# Patient Record
Sex: Female | Born: 1945
Health system: Southern US, Community
[De-identification: ages and names within clinical notes are randomized; demographics above are authoritative.]

## PROBLEM LIST (undated history)

## (undated) DIAGNOSIS — Q2730 Arteriovenous malformation, site unspecified: Secondary | ICD-10-CM

## (undated) DIAGNOSIS — I10 Essential (primary) hypertension: Secondary | ICD-10-CM

## (undated) DIAGNOSIS — R569 Unspecified convulsions: Secondary | ICD-10-CM

## (undated) DIAGNOSIS — M1990 Unspecified osteoarthritis, unspecified site: Secondary | ICD-10-CM

## (undated) DIAGNOSIS — I350 Nonrheumatic aortic (valve) stenosis: Secondary | ICD-10-CM

## (undated) DIAGNOSIS — IMO0002 Reserved for concepts with insufficient information to code with codable children: Secondary | ICD-10-CM

## (undated) DIAGNOSIS — T8859XA Other complications of anesthesia, initial encounter: Secondary | ICD-10-CM

## (undated) DIAGNOSIS — R011 Cardiac murmur, unspecified: Secondary | ICD-10-CM

## (undated) DIAGNOSIS — R87619 Unspecified abnormal cytological findings in specimens from cervix uteri: Secondary | ICD-10-CM

## (undated) DIAGNOSIS — D509 Iron deficiency anemia, unspecified: Secondary | ICD-10-CM

## (undated) DIAGNOSIS — B20 Human immunodeficiency virus [HIV] disease: Secondary | ICD-10-CM

## (undated) DIAGNOSIS — K219 Gastro-esophageal reflux disease without esophagitis: Secondary | ICD-10-CM

## (undated) DIAGNOSIS — T4145XA Adverse effect of unspecified anesthetic, initial encounter: Secondary | ICD-10-CM

## (undated) DIAGNOSIS — Z21 Asymptomatic human immunodeficiency virus [HIV] infection status: Secondary | ICD-10-CM

## (undated) DIAGNOSIS — R001 Bradycardia, unspecified: Secondary | ICD-10-CM

## (undated) DIAGNOSIS — Z950 Presence of cardiac pacemaker: Secondary | ICD-10-CM

## (undated) DIAGNOSIS — I119 Hypertensive heart disease without heart failure: Secondary | ICD-10-CM

## (undated) DIAGNOSIS — R768 Other specified abnormal immunological findings in serum: Secondary | ICD-10-CM

## (undated) DIAGNOSIS — I2510 Atherosclerotic heart disease of native coronary artery without angina pectoris: Secondary | ICD-10-CM

## (undated) DIAGNOSIS — I5189 Other ill-defined heart diseases: Secondary | ICD-10-CM

## (undated) DIAGNOSIS — R0781 Pleurodynia: Secondary | ICD-10-CM

## (undated) DIAGNOSIS — I8390 Asymptomatic varicose veins of unspecified lower extremity: Secondary | ICD-10-CM

## (undated) DIAGNOSIS — I509 Heart failure, unspecified: Secondary | ICD-10-CM

## (undated) HISTORY — DX: Asymptomatic human immunodeficiency virus (hiv) infection status: Z21

## (undated) HISTORY — DX: Cardiac murmur, unspecified: R01.1

## (undated) HISTORY — DX: Reserved for concepts with insufficient information to code with codable children: IMO0002

## (undated) HISTORY — DX: Unspecified abnormal cytological findings in specimens from cervix uteri: R87.619

## (undated) HISTORY — DX: Other ill-defined heart diseases: I51.89

## (undated) HISTORY — DX: Atherosclerotic heart disease of native coronary artery without angina pectoris: I25.10

## (undated) HISTORY — DX: Hypertensive heart disease without heart failure: I11.9

## (undated) HISTORY — DX: Presence of cardiac pacemaker: Z95.0

## (undated) HISTORY — DX: Unspecified osteoarthritis, unspecified site: M19.90

## (undated) HISTORY — PX: ABDOMINAL HYSTERECTOMY: SHX81

## (undated) HISTORY — DX: Asymptomatic varicose veins of unspecified lower extremity: I83.90

## (undated) HISTORY — DX: Heart failure, unspecified: I50.9

## (undated) HISTORY — DX: Bradycardia, unspecified: R00.1

## (undated) HISTORY — DX: Unspecified convulsions: R56.9

## (undated) HISTORY — DX: Essential (primary) hypertension: I10

## (undated) HISTORY — DX: Human immunodeficiency virus (HIV) disease: B20

## (undated) HISTORY — DX: Arteriovenous malformation, site unspecified: Q27.30

## (undated) HISTORY — DX: Nonrheumatic aortic (valve) stenosis: I35.0

---

## 1995-12-08 ENCOUNTER — Encounter (INDEPENDENT_AMBULATORY_CARE_PROVIDER_SITE_OTHER): Payer: Self-pay | Admitting: *Deleted

## 1995-12-08 LAB — CONVERTED CEMR LAB
CD4 Count: 530 microliters
CD4 T Cell Abs: 530

## 2001-01-28 ENCOUNTER — Ambulatory Visit (HOSPITAL_COMMUNITY): Admission: RE | Admit: 2001-01-28 | Discharge: 2001-01-28 | Payer: Self-pay | Admitting: Infectious Diseases

## 2001-01-28 ENCOUNTER — Encounter: Admission: RE | Admit: 2001-01-28 | Discharge: 2001-01-28 | Payer: Self-pay | Admitting: Infectious Diseases

## 2001-01-28 ENCOUNTER — Encounter: Payer: Self-pay | Admitting: Infectious Diseases

## 2001-02-18 ENCOUNTER — Encounter: Admission: RE | Admit: 2001-02-18 | Discharge: 2001-02-18 | Payer: Self-pay | Admitting: Infectious Diseases

## 2001-02-26 ENCOUNTER — Encounter: Admission: RE | Admit: 2001-02-26 | Discharge: 2001-02-26 | Payer: Self-pay | Admitting: Hematology and Oncology

## 2001-04-17 ENCOUNTER — Encounter: Admission: RE | Admit: 2001-04-17 | Discharge: 2001-04-17 | Payer: Self-pay | Admitting: Infectious Diseases

## 2001-05-18 ENCOUNTER — Encounter: Admission: RE | Admit: 2001-05-18 | Discharge: 2001-05-18 | Payer: Self-pay | Admitting: Infectious Diseases

## 2001-08-20 ENCOUNTER — Ambulatory Visit (HOSPITAL_COMMUNITY): Admission: RE | Admit: 2001-08-20 | Discharge: 2001-08-20 | Payer: Self-pay | Admitting: Infectious Diseases

## 2001-08-20 ENCOUNTER — Encounter: Admission: RE | Admit: 2001-08-20 | Discharge: 2001-08-20 | Payer: Self-pay | Admitting: Infectious Diseases

## 2001-09-02 ENCOUNTER — Encounter: Admission: RE | Admit: 2001-09-02 | Discharge: 2001-09-02 | Payer: Self-pay | Admitting: Infectious Diseases

## 2002-07-28 ENCOUNTER — Ambulatory Visit (HOSPITAL_COMMUNITY): Admission: RE | Admit: 2002-07-28 | Discharge: 2002-07-28 | Payer: Self-pay | Admitting: Infectious Diseases

## 2002-07-28 ENCOUNTER — Encounter: Admission: RE | Admit: 2002-07-28 | Discharge: 2002-07-28 | Payer: Self-pay | Admitting: Infectious Diseases

## 2002-10-27 ENCOUNTER — Encounter: Admission: RE | Admit: 2002-10-27 | Discharge: 2002-10-27 | Payer: Self-pay | Admitting: Infectious Diseases

## 2002-10-27 ENCOUNTER — Ambulatory Visit (HOSPITAL_COMMUNITY): Admission: RE | Admit: 2002-10-27 | Discharge: 2002-10-27 | Payer: Self-pay | Admitting: Infectious Diseases

## 2002-12-27 ENCOUNTER — Encounter: Payer: Self-pay | Admitting: Emergency Medicine

## 2002-12-27 ENCOUNTER — Emergency Department (HOSPITAL_COMMUNITY): Admission: EM | Admit: 2002-12-27 | Discharge: 2002-12-27 | Payer: Self-pay | Admitting: Emergency Medicine

## 2003-04-14 ENCOUNTER — Ambulatory Visit (HOSPITAL_COMMUNITY): Admission: RE | Admit: 2003-04-14 | Discharge: 2003-04-14 | Payer: Self-pay | Admitting: Internal Medicine

## 2003-04-14 ENCOUNTER — Encounter: Payer: Self-pay | Admitting: Infectious Diseases

## 2003-04-14 ENCOUNTER — Encounter: Admission: RE | Admit: 2003-04-14 | Discharge: 2003-04-14 | Payer: Self-pay | Admitting: Infectious Diseases

## 2003-04-14 ENCOUNTER — Encounter: Payer: Self-pay | Admitting: Internal Medicine

## 2003-04-20 ENCOUNTER — Encounter: Admission: RE | Admit: 2003-04-20 | Discharge: 2003-04-20 | Payer: Self-pay | Admitting: Infectious Diseases

## 2003-12-14 ENCOUNTER — Encounter: Admission: RE | Admit: 2003-12-14 | Discharge: 2003-12-14 | Payer: Self-pay | Admitting: Infectious Diseases

## 2003-12-14 ENCOUNTER — Encounter (INDEPENDENT_AMBULATORY_CARE_PROVIDER_SITE_OTHER): Payer: Self-pay | Admitting: *Deleted

## 2003-12-14 ENCOUNTER — Ambulatory Visit (HOSPITAL_COMMUNITY): Admission: RE | Admit: 2003-12-14 | Discharge: 2003-12-14 | Payer: Self-pay | Admitting: Infectious Diseases

## 2003-12-14 LAB — CONVERTED CEMR LAB
CD4 Count: 470 microliters
HIV 1 RNA Quant: 399 copies/mL

## 2006-01-29 ENCOUNTER — Ambulatory Visit: Payer: Self-pay | Admitting: Infectious Diseases

## 2006-01-29 ENCOUNTER — Encounter (INDEPENDENT_AMBULATORY_CARE_PROVIDER_SITE_OTHER): Payer: Self-pay | Admitting: *Deleted

## 2006-01-29 LAB — CONVERTED CEMR LAB
CD4 Count: 630 microliters
HIV 1 RNA Quant: 49 copies/mL

## 2006-04-30 ENCOUNTER — Encounter (INDEPENDENT_AMBULATORY_CARE_PROVIDER_SITE_OTHER): Payer: Self-pay | Admitting: *Deleted

## 2006-04-30 ENCOUNTER — Encounter: Admission: RE | Admit: 2006-04-30 | Discharge: 2006-04-30 | Payer: Self-pay | Admitting: Infectious Diseases

## 2006-04-30 ENCOUNTER — Ambulatory Visit: Payer: Self-pay | Admitting: Infectious Diseases

## 2006-04-30 LAB — CONVERTED CEMR LAB
CD4 Count: 620 microliters
HIV 1 RNA Quant: 399 copies/mL

## 2007-01-21 ENCOUNTER — Encounter: Payer: Self-pay | Admitting: Infectious Diseases

## 2007-01-23 DIAGNOSIS — B20 Human immunodeficiency virus [HIV] disease: Secondary | ICD-10-CM | POA: Insufficient documentation

## 2007-01-23 DIAGNOSIS — F17200 Nicotine dependence, unspecified, uncomplicated: Secondary | ICD-10-CM | POA: Insufficient documentation

## 2007-01-23 DIAGNOSIS — I10 Essential (primary) hypertension: Secondary | ICD-10-CM | POA: Insufficient documentation

## 2007-01-23 DIAGNOSIS — H268 Other specified cataract: Secondary | ICD-10-CM | POA: Insufficient documentation

## 2007-01-23 DIAGNOSIS — K219 Gastro-esophageal reflux disease without esophagitis: Secondary | ICD-10-CM | POA: Insufficient documentation

## 2007-01-23 DIAGNOSIS — G2581 Restless legs syndrome: Secondary | ICD-10-CM | POA: Insufficient documentation

## 2007-01-26 DIAGNOSIS — N879 Dysplasia of cervix uteri, unspecified: Secondary | ICD-10-CM | POA: Insufficient documentation

## 2007-01-26 DIAGNOSIS — Z9079 Acquired absence of other genital organ(s): Secondary | ICD-10-CM | POA: Insufficient documentation

## 2007-01-26 DIAGNOSIS — K029 Dental caries, unspecified: Secondary | ICD-10-CM | POA: Insufficient documentation

## 2007-01-26 DIAGNOSIS — B182 Chronic viral hepatitis C: Secondary | ICD-10-CM | POA: Insufficient documentation

## 2007-02-02 ENCOUNTER — Encounter (INDEPENDENT_AMBULATORY_CARE_PROVIDER_SITE_OTHER): Payer: Self-pay | Admitting: *Deleted

## 2007-02-02 LAB — CONVERTED CEMR LAB

## 2007-02-11 ENCOUNTER — Ambulatory Visit: Payer: Self-pay | Admitting: Infectious Diseases

## 2007-02-11 ENCOUNTER — Encounter: Admission: RE | Admit: 2007-02-11 | Discharge: 2007-02-11 | Payer: Self-pay | Admitting: Infectious Diseases

## 2007-02-11 DIAGNOSIS — IMO0001 Reserved for inherently not codable concepts without codable children: Secondary | ICD-10-CM | POA: Insufficient documentation

## 2007-02-11 DIAGNOSIS — R569 Unspecified convulsions: Secondary | ICD-10-CM | POA: Insufficient documentation

## 2007-02-12 ENCOUNTER — Telehealth: Payer: Self-pay | Admitting: Infectious Diseases

## 2007-02-12 ENCOUNTER — Telehealth (INDEPENDENT_AMBULATORY_CARE_PROVIDER_SITE_OTHER): Payer: Self-pay | Admitting: *Deleted

## 2007-02-15 ENCOUNTER — Encounter (INDEPENDENT_AMBULATORY_CARE_PROVIDER_SITE_OTHER): Payer: Self-pay | Admitting: *Deleted

## 2007-04-20 ENCOUNTER — Encounter: Payer: Self-pay | Admitting: Infectious Diseases

## 2007-04-21 ENCOUNTER — Telehealth: Payer: Self-pay | Admitting: Infectious Diseases

## 2007-05-14 ENCOUNTER — Encounter: Payer: Self-pay | Admitting: Infectious Diseases

## 2007-06-05 ENCOUNTER — Encounter: Admission: RE | Admit: 2007-06-05 | Discharge: 2007-06-05 | Payer: Self-pay | Admitting: Nephrology

## 2007-06-10 ENCOUNTER — Encounter: Payer: Self-pay | Admitting: Infectious Diseases

## 2007-06-17 ENCOUNTER — Encounter: Admission: RE | Admit: 2007-06-17 | Discharge: 2007-06-17 | Payer: Self-pay | Admitting: Infectious Diseases

## 2007-06-17 ENCOUNTER — Ambulatory Visit: Payer: Self-pay | Admitting: Infectious Diseases

## 2007-06-17 LAB — CONVERTED CEMR LAB
ALT: 24 units/L (ref 0–35)
AST: 32 units/L (ref 0–37)
Albumin: 3.9 g/dL (ref 3.5–5.2)
Alkaline Phosphatase: 121 units/L — ABNORMAL HIGH (ref 39–117)
BUN: 19 mg/dL (ref 6–23)
CO2: 25 meq/L (ref 19–32)
Calcium: 9.4 mg/dL (ref 8.4–10.5)
Chloride: 102 meq/L (ref 96–112)
Cholesterol: 153 mg/dL (ref 0–200)
Creatinine, Ser: 0.84 mg/dL (ref 0.40–1.20)
Glucose, Bld: 92 mg/dL (ref 70–99)
HCT: 43.8 % (ref 36.0–46.0)
HDL: 61 mg/dL (ref >39)
HIV 1 RNA Quant: 105 copies/mL — ABNORMAL HIGH (ref <50)
HIV-1 RNA Quant, Log: 2.02 — ABNORMAL HIGH (ref <1.70)
Hemoglobin: 14.7 g/dL (ref 12.0–15.0)
LDL Cholesterol: 75 mg/dL (ref 0–99)
MCHC: 33.6 g/dL (ref 30.0–36.0)
MCV: 110.9 fL — ABNORMAL HIGH (ref 78.0–100.0)
Platelets: 349 10*3/uL (ref 150–400)
Potassium: 3.8 meq/L (ref 3.5–5.3)
RBC: 3.95 M/uL (ref 3.87–5.11)
RDW: 15.8 % — ABNORMAL HIGH (ref 11.5–14.0)
Sodium: 137 meq/L (ref 135–145)
Total Bilirubin: 0.5 mg/dL (ref 0.3–1.2)
Total CHOL/HDL Ratio: 2.5
Total Protein: 9.5 g/dL — ABNORMAL HIGH (ref 6.0–8.3)
Triglycerides: 83 mg/dL (ref <150)
VLDL: 17 mg/dL (ref 0–40)
WBC: 7 10*3/uL (ref 4.0–10.5)

## 2007-06-18 ENCOUNTER — Encounter: Payer: Self-pay | Admitting: Infectious Diseases

## 2007-06-18 LAB — CONVERTED CEMR LAB: CD4 Count: 420 microliters

## 2007-06-25 ENCOUNTER — Telehealth: Payer: Self-pay | Admitting: Infectious Diseases

## 2007-07-08 ENCOUNTER — Encounter: Payer: Self-pay | Admitting: Infectious Diseases

## 2007-10-14 ENCOUNTER — Telehealth: Payer: Self-pay | Admitting: Infectious Diseases

## 2007-10-20 ENCOUNTER — Emergency Department (HOSPITAL_COMMUNITY): Admission: EM | Admit: 2007-10-20 | Discharge: 2007-10-20 | Payer: Self-pay | Admitting: *Deleted

## 2007-10-21 ENCOUNTER — Telehealth: Payer: Self-pay

## 2007-11-16 ENCOUNTER — Telehealth: Payer: Self-pay | Admitting: Infectious Diseases

## 2007-12-08 ENCOUNTER — Encounter: Payer: Self-pay | Admitting: Infectious Diseases

## 2007-12-14 ENCOUNTER — Encounter (INDEPENDENT_AMBULATORY_CARE_PROVIDER_SITE_OTHER): Payer: Self-pay | Admitting: Licensed Clinical Social Worker

## 2007-12-14 ENCOUNTER — Encounter: Admission: RE | Admit: 2007-12-14 | Discharge: 2007-12-14 | Payer: Self-pay | Admitting: Infectious Diseases

## 2007-12-14 ENCOUNTER — Ambulatory Visit: Payer: Self-pay | Admitting: Infectious Diseases

## 2007-12-14 LAB — CONVERTED CEMR LAB
HCV Ab: UNDETERMINED
HCV Quantitative: 1780000 intl units/mL — ABNORMAL HIGH (ref <5)
HIV 1 RNA Quant: 167 copies/mL — ABNORMAL HIGH (ref <50)
HIV-1 RNA Quant, Log: 2.22 — ABNORMAL HIGH (ref <1.70)

## 2007-12-15 ENCOUNTER — Encounter: Payer: Self-pay | Admitting: Infectious Diseases

## 2007-12-15 LAB — CONVERTED CEMR LAB
ALT: 20 units/L (ref 0–35)
AST: 20 units/L (ref 0–37)
Albumin: 3.8 g/dL (ref 3.5–5.2)
Alkaline Phosphatase: 97 units/L (ref 39–117)
BUN: 23 mg/dL (ref 6–23)
Basophils Absolute: 0 10*3/uL (ref 0.0–0.1)
Basophils Relative: 0 % (ref 0–1)
CO2: 22 meq/L (ref 19–32)
Calcium: 9.3 mg/dL (ref 8.4–10.5)
Chloride: 104 meq/L (ref 96–112)
Cholesterol: 147 mg/dL (ref 0–200)
Creatinine, Ser: 0.87 mg/dL (ref 0.40–1.20)
Eosinophils Absolute: 0.1 10*3/uL (ref 0.0–0.7)
Eosinophils Relative: 1 % (ref 0–5)
Glucose, Bld: 95 mg/dL (ref 70–99)
HCT: 41.5 % (ref 36.0–46.0)
HCV Ab: POSITIVE — AB
HDL: 59 mg/dL (ref >39)
Hemoglobin: 14.4 g/dL (ref 12.0–15.0)
Hep A Total Ab: POSITIVE — AB
LDL Cholesterol: 55 mg/dL (ref 0–99)
Lymphocytes Relative: 57 % — ABNORMAL HIGH (ref 12–46)
Lymphs Abs: 3.9 10*3/uL (ref 0.7–4.0)
MCHC: 34.7 g/dL (ref 30.0–36.0)
MCV: 108.1 fL — ABNORMAL HIGH (ref 78.0–100.0)
Monocytes Absolute: 0.5 10*3/uL (ref 0.1–1.0)
Monocytes Relative: 8 % (ref 3–12)
Neutro Abs: 2.3 10*3/uL (ref 1.7–7.7)
Neutrophils Relative %: 34 % — ABNORMAL LOW (ref 43–77)
Platelets: 302 10*3/uL (ref 150–400)
Potassium: 3.4 meq/L — ABNORMAL LOW (ref 3.5–5.3)
RBC: 3.84 M/uL — ABNORMAL LOW (ref 3.87–5.11)
RDW: 14.4 % (ref 11.5–15.5)
Sodium: 137 meq/L (ref 135–145)
Total Bilirubin: 0.5 mg/dL (ref 0.3–1.2)
Total CHOL/HDL Ratio: 2.5
Total Protein: 8.7 g/dL — ABNORMAL HIGH (ref 6.0–8.3)
Triglycerides: 165 mg/dL — ABNORMAL HIGH (ref <150)
VLDL: 33 mg/dL (ref 0–40)
WBC: 6.8 10*3/uL (ref 4.0–10.5)

## 2007-12-16 ENCOUNTER — Telehealth: Payer: Self-pay | Admitting: Infectious Diseases

## 2008-02-16 ENCOUNTER — Telehealth: Payer: Self-pay | Admitting: Infectious Diseases

## 2008-02-17 ENCOUNTER — Encounter: Payer: Self-pay | Admitting: Infectious Diseases

## 2008-03-04 ENCOUNTER — Encounter: Payer: Self-pay | Admitting: Infectious Diseases

## 2008-03-04 ENCOUNTER — Ambulatory Visit: Payer: Self-pay | Admitting: Infectious Diseases

## 2008-03-04 DIAGNOSIS — IMO0002 Reserved for concepts with insufficient information to code with codable children: Secondary | ICD-10-CM | POA: Insufficient documentation

## 2008-03-14 LAB — CONVERTED CEMR LAB: Pap Smear: ABNORMAL

## 2008-03-16 ENCOUNTER — Encounter: Payer: Self-pay | Admitting: Infectious Diseases

## 2008-03-31 ENCOUNTER — Encounter: Payer: Self-pay | Admitting: Infectious Diseases

## 2008-04-07 ENCOUNTER — Encounter: Payer: Self-pay | Admitting: Infectious Diseases

## 2008-04-08 ENCOUNTER — Encounter: Admission: RE | Admit: 2008-04-08 | Discharge: 2008-04-08 | Payer: Self-pay | Admitting: Infectious Diseases

## 2008-04-08 ENCOUNTER — Ambulatory Visit: Payer: Self-pay | Admitting: Infectious Diseases

## 2008-04-08 ENCOUNTER — Ambulatory Visit: Payer: Self-pay | Admitting: Gynecology

## 2008-04-08 LAB — CONVERTED CEMR LAB
ALT: 31 units/L (ref 0–35)
AST: 33 units/L (ref 0–37)
Albumin: 3.6 g/dL (ref 3.5–5.2)
Alkaline Phosphatase: 119 units/L — ABNORMAL HIGH (ref 39–117)
BUN: 16 mg/dL (ref 6–23)
Basophils Absolute: 0 10*3/uL (ref 0.0–0.1)
Basophils Relative: 0 % (ref 0–1)
CO2: 25 meq/L (ref 19–32)
Calcium: 8.7 mg/dL (ref 8.4–10.5)
Chloride: 103 meq/L (ref 96–112)
Cholesterol: 128 mg/dL (ref 0–200)
Creatinine, Ser: 0.71 mg/dL (ref 0.40–1.20)
Eosinophils Absolute: 0 10*3/uL (ref 0.0–0.7)
Eosinophils Relative: 1 % (ref 0–5)
Glucose, Bld: 93 mg/dL (ref 70–99)
HCT: 38 % (ref 36.0–46.0)
HDL: 56 mg/dL (ref >39)
HIV 1 RNA Quant: 183 copies/mL — ABNORMAL HIGH (ref <50)
HIV-1 RNA Quant, Log: 2.26 — ABNORMAL HIGH (ref <1.70)
Hemoglobin: 12.4 g/dL (ref 12.0–15.0)
LDL Cholesterol: 57 mg/dL (ref 0–99)
Lymphocytes Relative: 50 % — ABNORMAL HIGH (ref 12–46)
Lymphs Abs: 4 10*3/uL (ref 0.7–4.0)
MCHC: 32.6 g/dL (ref 30.0–36.0)
MCV: 111.1 fL — ABNORMAL HIGH (ref 78.0–100.0)
Monocytes Absolute: 0.7 10*3/uL (ref 0.1–1.0)
Monocytes Relative: 9 % (ref 3–12)
Neutro Abs: 3.2 10*3/uL (ref 1.7–7.7)
Neutrophils Relative %: 41 % — ABNORMAL LOW (ref 43–77)
Platelets: 307 10*3/uL (ref 150–400)
Potassium: 3.8 meq/L (ref 3.5–5.3)
RBC: 3.42 M/uL — ABNORMAL LOW (ref 3.87–5.11)
RDW: 14.1 % (ref 11.5–15.5)
Sodium: 139 meq/L (ref 135–145)
Total Bilirubin: 0.8 mg/dL (ref 0.3–1.2)
Total CHOL/HDL Ratio: 2.3
Total Protein: 8.6 g/dL — ABNORMAL HIGH (ref 6.0–8.3)
Triglycerides: 75 mg/dL (ref <150)
VLDL: 15 mg/dL (ref 0–40)
WBC: 8 10*3/uL (ref 4.0–10.5)

## 2008-09-05 ENCOUNTER — Telehealth (INDEPENDENT_AMBULATORY_CARE_PROVIDER_SITE_OTHER): Payer: Self-pay | Admitting: *Deleted

## 2008-09-26 ENCOUNTER — Ambulatory Visit: Payer: Self-pay | Admitting: Infectious Diseases

## 2008-09-26 LAB — CONVERTED CEMR LAB
ALT: 19 units/L (ref 0–35)
AST: 25 units/L (ref 0–37)
Albumin: 3.7 g/dL (ref 3.5–5.2)
Alkaline Phosphatase: 109 units/L (ref 39–117)
BUN: 16 mg/dL (ref 6–23)
Basophils Absolute: 0 10*3/uL (ref 0.0–0.1)
Basophils Relative: 0 % (ref 0–1)
CO2: 23 meq/L (ref 19–32)
Calcium: 8.7 mg/dL (ref 8.4–10.5)
Chloride: 109 meq/L (ref 96–112)
Creatinine, Ser: 1.05 mg/dL (ref 0.40–1.20)
Eosinophils Absolute: 0.1 10*3/uL (ref 0.0–0.7)
Eosinophils Relative: 1 % (ref 0–5)
Glucose, Bld: 104 mg/dL — ABNORMAL HIGH (ref 70–99)
HCT: 37.5 % (ref 36.0–46.0)
HIV 1 RNA Quant: 192 copies/mL — ABNORMAL HIGH (ref <50)
HIV-1 RNA Quant, Log: 2.28 — ABNORMAL HIGH (ref <1.70)
Hemoglobin: 11.7 g/dL — ABNORMAL LOW (ref 12.0–15.0)
Lymphocytes Relative: 58 % — ABNORMAL HIGH (ref 12–46)
Lymphs Abs: 3.8 10*3/uL (ref 0.7–4.0)
MCHC: 31.2 g/dL (ref 30.0–36.0)
MCV: 98.4 fL (ref 78.0–100.0)
Monocytes Absolute: 0.5 10*3/uL (ref 0.1–1.0)
Monocytes Relative: 7 % (ref 3–12)
Neutro Abs: 2.2 10*3/uL (ref 1.7–7.7)
Neutrophils Relative %: 34 % — ABNORMAL LOW (ref 43–77)
Platelets: 369 10*3/uL (ref 150–400)
Potassium: 3.9 meq/L (ref 3.5–5.3)
RBC: 3.81 M/uL — ABNORMAL LOW (ref 3.87–5.11)
RDW: 16 % — ABNORMAL HIGH (ref 11.5–15.5)
Sodium: 143 meq/L (ref 135–145)
Total Bilirubin: 0.4 mg/dL (ref 0.3–1.2)
Total Protein: 8.4 g/dL — ABNORMAL HIGH (ref 6.0–8.3)
WBC: 6.5 10*3/uL (ref 4.0–10.5)

## 2008-10-10 ENCOUNTER — Ambulatory Visit: Payer: Self-pay | Admitting: Infectious Diseases

## 2008-11-08 ENCOUNTER — Encounter: Payer: Self-pay | Admitting: Infectious Diseases

## 2008-11-16 ENCOUNTER — Telehealth: Payer: Self-pay | Admitting: Infectious Diseases

## 2008-12-28 ENCOUNTER — Telehealth: Payer: Self-pay

## 2009-03-27 ENCOUNTER — Telehealth: Payer: Self-pay

## 2009-06-05 ENCOUNTER — Ambulatory Visit: Payer: Self-pay | Admitting: Internal Medicine

## 2009-06-05 ENCOUNTER — Encounter: Payer: Self-pay | Admitting: Infectious Diseases

## 2009-06-05 LAB — CONVERTED CEMR LAB
ALT: 12 units/L (ref 0–35)
AST: 20 units/L (ref 0–37)
Albumin: 3.6 g/dL (ref 3.5–5.2)
Alkaline Phosphatase: 85 units/L (ref 39–117)
BUN: 16 mg/dL (ref 6–23)
Basophils Absolute: 0 10*3/uL (ref 0.0–0.1)
Basophils Relative: 0 % (ref 0–1)
CO2: 24 meq/L (ref 19–32)
Calcium: 9 mg/dL (ref 8.4–10.5)
Chloride: 107 meq/L (ref 96–112)
Cholesterol: 130 mg/dL (ref 0–200)
Creatinine, Ser: 0.74 mg/dL (ref 0.40–1.20)
Eosinophils Absolute: 0.1 10*3/uL (ref 0.0–0.7)
Eosinophils Relative: 1 % (ref 0–5)
Glucose, Bld: 101 mg/dL — ABNORMAL HIGH (ref 70–99)
HCT: 33.9 % — ABNORMAL LOW (ref 36.0–46.0)
HDL: 67 mg/dL (ref >39)
HIV 1 RNA Quant: 275 copies/mL — ABNORMAL HIGH (ref <48)
HIV-1 RNA Quant, Log: 2.44 — ABNORMAL HIGH (ref <1.68)
Hemoglobin: 10.7 g/dL — ABNORMAL LOW (ref 12.0–15.0)
LDL Cholesterol: 54 mg/dL (ref 0–99)
Lymphocytes Relative: 60 % — ABNORMAL HIGH (ref 12–46)
Lymphs Abs: 3.5 10*3/uL (ref 0.7–4.0)
MCHC: 31.6 g/dL (ref 30.0–36.0)
MCV: 88.1 fL (ref 78.0–100.0)
Monocytes Absolute: 0.5 10*3/uL (ref 0.1–1.0)
Monocytes Relative: 8 % (ref 3–12)
Neutro Abs: 1.8 10*3/uL (ref 1.7–7.7)
Neutrophils Relative %: 31 % — ABNORMAL LOW (ref 43–77)
Platelets: 313 10*3/uL (ref 150–400)
Potassium: 4 meq/L (ref 3.5–5.3)
RBC: 3.85 M/uL — ABNORMAL LOW (ref 3.87–5.11)
RDW: 18.5 % — ABNORMAL HIGH (ref 11.5–15.5)
Sodium: 140 meq/L (ref 135–145)
Total Bilirubin: 0.4 mg/dL (ref 0.3–1.2)
Total CHOL/HDL Ratio: 1.9
Total Protein: 8.1 g/dL (ref 6.0–8.3)
Triglycerides: 45 mg/dL (ref <150)
VLDL: 9 mg/dL (ref 0–40)
WBC: 5.7 10*3/uL (ref 4.0–10.5)

## 2009-06-19 ENCOUNTER — Ambulatory Visit: Payer: Self-pay | Admitting: Infectious Diseases

## 2009-06-19 ENCOUNTER — Telehealth (INDEPENDENT_AMBULATORY_CARE_PROVIDER_SITE_OTHER): Payer: Self-pay | Admitting: *Deleted

## 2009-06-21 ENCOUNTER — Ambulatory Visit: Payer: Self-pay | Admitting: Infectious Diseases

## 2009-06-22 ENCOUNTER — Encounter: Payer: Self-pay | Admitting: Infectious Diseases

## 2009-06-26 ENCOUNTER — Telehealth: Payer: Self-pay | Admitting: Infectious Diseases

## 2009-07-14 ENCOUNTER — Telehealth: Payer: Self-pay | Admitting: Infectious Diseases

## 2009-09-12 ENCOUNTER — Encounter: Payer: Self-pay | Admitting: Infectious Diseases

## 2009-09-15 ENCOUNTER — Telehealth: Payer: Self-pay | Admitting: Infectious Diseases

## 2009-10-02 ENCOUNTER — Ambulatory Visit: Payer: Self-pay | Admitting: Infectious Diseases

## 2009-10-02 LAB — CONVERTED CEMR LAB
ALT: 17 units/L (ref 0–35)
AST: 23 units/L (ref 0–37)
Albumin: 3.5 g/dL (ref 3.5–5.2)
Alkaline Phosphatase: 89 units/L (ref 39–117)
BUN: 20 mg/dL (ref 6–23)
Basophils Absolute: 0 10*3/uL (ref 0.0–0.1)
Basophils Relative: 0 % (ref 0–1)
CO2: 26 meq/L (ref 19–32)
Calcium: 9.2 mg/dL (ref 8.4–10.5)
Chloride: 104 meq/L (ref 96–112)
Creatinine, Ser: 0.77 mg/dL (ref 0.40–1.20)
Eosinophils Absolute: 0.1 10*3/uL (ref 0.0–0.7)
Eosinophils Relative: 2 % (ref 0–5)
Glucose, Bld: 92 mg/dL (ref 70–99)
HCT: 34.3 % — ABNORMAL LOW (ref 36.0–46.0)
HIV 1 RNA Quant: 369 copies/mL — ABNORMAL HIGH (ref <48)
HIV-1 RNA Quant, Log: 2.57 — ABNORMAL HIGH (ref <1.68)
Hemoglobin: 10.7 g/dL — ABNORMAL LOW (ref 12.0–15.0)
Lymphocytes Relative: 46 % (ref 12–46)
Lymphs Abs: 3.1 10*3/uL (ref 0.7–4.0)
MCHC: 31.2 g/dL (ref 30.0–36.0)
MCV: 97.2 fL (ref >=78.0)
Monocytes Absolute: 0.5 10*3/uL (ref 0.1–1.0)
Monocytes Relative: 7 % (ref 3–12)
Neutro Abs: 3.1 10*3/uL (ref 1.7–7.7)
Neutrophils Relative %: 45 % (ref 43–77)
Platelets: 374 10*3/uL (ref 150–400)
Potassium: 3.9 meq/L (ref 3.5–5.3)
RBC: 3.53 M/uL — ABNORMAL LOW (ref 3.87–5.11)
RDW: 17.2 % — ABNORMAL HIGH (ref 11.5–15.5)
Sodium: 138 meq/L (ref 135–145)
Total Bilirubin: 0.2 mg/dL — ABNORMAL LOW (ref 0.3–1.2)
Total Protein: 7.9 g/dL (ref 6.0–8.3)
WBC: 6.8 10*3/uL (ref 4.0–10.5)

## 2010-01-01 ENCOUNTER — Ambulatory Visit: Payer: Self-pay | Admitting: Infectious Diseases

## 2010-01-01 LAB — CONVERTED CEMR LAB
ALT: 19 U/L
AST: 26 U/L
Albumin: 4 g/dL
Alkaline Phosphatase: 91 U/L
BUN: 15 mg/dL
Basophils Absolute: 0 10*3/uL
Basophils Relative: 0 %
CO2: 28 meq/L
Calcium: 9.1 mg/dL
Chlamydia, Swab/Urine, PCR: NEGATIVE
Chloride: 101 meq/L
Cholesterol: 130 mg/dL
Creatinine, Ser: 0.69 mg/dL
Eosinophils Absolute: 0.2 10*3/uL
Eosinophils Relative: 3 %
GC Probe Amp, Urine: NEGATIVE
Glucose, Bld: 88 mg/dL
HCT: 39.5 %
HDL: 61 mg/dL
HIV 1 RNA Quant: 211 {copies}/mL — ABNORMAL HIGH
HIV-1 RNA Quant, Log: 2.32 — ABNORMAL HIGH
Hemoglobin: 11.9 g/dL — ABNORMAL LOW
LDL Cholesterol: 54 mg/dL
Lymphocytes Relative: 59 % — ABNORMAL HIGH
Lymphs Abs: 4.4 10*3/uL — ABNORMAL HIGH
MCHC: 30.1 g/dL
MCV: 94.5 fL
Monocytes Absolute: 0.6 10*3/uL
Monocytes Relative: 8 %
Neutro Abs: 2.3 10*3/uL
Neutrophils Relative %: 30 % — ABNORMAL LOW
Platelets: 359 10*3/uL
Potassium: 4 meq/L
RBC: 4.18 M/uL
RDW: 17.6 % — ABNORMAL HIGH
Sodium: 135 meq/L
Total Bilirubin: 0.3 mg/dL
Total CHOL/HDL Ratio: 2.1
Total Protein: 9 g/dL — ABNORMAL HIGH
Triglycerides: 73 mg/dL
VLDL: 15 mg/dL
WBC: 7.6 10*3/uL

## 2010-01-25 ENCOUNTER — Ambulatory Visit: Payer: Self-pay | Admitting: Infectious Diseases

## 2010-05-08 ENCOUNTER — Telehealth (INDEPENDENT_AMBULATORY_CARE_PROVIDER_SITE_OTHER): Payer: Self-pay | Admitting: *Deleted

## 2010-06-06 ENCOUNTER — Encounter: Payer: Self-pay | Admitting: Infectious Diseases

## 2010-07-16 ENCOUNTER — Ambulatory Visit: Payer: Self-pay | Admitting: Infectious Diseases

## 2010-07-16 LAB — CONVERTED CEMR LAB
ALT: 21 units/L (ref 0–35)
AST: 33 units/L (ref 0–37)
Albumin: 3.6 g/dL (ref 3.5–5.2)
Alkaline Phosphatase: 91 units/L (ref 39–117)
BUN: 18 mg/dL (ref 6–23)
Basophils Absolute: 0 10*3/uL (ref 0.0–0.1)
Basophils Relative: 0 % (ref 0–1)
CO2: 22 meq/L (ref 19–32)
Calcium: 8.8 mg/dL (ref 8.4–10.5)
Chloride: 105 meq/L (ref 96–112)
Creatinine, Ser: 0.9 mg/dL (ref 0.40–1.20)
Eosinophils Absolute: 0.1 10*3/uL (ref 0.0–0.7)
Eosinophils Relative: 2 % (ref 0–5)
Glucose, Bld: 101 mg/dL — ABNORMAL HIGH (ref 70–99)
HCT: 33.5 % — ABNORMAL LOW (ref 36.0–46.0)
HIV 1 RNA Quant: 767 copies/mL — ABNORMAL HIGH (ref <48)
HIV-1 RNA Quant, Log: 2.88 — ABNORMAL HIGH (ref <1.68)
Hemoglobin: 9.8 g/dL — ABNORMAL LOW (ref 12.0–15.0)
Lymphocytes Relative: 59 % — ABNORMAL HIGH (ref 12–46)
Lymphs Abs: 4.4 10*3/uL — ABNORMAL HIGH (ref 0.7–4.0)
MCHC: 29.3 g/dL — ABNORMAL LOW (ref 30.0–36.0)
MCV: 79.8 fL (ref 78.0–100.0)
Monocytes Absolute: 0.5 10*3/uL (ref 0.1–1.0)
Monocytes Relative: 7 % (ref 3–12)
Neutro Abs: 2.5 10*3/uL (ref 1.7–7.7)
Neutrophils Relative %: 33 % — ABNORMAL LOW (ref 43–77)
Platelets: 399 10*3/uL (ref 150–400)
Potassium: 3.6 meq/L (ref 3.5–5.3)
RBC: 4.2 M/uL (ref 3.87–5.11)
RDW: 18.7 % — ABNORMAL HIGH (ref 11.5–15.5)
Sodium: 137 meq/L (ref 135–145)
Total Bilirubin: 0.2 mg/dL — ABNORMAL LOW (ref 0.3–1.2)
Total Protein: 8.4 g/dL — ABNORMAL HIGH (ref 6.0–8.3)
WBC: 7.5 10*3/uL (ref 4.0–10.5)

## 2010-08-15 ENCOUNTER — Encounter: Payer: Self-pay | Admitting: Infectious Diseases

## 2010-08-27 ENCOUNTER — Ambulatory Visit: Payer: Self-pay | Admitting: Infectious Diseases

## 2010-10-15 ENCOUNTER — Encounter: Payer: Self-pay | Admitting: Infectious Diseases

## 2010-12-11 ENCOUNTER — Encounter: Payer: Self-pay | Admitting: Infectious Diseases

## 2010-12-14 ENCOUNTER — Ambulatory Visit
Admission: RE | Admit: 2010-12-14 | Discharge: 2010-12-14 | Payer: Self-pay | Source: Home / Self Care | Attending: Infectious Diseases | Admitting: Infectious Diseases

## 2010-12-14 ENCOUNTER — Encounter: Payer: Self-pay | Admitting: Infectious Diseases

## 2010-12-14 LAB — CONVERTED CEMR LAB
ALT: 18 units/L (ref 0–35)
AST: 26 units/L (ref 0–37)
Albumin: 3.8 g/dL (ref 3.5–5.2)
Alkaline Phosphatase: 110 units/L (ref 39–117)
BUN: 14 mg/dL (ref 6–23)
Basophils Absolute: 0 10*3/uL (ref 0.0–0.1)
Basophils Relative: 0 % (ref 0–1)
CO2: 28 meq/L (ref 19–32)
Calcium: 9.3 mg/dL (ref 8.4–10.5)
Chloride: 101 meq/L (ref 96–112)
Creatinine, Ser: 0.74 mg/dL (ref 0.40–1.20)
Eosinophils Absolute: 0.1 10*3/uL (ref 0.0–0.7)
Eosinophils Relative: 1 % (ref 0–5)
Glucose, Bld: 99 mg/dL (ref 70–99)
HCT: 33.8 % — ABNORMAL LOW (ref 36.0–46.0)
HIV 1 RNA Quant: 762 copies/mL — ABNORMAL HIGH (ref <20)
HIV-1 RNA Quant, Log: 2.88 — ABNORMAL HIGH (ref <1.30)
Hemoglobin: 10.1 g/dL — ABNORMAL LOW (ref 12.0–15.0)
Lymphocytes Relative: 55 % — ABNORMAL HIGH (ref 12–46)
Lymphs Abs: 4.4 10*3/uL — ABNORMAL HIGH (ref 0.7–4.0)
MCHC: 29.9 g/dL — ABNORMAL LOW (ref 30.0–36.0)
MCV: 85.4 fL (ref 78.0–100.0)
Monocytes Absolute: 0.7 10*3/uL (ref 0.1–1.0)
Monocytes Relative: 8 % (ref 3–12)
Neutro Abs: 2.8 10*3/uL (ref 1.7–7.7)
Neutrophils Relative %: 35 % — ABNORMAL LOW (ref 43–77)
Platelets: 331 10*3/uL (ref 150–400)
Potassium: 4.5 meq/L (ref 3.5–5.3)
RBC: 3.96 M/uL (ref 3.87–5.11)
RDW: 20.7 % — ABNORMAL HIGH (ref 11.5–15.5)
Sodium: 137 meq/L (ref 135–145)
Total Bilirubin: 0.3 mg/dL (ref 0.3–1.2)
Total Protein: 9 g/dL — ABNORMAL HIGH (ref 6.0–8.3)
WBC: 7.9 10*3/uL (ref 4.0–10.5)

## 2010-12-14 LAB — T-HELPER CELL (CD4) - (RCID CLINIC ONLY)
CD4 % Helper T Cell: 12 % — ABNORMAL LOW (ref 33–55)
CD4 T Cell Abs: 490 uL (ref 400–2700)

## 2010-12-25 ENCOUNTER — Inpatient Hospital Stay (HOSPITAL_COMMUNITY)
Admission: AD | Admit: 2010-12-25 | Discharge: 2010-12-27 | Payer: Self-pay | Source: Home / Self Care | Attending: Internal Medicine | Admitting: Internal Medicine

## 2010-12-25 ENCOUNTER — Encounter: Payer: Self-pay | Admitting: Internal Medicine

## 2010-12-25 ENCOUNTER — Encounter: Payer: Self-pay | Admitting: Infectious Diseases

## 2010-12-25 ENCOUNTER — Ambulatory Visit
Admission: RE | Admit: 2010-12-25 | Discharge: 2010-12-25 | Payer: Self-pay | Source: Home / Self Care | Attending: Infectious Diseases | Admitting: Infectious Diseases

## 2010-12-25 DIAGNOSIS — I10 Essential (primary) hypertension: Secondary | ICD-10-CM | POA: Insufficient documentation

## 2010-12-26 LAB — IRON AND TIBC
Iron: 22 ug/dL — ABNORMAL LOW (ref 42–135)
UIBC: >450 ug/dL

## 2010-12-26 LAB — COMPREHENSIVE METABOLIC PANEL
ALT: 25 U/L (ref 0–35)
AST: 35 U/L (ref 0–37)
Albumin: 3.2 g/dL — ABNORMAL LOW (ref 3.5–5.2)
Alkaline Phosphatase: 98 U/L (ref 39–117)
BUN: 11 mg/dL (ref 6–23)
CO2: 28 mEq/L (ref 19–32)
Calcium: 8.9 mg/dL (ref 8.4–10.5)
Chloride: 102 mEq/L (ref 96–112)
Creatinine, Ser: 0.64 mg/dL (ref 0.4–1.2)
GFR calc Af Amer: >60 mL/min (ref >60)
GFR calc non Af Amer: >60 mL/min (ref >60)
Glucose, Bld: 94 mg/dL (ref 70–99)
Potassium: 3.5 mEq/L (ref 3.5–5.1)
Sodium: 136 mEq/L (ref 135–145)
Total Bilirubin: 0.2 mg/dL — ABNORMAL LOW (ref 0.3–1.2)
Total Protein: 8.7 g/dL — ABNORMAL HIGH (ref 6.0–8.3)

## 2010-12-26 LAB — DIFFERENTIAL
Basophils Absolute: 0 10*3/uL (ref 0.0–0.1)
Basophils Relative: 0 % (ref 0–1)
Eosinophils Absolute: 0.1 10*3/uL (ref 0.0–0.7)
Eosinophils Relative: 1 % (ref 0–5)
Lymphocytes Relative: 53 % — ABNORMAL HIGH (ref 12–46)
Lymphs Abs: 3.6 10*3/uL (ref 0.7–4.0)
Monocytes Absolute: 0.6 10*3/uL (ref 0.1–1.0)
Monocytes Relative: 9 % (ref 3–12)
Neutro Abs: 2.5 10*3/uL (ref 1.7–7.7)
Neutrophils Relative %: 36 % — ABNORMAL LOW (ref 43–77)

## 2010-12-26 LAB — CARDIAC PANEL(CRET KIN+CKTOT+MB+TROPI)
CK, MB: 2 ng/mL (ref 0.3–4.0)
CK, MB: 1.6 ng/mL (ref 0.3–4.0)
Relative Index: 1.8 (ref 0.0–2.5)
Relative Index: INVALID (ref 0.0–2.5)
Total CK: 111 U/L (ref 7–177)
Total CK: 94 U/L (ref 7–177)
Troponin I: 0.03 ng/mL (ref 0.00–0.06)
Troponin I: 0.02 ng/mL (ref 0.00–0.06)

## 2010-12-26 LAB — TSH: TSH: 0.695 u[IU]/mL (ref 0.350–4.500)

## 2010-12-26 LAB — CBC
HCT: 34.6 % — ABNORMAL LOW (ref 36.0–46.0)
HCT: 32.8 % — ABNORMAL LOW (ref 36.0–46.0)
Hemoglobin: 10.5 g/dL — ABNORMAL LOW (ref 12.0–15.0)
Hemoglobin: 10.2 g/dL — ABNORMAL LOW (ref 12.0–15.0)
MCH: 25.3 pg — ABNORMAL LOW (ref 26.0–34.0)
MCH: 25.8 pg — ABNORMAL LOW (ref 26.0–34.0)
MCHC: 30.3 g/dL (ref 30.0–36.0)
MCHC: 31.1 g/dL (ref 30.0–36.0)
MCV: 83.4 fL (ref 78.0–100.0)
MCV: 82.8 fL (ref 78.0–100.0)
Platelets: 310 10*3/uL (ref 150–400)
Platelets: 286 10*3/uL (ref 150–400)
RBC: 4.15 MIL/uL (ref 3.87–5.11)
RBC: 3.96 MIL/uL (ref 3.87–5.11)
RDW: 19.4 % — ABNORMAL HIGH (ref 11.5–15.5)
RDW: 19.5 % — ABNORMAL HIGH (ref 11.5–15.5)
WBC: 6.8 10*3/uL (ref 4.0–10.5)
WBC: 6.8 10*3/uL (ref 4.0–10.5)

## 2010-12-26 LAB — FOLATE: Folate: >20 ng/mL

## 2010-12-26 LAB — LIPID PANEL
Cholesterol: 122 mg/dL (ref 0–200)
HDL: 49 mg/dL (ref >39)
LDL Cholesterol: 64 mg/dL (ref 0–99)
Total CHOL/HDL Ratio: 2.5 RATIO
Triglycerides: 47 mg/dL (ref <150)
VLDL: 9 mg/dL (ref 0–40)

## 2010-12-26 LAB — FERRITIN: Ferritin: 8 ng/mL — ABNORMAL LOW (ref 10–291)

## 2010-12-26 LAB — VITAMIN B12: Vitamin B-12: 349 pg/mL (ref 211–911)

## 2010-12-27 ENCOUNTER — Encounter: Payer: Self-pay | Admitting: Internal Medicine

## 2010-12-30 ENCOUNTER — Encounter: Payer: Self-pay | Admitting: Infectious Diseases

## 2010-12-31 LAB — BASIC METABOLIC PANEL
BUN: 16 mg/dL (ref 6–23)
BUN: 14 mg/dL (ref 6–23)
CO2: 27 mEq/L (ref 19–32)
CO2: 27 mEq/L (ref 19–32)
Calcium: 8.3 mg/dL — ABNORMAL LOW (ref 8.4–10.5)
Calcium: 8.4 mg/dL (ref 8.4–10.5)
Chloride: 102 mEq/L (ref 96–112)
Chloride: 105 mEq/L (ref 96–112)
Creatinine, Ser: 0.69 mg/dL (ref 0.4–1.2)
Creatinine, Ser: 0.74 mg/dL (ref 0.4–1.2)
GFR calc Af Amer: >60 mL/min (ref >60)
GFR calc Af Amer: >60 mL/min (ref >60)
GFR calc non Af Amer: >60 mL/min (ref >60)
GFR calc non Af Amer: >60 mL/min (ref >60)
Glucose, Bld: 129 mg/dL — ABNORMAL HIGH (ref 70–99)
Glucose, Bld: 114 mg/dL — ABNORMAL HIGH (ref 70–99)
Potassium: 3.5 mEq/L (ref 3.5–5.1)
Potassium: 3.6 mEq/L (ref 3.5–5.1)
Sodium: 134 mEq/L — ABNORMAL LOW (ref 135–145)
Sodium: 137 mEq/L (ref 135–145)

## 2010-12-31 LAB — CBC
HCT: 31.3 % — ABNORMAL LOW (ref 36.0–46.0)
Hemoglobin: 9.5 g/dL — ABNORMAL LOW (ref 12.0–15.0)
MCH: 25.3 pg — ABNORMAL LOW (ref 26.0–34.0)
MCHC: 30.4 g/dL (ref 30.0–36.0)
MCV: 83.2 fL (ref 78.0–100.0)
Platelets: 302 10*3/uL (ref 150–400)
RBC: 3.76 MIL/uL — ABNORMAL LOW (ref 3.87–5.11)
RDW: 19.7 % — ABNORMAL HIGH (ref 11.5–15.5)
WBC: 7.1 10*3/uL (ref 4.0–10.5)

## 2010-12-31 LAB — DRUGS OF ABUSE SCREEN W/O ALC, ROUTINE URINE
Amphetamine Screen, Ur: NEGATIVE
Barbiturate Quant, Ur: NEGATIVE
Benzodiazepines.: NEGATIVE
Cocaine Metabolites: NEGATIVE
Creatinine,U: 43.6 mg/dL
Marijuana Metabolite: NEGATIVE
Methadone: NEGATIVE
Opiate Screen, Urine: NEGATIVE
Phencyclidine (PCP): NEGATIVE
Propoxyphene: NEGATIVE

## 2010-12-31 LAB — URINALYSIS, ROUTINE W REFLEX MICROSCOPIC
Bilirubin Urine: NEGATIVE
Hgb urine dipstick: NEGATIVE
Ketones, ur: NEGATIVE mg/dL
Nitrite: NEGATIVE
Protein, ur: NEGATIVE mg/dL
Specific Gravity, Urine: >1.046 — ABNORMAL HIGH (ref 1.005–1.030)
Urine Glucose, Fasting: NEGATIVE mg/dL
Urobilinogen, UA: 1 mg/dL (ref 0.0–1.0)
pH: 7 (ref 5.0–8.0)

## 2011-01-02 ENCOUNTER — Ambulatory Visit
Admission: RE | Admit: 2011-01-02 | Discharge: 2011-01-02 | Payer: Self-pay | Source: Home / Self Care | Attending: Infectious Diseases | Admitting: Infectious Diseases

## 2011-01-04 NOTE — Discharge Summary (Signed)
NAMEBROOKLYN, Nicole Mccormick                  ACCOUNT NO.:  [id number]  MEDICAL RECORD NO.:  [id number]          PATIENT TYPE:  INP  LOCATION:  5526                         FACILITY:  [facility]  PHYSICIAN:  Tilford Pillar, MD     DATE OF BIRTH:  Feb 18, 1946  DATE OF ADMISSION:  12/25/2010 DATE OF DISCHARGE:  12/27/2010                              DISCHARGE SUMMARY   ATTENDING PHYSICIAN:  Tilford Pillar, MD  DISCHARGE DIAGNOSES: 1. Hypertension. 2. Anemia. 3. Human immunodeficiency virus. 4. Restless legs syndrome. 5. Reactive airway disease.  DISPOSITION AND FOLLOWUP:  [name] was discharged from [facility] in stable and improved condition.  She will need to follow up with Dr. Ninetta Lights on January 02, 2011 at 10 a.m. as a followup for this hospitalization.  She will need to monitor her new HIV regimen and will need to increase her oral ferrous sulfate as tolerated for anemia and monitor hypertensive response to lisinopril and Procardia.  CONSULTATIONS:  None.  PROCEDURES PERFORMED: 1. Portable AP chest x-ray showed the heart size and pulmonary     vascularity are normal and the lungs are clear.  No osseous     abnormality. 2. CT angiogram indicated no evidence of aortic dissection or     pulmonary embolus, atherosclerotic-type changes in the coronary     arteries, aorta and great vessels with 60% diameter stenosis of the     left subclavian artery and moderate-to-marked narrowing in the     origin of the celiac artery.  ADMISSION HISTORY OF PRESENT ILLNESS:  The patient is a 65 year old female with a history of hypertension, GERD and HIV positive who presents via transfer from Dr. Ninetta Lights due to concerns for elevated blood pressure readings.  She is not currently taking her hydrochlorothiazide.  At admission, she complained of dyspnea on exertion, paresthesia of the left arm on exertion and some chest pain on exertion, which she described as throbbing and occasionally  stabbing for a variable amount of time.  She complained of a slight headache.  Her HIV is being monitored by Dr. Ninetta Lights with most recent CD-4 count at 490 and HIV viral load at 762 both in January 2012.  She admitted to smoking one half pack per day, recent cocaine use about 1 month ago and a single alcoholic drink on occasion.  She is compliant with the remainder of her medication regimen.  ADMISSION PHYSICAL EXAMINATION:  VITAL SIGNS:  Temperature 97.6, pulse 63, blood pressure 214/109, respiratory rate 18, O2 sats 99% on room air. GENERAL:  Alert, oriented, well developed, cooperative, no apparent distress. EYES:  Vision grossly intact.  PERRL, EOMI.  No injection or icterus. MOUTH:  Pharynx pink and moist.  No erythema.  No exudates. NECK:  Supple.  Full range of motion.  No thyromegaly.  No JVD.  No carotid bruits. EAR, NOSE AND THROAT:  Moist mucous membranes. RESPIRATORY:  Normal respiratory effort, mild-to-moderate wheezes present bilaterally at the lung bases. CARDIOVASCULAR:  Regular rate and rhythm, 2/6 systolic murmur.  No rubs. No gallops.  Pulses 2+ distally. ABDOMEN:  Soft, nontender, nondistended.  Bowel  sounds positive.  No organomegaly. EXTREMITIES:  No cyanosis, clubbing, or edema. NEUROLOGIC:  Alert and oriented x3.  Cranial nerves II through XII grossly intact.  Strengths normal in all extremities.  Sensation intact to light touch.  Gait normal. SKIN:  Turgor normal and no rashes. PSYCHIATRY:  Oriented x3.  Memory intact recent and remote, normal interaction, good eye contact, not anxious appearing, not  depressed appearing.  ADMISSION LABS:  White blood cell 6.8, red blood cells 4.15, hemoglobin 10.5, hematocrit 34.6, MCV 83.4, MCH 25.3, MCHC 30.3, RDW 19.4, platelet count 310, neutrophil percentage 36, lymphocyte percentage 50, monocyte percentage 9, eosinophil percentage 1, basophil percentage 0, neutrophils absolute 2.5, lymphocyte absolute 3.6,  monocytes absolute 0.6, eosinophils absolute 0.1, basophils absolute 0.0.  Sodium 133 potassium 3.5, chloride 102, CO2 28, glucose 94, BUN 11, creatinine 0.64, GFR greater than 60.  Total bilirubin 0.2, alkaline phosphatase 98, AST 35, ALT 25, total protein 8.7, albumin 3.3, calcium 8.9. Cholesterol 122, triglycerides 47, HDL 49, LDL 64, VLDL 9, total cholesterol-to-HDL ratio 2.5.  TSH 0.695.  Iron 22, UIBC greater than 450, vitamin B12 349, serum folate greater than 20, ferritin 8. Urinalysis; urine color yellow, appearance clear, specific gravity greater than 1.046, pH 7.0, urine glucose negative, bilirubin negative, ketones negative, blood negative, protein negative, urobilinogen 1.0, nitrites negative, leukocytes negative.  HOSPITAL COURSE BY PROBLEM LIST: 1. Hypertension.  The patient was referred directly from by Dr.     [facility] for blood pressures in 230s/110s on     admission.  Vitals indicated a blood pressure of 214/109.  Upon     blood pressure rechecked in the right arm, systolic blood pressure     found to be 230 and left arm systolic pressure found to be 272.     The patient complained of dyspnea on exertion, chest pain on     exertion, left arm paresthesias on exertion and mild headache.  The     patient admitted to being noncompliant with home medication     hydrochlorothiazide due to feeling poorlyafter taking it and also admitted to     using cocaine approximately 1 month previously.  CT angiogram ruled     out aortic dissection or pulmonary embolism, but indicated 60%     stenosis of the subclavian artery.  She was started on hydralazine     10 mg IV at the time, lisinopril 20 mg p.o. daily was added to her     home Procardia 90 mg p.o. daily regimen.  Overnight, the patient's     blood pressure was found to be 168/90 and in the morning 129/77.     On the first morning, she no longer complained of any symptoms     associated with the hypertension.  We  continued to monitor her     blood pressure through the day and she stabilized in the 130s/70s.     She will follow up with Dr. Ninetta Lights who will monitor her response     to the lisinopril and Procardia regimen. 2. Anemia.  The patient's admission labs indicated a normocytic anemia     with hemoglobin 10.2, MCV 88.2 and RDW 19.9.  Full anemia panel     indicated total iron 20 and ferritin 8.  The patient indicated no     history of anemia and was not symptomatic.  She was started on     ferrous sulfate 325 mg p.o. daily and will need to follow  up with     Dr. Ninetta Lights to titrate up her ferrous sulfate dose.  She will need     to get a colonoscopy to rule out colonic source of bleeding since     she states that most recent colonoscopy was approximately 30 years     ago. 3. Reactive airway disease.  On admission exam, the patient had mild-     to-moderate wheezes present bilaterally in lung bases.  She had     normal respiratory effort.  She claimed to have a history of asthma     and has used albuterol inhaler.  History of smoking for 52 years     with currently uses one half pack per day.  At her peak, she was     smoking 2 packs per day.  She was started on albuterol/Atrovent     nebulizer therapy, which caused some improvement with the wheezing,     though is still present to a mild degree.  Per her history, she     uses her albuterol inhaler twice per day.  We will plan to start on     Flovent for long-term management.  She will need to follow up with     her PCP and will likely need pulmonary function test to     characterize the reactive airway disease further. 4. Restless legs syndrome.  The patient described a history of     restless legs syndrome where she has some uncontrolled movement in     her legs.  Her home medication regimen for this problem was     gabapentin 300 mg p.o. daily.  She continued to complain of     discomfort from her restless legs and gabapentin was increased  to     300 mg p.o. b.i.d.  We believed that the restless legs are likely     due to the iron-deficiency anemia. 5. Human immunodeficiency virus.  Human immunodeficiency virus is     actively managed by Dr. Ninetta Lights.  Most recent count in January 2012     showed CD-4 count of 490 and HIV viral load of 760.  She has been     on indinavir and Combivir, through Dr. Ninetta Lights requested to change     her medication regimen.  We started Truvada, ritonavir, and     darunavir.  She will follow up with Dr. Ninetta Lights to manage her new     medication regimen.  DISCHARGE VITAL SIGNS:  Temperature 97.9, pulse 63, blood pressure 134/70, respiratory rate 19, O2 sats 96% on room air.  DISCHARGE LABS:  White blood cell 7.1 and red blood cells 3.7, hemoglobin 9.5, hematocrit 31.3, MCV 83.2, MCH 25.3, MCHC 30.4, RDW 19.7, and platelet count 302.  Sodium 137, potassium 3.6, chloride 105, CO2 27, glucose 114, BUN 14, creatinine 0.74.  GFR greater than 60, calcium 8.4.  Urine drug screen; amphetamines negative, marijuana negative, barbiturates negative, methadone negative, propoxyphene negative, benzodiazepines negative, PCP negative, cocaine metabolites negative, opiates negative, creatinine of the urine 43.6.    ______________________________ Leodis Sias, MD   ______________________________ Tilford Pillar, MDCP/MEDQ  D:  01/02/2011  T:  01/03/2011  Job:  [id number]  cc:   Lacretia Leigh. Ninetta Lights, M.D.  Electronically Signed by Leodis Sias MD on 01/04/2011 07:16:06 AM Electronically Signed by Tilford Pillar  on 01/04/2011 04:31:44 PM

## 2011-01-06 LAB — CONVERTED CEMR LAB
ALT: 19 units/L (ref 0–35)
AST: 20 units/L (ref 0–37)
Albumin: 3.5 g/dL (ref 3.5–5.2)
Alkaline Phosphatase: 104 units/L (ref 39–117)
BUN: 18 mg/dL (ref 6–23)
CD4 % Helper T Cell: 13 %
CD4 Count: 430 microliters
CO2: 27 meq/L (ref 19–32)
Calcium: 8.8 mg/dL (ref 8.4–10.5)
Chloride: 102 meq/L (ref 96–112)
Cholesterol: 130 mg/dL (ref 0–200)
Creatinine, Ser: 0.74 mg/dL (ref 0.40–1.20)
Glucose, Bld: 93 mg/dL (ref 70–99)
HCT: 41.5 % (ref 36.0–46.0)
HDL: 60 mg/dL (ref >39)
HIV 1 RNA Quant: 318 copies/mL — ABNORMAL HIGH (ref <50)
HIV-1 RNA Quant, Log: 2.5 — ABNORMAL HIGH (ref <1.70)
Hemoglobin: 13.5 g/dL (ref 12.0–15.0)
LDL Cholesterol: 55 mg/dL (ref 0–99)
MCHC: 32.5 g/dL (ref 30.0–36.0)
MCV: 109.5 fL — ABNORMAL HIGH (ref 78.0–100.0)
Platelets: 319 10*3/uL (ref 150–400)
Potassium: 3.4 meq/L — ABNORMAL LOW (ref 3.5–5.3)
RBC: 3.79 M/uL — ABNORMAL LOW (ref 3.87–5.11)
RDW: 15.6 % — ABNORMAL HIGH (ref 11.5–14.0)
Sodium: 138 meq/L (ref 135–145)
Total Bilirubin: 0.5 mg/dL (ref 0.3–1.2)
Total CHOL/HDL Ratio: 2.2
Total CK: 83 units/L (ref 7–177)
Total Protein: 8.3 g/dL (ref 6.0–8.3)
Triglycerides: 73 mg/dL (ref <150)
VLDL: 15 mg/dL (ref 0–40)
WBC: 5.8 10*3/uL (ref 4.0–10.5)

## 2011-01-08 NOTE — Letter (Signed)
Summary: OV  OV   Imported By: Dorice Lamas 02/17/2007 10:54:03  _____________________________________________________________________  External Attachment:    Type:   Image     Comment:   External Document

## 2011-01-08 NOTE — Miscellaneous (Signed)
Summary: RW  Clinical Lists Changes  Observations: Added new observation of ABNLIPIDS: No (02/11/2007 16:58) Added new observation of LASTLIPIDPRO: 02/11/2007 (02/11/2007 16:58) Added new observation of SYPHLSCREEN: 02/11/2007 (02/11/2007 16:58)

## 2011-01-08 NOTE — Progress Notes (Signed)
Summary: Med request  Phone Note Call from Patient   Summary of Call: Requesting Flexeril refill. She says this script was given by her physician prior to transfering to [facility] Initial call taken by: Tomasita Morrow RN,  November 16, 2007 11:27 AM  Follow-up for Phone Call        Phone Call Completed, Rx Called In per Dr Ninetta Lights Follow-up by: Tomasita Morrow RN,  November 16, 2007 11:33 AM      Prescriptions: FLEXERIL 10 MG  TABS (CYCLOBENZAPRINE HCL) as needed two times a day  #60 x 1   Entered by:   Tomasita Morrow RN   Authorized by:   Johny Sax MD   Signed by:   Tomasita Morrow RN on 11/16/2007   Method used:   Electronically sent to ...       [id number]       [address]       [place], [place]  [address]       Ph: [phone] or [phone]       Fax: [phone]   RxID:   [id number]

## 2011-01-08 NOTE — Assessment & Plan Note (Signed)
Summary: f/u ov/tkk   Chief Complaint:  f/u ov.  History of Present Illness: 65 yo F with hx of HIV+, HTN and GERD.  Last CD4 420 and VL 105 (06-17-07). Complains of knee and leg swelling. Both legs. Having feeling of tingling and pain, mostly at night.   Taking medicine well.  Taking accipex previously, now taking famotidine but not working as well.  has not been back to [employer]. BP has been up and down.  Has not had colonoscopy- too scared.   Current Allergies: ! PENICILLIN    Risk Factors:  Tobacco use:  current    Cigarettes:  Yes -- 1/2 pack(s) per day Passive smoke exposure:  yes Drug use:  yes    Substance:  cocaine, occasionally when in pain HIV high-risk behavior:  no Caffeine use:  5+ drinks per day Alcohol use:  yes    Type:  beer    Drinks per day:  <1 Exercise:  yes    Times per week:  7    Type:  walking, moderately Seatbelt use:  100 %  PAP Smear History:    Date of Last PAP Smear:  02/02/2007    Vital Signs:  Patient Profile:   65 Years Old Female Height:     62 inches (157.48 cm) Weight:      136 pounds BMI:     24.96 Temp:     97.1 degrees F oral Pulse rate:   85 / minute BP sitting:   176 / 87  (left arm)  Pt. in pain?   yes    Location:   bil knee    Intensity:   10    Type:       ache  Vitals Entered By: Tomasita Morrow RN (December 14, 2007 2:48 PM)              Is Patient Diabetic? No Nutritional Status BMI of 19 -24 = normal Nutritional Status Detail Nausea, wirth Norvir  Have you ever been in a relationship where you felt threatened, hurt or afraid?No  Domestic Violence Intervention none  Does patient need assistance? Functional Status Self care Ambulation Normal Comments No missed doses of meds     Physical Exam  General:     well-developed, well-nourished, and well-hydrated.   Eyes:     pupils equal, pupils round, and pupils reactive to light.   Mouth:     pharynx pink and moist and no exudates.   Neck:  no masses.   Lungs:     normal respiratory effort and normal breath sounds.   Heart:     normal rate, regular rhythm, and no murmur.   Abdomen:     soft, non-tender, and normal bowel sounds.   Extremities:     trace left pedal edema and trace right pedal edema.      Impression & Recommendations:  Problem # 1:  HIV DISEASE (ICD-042) she appears to be doing fairly well. will recheck her labs today, offer her condoms,  RTC 3 months Her updated medication list for this problem includes:    Combivir 150-300 Mg Tabs (Lamivudine-zidovudine) .Marland Kitchen... Take 1 tablet by mouth two times a day    Crixivan 400 Mg Caps (Indinavir sulfate) .Marland Kitchen... Take 2 capsules by mouth two times a day    Norvir 100 Mg Caps (Ritonavir) .Marland Kitchen... Take 2 capsules by mouth two times a day  Orders: Est. Patient Level IV ([address]) T-Comprehensive Metabolic Panel [phone]) T-Lipid Profile (91478-29562) T-CBC  w/Diff [phone]) T-CD4 [phone]) T-Syphilis Test (RPR) [phone]) T-HIV Viral Load [phone]) T-Hepatitis C Viral Load [phone]) T-Hepatitis A Antibody (02725-36644) T-Hepatitis C Antibody (03474-25956)   Problem # 2:  CARIES, DENTAL NEC (ICD-521.09) try to have her seen by dental  Problem # 3:  SYNDROME, RESTLESS LEGS (ICD-333.94) trail of neurontin for this. previous Cr NL Orders: Est. Patient Level IV ([address]) T-Comprehensive Metabolic Panel [phone]) T-Lipid Profile [phone]) T-CBC w/Diff [phone]) T-CD4 [phone]) T-Syphilis Test (RPR) [phone]) T-HIV Viral Load [phone])   Problem # 4:  TOBACCO ABUSE (ICD-305.1) again, counseld to quit Orders: Est. Patient Level IV (62694) T-Comprehensive Metabolic Panel (85462-70350) T-Lipid Profile (09381-82993) T-CBC w/Diff (71696-78938) T-CD4 (10175-10258) T-Syphilis Test (RPR) (52778-24235) T-HIV Viral Load (36144-31540)   Problem # 5:  DYSPLASIA, CERVIX NOS (ICD-622.10) will try to have her eval by  GYN  Medications Added to Medication List This Visit: 1)  Aciphex 20 Mg Tbec (Rabeprazole sodium) 2)  Neurontin 300 Mg Caps (Gabapentin) .... Take 1 tablet by mouth once a day for 1 day then take 1 tablet by mouth two times a day     Prescriptions: NEURONTIN 300 MG  CAPS (GABAPENTIN) Take 1 tablet by mouth once a day for 1 day then Take 1 tablet by mouth two times a day  #90 x 3   Entered and Authorized by:   Johny Sax MD   Signed by:   Johny Sax MD on 12/14/2007   Method used:   Electronically sent to ...       [id number]       [address]       [place], [place]  [address]       Ph: [phone] or [phone]       Fax: [phone]   RxID:   [id number] HYDROCHLOROTHIAZIDE 12.5 MG TABS (HYDROCHLOROTHIAZIDE) Take 1 tablet by mouth once a day  #30 x 3   Entered and Authorized by:   Johny Sax MD   Signed by:   Johny Sax MD on 12/14/2007   Method used:   Electronically sent to ...       [id number]       [address]       [place], [place]  [address]       Ph: [phone] or [phone]       Fax: [phone]   RxID:   [id number] ENSURE   LIQD (NUTRITIONAL SUPPLEMENTS) one three times dail  #24 x prn   Entered and Authorized by:   Johny Sax MD   Signed by:   Johny Sax MD on 12/14/2007   Method used:   Electronically sent to ...       [id number]       [address] [place]  [address]       [place], [place]  [address]       Ph: [phone] or [phone]       Fax: [phone]   RxID:   [id number] PROCARDIA XL 90 MG TB24 (NIFEDIPINE) Take 1 tablet by mouth once a day  #30 x 3   Entered and Authorized by:   Johny Sax MD   Signed by:   Johny Sax MD on 12/14/2007   Method used:   Electronically sent  to ...       [id number]       [address]       [place], [place]  [address]       Ph: [phone] or [phone]       Fax: [phone]   RxID:   [id number] NORVIR 100 MG CAPS (RITONAVIR) Take 2 capsules by mouth two times a day  #120 x 3   Entered and Authorized by:   Johny Sax MD   Signed by:   Johny Sax MD on 12/14/2007   Method used:   Electronically sent to ...       [id number]       [address]       [place], [place]  [address]       Ph: [phone] or [phone]       Fax: [phone]   RxID:   [id number] CRIXIVAN 400 MG CAPS (INDINAVIR SULFATE) Take 2 capsules by mouth two times a day  #120 x 3   Entered and Authorized by:   Johny Sax MD   Signed by:   Johny Sax MD on 12/14/2007   Method used:   Electronically sent to ...       [id number]       [address]       [place], [place]  [address]       Ph: [phone] or [phone]       Fax: [phone]   RxID:   [id number] COMBIVIR 150-300 MG TABS (LAMIVUDINE-ZIDOVUDINE) Take 1 tablet by mouth two times a day  #60 x 3   Entered and Authorized by:   Johny Sax MD   Signed by:   Johny Sax MD on 12/14/2007   Method used:   Electronically sent to ...       [id number]       [address]       [place], [place]  [address]       Ph: [phone] or [phone]       Fax: [phone]   RxID:   [id number] ACIPHEX 20 MG  TBEC (RABEPRAZOLE SODIUM)   #90 x 12   Entered and Authorized by:   Johny Sax MD   Signed by:   Johny Sax MD on 12/14/2007   Method used:   Electronically sent to ...       [id number]       [address]       [place], [place]  [address]       Ph: [phone] or [phone]       Fax: [phone]   RxID:   [id number]  ]

## 2011-01-08 NOTE — Progress Notes (Signed)
Summary: refill/mld  Phone Note Call from Patient   Caller: Patient Reason for Call: Refill Medication Summary of Call: Patient called to get a refill on her Flexeril.  She would like it to be sent to [facility] [address] Initial call taken by: Paulo Fruit  BS,CPht II,MPH,  May 08, 2010 9:59 AM    Prescriptions: FLEXERIL 10 MG  TABS (CYCLOBENZAPRINE HCL) as needed two times a day  #60 Tablet x 1   Entered by:   Paulo Fruit  BS,CPht II,MPH   Authorized by:   Johny Sax MD   Signed by:   Paulo Fruit  BS,CPht II,MPH on 05/08/2010   Method used:   Electronically to        [facility] [address] [address] [phone]* (retail)       [address]       [place], [place]  [address]       Ph: [phone]       Fax: [phone]   RxID:   [id number]  Paulo Fruit  BS,CPht II,MPH  May 08, 2010 10:00 AM

## 2011-01-08 NOTE — Miscellaneous (Signed)
Summary: RW: Medicare  Clinical Lists Changes  Observations: Added new observation of PAYOR: Medicare (11/08/2008 15:48)

## 2011-01-08 NOTE — Progress Notes (Signed)
Summary: Aciphex, daily one tablet  Phone Note Refill Request Call back at Home Phone [phone]   Refills Requested: Medication #1:  ACIPHEX 20 MG  TBEC Take 1 tablet by mouth once a day Initial call taken by: Jennet Maduro RN,  December 16, 2007 3:05 PM Caller: Patient Reason for Call: Talk to Doctor Summary of Call: Aciphex rx needs more information for pharmacy to fill.  Please advise. Initial call taken by: Jennet Maduro RN,  December 16, 2007 9:07 AM    New/Updated Medications: ACIPHEX 20 MG  TBEC (RABEPRAZOLE SODIUM) Take 1 tablet by mouth once a day   Prescriptions: ACIPHEX 20 MG  TBEC (RABEPRAZOLE SODIUM) Take 1 tablet by mouth once a day  #30 x 3   Entered by:   Jennet Maduro RN   Authorized by:   Johny Sax MD   Signed by:   Jennet Maduro RN on 12/16/2007   Method used:   Electronically sent to ...       [id number]       [address]       [place], [place]  [address]       Ph: [phone] or [phone]       Fax: [phone]   RxID:   [id number]

## 2011-01-08 NOTE — Miscellaneous (Signed)
Summary: [facility]  [facility]   Imported By: Dorice Lamas 05/07/2007 15:17:52  _____________________________________________________________________  External Attachment:    Type:   Image     Comment:   External Document

## 2011-01-08 NOTE — Letter (Signed)
Summary: [facility]PCS  [facility]:PCS   Imported By: Florinda Marker 08/27/2010 14:37:49  _____________________________________________________________________  External Attachment:    Type:   Image     Comment:   External Document

## 2011-01-08 NOTE — Progress Notes (Signed)
Summary: Needs help with stress.  Phone Note Call from Patient   Summary of Call: Pt's son [place] 05/01/2023.  She is stressed out and has not been able to sleep since getting the news.  She is requesting meds for "her nerves". Initial call taken by: Tomasita Morrow RN,  November 16, 2008 11:20 AM    New/Updated Medications: ALPRAZOLAM 0.5 MG TABS (ALPRAZOLAM) one evry 8 hours as needed.   Prescriptions: ALPRAZOLAM 0.5 MG TABS (ALPRAZOLAM) one evry 8 hours as needed.  #15 x 0   Entered by:   Tomasita Morrow RN   Authorized by:   Johny Sax MD   Signed by:   Tomasita Morrow RN on 11/16/2008   Method used:   Telephoned to ...       [id number] (retail)       [address]       [place], [place]  [address]       Ph: [phone] or [phone]       Fax: [phone]   RxID:   [id number]

## 2011-01-08 NOTE — Assessment & Plan Note (Signed)
Summary: CHECKUP [MKJ]   CC:  follow-up visit.  History of Present Illness: 65 yo F with hx of HIV+, HTN and GERD.  Last CD4 560 and VL 767  (07-16-2010).  Has been coughing for a week, having substernal pain with coughing. Feeling fatigued. Coughing up alot of mucous. No hemoptysis. Has cut back her smoking. Hot and cold sweats.  Cont to have R hip pain, not had MRI yet. has not had Mammo or colonoscopy yet either.   Preventive Screening-Counseling & Management  Alcohol-Tobacco     Alcohol drinks/day: occassionally     Alcohol type: beer     Smoking Status: current     Smoking Cessation Counseling: yes     Packs/Day: 0.5     Passive Smoke Exposure: yes  Caffeine-Diet-Exercise     Caffeine use/day: coffee and tea     Does Patient Exercise: yes     Type of exercise: walking when can     Exercise (avg: min/session): 30-60     Times/week: 6  Safety-Violence-Falls     Seat Belt Use: yes   Updated Prior Medication List: HYDROCHLOROTHIAZIDE 12.5 MG TABS (HYDROCHLOROTHIAZIDE) Take 1 tablet by mouth once a day COMBIVIR 150-300 MG TABS (LAMIVUDINE-ZIDOVUDINE) Take 1 tablet by mouth two times a day CRIXIVAN 400 MG CAPS (INDINAVIR SULFATE) Take 2 capsules by mouth two times a day PROCARDIA XL 90 MG TB24 (NIFEDIPINE) Take 1 tablet by mouth once a day FLEXERIL 10 MG  TABS (CYCLOBENZAPRINE HCL) as needed two times a day ENSURE   LIQD (NUTRITIONAL SUPPLEMENTS) one three times dail NEURONTIN 300 MG  CAPS (GABAPENTIN) Take 1 tablet by mouth once a day for 1 day then Take 1 tablet by mouth two times a day ALPRAZOLAM 0.5 MG TABS (ALPRAZOLAM) one evry 8 hours as needed. ACIPHEX 20 MG TBEC (RABEPRAZOLE SODIUM) Take 1 tablet by mouth once a day  Current Allergies (reviewed today): ! PENICILLIN Past History:  Past medical, surgical, family and social histories (including risk factors) reviewed, and no changes noted (except as noted below).  Past Medical History: Reviewed history from  01/23/2007 and no changes required. HIV disease Hypertension GERD Cataract, bilateral Restless leg syndrome Tobacco abuse  Past Surgical History: Reviewed history from 01/23/2007 and no changes required. Hysterectomy  Family History: Reviewed history from 10/10/2008 and no changes required. denies.parents living, daughters local. siblings died from ESRD, brain thing, 1 brother and sister died from AIDS.   Social History: Reviewed history from 10/10/2008 and no changes required. Current Smoker Alcohol use-yes- seldom Drug use-no  Review of Systems       wt down 3#  Vital Signs:  Patient profile:   65 year old female Height:      63 inches (160.02 cm) Weight:      132.8 pounds (60.36 kg) BMI:     23.61 O2 Sat:      98 % on Room air Temp:     97.5 degrees F (36.39 degrees C) oral Pulse rate:   70 / minute BP sitting:   165 / 84  (left arm)  Vitals Entered By: Baxter Hire) (August 27, 2010 10:05 AM)  O2 Flow:  Room air CC: follow-up visit Pain Assessment Patient in pain? no      Nutritional Status BMI of 19 -24 = normal Nutritional Status Detail appetite is so-so per patient  Have you ever been in a relationship where you felt threatened, hurt or afraid?No   Does patient need assistance? Functional Status Self  care Ambulation Normal   Physical Exam  General:  well-developed, well-nourished, and well-hydrated.   Eyes:  pupils equal, pupils round, and pupils reactive to light.   Mouth:  pharynx pink and moist.   Neck:  no masses.  tenderness on R neck, no mass palpable.  Lungs:  normal respiratory effort.  bronchitic sounds Heart:  normal rate, regular rhythm, and no murmur.   Abdomen:  soft, non-tender, and normal bowel sounds.          Medication Adherence: 08/27/2010   Adherence to medications reviewed with patient. Counseling to provide adequate adherence provided   Prevention For Positives: 08/27/2010   Safe sex practices discussed with  patient. Condoms offered.                             Impression & Recommendations:  Problem # 1:  HIV DISEASE (ICD-042)  prev appts for Mammo, colonoscopy and MRI are revived. she needs a repeat PAP as well. Will get CXR today for her bronchitis. SHe is given Azithro 600mg  by mouth once daily for 3 days for this as well.  She is taking her meds well.   Orders: CXR- 2view (CXR)  Problem # 2:  TOBACCO ABUSE (ICD-305.1) encouraged to quit smoking.   Problem # 3:  HYPERTENSION (ICD-401.9) she appears to be doing well.  Her updated medication list for this problem includes:    Hydrochlorothiazide 12.5 Mg Tabs (Hydrochlorothiazide) .Marland Kitchen... Take 1 tablet by mouth once a day    Procardia Xl 90 Mg Tb24 (Nifedipine) .Marland Kitchen... Take 1 tablet by mouth once a day  Other Orders: Est. Patient Level IV (16109) Future Orders: T-CD4SP ([facility]) (CD4SP) ... 11/25/2010 T-HIV Viral Load [phone]) ... 11/25/2010 T-Comprehensive Metabolic Panel [phone]) ... 11/25/2010 T-CBC w/Diff ([id number]) ... 11/25/2010  Appended Document: CHECKUP [MKJ] Dr. Ninetta Lights gave samples of Azithromycin 600mg  once daily for 3 days.  Sample Given, Lot #: Azithromycin 600mg  [id number] Expiration Date:085/2012 Patient has been instructed regarding the correct time, dose and frequency of taking this med, including desired effects and most common side effects.  Appended Document: Orders Update    Clinical Lists Changes  Orders: Added new Service order of Influenza Vaccine MCR [phone]) - Signed Observations: Added new observation of FLU VAX#1VIS: 07/02/07 version given August 27, 2010. (08/27/2010 11:26) Added new observation of FLU VAXLOT: 11033P (08/27/2010 11:26) Added new observation of FLU VAX EXP: 03/10/2011 (08/27/2010 11:26) Added new observation of FLU VAXBY: Kathi Simpers [facility]) (08/27/2010 11:26) Added new observation of FLU VAXRTE: IM (08/27/2010 11:26) Added new observation of FLU VAX DSE:  0.5 ml (08/27/2010 11:26) Added new observation of FLU VAXMFR: Novartis (08/27/2010 11:26) Added new observation of FLU VAX SITE: right deltoid (08/27/2010 11:26) Added new observation of FLU VAX: Fluvax MCR (08/27/2010 11:26)       Influenza Vaccine    Vaccine Type: Fluvax MCR    Site: right deltoid    Mfr: Novartis    Dose: 0.5 ml    Route: IM    Given by: Kathi Simpers CMA(AAMA)    Exp. Date: 03/10/2011    Lot #: [id number]    VIS given: 07/02/07 version given August 27, 2010.  Flu Vaccine Consent Questions    Do you have a history of severe allergic reactions to this vaccine? no    Any prior history of allergic reactions to egg and/or gelatin? no    Do you have a sensitivity to the preservative Thimersol?  no    Do you have a past history of Guillan-Barre Syndrome? no    Do you currently have an acute febrile illness? no    Have you ever had a severe reaction to latex? no    Vaccine information given and explained to patient? yes    Are you currently pregnant? no

## 2011-01-08 NOTE — Miscellaneous (Signed)
Summary: Lab Rpt: CD4  Clinical Lists Changes  Observations: Added new observation of CD4 COUNT: 430 microliters (02/11/2007 12:35) Added new observation of CD4 %: 13 % (02/11/2007 12:35)

## 2011-01-08 NOTE — Progress Notes (Signed)
Summary: Requesting note for return to work  Phone Note Call from Patient   Summary of Call: Voicemail:  Pt states she was out of work for a week due to mold in her bathroom. She says she stayed at home and "doctored " on her symptoms and did not call the office.  Pt is requesting a return to work note.  Messsage left on voicemail: since we were not aware of the problem , we are not able to give a note for work return. Laurell Josephs, RN Initial call taken by: Tomasita Morrow RN,  July 14, 2009 2:05 PM

## 2011-01-08 NOTE — Progress Notes (Signed)
Summary: Requesting form completion  Phone Note Call from Patient   Summary of Call: She is sending form for completion re: her care giver . She says he has been helping her for years and needs a form completed.  Pt advised to send form for review.  Form received..  Awaiting signature from physician Initial call taken by: Tomasita Morrow RN,  September 15, 2009 3:08 PM  Follow-up for Phone Call        Pt informed form received. Dr Ninetta Lights may have questions due to the fact  we were  not aware of her living situation. He will be returning on 09-27-09 . I will call once I have more info. Follow-up by: Tomasita Morrow RN,  September 25, 2009 9:45 AM  Additional Follow-up for Phone Call Additional follow up Details #1::        Form signed and faxed to [facility] [phone]  spoke with office to confirm reciept. Message left on voicemail @ [phone] Additional Follow-up by: Tomasita Morrow RN,  September 29, 2009 12:16 PM

## 2011-01-08 NOTE — Letter (Signed)
Summary: Nicole Mccormick  Nicole Mccormick   Imported By: Dorice Lamas 02/17/2007 10:55:37  _____________________________________________________________________  External Attachment:    Type:   Image     Comment:   External Document

## 2011-01-08 NOTE — Letter (Signed)
Summary: OV  OV   Imported By: Dorice Lamas 02/17/2007 10:52:44  _____________________________________________________________________  External Attachment:    Type:   Image     Comment:   External Document

## 2011-01-08 NOTE — Progress Notes (Signed)
Summary: PCS paperwork completed and faxed  Phone Note Call from Patient   Caller: Patient Reason for Call: Talk to Nurse Action Taken: Phone Call Completed Details for Reason: Paperwork for PCS. Details of Action Taken: Dr. Ninetta Lights signed paperwork and it was faxed to [facility].  Initial call taken by: Jennet Maduro RN,  Apr 21, 2007 9:23 AM

## 2011-01-08 NOTE — Letter (Signed)
Summary: Live-In Care Verification  Live-In Care Verification   Imported By: Florinda Marker 08/01/2010 14:23:31  _____________________________________________________________________  External Attachment:    Type:   Image     Comment:   External Document

## 2011-01-08 NOTE — Progress Notes (Signed)
Summary: bleeding hemorrhoids/jh/dde  Phone Note Call from Patient Call back at Home Phone [phone]   Caller: Patient Reason for Call: Talk to Nurse Action Taken: Phone Call Completed Details for Reason: Spots of blood from rectum after defecating.  Knows she has hemorrhoids.  Recommended purchasing OTC hemorrhoid cream and using as per package directions.  Pt. to call back after starting the cream to let the office know efficacy.  Details of Action Taken: Pt. to purchase OTC hemorrhoid cream anbd use per package directions. Initial call taken by: Jennet Maduro RN,  June 25, 2007 12:28 PM

## 2011-01-08 NOTE — Assessment & Plan Note (Signed)
Summary: fukam   Chief Complaint:  follow up visit.  History of Present Illness: 65 yo F with hx of HIV+, HTN and GERD.  GERD has been well controlledwhen she takes her medication.  She has followed up with nephrology- states she had hematuria. had an u/s which was negative.   has been taking her ART.  Takes after eating, otherwise gives her nausea (due to Ritonavir). Has been skipping RTv occasionally.  weight steady, no dysuria, nl BM.    Current Allergies (reviewed today): ! PENICILLIN    Risk Factors:  Tobacco use:  current    Cigarettes:  Yes -- 1/4 pack(s) per day Passive smoke exposure:  yes Drug use:  yes    Substance:  cocaine, occasionally when in pain HIV high-risk behavior:  no Caffeine use:  5+ drinks per day Alcohol use:  yes    Type:  beer    Drinks per day:  <1 Exercise:  yes    Times per week:  7    Type:  walking, moderately Seatbelt use:  100 %  PAP Smear History:    Date of Last PAP Smear:  02/02/2007    Vital Signs:  Patient Profile:   65 Years Old Female Height:     62 inches (157.48 cm) Weight:      136.7 pounds (62.14 kg) BMI:     25.09 Temp:     98.3 degrees F (36.83 degrees C) oral Pulse rate:   67 / minute BP sitting:   177 / 91  (right arm)  Pt. in pain?   no  Vitals Entered By: Geannie Risen RN (June 17, 2007 10:55 AM)              Is Patient Diabetic? No Nutritional Status BMI of 25 - 29 = overweight  Have you ever been in a relationship where you felt threatened, hurt or afraid?No   Does patient need assistance? Functional Status Self care Ambulation Normal   Physical Exam  General:     well-developed, well-nourished, and well-hydrated.   Eyes:     pupils equal, pupils round, and pupils reactive to light.   Mouth:     pharynx pink and moist, no exudates, and poor dentition.   Neck:     no masses.   Lungs:     normal respiratory effort and normal breath sounds.   Heart:     normal rate, regular rhythm, and no  murmur.   Abdomen:     soft, non-tender, and normal bowel sounds.      Impression & Recommendations:  Problem # 1:  HIV DISEASE (ICD-042) she appears to be doing well will recheck her labs, mostly would like to see if she is still suppresssed after taking RTV irregularly. willgive her PNVX booster today.  She is given condoms.   Her updated medication list for this problem includes:    Combivir 150-300 Mg Tabs (Lamivudine-zidovudine) .Marland Kitchen... Take 1 tablet by mouth two times a day    Crixivan 400 Mg Caps (Indinavir sulfate) .Marland Kitchen... Take 2 capsules by mouth two times a day    Norvir 100 Mg Caps (Ritonavir) .Marland Kitchen... Take 2 capsules by mouth two times a day  Orders: Est. Patient Level IV (01751) T-Comprehensive Metabolic Panel (02585-27782) T-CBC No Diff (42353-61443) T-CD4 (15400-86761) T-HIV Viral Load (95093-26712) T-Syphilis Test (RPR) (45809-98338) T-Lipid Profile (25053-97673)   Problem # 2:  CARIES, DENTAL NEC (ICD-521.09) she has been trying to get apt with dental. i have  encouraged her to keep trying.   Problem # 3:  HYPERTENSION (ICD-401.9) greatly appreciate [facility] evaluation of this patient.  SHe has not increased her Procardia to 180mg  once daily as instructed, I have encouraged her to do this and f/u with nephrology.   The following medications were removed from the medication list:    Zestril 20 Mg Tabs (Lisinopril) .Marland Kitchen... Take 1 tablet by mouth once a day  Her updated medication list for this problem includes:    Hydrochlorothiazide 12.5 Mg Tabs (Hydrochlorothiazide) .Marland Kitchen... Take 1 tablet by mouth once a day    Procardia Xl 90 Mg Tb24 (Nifedipine) .Marland Kitchen... Take 1 tablet by mouth once a day   CC: [facility]   Medications Added to Medication List This Visit: 1)  Flexeril 10 Mg Tabs (Cyclobenzaprine hcl) .... As needed two times a day   Patient Instructions: 1)  Please schedule a follow-up appointment in 4-5 months.   ]    Appended Document:  fukam    Clinical Lists Changes  Orders: Added new Service order of Pneumococcal Vaccine (08657) - Signed Added new Service order of Admin 1st Vaccine (84696) - Signed Observations: Added new observation of PNEUMOVAXVIS: 07/06/96 version given June 17, 2007. (06/17/2007 12:31) Added new observation of PNEUMOVAXLOT: 0519X (06/17/2007 12:31) Added new observation of PNEUMOVAXEXP: 01/13/2009 (06/17/2007 12:31) Added new observation of PNEUMOVAXBY: Geannie Risen RN (06/17/2007 12:31) Added new observation of PNEUMOVAXRTE: IM (06/17/2007 12:31) Added new observation of PNEUMOVAXMFR: Merck (06/17/2007 12:31) Added new observation of PNEUMOVAXSIT: left deltoid (06/17/2007 12:31) Added new observation of PNEUMOVAX: Pneumovax (06/17/2007 12:31)       Pneumovax Vaccine    Vaccine Type: Pneumovax    Site: left deltoid    Mfr: Merck    Dose: 0.5 ml    Route: IM    Given by: Geannie Risen RN    Exp. Date: 01/13/2009    Lot #: [id number]    VIS given: 07/06/96 version given June 17, 2007.

## 2011-01-08 NOTE — Progress Notes (Signed)
Summary: Increased BP   Phone Note Call from Patient   Caller: Patient Summary of Call: Patient states she was seen today at Urgent Care and diagnosed with a respiratory infection. She was given antibiotics and cough medications.  She states her blood pressure was elevated and she is concerned.  I asked pt if she had missed any of her BP medications and she admits having  been w/o her Procardia for one week and  has plans to purchase it later this week.  Pt advised to get medications as soon as possible . This is probably the reason for her increased BP. Initial call taken by: Tomasita Morrow RN,  March 27, 2009 4:46 PM

## 2011-01-08 NOTE — Miscellaneous (Signed)
Summary: Home Care Report/[facility]  Home Care Report/[facility]   Imported By: Randon Goldsmith 06/11/2007 10:04:08  _____________________________________________________________________  External Attachment:    Type:   Image     Comment:   External Document

## 2011-01-08 NOTE — Miscellaneous (Signed)
Summary: CD4  Clinical Lists Changes  Observations: Added new observation of CD4 COUNT: 420 microliters (06/18/2007 12:39)

## 2011-01-08 NOTE — Miscellaneous (Signed)
Summary: [facility]  [facility].   Imported By: Randon Goldsmith 07/21/2007 15:32:39  _____________________________________________________________________  External Attachment:    Type:   Image     Comment:   External Document

## 2011-01-08 NOTE — Assessment & Plan Note (Signed)
    Influenza Vaccine    Vaccine Type: Fluvax MCR    Site: left deltoid    Mfr: novartis    Dose: 0.5 ml    Route: IM    Given by: Starleen Arms    Exp. Date: [id number]    Lot #: [id number]    VIS given: 07/02/07 version given December 14, 2007.  Flu Vaccine Consent Questions    Do you have a history of severe allergic reactions to this vaccine? no    Any prior history of allergic reactions to egg and/or gelatin? no    Do you have a sensitivity to the preservative Thimersol? no    Do you have a past history of Guillan-Barre Syndrome? no    Do you currently have an acute febrile illness? no    Have you ever had a severe reaction to latex? no    Vaccine information given and explained to patient? yes    Are you currently pregnant? no

## 2011-01-08 NOTE — Assessment & Plan Note (Signed)
Summary: ppd only/tkk    Prior Medications: HYDROCHLOROTHIAZIDE 12.5 MG TABS (HYDROCHLOROTHIAZIDE) Take 1 tablet by mouth once a day COMBIVIR 150-300 MG TABS (LAMIVUDINE-ZIDOVUDINE) Take 1 tablet by mouth two times a day CRIXIVAN 400 MG CAPS (INDINAVIR SULFATE) Take 2 capsules by mouth two times a day PROCARDIA XL 90 MG TB24 (NIFEDIPINE) Take 1 tablet by mouth once a day FLEXERIL 10 MG  TABS (CYCLOBENZAPRINE HCL) as needed two times a day ENSURE   LIQD (NUTRITIONAL SUPPLEMENTS) one three times dail NEURONTIN 300 MG  CAPS (GABAPENTIN) Take 1 tablet by mouth once a day for 1 day then Take 1 tablet by mouth two times a day ALPRAZOLAM 0.5 MG TABS (ALPRAZOLAM) one evry 8 hours as needed. ACIPHEX 20 MG TBEC (RABEPRAZOLE SODIUM) Take 1 tablet by mouth once a day Current Allergies: ! PENICILLIN Immunizations Administered:  PPD Skin Test:    Vaccine Type: PPD    Site: right forearm    Mfr: Sanofi Pasteur    Dose: 0.1 ml    Route: ID    Given by: Kathi Simpers CMA(AAMA)    Exp. Date: 01/04/2012    Lot #: [id number]  Orders Added: 1)  TB Skin Test [phone]

## 2011-01-08 NOTE — Miscellaneous (Signed)
Summary: RW - HIV/AIDS Status  Clinical Lists Changes  Observations: Added new observation of YEARAIDSPOS: HIV2008, c (12/08/2007 11:57)                                                               Hep C Result:  Yes

## 2011-01-08 NOTE — Miscellaneous (Signed)
Summary: [facility]PACT form  [facility]PACT form   Imported By: Randon Goldsmith 07/13/2007 11:01:42  _____________________________________________________________________  External Attachment:    Type:   Image     Comment:   External Document

## 2011-01-08 NOTE — Miscellaneous (Signed)
Summary: Orders Update - LABS  Clinical Lists Changes  Orders: Added new Test order of T-CBC w/Diff [phone]) - Signed Added new Test order of T-CD4 [id number]) - Signed Added new Test order of T-Comprehensive Metabolic Panel [phone]) - Signed Added new Test order of T-HIV Viral Load [phone]) - Signed Added new Test order of T-RPR (Syphilis) [phone]) - Signed Added new Test order of T-Lipid Profile ([id number]) - Signed

## 2011-01-08 NOTE — Miscellaneous (Signed)
Summary: Problem list update  Clinical Lists Changes  Problems: Added new problem of SCREENING FOR MALIGNANT NEOPLASM OF THE CERVIX (ICD-V76.2) 

## 2011-01-08 NOTE — Progress Notes (Signed)
Summary: chest congestion  Phone Note Call from Patient   Caller: Patient Summary of Call: Patient called on yesterday 12-27-08 c/o chest congeston w/o fever X 2 days. She is requesting medication. Pt advised per Dr Ninetta Lights to try Mucinex OTC. If symptoms persist or fever develops call the office for appt. Initial call taken by: Tomasita Morrow RN,  December 28, 2008 9:42 AM

## 2011-01-08 NOTE — Progress Notes (Signed)
Summary: Needs PAP appt. prior to [facility] appt., scheduled  Phone Note From Other Clinic   Caller: Diane Day, RN, [facility] clinic Reason for Call: Schedule Patient Appt Action Taken: Phone Call Completed, Patient called Details of Action Taken: PAP appt. 02/17/08 @ 1400 Summary of Call: Needs current PAP prior to [facility] appt.   Initial call taken by: Jennet Maduro RN,  February 16, 2008 2:46 PM

## 2011-01-08 NOTE — Assessment & Plan Note (Signed)
Summary: est-per tammy/cfb   CC:  check up.  History of Present Illness: 65 yo F with hx of HIV+, HTN and GERD.  Last CD4 500 and VL 211 (Jan 2011). c/o R knee and hip pain. feels more like muscle tenderness. has to take weight off when she stands on too long.   Preventive Screening-Counseling & Management  Alcohol-Tobacco     Alcohol drinks/day: occassionally     Alcohol type: beer     Smoking Status: current     Smoking Cessation Counseling: yes     Packs/Day: 0.5     Passive Smoke Exposure: yes  Caffeine-Diet-Exercise     Caffeine use/day: coffee and tea     Does Patient Exercise: yes     Type of exercise: walking when can  Safety-Violence-Falls     Seat Belt Use: yes   Updated Prior Medication List: HYDROCHLOROTHIAZIDE 12.5 MG TABS (HYDROCHLOROTHIAZIDE) Take 1 tablet by mouth once a day COMBIVIR 150-300 MG TABS (LAMIVUDINE-ZIDOVUDINE) Take 1 tablet by mouth two times a day CRIXIVAN 400 MG CAPS (INDINAVIR SULFATE) Take 2 capsules by mouth two times a day PROCARDIA XL 90 MG TB24 (NIFEDIPINE) Take 1 tablet by mouth once a day FLEXERIL 10 MG  TABS (CYCLOBENZAPRINE HCL) as needed two times a day ENSURE   LIQD (NUTRITIONAL SUPPLEMENTS) one three times dail NEURONTIN 300 MG  CAPS (GABAPENTIN) Take 1 tablet by mouth once a day for 1 day then Take 1 tablet by mouth two times a day ALPRAZOLAM 0.5 MG TABS (ALPRAZOLAM) one evry 8 hours as needed. ZANTAC 150 MG TABS (RANITIDINE HCL) one by mouth two times a day  Current Allergies (reviewed today): ! PENICILLIN Current Medications (verified): 1)  Hydrochlorothiazide 12.5 Mg Tabs (Hydrochlorothiazide) .... Take 1 Tablet By Mouth Once A Day 2)  Combivir 150-300 Mg Tabs (Lamivudine-Zidovudine) .... Take 1 Tablet By Mouth Two Times A Day 3)  Crixivan 400 Mg Caps (Indinavir Sulfate) .... Take 2 Capsules By Mouth Two Times A Day 4)  Procardia Xl 90 Mg Tb24 (Nifedipine) .... Take 1 Tablet By Mouth Once A Day 5)  Flexeril 10 Mg  Tabs  (Cyclobenzaprine Hcl) .... As Needed Two Times A Day 6)  Ensure   Liqd (Nutritional Supplements) .... One Three Times Dail 7)  Neurontin 300 Mg  Caps (Gabapentin) .... Take 1 Tablet By Mouth Once A Day For 1 Day Then Take 1 Tablet By Mouth Two Times A Day 8)  Alprazolam 0.5 Mg Tabs (Alprazolam) .... One Evry 8 Hours As Needed. 9)  Zantac 150 Mg Tabs (Ranitidine Hcl) .... One By Mouth Two Times A Day  Allergies (verified): 1)  ! Penicillin   Review of Systems       trying to quit smoking. wt up 10#. has been off procardia for 1.5 months. trying to get teeth fixed. some sadness with death of son 1 year ago.   Vital Signs:  Patient profile:   65 year old female Height:      63 inches (160.02 cm) Weight:      135.9 pounds (61.77 kg) BMI:     24.16 Temp:     97.8 degrees F (36.56 degrees C) oral Pulse rate:   75 / minute BP sitting:   218 / 94  (left arm)  Vitals Entered By: Baxter Hire) (January 25, 2010 2:57 PM) CC: check up Pain Assessment Patient in pain? yes     Location: right side Intensity: 10 Type: aching,sharp Onset of pain  Constant  pain for the past 3 weeks Nutritional Status BMI of 19 -24 = normal Nutritional Status Detail appetite is okay per patient  Have you ever been in a relationship where you felt threatened, hurt or afraid?No   Does patient need assistance? Functional Status Self care Ambulation Normal        Medication Adherence: 01/25/2010   Adherence to medications reviewed with patient. Counseling to provide adequate adherence provided   Prevention For Positives: 01/25/2010   Safe sex practices discussed with patient. Condoms offered.                             Physical Exam  General:  well-developed, well-nourished, and well-hydrated.   Eyes:  pupils equal, pupils round, and pupils reactive to light.   Mouth:  pharynx pink and moist, poor dentition, and teeth missing.   Neck:  no masses.   Lungs:  normal respiratory effort  and normal breath sounds.   Heart:  normal rate, regular rhythm, and no murmur.   Abdomen:  soft, non-tender, and normal bowel sounds.   Msk:  tenderness R thigh, laterally. fullness. no pain ith rotation of hip.    Impression & Recommendations:  Problem # 1:  HIV DISEASE (ICD-042)  has been seen at GYN- was told that she did not need further tests. thinks she may be do to mammo. needs colon. taking meds well. labs good. flu shot today. offered condoms. return to clinic 4-5 months.   Orders: Mammogram (Mammogram) Gastroenterology Referral (GI)  Problem # 2:  MYALGIA/MYOSITIS NOS (ICD-729.1)  will get MRI of R thigh and R hip.  Her updated medication list for this problem includes:    Flexeril 10 Mg Tabs (Cyclobenzaprine hcl) .Marland Kitchen... As needed two times a day  Orders: MRI with Contrast (MRI w/Contrast)  Problem # 3:  HYPERTENSION (ICD-401.9) will refill her meds.  will give her clonidine 0.1 in clnic x1.  Her updated medication list for this problem includes:    Hydrochlorothiazide 12.5 Mg Tabs (Hydrochlorothiazide) .Marland Kitchen... Take 1 tablet by mouth once a day    Procardia Xl 90 Mg Tb24 (Nifedipine) .Marland Kitchen... Take 1 tablet by mouth once a day  Problem # 4:  GERD (ICD-530.81) wants H2 changed to PPI (aciphex).  The following medications were removed from the medication list:    Zantac 150 Mg Tabs (Ranitidine hcl) ..... One by mouth two times a day Her updated medication list for this problem includes:    Aciphex 20 Mg Tbec (Rabeprazole sodium) .Marland Kitchen... Take 1 tablet by mouth once a day  Medications Added to Medication List This Visit: 1)  Aciphex 20 Mg Tbec (Rabeprazole sodium) .... Take 1 tablet by mouth once a day  Other Orders: Est. Patient Level IV ([address]) Future Orders: T-CD4SP ([facility]) (CD4SP) ... 04/25/2010 T-HIV Viral Load [phone]) ... 04/25/2010 T-Comprehensive Metabolic Panel [id number]) ... 04/25/2010 T-CBC w/Diff ([id number]) ...  04/25/2010  Prescriptions: ACIPHEX 20 MG TBEC (RABEPRAZOLE SODIUM) Take 1 tablet by mouth once a day  #90 x 3   Entered and Authorized by:   Johny Sax MD   Signed by:   Johny Sax MD on 01/25/2010   Method used:   Electronically to        [facility] [address] [phone]* (retail)       [address]       [place], [place]  [address]       Ph: [phone]  Fax: [phone]   RxID:   [id number] ENSURE   LIQD (NUTRITIONAL SUPPLEMENTS) one three times dail  #24 x prn   Entered and Authorized by:   Johny Sax MD   Signed by:   Johny Sax MD on 01/25/2010   Method used:   Electronically to        [facility] [address] [phone]* (retail)       [address]       [place], [place]  [address]       Ph: [phone]       Fax: [phone]   RxID:   [id number] FLEXERIL 10 MG  TABS (CYCLOBENZAPRINE HCL) as needed two times a day  #60 Tablet x 0   Entered and Authorized by:   Johny Sax MD   Signed by:   Johny Sax MD on 01/25/2010   Method used:   Electronically to        [facility] [address] [phone]* (retail)       [address]       [place], [place]  [address]       Ph: [phone]       Fax: [phone]   RxID:   [id number] PROCARDIA XL 90 MG TB24 (NIFEDIPINE) Take 1 tablet by mouth once a day  #30 Tablet x 3   Entered and Authorized by:   Johny Sax MD   Signed by:   Johny Sax MD on 01/25/2010   Method used:   Electronically to        [facility] [address] [phone]* (retail)       [address]       [place], [place]  [address]       Ph: [phone]       Fax: [phone]   RxID:   [id number] CRIXIVAN 400 MG CAPS (INDINAVIR SULFATE) Take 2 capsules by mouth two times a day  #120 x 6   Entered and Authorized by:   Johny Sax MD   Signed by:   Johny Sax MD on 01/25/2010   Method used:   Electronically to        [facility] [address] [phone]* (retail)       [address]       [place], [place]  [address]       Ph:  [phone]       Fax: [phone]   RxID:   [id number] COMBIVIR 150-300 MG TABS (LAMIVUDINE-ZIDOVUDINE) Take 1 tablet by mouth two times a day  #60 Tablet x 5   Entered and Authorized by:   Johny Sax MD   Signed by:   Johny Sax MD on 01/25/2010   Method used:   Electronically to        [facility] [address] [phone]* (retail)       [address]       [place], [place]  [address]       Ph: [phone]       Fax: [phone]   RxID:   [id number] HYDROCHLOROTHIAZIDE 12.5 MG TABS (HYDROCHLOROTHIAZIDE) Take 1 tablet by mouth once a day  #30 x 3   Entered and Authorized by:   Johny Sax MD   Signed by:   Johny Sax MD on 01/25/2010   Method used:   Electronically to        [facility] [address] [phone]* (retail)       [address]       [place],  [place]  [address]       Ph: [phone]       Fax: [phone]   RxID:   [id number]  Process Orders Check Orders Results:     [facility]: Check successful Tests Sent for requisitioning (January 25, 2010 3:41 PM):     04/25/2010: [facility] -- T-HIV Viral Load [phone] (signed)     04/25/2010: [facility] -- T-Comprehensive Metabolic Panel [[id number]] (signed)     04/25/2010: [facility] -- [facility] w/Diff [[id number]] (signed)   Appended Document: est-per tammy/cfb   Influenza Vaccine    Vaccine Type: Fluvax MCR    Site: left deltoid    Mfr: Novartis    Dose: 0.5 ml    Route: IM    Given by: Kathi Simpers CMA(AAMA)    Exp. Date: 03/09/2010    Lot #: [id number]    VIS given: 07/02/07 version given January 25, 2010.  Flu Vaccine Consent Questions    Do you have a history of severe allergic reactions to this vaccine? no    Any prior history of allergic reactions to egg and/or gelatin? no    Do you have a sensitivity to the preservative Thimersol? no    Do you have a past history of Guillan-Barre Syndrome? no    Do you currently have an  acute febrile illness? no    Have you ever had a severe reaction to latex? no    Vaccine information given and explained to patient? yes    Are you currently pregnant? no   B/P @ 1457 was 218/94 Kathi Simpers [facility])  January 25, 2010 4:23 PM B/P @ 1627 was 221/101 Kathi Simpers [facility])  January 25, 2010 4:31 PM Reported B/P to Dr. Ninetta Lights and was advised to let patient leave and go get B/P meds. Kathi Simpers [facility])  January 25, 2010 4:33 PM    Medication Administration  Medication # 1:    Medication: Clonidine 0.1mg  tab    Diagnosis: HYPERTENSION (ICD-401.9)    Dose: 1 tablet    Route: po    Exp Date: 03/10/2011    Lot #: [id number]    Mfr: [employer]    Comments: B/P @ 1457 was 218/94    Patient tolerated medication without complications    Given by: Kathi Simpers, CMA @ [phone] / 01-25-2010  Orders Added: 1)  Influenza Vaccine MCR [00025] 2)  Clonidine 0.1mg  tab [EMRORAL]

## 2011-01-08 NOTE — Letter (Signed)
Summary: [facility]  [facility]   Imported By: Florinda Marker 07/28/2009 15:24:06  _____________________________________________________________________  External Attachment:    Type:   Image     Comment:   External Document

## 2011-01-08 NOTE — Consult Note (Signed)
Summary: Consultation Report/[facility]/Coladonato,MD  Consultation Report/[facility]/Coladonato,MD   Imported By: Randon Goldsmith 06/22/2007 11:14:36  _____________________________________________________________________  External Attachment:    Type:   Image     Comment:   External Document

## 2011-01-08 NOTE — Miscellaneous (Signed)
Summary: Lab orders  Clinical Lists Changes  Orders: Added new Test order of T-CBC w/Diff [id number]) - Signed Added new Test order of T-CD4 [phone]) - Signed Added new Test order of T-Comprehensive Metabolic Panel [phone]) - Signed Added new Test order of T-HIV Viral Load [phone]) - Signed Added new Test order of T-RPR (Syphilis) [id number]) - Signed Added new Test order of T-Lipid Profile ([id number]) - Signed

## 2011-01-08 NOTE — Assessment & Plan Note (Signed)
Summary: PAP and TB skin test - HPV result, Ref. to GYN   Vital Signs:  Patient Profile:   65 Years Old Female LMP:     02/07/1984  Vitals Entered By: Jennet Maduro RN (March 04, 2008 3:08 PM)  Menstrual History: LMP (date): 02/07/1984               Last PAP Date 03/04/2008  HPV found                                                           Hep C Result:  POS Hep C Viral Load:  1780000                                     Medical History/Family History    Infection History  Patient has been diagnosed with the following opportunistic infections: Previous Viral Load: 420 cmm Previous CD4: 420 cmm   Prior Medications: HYDROCHLOROTHIAZIDE 12.5 MG TABS (HYDROCHLOROTHIAZIDE) Take 1 tablet by mouth once a day COMBIVIR 150-300 MG TABS (LAMIVUDINE-ZIDOVUDINE) Take 1 tablet by mouth two times a day CRIXIVAN 400 MG CAPS (INDINAVIR SULFATE) Take 2 capsules by mouth two times a day NORVIR 100 MG CAPS (RITONAVIR) Take 2 capsules by mouth two times a day PROCARDIA XL 90 MG TB24 (NIFEDIPINE) Take 1 tablet by mouth once a day FLEXERIL 10 MG  TABS (CYCLOBENZAPRINE HCL) as needed two times a day ENSURE   LIQD (NUTRITIONAL SUPPLEMENTS) one three times dail NEURONTIN 300 MG  CAPS (GABAPENTIN) Take 1 tablet by mouth once a day for 1 day then Take 1 tablet by mouth two times a day Current Allergies: ! PENICILLIN  PPD Application    Vaccine Type: PPD    Site: left forearm    Mfr: Sanofi Pasteur    Dose: 0.1 ml    Route: ID    Given by: Jennet Maduro RN    Exp. Date: 02/24/2010    Lot #: [id number]  PPD Results    Date of reading: 03/07/2008    Results: < 5mm    Interpretation: negative   Orders Added: 1)  T-Pap Smear [88150] 2)  TB Skin Test [11914] 3)  Gynecologic Referral [Gyn]   Chief Complaint:  PAP Smear visit - PAP results indicate HPV - Referral to GYN per Dr. Ninetta Lights.     ]

## 2011-01-08 NOTE — Progress Notes (Signed)
Summary: change from aciphex to pepcid?  Phone Note Call from Patient   Summary of Call: Walk in for PPD reading-negative. Patient went to pharmacy and found out that aciphex is not covered by Medicare or Mediciad. Wants to switch back to pepcid. will contact dr.fitzgerald, clinic doctor for orders. Kathi Simpers [facility])  June 26, 2009 8:49 AM   Follow-up for Phone Call        Will try zantac.  if problem contines willneed to f/u with Dr Ninetta Lights Follow-up by: Clydie Braun MD,  June 26, 2009 3:41 PM    New/Updated Medications: ZANTAC 150 MG TABS (RANITIDINE HCL) one by mouth two times a day Prescriptions: ZANTAC 150 MG TABS (RANITIDINE HCL) one by mouth two times a day  #60 x 1   Entered and Authorized by:   Clydie Braun MD   Signed by:   Clydie Braun MD on 06/26/2009   Method used:   Electronically to        [facility]  [id number]* (retail)       [address]       [place], [place]  [address]       Ph: [phone] or [phone]       Fax: [phone]   RxID:   [id number]    PPD Results    Date of reading: 06/26/2009    Results: < 5mm    Interpretation: negative

## 2011-01-08 NOTE — Miscellaneous (Signed)
Summary: Orders Update - labs  Clinical Lists Changes  Orders: Added new Test order of T-CBC w/Diff [phone]) - Signed Added new Test order of T-CD4SP [facility]) (CD4SP) - Signed Added new Test order of T-Comprehensive Metabolic Panel [id number]) - Signed Added new Test order of T-HIV Viral Load [phone]) - Signed     Process Orders Check Orders Results:     [facility]: Check successful Order queued for requisitioning for [facility]: June 06, 2010 10:17 AM  Tests Sent for requisitioning (June 06, 2010 10:17 AM):     06/06/2010: [facility] -- T-CBC w/Diff [[id number]] (signed)     06/06/2010: [facility] -- T-Comprehensive Metabolic Panel [[id number]] (signed)     06/06/2010: [facility] -- T-HIV Viral Load [phone] (signed)

## 2011-01-08 NOTE — Progress Notes (Signed)
Summary: Prior authorizatin for [employer] Note From Pharmacy   Caller: [employer] [address] Request: Needs authorization from insurer Summary of Call: Received a prior authorization fax request for the drug Aciphex.  Her insurance company is requiring additional information from the physician's office before they pay for this particular mediation.  Please have MD to call [phone] to receive approval/denial and then inform [facility] on [address]. Patient's Id [id number]. Initial call taken by: Paulo Fruit  BS,CPht II,MPH,  June 19, 2009 12:24 PM  Follow-up for Phone Call        spoke to Vivia Birmingham, a representative, for [employer] plan who is faxing over a form to be completed by physician office for the prior authorization for Aciphex. Follow-up by: Paulo Fruit  BS,CPht II,MPH,  June 19, 2009 12:28 PM  Additional Follow-up for Phone Call Additional follow up Details #1::        Received the approval on patient's Aciphex.  This information was faxed to the [facility] on [address].  Just found out that patient is also receiving Zantac.  I assume it took to long for her insurance company to approve her medicaton while I was out.  Patient should not be taking both.

## 2011-01-08 NOTE — Progress Notes (Signed)
Summary: Ensure refill  Phone Note Call from Patient Call back at Home Phone [phone]   Caller: Patient Call For: Johny Sax MD Reason for Call: Refill Medication Summary of Call: Phone message.  Requesting Ensure refill be faxed to [facility].  Appt. with Dr. Ninetta Lights Nov. 2, 2009. Will print rx for MD signature.   Jennet Maduro RN  September 05, 2008 10:25 AM.      Prescriptions: ENSURE   LIQD (NUTRITIONAL SUPPLEMENTS) one three times dail  #24 x prn   Entered by:   Jennet Maduro RN   Authorized by:   Johny Sax MD   Signed by:   Jennet Maduro RN on 09/05/2008   Method used:   Printed then faxed to ...       [facility]  [id number]* (retail)       [address]       [place], [place]  [address]       Ph: [phone] or [phone]       Fax: [phone]   RxID:   [id number]

## 2011-01-08 NOTE — Letter (Signed)
Summary: [place] [employer]: PCS  [place] [employer]: PCS   Imported By: Florinda Marker 10/17/2010 10:27:09  _____________________________________________________________________  External Attachment:    Type:   Image     Comment:   External Document

## 2011-01-08 NOTE — Letter (Signed)
Summary: Results Follow-up Letter  [facility]  [address]   [place], [place] [address]   Phone: [phone]  Fax: [phone]        March 16, 2008  [address] [place], [place]  [address]  Dear [name],   The following are the results of your recent test(s):  Test     Result     Pap Smear    Normal_______  Not Normal__XXX       Comments:  Dr. Ninetta Lights has referred you to [facility].  Your appointment is as follows:   Friday, Apr 08, 2008 @ 8:45 AM     To change/reschedule this appointment please call [phone].  Please      bring any insurance cards, including your [name] card to this      appointment.  Arrive approximately 15 minutes early to complete      paperwork.  Thank you.  Sincerely,    Jennet Maduro RN [facility]

## 2011-01-08 NOTE — Assessment & Plan Note (Signed)
Summary: f/u ov   Chief Complaint:  F/U OV  .  History of Present Illness: 65 yo F with hx of HIV+, HTN and GERD.  Last CD4 600 and VL 192 (10-09).  Taking medicine well. states she is not takingher RTV- gives her GI upset.  states she had pap in may and that they said it was NL and told her to leave her alone.  having some pain not relieved with flexeril. states she has good days and bad days, attributes this to mood swings.  Flu Vaccine Consent Questions     Do you have a history of severe allergic reactions to this vaccine? no    Any prior history of allergic reactions to egg and/or gelatin? no    Do you have a sensitivity to the preservative Thimersol? no    Do you have a past history of Guillan-Barre Syndrome? no    Do you currently have an acute febrile illness? no    Have you ever had a severe reaction to latex? no    Vaccine information given and explained to patient? yes    Are you currently pregnant? no    Lot Number:[id number]   Exp Date:06/07/2009  Tomasita Morrow RN  October 10, 2008 10:15 AM    Site Given  Left Deltoid IM    Updated Prior Medication List: HYDROCHLOROTHIAZIDE 12.5 MG TABS (HYDROCHLOROTHIAZIDE) Take 1 tablet by mouth once a day COMBIVIR 150-300 MG TABS (LAMIVUDINE-ZIDOVUDINE) Take 1 tablet by mouth two times a day CRIXIVAN 400 MG CAPS (INDINAVIR SULFATE) Take 2 capsules by mouth two times a day NORVIR 100 MG CAPS (RITONAVIR) Take 2 capsules by mouth two times a day PROCARDIA XL 90 MG TB24 (NIFEDIPINE) Take 1 tablet by mouth once a day FLEXERIL 10 MG  TABS (CYCLOBENZAPRINE HCL) as needed two times a day ENSURE   LIQD (NUTRITIONAL SUPPLEMENTS) one three times dail NEURONTIN 300 MG  CAPS (GABAPENTIN) Take 1 tablet by mouth once a day for 1 day then Take 1 tablet by mouth two times a day ACIPHEX 20 MG  TBEC (RABEPRAZOLE SODIUM) Take 1 tablet by mouth once a day  Current Allergies (reviewed today): ! PENICILLIN   Family History:    denies.parents living,  daughters local. siblings died from ESRD, brain thing, 1 brother and sister died from AIDS.   Social History:    Current Smoker    Alcohol use-yes- seldom    Drug use-no   Risk Factors:  Tobacco use:  current    Cigarettes:  Yes -- 1/2 pack(s) per day Passive smoke exposure:  yes Drug use:  no HIV high-risk behavior:  no Caffeine use:  5+ drinks per day Alcohol use:  yes    Type:  beer    Drinks per day:  <1 Exercise:  yes    Times per week:  7    Type:  walking, moderately Seatbelt use:  100 %  PAP Smear History:    Date of Last PAP Smear:  03/14/2008   Review of Systems       nl bm, nl urination, wt steady,    Vital Signs:  Patient Profile:   65 Years Old Female Height:     62 inches (157.48 cm) Weight:      136.9 pounds BMI:     25.13 Temp:     97.4 degrees F oral Pulse rate:   71 / minute BP sitting:   139 / 79  (left arm)  Pt. in pain?  no  Vitals Entered By: Tomasita Morrow RN (October 10, 2008 10:07 AM)              Nutritional Status BMI of 25 - 29 = overweight Nutritional Status Detail NAUSEA   Have you ever been in a relationship where you felt threatened, hurt or afraid?No  Domestic Violence Intervention NONE  Does patient need assistance? Functional Status Self care Ambulation Normal Comments Missed several dose of HAART per pt     Physical Exam  General:     well-developed, well-nourished, and well-hydrated.   Eyes:     pupils equal, pupils round, and pupils reactive to light.   Mouth:     pharynx pink and moist, poor dentition, and teeth missing.   Neck:     no masses.   Lungs:     normal respiratory effort and normal breath sounds.   Heart:     normal rate, regular rhythm, and no murmur.   Abdomen:     soft, non-tender, and normal bowel sounds.      Impression & Recommendations:  Problem # 1:  HIV DISEASE (ICD-042) she is doing well despite not taking her medications as written. she s afraid to change her meds and we  agree to cont to watch her on her current rx.  will recheck her labs prior to her next visit in the new year.  not sexually active.  Her updated medication list for this problem includes:    Combivir 150-300 Mg Tabs (Lamivudine-zidovudine) .Marland Kitchen... Take 1 tablet by mouth two times a day    Crixivan 400 Mg Caps (Indinavir sulfate) .Marland Kitchen... Take 2 capsules by mouth two times a day    Norvir 100 Mg Caps (Ritonavir) .Marland Kitchen... Take 2 capsules by mouth two times a day   Problem # 2:  CARIES, DENTAL NEC (ICD-521.09) she is trying to get into a dental clinic.   Problem # 3:  HYPERTENSION (ICD-401.9) no hange in medications.  Her updated medication list for this problem includes:    Hydrochlorothiazide 12.5 Mg Tabs (Hydrochlorothiazide) .Marland Kitchen... Take 1 tablet by mouth once a day    Procardia Xl 90 Mg Tb24 (Nifedipine) .Marland Kitchen... Take 1 tablet by mouth once a day   Problem # 4:  GERD (ICD-530.81) will cont to watch.  The following medications were removed from the medication list:    Aciphex 20 Mg Tbec (Rabeprazole sodium) .Marland Kitchen... Take 1 tablet by mouth once a day  Her updated medication list for this problem includes:    Prilosec 20 Mg Cpdr (Omeprazole) .Marland Kitchen... Take 1 tablet by mouth once a day   Problem # 5:  OTH ABNORMAL PAP SMEAR OF VAGINA AND VAGINAL HPV (ICD-795.19)  Medications Added to Medication List This Visit: 1)  Crixivan 400 Mg Caps (Indinavir sulfate) .... Take 2 capsules by mouth two times a day 2)  Prilosec 20 Mg Cpdr (Omeprazole) .... Take 1 tablet by mouth once a day  Other Orders: Admin 1st Vaccine ([address]) Flu Vaccine 2yrs + ([address]) Est. Patient Level IV (09811)  Future Orders: T-CD4 ([id number]) ... 01/08/2009 T-HIV Viral Load [id number]) ... 01/08/2009 T-Comprehensive Metabolic Panel [id number]) ... 01/08/2009 T-CBC w/Diff ([id number]) ... 01/08/2009 T-RPR (Syphilis) [id number]) ... 01/08/2009 T-Lipid Profile [id number]) ... 01/08/2009      Prescriptions: CRIXIVAN  400 MG CAPS (INDINAVIR SULFATE) Take 2 capsules by mouth two times a day  #120 x 6   Entered and Authorized by:   Johny Sax MD   Signed by:  Johny Sax MD on 10/10/2008   Method used:   Electronically to        [id number] (retail)       [address]       [place], [place]  [address]       Ph: [phone] or [phone]       Fax: [phone]   RxID:   [id number] PRILOSEC 20 MG CPDR (OMEPRAZOLE) Take 1 tablet by mouth once a day  #90 x 3   Entered and Authorized by:   Johny Sax MD   Signed by:   Johny Sax MD on 10/10/2008   Method used:   Electronically to        [id number]* (retail)       [address]       [place], [place]  [address]       Ph: [phone] or [phone]       Fax: [phone]   RxID:   [id number]  ]

## 2011-01-08 NOTE — Miscellaneous (Signed)
Summary: Orders Update  Clinical Lists Changes  Orders: Added new Test order of T-CBC w/Diff [id number]) - Signed Added new Test order of T-CD4SP [facility]) (CD4SP) - Signed Added new Test order of T-Comprehensive Metabolic Panel [id number]) - Signed Added new Test order of T-RPR (Syphilis) [id number]) - Signed Added new Test order of T-Lipid Profile ([id number]) - Signed Added new Test order of T-Chlamydia & GC Probe, Urine ([id number]) - Signed     Process Orders Check Orders Results:     [facility]: Check successful Order queued for requisitioning for [facility]: January 01, 2010 9:47 AM  Tests Sent for requisitioning (January 01, 2010 9:47 AM):     01/01/2010: [facility] -- T-CBC w/Diff [[id number]] (signed)     01/01/2010: [facility] -- T-Comprehensive Metabolic Panel [[id number]] (signed)     01/01/2010: [facility] -- T-RPR (Syphilis) [id number] (signed)     01/01/2010: [facility] -- T-Lipid Profile [id number] (signed)     01/01/2010: [facility] -- T-Chlamydia & GC Probe, Urine [[id number]] (signed)   Appended Document: Lab Order    Lab Visit  Orders Today: T-HIV Viral Load [id number]   Process Orders Check Orders Results:     [facility]: Check successful Tests Sent for requisitioning (January 10, 2010 9:30 AM):     01/01/2010: [facility] -- T-HIV Viral Load [id number] (signed)

## 2011-01-08 NOTE — Progress Notes (Signed)
Summary: phone note re: referral to [facility]  Phone Note Other Incoming Call back at [phone]   Request: Send information Action Taken: Appt scheduled Summary of Call: Pt. scheduled with Dr. Reynolds Bowl for March 31,2008 at 2pm.  Waiting on referral response Initial call taken by: Jennet Maduro RN,  February 12, 2007 4:34 PM  Follow-up for Phone Call        Appointment scheduled Follow-up by: Jennet Maduro RN,  February 12, 2007 4:35 PM  Additional Follow-up for Phone Call Additional follow up Details #1::        Pt. did not keep the scheduled appt.  Call from [facility] stating that pt. was rescheduled one last time for June 5th @ 3pm with Dr. Rich Reining Additional Follow-up by: Jennet Maduro RN,  Apr 23, 2007 11:07 AM

## 2011-01-08 NOTE — Letter (Signed)
Summary: [facility]  [facility]   Imported By: Florinda Marker 10/02/2009 14:57:10  _____________________________________________________________________  External Attachment:    Type:   Image     Comment:   External Document

## 2011-01-08 NOTE — Assessment & Plan Note (Signed)
Summary: est-ck/fu/meds/cfb   CC:  follow-up visit.  History of Present Illness: 65 yo F with hx of HIV+, HTN and GERD.  Last CD4 460 and VL 275 (06-05-09). Has lost 11# since November- states it is too hot to eat. has had continued pain in her LE. has not had good relief from the flexeril for this.  Was seen in [facility] clinic in early spring and was told that her pap was abn but it was nothing.   is preparing for her 63th b-day.    Preventive Screening-Counseling & Management  Alcohol-Tobacco     Alcohol drinks/day: 0     Smoking Status: current     Packs/Day: 0.75  Caffeine-Diet-Exercise     Caffeine use/day: coffee     Does Patient Exercise: yes     Type of exercise: walking     Exercise (avg: min/session): 30-60     Times/week: 6  Safety-Violence-Falls     Seat Belt Use: yes   Updated Prior Medication List: HYDROCHLOROTHIAZIDE 12.5 MG TABS (HYDROCHLOROTHIAZIDE) Take 1 tablet by mouth once a day COMBIVIR 150-300 MG TABS (LAMIVUDINE-ZIDOVUDINE) Take 1 tablet by mouth two times a day CRIXIVAN 400 MG CAPS (INDINAVIR SULFATE) Take 2 capsules by mouth two times a day PROCARDIA XL 90 MG TB24 (NIFEDIPINE) Take 1 tablet by mouth once a day FLEXERIL 10 MG  TABS (CYCLOBENZAPRINE HCL) as needed two times a day ENSURE   LIQD (NUTRITIONAL SUPPLEMENTS) one three times dail NEURONTIN 300 MG  CAPS (GABAPENTIN) Take 1 tablet by mouth once a day for 1 day then Take 1 tablet by mouth two times a day ALPRAZOLAM 0.5 MG TABS (ALPRAZOLAM) one evry 8 hours as needed. ACIPHEX 20 MG TBEC (RABEPRAZOLE SODIUM) Take 1 tablet by mouth once a day  Current Allergies (reviewed today): ! PENICILLIN Past History:  Past medical, surgical, family and social histories (including risk factors) reviewed, and no changes noted (except as noted below).  Past Medical History: Reviewed history from 01/23/2007 and no changes required. HIV disease Hypertension GERD Cataract, bilateral Restless leg  syndrome Tobacco abuse  Past Surgical History: Reviewed history from 01/23/2007 and no changes required. Hysterectomy  Family History: Reviewed history from 10/10/2008 and no changes required. denies.parents living, daughters local. siblings died from ESRD, brain thing, 1 brother and sister died from AIDS.   Social History: Reviewed history from 10/10/2008 and no changes required. Current Smoker Alcohol use-yes- seldom Drug use-no  Review of Systems       cont to smoke, going to dentist for removal of lower teeth,   Vital Signs:  Patient profile:   65 year old female Height:      63 inches (160.02 cm) Weight:      125.0 pounds (56.82 kg) BMI:     22.22 Temp:     97.4 degrees F (36.33 degrees C) oral Pulse rate:   57 / minute BP sitting:   184 / 90  (right arm)  Vitals Entered By: Baxter Hire) (June 19, 2009 10:06 AM) CC: follow-up visit Is Patient Diabetic? No Pain Assessment Patient in pain? no      Nutritional Status BMI of 19 -24 = normal Nutritional Status Detail appetite is pretty good per patient  Have you ever been in a relationship where you felt threatened, hurt or afraid?No   Does patient need assistance? Functional Status Self care Ambulation Normal Comments patient says that they have missed some doses of meds.   Physical Exam  General:  well-developed, well-nourished,  and well-hydrated.   Eyes:  pupils equal, pupils round, and pupils reactive to light.   Mouth:  pharynx pink and moist, no exudates, poor dentition, and teeth missing.  tobacco stains.  Lungs:  normal respiratory effort and normal breath sounds.   Heart:  normal rate, regular rhythm, and no murmur.   Abdomen:  soft, non-tender, and normal bowel sounds.   Cervical Nodes:  R anterior LN enlarged and L anterior LN enlarged.          Medication Adherence: 06/19/2009   Adherence to medications reviewed with patient. Counseling to provide adequate adherence provided     Prevention For Positives: 06/19/2009   Safe sex practices discussed with patient. Condoms offered.                            Impression & Recommendations:  Problem # 1:  HIV DISEASE (ICD-042)  she is doing well on an atypical regiemen. would cont this. offered. condoms. a ensure rx is written. she is missing some doses, she attributes this to the heat. return to clinic 4 months.  Diagnostics Reviewed:  CD4: 460 (06/06/2009)   CD4 %: 13 (02/11/2007) WBC: 5.7 (06/05/2009)   Hgb: 10.7 (06/05/2009)   HCT: 33.9 (06/05/2009)   Platelets: 313 (06/05/2009) HIV-1 RNA: 275 (06/05/2009)   HBSAg: No (02/02/2007)  Problem # 2:  HYPERTENSION (ICD-401.9) will have her eval by IM.  Her updated medication list for this problem includes:    Hydrochlorothiazide 12.5 Mg Tabs (Hydrochlorothiazide) .Marland Kitchen... Take 1 tablet by mouth once a day    Procardia Xl 90 Mg Tb24 (Nifedipine) .Marland Kitchen... Take 1 tablet by mouth once a day  Problem # 3:  CARIES, DENTAL NEC (ICD-521.09) will f/u with dental.   Problem # 4:  DYSPLASIA, CERVIX NOS (ICD-622.10) she was told she needs no further f/u for this. [facility]   Problem # 5:  TOBACCO ABUSE (ICD-305.1) encouraged to quit.   Medications Added to Medication List This Visit: 1)  Neurontin 300 Mg Caps (Gabapentin) .... Take 1 tablet by mouth once a day for 1 day then take 1 tablet by mouth two times a day 2)  Aciphex 20 Mg Tbec (Rabeprazole sodium) .... Take 1 tablet by mouth once a day  Other Orders: Est. Patient Level IV (91478) Future Orders: T-CD4SP ([facility]) (CD4SP) ... 09/17/2009 T-HIV Viral Load [id number]) ... 09/17/2009 T-Comprehensive Metabolic Panel [id number]) ... 09/17/2009 T-CBC w/Diff ([id number]) ... 09/17/2009  Prescriptions: ACIPHEX 20 MG TBEC (RABEPRAZOLE SODIUM) Take 1 tablet by mouth once a day  #30 x 5   Entered and Authorized by:   Johny Sax MD   Signed by:   Johny Sax MD on 06/19/2009    Method used:   Electronically to        [facility] [phone]* (retail)       [address] [place], [place]  [address]       Ph: [phone] or [phone]       Fax: [phone]   RxID:   [id number] NEURONTIN 300 MG  CAPS (GABAPENTIN) Take 1 tablet by mouth once a day for 1 day then Take 1 tablet by mouth two times a day  #120 x 3   Entered and Authorized by:   Johny Sax MD   Signed by:   Johny Sax MD on 06/19/2009   Method used:  Electronically to        [facility] [phone]* (retail)       [address] [place], [place]  [address]       Ph: [phone] or [phone]       Fax: [phone]   RxID:   [id number]

## 2011-01-08 NOTE — Progress Notes (Signed)
Summary: Ensure request/ Discuss ED OV  Phone Note Call from Patient   Summary of Call: Pt called c/o confused with appt time yesterday and came too early. She did not want to wait and went to ED for eval. diagnsosed with Pneumonia. Pt states she has lost 15 lbs in last 2 months and has requested a script or Ensure for THP. I will forward request to Dr. Ninetta Lights. Initial call taken by: Tomasita Morrow RN,  October 21, 2007 11:28 AM  Follow-up for Phone Call        ok per Dr Graceann Congress Script faxed to [facility] on 10-23-07 Follow-up by: Tomasita Morrow RN,  October 23, 2007 11:07 AM    New/Updated Medications: ENSURE   LIQD (NUTRITIONAL SUPPLEMENTS) one three times dail   Prescriptions: ENSURE   LIQD (NUTRITIONAL SUPPLEMENTS) one three times dail  #24 x prn   Entered by:   Tomasita Morrow RN   Authorized by:   Johny Sax MD   Signed by:   Tomasita Morrow RN on 10/23/2007   Method used:   Print then Give to Patient   RxID:   [id number] ENSURE   LIQD (NUTRITIONAL SUPPLEMENTS) one three times dail  #24 x prn   Entered and Authorized by:   Tomasita Morrow RN   Signed by:   Tomasita Morrow RN on 10/23/2007   Method used:   Print then Give to Patient   RxID:   [id number]

## 2011-01-08 NOTE — Progress Notes (Signed)
Summary: Wt. loss, increased B/P  Phone Note Call from Patient Call back at [facility] Phone [phone]   Caller: Patient Reason for Call: Acute Illness Action Taken: Phone Call Completed, Appt Scheduled Summary of Call: Weight loss, increased B/P, unable to eat.  Appt. w/ Dr. Philipp Deputy 10/20/07 @ 1545.  Pt. requesting rx for Ensure to help with intake. Initial call taken by: Jennet Maduro RN,  October 14, 2007 3:39 PM

## 2011-01-08 NOTE — Miscellaneous (Signed)
[facility]  [facility]   Imported By: Florinda Marker 04/22/2008 11:53:34  _____________________________________________________________________  External Attachment:    Type:   Image     Comment:   External Document

## 2011-01-08 NOTE — Consult Note (Signed)
Summary: [facility] [place] Clinic  [facility] [place]: G[facility] Clinic   Imported By: Florinda Marker 05/24/2008 14:26:53  _____________________________________________________________________  External Attachment:    Type:   Image     Comment:   External Document

## 2011-01-10 NOTE — Initial Assessments (Signed)
INTERNAL MEDICINE ADMISSION HISTORY AND PHYSICAL  PCP: Dr. Ninetta Lights  1st Contact Nicole Mccormick [phone] 2nd contact Dr. Tonny Branch [phone] 3rd contact Dr Gilford Rile [phone] Holidays or 5pm on weekdays:  1st contact [phone] 2nd contact [phone]  CC: High blood pressure  HPI: Patient is a 65 year old female with a history of HTN, GERD, and HIV positive who presents via transfer from Dr. Ninetta Lights due to concerns for elevated blood pressure readings.  She is not currently taking her HCTZ.  Currently complains of dyspnea on exertion, parasthesias in the L arm on exertion, and some chest pain on exertion which she describes as throbbing and occassionally stabbing for a variable amount of time.  At this time has a slight headache.  HIV is being monitored by Dr. Ninetta Lights with most recent CD4 490 and HIV viral load 762 in January 2012.  Admits to smoking one half pack per day, recent cocaine use about one month ago, and an occassional single acoholic beverage.  She is compliant with the remainder of her medication regimen.  ALLERGIES: ! PENICILLIN   PAST MEDICAL HISTORY: HIV disease Hypertension GERD Cataract, bilateral Restless leg syndrome Tobacco abuse   MEDICATIONS: COMBIVIR 150-300 MG TABS (LAMIVUDINE-ZIDOVUDINE) Take 1 tablet by mouth two times a day CRIXIVAN 400 MG CAPS (INDINAVIR SULFATE) Take 2 capsules by mouth two times a day PROCARDIA XL 90 MG TB24 (NIFEDIPINE) Take 1 tablet by mouth once a day FLEXERIL 10 MG  TABS (CYCLOBENZAPRINE HCL) as needed two times a day ENSURE   LIQD (NUTRITIONAL SUPPLEMENTS) one three times dail NEURONTIN 300 MG  CAPS (GABAPENTIN) Take 1 tablet by mouth once a day for 1 day then Take 1 tablet by mouth two times a day ALPRAZOLAM 0.5 MG TABS (ALPRAZOLAM) one evry 8 hours as needed. ACIPHEX 20 MG TBEC (RABEPRAZOLE SODIUM) Take 1 tablet by mouth once a day   SOCIAL HISTORY: Current Smoker - one half pack per day.  Smoker for 52 years. Alcohol use-yes-  seldom, one drink on occassion Drug use- admits to cocaine use about 1 month ago   FAMILY HISTORY: Denies parents living, daughters local. Siblings died from ESRD, "brain thing", 1 brother and sister died from AIDS.   ROS: As per HPI, all other systems reviewed and negative  VITALS: T:  97.6 P: 63  BP: 214/109  R: 18 O2SAT: 99% ON: RA  PHYSICAL EXAM: General:  alert, well-developed, and cooperative to examination. No apparant distress.  Head:  normocephalic and atraumatic.   Eyes:  vision grossly intact, pupils equal, pupils round, pupils reactive to light, no injection and anicteric.   Mouth:  pharynx pink and moist, no erythema, and no exudates.   Neck:  supple, full ROM, no thyromegaly, no JVD, and no carotid bruits.   Lungs:  normal respiratory effort, no accessory muscle use, normal breath sounds, no crackles, wheezes present lung bases bilaterally Heart:  normal rate, regular rhythm, 2+ systolic murmur, no gallop, and no rub.   Abdomen:  soft, non-tender, normal bowel sounds, no distention, no guarding, no rebound tenderness, no renal artery or aoritc bruits no hepatomegaly, and no splenomegaly.   Msk:  no joint swelling, no joint warmth, and no redness over joints.   Pulses:  2+ DP/PT pulses bilaterally Extremities:  No cyanosis, clubbing, edema Neurologic:  alert & oriented X3, cranial nerves II-XII intact, strength normal in all extremities, sensation intact to light touch, and gait normal.   Skin:  turgor normal and no rashes.   Psych:  Oriented X3, memory intact for recent and remote, normally interactive, good eye contact, not anxious appearing, and not depressed appearing.  LABS:  WBC                                      6.8               4.0-10.5         K/uL  RBC                                      4.15              3.87-5.11        MIL/uL  Hemoglobin (HGB)                         10.5       l      12.0-15.0        g/dL  Hematocrit (HCT)                         34.6        l      36.0-46.0        %  MCV                                      83.4              78.0-100.0       fL  MCH -                                    25.3       l      26.0-34.0        pg  MCHC                                     30.3              30.0-36.0        g/dL  RDW                                      19.4       h      11.5-15.5        %  Platelet Count (PLT)                     310               150-400          K/uL  Neutrophils, %                           36         l      43-77            %  Lymphocytes, %  53         h      12-46            %  Monocytes, %                             9                 3-12             %  Eosinophils, %                           1                 0-5              %  Basophils, %                             0                 0-1              %  Neutrophils, Absolute                    2.5               1.7-7.7          K/uL  Lymphocytes, Absolute                    3.6               0.7-4.0          K/uL  Monocytes, Absolute                      0.6               0.1-1.0          K/uL  Eosinophils, Absolute                    0.1               0.0-0.7          K/uL  Basophils, Absolute                      0.0               0.0-0.1          K/uL   Sodium (NA)                              136               135-145          mEq/L  Potassium (K)                            3.5               3.5-5.1          mEq/L  Chloride  102               96-112           mEq/L  CO2                                      28                19-32            mEq/L  Glucose                                  94                70-99            mg/dL  BUN                                      11                6-23             mg/dL  Creatinine                               0.64              0.4-1.2          mg/dL  GFR, Est Non African American            >60               >60              mL/min  GFR, Est African  American                >60               >60              mL/min    Oversized comment, see footnote  1  Bilirubin, Total                         0.2        l      0.3-1.2          mg/dL  Alkaline Phosphatase                     98                39-117           U/L  SGOT (AST)                               35                0-37             U/L  SGPT (ALT)                               25  0-35             U/L  Total  Protein                           8.7        h      6.0-8.3          g/dL  Albumin-Blood                            3.2        l      3.5-5.2          g/dL  Calcium                                  8.9               8.4-10.5         mg/dL   Creatine Kinase, Total                   111               7-177            U/L  CK, MB                                   2.0               0.3-4.0          ng/mL  Relative Index                           1.8               0.0-2.5  Troponin I                               0.03              0.00-0.06        ng/mL   PORTABLE CHEST - 1 VIEW    Comparison: 10/20/2007    Findings: The heart size and pulmonary vascularity are normal and   the lungs are clear.  No osseous abnormality.    IMPRESSION:   Normal chest.   ASSESSMENT AND PLAN: Problem # 1: Hypertension Patient was referred to the IM Teaching service for admission due to elevations of BP in clinic today.  Admission vitals indicated a blood pressure of 214/109.  Is not taking her HCTZ though she does take her Nicardipine.  She currently complains of headache, dyspnea on exertion, chest pain on exertion, and parasthesias on exertion.  Admits to smoking one half pack per day and cocaine use about 1 month ago.  On exam, pulses are 2+ in all 4 extremities. Cardiac exam with a 2/6 systolic murmur.  Point of care cardiac enzymes were negative.  WBC WNL at 6.8.  Portable AP chest X-ray indicated normal heart size and pulmonary vasculature.  Repeat blood pressure measurements  showed R arm SBP 230 and left arm SBP 190.  Will order chest CT with angio to rule out dissection and UA with UDS to check for illicit drug  use.  Plan to start nitroglycerin drip  and titrate for SBP to 160-180.  Will monitor for dizziness and headache.  Repeat EKG, CBC, and CMet in the AM.  We will also continue to trend her cardiac enzymes. Also will check 2D Echo to r/o wall motion abnormalities.   Problem # 2: Wheezing,  with a questionable history of asthma/COPD. Pateint admits to history of asthma and some cough on occasion.   On exam, had wheezing in bibasilar lungs with normal work of breathing.  Plan to start Albuterol/Atrovent Nebs and will re-assess respiratiory exam in the AM.  Will need to assess with PFTs as an outpatient. No fever, no leukocytosis, portable CXR clear make bronchitis unlikely and we are not treating with antiobiotics at this time.  Problem # 3: Anemia Admission labs indicate a Hgb of 10.5 (L), MCV of 83.4, and RDW 19.4 (H).  Pateint denies a history of anemia and is not symptomatic.  Possible iron deficiency due to elevated RDW despite normocytic anemia.  Will order anemia panel and plan to monitor CBC through course of admission.  Problem # 4:  HIV Most recent CD4 count 490 with HIV viral load at 762.  Per Dr. Ninetta Lights, the patient needs to switch perscriptions to ATVr/TRV or other simpler, once daily regiemen. Plan to facilitate this once she is out of hospital.      Darnelle Maffucci, MD  R2      Bard Herbert, MD  R1      Nicole Mccormick, MS3      ATTENDING: I performed and/or observed a history and physical examination of the patient.  I discussed the case with the residents as noted and reviewed the residents' notes.  I agree with the findings and plan--please refer to the attending physician note for more details.  Signature________________________________  Printed Name_____________________________

## 2011-01-10 NOTE — Assessment & Plan Note (Signed)
Summary: Nicole Mccormick   CC:  hospital followup, pt. missed one dose of Norvir says it upsets her stomach, and has not been taking Prezista since D/C from hospital 1 week ago.  History of Present Illness: 65 yo F with hx of HIV+, HTN and GERD.  Last CD4 490 and VL 762  (Jan-2012).  Was adm 1-17 to 12-27-10 with hypertensive urgency.Had BP meds and also had ART changed. Feels better, would like to try and start walking again. Has been unable to take RTV.   Preventive Screening-Counseling & Management  Alcohol-Tobacco     Alcohol drinks/day: <1     Alcohol type: wine     Smoking Status: current     Smoking Cessation Counseling: yes     Packs/Day: 0.5     Year Started: 1962     Passive Smoke Exposure: yes  Caffeine-Diet-Exercise     Caffeine use/day: coffee and tea     Does Patient Exercise: yes     Type of exercise: walking when can     Exercise (avg: min/session): 30-60     Times/week: 6  Hep-HIV-STD-Contraception     HIV Risk: no     HIV Risk Counseling: 01/29/2006  Safety-Violence-Falls     Seat Belt Use: yes      Drug Use:  no.    Comments: pt. declined condoms   Updated Prior Medication List: PROCARDIA XL 90 MG TB24 (NIFEDIPINE) Take 1 tablet by mouth once a day FLEXERIL 10 MG  TABS (CYCLOBENZAPRINE HCL) as needed two times a day ENSURE   LIQD (NUTRITIONAL SUPPLEMENTS) one three times dail NEURONTIN 300 MG  CAPS (GABAPENTIN) Take 1 tablet by mouth two times per day. ALPRAZOLAM 0.5 MG TABS (ALPRAZOLAM) one evry 8 hours as needed. ACIPHEX 20 MG TBEC (RABEPRAZOLE SODIUM) Take 1 tablet by mouth once a day LISINOPRIL 20 MG TABS (LISINOPRIL) Take one tablet by mouth daily TRUVADA 200-300 MG TABS (EMTRICITABINE-TENOFOVIR) Take 1 tablet by mouth once a day FERROUS SULFATE 325 (65 FE) MG TABS (FERROUS SULFATE) Take 1 tab by mouth each day FLOVENT HFA 110 MCG/ACT AERO (FLUTICASONE PROPIONATE  HFA) 1 Puff twice daily. REYATAZ 200 MG CAPS (ATAZANAVIR SULFATE) 2 tab by mouth once daily.  space 12 hours from antacids  Current Allergies (reviewed today): ! PENICILLIN Past History:  Past medical, surgical, family and social histories (including risk factors) reviewed, and no changes noted (except as noted below).  Past Medical History: Reviewed history from 01/23/2007 and no changes required. HIV disease Hypertension GERD Cataract, bilateral Restless leg syndrome Tobacco abuse  Past Surgical History: Reviewed history from 01/23/2007 and no changes required. Hysterectomy  Family History: Reviewed history from 10/10/2008 and no changes required. denies.parents living, daughters local. siblings died from ESRD, brain thing, 1 brother and sister died from AIDS.   Social History: Reviewed history from 10/10/2008 and no changes required. Current Smoker Alcohol use-yes- seldom Drug use-no  Review of Systems       down to <1/2ppd to <1/4ppd. nl vision, no headache, no CP.   Vital Signs:  Patient profile:   65 year old female Menstrual status:  postmenopausal Height:      63 inches Weight:      142.8 pounds BMI:     25.39 Temp:     98.0 degrees F oral Pulse rate:   85 / minute BP sitting:   191 / 79  (right arm)  Vitals Entered By: Wendall Mola CMA Duncan Dull) (January 02, 2011 11:24 AM) CC: hospital  followup, pt. missed one dose of Norvir says it upsets her stomach, has not been taking Prezista since D/C from hospital 1 week ago Is Patient Diabetic? No Pain Assessment Patient in pain? no      Nutritional Status BMI of 25 - 29 = overweight Nutritional Status Detail appetite "normal"  Have you ever been in a relationship where you felt threatened, hurt or afraid?No   Does patient need assistance? Functional Status Self care Ambulation Normal Comments pt. has not picked up Prezista from pharmacy    Physical Exam  General:  well-developed, well-nourished, and well-hydrated.   Eyes:  pupils equal, pupils round, and pupils reactive to light.     Mouth:  pharynx pink and moist and no exudates.   Neck:  no masses.  tenderness on R upper neck. no masses.  Lungs:  normal respiratory effort and normal breath sounds.   Heart:  normal rate, regular rhythm, and no murmur.   Abdomen:  soft, non-tender, and normal bowel sounds.     Impression & Recommendations:  Problem # 1:  HIV DISEASE (ICD-042) will achnge her ART to ATV without RTV. this is off guidelines but will hopefully help her tolerate it better. return to clinic 1-2 months  Problem # 2:  HYPERTENSION, SEVERE (ICD-401.0) will have her f/u with IM asap Her updated medication list for this problem includes:    Procardia Xl 90 Mg Tb24 (Nifedipine) .Marland Kitchen... Take 1 tablet by mouth once a day    Lisinopril 20 Mg Tabs (Lisinopril) .Marland Kitchen... Take one tablet by mouth daily  Medications Added to Medication List This Visit: 1)  Reyataz 200 Mg Caps (Atazanavir sulfate) .... 2 tab by mouth once daily. space 12 hours from antacids  Other Orders: Est. Patient Level IV (09811)  Prescriptions: REYATAZ 200 MG CAPS (ATAZANAVIR SULFATE) 2 tab by mouth once daily. space 12 hours from antacids  #60 x 6   Entered and Authorized by:   Johny Sax MD   Signed by:   Johny Sax MD on 01/02/2011   Method used:   Electronically to        [facility] [address] [phone]* (retail)       [address]       [place], [place]  [address]       Ph: [phone]       Fax: [phone]   RxID:   [id number]

## 2011-01-10 NOTE — Discharge Summary (Signed)
Summary: Hospital Discharge Update    Hospital Discharge Update:  Date of Admission: 12/25/2010 Date of Discharge: 12/27/2010  Brief Summary:  Nicole Mccormick presented on 12/25/10 as a direct transfer from Dr. Ninetta Lights due to eleveated blood pressures in the 230s/110s.  She was found to be symptomatic with headche, some vision changes, chest pain described as throbbing and stabbing, and parasthesias of the left arms.  Some wheezes were present in bilateral lung bases.  Systolic BP of the right arm was foiund to be 40 mmHg higher than the left arm.  Serial cardiac enzymes were negative, CT angio ruled out dissection, and CXR/WBC count ruled out bronchitis.  Pateint received Hydralazine 10mg  IV x1 and had Lisinopril 20mg  daily added to her Procardia 90mg  daily.  Through the course of her admsission her blood pressures steadily decreased and ultimately stabilized in the 130s/70s.  After her pressures had decreased, the pateint no longer complained of symptoms.  In addition to managing her hypertension, we added Flovent due to her twice daily usage of her abluterol rescue inhaler, increased Gabapentin to 300mg  by mouth two times a day to help with her discomfort from restless legs syndrome, added oral ferrous sulfate due to the iron deficiency anemia discovered on admission labs, and changed her HIV regimen to Truvada, Ritonavir, and Darunavir per Dr. Moshe Cipro instructions.  Labs needed at follow-up: CBC with differential, Basic metabolic panel  Other follow-up issues:  Patient will need to be followed by PCP to titrate ferrous sulfate dose for iron deficiency anemia and to monitor control of hypertension with Lisinopril and Procardia.  Medication list changes:  Removed medication of COMBIVIR 150-300 MG TABS (LAMIVUDINE-ZIDOVUDINE) Take 1 tablet by mouth two times a day Removed medication of CRIXIVAN 400 MG CAPS (INDINAVIR SULFATE) Take 2 capsules by mouth two times a day Added new medication of LISINOPRIL  20 MG TABS (LISINOPRIL) Take one tablet by mouth daily - Signed Added new medication of TRUVADA 200-300 MG TABS (EMTRICITABINE-TENOFOVIR) Take 1 tablet by mouth once a day - Signed Added new medication of NORVIR 100 MG CAPS (RITONAVIR) Take 1 capsule by mouth once a day - Signed Added new medication of PREZISTA 400 MG TABS (DARUNAVIR ETHANOLATE) Take 2 tablets by mouth once daily - Signed Changed medication from NEURONTIN 300 MG  CAPS (GABAPENTIN) Take 1 tablet by mouth once a day for 1 day then Take 1 tablet by mouth two times a day to NEURONTIN 300 MG  CAPS (GABAPENTIN) Take 1 tablet by mouth two times per day. - Signed Added new medication of FERROUS SULFATE 325 (65 FE) MG TABS (FERROUS SULFATE) Take 1 tab by mouth each day - Signed Added new medication of FLOVENT HFA 110 MCG/ACT AERO (FLUTICASONE PROPIONATE  HFA) 1 Puff twice daily. - Signed Rx of LISINOPRIL 20 MG TABS (LISINOPRIL) Take one tablet by mouth daily;  #30 x 2;  Signed;  Entered by: Leodis Sias MD;  Authorized by: Leodis Sias MD;  Method used: Electronically to [facility] [address] [phone]*, [address], [place], [place]  [address], Ph: [phone], Fax: [phone] Rx of TRUVADA 200-300 MG TABS (EMTRICITABINE-TENOFOVIR) Take 1 tablet by mouth once a day;  #30 x 2;  Signed;  Entered by: Leodis Sias MD;  Authorized by: Leodis Sias MD;  Method used: Electronically to [facility] [address] [phone]*, [address], [place], [place]  [address], Ph: [phone], Fax: [phone] Rx of NORVIR 100 MG CAPS (RITONAVIR) Take 1 capsule by mouth once a day;  #  30 x 2;  Signed;  Entered by: Leodis Sias MD;  Authorized by: Leodis Sias MD;  Method used: Electronically to [facility] [address] [phone]*, [address] [place], [place]  [address], Ph: [phone], Fax: [phone] Rx of PREZISTA 400 MG TABS (DARUNAVIR ETHANOLATE) Take 2 tablets by mouth once daily;  #60 x 2;  Signed;  Entered by: Leodis Sias MD;   Authorized by: Leodis Sias MD;  Method used: Electronically to [facility] [address] [phone]*, [address] [place], [place]  [address], Ph: [phone], Fax: [phone] Rx of NEURONTIN 300 MG  CAPS (GABAPENTIN) Take 1 tablet by mouth two times per day.;  #60 x 2;  Signed;  Entered by: Leodis Sias MD;  Authorized by: Leodis Sias MD;  Method used: Electronically to [facility] [address] [phone]*, [address], [place], [place]  [address], Ph: [phone], Fax: [phone] Rx of FERROUS SULFATE 325 (65 FE) MG TABS (FERROUS SULFATE) Take 1 tab by mouth each day;  #1 x 1;  Signed;  Entered by: Leodis Sias MD;  Authorized by: Leodis Sias MD;  Method used: Electronically to [facility] [address] [phone]*, [address] [place], [place]  [address], Ph: [phone], Fax: [phone] Rx of FLOVENT HFA 110 MCG/ACT AERO (FLUTICASONE PROPIONATE  HFA) 1 Puff twice daily.;  #1 x 2;  Signed;  Entered by: Leodis Sias MD;  Authorized by: Leodis Sias MD;  Method used: Electronically to [facility] [address] [phone]*, [address] [place], [place]  [address], Ph: [phone], Fax: [phone]  The medication, problem, and allergy lists have been updated.  Please see the dictated discharge summary for details.  Discharge medications:  PROCARDIA XL 90 MG TB24 (NIFEDIPINE) Take 1 tablet by mouth once a day FLEXERIL 10 MG  TABS (CYCLOBENZAPRINE HCL) as needed two times a day ENSURE   LIQD (NUTRITIONAL SUPPLEMENTS) one three times dail NEURONTIN 300 MG  CAPS (GABAPENTIN) Take 1 tablet by mouth two times per day. ALPRAZOLAM 0.5 MG TABS (ALPRAZOLAM) one evry 8 hours as needed. ACIPHEX 20 MG TBEC (RABEPRAZOLE SODIUM) Take 1 tablet by mouth once a day LISINOPRIL 20 MG TABS (LISINOPRIL) Take one tablet by mouth daily TRUVADA 200-300 MG TABS (EMTRICITABINE-TENOFOVIR) Take 1 tablet by mouth once a day NORVIR 100 MG CAPS (RITONAVIR) Take 1 capsule by mouth once a day PREZISTA 400 MG  TABS (DARUNAVIR ETHANOLATE) Take 2 tablets by mouth once daily FERROUS SULFATE 325 (65 FE) MG TABS (FERROUS SULFATE) Take 1 tab by mouth each day FLOVENT HFA 110 MCG/ACT AERO (FLUTICASONE PROPIONATE  HFA) 1 Puff twice daily.  Other patient instructions:  Start FLOVENT 1 puff twice daily for your breathing Start LISINOPRIL 20 mg tablet daily for your blood pressure Stop COMBIVIR Stop INDINAVIR Start TRUVADA one pill daily Start NORVIR 100 mg tablet daily Start PREVISTA 400 mg tablets take 2 daily  Continue all your other medications.  Patient is to follow up with Dr. Ninetta Lights on January 25,2012 at 10:30am for a hospital follow up.     Note: Hospital Discharge Medications & Other Instructions handout was printed, one copy for patient and a second copy to be placed in hospital chart.

## 2011-01-10 NOTE — Assessment & Plan Note (Signed)
Summary: f/u /dde   CC:  c/o high blood pressure, sob, and left side pain.  History of Present Illness: 65 yo F with hx of HIV+, HTN and GERD.  Last CD4 490 and VL 762  (Jan-2012). Concerned today about elavated BP readings. Is off HCTZ. Having DOE, pain in her L arm. has chest pain (throbbing, occas sharp stabbing, lasts for variable amt of time, has arm pain with it, no nausea), slight headaches (frontal extends to posterior, vision changes with headaches).  Has cut down on her smoking.   Preventive Screening-Counseling & Management  Alcohol-Tobacco     Alcohol drinks/day: <1     Alcohol type: wine     Smoking Status: current     Packs/Day: 0.5     Year Started: 1960   Updated Prior Medication List: COMBIVIR 150-300 MG TABS (LAMIVUDINE-ZIDOVUDINE) Take 1 tablet by mouth two times a day CRIXIVAN 400 MG CAPS (INDINAVIR SULFATE) Take 2 capsules by mouth two times a day PROCARDIA XL 90 MG TB24 (NIFEDIPINE) Take 1 tablet by mouth once a day FLEXERIL 10 MG  TABS (CYCLOBENZAPRINE HCL) as needed two times a day ENSURE   LIQD (NUTRITIONAL SUPPLEMENTS) one three times dail NEURONTIN 300 MG  CAPS (GABAPENTIN) Take 1 tablet by mouth once a day for 1 day then Take 1 tablet by mouth two times a day ALPRAZOLAM 0.5 MG TABS (ALPRAZOLAM) one evry 8 hours as needed. ACIPHEX 20 MG TBEC (RABEPRAZOLE SODIUM) Take 1 tablet by mouth once a day  Current Allergies (reviewed today): ! PENICILLIN Additional History Menstrual Status:  postmenopausal  Vital Signs:  Patient profile:   65 year old female Menstrual status:  postmenopausal Height:      63 inches (160.02 cm) Weight:      141 pounds (64.09 kg) BMI:     25.07 O2 Sat:      100 % on Room air Pulse rate:   62 / minute BP sitting:   232 / 111  (left arm)  Vitals Entered By: Starleen Arms CMA (December 25, 2010 9:31 AM)  O2 Flow:  Room air CC: c/o high blood pressure, sob, left side pain Is Patient Diabetic? No Pain Assessment Patient  in pain? yes     Location: left side Intensity: 8 Type: aching Nutritional Status BMI of 25 - 29 = overweight  Does patient need assistance? Functional Status Self care Ambulation Normal     Menstrual Status postmenopausal Last PAP Result Abnormal   Physical Exam  General:  well-developed, well-nourished, and well-hydrated.   Eyes:  pupils equal, pupils round, and pupils reactive to light.   Mouth:  pharynx pink and moist and no exudates.   Neck:  no masses.   Lungs:  normal respiratory effort and normal breath sounds.   Heart:  normal rate, regular rhythm, and no murmur.   Abdomen:  soft, non-tender, and normal bowel sounds.          Medication Adherence: 12/25/2010   Adherence to medications reviewed with patient. Counseling to provide adequate adherence provided   Prevention For Positives: 12/25/2010   Safe sex practices discussed with patient. Condoms offered.                             Impression & Recommendations:  Problem # 1:  HIV DISEASE (ICD-042) pt needs to switch rx's to ATVr/TRV or other simpler, once daily regiemen. will facilitate this once she is out of hospital .  Problem # 2:  HYPERTENSION, SEVERE (ICD-401.0) she is being admitted to a telemetry bed. i am concerned about her Chest pain and her associated symptoms. Explained to her that even restarting her HCTZ at this point would not lower her BP to a reasonable level.  The following medications were removed from the medication list:    Hydrochlorothiazide 12.5 Mg Tabs (Hydrochlorothiazide) .Marland Kitchen... Take 1 tablet by mouth once a day Her updated medication list for this problem includes:    Procardia Xl 90 Mg Tb24 (Nifedipine) .Marland Kitchen... Take 1 tablet by mouth once a day  Orders: 12 Lead EKG (12 Lead EKG)  Other Orders: T-Comprehensive Metabolic Panel ([id number]) T-CBC w/Diff ([id number]) T-Urinalysis ([id number]) Est. Patient Level IV (57846)  Appended Document: f/u /dde ECG- LVH, no  acute changes

## 2011-01-14 NOTE — Progress Notes (Signed)
Summary: referral response, rheumatology  Phone Note From Other Clinic   Caller: Referral Coordinator Reason for Call: Schedule Patient Appt Summary of Call: Unable to schedule pt. with Rheumatologist, not taking fibromyalgia patients.  What would be the next step?  Thanks, Hanley Ben, RN Initial call taken by: Jennet Maduro RN,  February 12, 2007 5:11 PM  Follow-up for Phone Call        thanks

## 2011-02-05 NOTE — Miscellaneous (Signed)
Summary: [facility]  [facility]   Imported By: Florinda Marker 02/01/2011 16:51:37  _____________________________________________________________________  External Attachment:    Type:   Image     Comment:   External Document

## 2011-02-22 ENCOUNTER — Ambulatory Visit: Payer: Self-pay | Admitting: Infectious Diseases

## 2011-02-22 LAB — T-HELPER CELL (CD4) - (RCID CLINIC ONLY)
CD4 % Helper T Cell: 13 % — ABNORMAL LOW (ref 33–55)
CD4 T Cell Abs: 560 uL (ref 400–2700)

## 2011-02-24 LAB — T-HELPER CELL (CD4) - (RCID CLINIC ONLY)
CD4 % Helper T Cell: 12 % — ABNORMAL LOW (ref 33–55)
CD4 T Cell Abs: 500 uL (ref 400–2700)

## 2011-02-25 ENCOUNTER — Ambulatory Visit: Payer: Self-pay | Admitting: Infectious Diseases

## 2011-03-05 ENCOUNTER — Encounter: Payer: Self-pay | Admitting: Infectious Diseases

## 2011-03-05 ENCOUNTER — Ambulatory Visit (INDEPENDENT_AMBULATORY_CARE_PROVIDER_SITE_OTHER): Payer: BC Managed Care – HMO | Admitting: Infectious Diseases

## 2011-03-05 ENCOUNTER — Other Ambulatory Visit: Payer: Self-pay | Admitting: Infectious Diseases

## 2011-03-05 DIAGNOSIS — B20 Human immunodeficiency virus [HIV] disease: Secondary | ICD-10-CM

## 2011-03-05 DIAGNOSIS — Z139 Encounter for screening, unspecified: Secondary | ICD-10-CM

## 2011-03-05 DIAGNOSIS — I10 Essential (primary) hypertension: Secondary | ICD-10-CM

## 2011-03-05 DIAGNOSIS — Z1231 Encounter for screening mammogram for malignant neoplasm of breast: Secondary | ICD-10-CM

## 2011-03-05 LAB — CBC
HCT: 41.2 % (ref 36.0–46.0)
Hemoglobin: 13.2 g/dL (ref 12.0–15.0)
MCH: 28.4 pg (ref 26.0–34.0)
MCHC: 32 g/dL (ref 30.0–36.0)
MCV: 88.8 fL (ref 78.0–100.0)
Platelets: 263 10*3/uL (ref 150–400)
RBC: 4.64 MIL/uL (ref 3.87–5.11)
RDW: 20.1 % — ABNORMAL HIGH (ref 11.5–15.5)
WBC: 7.4 10*3/uL (ref 4.0–10.5)

## 2011-03-05 LAB — COMPREHENSIVE METABOLIC PANEL
ALT: 60 U/L — ABNORMAL HIGH (ref 0–35)
AST: 47 U/L — ABNORMAL HIGH (ref 0–37)
Albumin: 3.8 g/dL (ref 3.5–5.2)
Alkaline Phosphatase: 102 U/L (ref 39–117)
BUN: 23 mg/dL (ref 6–23)
CO2: 25 mEq/L (ref 19–32)
Calcium: 8.9 mg/dL (ref 8.4–10.5)
Chloride: 102 mEq/L (ref 96–112)
Creat: 0.9 mg/dL (ref 0.40–1.20)
Glucose, Bld: 105 mg/dL — ABNORMAL HIGH (ref 70–99)
Potassium: 4.1 mEq/L (ref 3.5–5.3)
Sodium: 137 mEq/L (ref 135–145)
Total Bilirubin: 0.3 mg/dL (ref 0.3–1.2)
Total Protein: 8.3 g/dL (ref 6.0–8.3)

## 2011-03-05 LAB — RPR

## 2011-03-05 LAB — LIPID PANEL
Cholesterol: 131 mg/dL (ref 0–200)
HDL: 42 mg/dL (ref >39)
LDL Cholesterol: 69 mg/dL (ref 0–99)
Total CHOL/HDL Ratio: 3.1 Ratio
Triglycerides: 98 mg/dL (ref <150)
VLDL: 20 mg/dL (ref 0–40)

## 2011-03-05 NOTE — Assessment & Plan Note (Signed)
She attributes her BP to stress but she is also out of her procardia. She is asx. So will ask her to refill her procardia ASAP (today!!) and then f/u her BP. Sheagrees/ knows to call is she has any sx.

## 2011-03-05 NOTE — Progress Notes (Signed)
  Subjective:    Patient ID: Nicole Mccormick, female    DOB: [birth year], 65 y.o.   MRN: [id number]  HPI 65 yo F with hx of HIV/AIDS who is under stress- her sister has ovarian CA and is in the process of dying. Her mother also needs significant help with her health. She would like a home health aide. Would also like to see the dentist.    Review of Systems  Constitutional: Negative for unexpected weight change.  Respiratory: Negative for chest tightness and shortness of breath.   Neurological: Negative for dizziness and headaches.       Objective:   Physical Exam  Constitutional: She appears well-developed.  HENT:  Head: Normocephalic and atraumatic.  Eyes: Conjunctivae are normal. Pupils are equal, round, and reactive to light.  Neck: Normal range of motion. Neck supple.  Cardiovascular: Normal rate, regular rhythm and normal heart sounds.   Pulmonary/Chest: Effort normal and breath sounds normal.  Abdominal: Soft. Bowel sounds are normal.          Assessment & Plan:

## 2011-03-05 NOTE — Progress Notes (Signed)
Addended by: Mariea Clonts on: 03/05/2011 05:21 PM   Modules accepted: Orders

## 2011-03-05 NOTE — Progress Notes (Signed)
Addended by: Wendall Mola on: 03/05/2011 05:09 PM   Modules accepted: Orders

## 2011-03-05 NOTE — Progress Notes (Signed)
Addended by: Wendall Mola on: 03/05/2011 04:50 PM   Modules accepted: Orders

## 2011-03-05 NOTE — Assessment & Plan Note (Addendum)
She is doing well. She is given condoms. Will get her into the dental clinic as well as GYN and Mammo. Check her labs today. See her back in 3-4 months.

## 2011-03-06 LAB — HIV-1 RNA ULTRAQUANT REFLEX TO GENTYP+
HIV 1 RNA Quant: <20 copies/mL (ref <20)
HIV-1 RNA Quant, Log: <1.3 {Log} (ref <1.30)

## 2011-03-06 NOTE — Progress Notes (Signed)
Addended by: Wendall Mola on: 03/06/2011 09:59 AM   Modules accepted: Orders

## 2011-03-07 LAB — T-HELPER CELL (CD4) - (RCID CLINIC ONLY)
CD4 % Helper T Cell: 15 % — ABNORMAL LOW (ref 33–55)
CD4 T Cell Abs: 590 uL (ref 400–2700)

## 2011-03-08 ENCOUNTER — Ambulatory Visit: Payer: BC Managed Care – HMO

## 2011-03-14 LAB — T-HELPER CELL (CD4) - (RCID CLINIC ONLY)
CD4 % Helper T Cell: 15 % — ABNORMAL LOW (ref 33–55)
CD4 T Cell Abs: 480 uL (ref 400–2700)

## 2011-03-18 LAB — T-HELPER CELL (CD4) - (RCID CLINIC ONLY)
CD4 % Helper T Cell: 15 % — ABNORMAL LOW (ref 33–55)
CD4 T Cell Abs: 460 uL (ref 400–2700)

## 2011-04-12 ENCOUNTER — Other Ambulatory Visit: Payer: Self-pay | Admitting: *Deleted

## 2011-04-12 DIAGNOSIS — I10 Essential (primary) hypertension: Secondary | ICD-10-CM

## 2011-04-12 DIAGNOSIS — B20 Human immunodeficiency virus [HIV] disease: Secondary | ICD-10-CM

## 2011-04-16 NOTE — Telephone Encounter (Signed)
Message faxed to pharmacy.

## 2011-05-02 ENCOUNTER — Telehealth: Payer: Self-pay | Admitting: *Deleted

## 2011-05-02 ENCOUNTER — Inpatient Hospital Stay (INDEPENDENT_AMBULATORY_CARE_PROVIDER_SITE_OTHER)
Admission: RE | Admit: 2011-05-02 | Discharge: 2011-05-02 | Disposition: A | Discharge status: Home / Self Care | Payer: BC Managed Care – HMO | Source: Ambulatory Visit | Attending: Emergency Medicine | Admitting: Emergency Medicine

## 2011-05-02 DIAGNOSIS — M79609 Pain in unspecified limb: Secondary | ICD-10-CM

## 2011-05-02 NOTE — Telephone Encounter (Signed)
States she has pain in l calf when walking. She thinks it is a blood clot. States she has a hx of this. Going on a 10 hour bus trip nect week. Put her thru to front desk to make an appt. We have no appts today or tomorrow. Sent her to UC. She agreed with this

## 2011-05-08 ENCOUNTER — Other Ambulatory Visit: Payer: Self-pay | Admitting: Infectious Diseases

## 2011-05-08 DIAGNOSIS — G2581 Restless legs syndrome: Secondary | ICD-10-CM

## 2011-05-13 ENCOUNTER — Other Ambulatory Visit: Payer: Self-pay | Admitting: *Deleted

## 2011-05-13 DIAGNOSIS — E611 Iron deficiency: Secondary | ICD-10-CM

## 2011-05-13 DIAGNOSIS — B20 Human immunodeficiency virus [HIV] disease: Secondary | ICD-10-CM

## 2011-05-13 DIAGNOSIS — I10 Essential (primary) hypertension: Secondary | ICD-10-CM

## 2011-05-13 MED ORDER — LISINOPRIL 20 MG PO TABS: 20.0000 mg | ORAL_TABLET | Freq: Every day | ORAL | Status: DC

## 2011-05-13 MED ORDER — FERROUS SULFATE 325 (65 FE) MG PO TABS: 325.0000 mg | ORAL_TABLET | Freq: Every day | ORAL | Status: DC

## 2011-06-28 LAB — PROTIME-INR

## 2011-07-08 ENCOUNTER — Telehealth: Payer: Self-pay | Admitting: *Deleted

## 2011-07-08 NOTE — Telephone Encounter (Signed)
Pt left message that she was hospitalized in [place] for CHF.  Was advised to make a f/u visit once she returned to [facility].  Scheduled / Dr. Luciana Axe.   Dr. Ninetta Lights not available.  Jennet Maduro, RN

## 2011-07-19 ENCOUNTER — Ambulatory Visit (INDEPENDENT_AMBULATORY_CARE_PROVIDER_SITE_OTHER): Payer: BC Managed Care – HMO | Admitting: Internal Medicine

## 2011-07-19 ENCOUNTER — Telehealth: Payer: Self-pay | Admitting: *Deleted

## 2011-07-19 ENCOUNTER — Encounter: Payer: Self-pay | Admitting: Internal Medicine

## 2011-07-19 DIAGNOSIS — B20 Human immunodeficiency virus [HIV] disease: Secondary | ICD-10-CM

## 2011-07-19 DIAGNOSIS — I10 Essential (primary) hypertension: Secondary | ICD-10-CM

## 2011-07-19 MED ORDER — ATAZANAVIR SULFATE 200 MG PO CAPS: 400.0000 mg | ORAL_CAPSULE | Freq: Every day | ORAL | Status: DC

## 2011-07-19 MED ORDER — EMTRICITABINE-TENOFOVIR DF 200-300 MG PO TABS: 1.0000 | ORAL_TABLET | Freq: Every day | ORAL | Status: DC

## 2011-07-19 MED ORDER — DICLOFENAC SODIUM 50 MG PO TBEC: 50.0000 mg | DELAYED_RELEASE_TABLET | Freq: Two times a day (BID) | ORAL | Status: AC

## 2011-07-19 MED ORDER — RABEPRAZOLE SODIUM 20 MG PO TBEC: 20.0000 mg | DELAYED_RELEASE_TABLET | Freq: Every day | ORAL | Status: DC

## 2011-07-19 MED ORDER — AMLODIPINE BESYLATE 10 MG PO TABS: 10.0000 mg | ORAL_TABLET | Freq: Every day | ORAL | Status: DC

## 2011-07-19 NOTE — Telephone Encounter (Addendum)
Called patient to notify of Internal Medicine appointment with Dr. Clent Ridges at [place] for 07/24/11 at 12:00 pm Wendall Mola CMA

## 2011-07-19 NOTE — Assessment & Plan Note (Addendum)
This patient has significant CAD.  Her last LDL was 69 and she is on an aspirin and ACEI and CCB.  I have referred her to a PCP to assist in this management.  I discussed at length her need to completely quit smoking to preserve her remaining cardiac function.  I also discussed low salt due to her diastolic dysfunction.  I did increase her lisinopril to 20 mg daily.     For her HIV, I emphasized that she needs to always take her medicines unless specifically advised by a health care provider to stop.  She voiced her understanding and will restart her meds.  I did discuss condom use.

## 2011-07-19 NOTE — Progress Notes (Signed)
  Subjective:    Patient ID: Nicole Mccormick, female    DOB: 04-20-1946, 65 y.o.   MRN: [id number]  HPI she comes in after being in [place] and being hospitalized for hypertensive urgency and concern for MI.  After review of the hospital record, she had a myocardial perfusion scan which showed signifcant regional wall abnormalities consistent with previous MI and significant diastolic dysfunction.  She did not though require a cath and was medically managed.  Her HTn was difficult to control but she was discharged with nifedipine 10 mg and lisinopril 10 mg.  She also had reported leg pain and ABIs were done showing it was consitent with claudiction.  She is a smoker.  Unfortunately, she stopped her ART because she was concerned with medication interactions.     In reviewing her medications, she also has been taking darunavir, unboosted Reyataz and Truvada, though her records is to take the Reyataz and Truvada (she does not tolerate norvir), though it has been more than 2 weeks since she took those.  Today she has no chest pain and her weight is stable.  She does report some LE edema.     Review of Systems  Constitutional: Positive for activity change and fatigue. Negative for unexpected weight change.  HENT: Negative.   Eyes: Negative.   Respiratory: Negative.   Cardiovascular: Positive for leg swelling. Negative for chest pain and palpitations.  Gastrointestinal: Negative.   Genitourinary: Negative.   Musculoskeletal: Negative.   Skin: Negative.   Neurological: Negative.   Hematological: Negative.   Psychiatric/Behavioral: Negative.        Objective:   Physical Exam  Constitutional: She is oriented to person, place, and time. She appears well-developed and well-nourished.  HENT:  Mouth/Throat: No oropharyngeal exudate.  Cardiovascular: Normal rate, regular rhythm and normal heart sounds.   No murmur heard. Pulmonary/Chest: Effort normal and breath sounds normal. No respiratory distress.    Abdominal: Soft. Bowel sounds are normal. She exhibits no distension.  Lymphadenopathy:    She has no cervical adenopathy.  Neurological: She is alert and oriented to person, place, and time.  Skin: Skin is warm and dry. No erythema.  Psychiatric: She has a normal mood and affect. Her behavior is normal.          Assessment & Plan:

## 2011-07-26 ENCOUNTER — Telehealth: Payer: Self-pay | Admitting: *Deleted

## 2011-07-26 NOTE — Telephone Encounter (Signed)
Called to notify patient she has an appointment with [facility] for 08/28/11 at 8:30 AM Wendall Mola CMA

## 2011-07-30 ENCOUNTER — Telehealth: Payer: Self-pay | Admitting: *Deleted

## 2011-07-30 NOTE — Telephone Encounter (Signed)
She called concerned about the lump on her leg. States she is unable to wait until sept for her appt at internal medicine. Told her she can call & see if they can see her sooner. Since she has insurance, she could see any md. To let us know if she wants to go elsewhere & we can facilitate that. States she will call IM

## 2011-08-03 ENCOUNTER — Telehealth: Payer: Self-pay | Admitting: Infectious Disease

## 2011-08-03 DIAGNOSIS — I10 Essential (primary) hypertension: Secondary | ICD-10-CM

## 2011-08-03 MED ORDER — AMLODIPINE BESYLATE 10 MG PO TABS: 10.0000 mg | ORAL_TABLET | Freq: Every day | ORAL | Status: DC

## 2011-08-03 NOTE — Telephone Encounter (Signed)
Pt called for amlodipine rx, rx in [place]. WIll fill

## 2011-08-07 ENCOUNTER — Encounter: Payer: Self-pay | Admitting: Internal Medicine

## 2011-08-07 ENCOUNTER — Ambulatory Visit: Payer: BC Managed Care – HMO | Admitting: Licensed Clinical Social Worker

## 2011-08-07 ENCOUNTER — Ambulatory Visit (INDEPENDENT_AMBULATORY_CARE_PROVIDER_SITE_OTHER): Payer: BC Managed Care – HMO | Admitting: Internal Medicine

## 2011-08-07 VITALS — BP 177/80 | HR 59 | Temp 97.9°F | Ht 62.0 in | Wt 139.5 lb

## 2011-08-07 DIAGNOSIS — B20 Human immunodeficiency virus [HIV] disease: Secondary | ICD-10-CM

## 2011-08-07 DIAGNOSIS — Z720 Tobacco use: Secondary | ICD-10-CM

## 2011-08-07 DIAGNOSIS — I2510 Atherosclerotic heart disease of native coronary artery without angina pectoris: Secondary | ICD-10-CM | POA: Insufficient documentation

## 2011-08-07 DIAGNOSIS — Z1239 Encounter for other screening for malignant neoplasm of breast: Secondary | ICD-10-CM

## 2011-08-07 DIAGNOSIS — F17200 Nicotine dependence, unspecified, uncomplicated: Secondary | ICD-10-CM

## 2011-08-07 DIAGNOSIS — I10 Essential (primary) hypertension: Secondary | ICD-10-CM

## 2011-08-07 DIAGNOSIS — K219 Gastro-esophageal reflux disease without esophagitis: Secondary | ICD-10-CM

## 2011-08-07 MED ORDER — LISINOPRIL-HYDROCHLOROTHIAZIDE 20-25 MG PO TABS: 1.0000 | ORAL_TABLET | Freq: Every day | ORAL | Status: DC

## 2011-08-07 MED ORDER — NICOTINE 21 MG/24HR TD PT24: MEDICATED_PATCH | TRANSDERMAL | Status: DC

## 2011-08-07 MED ORDER — ALBUTEROL SULFATE HFA 108 (90 BASE) MCG/ACT IN AERS: 2.0000 | INHALATION_SPRAY | Freq: Four times a day (QID) | RESPIRATORY_TRACT | Status: DC | PRN

## 2011-08-07 NOTE — Progress Notes (Signed)
Ms. Dickenson' history and physical examination were reviewed with Dr. Dorise Hiss and the assessment and plan were formulated together.  I agree with the above documentation.

## 2011-08-07 NOTE — Progress Notes (Signed)
Referred by Physician for smoking cessation counseling.  Very pleasant lady with multiple health issues including HIV looking to quit smoking.  Has been smoking for 50 years and smokes about 1 pack per day.  She has tried to quit cold [place] over the years with little success.   She is concerned about her heart and vascular health and thinks it is time to quit smoking.   Patient has multiple health insurances including BCBS, [place] and Medicare. Uses BCBS for her medications.   Patient lives with friend who is nonsmoker.  Her home is smoke free and she does not own a car.  Her daughter is also a non smoker and she is not surrounded by smokers.   Many tips were shared with the patient and she is receptive to the information.  Increased success rates were discussed in terms of using medication vs. Cold [place].  Patient was also encouraged to either sign up for the Quitline or attend [facility] group program. She would like to try the patches but said that cost is a barrier.  We talked about the cost of her cigarettes vs. Buying the patches and she thinks it may be worth the sacrifice to get onto the patches.  She does not think BCBS will pay for patches however.  Explained how to best use the patches and dosing.    Tip sheets and handouts given.  Schedule for upcoming [facility] smoking cessation group given.  Patient very receptive to the information given and would like to quit in the next few weeks.  Patient encouraged to call SW with any further questions or problems.

## 2011-08-07 NOTE — Progress Notes (Signed)
Subjective:    Patient ID: Nicole Mccormick, female    DOB: 1946-07-14, 65 y.o.   MRN: [id number]  HPI: The patient is a 65 year old female comes [facility] of her hypertension. She does also have hepatitis C and HIV. She is currently a smoker about a half pack per day and has been for some time. She has been trying to quit in the past. She would like to quit now. She was recently hospitalized in [place] for some chest pains and there was evidence of heart attack at that time. We did obtain the records from that visit and I did review them personally. After that time she did go off her anti-retroviral therapy as she was not sure that it would be okay with the medications I prescribed her. She did see infectious disease people on August 10. I did advise her to restart taking her antiretrovirals. She is taking her blood pressure medications, Norvasc 10 mg and lisinopril 20 mg daily. She states that she has been taking her antiretrovirals as ordered. She states that she takes Flovent when she is having a hard time breathing, and does not have an albuterol inhaler. She does have a remote history of asthma. She states that she does not have flares that often when she is having shortness of breath and releases the Flovent. I did instruct her that I will give her an albuterol inhaler at today's visit which she is to take one puff of which is having a hard time breathing and then repeat one puff in 5 minutes if she still having a hard time breathing. I told her that we will dilate, she is using at next visit and if it seems that she is using it a lot we may need to add a controller medication. I did discuss with her the purpose of controller medications versus albuterol rescue inhaler. Other than that she is feeling well today. No chest pain, no shortness of breath, no fever, no chills, no change in bowel habits. She states that she has pain when she  walks. It is relieved with rest. She was evaluated in [place] she was told it was from claudication from blood vessels. This is one reason she wishes to stop smoking is that they will help her leg pain to improve.  Allergies: Penicillin  Past medical history: HIV (she does follow Dr. Ninetta Lights at infectious disease, she has had HIV for some time, she did just recently get HIV viral load and CD4 count on August 10th, she will return to see Dr. Ninetta Lights this Friday) Hepatitis C Hypertension Restless leg syndrome Tobacco abuse (she has been trying to quit, I did have her speak with Dorothe Pea today) GERD (well controlled with AcipHex) Cervical dysplasia (she follows with a [facility])  Social history: Patient is a smoker, occasional alcohol use, no drug use. She does not work currently and uses her daughter to take her to appointments.   Review of Systems  Constitutional: Positive for diaphoresis. Negative for fever, chills, activity change, appetite change, fatigue and unexpected weight change.  HENT: Negative.   Eyes: Negative.   Respiratory: Negative.  Negative for cough, choking, chest tightness, shortness of breath and stridor.   Cardiovascular: Negative.  Negative for chest pain, palpitations and leg swelling.  Gastrointestinal: Negative for nausea, vomiting, abdominal pain, diarrhea, constipation and abdominal distention.  Musculoskeletal: Positive for myalgias.       Patient does get  leg pain from claudication when she walks. Relieved with rest.  Skin: Negative.   Neurological: Negative.   Hematological: Negative.   Psychiatric/Behavioral: Negative.     Vitals: BP: 177/80 Pulse: 59 Temperature: 97.40F Oxygen saturation 99% on room air Height 5 feet 2 inches Weight 139 pounds     Objective:   Physical Exam  Constitutional: She is oriented to person, place, and time. She appears well-developed and well-nourished.  HENT:  Head: Normocephalic and atraumatic.   Eyes: EOM are normal. Pupils are equal, round, and reactive to light.  Neck: Normal range of motion. Neck supple. No JVD present. No tracheal deviation present. No thyromegaly present.  Cardiovascular: Normal rate, regular rhythm and normal heart sounds.   Pulmonary/Chest: Effort normal and breath sounds normal. No respiratory distress. She has no wheezes. She has no rales. She exhibits no tenderness.  Abdominal: Soft. Bowel sounds are normal. She exhibits no distension. There is no tenderness. There is no rebound and no guarding.  Musculoskeletal: Normal range of motion.  Lymphadenopathy:    She has no cervical adenopathy.  Neurological: She is alert and oriented to person, place, and time. No cranial nerve deficit.  Skin: Skin is warm and dry. No rash noted. No erythema. No pallor.  Psychiatric: She has a normal mood and affect. Her behavior is normal. Judgment and thought content normal.          Assessment & Plan:  1. Please see problem-oriented charting.  2. Disposition-I did fill out paperwork for FMLA for her daughter. I did discontinue her lisinopril 20 mg and start her on a combination pill lisinopril HCTZ 20 mg 25 mg. I did advise her to quit smoking. I did have her talk to Dorothe Pea about quitting, and prescribe her Nicotine patches to help her stop. I did prescribe her albuterol to be used for shortness of breath. We will reevaluate in one month whether she is using this frequently are not. Recheck her blood pressure in one month. Check  on smoking status in one month. She did not need any lab work at today's visit. I did order her mammogram at today's visit. We will re-discuss colonoscopy at future visit.

## 2011-08-07 NOTE — Patient Instructions (Signed)
You were seen today as a new patient. We are stopping your flovent. We will give you albuterol inhaler. Use this 1 puff when you get short of breath. If you are still short of breath 5 minutes later you can take a second puff. Use only if you are having problems breathing. We are giving you a nictotine patch to help you stop smoking. Please stop smoking! This is one of the best things you can do for your body and will help you with your leg pain. We are going to add another blood pressure medicine. Stop taking lisinopril. Start taking lisinopril/hctz (hydrochlorothiazide). This is a combination pill with two medicines in it. Keep taking norvasc (amlodipine) for your blood pressure as well. Come back in 1 month for a blood pressure check.   Smoking Cessation This document explains the best ways for you to quit smoking and new treatments to help. It lists new medicines that can double or triple your chances of quitting and quitting for good. It also considers ways to avoid relapses and concerns you may have about quitting, including weight gain. NICOTINE: A POWERFUL ADDICTION If you have tried to quit smoking, you know how hard it can be. It is hard because nicotine is a very addictive drug. For some people, it can be as addictive as heroin or cocaine. Usually, people make 2 or 3 tries, or more, before finally being able to quit. Each time you try to quit, you can learn about what helps and what hurts. Quitting takes hard work and a lot of effort, but you can quit smoking. QUITTING SMOKING IS ONE OF THE MOST IMPORTANT THINGS YOU WILL EVER DO:  You will live longer, feel better, and live better.   The impact on your body of quitting smoking is felt almost immediately:   Within 20 minutes, blood pressure decreases. Pulse returns to its normal level.   After 8 hours, carbon monoxide levels in the blood return to normal. Oxygen level increases.   After 24 hours, chance of heart attack starts to decrease.  Breath, hair, and body stop smelling like smoke.   After 48 hours, damaged nerve endings begin to recover. Sense of taste and smell improve.   After 72 hours, the body is virtually free of nicotine. Bronchial tubes relax and breathing becomes easier.   After 2 to 12 weeks, lungs can hold more air. Exercise becomes easier and circulation improves.   Quitting will lower your chance of having a heart attack, stroke, cancer, or lung disease:   After 1 year, the risk of coronary heart disease is cut in half.   After 5 years, the risk of stroke falls to the same as a nonsmoker.   After 10 years, the risk of lung cancer is cut in half and the risk of other cancers decreases significantly.   After 15 years, the risk of coronary heart disease drops, usually to the level of a nonsmoker.   If you are pregnant, quitting smoking will improve your chances of having a healthy baby.   The people you live with, especially your children, will be healthier.   You will have extra money to spend on things other than cigarettes.  FIVE KEYS TO QUITTING Studies have shown that these 5 steps will help you quit smoking and quit for good. You have the best chances of quitting if you use them together: 1. Get ready.  2. Get support and encouragement.  3. Learn new skills and behaviors.  4.  Get medicine to reduce your nicotine addiction and use it correctly.  5. Be prepared for relapse or difficult situations. Be determined to continue trying to quit, even if you do not succeed at first.  1. GET READY  Set a quit date.   Change your environment.   Get rid of ALL cigarettes, ashtrays, matches, and lighters in your home, car, and place of work.   Do not let people smoke in your home.   Review your past attempts to quit. Think about what worked and what did not.   Once you quit, do not smoke. NOT EVEN A PUFF!  2. GET SUPPORT AND ENCOURAGEMENT Studies have shown that you have a better chance of being  successful if you have help. You can get support in many ways.  Tell your family, friends, and coworkers that you are going to quit and need their support. Ask them not to smoke around you.   Talk to your caregivers (doctor, dentist, nurse, pharmacist, psychologist, and/or smoking counselor).   Get individual, group, or telephone counseling and support. The more counseling you have, the better your chances are of quitting. Programs are available at [employer] and health centers. Call your local health department for information about programs in your area.   Spiritual beliefs and practices may help some smokers quit.   Quit meters are Photographer that keep track of quit statistics, such as amount of "quit-time," cigarettes not smoked, and money saved.   Many smokers find one or more of the many self-help books available useful in helping them quit and stay off tobacco.  3. LEARN NEW SKILLS AND BEHAVIORS  Try to distract yourself from urges to smoke. Talk to someone, go for a walk, or occupy your time with a task.   When you first try to quit, change your routine. Take a different route to work. Drink tea instead of coffee. Eat breakfast in a different place.   Do something to reduce your stress. Take a hot bath, exercise, or read a book.   Plan something enjoyable to do every day. Reward yourself for not smoking.   Explore interactive web-based programs that specialize in helping you quit.  4. GET MEDICINE AND USE IT CORRECTLY Medicines can help you stop smoking and decrease the urge to smoke. Combining medicine with the above behavioral methods and support can quadruple your chances of successfully quitting smoking. The [place] Food and Drug Administration (FDA) has approved 7 medicines to help you quit smoking. These medicines fall into 3 categories.  Nicotine replacement therapy (delivers nicotine to your body without the negative effects and risks  of smoking):   Nicotine gum: Available over-the-counter.   Nicotine lozenges: Available over-the-counter.   Nicotine inhaler: Available by prescription.   Nicotine nasal spray: Available by prescription.   Nicotine skin patches (transdermal): Available by prescription and over-the-counter.   Antidepressant medicine (helps people abstain from smoking, but how this works is unknown):   Bupropion sustained-release (SR) tablets: Available by prescription.   Nicotinic receptor partial agonist (simulates the effect of nicotine in your brain):   Varenicline tartrate tablets: Available by prescription.   Ask your caregiver for advice about which medicines to use and how to use them. Carefully read the information on the package.   Everyone who is trying to quit may benefit from using a medicine. If you are pregnant or trying to become pregnant, nursing an infant, you are under age 64, or you smoke fewer  than 10 cigarettes per day, talk to your caregiver before taking any nicotine replacement medicines.   You should stop using a nicotine replacement product and call your caregiver if you experience nausea, dizziness, weakness, vomiting, fast or irregular heartbeat, mouth problems with the lozenge or gum, or redness or swelling of the skin around the patch that does not go away.   Do not use any other product containing nicotine while using a nicotine replacement product.   Talk to your caregiver before using these products if you have diabetes, heart disease, asthma, stomach ulcers, you had a recent heart attack, you have high blood pressure that is not controlled with medicine, a history of irregular heartbeat, or you have been prescribed medicine to help you quit smoking.  5. BE PREPARED FOR RELAPSE OR DIFFICULT SITUATIONS  Most relapses occur within the first 3 months after quitting. Do not be discouraged if you start smoking again. Remember, most people try several times before they finally  quit.   You may have symptoms of withdrawal because your body is used to nicotine. You may crave cigarettes, be irritable, feel very hungry, cough often, get headaches, or have difficulty concentrating.   The withdrawal symptoms are only temporary. They are strongest when you first quit, but they will go away within 10 to 14 days.  Here are some difficult situations to watch for:  Alcohol. Avoid drinking alcohol. Drinking lowers your chances of successfully quitting.   Caffeine. Try to reduce the amount of caffeine you consume. It also lowers your chances of successfully quitting.   Other smokers. Being around smoking can make you want to smoke. Avoid smokers.   Weight gain. Many smokers will gain weight when they quit, usually less than 10 pounds. Eat a healthy diet and stay active. Do not let weight gain distract you from your main goal, quitting smoking. Some medicines that help you quit smoking may also help delay weight gain. You can always lose the weight gained after you quit.   Bad mood or depression. There are a lot of ways to improve your mood other than smoking.  If you are having problems with any of these situations, talk to your caregiver. SPECIAL SITUATIONS OR CONDITIONS Studies suggest that everyone can quit smoking. Your situation or condition can give you a special reason to quit.  Pregnant women/New mothers: By quitting, you protect your baby's health and your own.   Hospitalized patients: By quitting, you reduce health problems and help healing.   Heart attack patients: By quitting, you reduce your risk of a second heart attack.   Lung, head, and neck cancer patients: By quitting, you reduce your chance of a second cancer.   Parents of children and adolescents: By quitting, you protect your children from illnesses caused by secondhand smoke.  QUESTIONS TO THINK ABOUT Think about the following questions before you try to stop smoking. You may want to talk about your  answers with your caregiver.  Why do you want to quit?   If you tried to quit in the past, what helped and what did not?   What will be the most difficult situations for you after you quit? How will you plan to handle them?   Who can help you through the tough times? Your family? Friends? Caregiver?   What pleasures do you get from smoking? What ways can you still get pleasure if you quit?  Here are some questions to ask your caregiver:  How can you help me  to be successful at quitting?   What medicine do you think would be best for me and how should I take it?   What should I do if I need more help?   What is smoking withdrawal like? How can I get information on withdrawal?  Quitting takes hard work and a lot of effort, but you can quit smoking. FOR MORE INFORMATION Smokefree.gov (http://www.davis-sullivan.com/) provides free, accurate, evidence-based information and professional assistance to help support the immediate and long-term needs of people trying to quit smoking. Document Released: 11/19/2001 Document Re-Released: 05/15/2010 [facility] Patient Information 2011 [place], [place].

## 2011-08-07 NOTE — Assessment & Plan Note (Signed)
Patient's blood pressure today 177/80. We will stop lisinopril 20 mg daily and start lisinopril/HCTZ 20 mg 25 mg. She is also taking Norvasc 10 mg daily. We will reassess in one month for level of control. We have advised her to stop smoking and given her nicotine patches today.

## 2011-08-07 NOTE — Assessment & Plan Note (Signed)
Patient is taking Reyataz and Truvada for her HIV. I did advise her to take these daily. I did advise the need for her to take Reyataz 12 hours apart from AcipHex. She is followed by Dr. Ninetta Lights at the ID clinic for her HIV. Most recent CD4 count greater than 500.

## 2011-08-07 NOTE — Assessment & Plan Note (Signed)
Patient was given prescription for nicotine patches today, hopefully she'll stop smoking. Please check on her smoking status in one month. Encourage her strongly to quit or to continue with her quitting. She does smoke approximately 1/2 pack per day. I have informed her that this is probably exacerbating her claudication symptoms.

## 2011-08-07 NOTE — Assessment & Plan Note (Signed)
Patient is taking a daily aspirin. We will work on getting her blood pressure and her better control. Her fasting lipid panel is good, LDL 63. I did advise her strongly to quit smoking. Did prescribe nicotine patches at today's visit.

## 2011-08-07 NOTE — Assessment & Plan Note (Signed)
Patient controlled on AcipHex, reiterated the need to take this 12 hours apart from Reyataz. Patient did verbalize understanding.

## 2011-08-09 ENCOUNTER — Encounter: Payer: Self-pay | Admitting: Infectious Diseases

## 2011-08-09 ENCOUNTER — Ambulatory Visit (INDEPENDENT_AMBULATORY_CARE_PROVIDER_SITE_OTHER): Payer: BC Managed Care – HMO | Admitting: Infectious Diseases

## 2011-08-09 VITALS — BP 162/90 | HR 69 | Temp 98.3°F | Ht 62.0 in | Wt 140.0 lb

## 2011-08-09 DIAGNOSIS — Z23 Encounter for immunization: Secondary | ICD-10-CM

## 2011-08-09 DIAGNOSIS — K029 Dental caries, unspecified: Secondary | ICD-10-CM

## 2011-08-09 DIAGNOSIS — F17200 Nicotine dependence, unspecified, uncomplicated: Secondary | ICD-10-CM

## 2011-08-09 DIAGNOSIS — B20 Human immunodeficiency virus [HIV] disease: Secondary | ICD-10-CM

## 2011-08-09 DIAGNOSIS — N879 Dysplasia of cervix uteri, unspecified: Secondary | ICD-10-CM

## 2011-08-09 DIAGNOSIS — I10 Essential (primary) hypertension: Secondary | ICD-10-CM

## 2011-08-09 LAB — CBC WITH DIFFERENTIAL/PLATELET
Basophils Absolute: 0 10*3/uL (ref 0.0–0.1)
Basophils Relative: 1 % (ref 0–1)
Eosinophils Absolute: 0.1 10*3/uL (ref 0.0–0.7)
Eosinophils Relative: 2 % (ref 0–5)
HCT: 33.6 % — ABNORMAL LOW (ref 36.0–46.0)
Hemoglobin: 10.9 g/dL — ABNORMAL LOW (ref 12.0–15.0)
Lymphocytes Relative: 56 % — ABNORMAL HIGH (ref 12–46)
Lymphs Abs: 3 10*3/uL (ref 0.7–4.0)
MCH: 28.9 pg (ref 26.0–34.0)
MCHC: 32.4 g/dL (ref 30.0–36.0)
MCV: 89.1 fL (ref 78.0–100.0)
Monocytes Absolute: 0.5 10*3/uL (ref 0.1–1.0)
Monocytes Relative: 9 % (ref 3–12)
Neutro Abs: 1.8 10*3/uL (ref 1.7–7.7)
Neutrophils Relative %: 33 % — ABNORMAL LOW (ref 43–77)
Platelets: 347 10*3/uL (ref 150–400)
RBC: 3.77 MIL/uL — ABNORMAL LOW (ref 3.87–5.11)
RDW: 14.8 % (ref 11.5–15.5)
WBC: 5.4 10*3/uL (ref 4.0–10.5)

## 2011-08-09 LAB — COMPLETE METABOLIC PANEL WITH GFR
ALT: 24 U/L (ref 0–35)
AST: 29 U/L (ref 0–37)
Albumin: 4.1 g/dL (ref 3.5–5.2)
Alkaline Phosphatase: 107 U/L (ref 39–117)
BUN: 16 mg/dL (ref 6–23)
CO2: 27 mEq/L (ref 19–32)
Calcium: 9.1 mg/dL (ref 8.4–10.5)
Chloride: 102 mEq/L (ref 96–112)
Creat: 0.79 mg/dL (ref 0.50–1.10)
GFR, Est African American: >60 mL/min (ref >60)
GFR, Est Non African American: >60 mL/min (ref >60)
Glucose, Bld: 95 mg/dL (ref 70–99)
Potassium: 4.5 mEq/L (ref 3.5–5.3)
Sodium: 135 mEq/L (ref 135–145)
Total Bilirubin: 0.4 mg/dL (ref 0.3–1.2)
Total Protein: 8.5 g/dL — ABNORMAL HIGH (ref 6.0–8.3)

## 2011-08-09 LAB — T-HELPER CELL (CD4) - (RCID CLINIC ONLY)
CD4 % Helper T Cell: 13 % — ABNORMAL LOW (ref 33–55)
CD4 T Cell Abs: 380 uL — ABNORMAL LOW (ref 400–2700)

## 2011-08-09 NOTE — Assessment & Plan Note (Addendum)
My great appreciation to the [facility] for their f/u. She will see them in 1 month.

## 2011-08-09 NOTE — Progress Notes (Signed)
  Subjective:    Patient ID: Nicole Mccormick, female    DOB: 1946/05/08, 65 y.o.   MRN: [id number]  HPI 65 yo F with HIV, Hep C, HTN and hospitalization while she was in [place] for CP-  she had a myocardial perfusion scan which showed signifcant regional wall abnormalities consistent with previous MI and significant diastolic dysfunction. She did not require a cath and was medically managed.. She also had reported leg pain and ABIs were done showing it was consitent with claudiction. She stopped her ART because she was concerned with medication interactions until she was seen in ID f/u (of meds ~ 2 weeks). She has since quit smoking, using nicotine patches. No CP, no SOB, has some occas leg pain from her claudication.    Review of Systems  Constitutional: Negative for fever, chills and unexpected weight change.  Respiratory: Negative for shortness of breath.   Cardiovascular: Negative for chest pain.  Gastrointestinal: Negative for diarrhea and constipation.  Genitourinary: Negative for dysuria.  Neurological: Negative for headaches.  has occas twinge in her chest, not like previous CP.      Objective:   Physical Exam  Constitutional: She appears well-developed and well-nourished.  Eyes: EOM are normal. Pupils are equal, round, and reactive to light.  Neck: Neck supple. JVD present.  Cardiovascular: Normal rate, regular rhythm and normal heart sounds.   Pulmonary/Chest: Effort normal and breath sounds normal. She has no rales.  Abdominal: Soft. Bowel sounds are normal. She exhibits no distension. There is no tenderness.  Musculoskeletal: She exhibits no edema.  Lymphadenopathy:    She has no cervical adenopathy.          Assessment & Plan:

## 2011-08-09 NOTE — Assessment & Plan Note (Signed)
She is trying to quit, has realized her end organ damage.

## 2011-08-09 NOTE — Assessment & Plan Note (Signed)
She is doing very well. Will cont her on her current rxs. She gets flu shot today. Offered condoms. Will check her labs today and see her back in 3 months.

## 2011-08-09 NOTE — Assessment & Plan Note (Signed)
Will have her get repeat PAP and Mammo

## 2011-08-09 NOTE — Assessment & Plan Note (Signed)
Will have her seen in dental

## 2011-08-13 ENCOUNTER — Telehealth: Payer: Self-pay | Admitting: *Deleted

## 2011-08-13 LAB — HIV-1 RNA QUANT-NO REFLEX-BLD
HIV 1 RNA Quant: 76 copies/mL — ABNORMAL HIGH (ref <20)
HIV-1 RNA Quant, Log: 1.88 {Log} — ABNORMAL HIGH (ref <1.30)

## 2011-08-13 NOTE — Telephone Encounter (Signed)
Error

## 2011-08-14 ENCOUNTER — Other Ambulatory Visit: Payer: BC Managed Care – HMO

## 2011-08-16 ENCOUNTER — Other Ambulatory Visit: Payer: Self-pay | Admitting: *Deleted

## 2011-08-16 NOTE — Telephone Encounter (Signed)
RN called pharmacy.  Pt has refills left for Truvada.  RN attempted to return the pt's phone call at the number she left.  The phone has been disconnected.  Jennet Maduro, RN

## 2011-08-28 ENCOUNTER — Ambulatory Visit: Payer: BC Managed Care – HMO | Admitting: Infectious Diseases

## 2011-08-28 ENCOUNTER — Encounter: Payer: BC Managed Care – HMO | Admitting: Internal Medicine

## 2011-08-28 LAB — T-HELPER CELL (CD4) - (RCID CLINIC ONLY)
CD4 % Helper T Cell: 14 — ABNORMAL LOW
CD4 T Cell Abs: 520

## 2011-09-04 LAB — T-HELPER CELL (CD4) - (RCID CLINIC ONLY)
CD4 % Helper T Cell: 13 — ABNORMAL LOW
CD4 T Cell Abs: 500

## 2011-09-05 ENCOUNTER — Ambulatory Visit: Payer: BC Managed Care – HMO | Admitting: Internal Medicine

## 2011-09-09 LAB — T-HELPER CELL (CD4) - (RCID CLINIC ONLY)
CD4 % Helper T Cell: 17 — ABNORMAL LOW
CD4 T Cell Abs: 600

## 2011-09-10 ENCOUNTER — Telehealth: Payer: Self-pay | Admitting: *Deleted

## 2011-09-10 NOTE — Telephone Encounter (Signed)
Patient called to inquire about the dental clinic and where she is on the wait list. Made sure we have a good number for the patient [phone] and advised her that they are working through and that because she declined an appointment earlier she was moved to the bottom of the list but I would let them know she is having really bad mouth pain.

## 2011-09-17 LAB — DIFFERENTIAL
Basophils Absolute: 0
Basophils Relative: 0
Eosinophils Absolute: 0.1 — ABNORMAL LOW
Eosinophils Relative: 1
Lymphocytes Relative: 25
Lymphs Abs: 2.5
Monocytes Absolute: 0.6
Monocytes Relative: 6
Neutro Abs: 6.9
Neutrophils Relative %: 68

## 2011-09-17 LAB — BASIC METABOLIC PANEL
BUN: 7
CO2: 29
Calcium: 9.1
Chloride: 101
Creatinine, Ser: 0.57
GFR calc Af Amer: >60
GFR calc non Af Amer: >60
Glucose, Bld: 105 — ABNORMAL HIGH
Potassium: 3.6
Sodium: 135

## 2011-09-17 LAB — CBC
HCT: 39.8
Hemoglobin: 13.5
MCHC: 34
MCV: 112.1 — ABNORMAL HIGH
Platelets: 319
RBC: 3.55 — ABNORMAL LOW
RDW: 14.3
WBC: 10.1

## 2011-09-19 ENCOUNTER — Telehealth: Payer: Self-pay | Admitting: Licensed Clinical Social Worker

## 2011-09-19 NOTE — Telephone Encounter (Signed)
Faxed live in attendant verification form to [facility]. Att:  Era Skeen.    [phone]  Is phone # and fax is [phone].

## 2011-09-24 LAB — T-HELPER CELL (CD4) - (RCID CLINIC ONLY)
CD4 % Helper T Cell: 15 — ABNORMAL LOW
CD4 T Cell Abs: 420

## 2011-09-25 ENCOUNTER — Other Ambulatory Visit (HOSPITAL_COMMUNITY)
Admission: RE | Admit: 2011-09-25 | Discharge: 2011-09-25 | Disposition: A | Discharge status: Home / Self Care | Payer: Medicare Other | Source: Ambulatory Visit | Attending: Family Medicine | Admitting: Family Medicine

## 2011-09-25 ENCOUNTER — Ambulatory Visit (INDEPENDENT_AMBULATORY_CARE_PROVIDER_SITE_OTHER): Payer: Medicare Other | Admitting: Family Medicine

## 2011-09-25 ENCOUNTER — Encounter: Payer: Self-pay | Admitting: Family Medicine

## 2011-09-25 DIAGNOSIS — Z124 Encounter for screening for malignant neoplasm of cervix: Secondary | ICD-10-CM | POA: Insufficient documentation

## 2011-09-25 DIAGNOSIS — N879 Dysplasia of cervix uteri, unspecified: Secondary | ICD-10-CM

## 2011-09-25 DIAGNOSIS — Z0000 Encounter for general adult medical examination without abnormal findings: Secondary | ICD-10-CM

## 2011-09-25 DIAGNOSIS — R87810 Cervical high risk human papillomavirus (HPV) DNA test positive: Secondary | ICD-10-CM | POA: Insufficient documentation

## 2011-09-25 NOTE — Progress Notes (Signed)
  Subjective:    Patient ID: Nicole Mccormick, female    DOB: 1946/09/30, 65 y.o.   MRN: [id number]  HPI This is a 65 year old female who was referred to our clinic by Dr. Ninetta Lights of infectious disease for her history of abnormal Pap smears. The last Pap smear that the patient has had was in our clinic in 2009. The results of that Pap smear was ascus with HPV. She did have colposcopy which did not show any abnormal results. The patient currently denies sexual activity. She is postmenopausal. She does have a history of a hysterectomy, including the cervix and fallopian tubes. She denies vaginal bleeding vaginal discharge. The patient does perform self breast exams and does not feel any lumps.   Review of Systems  Constitutional: Negative for fever, chills, appetite change, fatigue and unexpected weight change.  Respiratory: Negative for cough, shortness of breath and wheezing.   Cardiovascular: Negative for chest pain, palpitations and leg swelling.  Gastrointestinal: Negative for nausea, abdominal pain, diarrhea and constipation.  Genitourinary: Negative for dysuria, urgency, hematuria, vaginal bleeding, vaginal discharge, genital sores, menstrual problem, pelvic pain and dyspareunia.       Objective:   Physical Exam  Constitutional: She appears well-developed and well-nourished.  HENT:  Head: Normocephalic and atraumatic.  Eyes: Pupils are equal, round, and reactive to light.  Neck: Normal range of motion. Neck supple.  Cardiovascular: Normal rate, regular rhythm and normal heart sounds.   Pulmonary/Chest: Effort normal and breath sounds normal. No respiratory distress. She has no wheezes. She has no rales. She exhibits no tenderness.  Abdominal: Soft. Bowel sounds are normal. She exhibits no distension and no mass. There is no tenderness. There is no rebound and no guarding.       Grade 3/6 systolic ejection murmur heard best in left upper sternal border  Genitourinary:       There is no  cervix. The external female genitalia are normal in appearance without lesions or ulcers. The vaginal walls appeared intact with good mucosa.      Assessment & Plan:  #1 history of abnormal Pap smear A Pap smear was obtained of the vaginal cuff in the office today which was sent for normal cytology as well as for her HPV typing. We will contact patient with results of this. #2 preventative health screening Will order the patient for screening mammogram.

## 2011-09-25 NOTE — Progress Notes (Signed)
Smoking cessation given to pt.  

## 2011-10-06 ENCOUNTER — Other Ambulatory Visit: Payer: Self-pay | Admitting: Infectious Diseases

## 2011-10-06 DIAGNOSIS — G2581 Restless legs syndrome: Secondary | ICD-10-CM

## 2011-10-12 ENCOUNTER — Inpatient Hospital Stay (HOSPITAL_COMMUNITY)
Admission: EM | Admit: 2011-10-12 | Discharge: 2011-10-14 | DRG: 811 | Disposition: A | Discharge status: Home / Self Care | Payer: Medicare Other | Attending: Infectious Diseases | Admitting: Infectious Diseases

## 2011-10-12 ENCOUNTER — Emergency Department (HOSPITAL_COMMUNITY): Payer: Medicare Other

## 2011-10-12 ENCOUNTER — Encounter: Payer: Self-pay | Admitting: Internal Medicine

## 2011-10-12 DIAGNOSIS — F17200 Nicotine dependence, unspecified, uncomplicated: Secondary | ICD-10-CM | POA: Diagnosis present

## 2011-10-12 DIAGNOSIS — B1920 Unspecified viral hepatitis C without hepatic coma: Secondary | ICD-10-CM | POA: Diagnosis present

## 2011-10-12 DIAGNOSIS — J189 Pneumonia, unspecified organism: Secondary | ICD-10-CM

## 2011-10-12 DIAGNOSIS — K219 Gastro-esophageal reflux disease without esophagitis: Secondary | ICD-10-CM | POA: Diagnosis present

## 2011-10-12 DIAGNOSIS — L2989 Other pruritus: Secondary | ICD-10-CM | POA: Diagnosis not present

## 2011-10-12 DIAGNOSIS — I369 Nonrheumatic tricuspid valve disorder, unspecified: Secondary | ICD-10-CM

## 2011-10-12 DIAGNOSIS — B20 Human immunodeficiency virus [HIV] disease: Secondary | ICD-10-CM | POA: Diagnosis present

## 2011-10-12 DIAGNOSIS — D126 Benign neoplasm of colon, unspecified: Secondary | ICD-10-CM | POA: Diagnosis present

## 2011-10-12 DIAGNOSIS — L298 Other pruritus: Secondary | ICD-10-CM | POA: Diagnosis not present

## 2011-10-12 DIAGNOSIS — G2581 Restless legs syndrome: Secondary | ICD-10-CM | POA: Diagnosis present

## 2011-10-12 DIAGNOSIS — Z7982 Long term (current) use of aspirin: Secondary | ICD-10-CM

## 2011-10-12 DIAGNOSIS — R195 Other fecal abnormalities: Secondary | ICD-10-CM

## 2011-10-12 DIAGNOSIS — I739 Peripheral vascular disease, unspecified: Secondary | ICD-10-CM | POA: Diagnosis present

## 2011-10-12 DIAGNOSIS — Z880 Allergy status to penicillin: Secondary | ICD-10-CM

## 2011-10-12 DIAGNOSIS — Z833 Family history of diabetes mellitus: Secondary | ICD-10-CM

## 2011-10-12 DIAGNOSIS — K449 Diaphragmatic hernia without obstruction or gangrene: Secondary | ICD-10-CM | POA: Diagnosis present

## 2011-10-12 DIAGNOSIS — D500 Iron deficiency anemia secondary to blood loss (chronic): Secondary | ICD-10-CM

## 2011-10-12 DIAGNOSIS — Y9210 Unspecified residential institution as the place of occurrence of the external cause: Secondary | ICD-10-CM | POA: Diagnosis not present

## 2011-10-12 DIAGNOSIS — D649 Anemia, unspecified: Secondary | ICD-10-CM

## 2011-10-12 DIAGNOSIS — I10 Essential (primary) hypertension: Secondary | ICD-10-CM | POA: Diagnosis present

## 2011-10-12 DIAGNOSIS — T368X5A Adverse effect of other systemic antibiotics, initial encounter: Secondary | ICD-10-CM | POA: Diagnosis not present

## 2011-10-12 DIAGNOSIS — Z8041 Family history of malignant neoplasm of ovary: Secondary | ICD-10-CM

## 2011-10-12 DIAGNOSIS — D509 Iron deficiency anemia, unspecified: Principal | ICD-10-CM | POA: Diagnosis present

## 2011-10-12 DIAGNOSIS — Z832 Family history of diseases of the blood and blood-forming organs and certain disorders involving the immune mechanism: Secondary | ICD-10-CM

## 2011-10-12 DIAGNOSIS — L270 Generalized skin eruption due to drugs and medicaments taken internally: Secondary | ICD-10-CM | POA: Diagnosis not present

## 2011-10-12 DIAGNOSIS — I252 Old myocardial infarction: Secondary | ICD-10-CM

## 2011-10-12 DIAGNOSIS — I2510 Atherosclerotic heart disease of native coronary artery without angina pectoris: Secondary | ICD-10-CM | POA: Diagnosis present

## 2011-10-12 DIAGNOSIS — Z21 Asymptomatic human immunodeficiency virus [HIV] infection status: Secondary | ICD-10-CM | POA: Diagnosis present

## 2011-10-12 HISTORY — DX: Gastro-esophageal reflux disease without esophagitis: K21.9

## 2011-10-12 HISTORY — DX: Iron deficiency anemia, unspecified: D50.9

## 2011-10-12 LAB — CBC
HCT: 17.8 % — ABNORMAL LOW (ref 36.0–46.0)
HCT: 25.1 % — ABNORMAL LOW (ref 36.0–46.0)
HCT: 26.3 % — ABNORMAL LOW (ref 36.0–46.0)
Hemoglobin: 5.1 g/dL — CL (ref 12.0–15.0)
Hemoglobin: 7.9 g/dL — ABNORMAL LOW (ref 12.0–15.0)
Hemoglobin: 8.4 g/dL — ABNORMAL LOW (ref 12.0–15.0)
MCH: 20.6 pg — ABNORMAL LOW (ref 26.0–34.0)
MCH: 23.4 pg — ABNORMAL LOW (ref 26.0–34.0)
MCH: 23.7 pg — ABNORMAL LOW (ref 26.0–34.0)
MCHC: 28.7 g/dL — ABNORMAL LOW (ref 30.0–36.0)
MCHC: 31.5 g/dL (ref 30.0–36.0)
MCHC: 31.9 g/dL (ref 30.0–36.0)
MCV: 72.1 fL — ABNORMAL LOW (ref 78.0–100.0)
MCV: 74.3 fL — ABNORMAL LOW (ref 78.0–100.0)
MCV: 74.3 fL — ABNORMAL LOW (ref 78.0–100.0)
Platelets: 370 10*3/uL (ref 150–400)
Platelets: 286 10*3/uL (ref 150–400)
Platelets: 290 10*3/uL (ref 150–400)
RBC: 2.47 MIL/uL — ABNORMAL LOW (ref 3.87–5.11)
RBC: 3.38 MIL/uL — ABNORMAL LOW (ref 3.87–5.11)
RBC: 3.54 MIL/uL — ABNORMAL LOW (ref 3.87–5.11)
RDW: 20.4 % — ABNORMAL HIGH (ref 11.5–15.5)
RDW: 19.6 % — ABNORMAL HIGH (ref 11.5–15.5)
RDW: 19.7 % — ABNORMAL HIGH (ref 11.5–15.5)
WBC: 9.9 10*3/uL (ref 4.0–10.5)
WBC: 16 10*3/uL — ABNORMAL HIGH (ref 4.0–10.5)
WBC: 17.9 10*3/uL — ABNORMAL HIGH (ref 4.0–10.5)

## 2011-10-12 LAB — IRON AND TIBC
Iron: <10 ug/dL — ABNORMAL LOW (ref 42–135)
UIBC: >450 ug/dL — ABNORMAL HIGH (ref 125–400)

## 2011-10-12 LAB — DIFFERENTIAL
Basophils Absolute: 0 10*3/uL (ref 0.0–0.1)
Basophils Relative: 0 % (ref 0–1)
Eosinophils Absolute: 0.1 10*3/uL (ref 0.0–0.7)
Eosinophils Relative: 1 % (ref 0–5)
Lymphocytes Relative: 30 % (ref 12–46)
Lymphs Abs: 3 10*3/uL (ref 0.7–4.0)
Monocytes Absolute: 0.5 10*3/uL (ref 0.1–1.0)
Monocytes Relative: 5 % (ref 3–12)
Neutro Abs: 6.3 10*3/uL (ref 1.7–7.7)
Neutrophils Relative %: 64 % (ref 43–77)

## 2011-10-12 LAB — FOLATE: Folate: 16 ng/mL

## 2011-10-12 LAB — COMPREHENSIVE METABOLIC PANEL
ALT: 19 U/L (ref 0–35)
AST: 25 U/L (ref 0–37)
Albumin: 3.3 g/dL — ABNORMAL LOW (ref 3.5–5.2)
Alkaline Phosphatase: 114 U/L (ref 39–117)
BUN: 25 mg/dL — ABNORMAL HIGH (ref 6–23)
CO2: 22 mEq/L (ref 19–32)
Calcium: 8.8 mg/dL (ref 8.4–10.5)
Chloride: 104 mEq/L (ref 96–112)
Creatinine, Ser: 0.75 mg/dL (ref 0.50–1.10)
GFR calc Af Amer: >90 mL/min (ref >90)
GFR calc non Af Amer: 86 mL/min — ABNORMAL LOW (ref >90)
Glucose, Bld: 123 mg/dL — ABNORMAL HIGH (ref 70–99)
Potassium: 3.4 mEq/L — ABNORMAL LOW (ref 3.5–5.1)
Sodium: 136 mEq/L (ref 135–145)
Total Bilirubin: 0.2 mg/dL — ABNORMAL LOW (ref 0.3–1.2)
Total Protein: 8.1 g/dL (ref 6.0–8.3)

## 2011-10-12 LAB — EXPECTORATED SPUTUM ASSESSMENT W GRAM STAIN, RFLX TO RESP C

## 2011-10-12 LAB — MAGNESIUM: Magnesium: 2.2 mg/dL (ref 1.5–2.5)

## 2011-10-12 LAB — URINALYSIS, ROUTINE W REFLEX MICROSCOPIC
Bilirubin Urine: NEGATIVE
Glucose, UA: NEGATIVE mg/dL
Hgb urine dipstick: NEGATIVE
Ketones, ur: NEGATIVE mg/dL
Nitrite: NEGATIVE
Protein, ur: NEGATIVE mg/dL
Specific Gravity, Urine: 1.015 (ref 1.005–1.030)
Urobilinogen, UA: 1 mg/dL (ref 0.0–1.0)
pH: 5.5 (ref 5.0–8.0)

## 2011-10-12 LAB — CK TOTAL AND CKMB (NOT AT ARMC)
CK, MB: 1.8 ng/mL (ref 0.3–4.0)
Relative Index: INVALID (ref 0.0–2.5)
Total CK: 86 U/L (ref 7–177)

## 2011-10-12 LAB — POCT I-STAT TROPONIN I: Troponin i, poc: 0 ng/mL (ref 0.00–0.08)

## 2011-10-12 LAB — CARDIAC PANEL(CRET KIN+CKTOT+MB+TROPI)
CK, MB: 1.6 ng/mL (ref 0.3–4.0)
Relative Index: INVALID (ref 0.0–2.5)
Total CK: 87 U/L (ref 7–177)
Troponin I: <0.3 ng/mL (ref <0.30)

## 2011-10-12 LAB — FERRITIN: Ferritin: 2 ng/mL — ABNORMAL LOW (ref 10–291)

## 2011-10-12 LAB — OCCULT BLOOD, POC DEVICE: Fecal Occult Bld: POSITIVE

## 2011-10-12 LAB — URINE MICROSCOPIC-ADD ON

## 2011-10-12 LAB — MRSA PCR SCREENING: MRSA by PCR: NEGATIVE

## 2011-10-12 LAB — PRO B NATRIURETIC PEPTIDE: Pro B Natriuretic peptide (BNP): 273.5 pg/mL — ABNORMAL HIGH (ref 0–125)

## 2011-10-12 LAB — ABO/RH: ABO/RH(D): O POS

## 2011-10-12 LAB — VITAMIN B12: Vitamin B-12: 247 pg/mL (ref 211–911)

## 2011-10-12 LAB — EXPECTORATED SPUTUM ASSESSMENT W REFEX TO RESP CULTURE

## 2011-10-12 MED ORDER — NICOTINE 21 MG/24HR TD PT24
21.0000 mg | MEDICATED_PATCH | Freq: Every day | TRANSDERMAL | Status: DC
  Administered 2011-10-13 – 2011-10-14 (×2): 21 mg via TRANSDERMAL
  Filled 2011-10-12 (×3): qty 1

## 2011-10-12 MED ORDER — INFLUENZA VIRUS VACC SPLIT PF IM SUSP
0.5000 mL | Freq: Once | INTRAMUSCULAR | Status: DC
  Filled 2011-10-12: qty 0.5

## 2011-10-12 MED ORDER — PANTOPRAZOLE SODIUM 40 MG PO TBEC
40.0000 mg | DELAYED_RELEASE_TABLET | Freq: Two times a day (BID) | ORAL | Status: DC
  Administered 2011-10-13 – 2011-10-14 (×3): 40 mg via ORAL

## 2011-10-12 NOTE — H&P (Signed)
Hospital Admission Note Date: 10/12/2011  Patient name: Nicole Mccormick Medical record number: [id number] Date of birth: 08/02/1946 Age: 65 y.o. Gender: female PCP: KOLLAR, ELIZABETH, MD, MD ID Physician: Dr. Jeffry Hatcher  Medical Service: Internal Medicine Teaching Service  Attending physician:  Jeffrey Hatcher    1st Contact: Asah Lamay   Pager: [phone] 2nd Contact:  Jaseela Illath   Pager: [phone] After 5 pm or weekends: 1st Contact:      Pager: [phone] 2nd Contact:      Pager: [phone]  Chief Complaint: Anemia, shortness of breath  History of Present Illness: The patient is a 65 yo woman, history of HIV (CD4 = 380, VL = 76 on 08/09/11), CAD (prior reported MI), PVD, HTN, iron deficiency, and GERD, presenting with shortness of breath and anemia.  The patient is sedated at the time of our interview, and parts of the history were obtained from the patient's husband.  The patient awoke from sleep on the morning of admission gasping for air.  She sat in a chair to try to catch her breath, but continued to feel short of breath, so she called EMS.  She also notes some associated chest pain, which was alleviated by changing her positioning.  For the last 2 weeks, the patient has had increased shortness of breath when walking, only being able to walk across the room before becoming short of breath and having to stop to catch her breath.  She has also started sleeping with 2 pillows (instead of 1), but notes no PND, and no LE edema.  Previously, the patient could walk for many blocks without becoming short of breath.  The patent's husband also notes that she has had loss of appetite for the last 2 weeks, and has been eating a large amount of ice (though eating ice is a chronic problem for her).  The patient also notes a 1-week history of cough, occasionally productive of sputum (unsure of color), which comes and goes throughout the day, though with no fevers, sore throat, or headache.  The patient's  husband has had a cough productive of greenish sputum for the last 10 days, resolving.  The patient also notes an episode of chest pain 5 months ago when vacationing in [place], and reports that she had a myoview showing diffuse hypokinesis and diastolic dysfunction, and ABI showing PVD. Upon arrival to the ED, the patient was significantly agitated and tachycardic, and was given Lorazepam.  Meds: Current Outpatient Prescriptions  Medication Sig Dispense Refill  . albuterol (PROVENTIL HFA;VENTOLIN HFA) 108 (90 BASE) MCG/ACT inhaler Inhale 2 puffs into the lungs every 6 (six) hours as needed for wheezing.  6.7 g  2  . amLODipine (NORVASC) 10 MG tablet Take 1 tablet (10 mg total) by mouth daily.  30 tablet  11  . aspirin 81 MG tablet Take 81 mg by mouth daily.        . atazanavir (REYATAZ) 200 MG capsule Take 2 capsules (400 mg total) by mouth daily with breakfast. Take two tablets once daily, space 12 hours from antacids  60 capsule  5  . cyclobenzaprine (FLEXERIL) 10 MG tablet take 1 tablet by mouth twice a day  60 tablet  1  . diclofenac (VOLTAREN) 50 MG EC tablet Take 1 tablet (50 mg total) by mouth 2 (two) times daily.  60 tablet  3  . emtricitabine-tenofovir (TRUVADA) 200-300 MG per tablet Take 1 tablet by mouth daily.  30 tablet  6  . ENSURE (ENSURE) Take   1 Can by mouth 3 (three) times daily between meals.        . ferrous sulfate 325 (65 FE) MG tablet Take 1 tablet (325 mg total) by mouth daily.  30 tablet  6  . gabapentin (NEURONTIN) 300 MG capsule Take 300 mg by mouth 2 (two) times daily.        . lisinopril-hydrochlorothiazide (PRINZIDE,ZESTORETIC) 20-25 MG per tablet Take 1 tablet by mouth daily.  30 tablet  3  . nicotine (NICODERM CQ - DOSED IN MG/24 HOURS) 21 mg/24hr patch Apply 1 patch to skin and remove 24 hours later. Repeat as needed to help you stop smoking.  30 patch  0  . RABEprazole (ACIPHEX) 20 MG tablet Take 1 tablet (20 mg total) by mouth daily.  30 tablet  6     Allergies: Penicillins - shock, passes out (per medical records)  Past Medical History  Diagnosis Date  . Hypertension   . Abnormal Pap smear - ascus with HPV in 2009, colposcopy normal, followed by OB/GYN   . Asthma   . Coronary artery disease - reports 2 prior MI's, myoview in [place] 5 months ago reportedly showed diffuse hypokinesis (from prior MI's), diastolic dysfunction   . HIV infection - CD4 = 380, VL = 76 (08/09/11)   . Seizures - reports history of seizure 20 years ago, on phenobarbitol for a while (unsure of timeframe), none recently   . Varicosities   GERD Hepatitis C PVD  Past Surgical History  Procedure Date  . Abdominal hysterectomy    Family History  Problem Relation Age of Onset  . Diabetes Mother   . Ovarian cancer Sister   Iron deficiency - maternal aunt, mother brother Sickle cell trait - brother Heart problems - brother, mother, sister  Social History Smokes 1 ppd since age 14 Drinks 2-3 drinks of alcohol/week Denies illicits Retired, worked as a nurse's aid Lives with her husband and son Has some college education Has medicaid/medicare  Review of Systems: General: no fevers, chills, changes in weight Skin: no rash HEENT: no blurry vision, hearing changes, sore throat Pulm: see HPI CV: see HPI, no palpitations Abd: +1 episode of diarrhea on the night prior to admission, no abdominal pain, vomiting GU: no dysuria, hematuria, polyuria Ext: no arthralgias, myalgias Neuro: no weakness, numbness, or tingling  Physical Exam: Temp: 102.2,  BP: 136/68,  HR: 86,  RR: 25,  O2 saturation: 95% General: sedated, but arousable, able to answer simple questions HEENT: pupils equal round and reactive to light, vision grossly intact, oropharynx clear and non-erythematous, some darkened teeth  Neck: supple, no lymphadenopathy, JVD Lungs: Right upper and lower lung fields with few crackles, left lung clear to ascultation, no wheezes Heart: Grade III/VI  "blowing" holosystolic murmur Abdomen: soft, mildly-moderately tender to palpation of epigastrum, LUQ, and RUQ.  Non-distended, no rebound tenderness, no guarding.  Bowel sounds present   Rectal: External hemorrhoids noted, DRE revealed Carlyle Achenbach stool, no red blood. Msk: no joint edema, warmth, or erythema Extremities: no cyanosis, clubbing, or edema Neurologic: alert & oriented X3, cranial nerves II-XII grossly intact, strength grossly intact, sensation intact to light touch, though full neuro exam was limited by sedation   Lab results: Basic Metabolic Panel:  Basename 10/12/11 0517  NA 136  K 3.4*  CL 104  CO2 22  GLUCOSE 123*  BUN 25*  CREATININE 0.75  CALCIUM 8.8  MG --  PHOS --   Liver Function Tests:  Basename 10/12/11 0517  AST 25    ALT 19  ALKPHOS 114  BILITOT 0.2*  PROT 8.1  ALBUMIN 3.3*   CBC:  Basename 10/12/11 0517  WBC 9.9  NEUTROABS 6.3  HGB 5.1*  HCT 17.8*  MCV 72.1*  PLT 370   Cardiac Enzymes:  Basename 10/12/11 0517  CKTOTAL 86  CKMB 1.8  CKMBINDEX --  TROPONINI --   Pro-BNP: 273.5  Urinalysis: Moderate leukocytes, no nitrates, no blood  Imaging results:  Dg Chest 2 View  10/12/2011  *RADIOLOGY REPORT*  Clinical Data: Chest pain, history of asthma  CHEST - 2 VIEW  Comparison: 12/25/2010 CT  Findings: Cardiomegaly.  Mild central vascular fullness.  Mild retrocardiac consolidation no other areas of consolidation.  No pleural effusion or pneumothorax.  No acute osseous abnormality.  IMPRESSION: Cardiomegaly with central vascular congestion.  Retrocardiac consolidation; atelectasis versus pneumonia.  Original Report Authenticated By: ANDREW J. DELGAIZO, M.D.    Other results: EKG: NSR, t-wave inversions in AVR  Assessment & Plan by Problem: The patient is a 65 yo woman, history of HIV, CAD, and PVD, presenting with SOB, with a hemoglobin of 5 and FOBT +, concerning for acute blood loss anemia, +/- CHF exacerbation.  1. Acute Anemia -  Patient presents with Hb = 5.1 (previously 10.9 on 08/09/11), with no reported melena or BRBPR, but with FOBT +, concerning for acute GI bleed, possibly upper GI source given epigastric pain, history of GERD, no red blood on rectal exam.  No hematochezia, hematemesis, hematuria, history of trauma.  Patient does note a history of iron deficiency, currently taking iron supplementation, with significant pica for ice. -anemia panel drawn -transfusing 2 U PRBC's -GI consult to evaluate GI bleed -UA negative for blood  2. Community Acquired Pneumonia - Patient presents with a 1-week history of productive cough, with fever to 102.2 in the ED, and opacity seen on CXR, likely consistent with CAP.  CD4 count is adequately high to decrease suspicion for opportunistic infections. -moxifloxacin for CAP -blood cultures obtained before antibiotics were started -RT to obtain induced sputum culture  3. SOB - patient presents with shortness of breath, possibly representing CHF exacerbation, given increased orthopnea, increased SOB with exertion, and likely PND today prompting the patient to seek medical attention, with reported diastolic dysfunction seen on a Myoview 5 months ago.  However, patient does not appear acutely volume overloaded.  SOB is likely also influenced by acute anemia. -obtaining Echo to assess EF -holding IVF -consider lasix once EF assessed, if symptoms do not improve with transfusions  4. HIV - CD4 = 380, VL = 76 (08/09/11).  Patient reports stopping her ART for about 2 weeks after her hospitalization in [place] out of fear of drug interactions, but subsequently restarted these medications.  Since the patient has this period of non-compliance, we will check a genotype to assess resistance. -continue ART -check CD4, VL, genotype  5. CAD - patient reports non-exertion chest pain, relieved by changing body positions, not currently present.  However, patient reports a history of prior MI's, and a  myoview showing evidence of prior MI.  EKG showed non-specific changes, CE negative x1. -echocardiogram -cycle CE's x3  6. HTN - history of HTN, on outpatient amlodipine, lisinopril-HCTZ. -holding BP meds acutely  7. Prophy - lovenox    R2/3______________________________ Jaseela Illath, MD     R1________________________________ Lonzo Saulter, MD  ATTENDING: I performed and/or observed a history and physical examination of the patient.  I discussed the case with the residents as noted and reviewed the residents'   notes.  I agree with the findings and plan--please refer to the attending physician note for more details.  Signature________________________________  Printed Name_____________________________  

## 2011-10-13 ENCOUNTER — Other Ambulatory Visit: Payer: Self-pay

## 2011-10-13 ENCOUNTER — Other Ambulatory Visit: Payer: Self-pay | Admitting: Internal Medicine

## 2011-10-13 ENCOUNTER — Encounter (HOSPITAL_COMMUNITY): Payer: Self-pay

## 2011-10-13 ENCOUNTER — Encounter (HOSPITAL_COMMUNITY)
Admission: EM | Disposition: A | Discharge status: Home / Self Care | Payer: Self-pay | Source: Home / Self Care | Attending: Infectious Diseases

## 2011-10-13 DIAGNOSIS — D509 Iron deficiency anemia, unspecified: Secondary | ICD-10-CM | POA: Diagnosis present

## 2011-10-13 DIAGNOSIS — D126 Benign neoplasm of colon, unspecified: Secondary | ICD-10-CM

## 2011-10-13 DIAGNOSIS — D500 Iron deficiency anemia secondary to blood loss (chronic): Secondary | ICD-10-CM

## 2011-10-13 DIAGNOSIS — J189 Pneumonia, unspecified organism: Secondary | ICD-10-CM

## 2011-10-13 DIAGNOSIS — R195 Other fecal abnormalities: Secondary | ICD-10-CM

## 2011-10-13 DIAGNOSIS — R933 Abnormal findings on diagnostic imaging of other parts of digestive tract: Secondary | ICD-10-CM

## 2011-10-13 HISTORY — PX: COLONOSCOPY: SHX174

## 2011-10-13 HISTORY — PX: COLONOSCOPY: SHX5424

## 2011-10-13 HISTORY — PX: ESOPHAGOGASTRODUODENOSCOPY: SHX1529

## 2011-10-13 HISTORY — PX: ESOPHAGOGASTRODUODENOSCOPY: SHX5428

## 2011-10-13 LAB — BASIC METABOLIC PANEL
BUN: 8 mg/dL (ref 6–23)
CO2: 23 mEq/L (ref 19–32)
Calcium: 8.8 mg/dL (ref 8.4–10.5)
Chloride: 105 mEq/L (ref 96–112)
Creatinine, Ser: 0.64 mg/dL (ref 0.50–1.10)
GFR calc Af Amer: >90 mL/min (ref >90)
GFR calc non Af Amer: >90 mL/min (ref >90)
Glucose, Bld: 113 mg/dL — ABNORMAL HIGH (ref 70–99)
Potassium: 3.5 mEq/L (ref 3.5–5.1)
Sodium: 137 mEq/L (ref 135–145)

## 2011-10-13 LAB — CBC
HCT: 24.9 % — ABNORMAL LOW (ref 36.0–46.0)
HCT: 26.4 % — ABNORMAL LOW (ref 36.0–46.0)
Hemoglobin: 7.8 g/dL — ABNORMAL LOW (ref 12.0–15.0)
Hemoglobin: 8.2 g/dL — ABNORMAL LOW (ref 12.0–15.0)
MCH: 23.2 pg — ABNORMAL LOW (ref 26.0–34.0)
MCH: 23.1 pg — ABNORMAL LOW (ref 26.0–34.0)
MCHC: 31.3 g/dL (ref 30.0–36.0)
MCHC: 31.1 g/dL (ref 30.0–36.0)
MCV: 74.1 fL — ABNORMAL LOW (ref 78.0–100.0)
MCV: 74.4 fL — ABNORMAL LOW (ref 78.0–100.0)
Platelets: 268 10*3/uL (ref 150–400)
Platelets: 272 10*3/uL (ref 150–400)
RBC: 3.36 MIL/uL — ABNORMAL LOW (ref 3.87–5.11)
RBC: 3.55 MIL/uL — ABNORMAL LOW (ref 3.87–5.11)
RDW: 19.8 % — ABNORMAL HIGH (ref 11.5–15.5)
RDW: 19.8 % — ABNORMAL HIGH (ref 11.5–15.5)
WBC: 12.4 10*3/uL — ABNORMAL HIGH (ref 4.0–10.5)
WBC: 10.6 10*3/uL — ABNORMAL HIGH (ref 4.0–10.5)

## 2011-10-13 LAB — HIV ANTIBODY (ROUTINE TESTING W REFLEX): HIV: REACTIVE — AB

## 2011-10-13 SURGERY — COLONOSCOPY
Anesthesia: Moderate Sedation

## 2011-10-13 MED ORDER — MIDAZOLAM HCL 10 MG/2ML IJ SOLN
INTRAMUSCULAR | Status: DC | PRN
  Administered 2011-10-13: 1 mg via INTRAVENOUS
  Administered 2011-10-13 (×4): 2 mg via INTRAVENOUS

## 2011-10-13 MED ORDER — DIPHENHYDRAMINE HCL 25 MG PO CAPS
50.0000 mg | ORAL_CAPSULE | Freq: Four times a day (QID) | ORAL | Status: DC | PRN
  Administered 2011-10-13: 50 mg via ORAL
  Filled 2011-10-13: qty 2

## 2011-10-13 MED ORDER — DOCUSATE SODIUM 100 MG PO CAPS
100.0000 mg | ORAL_CAPSULE | Freq: Every day | ORAL | Status: DC
  Administered 2011-10-13: 100 mg via ORAL
  Filled 2011-10-13: qty 1

## 2011-10-13 MED ORDER — ATAZANAVIR SULFATE 200 MG PO CAPS
200.0000 mg | ORAL_CAPSULE | Freq: Every day | ORAL | Status: DC
  Administered 2011-10-13: 200 mg via ORAL
  Filled 2011-10-13: qty 1

## 2011-10-13 MED ORDER — MOXIFLOXACIN HCL IN NACL 400 MG/250ML IV SOLN: 400.0000 mg | INTRAVENOUS | Status: DC

## 2011-10-13 MED ORDER — FERROUS SULFATE 325 (65 FE) MG PO TABS
325.0000 mg | ORAL_TABLET | Freq: Every day | ORAL | Status: DC
  Administered 2011-10-13: 325 mg via ORAL
  Filled 2011-10-13 (×2): qty 1

## 2011-10-13 MED ORDER — MOXIFLOXACIN HCL 400 MG PO TABS: 400.0000 mg | ORAL_TABLET | Freq: Every day | ORAL | Status: DC

## 2011-10-13 MED ORDER — MIDAZOLAM HCL 10 MG/2ML IJ SOLN
INTRAMUSCULAR | Status: AC
  Filled 2011-10-13: qty 4

## 2011-10-13 MED ORDER — SODIUM CHLORIDE 0.9 % IV SOLN: Freq: Once | INTRAVENOUS | Status: DC

## 2011-10-13 MED ORDER — EMTRICITABINE-TENOFOVIR DF 200-300 MG PO TABS
1.0000 | ORAL_TABLET | Freq: Every day | ORAL | Status: DC
  Filled 2011-10-13: qty 1

## 2011-10-13 MED ORDER — ASPIRIN 81 MG PO CHEW
CHEWABLE_TABLET | ORAL | Status: AC
  Filled 2011-10-13: qty 1

## 2011-10-13 MED ORDER — FENTANYL CITRATE 0.05 MG/ML IJ SOLN
INTRAMUSCULAR | Status: DC | PRN
  Administered 2011-10-13 (×4): 25 ug via INTRAVENOUS

## 2011-10-13 MED ORDER — FENTANYL CITRATE 0.05 MG/ML IJ SOLN
INTRAMUSCULAR | Status: AC
  Filled 2011-10-13: qty 4

## 2011-10-13 MED ORDER — EMTRICITABINE-TENOFOVIR DF 200-300 MG PO TABS
1.0000 | ORAL_TABLET | Freq: Every day | ORAL | Status: DC
  Administered 2011-10-13 – 2011-10-14 (×2): 1 via ORAL
  Filled 2011-10-13 (×2): qty 1

## 2011-10-13 MED ORDER — ATAZANAVIR SULFATE 200 MG PO CAPS
400.0000 mg | ORAL_CAPSULE | Freq: Every day | ORAL | Status: DC
Start: 2011-10-14 — End: 2011-10-14
  Administered 2011-10-14: 400 mg via ORAL
  Filled 2011-10-13 (×2): qty 2

## 2011-10-13 MED ORDER — WHITE PETROLATUM GEL
Status: AC
  Filled 2011-10-13: qty 5

## 2011-10-13 MED ORDER — STERILE WATER FOR IRRIGATION IR SOLN
Status: DC | PRN
  Administered 2011-10-13: 12:00:00

## 2011-10-13 MED ORDER — BUTAMBEN-TETRACAINE-BENZOCAINE 2-2-14 % EX AERO
INHALATION_SPRAY | CUTANEOUS | Status: DC | PRN
  Administered 2011-10-13 (×2): 1 via TOPICAL

## 2011-10-13 MED ORDER — ALBUTEROL SULFATE HFA 108 (90 BASE) MCG/ACT IN AERS
2.0000 | INHALATION_SPRAY | RESPIRATORY_TRACT | Status: DC | PRN
  Administered 2011-10-14: 2 via RESPIRATORY_TRACT

## 2011-10-13 MED ORDER — ASPIRIN 81 MG PO TABS
81.0000 mg | ORAL_TABLET | Freq: Every day | ORAL | Status: DC
  Administered 2011-10-13: 81 mg via ORAL
  Filled 2011-10-13 (×3): qty 1

## 2011-10-13 NOTE — Interval H&P Note (Signed)
History and Physical Interval Note:   10/13/2011   11:18 AM   Nicole Mccormick  has presented today for surgery, with the diagnosis of anemia and heme + stool. The various methods of treatment have been discussed with the patient and family. After consideration of risks, benefits and other options for treatment, the patient has consented to  Procedure(s): COLONOSCOPY ESOPHAGOGASTRODUODENOSCOPY (EGD) as a surgical intervention .  The patients' history has been reviewed, patient examined, no change in status, stable for surgery.  I have reviewed the patients' chart and labs.  Questions were answered to the patient's satisfaction.     Stan Head  MD

## 2011-10-13 NOTE — Progress Notes (Signed)
Subjective: Patient doing well this morning, reporting no shortness of breath, chest pain, agitation, or sedation.  Patient noted no BRBPR or other source of bleeding overnight.  Patient was able to tolerate full GI prep for planned EGD +/- colonoscopy today.  Hb remains stable around 7.8 s/p transfusion yesterday.  Objective: Vital signs in last 24 hours: Filed Vitals:   10/12/11 1700 10/13/11 0400 10/13/11 0700 10/13/11 0921  BP:  141/65 156/61 173/88  Pulse:  85 80   Temp:  98.8 F (37.1 C) 99.5 F (37.5 C) 98.7 F (37.1 C)  TempSrc:  Oral Oral Oral  Resp:  27 17 16   Height: 5\' 2"  (1.575 m)     Weight: 141 lb 1.5 oz (64 kg)     SpO2:  97% 95% 97%    Intake/Output Summary (Last 24 hours) at 10/13/11 1610 Last data filed at 10/13/11 0300  Gross per 24 hour  Intake   2000 ml  Output    800 ml  Net   1200 ml   PEX General: alert, cooperative, and in no apparent distress HEENT: pupils equal round and reactive to light, vision grossly intact, oropharynx clear and non-erythematous  Neck: supple, no lymphadenopathy, no JVD Lungs: Right upper and lower lung fields with few crackles, left lung clear to ascultation, no wheezes Heart: Grade III/VI "blowing" holosystolic murmur Abdomen: soft, mildly tender to palpation of epigastrum, though improved since yesterday's exam. Non-distended, no rebound tenderness, no guarding. Bowel sounds present. Msk: no joint edema, warmth, or erythema  Extremities: no cyanosis, clubbing, or edema Neurologic: alert & oriented X3, cranial nerves II-XII grossly intact, strength grossly intact, sensation intact to light touch, though full neuro exam was limited by sedation   Lab Results: Basic Metabolic Panel:  Lab 10/13/11 9604 10/12/11 0823 10/12/11 0517  NA 137 -- 136  K 3.5 -- 3.4*  CL 105 -- 104  CO2 23 -- 22  GLUCOSE 113* -- 123*  BUN 8 -- 25*  CREATININE 0.64 -- 0.75  CALCIUM 8.8 -- 8.8  MG -- 2.2 --  PHOS -- -- --   Liver Function  Tests:  Lab 10/12/11 0517  AST 25  ALT 19  ALKPHOS 114  BILITOT 0.2*  PROT 8.1  ALBUMIN 3.3*   CBC:  Lab 10/13/11 0400 10/12/11 2147 10/12/11 0517  WBC 12.4* 17.9* --  NEUTROABS -- -- 6.3  HGB 7.8* 8.4* --  HCT 24.9* 26.3* --  MCV 74.1* 74.3* --  PLT 268 290 --   Cardiac Enzymes:  Lab 10/12/11 1530 10/12/11 0517  CKTOTAL 87 86  CKMB 1.6 1.8  CKMBINDEX -- --  TROPONINI <0.30 --   BNP:  Lab 10/12/11 0825  POCBNP 273.5*   Anemia Panel:  Lab 10/12/11 0823  VITAMINB12 247  FOLATE 16.0  FERRITIN 2*  TIBC NOT CALC  IRON <10*  RETICCTPCT --    Micro Results: Recent Results (from the past 240 hour(s))  CULTURE, SPUTUM-ASSESSMENT     Status: Normal   Collection Time   10/12/11  5:12 PM      Component Value Range Status Comment   Specimen Description SPUTUM   Final    Special Requests NONE   Final    Sputum evaluation     Final    Value: MICROSCOPIC FINDINGS SUGGEST THAT THIS SPECIMEN IS NOT REPRESENTATIVE OF LOWER RESPIRATORY SECRETIONS. PLEASE RECOLLECT.     CALLED TO RN C.GOLDEN AT 1843 BY L.PITT 10/12/11   Report Status 10/12/2011 FINAL  Final   MRSA PCR SCREENING     Status: Normal   Collection Time   10/12/11  5:20 PM      Component Value Range Status Comment   MRSA by PCR NEGATIVE  NEGATIVE  Final    Studies/Results: Dg Chest 2 View  10/12/2011  *RADIOLOGY REPORT*  Clinical Data: Chest pain, history of asthma  CHEST - 2 VIEW  Comparison: 12/25/2010 CT  Findings: Cardiomegaly.  Mild central vascular fullness.  Mild retrocardiac consolidation no other areas of consolidation.  No pleural effusion or pneumothorax.  No acute osseous abnormality.  IMPRESSION: Cardiomegaly with central vascular congestion.  Retrocardiac consolidation; atelectasis versus pneumonia.  Original Report Authenticated By: Waneta Martins, M.D.   Echocardiogram 10/12/11 Study Conclusions  - Left ventricle: The cavity size was normal. Wall thickness   was increased in a pattern of  mild LVH. Systolic function   was vigorous. The estimated ejection fraction was in the   range of 65% to 70%. There was dynamic obstruction, with   mid-cavity obliteration. Doppler parameters are consistent   with abnormal left ventricular relaxation (grade 1   diastolic dysfunction). Doppler parameters are consistent   with high ventricular filling pressure. - Pulmonary arteries: PA peak pressure: 42mm Hg (S). - Impressions: There is a mild LV outflow tract gradient due   to vigorous LV contraction which likely accounts for   patient's murmur. No significant aortic valve disease. No   SAM. Impressions:  - There is a mild LV outflow tract gradient due to vigorous   LV contraction which likely accounts for patient's murmur.   No significant aortic valve disease. No SAM.   Medications: I have reviewed the patient's current medications.    . influenza  inactive virus vaccine  0.5 mL Intramuscular Once  . moxifloxacin  400 mg Intravenous Q24H  . nicotine  21 mg Transdermal Daily  . pantoprazole  40 mg Oral BID  . white petrolatum       Continuous Infusions:  PRN Meds:.albuterol  Assessment/Plan: The patient is a 65 yo woman, history of HIV, CAD, and PVD, presenting with SOB, with a hemoglobin of 5 and FOBT +, concerning for acute blood loss anemia, +/- CHF exacerbation.   1. Acute on Chronic Anemia - Patient presents with Hb = 5.1 (previously 10.9 on 08/09/11), with no reported melena or BRBPR, but with FOBT +, concerning for acute GI bleed, possibly upper GI source given epigastric pain, history of GERD, no red blood on rectal exam. Hb has improved to 7.8 s/p transfusion 2 Units pRBC. Patient notes a history of iron deficiency, and has a ferritin of 2 on admission. -plan for EGD +/- colonoscopy today, we greatly appreciate GI's assistance -monitor cbc's, transfuse as necessary -will begin iron supplementation when tolerating PO  2. Community Acquired Pneumonia - Patient presents  with a 1-week history of productive cough, with fever to 102.2 in the ED, and opacity seen on CXR, likely consistent with CAP. CD4 count is adequately high to decrease suspicion for opportunistic infections. -moxifloxacin for CAP  -blood cultures pending  3. SOB - Likely secondary to anemia, improved.  Initially concerning for CHF exacerbation, since patient presents with increased orthopnea, increased SOB with exertion, and likely PND on admission, with reported diastolic dysfunction seen on a Myoview 5 months ago. However, echo shows EF = 65-70%, patient improved with blood transfusions, and patient does not appear volume overloaded.  Grade 1 diastolic dysfunction seen on echo. -resolved  4. HIV -  CD4 = 380, VL = 76 (08/09/11). Patient reports stopping her ART for about 2 weeks after her hospitalization in [place] out of fear of drug interactions, but subsequently restarted these medications. Since the patient has this period of non-compliance, we will check a genotype to assess resistance.  -continue ART  -check CD4, VL, genotype   5. HTN - history of HTN, on outpatient amlodipine, lisinopril-HCTZ.  -holding BP meds acutely   7. Prophy - SCD's    LOS: 1 day   Nicole Mccormick 10/13/2011, 9:26 AM

## 2011-10-13 NOTE — H&P (View-Only) (Signed)
Hospital Admission Note Date: 10/12/2011  Patient name: Nicole Mccormick Medical record number: [id number] Date of birth: [birth year] Age: 65 y.o. Gender: female PCP: Genella Mech, MD, MD ID Physician: Dr. Reggie Pile  Medical Service: Internal Medicine Teaching Service  Attending physician:  Johny Sax    1st Contact: Janalyn Harder   Pager: [phone] 2nd Contact:  Almyra Deforest   Pager: [phone] After 5 pm or weekends: 1st Contact:      Pager: [phone] 2nd Contact:      Pager: [phone]  Chief Complaint: Anemia, shortness of breath  History of Present Illness: The patient is a 65 yo woman, history of HIV (CD4 = 380, VL = 76 on 08/09/11), CAD (prior reported MI), PVD, HTN, iron deficiency, and GERD, presenting with shortness of breath and anemia.  The patient is sedated at the time of our interview, and parts of the history were obtained from the patient's husband.  The patient awoke from sleep on the morning of admission gasping for air.  She sat in a chair to try to catch her breath, but continued to feel short of breath, so she called EMS.  She also notes some associated chest pain, which was alleviated by changing her positioning.  For the last 2 weeks, the patient has had increased shortness of breath when walking, only being able to walk across the room before becoming short of breath and having to stop to catch her breath.  She has also started sleeping with 2 pillows (instead of 1), but notes no PND, and no LE edema.  Previously, the patient could walk for many blocks without becoming short of breath.  The patent's husband also notes that she has had loss of appetite for the last 2 weeks, and has been eating a large amount of ice (though eating ice is a chronic problem for her).  The patient also notes a 1-week history of cough, occasionally productive of sputum (unsure of color), which comes and goes throughout the day, though with no fevers, sore throat, or headache.  The patient's  husband has had a cough productive of greenish sputum for the last 10 days, resolving.  The patient also notes an episode of chest pain 5 months ago when vacationing in [place], and reports that she had a myoview showing diffuse hypokinesis and diastolic dysfunction, and ABI showing PVD. Upon arrival to the ED, the patient was significantly agitated and tachycardic, and was given Lorazepam.  Meds: Current Outpatient Prescriptions  Medication Sig Dispense Refill  . albuterol (PROVENTIL HFA;VENTOLIN HFA) 108 (90 BASE) MCG/ACT inhaler Inhale 2 puffs into the lungs every 6 (six) hours as needed for wheezing.  6.7 g  2  . amLODipine (NORVASC) 10 MG tablet Take 1 tablet (10 mg total) by mouth daily.  30 tablet  11  . aspirin 81 MG tablet Take 81 mg by mouth daily.        Marland Kitchen atazanavir (REYATAZ) 200 MG capsule Take 2 capsules (400 mg total) by mouth daily with breakfast. Take two tablets once daily, space 12 hours from antacids  60 capsule  5  . cyclobenzaprine (FLEXERIL) 10 MG tablet take 1 tablet by mouth twice a day  60 tablet  1  . diclofenac (VOLTAREN) 50 MG EC tablet Take 1 tablet (50 mg total) by mouth 2 (two) times daily.  60 tablet  3  . emtricitabine-tenofovir (TRUVADA) 200-300 MG per tablet Take 1 tablet by mouth daily.  30 tablet  6  . ENSURE (ENSURE) Take  1 Can by mouth 3 (three) times daily between meals.        . ferrous sulfate 325 (65 FE) MG tablet Take 1 tablet (325 mg total) by mouth daily.  30 tablet  6  . gabapentin (NEURONTIN) 300 MG capsule Take 300 mg by mouth 2 (two) times daily.        Marland Kitchen lisinopril-hydrochlorothiazide (PRINZIDE,ZESTORETIC) 20-25 MG per tablet Take 1 tablet by mouth daily.  30 tablet  3  . nicotine (NICODERM CQ - DOSED IN MG/24 HOURS) 21 mg/24hr patch Apply 1 patch to skin and remove 24 hours later. Repeat as needed to help you stop smoking.  30 patch  0  . RABEprazole (ACIPHEX) 20 MG tablet Take 1 tablet (20 mg total) by mouth daily.  30 tablet  6     Allergies: Penicillins - shock, passes out (per medical records)  Past Medical History  Diagnosis Date  . Hypertension   . Abnormal Pap smear - ascus with HPV in 2009, colposcopy normal, followed by OB/GYN   . Asthma   . Coronary artery disease - reports 2 prior MI's, myoview in [place] 5 months ago reportedly showed diffuse hypokinesis (from prior MI's), diastolic dysfunction   . HIV infection - CD4 = 380, VL = 76 (08/09/11)   . Seizures - reports history of seizure 20 years ago, on phenobarbitol for a while (unsure of timeframe), none recently   . Varicosities   GERD Hepatitis C PVD  Past Surgical History  Procedure Date  . Abdominal hysterectomy    Family History  Problem Relation Age of Onset  . Diabetes Mother   . Ovarian cancer Sister   Iron deficiency - maternal aunt, mother brother Sickle cell trait - brother Heart problems - brother, mother, sister  Social History Smokes 1 ppd since age 64 Drinks 2-3 drinks of alcohol/week Denies illicits Retired, worked as a Counsellor Lives with her husband and son Has some college education Has medicaid/medicare  Review of Systems: General: no fevers, chills, changes in weight Skin: no rash HEENT: no blurry vision, hearing changes, sore throat Pulm: see HPI CV: see HPI, no palpitations Abd: +1 episode of diarrhea on the night prior to admission, no abdominal pain, vomiting GU: no dysuria, hematuria, polyuria Ext: no arthralgias, myalgias Neuro: no weakness, numbness, or tingling  Physical Exam: Temp: 102.2,  BP: 136/68,  HR: 86,  RR: 25,  O2 saturation: 95% General: sedated, but arousable, able to answer simple questions HEENT: pupils equal round and reactive to light, vision grossly intact, oropharynx clear and non-erythematous, some darkened teeth  Neck: supple, no lymphadenopathy, JVD Lungs: Right upper and lower lung fields with few crackles, left lung clear to ascultation, no wheezes Heart: Grade III/VI  "blowing" holosystolic murmur Abdomen: soft, mildly-moderately tender to palpation of epigastrum, LUQ, and RUQ.  Non-distended, no rebound tenderness, no guarding.  Bowel sounds present   Rectal: External hemorrhoids noted, DRE revealed Shamika Pedregon stool, no red blood. Msk: no joint edema, warmth, or erythema Extremities: no cyanosis, clubbing, or edema Neurologic: alert & oriented X3, cranial nerves II-XII grossly intact, strength grossly intact, sensation intact to light touch, though full neuro exam was limited by sedation   Lab results: Basic Metabolic Panel:  [facility] 10/12/11 0517  NA 136  K 3.4*  CL 104  CO2 22  GLUCOSE 123*  BUN 25*  CREATININE 0.75  CALCIUM 8.8  MG --  PHOS --   Liver Function Tests:  [facility] 10/12/11 0517  AST 25  ALT 19  ALKPHOS 114  BILITOT 0.2*  PROT 8.1  ALBUMIN 3.3*   CBC:  [facility] 10/12/11 0517  WBC 9.9  NEUTROABS 6.3  HGB 5.1*  HCT 17.8*  MCV 72.1*  PLT 370   Cardiac Enzymes:  [facility] 10/12/11 0517  CKTOTAL 86  CKMB 1.8  CKMBINDEX --  TROPONINI --   Pro-BNP: 273.5  Urinalysis: Moderate leukocytes, no nitrates, no blood  Imaging results:  Dg Chest 2 View  10/12/2011  *RADIOLOGY REPORT*  Clinical Data: Chest pain, history of asthma  CHEST - 2 VIEW  Comparison: 12/25/2010 CT  Findings: Cardiomegaly.  Mild central vascular fullness.  Mild retrocardiac consolidation no other areas of consolidation.  No pleural effusion or pneumothorax.  No acute osseous abnormality.  IMPRESSION: Cardiomegaly with central vascular congestion.  Retrocardiac consolidation; atelectasis versus pneumonia.  Original Report Authenticated By: Waneta Martins, M.D.    Other results: EKG: NSR, t-wave inversions in AVR  Assessment & Plan by Problem: The patient is a 65 yo woman, history of HIV, CAD, and PVD, presenting with SOB, with a hemoglobin of 5 and FOBT +, concerning for acute blood loss anemia, +/- CHF exacerbation.  1. Acute Anemia -  Patient presents with Hb = 5.1 (previously 10.9 on 08/09/11), with no reported melena or BRBPR, but with FOBT +, concerning for acute GI bleed, possibly upper GI source given epigastric pain, history of GERD, no red blood on rectal exam.  No hematochezia, hematemesis, hematuria, history of trauma.  Patient does note a history of iron deficiency, currently taking iron supplementation, with significant pica for ice. -anemia panel drawn -transfusing 2 U PRBC's -GI consult to evaluate GI bleed -UA negative for blood  2. Community Acquired Pneumonia - Patient presents with a 1-week history of productive cough, with fever to 102.2 in the ED, and opacity seen on CXR, likely consistent with CAP.  CD4 count is adequately high to decrease suspicion for opportunistic infections. -moxifloxacin for CAP -blood cultures obtained before antibiotics were started -RT to obtain induced sputum culture  3. SOB - patient presents with shortness of breath, possibly representing CHF exacerbation, given increased orthopnea, increased SOB with exertion, and likely PND today prompting the patient to seek medical attention, with reported diastolic dysfunction seen on a Myoview 5 months ago.  However, patient does not appear acutely volume overloaded.  SOB is likely also influenced by acute anemia. -obtaining Echo to assess EF -holding IVF -consider lasix once EF assessed, if symptoms do not improve with transfusions  4. HIV - CD4 = 380, VL = 76 (08/09/11).  Patient reports stopping her ART for about 2 weeks after her hospitalization in [place] out of fear of drug interactions, but subsequently restarted these medications.  Since the patient has this period of non-compliance, we will check a genotype to assess resistance. -continue ART -check CD4, VL, genotype  5. CAD - patient reports non-exertion chest pain, relieved by changing body positions, not currently present.  However, patient reports a history of prior MI's, and a  myoview showing evidence of prior MI.  EKG showed non-specific changes, CE negative x1. -echocardiogram -cycle CE's x3  6. HTN - history of HTN, on outpatient amlodipine, lisinopril-HCTZ. -holding BP meds acutely  7. Prophy - lovenox    R2/3______________________________ Almyra Deforest, MD     R1________________________________ Janalyn Harder, MD  ATTENDING: I performed and/or observed a history and physical examination of the patient.  I discussed the case with the residents as noted and reviewed the residents'  notes.  I agree with the findings and plan--please refer to the attending physician note for more details.  Signature________________________________  Printed Name_____________________________

## 2011-10-13 NOTE — Progress Notes (Signed)
EGD - small hiatal hernia only  Colonoscopy - small descending polyp removed and an anorectal polypoid lesion biopsied.  No GI blood loss suspected  Treat with iron. I will follow-up pathology.Marland Kitchen

## 2011-10-13 NOTE — Progress Notes (Signed)
Dr. Manson Passey notified that pt's R arm began itching and breaking out in whelps after IV Avelox infusion began. Pt has no shortness of breath. -- new orders to be placed by Dr. Manson Passey. .me

## 2011-10-13 NOTE — Progress Notes (Signed)
  Internal Medicine Teaching Service Attending Note Date: 10/13/2011  Patient name: Nicole Mccormick  Medical record number: [id number]  Date of birth: 03-08-1946    This patient has been seen and discussed with the house staff. Please see their note for complete details. I concur with their findings with the following additions/corrections: [name] feels significantly better. She is scheduled for colonoscopy this AM. HCT stable o/n. Will change her H/H to bid for the next 24 hours. Start her on Iron supplementation. Continue her avelox (change to PO) for ? Bronchitis. TTE pending. Await her HIV labs. My great appreciation to GI for their evaluation.   Johny Sax 10/13/2011, 8:11 AM

## 2011-10-13 NOTE — OR Nursing (Signed)
VS monitor did not connect correctly and flow VS into chart. Initial VS manually entered and last procedure set manually entered. Rest of procedure VS printed and posted into shadow chart.

## 2011-10-14 ENCOUNTER — Encounter (HOSPITAL_COMMUNITY): Payer: Self-pay | Admitting: Internal Medicine

## 2011-10-14 ENCOUNTER — Ambulatory Visit (HOSPITAL_COMMUNITY): Payer: Medicare Other

## 2011-10-14 LAB — BASIC METABOLIC PANEL
BUN: 9 mg/dL (ref 6–23)
CO2: 23 mEq/L (ref 19–32)
Calcium: 9.4 mg/dL (ref 8.4–10.5)
Chloride: 100 mEq/L (ref 96–112)
Creatinine, Ser: 0.75 mg/dL (ref 0.50–1.10)
GFR calc Af Amer: >90 mL/min (ref >90)
GFR calc non Af Amer: 86 mL/min — ABNORMAL LOW (ref >90)
Glucose, Bld: 126 mg/dL — ABNORMAL HIGH (ref 70–99)
Potassium: 3.3 mEq/L — ABNORMAL LOW (ref 3.5–5.1)
Sodium: 133 mEq/L — ABNORMAL LOW (ref 135–145)

## 2011-10-14 LAB — CBC
HCT: 29.5 % — ABNORMAL LOW (ref 36.0–46.0)
Hemoglobin: 9.1 g/dL — ABNORMAL LOW (ref 12.0–15.0)
MCH: 23.2 pg — ABNORMAL LOW (ref 26.0–34.0)
MCHC: 30.8 g/dL (ref 30.0–36.0)
MCV: 75.1 fL — ABNORMAL LOW (ref 78.0–100.0)
Platelets: 333 10*3/uL (ref 150–400)
RBC: 3.93 MIL/uL (ref 3.87–5.11)
RDW: 20 % — ABNORMAL HIGH (ref 11.5–15.5)
WBC: 9.4 10*3/uL (ref 4.0–10.5)

## 2011-10-14 LAB — T-HELPER CELLS (CD4) COUNT (NOT AT ARMC)
CD4 % Helper T Cell: 10 % — ABNORMAL LOW (ref 33–55)
CD4 T Cell Abs: 120 uL — ABNORMAL LOW (ref 400–2700)

## 2011-10-14 MED ORDER — FERROUS SULFATE 325 (65 FE) MG PO TABS: 325.0000 mg | ORAL_TABLET | Freq: Three times a day (TID) | ORAL | Status: DC

## 2011-10-14 MED ORDER — DSS 100 MG PO CAPS: 100.0000 mg | ORAL_CAPSULE | Freq: Two times a day (BID) | ORAL | Status: AC | PRN

## 2011-10-14 MED ORDER — AZITHROMYCIN 250 MG PO TABS: ORAL_TABLET | ORAL | Status: AC

## 2011-10-14 MED ORDER — AZITHROMYCIN 500 MG PO TABS
500.0000 mg | ORAL_TABLET | Freq: Once | ORAL | Status: AC
  Administered 2011-10-14: 500 mg via ORAL
  Filled 2011-10-14: qty 1

## 2011-10-14 MED ORDER — ASPIRIN 81 MG PO CHEW
CHEWABLE_TABLET | ORAL | Status: AC
  Administered 2011-10-14: 81 mg via ORAL
  Filled 2011-10-14: qty 1

## 2011-10-14 MED ORDER — ATAZANAVIR SULFATE 200 MG PO CAPS: 400.0000 mg | ORAL_CAPSULE | Freq: Every day | ORAL | Status: DC

## 2011-10-14 MED ORDER — POTASSIUM CHLORIDE CRYS ER 20 MEQ PO TBCR
40.0000 meq | EXTENDED_RELEASE_TABLET | Freq: Once | ORAL | Status: AC
  Administered 2011-10-14: 40 meq via ORAL
  Filled 2011-10-14: qty 2

## 2011-10-14 NOTE — Plan of Care (Signed)
Problem: Phase I Progression Outcomes Goal: Hemodynamically stable VS within normal limits

## 2011-10-14 NOTE — Discharge Summary (Signed)
Internal Medicine Teaching [facility] Discharge Note  Name: Nicole Mccormick MRN: [id number] DOB: April 05, 1946 65 y.o.  Date of Admission: 10/12/2011  4:24 AM Date of Discharge: 10/14/2011 Attending Physician: Johny Sax, MD  Discharge Diagnosis: 1.  Iron deficiency anemia, presented with Hb = 5.1, initially concerning for GI bleed, but EGD/colonoscopy negative, Hb stabilized s/p transfusion.  Ferritin = 2. 2.  Community acquired pneumonia - cough, febrile, leukocytosis, retrocardiac opacity on CXR 3.  HIV - previously well-controlled, CD4 = 120 during this admission though likely due to acute infection, will follow-up in ID clinic 4. Hypertension -chronic, stable  Discharge Medications: Current Discharge Medication List    START taking these medications   Details  azithromycin (ZITHROMAX) 250 MG tablet Take 1 tablet per day, starting Tuesday (11/6) Qty: 4 each, Refills: 0    docusate sodium 100 MG CAPS Take 100 mg by mouth 2 (two) times daily as needed for constipation. Qty: 30 capsule, Refills: 5    ferrous sulfate 325 (65 FE) MG tablet Take 1 tablet (325 mg total) by mouth 3 (three) times daily. Best taken on an empty stomach. Qty: 90 tablet, Refills: 5      CONTINUE these medications which have CHANGED   Details  atazanavir (REYATAZ) 200 MG capsule Take 2 capsules (400 mg total) by mouth daily. Spaced 12 hours from antacids Qty: 30 capsule, Refills: 0      CONTINUE these medications which have NOT CHANGED   Details  amLODipine (NORVASC) 10 MG tablet Take 1 tablet (10 mg total) by mouth daily. Qty: 30 tablet, Refills: 11   Associated Diagnoses: Unspecified essential hypertension    aspirin EC 81 MG tablet Take 81 mg by mouth daily.      cyclobenzaprine (FLEXERIL) 10 MG tablet take 1 tablet by mouth twice a day Qty: 60 tablet, Refills: 1   Associated Diagnoses: Restless leg syndrome    diclofenac (VOLTAREN) 50 MG EC tablet Take 1 tablet (50 mg total) by mouth 2  (two) times daily. Qty: 60 tablet, Refills: 3   Associated Diagnoses: Human immunodeficiency virus (HIV) disease    emtricitabine-tenofovir (TRUVADA) 200-300 MG per tablet Take 1 tablet by mouth daily. Qty: 30 tablet, Refills: 6   Associated Diagnoses: Human immunodeficiency virus (HIV) disease; Unspecified essential hypertension    lisinopril-hydrochlorothiazide (PRINZIDE,ZESTORETIC) 20-25 MG per tablet Take 1 tablet by mouth daily. Qty: 30 tablet, Refills: 3      STOP taking these medications     aspirin 81 MG tablet      RABEprazole (ACIPHEX) 20 MG tablet         Disposition and follow-up:   Nicole Mccormick was discharged from [facility] in stable and improved condition, with resolution of shortness of breath, and stabilization of hemoglobin.  The patient will follow-up 10/17/11 at 1:45pm at the [facility] with Dr. Baltazar Apo, who will check a cbc to ensure stabilization of hemoglobin, and assess compliance with iron supplementation, as well as assess for resolution of symptoms of CAP, s/p azithromycin.  The patient will also follow-up with Dr. Ninetta Lights in the ID clinic on 10/25/11 at 2pm, to follow-up her HIV.  Follow-up Appointments: Discharge Orders    Future Appointments: Provider: Department: Dept Phone: Center:   10/14/2011 3:00 PM [facility] [phone]   10/17/2011 1:45 PM Amanjot Sidhu [facility] [phone] [facility]   10/25/2011 1:15 PM Genella Mech, MD [facility] [phone] [facility]   11/14/2011 2:30 PM  Karyl Kinnier Scobey, DO [facility] [phone] [facility]     Future Orders Please Complete By Expires   Diet - low sodium heart healthy      Increase activity slowly      Discharge instructions      Comments:   For your Pneumonia, take Azithromycin, 1 tablet daily for 4 days, starting Tuesday (11/6).  For your anemia, begin taking iron supplementation pills, 1 tablet 3 times per day.  These pills are best  taken on an empty stomach, though they may cause some bloating, nausea, or constipation.  We recommend also taking a stool softener, Colace, twice per day to decrease your constipation.  For your HIV, continue your Reyataz and Truvada.   No wound care      Call MD for:  temperature >100.4      Call MD for:  persistant nausea and vomiting      Call MD for:  severe uncontrolled pain         Consultations: Gastroenterology  Procedures Performed:  Dg Chest 2 View  10/12/2011  *RADIOLOGY REPORT*  Clinical Data: Chest pain, history of asthma  CHEST - 2 VIEW  Comparison: 12/25/2010 CT  Findings: Cardiomegaly.  Mild central vascular fullness.  Mild retrocardiac consolidation no other areas of consolidation.  No pleural effusion or pneumothorax.  No acute osseous abnormality.  IMPRESSION: Cardiomegaly with central vascular congestion.  Retrocardiac consolidation; atelectasis versus pneumonia.  Original Report Authenticated By: Waneta Martins, M.D.    Admission HPI:  The patient is a 65 yo woman, history of HIV (CD4 = 380, VL = 76 on 08/09/11), CAD (prior reported MI), PVD, HTN, iron deficiency, and GERD, presenting with shortness of breath and anemia. The patient is sedated at the time of our interview, and parts of the history were obtained from the patient's husband. The patient awoke from sleep on the morning of admission gasping for air. She sat in a chair to try to catch her breath, but continued to feel short of breath, so she called EMS. She also notes some associated chest pain, which was alleviated by changing her positioning. For the last 2 weeks, the patient has had increased shortness of breath when walking, only being able to walk across the room before becoming short of breath and having to stop to catch her breath. She has also started sleeping with 2 pillows (instead of 1), but notes no PND, and no LE edema. Previously, the patient could walk for many blocks without becoming short of  breath. The patent's husband also notes that she has had loss of appetite for the last 2 weeks, and has been eating a large amount of ice (though eating ice is a chronic problem for her). The patient also notes a 1-week history of cough, occasionally productive of sputum (unsure of color), which comes and goes throughout the day, though with no fevers, sore throat, or headache. The patient's husband has had a cough productive of greenish sputum for the last 10 days, resolving. The patient also notes an episode of chest pain 5 months ago when vacationing in [place], and reports that she had a myoview showing diffuse hypokinesis and diastolic dysfunction, and ABI showing PVD. Upon arrival to the ED, the patient was significantly agitated and tachycardic, and was given Lorazepam.  Physical Exam:  Temp: 102.2, BP: 136/68, HR: 86, RR: 25, O2 saturation: 95%  General: sedated, but arousable, able to answer simple questions HEENT: pupils equal round and reactive to light,  vision grossly intact, oropharynx clear and non-erythematous, some darkened teeth  Neck: supple, no lymphadenopathy, JVD Lungs: Right upper and lower lung fields with few crackles, left lung clear to ascultation, no wheezes Heart: Grade III/VI "blowing" holosystolic murmur Abdomen: soft, mildly-moderately tender to palpation of epigastrum, LUQ, and RUQ. Non-distended, no rebound tenderness, no guarding. Bowel sounds present  Rectal: External hemorrhoids noted, DRE revealed Argie Lober stool, no red blood. Msk: no joint edema, warmth, or erythema  Extremities: no cyanosis, clubbing, or edema Neurologic: alert & oriented X3, cranial nerves II-XII grossly intact, strength grossly intact, sensation intact to light touch, though full neuro exam was limited by sedation  Admitting labs: Na = 136, K = 3.4, Cl = 104, CO2 = 22, BUN = 25, Cr = 0.75, Glucose = 123, WBC = 919, Hb = 5.1, HCT = 17.8, PLT = 370, Ferritin = 2  Hospital Course by problem  list: 1.  Iron deficiency anemia - the patient presented with shortness of breath, and a hemoglobin of 5.1, with FOBT+, initially concerning for GI bleed.  The patient was transfused 2 units of RBC's, and her hemoglobin rose to 7.9.  EGD showed a small hiatal hernia, and colonoscopy removed 1 non-bleeding polyp, and biopsied 1 anorectal polyp, but revealed no source of bleeding.  The patient's hemoglobin stabilized, and was 9.1 at discharge.  The patient was found to have a ferritin of 2, and noted significant pica.  The patient's anemia was thought to be due to iron deficiency, and the patient was started on iron supplementation TID.  2.  Community acquired pneumonia - The patient presented with a 1-week history of productive cough, with fever to 102.2 in the ED, and a retrocardiac opacity seen on CXR, likely representing CAP.  The patient was initially given IV moxifloxacin, but developed itching and a rash at the IV site with administration of this medication.  The patient was subsequently given Azithromycin, and was discharged to complete a 5-day course of this medication.  The patient was afebrile for the 24 hours prior to discharge.  3.  HIV - The patient has a history of HIV, with his last CD4 = 380 on 08/09/11.  The patient notes a 2-week history of medication non-compliance during a prior hospitalization 5 months ago, but that she has since been compliant with her medications.  A CD4, VL, and genotype were sent.  Her CD4 was found to be 120, though this likely reflects her current acute problems listed above, rather than true disease progression.  VL and genotype are still pending.  The patient will follow-up with Dr. Ninetta Lights in the ID clinic on 11/16 to follow-up this issue.  4. Hypertension - The patient has a history of hypertension.  Her antihypertensives were held in the acute setting, and she was discharged on her home medication regimen.  Discharge Vitals:  BP 168/72  Pulse 77  Temp(Src)  98.2 F (36.8 C) (Oral)  Resp 20  Ht 5\' 2"  (1.575 m)  Wt 141 lb 1.5 oz (64 kg)  BMI 25.81 kg/m2  SpO2 99%  Discharge Labs:  Results for orders placed during the hospital encounter of 10/12/11 (from the past 24 hour(s))  CBC     Status: Abnormal   Collection Time   10/13/11  5:47 PM      Component Value Range   WBC 10.6 (*) 4.0 - 10.5 (K/uL)   RBC 3.55 (*) 3.87 - 5.11 (MIL/uL)   Hemoglobin 8.2 (*) 12.0 - 15.0 (g/dL)  HCT 26.4 (*) 36.0 - 46.0 (%)   MCV 74.4 (*) 78.0 - 100.0 (fL)   MCH 23.1 (*) 26.0 - 34.0 (pg)   MCHC 31.1  30.0 - 36.0 (g/dL)   RDW 16.1 (*) 09.6 - 15.5 (%)   Platelets 272  150 - 400 (K/uL)  CBC     Status: Abnormal   Collection Time   10/14/11  9:37 AM      Component Value Range   WBC 9.4  4.0 - 10.5 (K/uL)   RBC 3.93  3.87 - 5.11 (MIL/uL)   Hemoglobin 9.1 (*) 12.0 - 15.0 (g/dL)   HCT 04.5 (*) 40.9 - 46.0 (%)   MCV 75.1 (*) 78.0 - 100.0 (fL)   MCH 23.2 (*) 26.0 - 34.0 (pg)   MCHC 30.8  30.0 - 36.0 (g/dL)   RDW 81.1 (*) 91.4 - 15.5 (%)   Platelets 333  150 - 400 (K/uL)  BASIC METABOLIC PANEL     Status: Abnormal   Collection Time   10/14/11  9:37 AM      Component Value Range   Sodium 133 (*) 135 - 145 (mEq/L)   Potassium 3.3 (*) 3.5 - 5.1 (mEq/L)   Chloride 100  96 - 112 (mEq/L)   CO2 23  19 - 32 (mEq/L)   Glucose, Bld 126 (*) 70 - 99 (mg/dL)   BUN 9  6 - 23 (mg/dL)   Creatinine, Ser 7.82  0.50 - 1.10 (mg/dL)   Calcium 9.4  8.4 - 95.6 (mg/dL)   GFR calc non Af Amer 86 (*) >90 (mL/min)   GFR calc Af Amer >90  >90 (mL/min)    Signed: Janalyn Harder 10/14/2011, 2:25 PM

## 2011-10-14 NOTE — Plan of Care (Signed)
Problem: Phase I Progression Outcomes Goal: Pain controlled with appropriate interventions Outcome: Adequate for Discharge Pt denies any pain. Goal: OOB as tolerated unless otherwise ordered Outcome: Adequate for Discharge Pt able to ambulate without assistance to bathroom and around room and tolerates well.

## 2011-10-14 NOTE — Progress Notes (Signed)
Subjective: Overnight, the patient developed itching and rash at IV site during infusion of Avelox, which resolved with discontinuation of the medication and 1 dose of benadryl.  Today, patient notes no shortness of breath, chest pain, or fatigue today.  Patient is sitting in a chair when we walk in the room, appears cheerful, saying she feels "great!".  No BRBPR, melena.  Hb has stabilized, and in fact appears to be increasing.  EGD/colonoscopy yesterday showed no evidence of bleed.  Objective: Vital signs in last 24 hours: Filed Vitals:   10/14/11 0400 10/14/11 0500 10/14/11 0600 10/14/11 0700  BP: 158/72   168/72  Pulse: 74 70 70 70  Temp: 98.7 F (37.1 C)   98.2 F (36.8 C)  TempSrc: Oral   Oral  Resp: 17 17 19 17   Height:      Weight:      SpO2: 96% 99% 96% 97%    PEX  General: alert, cooperative, and in no apparent distress HEENT: pupils equal round and reactive to light, vision grossly intact, oropharynx clear and non-erythematous  Neck: supple, no lymphadenopathy, no JVD Lungs: CTAB, no wheezes, rales, or ronchi Heart: Grade III/VI "blowing" holosystolic murmur Abdomen: soft, non-tender, non-distended, no rebound tenderness, no guarding. Bowel sounds present. Msk: no joint edema, warmth, or erythema  Extremities: no cyanosis, clubbing, or edema Neurologic: alert & oriented X3, cranial nerves II-XII grossly intact, strength grossly intact, sensation intact to light touch.   Lab Results: Basic Metabolic Panel:  Lab 10/14/11 1610 10/13/11 0400 10/12/11 0823  NA 133* 137 --  K 3.3* 3.5 --  CL 100 105 --  CO2 23 23 --  GLUCOSE 126* 113* --  BUN 9 8 --  CREATININE 0.75 0.64 --  CALCIUM 9.4 8.8 --  MG -- -- 2.2  PHOS -- -- --   CBC:  Lab 10/14/11 0937 10/13/11 1747 10/12/11 0517  WBC 9.4 10.6* --  NEUTROABS -- -- 6.3  HGB 9.1* 8.2* --  HCT 29.5* 26.4* --  MCV 75.1* 74.4* --  PLT 333 272 --   Anemia Panel:  Lab 10/12/11 0823  VITAMINB12 247  FOLATE 16.0    FERRITIN 2*  TIBC NOT CALC  IRON <10*  RETICCTPCT --   Medications: I have reviewed the patient's current medications. Scheduled Meds:   . sodium chloride   Intravenous Once  . aspirin      . aspirin  81 mg Oral Daily  . atazanavir  400 mg Oral Q breakfast  . azithromycin  500 mg Oral Once  . docusate sodium  100 mg Oral QHS  . emtricitabine-tenofovir  1 tablet Oral Daily  . ferrous sulfate  325 mg Oral QHS  . influenza  inactive virus vaccine  0.5 mL Intramuscular Once  . nicotine  21 mg Transdermal Daily  . pantoprazole  40 mg Oral BID  . white petrolatum      . DISCONTD: atazanavir  200 mg Oral Daily  . DISCONTD: emtricitabine-tenofovir  1 tablet Oral Daily  . DISCONTD: moxifloxacin  400 mg Intravenous Q24H  . DISCONTD: moxifloxacin  400 mg Oral Daily   Continuous Infusions:  PRN Meds:.albuterol, diphenhydrAMINE, DISCONTD: butamben-tetracaine-benzocaine, DISCONTD: fentaNYL, DISCONTD: midazolam, DISCONTD: simethicone susp in sterile water 1000 mL irrigation  Assessment/Plan: The patient is a 65 yo woman, history of HIV, CAD, and PVD, presenting with SOB, with a hemoglobin of 5 and FOBT +, though with EGD/colonoscopy revealing no acute source of bleed, and subsequent stabilization of hemoglobin.  1. Acute  on Chronic Anemia - Patient presents with Hb = 5.1 (previously 10.9 on 08/09/11), with no reported melena or BRBPR, but with FOBT +, concerning for acute GI bleed, though subsequent EGD/colonoscopy revealed no source of bleeding. Hb has improved s/p transfusion 2 Units pRBC, and has now stabilized. Patient likely has chronic iron deficiency anemia, and presented with a ferritin of 2. -EGD/colonoscopy negative for bleeding source -Hb has stabilized -PO iron supplementation  2. Community Acquired Pneumonia - Patient presents with a 1-week history of productive cough, with fever to 102.2 in the ED, and opacity seen on CXR, likely consistent with CAP. CD4 count is adequately high  to decrease suspicion for opportunistic infections.  Patient had itching/rash with Moxifloxacin.  Will discharge on azithromycin. -PO azithromycin x5 days -blood cultures negative to date  3. SOB - Likely secondary to anemia, improved. Initially concerning for CHF exacerbation, since patient presents with increased orthopnea, increased SOB with exertion, and likely PND on admission, with reported diastolic dysfunction seen on a Myoview 5 months ago. However, echo shows EF = 65-70%, patient improved with blood transfusions, and patient does not appear volume overloaded. Grade 1 diastolic dysfunction seen on echo.  -resolved   4. HIV - CD4 = 380, VL = 76 (08/09/11). Patient reports stopping her ART for about 2 weeks after her hospitalization in [place] out of fear of drug interactions, but subsequently restarted these medications. Since the patient has this period of non-compliance, we will check a genotype to assess resistance.  -continue ART  -CD4 has decreased to 120, though this likely represents acute infection, rather than true disease progression.  The patient will follow-up at ID clinic in about 2 weeks.  5. HTN - history of HTN, on outpatient amlodipine, lisinopril-HCTZ.  -resume outpatient BP meds  7. Prophy - SCD's   LOS: 2 days   Janalyn Harder 10/14/2011, 11:28 AM

## 2011-10-14 NOTE — Progress Notes (Signed)
  Internal Medicine Teaching Service Attending Note Date: 10/14/2011  Patient name: Nicole Mccormick  Medical record number: [id number]  Date of birth: 1946-01-14    This patient has been seen and discussed with the house staff. Please see their note for complete details. I concur with their findings with the following additions/corrections: Pt doing well, her HCT is stable. Will continue her current ART (she tolerated RTV poorly previously). She will avoid PPIs unless neccessary in future. Will send her home, she has f/u with me in 2 weeks.   Johny Sax 10/14/2011, 9:22 AM

## 2011-10-15 LAB — CROSSMATCH
ABO/RH(D): O POS
Antibody Screen: NEGATIVE
Unit division: 0
Unit division: 0
Unit division: 0
Unit division: 0

## 2011-10-16 ENCOUNTER — Encounter: Payer: Self-pay | Admitting: Internal Medicine

## 2011-10-16 LAB — HIV-1 RNA QUANT-NO REFLEX-BLD
HIV 1 RNA Quant: <20 copies/mL (ref <20)
HIV-1 RNA Quant, Log: <1.3 {Log} (ref <1.30)

## 2011-10-16 NOTE — Progress Notes (Signed)
Quick Note:  Small adenoma - recall colonoscopy 7 years if healthy Anal condyloma - follow-up with ID first ______

## 2011-10-16 NOTE — Progress Notes (Signed)
Quick Note:  The condyloma may need resection as can be premalignant I think. Will defer to Dr. Ninetta Lights. ______

## 2011-10-17 ENCOUNTER — Encounter: Payer: Medicare Other | Admitting: Internal Medicine

## 2011-10-17 LAB — HIV 1/2 CONFIRMATION
HIV-1 antibody: POSITIVE
HIV-2 Ab: NEGATIVE

## 2011-10-18 LAB — CULTURE, BLOOD (ROUTINE X 2)
Culture  Setup Time: 201211031354
Culture  Setup Time: 201211031354
Culture  Setup Time: 201211032233
Culture  Setup Time: 201211032233
Culture: NO GROWTH
Culture: NO GROWTH
Culture: NO GROWTH
Culture: NO GROWTH

## 2011-10-25 ENCOUNTER — Ambulatory Visit (INDEPENDENT_AMBULATORY_CARE_PROVIDER_SITE_OTHER): Payer: Medicare Other | Admitting: Internal Medicine

## 2011-10-25 ENCOUNTER — Encounter: Payer: Self-pay | Admitting: Internal Medicine

## 2011-10-25 ENCOUNTER — Inpatient Hospital Stay: Payer: Medicare Other | Admitting: Infectious Diseases

## 2011-10-25 VITALS — BP 150/76 | HR 86 | Temp 97.8°F | Ht 62.0 in | Wt 144.3 lb

## 2011-10-25 DIAGNOSIS — B20 Human immunodeficiency virus [HIV] disease: Secondary | ICD-10-CM

## 2011-10-25 DIAGNOSIS — D509 Iron deficiency anemia, unspecified: Secondary | ICD-10-CM

## 2011-10-25 DIAGNOSIS — D649 Anemia, unspecified: Secondary | ICD-10-CM

## 2011-10-25 DIAGNOSIS — J189 Pneumonia, unspecified organism: Secondary | ICD-10-CM

## 2011-10-25 DIAGNOSIS — F17200 Nicotine dependence, unspecified, uncomplicated: Secondary | ICD-10-CM

## 2011-10-25 DIAGNOSIS — I10 Essential (primary) hypertension: Secondary | ICD-10-CM

## 2011-10-25 DIAGNOSIS — G2581 Restless legs syndrome: Secondary | ICD-10-CM

## 2011-10-25 MED ORDER — NICOTINE 21 MG/24HR TD PT24: 1.0000 | MEDICATED_PATCH | TRANSDERMAL | Status: AC

## 2011-10-25 MED ORDER — GABAPENTIN 300 MG PO CAPS: 300.0000 mg | ORAL_CAPSULE | Freq: Three times a day (TID) | ORAL | Status: DC

## 2011-10-25 MED ORDER — ENSURE NUTRA SHAKE HI-CAL PO LIQD: 1.0000 | Freq: Two times a day (BID) | ORAL | Status: DC

## 2011-10-25 MED ORDER — ZOSTER VACCINE LIVE 19400 UNT/0.65ML ~~LOC~~ SOLR: 0.6500 mL | Freq: Once | SUBCUTANEOUS | Status: AC

## 2011-10-25 NOTE — Assessment & Plan Note (Signed)
Patient is currently smoking one third pack per day cigarettes. This is down from previously in August  1.5 packs per day. Will re-prescribe her nicotine patches to help with her quitting. Did reinforce the need to eventually go to 0 cigarettes per day. Did stress that this may help her leg pain.

## 2011-10-25 NOTE — Assessment & Plan Note (Signed)
Patient is still taking her HIV medication as prescribed. Did continue to stress the need to take this every day. Will followup with Dr. Ninetta Lights.

## 2011-10-25 NOTE — Assessment & Plan Note (Signed)
Patient is continuing to take her iron supplementation 3 times daily. We will recheck a CBC at today's visit to ensure that it is stable from hospitalization.

## 2011-10-25 NOTE — Progress Notes (Signed)
Subjective:    Patient ID: Nicole Mccormick, female    DOB: 1946-04-02, 65 y.o.   MRN: [id number]  HPI The patient is a 65 year old female comes in today for a followup to hospitalization. She was hospitalized for anemia due to iron deficiency as well as community-acquired pneumonia. She was given Avelox for her tinea card pneumonia. She is feeling much better in this regard. Her breathing is still not quite back to normal but is much improved from the way that was. She is not having difficulty breathing. Her cough is resolved. She still doesn't have as much stamina as she used to. She is using a cane to stabilize herself when she walks. She did have colonoscopy and EGD during this hospitalization to evaluate for GI loss of blood. She was determined to have iron deficiency anemia mixed with some chronic disease anemia. She was instructed to take iron 3 times daily. She states that she has been taking it since hospitalization. She is having occasional restless leg syndrome in her legs. Which she would like to try some different medication for. She is currently taking diclofenac and Flexeril. The Flexeril is helping her however that diclofenac she does not believe is helping her at all. She has not had any fevers or chills at home. She denied any chest pain shortness of breath. Otherwise she is feeling good. She has decreased her cigarette intake from 1.5 packs per day to one third pack per day. She would like to get a refill of her patches. She would also like a refill ensure as she feels she's not taking adequate nutrition and would like to have this appointment available. No other complaints at today's visit.   Review of Systems  Constitutional: Negative for fever, chills, activity change, appetite change, fatigue and unexpected weight change.  HENT: Negative.   Eyes: Negative.   Respiratory: Negative for cough, chest tightness, shortness of breath and wheezing.   Cardiovascular: Negative for chest pain,  palpitations and leg swelling.  Gastrointestinal: Negative for nausea, vomiting, abdominal pain, diarrhea and constipation.  Musculoskeletal: Positive for gait problem.       Patient is using a cane to ambulate, has not fallen recently.  Skin: Negative.   Neurological: Negative for dizziness, tremors, seizures, syncope, facial asymmetry, speech difficulty, weakness, light-headedness, numbness and headaches.    Vitals: Blood pressure: 150/76 Pulse: 86 Temperature: 97.38F Weight: 144 pounds    Objective:   Physical Exam  Constitutional: She is oriented to person, place, and time. She appears well-developed and well-nourished. No distress.  HENT:  Head: Normocephalic and atraumatic.  Eyes: EOM are normal. Pupils are equal, round, and reactive to light.  Neck: Normal range of motion. Neck supple. No JVD present. No thyromegaly present.  Cardiovascular: Normal rate, regular rhythm and normal heart sounds.   Pulmonary/Chest: Effort normal and breath sounds normal. No respiratory distress. She has no wheezes. She exhibits no tenderness.  Abdominal: Soft. Bowel sounds are normal. She exhibits no distension. There is no tenderness. There is no rebound.  Musculoskeletal: Normal range of motion.       Patient ambulating with a cane. No problems with gait abnormality.  Neurological: She is alert and oriented to person, place, and time. No cranial nerve deficit.  Skin: Skin is warm and dry.          Assessment & Plan:  1. Please see problem oriented charting.  2. Disposition-patient will be seen back in December to evaluate for resolution of symptoms with gabapentin.  We will also check to see she has gotten Zostavax. Did give her prescription to take to [facility]. Did also give her nicotine patches today. Did also give her prescription for Ensure. We will do a recheck of her blood pressure at next visit and if it is still elevated we will make change to medication regimen at that time. She  does have followup with OB/GYN as there was slight abnormality to her Pap test. She states that this happened before and it runs in the family. She will also get a mammogram in December. She is also following up with Dr. Ninetta Lights for her HIV. She continues to take her HIV medication regularly.

## 2011-10-25 NOTE — Assessment & Plan Note (Signed)
Patient is currently on iron supplementation which may help with her restless leg syndrome. We will also start gabapentin today and see if this also helps with her restless leg syndrome. She is currently taking Flexeril for her leg problems. We will continue her Flexeril and she states that it does help. We will see her back in December to check on the progression or regression of these symptoms.

## 2011-10-25 NOTE — Patient Instructions (Signed)
You were seen today for a hospital follow up. We are adding gabapentin for your nerve pain in your legs. Take it once daily for 3 days then twice daily for 3 days then up to 3 times daily for 3 days. We will see you back in 1 month to follow up on the results of this medication. If you need to be seen sooner or have any questions please call our office. Our number is [phone].

## 2011-10-25 NOTE — Assessment & Plan Note (Signed)
Patient's cough is much better, she did continue taking antibiotics until they're gone. She has not had any fevers since discharge from hospital. Her breathing is also much better. She states she's not quite back to normal but is "way better" than she was.

## 2011-10-25 NOTE — Assessment & Plan Note (Signed)
Patient is currently taking amlodipine and lisinopril/HCTZ. Her blood pressure is better today than it was at past outpatient visits. Blood pressure was 150/76 today. Will recheck in December and if blood pressure is still elevated may make medication changes.

## 2011-10-26 LAB — CBC
HCT: 30.3 % — ABNORMAL LOW (ref 36.0–46.0)
Hemoglobin: 8.8 g/dL — ABNORMAL LOW (ref 12.0–15.0)
MCH: 24.1 pg — ABNORMAL LOW (ref 26.0–34.0)
MCHC: 29 g/dL — ABNORMAL LOW (ref 30.0–36.0)
MCV: 83 fL (ref 78.0–100.0)
Platelets: 482 10*3/uL — ABNORMAL HIGH (ref 150–400)
RBC: 3.65 MIL/uL — ABNORMAL LOW (ref 3.87–5.11)
RDW: 26.3 % — ABNORMAL HIGH (ref 11.5–15.5)
WBC: 8.5 10*3/uL (ref 4.0–10.5)

## 2011-11-01 ENCOUNTER — Encounter (HOSPITAL_COMMUNITY): Payer: Self-pay | Admitting: Internal Medicine

## 2011-11-14 ENCOUNTER — Encounter: Payer: Medicare Other | Admitting: Family Medicine

## 2011-11-18 ENCOUNTER — Encounter: Payer: Self-pay | Admitting: Infectious Diseases

## 2011-11-18 ENCOUNTER — Ambulatory Visit (INDEPENDENT_AMBULATORY_CARE_PROVIDER_SITE_OTHER): Payer: Medicare Other | Admitting: Infectious Diseases

## 2011-11-18 VITALS — BP 164/75 | HR 85 | Temp 98.0°F | Ht 62.5 in | Wt 143.0 lb

## 2011-11-18 DIAGNOSIS — F17200 Nicotine dependence, unspecified, uncomplicated: Secondary | ICD-10-CM

## 2011-11-18 DIAGNOSIS — K5900 Constipation, unspecified: Secondary | ICD-10-CM | POA: Insufficient documentation

## 2011-11-18 DIAGNOSIS — A630 Anogenital (venereal) warts: Secondary | ICD-10-CM | POA: Insufficient documentation

## 2011-11-18 DIAGNOSIS — Z113 Encounter for screening for infections with a predominantly sexual mode of transmission: Secondary | ICD-10-CM

## 2011-11-18 DIAGNOSIS — K029 Dental caries, unspecified: Secondary | ICD-10-CM

## 2011-11-18 DIAGNOSIS — B20 Human immunodeficiency virus [HIV] disease: Secondary | ICD-10-CM

## 2011-11-18 DIAGNOSIS — Z79899 Other long term (current) drug therapy: Secondary | ICD-10-CM

## 2011-11-18 DIAGNOSIS — D509 Iron deficiency anemia, unspecified: Secondary | ICD-10-CM

## 2011-11-18 MED ORDER — DOCUSATE SODIUM 100 MG PO CAPS: 100.0000 mg | ORAL_CAPSULE | Freq: Two times a day (BID) | ORAL | Status: DC

## 2011-11-18 NOTE — Assessment & Plan Note (Signed)
Will refer her for surgical eval

## 2011-11-18 NOTE — Assessment & Plan Note (Signed)
She has cut back significantly, i encouraged her to keep cutting back.

## 2011-11-18 NOTE — Assessment & Plan Note (Signed)
She appears to be doing well. She has gotten flu and pnvx. Her CD4 in hospital was down, suspect due to acute illness. Will repeat at next visit with yearly labs. rtc 3 months.

## 2011-11-18 NOTE — Assessment & Plan Note (Signed)
She has f/u with dental and will get further extractions/plate.

## 2011-11-18 NOTE — Assessment & Plan Note (Signed)
My great appreciation to the IM service for partnering with me

## 2011-11-18 NOTE — Assessment & Plan Note (Signed)
Most likely iron related, continue stool softener.

## 2011-11-18 NOTE — Progress Notes (Signed)
  Subjective:    Patient ID: Nicole Mccormick, female    DOB: November 01, 1946, 65 y.o.   MRN: [id number]  HPI 65 yo F with hx of HIV/AIDS adm 11-3 to 11-5 with severe anemia (HgB 5.1). She had a EGD (small hiatal hernia) and colonoscpy (desc polyp tubular adenoma, anorecatl polypoid lesion- condyloma). She improved in hospital with 2 transfusions and iron. CD4 120 and VL < 20. She was also dx with CAP and treated with avelox.  Feeling better now, "off and on good". States she fell at home in the bathtub right after she got out of the bath tub. Has home nursing/help. Has some difficulty with ambulation, pain in her legs.    Review of Systems  Constitutional: Negative for fever, chills, appetite change and unexpected weight change.  Respiratory: Negative for shortness of breath.   Gastrointestinal: Positive for constipation. Negative for diarrhea and blood in stool.  Genitourinary: Negative for dysuria.       Objective:   Physical Exam  Constitutional: She appears well-developed and well-nourished.  HENT:  Mouth/Throat: Oropharynx is clear and moist. No oropharyngeal exudate.  Eyes: EOM are normal. Pupils are equal, round, and reactive to light.  Neck: Neck supple.  Cardiovascular: Normal rate and regular rhythm.   Murmur heard. Pulmonary/Chest: Effort normal and breath sounds normal. No respiratory distress. She has no wheezes. She has no rales.  Abdominal: Soft. Bowel sounds are normal. She exhibits no distension. There is no tenderness.  Lymphadenopathy:    She has no cervical adenopathy.          Assessment & Plan:

## 2011-11-27 NOTE — Progress Notes (Signed)
I discussed the patient with Dr Kollar and agree with the note contained here.   

## 2011-11-29 ENCOUNTER — Other Ambulatory Visit: Payer: Self-pay | Admitting: Infectious Diseases

## 2011-12-05 ENCOUNTER — Telehealth: Payer: Self-pay | Admitting: Licensed Clinical Social Worker

## 2011-12-05 ENCOUNTER — Other Ambulatory Visit: Payer: Self-pay | Admitting: Internal Medicine

## 2011-12-05 DIAGNOSIS — J111 Influenza due to unidentified influenza virus with other respiratory manifestations: Secondary | ICD-10-CM

## 2011-12-05 MED ORDER — GUAIFENESIN-CODEINE 100-10 MG/5ML PO SYRP: 5.0000 mL | ORAL_SOLUTION | Freq: Three times a day (TID) | ORAL | Status: DC | PRN

## 2011-12-05 MED ORDER — OSELTAMIVIR PHOSPHATE 75 MG PO CAPS: 75.0000 mg | ORAL_CAPSULE | Freq: Two times a day (BID) | ORAL | Status: AC

## 2011-12-05 NOTE — Telephone Encounter (Signed)
Patient called stating she thinks she has the flu, cough, congestions, fever(103) body aches and chills. She is requesting something for cough to be called in to [facility] on [address] please advise.

## 2011-12-05 NOTE — Progress Notes (Signed)
Patient called into clinic with complaint of fever up to 103, cough, myalgias x 3 days. C/w ILI. Will call in rx for oseltamivir and robitussin-codeine. Can also take tylenol for fevers.

## 2011-12-08 ENCOUNTER — Other Ambulatory Visit: Payer: Self-pay | Admitting: Internal Medicine

## 2011-12-08 DIAGNOSIS — J111 Influenza due to unidentified influenza virus with other respiratory manifestations: Secondary | ICD-10-CM

## 2011-12-08 MED ORDER — HYDROCOD POLST-CHLORPHEN POLST 10-8 MG/5ML PO LQCR: 5.0000 mL | Freq: Two times a day (BID) | ORAL | Status: DC

## 2011-12-08 MED ORDER — GUAIFENESIN-CODEINE 100-10 MG/5ML PO SYRP: 5.0000 mL | ORAL_SOLUTION | Freq: Three times a day (TID) | ORAL | Status: AC | PRN

## 2011-12-08 NOTE — Progress Notes (Signed)
Patient called with cough that has been going for couple of days. Is taking tamiflu. Wants something for cough which is not OTC (she does not have any money for OTC meds at this times, says all prescription meds are free with her insurance) will sent Rx for tussionex at her pharmacy.

## 2011-12-11 ENCOUNTER — Encounter (INDEPENDENT_AMBULATORY_CARE_PROVIDER_SITE_OTHER): Payer: Self-pay | Admitting: General Surgery

## 2011-12-11 ENCOUNTER — Ambulatory Visit (INDEPENDENT_AMBULATORY_CARE_PROVIDER_SITE_OTHER): Payer: Medicare HMO | Admitting: General Surgery

## 2011-12-11 VITALS — BP 152/80 | HR 68 | Temp 97.6°F | Resp 20 | Ht 62.0 in | Wt 149.6 lb

## 2011-12-11 DIAGNOSIS — K644 Residual hemorrhoidal skin tags: Secondary | ICD-10-CM

## 2011-12-11 NOTE — Progress Notes (Signed)
Subjective:     Patient ID: Nicole Mccormick, female   DOB: 05-10-1946, 66 y.o.   MRN: [id number]  HPI Patient is followed in the infectious disease clinic by Dr. Ninetta Lights. We are asked to evaluate for anal condylomata. The patient does not have any significant symptoms. She claims she occasionally has hemorrhoid symptoms but this comes and goes. She has not noticed any growths around her anal area. She has no blood with bowel movements. She is taking Colace to help with constipation. She has no abdominal pain  Review of Systems  Constitutional: Negative.   HENT: Negative.   Eyes: Negative.   Respiratory: Negative.   Cardiovascular: Negative.   Gastrointestinal:       See history of present illness  Genitourinary: Negative.   Neurological:       Seizure disorder  Hematological:       Recent anemia       Objective:   Physical Exam  HENT:       Wearing mask due to flu  complaints  Neck: Normal range of motion. Neck supple.  Cardiovascular: Normal rate and regular rhythm.   Pulmonary/Chest: Effort normal and breath sounds normal.  Abdominal: Soft. She exhibits no distension. There is no tenderness. There is no rebound.       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 No evidence of anal condylomata    Plan:     There is no indication for surgical treatment of these anal skin tags at this time. I recommend continuing Colace to avoid constipation. Plan was discussed in detail with the patient

## 2011-12-18 ENCOUNTER — Ambulatory Visit: Payer: Medicaid Other | Admitting: Infectious Diseases

## 2011-12-20 ENCOUNTER — Encounter: Payer: Self-pay | Admitting: Family Medicine

## 2011-12-23 ENCOUNTER — Other Ambulatory Visit: Payer: Self-pay | Admitting: *Deleted

## 2011-12-23 MED ORDER — LISINOPRIL-HYDROCHLOROTHIAZIDE 20-25 MG PO TABS: 1.0000 | ORAL_TABLET | Freq: Every day | ORAL | Status: DC

## 2012-01-16 ENCOUNTER — Encounter: Payer: Self-pay | Admitting: Family Medicine

## 2012-01-16 ENCOUNTER — Ambulatory Visit (INDEPENDENT_AMBULATORY_CARE_PROVIDER_SITE_OTHER): Payer: Medicaid Other | Admitting: Family Medicine

## 2012-01-16 DIAGNOSIS — R87622 Low grade squamous intraepithelial lesion on cytologic smear of vagina (LGSIL): Secondary | ICD-10-CM

## 2012-01-16 NOTE — Progress Notes (Signed)
Patient given informed consent, signed copy in the chart, time out was performed.  Placed in lithotomy position. Vaginal cuff viewed with speculum and colposcope after application of acetic acid.   Colposcopy adequate?  yes Acetowhite lesions?flat acetowhite central lesion with mild friability.   Punctation?no Mosaicism?  no Abnormal vasculature?  no Biopsies?no ECC?n/a   Low Grade lesion in appearance.  No biopsy was taken.  Will follow with PAP in 6 months. Dr Scheryl Darter precepted the procedure and agreed with assessment and plan.

## 2012-01-16 NOTE — Assessment & Plan Note (Signed)
Colpo 2/7 - low grade in appearance.  No biopsy taken Repeat pap in 6 months.

## 2012-02-12 ENCOUNTER — Telehealth: Payer: Self-pay | Admitting: *Deleted

## 2012-02-12 NOTE — Telephone Encounter (Signed)
She wanted her aciphex & flexeril refilled. I told her there was a note that she should get these from her pcp. She has an appt with pcp this Friday & will get them there. I explained she stills sees her md here but it is for HIV only. Her pcp will handle everything not related to hiv.  She was satisfied with the answer

## 2012-02-14 ENCOUNTER — Encounter: Payer: Medicaid Other | Admitting: Internal Medicine

## 2012-02-17 ENCOUNTER — Other Ambulatory Visit: Payer: Self-pay | Admitting: *Deleted

## 2012-02-17 ENCOUNTER — Ambulatory Visit: Payer: Medicare Other | Admitting: Infectious Diseases

## 2012-02-18 MED ORDER — RABEPRAZOLE SODIUM 20 MG PO TBEC: 20.0000 mg | DELAYED_RELEASE_TABLET | Freq: Every day | ORAL | Status: DC

## 2012-02-18 MED ORDER — CYCLOBENZAPRINE HCL 10 MG PO TABS: 10.0000 mg | ORAL_TABLET | Freq: Two times a day (BID) | ORAL | Status: DC | PRN

## 2012-02-28 ENCOUNTER — Encounter: Payer: Medicaid Other | Admitting: Internal Medicine

## 2012-03-17 ENCOUNTER — Other Ambulatory Visit: Payer: Medicare HMO

## 2012-03-17 DIAGNOSIS — Z79899 Other long term (current) drug therapy: Secondary | ICD-10-CM

## 2012-03-17 DIAGNOSIS — B20 Human immunodeficiency virus [HIV] disease: Secondary | ICD-10-CM

## 2012-03-17 DIAGNOSIS — Z113 Encounter for screening for infections with a predominantly sexual mode of transmission: Secondary | ICD-10-CM

## 2012-03-17 LAB — COMPLETE METABOLIC PANEL WITH GFR
ALT: 17 U/L (ref 0–35)
AST: 24 U/L (ref 0–37)
Albumin: 4 g/dL (ref 3.5–5.2)
Alkaline Phosphatase: 99 U/L (ref 39–117)
BUN: 14 mg/dL (ref 6–23)
CO2: 27 mEq/L (ref 19–32)
Calcium: 8.9 mg/dL (ref 8.4–10.5)
Chloride: 104 mEq/L (ref 96–112)
Creat: 0.83 mg/dL (ref 0.50–1.10)
GFR, Est African American: 84 mL/min
GFR, Est Non African American: 73 mL/min
Glucose, Bld: 99 mg/dL (ref 70–99)
Potassium: 4 mEq/L (ref 3.5–5.3)
Sodium: 136 mEq/L (ref 135–145)
Total Bilirubin: 0.9 mg/dL (ref 0.3–1.2)
Total Protein: 7.8 g/dL (ref 6.0–8.3)

## 2012-03-17 LAB — CBC
HCT: 33.2 % — ABNORMAL LOW (ref 36.0–46.0)
Hemoglobin: 9.3 g/dL — ABNORMAL LOW (ref 12.0–15.0)
MCH: 23.5 pg — ABNORMAL LOW (ref 26.0–34.0)
MCHC: 28 g/dL — ABNORMAL LOW (ref 30.0–36.0)
MCV: 83.8 fL (ref 78.0–100.0)
Platelets: 321 10*3/uL (ref 150–400)
RBC: 3.96 MIL/uL (ref 3.87–5.11)
RDW: 24.8 % — ABNORMAL HIGH (ref 11.5–15.5)
WBC: 5.8 10*3/uL (ref 4.0–10.5)

## 2012-03-17 LAB — RPR

## 2012-03-17 LAB — LIPID PANEL
Cholesterol: 103 mg/dL (ref 0–200)
HDL: 47 mg/dL (ref >39)
LDL Cholesterol: 48 mg/dL (ref 0–99)
Total CHOL/HDL Ratio: 2.2 Ratio
Triglycerides: 42 mg/dL (ref <150)
VLDL: 8 mg/dL (ref 0–40)

## 2012-03-18 LAB — T-HELPER CELL (CD4) - (RCID CLINIC ONLY)
CD4 % Helper T Cell: 20 % — ABNORMAL LOW (ref 33–55)
CD4 T Cell Abs: 410 uL (ref 400–2700)

## 2012-03-19 LAB — HIV-1 RNA QUANT-NO REFLEX-BLD
HIV 1 RNA Quant: <20 copies/mL (ref <20)
HIV-1 RNA Quant, Log: <1.3 {Log} (ref <1.30)

## 2012-04-01 ENCOUNTER — Encounter: Payer: Self-pay | Admitting: Infectious Diseases

## 2012-04-01 ENCOUNTER — Other Ambulatory Visit: Payer: Self-pay | Admitting: *Deleted

## 2012-04-01 ENCOUNTER — Ambulatory Visit (INDEPENDENT_AMBULATORY_CARE_PROVIDER_SITE_OTHER): Payer: Medicare HMO | Admitting: Infectious Diseases

## 2012-04-01 VITALS — BP 146/73 | HR 69 | Temp 98.2°F | Ht 62.5 in | Wt 148.0 lb

## 2012-04-01 DIAGNOSIS — I2510 Atherosclerotic heart disease of native coronary artery without angina pectoris: Secondary | ICD-10-CM

## 2012-04-01 DIAGNOSIS — I10 Essential (primary) hypertension: Secondary | ICD-10-CM

## 2012-04-01 DIAGNOSIS — M129 Arthropathy, unspecified: Secondary | ICD-10-CM

## 2012-04-01 DIAGNOSIS — B20 Human immunodeficiency virus [HIV] disease: Secondary | ICD-10-CM

## 2012-04-01 DIAGNOSIS — M1990 Unspecified osteoarthritis, unspecified site: Secondary | ICD-10-CM

## 2012-04-01 DIAGNOSIS — K219 Gastro-esophageal reflux disease without esophagitis: Secondary | ICD-10-CM

## 2012-04-01 DIAGNOSIS — Z1231 Encounter for screening mammogram for malignant neoplasm of breast: Secondary | ICD-10-CM

## 2012-04-01 MED ORDER — EMTRICITABINE-TENOFOVIR DF 200-300 MG PO TABS: 1.0000 | ORAL_TABLET | Freq: Every day | ORAL | Status: DC

## 2012-04-01 MED ORDER — ATAZANAVIR SULFATE 200 MG PO CAPS: 400.0000 mg | ORAL_CAPSULE | Freq: Every day | ORAL | Status: DC

## 2012-04-01 MED ORDER — CYCLOBENZAPRINE HCL 10 MG PO TABS: 10.0000 mg | ORAL_TABLET | Freq: Two times a day (BID) | ORAL | Status: DC | PRN

## 2012-04-01 MED ORDER — ENSURE NUTRA SHAKE HI-CAL PO LIQD: 1.0000 | Freq: Two times a day (BID) | ORAL | Status: DC

## 2012-04-01 MED ORDER — LISINOPRIL-HYDROCHLOROTHIAZIDE 20-25 MG PO TABS: 1.0000 | ORAL_TABLET | Freq: Every day | ORAL | Status: DC

## 2012-04-01 MED ORDER — AMLODIPINE BESYLATE 10 MG PO TABS: 10.0000 mg | ORAL_TABLET | Freq: Every day | ORAL | Status: DC

## 2012-04-01 NOTE — Assessment & Plan Note (Signed)
She is doing well on her current rx. Will see her back in 6 months. Given condoms. Needs mammogram.

## 2012-04-01 NOTE — Assessment & Plan Note (Addendum)
Will have her eval by CV. Probably needs stress test. Could be GERD related, but states she has had previous+

## 2012-04-01 NOTE — Assessment & Plan Note (Signed)
Suggested she try OTC prilosec at 2x dose since she is not able to get aciphex.

## 2012-04-01 NOTE — Progress Notes (Signed)
  Subjective:    Patient ID: Nicole Mccormick, female    DOB: [birth year], 66 y.o.   MRN: [id number]  HPI 66 yo F with hx of HIV/AIDS previously with severe anemia (HgB 5.1). She had a EGD (small hiatal hernia) and colonoscpy (desc polyp tubular adenoma, anorecatl polypoid lesion- condyloma).  HIV 1 RNA Quant (copies/mL)  Date Value  03/17/2012 <20   10/12/2011 <20   08/09/2011 76*     CD4 T Cell Abs (cmm)  Date Value  03/17/2012 410   10/12/2011 120*  08/09/2011 380*    Feels well. Taking meds, no problems. Having some problems getting aciphex, insurance won't pay for. Has not tried OTCs at high dose.   Review of Systems  Constitutional: Negative for appetite change and unexpected weight change.  Respiratory: Positive for chest tightness.        Occas episodes of chest tightness, resolved over 2 hours. Felt from neck down. No diaphoresis, no sob.   Gastrointestinal: Negative for diarrhea and constipation.  Genitourinary: Negative for dysuria and difficulty urinating.       Objective:   Physical Exam  Constitutional: She appears well-developed and well-nourished.  Eyes: EOM are normal. Pupils are equal, round, and reactive to light.  Neck: Neck supple.  Cardiovascular: Normal rate, regular rhythm and normal heart sounds.   Pulmonary/Chest: Effort normal and breath sounds normal.  Abdominal: Soft. Bowel sounds are normal. She exhibits no distension. There is no tenderness.  Musculoskeletal: She exhibits no edema.  Lymphadenopathy:    She has no cervical adenopathy.          Assessment & Plan:

## 2012-04-02 ENCOUNTER — Encounter: Payer: Self-pay | Admitting: *Deleted

## 2012-04-02 NOTE — Progress Notes (Signed)
Patient ID: Nicole Mccormick, female   DOB: 1946-10-24, 66 y.o.   MRN: [id number]  Pt has appt with Dr. Karenann Cai at [facility] on Wed, Apr 15, 2012 @ 9:15am. Have called pt and sent letter as reminders of appt. Tacey Heap RN

## 2012-04-16 ENCOUNTER — Other Ambulatory Visit: Payer: Self-pay | Admitting: Licensed Clinical Social Worker

## 2012-04-16 DIAGNOSIS — B20 Human immunodeficiency virus [HIV] disease: Secondary | ICD-10-CM

## 2012-04-16 MED ORDER — ENSURE NUTRA SHAKE HI-CAL PO LIQD: 1.0000 | Freq: Two times a day (BID) | ORAL | Status: DC

## 2012-04-23 ENCOUNTER — Encounter: Payer: Medicaid Other | Admitting: Internal Medicine

## 2012-04-24 ENCOUNTER — Ambulatory Visit
Admission: RE | Admit: 2012-04-24 | Discharge: 2012-04-24 | Disposition: A | Discharge status: Home / Self Care | Payer: PRIVATE HEALTH INSURANCE | Source: Ambulatory Visit | Attending: Infectious Diseases | Admitting: Infectious Diseases

## 2012-04-24 DIAGNOSIS — Z1231 Encounter for screening mammogram for malignant neoplasm of breast: Secondary | ICD-10-CM

## 2012-04-28 ENCOUNTER — Other Ambulatory Visit: Payer: Self-pay | Admitting: Infectious Diseases

## 2012-04-28 DIAGNOSIS — R928 Other abnormal and inconclusive findings on diagnostic imaging of breast: Secondary | ICD-10-CM

## 2012-05-13 ENCOUNTER — Ambulatory Visit
Admission: RE | Admit: 2012-05-13 | Discharge: 2012-05-13 | Disposition: A | Discharge status: Home / Self Care | Payer: PRIVATE HEALTH INSURANCE | Source: Ambulatory Visit | Attending: Infectious Diseases | Admitting: Infectious Diseases

## 2012-05-13 DIAGNOSIS — R928 Other abnormal and inconclusive findings on diagnostic imaging of breast: Secondary | ICD-10-CM

## 2012-07-10 ENCOUNTER — Encounter: Payer: PRIVATE HEALTH INSURANCE | Admitting: Internal Medicine

## 2012-07-10 ENCOUNTER — Encounter: Payer: Self-pay | Admitting: *Deleted

## 2012-07-20 NOTE — Progress Notes (Signed)
Patient ID: Nicole Mccormick, female   DOB: 08/22/1946, 66 y.o.   MRN: [id number] Opened in error, No show.

## 2012-07-21 ENCOUNTER — Encounter: Payer: Self-pay | Admitting: Internal Medicine

## 2012-07-23 ENCOUNTER — Encounter: Payer: PRIVATE HEALTH INSURANCE | Admitting: Internal Medicine

## 2012-07-30 ENCOUNTER — Ambulatory Visit: Payer: PRIVATE HEALTH INSURANCE | Admitting: Cardiovascular Disease

## 2012-08-07 ENCOUNTER — Encounter: Payer: PRIVATE HEALTH INSURANCE | Admitting: Internal Medicine

## 2012-09-16 ENCOUNTER — Other Ambulatory Visit: Payer: PRIVATE HEALTH INSURANCE

## 2012-09-30 ENCOUNTER — Other Ambulatory Visit: Payer: PRIVATE HEALTH INSURANCE

## 2012-09-30 ENCOUNTER — Ambulatory Visit: Payer: PRIVATE HEALTH INSURANCE | Admitting: Infectious Diseases

## 2012-10-07 ENCOUNTER — Encounter: Payer: Self-pay | Admitting: Internal Medicine

## 2012-10-07 NOTE — Progress Notes (Signed)
  This patient is a CHRONIC NO-SHOW PATIENT that has a history of HYPERTENSION.  Please make sure to address hypertension during her next clinic appointment, and intervene as appropriate.    Within the AVS, please incorporate the following smartphrase: .htntips   Pertinent Data: BP Readings from Last 3 Encounters:  04/01/12 146/73  01/16/12 158/61  12/11/11 152/80    BMI: Estimated Body mass index is 26.64 kg/(m^2) as calculated from the following:   Height as of 04/01/12: 5' 2.5"(1.588 m).   Weight as of 04/01/12: 148 lb(67.132 kg).  Smoking History: History  Smoking status  . Current Every Day Smoker -- 0.2 packs/day for 50 years  . Types: Cigarettes  Smokeless tobacco  . Former Neurosurgeon    Last Basic Metabolic Panel:    Component Value Date/Time   NA 136 03/17/2012 1146   K 4.0 03/17/2012 1146   CL 104 03/17/2012 1146   CO2 27 03/17/2012 1146   BUN 14 03/17/2012 1146   CREATININE 0.83 03/17/2012 1146   CREATININE 0.75 10/14/2011 0937   GLUCOSE 99 03/17/2012 1146   CALCIUM 8.9 03/17/2012 1146    Allergies: Allergies  Allergen Reactions  . Penicillins     REACTION: GOES INTO SHOCK & THEN PASSES OUT  . Avelox (Moxifloxacin Hcl In Nacl) Itching and Rash

## 2012-10-12 ENCOUNTER — Telehealth: Payer: Self-pay | Admitting: *Deleted

## 2012-10-12 NOTE — Telephone Encounter (Signed)
Agree with Er eval. Thanks 

## 2012-10-12 NOTE — Telephone Encounter (Signed)
Pt call with c/o not feeling well.  Nausea, dizziness and not feeling right.  On set 2 days ago. She is c/o chest pain around breast. On and off. Rates pain 3/10. Denies vomiting, SOB, but is diaphoretic.  With symptoms and Hx pt advised to be evaluated in ED now.  I did not give appointment for tomorrow.  Hx CAD

## 2012-10-16 ENCOUNTER — Other Ambulatory Visit: Payer: PRIVATE HEALTH INSURANCE

## 2012-10-16 DIAGNOSIS — B20 Human immunodeficiency virus [HIV] disease: Secondary | ICD-10-CM

## 2012-10-16 LAB — CBC WITH DIFFERENTIAL/PLATELET
Basophils Absolute: 0 10*3/uL (ref 0.0–0.1)
Basophils Relative: 1 % (ref 0–1)
Eosinophils Absolute: 0.1 10*3/uL (ref 0.0–0.7)
Eosinophils Relative: 2 % (ref 0–5)
HCT: 26 % — ABNORMAL LOW (ref 36.0–46.0)
Hemoglobin: 8 g/dL — ABNORMAL LOW (ref 12.0–15.0)
Lymphocytes Relative: 40 % (ref 12–46)
Lymphs Abs: 2.4 10*3/uL (ref 0.7–4.0)
MCH: 24.8 pg — ABNORMAL LOW (ref 26.0–34.0)
MCHC: 30.8 g/dL (ref 30.0–36.0)
MCV: 80.5 fL (ref 78.0–100.0)
Monocytes Absolute: 0.5 10*3/uL (ref 0.1–1.0)
Monocytes Relative: 9 % (ref 3–12)
Neutro Abs: 2.8 10*3/uL (ref 1.7–7.7)
Neutrophils Relative %: 48 % (ref 43–77)
Platelets: 356 10*3/uL (ref 150–400)
RBC: 3.23 MIL/uL — ABNORMAL LOW (ref 3.87–5.11)
RDW: 17.2 % — ABNORMAL HIGH (ref 11.5–15.5)
WBC: 5.9 10*3/uL (ref 4.0–10.5)

## 2012-10-16 LAB — COMPREHENSIVE METABOLIC PANEL
ALT: 21 U/L (ref 0–35)
AST: 24 U/L (ref 0–37)
Albumin: 3.6 g/dL (ref 3.5–5.2)
Alkaline Phosphatase: 65 U/L (ref 39–117)
BUN: 24 mg/dL — ABNORMAL HIGH (ref 6–23)
CO2: 26 mEq/L (ref 19–32)
Calcium: 8.6 mg/dL (ref 8.4–10.5)
Chloride: 103 mEq/L (ref 96–112)
Creat: 0.76 mg/dL (ref 0.50–1.10)
Glucose, Bld: 91 mg/dL (ref 70–99)
Potassium: 3.9 mEq/L (ref 3.5–5.3)
Sodium: 138 mEq/L (ref 135–145)
Total Bilirubin: 0.7 mg/dL (ref 0.3–1.2)
Total Protein: 7.2 g/dL (ref 6.0–8.3)

## 2012-10-16 LAB — T-HELPER CELL (CD4) - (RCID CLINIC ONLY)
CD4 % Helper T Cell: 21 % — ABNORMAL LOW (ref 33–55)
CD4 T Cell Abs: 510 uL (ref 400–2700)

## 2012-10-18 ENCOUNTER — Emergency Department (HOSPITAL_COMMUNITY)
Admission: EM | Admit: 2012-10-18 | Discharge: 2012-10-18 | Disposition: A | Discharge status: Home / Self Care | Payer: PRIVATE HEALTH INSURANCE | Attending: Emergency Medicine | Admitting: Emergency Medicine

## 2012-10-18 ENCOUNTER — Encounter (HOSPITAL_COMMUNITY): Payer: Self-pay | Admitting: Emergency Medicine

## 2012-10-18 DIAGNOSIS — F17200 Nicotine dependence, unspecified, uncomplicated: Secondary | ICD-10-CM | POA: Insufficient documentation

## 2012-10-18 DIAGNOSIS — R112 Nausea with vomiting, unspecified: Secondary | ICD-10-CM | POA: Insufficient documentation

## 2012-10-18 DIAGNOSIS — R1013 Epigastric pain: Secondary | ICD-10-CM | POA: Insufficient documentation

## 2012-10-18 DIAGNOSIS — I509 Heart failure, unspecified: Secondary | ICD-10-CM | POA: Insufficient documentation

## 2012-10-18 DIAGNOSIS — I10 Essential (primary) hypertension: Secondary | ICD-10-CM | POA: Insufficient documentation

## 2012-10-18 DIAGNOSIS — IMO0001 Reserved for inherently not codable concepts without codable children: Secondary | ICD-10-CM | POA: Insufficient documentation

## 2012-10-18 DIAGNOSIS — R011 Cardiac murmur, unspecified: Secondary | ICD-10-CM | POA: Insufficient documentation

## 2012-10-18 DIAGNOSIS — M119 Crystal arthropathy, unspecified: Secondary | ICD-10-CM | POA: Insufficient documentation

## 2012-10-18 DIAGNOSIS — R197 Diarrhea, unspecified: Secondary | ICD-10-CM | POA: Insufficient documentation

## 2012-10-18 DIAGNOSIS — I8390 Asymptomatic varicose veins of unspecified lower extremity: Secondary | ICD-10-CM | POA: Insufficient documentation

## 2012-10-18 DIAGNOSIS — D509 Iron deficiency anemia, unspecified: Secondary | ICD-10-CM | POA: Insufficient documentation

## 2012-10-18 DIAGNOSIS — G40909 Epilepsy, unspecified, not intractable, without status epilepticus: Secondary | ICD-10-CM | POA: Insufficient documentation

## 2012-10-18 DIAGNOSIS — B20 Human immunodeficiency virus [HIV] disease: Secondary | ICD-10-CM | POA: Insufficient documentation

## 2012-10-18 DIAGNOSIS — Z7982 Long term (current) use of aspirin: Secondary | ICD-10-CM | POA: Insufficient documentation

## 2012-10-18 DIAGNOSIS — I2510 Atherosclerotic heart disease of native coronary artery without angina pectoris: Secondary | ICD-10-CM | POA: Insufficient documentation

## 2012-10-18 DIAGNOSIS — K219 Gastro-esophageal reflux disease without esophagitis: Secondary | ICD-10-CM | POA: Insufficient documentation

## 2012-10-18 DIAGNOSIS — Z79899 Other long term (current) drug therapy: Secondary | ICD-10-CM | POA: Insufficient documentation

## 2012-10-18 DIAGNOSIS — J45909 Unspecified asthma, uncomplicated: Secondary | ICD-10-CM | POA: Insufficient documentation

## 2012-10-18 LAB — CBC WITH DIFFERENTIAL/PLATELET
Basophils Absolute: 0 10*3/uL (ref 0.0–0.1)
Basophils Relative: 0 % (ref 0–1)
Eosinophils Absolute: 0.1 10*3/uL (ref 0.0–0.7)
Eosinophils Relative: 2 % (ref 0–5)
HCT: 30.9 % — ABNORMAL LOW (ref 36.0–46.0)
Hemoglobin: 9.7 g/dL — ABNORMAL LOW (ref 12.0–15.0)
Lymphocytes Relative: 35 % (ref 12–46)
Lymphs Abs: 2.3 10*3/uL (ref 0.7–4.0)
MCH: 24.7 pg — ABNORMAL LOW (ref 26.0–34.0)
MCHC: 31.4 g/dL (ref 30.0–36.0)
MCV: 78.8 fL (ref 78.0–100.0)
Monocytes Absolute: 0.6 10*3/uL (ref 0.1–1.0)
Monocytes Relative: 9 % (ref 3–12)
Neutro Abs: 3.7 10*3/uL (ref 1.7–7.7)
Neutrophils Relative %: 54 % (ref 43–77)
Platelets: 386 10*3/uL (ref 150–400)
RBC: 3.92 MIL/uL (ref 3.87–5.11)
RDW: 16.5 % — ABNORMAL HIGH (ref 11.5–15.5)
WBC: 6.7 10*3/uL (ref 4.0–10.5)

## 2012-10-18 LAB — COMPREHENSIVE METABOLIC PANEL
ALT: 25 U/L (ref 0–35)
AST: 30 U/L (ref 0–37)
Albumin: 3.6 g/dL (ref 3.5–5.2)
Alkaline Phosphatase: 84 U/L (ref 39–117)
BUN: 20 mg/dL (ref 6–23)
CO2: 29 mEq/L (ref 19–32)
Calcium: 9.5 mg/dL (ref 8.4–10.5)
Chloride: 99 mEq/L (ref 96–112)
Creatinine, Ser: 0.71 mg/dL (ref 0.50–1.10)
GFR calc Af Amer: >90 mL/min (ref >90)
GFR calc non Af Amer: 87 mL/min — ABNORMAL LOW (ref >90)
Glucose, Bld: 112 mg/dL — ABNORMAL HIGH (ref 70–99)
Potassium: 3.1 mEq/L — ABNORMAL LOW (ref 3.5–5.1)
Sodium: 137 mEq/L (ref 135–145)
Total Bilirubin: 0.4 mg/dL (ref 0.3–1.2)
Total Protein: 8.5 g/dL — ABNORMAL HIGH (ref 6.0–8.3)

## 2012-10-18 LAB — URINALYSIS, ROUTINE W REFLEX MICROSCOPIC
Bilirubin Urine: NEGATIVE
Glucose, UA: NEGATIVE mg/dL
Hgb urine dipstick: NEGATIVE
Ketones, ur: NEGATIVE mg/dL
Nitrite: NEGATIVE
Protein, ur: NEGATIVE mg/dL
Specific Gravity, Urine: 1.015 (ref 1.005–1.030)
Urobilinogen, UA: 1 mg/dL (ref 0.0–1.0)
pH: 7 (ref 5.0–8.0)

## 2012-10-18 LAB — URINE MICROSCOPIC-ADD ON

## 2012-10-18 LAB — LIPASE, BLOOD: Lipase: 53 U/L (ref 11–59)

## 2012-10-18 MED ORDER — MORPHINE SULFATE 4 MG/ML IJ SOLN
4.0000 mg | Freq: Once | INTRAMUSCULAR | Status: AC
  Administered 2012-10-18: 4 mg via INTRAVENOUS
  Filled 2012-10-18: qty 1

## 2012-10-18 MED ORDER — SUCRALFATE 1 G PO TABS: 1.0000 g | ORAL_TABLET | Freq: Four times a day (QID) | ORAL | Status: DC

## 2012-10-18 MED ORDER — ONDANSETRON HCL 4 MG/2ML IJ SOLN
4.0000 mg | Freq: Once | INTRAMUSCULAR | Status: AC
  Administered 2012-10-18: 4 mg via INTRAVENOUS
  Filled 2012-10-18: qty 2

## 2012-10-18 MED ORDER — HYDROCODONE-ACETAMINOPHEN 5-325 MG PO TABS: ORAL_TABLET | ORAL | Status: DC

## 2012-10-18 MED ORDER — ONDANSETRON 8 MG PO TBDP: 8.0000 mg | ORAL_TABLET | Freq: Three times a day (TID) | ORAL | Status: DC | PRN

## 2012-10-18 MED ORDER — ESOMEPRAZOLE MAGNESIUM 40 MG PO CPDR: 40.0000 mg | DELAYED_RELEASE_CAPSULE | Freq: Every day | ORAL | Status: DC

## 2012-10-18 NOTE — ED Provider Notes (Signed)
Medical screening examination/treatment/procedure(s) were conducted as a shared visit with non-physician practitioner(s) and myself.  I personally evaluated the patient during the encounter On my exam this patient with multiple medical problems was in no distress, feeling significantly better.  Given her history of immunocompromised state, her description of abdominal and sternal discomfort there is some suspicion of esophageal lesions.  She'll be treated with new medication, discharged with gastroenterology followup.  Gerhard Munch, MD 10/18/12 1650

## 2012-10-18 NOTE — ED Notes (Signed)
Pt reports vomiting since 1400 yesterday. Pt was at work and suddenly vomited. Pt c/o generalized body aches.

## 2012-10-18 NOTE — ED Provider Notes (Signed)
History     CSN: [id number]  Arrival date & time 10/18/12  0818   First MD Initiated Contact with Patient 10/18/12 [phone]      Chief Complaint  Patient presents with  . Emesis    (Consider location/radiation/quality/duration/timing/severity/associated sxs/prior treatment) HPI Comments: Patient with history of HIV, recent CD4 count greater than 500 -- presents with acute onset of vomiting that occurred approximately 2 PM yesterday. Since that time the patient has had multiple episodes of vomiting that is nonbloody and nonbilious. Patient states that she had milky white emesis this morning. Patient has had some watery diarrhea that was nonbloody. She complains of generalized body aches. She has had some epigastric abdominal pain. She denies fever, cold symptoms, significant shortness of breath or cough, urinary symptoms. Patient states she's been compliant with her medications. Onset acute. Course is constant. Nothing makes symptoms better or worse. Patient admits to taking Goody powder "probably more than I should". She has a history of heartburn and GERD.  The history is provided by the patient.    Past Medical History  Diagnosis Date  . Hypertension   . Abnormal Pap smear   . Asthma   . Coronary artery disease   . HIV infection   . Varicosities   . Seizures     last sz fri nov 2  . GERD (gastroesophageal reflux disease)     previously on aciphex, discontinued november 2012  because patient asymptomatic, and concern about interference with HIV meds.  May try pepcid in the future if symptoms return  . Iron deficiency anemia     Ferritin = 2 in november 2012, started on iron supplemenation  . Arthritis   . CHF (congestive heart failure)   . Heart murmur     Past Surgical History  Procedure Date  . Abdominal hysterectomy   . Esophagogastroduodenoscopy 10/13/11    small hiatal hernia  . Colonoscopy 10/13/11    small adenoma, anal condyloma  . Colonoscopy 10/13/2011   Procedure: COLONOSCOPY;  Surgeon: Iva Boop, MD;  Location: [facility] ENDOSCOPY;  Service: Endoscopy;  Laterality: N/A;  . Esophagogastroduodenoscopy 10/13/2011    Procedure: ESOPHAGOGASTRODUODENOSCOPY (EGD);  Surgeon: Iva Boop, MD;  Location: [facility] ENDOSCOPY;  Service: Endoscopy;  Laterality: N/A;    Family History  Problem Relation Age of Onset  . Diabetes Mother   . Ovarian cancer Sister   . Cancer Sister     ovarian and colon  . Colon cancer Neg Hx     History  Substance Use Topics  . Smoking status: Current Every Day Smoker -- 0.2 packs/day for 50 years    Types: Cigarettes  . Smokeless tobacco: Former Neurosurgeon  . Alcohol Use: 1.2 oz/week    2 Cans of beer per week     Comment: occasional    OB History    Grav Para Term Preterm Abortions TAB SAB Ect Mult Living   6 5   1  1   5       Review of Systems  Constitutional: Negative for fever and chills.  HENT: Negative for sore throat and rhinorrhea.   Eyes: Negative for redness.  Respiratory: Negative for cough and shortness of breath.   Cardiovascular: Negative for chest pain.  Gastrointestinal: Positive for nausea, vomiting, abdominal pain and diarrhea.  Genitourinary: Negative for dysuria.  Musculoskeletal: Positive for myalgias.  Skin: Negative for rash.  Neurological: Negative for headaches.    Allergies  Penicillins and Avelox  Home Medications   Current Outpatient  Rx  Name  Route  Sig  Dispense  Refill  . AMLODIPINE BESYLATE 10 MG PO TABS   Oral   Take 1 tablet (10 mg total) by mouth daily.   30 tablet   11   . ASPIRIN EC 81 MG PO TBEC   Oral   Take 81 mg by mouth daily.           . ATAZANAVIR SULFATE 200 MG PO CAPS   Oral   Take 2 capsules (400 mg total) by mouth daily. Spaced 12 hours from antacids   60 capsule   6   . HYDROCOD POLST-CPM POLST ER 10-8 MG/5ML PO LQCR   Oral   Take 5 mLs by mouth every 12 (twelve) hours.   140 mL   0   . CYCLOBENZAPRINE HCL 10 MG PO TABS   Oral   Take 1  tablet (10 mg total) by mouth 2 (two) times daily as needed for muscle spasms.   60 tablet   3     Future refills should be sent to her pcp, Elizabet ...   . DOCUSATE SODIUM 100 MG PO CAPS   Oral   Take 1 capsule (100 mg total) by mouth 2 (two) times daily.   60 capsule   1   . EMTRICITABINE-TENOFOVIR 200-300 MG PO TABS   Oral   Take 1 tablet by mouth daily.   30 tablet   6   . FERROUS SULFATE 325 (65 FE) MG PO TABS   Oral   Take 1 tablet (325 mg total) by mouth 3 (three) times daily. Best taken on an empty stomach.   90 tablet   5   . GABAPENTIN 300 MG PO CAPS   Oral   Take 1 capsule (300 mg total) by mouth 3 (three) times daily. Take once daily for 3 days then twice daily for 3 days then 3 times daily.   90 capsule   2   . LISINOPRIL-HYDROCHLOROTHIAZIDE 20-25 MG PO TABS   Oral   Take 1 tablet by mouth daily.   30 tablet   6   . ENSURE NUTRA SHAKE HI-CAL PO LIQD   Oral   Take 1 Can by mouth 2 (two) times daily.   60 Can   6   . RABEPRAZOLE SODIUM 20 MG PO TBEC   Oral   Take 1 tablet (20 mg total) by mouth daily.   30 tablet   6     BP 138/66  Pulse 51  Temp 98.4 F (36.9 C) (Oral)  Resp 18  SpO2 100%  Physical Exam  Nursing note and vitals reviewed. Constitutional: She appears well-developed and well-nourished.  HENT:  Head: Normocephalic and atraumatic.  Eyes: Conjunctivae normal are normal. Right eye exhibits no discharge. Left eye exhibits no discharge.  Neck: Normal range of motion. Neck supple.  Cardiovascular: Normal rate, regular rhythm and normal heart sounds.   No murmur heard. Pulmonary/Chest: Effort normal. No respiratory distress. She has wheezes (mild, bases, expiratory).  Abdominal: Soft. She exhibits no distension. Bowel sounds are decreased. There is generalized tenderness (worse epigastric, RUQ). There is no rigidity, no rebound, no guarding, no CVA tenderness, no tenderness at McBurney's point and negative Murphy's sign.    Neurological: She is alert.  Skin: Skin is warm and dry.  Psychiatric: She has a normal mood and affect.    ED Course  Procedures (including critical care time)  Labs Reviewed - No data to display No results found.  1. Nausea vomiting and diarrhea     8:36 AM Patient seen and examined. Work-up initiated. Medications ordered.   Vital signs reviewed and are as follows: Filed Vitals:   10/18/12 0825  BP: 138/66  Pulse: 51  Temp: 98.4 F (36.9 C)  Resp: 18   12:09 PM Pt d/w Dr. Jeraldine Loots. On re-exam, she si doing well. She has had ice chips without vomiting. She requests nexium rx as well. Will have Dr. Jeraldine Loots see patient and likely d/c to home.  Patient counseled on use of narcotic pain medications. Counseled not to combine these medications with others containing tylenol. Urged not to drink alcohol, drive, or perform any other activities that requires focus while taking these medications. The patient verbalizes understanding and agrees with the plan.   Pt counseled on clear liquids/brat diet.      MDM  N/V/D in HIV/AIDS pt with CD4 > 500. Sx most consistent with viral gastroenteritis (duration < 24 hrs). Labs are reassuring and sx well controlled in ED. However, GI follow-up given for further eval.         Renne Crigler, PA 10/18/12 [phone]

## 2012-10-19 LAB — HIV-1 RNA QUANT-NO REFLEX-BLD
HIV 1 RNA Quant: <20 copies/mL (ref <20)
HIV-1 RNA Quant, Log: <1.3 {Log} (ref <1.30)

## 2012-10-28 ENCOUNTER — Encounter: Payer: Self-pay | Admitting: Infectious Diseases

## 2012-10-28 ENCOUNTER — Ambulatory Visit (INDEPENDENT_AMBULATORY_CARE_PROVIDER_SITE_OTHER): Payer: PRIVATE HEALTH INSURANCE | Admitting: Infectious Diseases

## 2012-10-28 ENCOUNTER — Other Ambulatory Visit: Payer: Self-pay | Admitting: *Deleted

## 2012-10-28 VITALS — BP 165/52 | HR 71 | Temp 98.0°F | Ht 62.0 in | Wt 135.0 lb

## 2012-10-28 DIAGNOSIS — F17200 Nicotine dependence, unspecified, uncomplicated: Secondary | ICD-10-CM

## 2012-10-28 DIAGNOSIS — I10 Essential (primary) hypertension: Secondary | ICD-10-CM

## 2012-10-28 DIAGNOSIS — Z79899 Other long term (current) drug therapy: Secondary | ICD-10-CM

## 2012-10-28 DIAGNOSIS — K219 Gastro-esophageal reflux disease without esophagitis: Secondary | ICD-10-CM

## 2012-10-28 DIAGNOSIS — Z113 Encounter for screening for infections with a predominantly sexual mode of transmission: Secondary | ICD-10-CM

## 2012-10-28 DIAGNOSIS — M549 Dorsalgia, unspecified: Secondary | ICD-10-CM | POA: Insufficient documentation

## 2012-10-28 DIAGNOSIS — Z23 Encounter for immunization: Secondary | ICD-10-CM

## 2012-10-28 DIAGNOSIS — B20 Human immunodeficiency virus [HIV] disease: Secondary | ICD-10-CM

## 2012-10-28 DIAGNOSIS — N879 Dysplasia of cervix uteri, unspecified: Secondary | ICD-10-CM

## 2012-10-28 DIAGNOSIS — I2510 Atherosclerotic heart disease of native coronary artery without angina pectoris: Secondary | ICD-10-CM

## 2012-10-28 MED ORDER — LISINOPRIL-HYDROCHLOROTHIAZIDE 20-25 MG PO TABS: 1.0000 | ORAL_TABLET | Freq: Every day | ORAL | Status: DC

## 2012-10-28 MED ORDER — AMLODIPINE BESYLATE 10 MG PO TABS: 10.0000 mg | ORAL_TABLET | Freq: Every day | ORAL | Status: DC

## 2012-10-28 MED ORDER — CYCLOBENZAPRINE HCL 10 MG PO TABS: 10.0000 mg | ORAL_TABLET | Freq: Three times a day (TID) | ORAL | Status: DC | PRN

## 2012-10-28 MED ORDER — SUCRALFATE 1 G PO TABS: 1.0000 g | ORAL_TABLET | Freq: Four times a day (QID) | ORAL | Status: DC

## 2012-10-28 MED ORDER — GABAPENTIN 600 MG PO TABS: 600.0000 mg | ORAL_TABLET | Freq: Three times a day (TID) | ORAL | Status: DC

## 2012-10-28 NOTE — Telephone Encounter (Signed)
Printed rx resent electronically to pharmacy at patient's request. Nicole Mccormick

## 2012-10-28 NOTE — Addendum Note (Signed)
Addended by: Nohemi Nicklaus C on: 10/28/2012 11:44 AM   Modules accepted: Orders

## 2012-10-28 NOTE — Assessment & Plan Note (Signed)
Encouraged to quit. 

## 2012-10-28 NOTE — Progress Notes (Signed)
  Subjective:    Patient ID: Nicole Mccormick, female    DOB: [birth year], 66 y.o.   MRN: [id number]  HPI 66 yo F with hx of HIV/AIDS, previous severe anemia (HgB 5.1). She had a EGD (small hiatal hernia) and colonoscpy (desc polyp tubular adenoma, anorecatl polypoid lesion- condyloma). Also hx of HTN.  Has been pain in her upper throat. Doesn't feel like her food is disolving normally. Has been off meds since yesterday (HIV), off BP meds for 2 weeks. Having difficulty getting rescheduled with IM. Recently seen in ED for n/v, abd pain.   HIV 1 RNA Quant (copies/mL)  Date Value  10/16/2012 <20   03/17/2012 <20   10/12/2011 <20      CD4 T Cell Abs (cmm)  Date Value  10/16/2012 510   03/17/2012 410   10/12/2011 120*      Review of Systems  Constitutional: Negative for appetite change and unexpected weight change.  Gastrointestinal: Positive for abdominal pain. Negative for diarrhea and constipation.  Genitourinary: Negative for difficulty urinating.       Objective:   Physical Exam  Constitutional: She appears well-developed and well-nourished.  HENT:  Mouth/Throat: No oropharyngeal exudate.  Eyes: EOM are normal. Pupils are equal, round, and reactive to light.  Neck: Neck supple.  Cardiovascular: Normal rate, regular rhythm and normal heart sounds.   Pulmonary/Chest: Effort normal and breath sounds normal.  Abdominal: Soft. Bowel sounds are normal. There is no tenderness.  Lymphadenopathy:    She has no cervical adenopathy.  Skin:             Assessment & Plan:  Flexeril, bp meds,

## 2012-10-28 NOTE — Assessment & Plan Note (Signed)
She was seen by CV, asx currently.

## 2012-10-28 NOTE — Assessment & Plan Note (Signed)
She is still symptomatic. Will try to get her repeat eval with Dr Chiquita Loth.

## 2012-10-28 NOTE — Assessment & Plan Note (Signed)
She is doing very well. Offered condoms. Refused. Has gotten flu shot. No problems with ART. Will see her back in 6 months, labs prior

## 2012-10-28 NOTE — Assessment & Plan Note (Signed)
Will refill her meds, get her back in to IM clinic.

## 2012-10-28 NOTE — Telephone Encounter (Signed)
Called patient's daughter Sofie Rower and gave her referral information for [facility].  PCP appt with IM on 12/18/12 at 1:15 pm and GI appt at [facility] for 11/17/12 at 1:00 pm. Wendall Mola CMA

## 2012-10-28 NOTE — Assessment & Plan Note (Signed)
Flexeril refilled. Will increase her neurontin to 600mg  tid.

## 2012-10-28 NOTE — Assessment & Plan Note (Signed)
States that at her previous GYN eval she was told she needed no further f/u

## 2012-11-17 ENCOUNTER — Telehealth: Payer: Self-pay | Admitting: *Deleted

## 2012-11-17 NOTE — Telephone Encounter (Signed)
Patient called to verify her scheduled appointments.

## 2012-12-07 ENCOUNTER — Other Ambulatory Visit: Payer: Self-pay | Admitting: Licensed Clinical Social Worker

## 2012-12-07 ENCOUNTER — Other Ambulatory Visit: Payer: Self-pay | Admitting: Infectious Diseases

## 2012-12-07 DIAGNOSIS — K219 Gastro-esophageal reflux disease without esophagitis: Secondary | ICD-10-CM

## 2012-12-07 DIAGNOSIS — B20 Human immunodeficiency virus [HIV] disease: Secondary | ICD-10-CM

## 2012-12-07 MED ORDER — ESOMEPRAZOLE MAGNESIUM 40 MG PO CPDR: 40.0000 mg | DELAYED_RELEASE_CAPSULE | Freq: Every day | ORAL | Status: DC

## 2012-12-07 MED ORDER — EMTRICITABINE-TENOFOVIR DF 200-300 MG PO TABS: 1.0000 | ORAL_TABLET | Freq: Every day | ORAL | Status: DC

## 2012-12-07 MED ORDER — SUCRALFATE 1 G PO TABS: 1.0000 g | ORAL_TABLET | Freq: Four times a day (QID) | ORAL | Status: DC

## 2012-12-18 ENCOUNTER — Encounter: Payer: PRIVATE HEALTH INSURANCE | Admitting: Internal Medicine

## 2013-01-01 ENCOUNTER — Encounter: Payer: Self-pay | Admitting: Internal Medicine

## 2013-01-01 ENCOUNTER — Ambulatory Visit (INDEPENDENT_AMBULATORY_CARE_PROVIDER_SITE_OTHER): Payer: Medicaid Other | Admitting: Internal Medicine

## 2013-01-01 ENCOUNTER — Ambulatory Visit (HOSPITAL_COMMUNITY)
Admission: RE | Admit: 2013-01-01 | Discharge: 2013-01-01 | Disposition: A | Discharge status: Home / Self Care | Payer: PRIVATE HEALTH INSURANCE | Source: Ambulatory Visit | Attending: Internal Medicine | Admitting: Internal Medicine

## 2013-01-01 VITALS — BP 142/63 | HR 77 | Temp 98.3°F | Ht 62.0 in | Wt 144.1 lb

## 2013-01-01 DIAGNOSIS — F17200 Nicotine dependence, unspecified, uncomplicated: Secondary | ICD-10-CM

## 2013-01-01 DIAGNOSIS — R05 Cough: Secondary | ICD-10-CM

## 2013-01-01 DIAGNOSIS — K219 Gastro-esophageal reflux disease without esophagitis: Secondary | ICD-10-CM

## 2013-01-01 DIAGNOSIS — R059 Cough, unspecified: Secondary | ICD-10-CM

## 2013-01-01 DIAGNOSIS — I700 Atherosclerosis of aorta: Secondary | ICD-10-CM | POA: Insufficient documentation

## 2013-01-01 DIAGNOSIS — B20 Human immunodeficiency virus [HIV] disease: Secondary | ICD-10-CM

## 2013-01-01 DIAGNOSIS — J449 Chronic obstructive pulmonary disease, unspecified: Secondary | ICD-10-CM

## 2013-01-01 DIAGNOSIS — J189 Pneumonia, unspecified organism: Secondary | ICD-10-CM

## 2013-01-01 MED ORDER — LEVOFLOXACIN 750 MG PO TABS: 750.0000 mg | ORAL_TABLET | Freq: Every day | ORAL | Status: AC

## 2013-01-01 MED ORDER — BENZONATATE 100 MG PO CAPS: 100.0000 mg | ORAL_CAPSULE | Freq: Four times a day (QID) | ORAL | Status: DC | PRN

## 2013-01-01 MED ORDER — ESOMEPRAZOLE MAGNESIUM 40 MG PO CPDR: 40.0000 mg | DELAYED_RELEASE_CAPSULE | Freq: Every day | ORAL | Status: DC

## 2013-01-01 MED ORDER — FERROUS SULFATE 325 (65 FE) MG PO TABS: 325.0000 mg | ORAL_TABLET | Freq: Three times a day (TID) | ORAL | Status: DC

## 2013-01-01 NOTE — Patient Instructions (Signed)
General Instructions:  We have given you a medicine called levaquin (levofloxacin). Take 1 pill a day for 7 days. We have also given you a pill for your cough that you can take up to 4 times per day. We would like you to come back in 6 months for follow up or sooner if this cough and breathing do not get better. Our number is [phone].  Treatment Goals:  Goals (1 Years of Data) as of 01/01/2013    None      Progress Toward Treatment Goals:  Treatment Goal 01/01/2013  Blood pressure at goal  Stop smoking smoking the same amount    Self Care Goals & Plans:       Care Management & Community Referrals:

## 2013-01-03 NOTE — Assessment & Plan Note (Signed)
Will resolve and does have resolution of CXR findings as today's CXR was clear.

## 2013-01-03 NOTE — Assessment & Plan Note (Signed)
She continues to take nexium and takes it apart from her HIV medications.

## 2013-01-03 NOTE — Assessment & Plan Note (Signed)
  Assessment:  Progress toward smoking cessation:  smoking the same amount  Barriers to progress toward smoking cessation:  lack of motivation to quit  Comments:   Plan:  Instruction/counseling given:  I advised patient to stop smoking.  Educational resources provided:     Self management tools provided:  smoking cessation plan (STAR Quit Plan)  Medications to assist with smoking cessation:  none Patient agreed to the following self-care plans for smoking cessation:      Other: gave plan and advised cessation.

## 2013-01-03 NOTE — Assessment & Plan Note (Signed)
Well controlled and advised to continue seeing Dr. Ninetta Lights.

## 2013-01-03 NOTE — Progress Notes (Signed)
Subjective:     Patient ID: Nicole Mccormick, female   DOB: [birth year], 67 y.o.   MRN: [id number]  HPI The patient is a 67 year old female who is coming back for followup. She did establish care back in 2012 and has not since returned to clinic. She does have history of HIV which is well-controlled and she follows with infectious disease doctor across the street. She also has history of coronary artery disease, iron deficiency anemia, GERD. She is a current smoker. She does not wish to quit at this time. She states that she is having some viral URI symptoms today; she has had a cough for about one week. She's not really coughing anything up. She states that she's not had any associated fevers, chills, body aches. She has not had any nausea, vomiting, diarrhea. She has been able to maintain her oral intake adequately. She is not having any other complaints at this time although she does state that she has been need for medication refills on or hypertensive medications. She states that she is entirely out of Norvasc which she has not taken in a couple of days. The last time I saw her I did give her prescription for Zostavax and she does not remember she ever got that filled or not.   Review of Systems  Constitutional: Negative for fever, chills, diaphoresis, activity change, appetite change, fatigue and unexpected weight change.  HENT: Positive for congestion.   Eyes: Negative.   Respiratory: Positive for cough. Negative for choking, chest tightness, shortness of breath, wheezing and stridor.   Cardiovascular: Negative for chest pain, palpitations and leg swelling.  Gastrointestinal: Negative.   Musculoskeletal: Negative.  Negative for myalgias, back pain, joint swelling, arthralgias and gait problem.  Skin: Negative.   Neurological: Negative.        Objective:   Physical Exam  Constitutional: She is oriented to person, place, and time. She appears well-developed and well-nourished. No distress.  HENT:    Head: Normocephalic and atraumatic.  Eyes: Conjunctivae normal are normal. Pupils are equal, round, and reactive to light.  Neck: Normal range of motion. Neck supple.  Cardiovascular: Normal rate and regular rhythm.   No murmur heard. Pulmonary/Chest: Effort normal and breath sounds normal. No respiratory distress. She has no wheezes. She has no rales. She exhibits no tenderness.  Abdominal: Soft. Bowel sounds are normal. She exhibits no distension and no mass. There is no tenderness. There is no rebound and no guarding.  Musculoskeletal: Normal range of motion. She exhibits no edema and no tenderness.  Neurological: She is alert and oriented to person, place, and time.  Skin: Skin is warm and dry.  Psychiatric: She has a normal mood and affect.       Assessment/Plan:      1. Please see problem oriented charting.  2. Disposition-the patient be seen back in 6 months or as needed. She did have chest x-ray at today's visit which was negative for signs of pneumonia. She will be treated with course of Levaquin outpatient for presumed bronchitis. She is also Armed forces operational officer for cough. She is advised to call us or return of these symptoms do not alleviate. She also was given information for quitting smoking and is advised strongly to quit now.   Note: Asked patient about allergy to moxifloxacin and she did not recall ever getting it or getting rash or itching so will use levaquin. Advised her if she is having itching to use benadryl and to call us immediately.

## 2013-01-04 ENCOUNTER — Telehealth: Payer: Self-pay | Admitting: *Deleted

## 2013-01-04 MED ORDER — PHENYLEPHRINE-CHLORPHEN-DM 6-2-15 MG/5ML PO SYRP: 5.0000 mL | ORAL_SOLUTION | Freq: Four times a day (QID) | ORAL | Status: DC | PRN

## 2013-01-04 NOTE — Telephone Encounter (Signed)
Pt called stating she was seen on Friday for flu. She states the tessalon perles are are not helping, she is up all night coughing.  She would like to have a syrup. Pt # [id number]

## 2013-01-04 NOTE — Telephone Encounter (Signed)
This is an OTC medication and she can feel free to get it if she wants to try it for cough.

## 2013-01-04 NOTE — Telephone Encounter (Signed)
Pharm calls and states they do not stock the cough syrup prescribed, they suggest robitussin dm, please advise

## 2013-01-04 NOTE — Telephone Encounter (Signed)
Pt informed

## 2013-01-04 NOTE — Telephone Encounter (Signed)
Sent cough syrup in, could you call and let her know.

## 2013-01-12 ENCOUNTER — Encounter: Payer: Self-pay | Admitting: Infectious Diseases

## 2013-01-29 ENCOUNTER — Encounter: Payer: PRIVATE HEALTH INSURANCE | Admitting: Internal Medicine

## 2013-02-19 ENCOUNTER — Encounter: Payer: PRIVATE HEALTH INSURANCE | Admitting: Internal Medicine

## 2013-02-26 ENCOUNTER — Encounter: Payer: Self-pay | Admitting: Internal Medicine

## 2013-02-26 ENCOUNTER — Ambulatory Visit (INDEPENDENT_AMBULATORY_CARE_PROVIDER_SITE_OTHER): Payer: Medicaid Other | Admitting: Internal Medicine

## 2013-02-26 VITALS — BP 130/70 | HR 60 | Temp 98.9°F | Ht 62.0 in | Wt 136.9 lb

## 2013-02-26 DIAGNOSIS — B20 Human immunodeficiency virus [HIV] disease: Secondary | ICD-10-CM

## 2013-02-26 DIAGNOSIS — R911 Solitary pulmonary nodule: Secondary | ICD-10-CM

## 2013-02-26 DIAGNOSIS — I10 Essential (primary) hypertension: Secondary | ICD-10-CM

## 2013-02-26 DIAGNOSIS — R29898 Other symptoms and signs involving the musculoskeletal system: Secondary | ICD-10-CM

## 2013-02-26 DIAGNOSIS — M549 Dorsalgia, unspecified: Secondary | ICD-10-CM

## 2013-02-26 DIAGNOSIS — K219 Gastro-esophageal reflux disease without esophagitis: Secondary | ICD-10-CM

## 2013-02-26 DIAGNOSIS — N879 Dysplasia of cervix uteri, unspecified: Secondary | ICD-10-CM

## 2013-02-26 DIAGNOSIS — Z23 Encounter for immunization: Secondary | ICD-10-CM

## 2013-02-26 MED ORDER — TETANUS-DIPHTH-ACELL PERTUSSIS 5-2.5-18.5 LF-MCG/0.5 IM SUSP: 0.5000 mL | Freq: Once | INTRAMUSCULAR | Status: DC

## 2013-02-26 MED ORDER — GABAPENTIN 600 MG PO TABS: 600.0000 mg | ORAL_TABLET | Freq: Three times a day (TID) | ORAL | Status: DC

## 2013-02-26 MED ORDER — ASPIRIN EC 81 MG PO TBEC: 81.0000 mg | DELAYED_RELEASE_TABLET | Freq: Every day | ORAL | Status: DC

## 2013-02-26 MED ORDER — CYCLOBENZAPRINE HCL 10 MG PO TABS: 10.0000 mg | ORAL_TABLET | Freq: Three times a day (TID) | ORAL | Status: DC | PRN

## 2013-02-26 MED ORDER — NICOTINE 14 MG/24HR TD PT24: 1.0000 | MEDICATED_PATCH | TRANSDERMAL | Status: DC

## 2013-02-26 MED ORDER — ENSURE NUTRA SHAKE HI-CAL PO LIQD: 1.0000 | Freq: Two times a day (BID) | ORAL | Status: DC

## 2013-02-26 MED ORDER — ESOMEPRAZOLE MAGNESIUM 40 MG PO CPDR: 40.0000 mg | DELAYED_RELEASE_CAPSULE | Freq: Every day | ORAL | Status: DC

## 2013-02-26 NOTE — Assessment & Plan Note (Signed)
BP Readings from Last 3 Encounters:  02/26/13 130/70  01/01/13 142/63  10/28/12 165/52    Lab Results  Component Value Date   NA 137 10/18/2012   K 3.1* 10/18/2012   CREATININE 0.71 10/18/2012    Assessment:  Blood pressure control: controlled  Progress toward BP goal:  at goal  Comments:   Plan:  Medications:  continue current medications, amlodipine 10 mg daily, lisinopril/HCTZ 20/25 mg daily  Educational resources provided:    Self management tools provided: instructions for home blood pressure monitoring  Other plans:

## 2013-02-26 NOTE — Progress Notes (Signed)
Subjective:     Patient ID: Nicole Mccormick, female   DOB: 08-Jun-1946, 67 y.o.   MRN: [id number]  HPI The patient is a 67 year old female who comes in today wanting a motorized wheelchair. She does have past medical history of HIV, well controlled, GERD, back and leg pain, hypertension. She states that she has fallen several times at home due to her instability of her legs. She states that her legs don't often hurt her to cause her to fall however she feels they are week. She states that she does not do very much activity and doesn't go long distances. She doesn't feel as if the legs often get out on her but she does feel as if her stamina is very low. She's not having any fevers or chills at home she's not having any cough or chest pain. She is not having shortness of breath. Upon discussing with her she is amenable to the idea of trialing a wheelchair and doing some physical therapy to get her strength back as opposed to going to a wheelchair. She does need refills on several medications today and states that she has been taking her medications regularly. She does not have any other questions or requests at today's visit.  Review of Systems  Constitutional: Negative for fever, chills, diaphoresis, activity change, appetite change, fatigue and unexpected weight change.  HENT: Negative for congestion.   Eyes: Negative.   Respiratory: Negative for cough, choking, chest tightness, shortness of breath, wheezing and stridor.   Cardiovascular: Negative for chest pain, palpitations and leg swelling.  Gastrointestinal: Negative.   Musculoskeletal: Positive for back pain and gait problem. Negative for myalgias, joint swelling and arthralgias.       Some instability with walking, currently uses cane, still having occasional falls due to weakness.  Skin: Negative.   Neurological: Positive for weakness. Negative for dizziness, tremors, seizures, syncope, facial asymmetry, speech difficulty, light-headedness, numbness  and headaches.       Objective:   Physical Exam  Constitutional: She is oriented to person, place, and time. She appears well-developed and well-nourished. No distress.  HENT:  Head: Normocephalic and atraumatic.  Eyes: Conjunctivae are normal. Pupils are equal, round, and reactive to light.  Neck: Normal range of motion. Neck supple.  Cardiovascular: Normal rate and regular rhythm.   No murmur heard. Pulmonary/Chest: Effort normal and breath sounds normal. No respiratory distress. She has no wheezes. She has no rales. She exhibits no tenderness.  Abdominal: Soft. Bowel sounds are normal. She exhibits no distension and no mass. There is no tenderness. There is no rebound and no guarding.  Musculoskeletal: Normal range of motion. She exhibits no edema and no tenderness.  Walks short distances with cane and is fairly stable.  Neurological: She is alert and oriented to person, place, and time.  Skin: Skin is warm and dry.  Psychiatric: She has a normal mood and affect.       Assessment/Plan:   1. Request for motorized wheelchair-the patient was declined. She will trial wheeled walker with seat so that her stability can be helped out. She will also have a chance to rest of her legs feel weak. We will also send her for physical therapy and leg strengthening along with gait training.  2. Please see problem oriented charting.  3. Disposition-the patient was given prescription for wheeled walker with seat, physical therapy, she was given Tdap at today's visit. Upon chart review it would appear that the patient did have CT chest back in  2012 showing lung nodule which was to be followed up in one year. However as the patient was not in healthcare system in following up regularly at that time she did not receive followup for that. Review of her last creatinine indicates she is able to have CT chest with contrast and did talk to her about this nodule and she will have repeat scan. She is high risk  as she has history of multiple years of smoking.

## 2013-02-26 NOTE — Assessment & Plan Note (Signed)
Does currently follow with [facility]. Reviewing recommendations it seems that although she's had hysterectomy since she had history of dysplasia and currently has HIV she should be getting yearly Pap smears.

## 2013-02-26 NOTE — Addendum Note (Signed)
Addended by: Angelina Ok F on: 02/26/2013 04:40 PM   Modules accepted: Orders

## 2013-02-26 NOTE — Patient Instructions (Addendum)
We will see you back in 6 months or as needed if you are feeling sick.   We will send you for some physical therapy and give you a wheeled walker with a seat to help with your stability and strength.   We will order a test of your chest and they will call you with the time for that.   We will send refills for your medicines today.  Call us if you need [place] at [phone].

## 2013-02-26 NOTE — Assessment & Plan Note (Addendum)
Patient claims that she does not often have back pain however she takes Flexeril 3 times daily and gabapentin 3 times daily every single day whether she feels any difference in pain at all. She states that if she misses a dose she thinks she may feel worse. Upon review of CT scan to see if she has any noted back degeneration of a CT chest did find that she had a lung nodule back in 2012 which was never followed up. We'll followup with CT chest today. There was no noted stenosis or spinal cord impingement on the CTs chest back in 2012.

## 2013-03-16 ENCOUNTER — Encounter: Payer: Self-pay | Admitting: *Deleted

## 2013-03-16 ENCOUNTER — Encounter: Payer: Self-pay | Admitting: Internal Medicine

## 2013-03-16 DIAGNOSIS — R269 Unspecified abnormalities of gait and mobility: Secondary | ICD-10-CM | POA: Insufficient documentation

## 2013-03-19 ENCOUNTER — Other Ambulatory Visit: Payer: Self-pay | Admitting: Internal Medicine

## 2013-03-19 DIAGNOSIS — R911 Solitary pulmonary nodule: Secondary | ICD-10-CM

## 2013-03-23 ENCOUNTER — Ambulatory Visit (HOSPITAL_COMMUNITY)
Admission: RE | Admit: 2013-03-23 | Discharge: 2013-03-23 | Disposition: A | Discharge status: Home / Self Care | Payer: PRIVATE HEALTH INSURANCE | Source: Ambulatory Visit | Attending: Internal Medicine | Admitting: Internal Medicine

## 2013-03-23 ENCOUNTER — Other Ambulatory Visit: Payer: PRIVATE HEALTH INSURANCE

## 2013-03-23 ENCOUNTER — Encounter (HOSPITAL_COMMUNITY): Payer: Self-pay

## 2013-03-23 DIAGNOSIS — I517 Cardiomegaly: Secondary | ICD-10-CM | POA: Insufficient documentation

## 2013-03-23 DIAGNOSIS — I2510 Atherosclerotic heart disease of native coronary artery without angina pectoris: Secondary | ICD-10-CM | POA: Insufficient documentation

## 2013-03-23 DIAGNOSIS — J438 Other emphysema: Secondary | ICD-10-CM | POA: Insufficient documentation

## 2013-03-23 DIAGNOSIS — R911 Solitary pulmonary nodule: Secondary | ICD-10-CM

## 2013-03-23 DIAGNOSIS — F17200 Nicotine dependence, unspecified, uncomplicated: Secondary | ICD-10-CM | POA: Insufficient documentation

## 2013-03-23 DIAGNOSIS — I700 Atherosclerosis of aorta: Secondary | ICD-10-CM | POA: Insufficient documentation

## 2013-03-23 DIAGNOSIS — Z21 Asymptomatic human immunodeficiency virus [HIV] infection status: Secondary | ICD-10-CM | POA: Insufficient documentation

## 2013-03-23 LAB — BASIC METABOLIC PANEL WITH GFR
BUN: 22 mg/dL (ref 6–23)
CO2: 28 mEq/L (ref 19–32)
Calcium: 9 mg/dL (ref 8.4–10.5)
Chloride: 104 mEq/L (ref 96–112)
Creat: 0.76 mg/dL (ref 0.50–1.10)
GFR, Est African American: >89 mL/min
GFR, Est Non African American: 81 mL/min
Glucose, Bld: 111 mg/dL — ABNORMAL HIGH (ref 70–99)
Potassium: 3.2 mEq/L — ABNORMAL LOW (ref 3.5–5.3)
Sodium: 139 mEq/L (ref 135–145)

## 2013-03-23 MED ORDER — IOHEXOL 300 MG/ML  SOLN
80.0000 mL | Freq: Once | INTRAMUSCULAR | Status: AC | PRN
  Administered 2013-03-23: 80 mL via INTRAVENOUS

## 2013-04-05 NOTE — Addendum Note (Signed)
Addended by: Neomia Dear on: 04/05/2013 05:46 PM   Modules accepted: Orders

## 2013-04-07 ENCOUNTER — Other Ambulatory Visit: Payer: Self-pay | Admitting: Infectious Diseases

## 2013-04-21 ENCOUNTER — Other Ambulatory Visit: Payer: Self-pay | Admitting: Internal Medicine

## 2013-04-21 ENCOUNTER — Other Ambulatory Visit: Payer: Self-pay

## 2013-04-21 DIAGNOSIS — Z1231 Encounter for screening mammogram for malignant neoplasm of breast: Secondary | ICD-10-CM

## 2013-04-23 ENCOUNTER — Encounter: Payer: Self-pay | Admitting: Internal Medicine

## 2013-04-27 ENCOUNTER — Ambulatory Visit
Payer: PRIVATE HEALTH INSURANCE | Attending: Internal Medicine | Admitting: Rehabilitative and Restorative Service Providers"

## 2013-04-28 ENCOUNTER — Other Ambulatory Visit: Payer: PRIVATE HEALTH INSURANCE

## 2013-05-12 ENCOUNTER — Ambulatory Visit: Payer: PRIVATE HEALTH INSURANCE | Admitting: Infectious Diseases

## 2013-05-12 ENCOUNTER — Telehealth: Payer: Self-pay | Admitting: *Deleted

## 2013-05-12 NOTE — Telephone Encounter (Signed)
Left message for patient to call the office to reschedule her lab and MD appt, she no showed. Wendall Mola

## 2013-05-19 ENCOUNTER — Ambulatory Visit: Payer: PRIVATE HEALTH INSURANCE | Admitting: Infectious Diseases

## 2013-05-25 ENCOUNTER — Ambulatory Visit: Payer: PRIVATE HEALTH INSURANCE

## 2013-06-04 ENCOUNTER — Encounter: Payer: PRIVATE HEALTH INSURANCE | Admitting: Internal Medicine

## 2013-06-21 ENCOUNTER — Other Ambulatory Visit: Payer: Self-pay | Admitting: Infectious Diseases

## 2013-06-28 ENCOUNTER — Other Ambulatory Visit: Payer: PRIVATE HEALTH INSURANCE | Admitting: Internal Medicine

## 2013-06-28 DIAGNOSIS — I10 Essential (primary) hypertension: Secondary | ICD-10-CM

## 2013-06-28 DIAGNOSIS — B20 Human immunodeficiency virus [HIV] disease: Secondary | ICD-10-CM

## 2013-06-28 DIAGNOSIS — Z79899 Other long term (current) drug therapy: Secondary | ICD-10-CM

## 2013-06-28 LAB — LIPID PANEL
Cholesterol: 144 mg/dL (ref 0–200)
HDL: 65 mg/dL (ref >39)
LDL Cholesterol: 69 mg/dL (ref 0–99)
Total CHOL/HDL Ratio: 2.2 Ratio
Triglycerides: 52 mg/dL (ref <150)
VLDL: 10 mg/dL (ref 0–40)

## 2013-06-28 LAB — COMPREHENSIVE METABOLIC PANEL
ALT: 33 U/L (ref 0–35)
AST: 34 U/L (ref 0–37)
Albumin: 4 g/dL (ref 3.5–5.2)
Alkaline Phosphatase: 72 U/L (ref 39–117)
BUN: 24 mg/dL — ABNORMAL HIGH (ref 6–23)
CO2: 28 mEq/L (ref 19–32)
Calcium: 10 mg/dL (ref 8.4–10.5)
Chloride: 104 mEq/L (ref 96–112)
Creat: 0.64 mg/dL (ref 0.50–1.10)
Glucose, Bld: 113 mg/dL — ABNORMAL HIGH (ref 70–99)
Potassium: 4.8 mEq/L (ref 3.5–5.3)
Sodium: 139 mEq/L (ref 135–145)
Total Bilirubin: 0.5 mg/dL (ref 0.3–1.2)
Total Protein: 8.4 g/dL — ABNORMAL HIGH (ref 6.0–8.3)

## 2013-06-28 LAB — CBC
HCT: 34.8 % — ABNORMAL LOW (ref 36.0–46.0)
Hemoglobin: 11.5 g/dL — ABNORMAL LOW (ref 12.0–15.0)
MCH: 31.5 pg (ref 26.0–34.0)
MCHC: 33 g/dL (ref 30.0–36.0)
MCV: 95.3 fL (ref 78.0–100.0)
Platelets: 290 10*3/uL (ref 150–400)
RBC: 3.65 MIL/uL — ABNORMAL LOW (ref 3.87–5.11)
RDW: 16.8 % — ABNORMAL HIGH (ref 11.5–15.5)
WBC: 4.8 10*3/uL (ref 4.0–10.5)

## 2013-06-28 MED ORDER — ATAZANAVIR SULFATE 200 MG PO CAPS: ORAL_CAPSULE | ORAL | Status: DC

## 2013-06-28 MED ORDER — EMTRICITABINE-TENOFOVIR DF 200-300 MG PO TABS: 1.0000 | ORAL_TABLET | Freq: Every day | ORAL | Status: DC

## 2013-06-28 MED ORDER — LISINOPRIL-HYDROCHLOROTHIAZIDE 20-25 MG PO TABS: ORAL_TABLET | ORAL | Status: DC

## 2013-06-29 LAB — T-HELPER CELL (CD4) - (RCID CLINIC ONLY)
CD4 % Helper T Cell: 24 % — ABNORMAL LOW (ref 33–55)
CD4 T Cell Abs: 460 uL (ref 400–2700)

## 2013-06-29 LAB — HIV-1 RNA QUANT-NO REFLEX-BLD
HIV 1 RNA Quant: <20 copies/mL (ref <20)
HIV-1 RNA Quant, Log: <1.3 {Log} (ref <1.30)

## 2013-07-12 ENCOUNTER — Ambulatory Visit (HOSPITAL_COMMUNITY)
Admission: RE | Admit: 2013-07-12 | Discharge: 2013-07-12 | Disposition: A | Discharge status: Home / Self Care | Payer: PRIVATE HEALTH INSURANCE | Source: Ambulatory Visit | Attending: Infectious Diseases | Admitting: Infectious Diseases

## 2013-07-12 ENCOUNTER — Ambulatory Visit (INDEPENDENT_AMBULATORY_CARE_PROVIDER_SITE_OTHER): Payer: PRIVATE HEALTH INSURANCE | Admitting: Infectious Diseases

## 2013-07-12 ENCOUNTER — Other Ambulatory Visit: Payer: Self-pay

## 2013-07-12 ENCOUNTER — Encounter: Payer: Self-pay | Admitting: Infectious Diseases

## 2013-07-12 ENCOUNTER — Other Ambulatory Visit: Payer: Self-pay | Admitting: Infectious Diseases

## 2013-07-12 VITALS — BP 163/70 | HR 56 | Temp 98.1°F | Ht 62.0 in | Wt 133.0 lb

## 2013-07-12 DIAGNOSIS — R079 Chest pain, unspecified: Secondary | ICD-10-CM

## 2013-07-12 DIAGNOSIS — I10 Essential (primary) hypertension: Secondary | ICD-10-CM

## 2013-07-12 DIAGNOSIS — K029 Dental caries, unspecified: Secondary | ICD-10-CM

## 2013-07-12 DIAGNOSIS — R0789 Other chest pain: Secondary | ICD-10-CM | POA: Insufficient documentation

## 2013-07-12 DIAGNOSIS — R87622 Low grade squamous intraepithelial lesion on cytologic smear of vagina (LGSIL): Secondary | ICD-10-CM

## 2013-07-12 DIAGNOSIS — B20 Human immunodeficiency virus [HIV] disease: Secondary | ICD-10-CM

## 2013-07-12 DIAGNOSIS — I2510 Atherosclerotic heart disease of native coronary artery without angina pectoris: Secondary | ICD-10-CM

## 2013-07-12 DIAGNOSIS — I498 Other specified cardiac arrhythmias: Secondary | ICD-10-CM | POA: Insufficient documentation

## 2013-07-12 NOTE — Assessment & Plan Note (Signed)
Her ECG does not show ST-T changes. Will have her seen by CV with her hx of MI, ASAP

## 2013-07-12 NOTE — Assessment & Plan Note (Signed)
Will refer her back to dental.

## 2013-07-12 NOTE — Progress Notes (Signed)
  Subjective:    Patient ID: Nicole Mccormick, female    DOB: Jan 10, 1946, 67 y.o.   MRN: [id number]  HPI 67 yo F with hx of HIV/AIDS, previous severe anemia (HgB 5.1). She had a EGD (small hiatal hernia) and colonoscpy (desc polyp tubular adenoma, anorecatl polypoid lesion- condyloma). Also hx of HTN.  C/o CP- has had for 3 weeks. Feels sharp and stabbing. Has spasms around her chest. Improves somewhat with taking deep breath, sitting up. Ha diaphoresis. Radiates into L arm to biceps. No gas. Occas had bad taste in her mouth. States she had MI 2012 in [place].  Having L thigh pain as well.  Medications are going well. Having weird dreams that she is ambulating around room- attributes to nicotene patch.  Wants dental eval.  Wants eye exam also- she will arrange.   HIV 1 RNA Quant (copies/mL)  Date Value  06/28/2013 <20   10/16/2012 <20   03/17/2012 <20      CD4 T Cell Abs (cmm)  Date Value  06/28/2013 460   10/16/2012 510   03/17/2012 410     Is out of one her HTN rx's.   Review of Systems  Constitutional: Negative for appetite change and unexpected weight change.  Respiratory: Negative for shortness of breath.   Cardiovascular: Positive for chest pain.  Gastrointestinal: Negative for diarrhea and constipation.  Genitourinary: Negative for difficulty urinating.  Neurological: Positive for headaches.       Objective:   Physical Exam  Constitutional: She appears well-developed and well-nourished.  HENT:  Mouth/Throat: No oropharyngeal exudate.  Eyes: EOM are normal. Pupils are equal, round, and reactive to light.  Neck: Neck supple.  Cardiovascular: Normal rate, regular rhythm and normal heart sounds.   Pulmonary/Chest: Effort normal and breath sounds normal. She exhibits no tenderness.  Abdominal: Soft. Bowel sounds are normal. She exhibits no distension.  Musculoskeletal: She exhibits no edema.  Lymphadenopathy:    She has no cervical adenopathy.          Assessment & Plan:

## 2013-07-12 NOTE — Assessment & Plan Note (Signed)
Get her in for repeat PAP

## 2013-07-12 NOTE — Assessment & Plan Note (Signed)
Will refill her rx's  

## 2013-07-12 NOTE — Assessment & Plan Note (Signed)
Doing well. Will continue her current meds. Will see her back in 6 months.

## 2013-07-23 ENCOUNTER — Ambulatory Visit (INDEPENDENT_AMBULATORY_CARE_PROVIDER_SITE_OTHER): Payer: PRIVATE HEALTH INSURANCE | Admitting: Internal Medicine

## 2013-07-23 ENCOUNTER — Encounter: Payer: Self-pay | Admitting: Internal Medicine

## 2013-07-23 VITALS — BP 157/79 | HR 64 | Temp 97.5°F | Ht 63.0 in | Wt 136.1 lb

## 2013-07-23 DIAGNOSIS — M549 Dorsalgia, unspecified: Secondary | ICD-10-CM

## 2013-07-23 DIAGNOSIS — B20 Human immunodeficiency virus [HIV] disease: Secondary | ICD-10-CM

## 2013-07-23 DIAGNOSIS — R269 Unspecified abnormalities of gait and mobility: Secondary | ICD-10-CM

## 2013-07-23 DIAGNOSIS — F17200 Nicotine dependence, unspecified, uncomplicated: Secondary | ICD-10-CM

## 2013-07-23 DIAGNOSIS — I10 Essential (primary) hypertension: Secondary | ICD-10-CM

## 2013-07-23 MED ORDER — ZOSTER VACCINE LIVE 19400 UNT/0.65ML ~~LOC~~ SOLR: 0.6500 mL | Freq: Once | SUBCUTANEOUS | Status: DC

## 2013-07-23 MED ORDER — LISINOPRIL-HYDROCHLOROTHIAZIDE 20-25 MG PO TABS: ORAL_TABLET | ORAL | Status: DC

## 2013-07-23 MED ORDER — PAROXETINE HCL 20 MG PO TABS: 20.0000 mg | ORAL_TABLET | Freq: Every day | ORAL | Status: DC

## 2013-07-23 MED ORDER — CYCLOBENZAPRINE HCL 10 MG PO TABS: 10.0000 mg | ORAL_TABLET | Freq: Three times a day (TID) | ORAL | Status: DC | PRN

## 2013-07-23 MED ORDER — GABAPENTIN 600 MG PO TABS: 600.0000 mg | ORAL_TABLET | Freq: Three times a day (TID) | ORAL | Status: DC

## 2013-07-23 NOTE — Patient Instructions (Signed)
You can take flexeril up to 3 times per day for muscle problems and we have refilled it and the gabapentin. We have also refilled your blood pressure medicines. You can stop taking iron every day.   We would like you to try to take paxil, which is a medicine to help with mood and sleep. Take 1 pill per day and you should notice the results in the first 4-6 weeks. If you notice your mood worsening or thoughts of hurting yourself or others please call our office immediately or seek medical attention.   Our number is [phone] and we would like you to come back in 3-6 months. If your mood is doing bad please come back sooner.   We will give you the shot for the shingles and you can call around to your pharmacy to see how much it will cost.  Depression You have signs of depression. This is a common problem. It can occur at any age. It is often hard to recognize. People can suffer from depression and still have moments of enjoyment. Depression interferes with your basic ability to function in life. It upsets your relationships, sleep, eating, and work habits. CAUSES  Depression is believed to be caused by an imbalance in brain chemicals. It may be triggered by an unpleasant event. Relationship crises, a death in the family, financial worries, retirement, or other stressors are normal causes of depression. Depression may also start for no known reason. Other factors that may play a part include medical illnesses, some medicines, genetics, and alcohol or drug abuse. SYMPTOMS   Feeling unhappy or worthless.   Long-lasting (chronic) tiredness or worn-out feeling.   Self-destructive thoughts and actions.   Not being able to sleep or sleeping too much.   Eating more than usual or not eating at all.   Headaches or feeling anxious.   Trouble concentrating or making decisions.   Unexplained physical problems and substance abuse.  TREATMENT  Depression usually gets better with treatment. This can  include:  Antidepressant medicines. It can take weeks before the proper dose is achieved and benefits are reached.   Talking with a therapist, clergyperson, counselor, or friend. These people can help you gain insight into your problem and regain control of your life.   Eating a good diet.   Getting regular physical exercise, such as walking for 30 minutes every day.   Not abusing alcohol or drugs.  Treating depression often takes 6 months or longer. This length of treatment is needed to keep symptoms from returning. Call your caregiver and arrange for follow-up care as suggested. SEEK IMMEDIATE MEDICAL CARE IF:   You start to have thoughts of hurting yourself or others.   Call your local emergency services (911 in [place]   Go to your local medical emergency department.   Call the National Suicide Prevention Lifeline: 1-800-273-TALK (936)645-8827).  Document Released: 11/25/2005 Document Revised: 11/14/2011 Document Reviewed: 04/27/2010 [facility] Patient Information 2012 [place], [place].

## 2013-07-24 NOTE — Assessment & Plan Note (Signed)
Fall Screening 10/28/2012 02/26/2013 07/12/2013 07/23/2013  Falls in the past year? No Yes No Yes  Number of falls in past year - 2 or more - 2 or more      Assessment: Progress toward fall prevention goal:  at goal Comments: she is doing better at using the walker at home and is steadier outside the home with exercise   Plan: Fall prevention plans: continue current plan of walker and using aids for better balance Educational resources provided: brochure Self management tools provided:

## 2013-07-24 NOTE — Progress Notes (Signed)
Subjective:     Patient ID: Nicole Mccormick, female   DOB: [birth year], 67 y.o.   MRN: [id number]  HPI The patient is a 67 YO female who returns to the clinic for a routine follow up visit. She has PMH of HIV (last CD4 460 with viral load undetected), HTN, tobacco abuse, multiple falls, GERD. She states that she is having some fatigue and some sharp pains in her left side which she states are similar to pains before she had that she used gabapentin successfully to treat. Her HIV doctor has referred her to a cardiologist for these pains. She does not notice but thinks that they last about 1 second and come at rest or motion and are not worsened by anything or helped by anything. She is not having any chest pains or SOB. She is not having fevers or chills. She is not sleeping well and has poor appetite and some feelings of anxiety and stress hanging over her head. She does not have any SI/HI. She denied any falls since last visit.   Review of Systems  Constitutional: Negative for fever, chills, diaphoresis, activity change, appetite change, fatigue and unexpected weight change.  HENT: Negative for congestion.   Eyes: Negative.   Respiratory: Negative for cough, choking, chest tightness, shortness of breath, wheezing and stridor.   Cardiovascular: Negative for chest pain, palpitations and leg swelling.  Gastrointestinal: Negative.   Musculoskeletal: Positive for back pain and gait problem. Negative for myalgias, joint swelling and arthralgias.       Some instability with walking, no cane with her but states that she uses a walker in the house.  Skin: Negative.   Neurological: Positive for weakness. Negative for dizziness, tremors, seizures, syncope, facial asymmetry, speech difficulty, light-headedness, numbness and headaches.       Objective:   Physical Exam  Constitutional: She is oriented to person, place, and time. She appears well-developed and well-nourished. No distress.  HENT:  Head:  Normocephalic and atraumatic.  Eyes: Conjunctivae are normal. Pupils are equal, round, and reactive to light.  Neck: Normal range of motion. Neck supple.  Cardiovascular: Normal rate and regular rhythm.   No murmur heard. Pulmonary/Chest: Effort normal and breath sounds normal. No respiratory distress. She has no wheezes. She has no rales. She exhibits no tenderness.  Abdominal: Soft. Bowel sounds are normal. She exhibits no distension and no mass. There is no tenderness. There is no rebound and no guarding.  Musculoskeletal: Normal range of motion. She exhibits no edema and no tenderness.  Walks short distances unassisted and is fairly stable.  Neurological: She is alert and oriented to person, place, and time.  Skin: Skin is warm and dry.  Psychiatric: She has a normal mood and affect.       Assessment/Plan:   1. Please see problem oriented charting.  2. Disposition - Refill provided for lisinopril/HCTZ, gabapentin, flexeril. Given zostavax rx and advised to get the shingles shot. Will trial paxil for mood improvement and she will come back in 3-6 months. If her mood is improved with paxil she can continue taking it and return in 6 months. If her mood declines or she is not feeling better she is advised to return sooner than 6 months.

## 2013-07-24 NOTE — Assessment & Plan Note (Signed)
  Assessment: Progress toward smoking cessation:    Barriers to progress toward smoking cessation:    Comments: she does not wish to quit and uses tobacco to deal with stress  Plan: Instruction/counseling given:  I counseled patient on the dangers of tobacco use, advised patient to stop smoking, and reviewed strategies to maximize success. Educational resources provided:  [employer]) brochure Self management tools provided:    Medications to assist with smoking cessation:  None Patient agreed to the following self-care plans for smoking cessation:    Other plans:

## 2013-07-24 NOTE — Assessment & Plan Note (Signed)
BP Readings from Last 3 Encounters:  07/23/13 157/79  07/12/13 163/70  02/26/13 130/70    Lab Results  Component Value Date   NA 139 06/28/2013   K 4.8 06/28/2013   CREATININE 0.64 06/28/2013    Assessment: Blood pressure control: mildly elevated Progress toward BP goal:  unchanged Comments: she was controlled at last visit in March on meds and is currently off medications and will advise her to restart and provide new rx  Plan: Medications:  continue current medications, amlodipine 10 mg daily, lisinopril/HCTZ 20/25 mg Educational resources provided: brochure;handout Self management tools provided: instructions for home blood pressure monitoring Other plans:

## 2013-07-24 NOTE — Assessment & Plan Note (Signed)
Continue reyataz and truvada. Viral load undetected and last CD4 count 460. Takes 12 hours apart from nexium.

## 2013-07-27 NOTE — Progress Notes (Signed)
Case discussed with Dr. Kollar soon after the resident saw the patient.  We reviewed the resident's history and exam and pertinent patient test results.  I agree with the assessment, diagnosis, and plan of care documented in the resident's note. 

## 2013-08-16 ENCOUNTER — Other Ambulatory Visit: Payer: Self-pay | Admitting: *Deleted

## 2013-08-16 ENCOUNTER — Ambulatory Visit: Payer: PRIVATE HEALTH INSURANCE | Admitting: Internal Medicine

## 2013-08-16 DIAGNOSIS — B20 Human immunodeficiency virus [HIV] disease: Secondary | ICD-10-CM

## 2013-08-16 MED ORDER — ENSURE NUTRA SHAKE HI-CAL PO LIQD: 1.0000 | Freq: Two times a day (BID) | ORAL | Status: DC

## 2013-08-16 NOTE — Telephone Encounter (Signed)
Appt given to pt for 08/17/13 3:15PM Dr Mikey Bussing - has growth ext right vag past 4 days - size of pencil eraser. Soaking in warm water. Unable to come today. Stanton Kidney Inara Dike RN 08/16/13 9:15AM

## 2013-08-16 NOTE — Telephone Encounter (Signed)
[facility] can not accept Rx - must be ID doctors. Message left on pt recording to call ID group.

## 2013-08-17 ENCOUNTER — Ambulatory Visit (INDEPENDENT_AMBULATORY_CARE_PROVIDER_SITE_OTHER): Payer: PRIVATE HEALTH INSURANCE | Admitting: Internal Medicine

## 2013-08-17 ENCOUNTER — Encounter: Payer: Self-pay | Admitting: Internal Medicine

## 2013-08-17 VITALS — BP 177/84 | HR 63 | Temp 98.1°F | Ht 62.5 in | Wt 133.9 lb

## 2013-08-17 DIAGNOSIS — Z299 Encounter for prophylactic measures, unspecified: Secondary | ICD-10-CM

## 2013-08-17 DIAGNOSIS — Z23 Encounter for immunization: Secondary | ICD-10-CM

## 2013-08-17 DIAGNOSIS — N9489 Other specified conditions associated with female genital organs and menstrual cycle: Secondary | ICD-10-CM

## 2013-08-17 DIAGNOSIS — I10 Essential (primary) hypertension: Secondary | ICD-10-CM

## 2013-08-17 DIAGNOSIS — N949 Unspecified condition associated with female genital organs and menstrual cycle: Secondary | ICD-10-CM | POA: Insufficient documentation

## 2013-08-17 MED ORDER — ACYCLOVIR 400 MG PO TABS: 400.0000 mg | ORAL_TABLET | Freq: Three times a day (TID) | ORAL | Status: AC

## 2013-08-17 MED ORDER — LISINOPRIL 20 MG PO TABS: 20.0000 mg | ORAL_TABLET | Freq: Every day | ORAL | Status: DC

## 2013-08-17 NOTE — Patient Instructions (Addendum)
1.  Take 400mg  of Acyclovir for 10 days. 2.  Start taking lisinopril 20mg  every night. 2.  Keep taking your regular medications and we will see you back in 2-4 weeks for a recheck of your BP.  And follow up lab results.

## 2013-08-17 NOTE — Assessment & Plan Note (Addendum)
Patient does have history of HIV but is currently well controlled with undetectable viral load, she denies any history of herpes, or other lesions. She reports her last sexual activity was 3 weeks ago, and is only sexually active with her husband. Lesion appears to be slightly elevated but not overly vesicular. Herpes culture sent. Will treat empirically with acyclovir 400 mg 3 times a day for 10 days. Patient is to followup in about 2 weeks for recheck blood pressure, and we can reevaluate symptoms at that time.

## 2013-08-17 NOTE — Assessment & Plan Note (Signed)
BP Readings from Last 3 Encounters:  08/17/13 177/84  07/23/13 157/79  07/12/13 163/70    Lab Results  Component Value Date   NA 139 06/28/2013   K 4.8 06/28/2013   CREATININE 0.64 06/28/2013    Assessment: Blood pressure control: moderately elevated Progress toward BP goal:  deteriorated Comments: Remains elevated  Plan: Medications: amlodipine 10mg  daily, Lisinopril HCTZ-20-25, adding Lisinopril 20mg  daily today. Educational resources provided: brochure Self management tools provided: home blood pressure logbook Other plans: Increasing lisinopril by 20mg  will have patient follow up soon.

## 2013-08-17 NOTE — Progress Notes (Signed)
Patient ID: Nicole Mccormick, female   DOB: 1946/08/12, 67 y.o.   MRN: [id number]   Subjective:   Patient ID: Nicole Mccormick female   DOB: 08-29-1946 67 y.o.   MRN: [id number]  HPI: [name] is a 67 y.o. female with past medical history of HIV 06/28/13 CD4 460 viral load undetectable, severe hypertension, CAD. She presents to chief complaint of a burning bump on the right labia majora. She reports that she noticed about first last Friday, she reports that the size is about the tip of her pinky finger, she reports painful after she urinates that she must dry the area with toilet paper, she reports improvement in the pain with warm baths, and using petroleum jelly over the area. She denies any systemic symptoms including no fever, chills, abdominal pain, or dysuria.     Past Medical History  Diagnosis Date  . Hypertension   . Abnormal Pap smear   . Asthma   . Coronary artery disease   . HIV infection   . Varicosities   . Seizures     last sz fri nov 2  . GERD (gastroesophageal reflux disease)     previously on aciphex, discontinued november 2012  because patient asymptomatic, and concern about interference with HIV meds.  May try pepcid in the future if symptoms return  . Iron deficiency anemia     Ferritin = 2 in november 2012, started on iron supplemenation  . Arthritis   . CHF (congestive heart failure)   . Heart murmur    Current Outpatient Prescriptions  Medication Sig Dispense Refill  . acyclovir (ZOVIRAX) 400 MG tablet Take 1 tablet (400 mg total) by mouth 3 (three) times daily.  30 tablet  0  . amLODipine (NORVASC) 10 MG tablet Take 1 tablet (10 mg total) by mouth daily.  30 tablet  11  . aspirin EC 81 MG tablet Take 1 tablet (81 mg total) by mouth daily.  30 tablet  6  . atazanavir (REYATAZ) 200 MG capsule take 2 capsules by mouth once daily **SPACED 12 HOURS FROM ANTACIDS**  60 capsule  6  . cyclobenzaprine (FLEXERIL) 10 MG tablet Take 1 tablet (10 mg total) by mouth 3 (three)  times daily as needed. For muscle spasms  60 tablet  3  . emtricitabine-tenofovir (TRUVADA) 200-300 MG per tablet Take 1 tablet by mouth daily.  30 tablet  6  . esomeprazole (NEXIUM) 40 MG capsule Take 1 capsule (40 mg total) by mouth daily.  30 capsule  6  . gabapentin (NEURONTIN) 600 MG tablet Take 1 tablet (600 mg total) by mouth 3 (three) times daily.  90 tablet  3  . lisinopril (PRINIVIL,ZESTRIL) 20 MG tablet Take 1 tablet (20 mg total) by mouth daily.  30 tablet  11  . lisinopril-hydrochlorothiazide (PRINZIDE,ZESTORETIC) 20-25 MG per tablet take 1 tablet by mouth once daily  30 tablet  6  . Nutritional Supplements (ENSURE NUTRA SHAKE HI-CAL) LIQD Take 1 Can by mouth 2 (two) times daily.  60 Can  6  . PARoxetine (PAXIL) 20 MG tablet Take 1 tablet (20 mg total) by mouth daily.  30 tablet  2  . sucralfate (CARAFATE) 1 G tablet Take 1 tablet (1 g total) by mouth 4 (four) times daily.  120 tablet  0  . zoster vaccine live, PF, (ZOSTAVAX) 86578 UNT/0.65ML injection Inject 19,400 Units into the skin once.  1 each  0   No current facility-administered medications for this visit.   Family History  Problem Relation Age of Onset  . Diabetes Mother   . Ovarian cancer Sister   . Cancer Sister     ovarian and colon  . Colon cancer Neg Hx    History   Social History  . Marital Status: Single    Spouse Name: N/A    Number of Children: N/A  . Years of Education: N/A   Social History Main Topics  . Smoking status: Current Every Day Smoker -- 0.25 packs/day for 50 years    Types: Cigarettes  . Smokeless tobacco: Never Used  . Alcohol Use: 1.2 oz/week    2 Cans of beer per week     Comment: occasional  . Drug Use: No  . Sexual Activity: Yes    Partners: Male    Birth Control/ Protection: Surgical     Comment: pt. given condoms   Other Topics Concern  . None   Social History Narrative  . None   Review of Systems: Review of Systems  Constitutional: Negative for fever, chills and  malaise/fatigue.  HENT: Negative for congestion.   Eyes: Negative for blurred vision.  Respiratory: Negative for cough and shortness of breath.   Cardiovascular: Negative for chest pain and leg swelling.  Gastrointestinal: Negative for heartburn, nausea and vomiting.  Genitourinary: Negative for dysuria, frequency, hematuria and flank pain.  Musculoskeletal: Negative for myalgias.  Skin: Negative for itching and rash.  Neurological: Negative for dizziness, weakness and headaches.    Objective:  Physical Exam: Filed Vitals:   08/17/13 1538  BP: 177/84  Pulse: 63  Temp: 98.1 F (36.7 C)  TempSrc: Oral  Height: 5' 2.5" (1.588 m)  Weight: 133 lb 14.4 oz (60.737 kg)  SpO2: 98%  Physical Exam  Nursing note and vitals reviewed. Constitutional: She is well-developed, well-nourished, and in no distress. No distress.  HENT:  Head: Normocephalic and atraumatic.  Eyes: Conjunctivae are normal.  Cardiovascular: Normal rate, regular rhythm and normal heart sounds.   Pulmonary/Chest: Effort normal and breath sounds normal.  Abdominal: Soft. Bowel sounds are normal.  Genitourinary: Vagina exhibits lesion.  1cm lesion on right labia majora, no drainage  Musculoskeletal: She exhibits no edema.  Skin: She is not diaphoretic.     Assessment & Plan:   See Problem Based Assessment and Plan

## 2013-08-18 NOTE — Progress Notes (Signed)
I saw and evaluated the patient.  I personally confirmed the key portions of the history and exam documented by Dr. Mikey Bussing and I reviewed pertinent patient test results.  The assessment, diagnosis, and plan were formulated together and I agree with the documentation in the resident's note. I was present during the PE with Dr Mikey Bussing.

## 2013-08-23 LAB — HERPES SIMPLEX VIRUS CULTURE: Organism ID, Bacteria: NOT DETECTED

## 2013-09-03 ENCOUNTER — Ambulatory Visit: Payer: PRIVATE HEALTH INSURANCE | Admitting: Internal Medicine

## 2013-09-10 ENCOUNTER — Encounter: Payer: Self-pay | Admitting: Internal Medicine

## 2013-09-10 NOTE — Progress Notes (Signed)
Patient ID: Nicole Mccormick, female   DOB: August 19, 1946, 67 y.o.   MRN: [id number]  This patient has been identified as a high fall risk and is a female that is 65+.  Please consider ordering a DEXA scan to screen for osteoporosis.

## 2013-09-14 ENCOUNTER — Encounter: Payer: Self-pay | Admitting: Internal Medicine

## 2013-09-20 ENCOUNTER — Encounter: Payer: Self-pay | Admitting: Internal Medicine

## 2013-09-20 ENCOUNTER — Ambulatory Visit (INDEPENDENT_AMBULATORY_CARE_PROVIDER_SITE_OTHER): Payer: PRIVATE HEALTH INSURANCE | Admitting: Internal Medicine

## 2013-09-20 VITALS — BP 188/84 | HR 66 | Temp 97.6°F | Ht 62.0 in | Wt 134.1 lb

## 2013-09-20 DIAGNOSIS — B20 Human immunodeficiency virus [HIV] disease: Secondary | ICD-10-CM

## 2013-09-20 DIAGNOSIS — I10 Essential (primary) hypertension: Secondary | ICD-10-CM

## 2013-09-20 DIAGNOSIS — J069 Acute upper respiratory infection, unspecified: Secondary | ICD-10-CM

## 2013-09-20 DIAGNOSIS — F17200 Nicotine dependence, unspecified, uncomplicated: Secondary | ICD-10-CM

## 2013-09-20 MED ORDER — BENZONATATE 100 MG PO CAPS: 100.0000 mg | ORAL_CAPSULE | Freq: Four times a day (QID) | ORAL | Status: DC | PRN

## 2013-09-20 MED ORDER — ENSURE NUTRA SHAKE HI-CAL PO LIQD: 1.0000 | Freq: Two times a day (BID) | ORAL | Status: DC

## 2013-09-20 MED ORDER — VARENICLINE TARTRATE 1 MG PO TABS: 1.0000 mg | ORAL_TABLET | Freq: Two times a day (BID) | ORAL | Status: DC

## 2013-09-20 NOTE — Assessment & Plan Note (Signed)
BP Readings from Last 3 Encounters:  09/20/13 188/84  08/17/13 177/84  07/23/13 157/79    Lab Results  Component Value Date   NA 139 06/28/2013   K 4.8 06/28/2013   CREATININE 0.64 06/28/2013    Assessment: Blood pressure control: moderately elevated Progress toward BP goal:  unchanged Comments: blood pressure remains elevated today. The patient notes noncompliance with p.m. dose of lisinopril-hydrochlorothiazide.  Plan: Medications:  Start taking lisinopril-HCTZ 2 tabs every morning. Continue taking amlodipine Educational resources provided: brochure Self management tools provided: home blood pressure logbook Other plans: recheck at next visit

## 2013-09-20 NOTE — Patient Instructions (Addendum)
General Instructions: Your blood pressure is elevated today: - Start taking your Lisinopril-HCTZ medication, 2 tablets every morning - Continue to take your amlodipine every morning - If your BP is still elevated at your next visit, we may need to add another medication  Your cough is likely due to a viral infection -take Tessalon perles up to 4 times per day as needed for cough -if symptoms do not improve, come back to clinic for a follow-up visit  Quitting smoking is the best thing you can do for your health!  We are prescribing Chantix.  Take as follows: -Start 1 week before your quit date -Days 1-3, take 1/2 of a tablet once per day -Days 4-7, take 1/2 of a tablet twice per day -After the first week, take 1 tablet twice per day for 3 months  Please return for a follow-up visit in 3-4 months.  Treatment Goals:  Goals (1 Years of Data) as of 09/20/13     Lifestyle    . Prevent Falls       Progress Toward Treatment Goals:  Treatment Goal 09/20/2013  Blood pressure unchanged  Stop smoking smoking less  Prevent falls unchanged    Self Care Goals & Plans:  Self Care Goal 09/20/2013  Manage my medications take my medicines as prescribed; bring my medications to every visit; refill my medications on time  Monitor my health -  Eat healthy foods drink diet soda or water instead of juice or soda; eat more vegetables; eat foods that are low in salt; eat baked foods instead of fried foods  Be physically active -  Stop smoking -  Prevent falls -    No flowsheet data found.   Care Management & Community Referrals:  Referral 09/20/2013  Referrals made for care management support none needed

## 2013-09-20 NOTE — Assessment & Plan Note (Signed)
  Assessment: Progress toward smoking cessation:  smoking less Barriers to progress toward smoking cessation:  withdrawal symptoms Comments: Patient interested in quitting smoking  Plan: Instruction/counseling given:  I counseled patient on the dangers of tobacco use, advised patient to stop smoking, and reviewed strategies to maximize success. Educational resources provided:  [employer]) brochure Self management tools provided:    Medications to assist with smoking cessation:  Varenicline (Chantix) Patient agreed to the following self-care plans for smoking cessation:    Other plans: [employer], chantix is $0 with her insurance.  I warned the patient about the side effects of vivid dreams.  Patient plans to start this week, with her quit date next week

## 2013-09-20 NOTE — Assessment & Plan Note (Signed)
The patient notes a 3 day history of symptoms which likely represents a viral URI. -Prescribed Tessalon Perles for symptoms of cough, which is the patient's most bothersome symptom -patient instructed to return to care symptoms do not improve

## 2013-09-20 NOTE — Progress Notes (Signed)
HPI The patient is a 67 y.o. female with a history of HIV (CD4 = 460), HTN, CAD, tobacco abuse, presenting for a BP recheck.  The patient has a history of HTN.  At her last visit, BP was elevated to 177/84.  HCTZ was added at her last visit, and she takes lisinopril-HCTZ BID, though she often forgets the evening dose.  However, today, BP remains elevated at 188/84.  The patient has a history of tobacco abuse.  The patient continues to smoke 1/3 ppd, for 55 yrs.  She has tried to quit in the past.  She patient used nicotine patches, but noted side effects of bad dreams.  She is interested in Chantix.  The patient notes a 3-day history of cough productive of whitish mucous, horseness, mild congestion.  No sore throat, fevers, sick contacts.She has not taken any OTC cold medications.  ROS: General: no fevers, chills, changes in weight, changes in appetite Skin: no rash HEENT: no blurry vision, hearing changes Pulm: see HPI CV: no chest pain, palpitations, shortness of breath Abd: no abdominal pain, nausea/vomiting, diarrhea/constipation GU: no dysuria, hematuria, polyuria Ext: no arthralgias, myalgias Neuro: no weakness, numbness, or tingling  Filed Vitals:   09/20/13 1502  BP: 188/84  Pulse: 66  Temp: 97.6 F (36.4 C)    PEX General: alert, cooperative, and in no apparent distress HEENT: PERRL, EOMI, pharynx non-erythematous, bilateral tympanic membranes unremarkable, no sinus tenderness to palpation Neck: supple, one enlarged left anterior cervical lymph node Lungs: clear to ascultation bilaterally, normal work of respiration, no wheezes, rales, ronchi Heart: regular rate and rhythm, no murmurs, gallops, or rubs Abdomen: soft, non-tender, non-distended, normal bowel sounds Extremities: no cyanosis, clubbing, or edema Neurologic: alert & oriented X3, cranial nerves II-XII intact, strength grossly intact, sensation intact to light touch  Current Outpatient Prescriptions on File  Prior to Visit  Medication Sig Dispense Refill  . amLODipine (NORVASC) 10 MG tablet Take 1 tablet (10 mg total) by mouth daily.  30 tablet  11  . aspirin EC 81 MG tablet Take 1 tablet (81 mg total) by mouth daily.  30 tablet  6  . atazanavir (REYATAZ) 200 MG capsule take 2 capsules by mouth once daily **SPACED 12 HOURS FROM ANTACIDS**  60 capsule  6  . cyclobenzaprine (FLEXERIL) 10 MG tablet Take 1 tablet (10 mg total) by mouth 3 (three) times daily as needed. For muscle spasms  60 tablet  3  . emtricitabine-tenofovir (TRUVADA) 200-300 MG per tablet Take 1 tablet by mouth daily.  30 tablet  6  . esomeprazole (NEXIUM) 40 MG capsule Take 1 capsule (40 mg total) by mouth daily.  30 capsule  6  . gabapentin (NEURONTIN) 600 MG tablet Take 1 tablet (600 mg total) by mouth 3 (three) times daily.  90 tablet  3  . lisinopril (PRINIVIL,ZESTRIL) 20 MG tablet Take 1 tablet (20 mg total) by mouth daily.  30 tablet  11  . lisinopril-hydrochlorothiazide (PRINZIDE,ZESTORETIC) 20-25 MG per tablet take 1 tablet by mouth once daily  30 tablet  6  . Nutritional Supplements (ENSURE NUTRA SHAKE HI-CAL) LIQD Take 1 Can by mouth 2 (two) times daily.  60 Can  6  . PARoxetine (PAXIL) 20 MG tablet Take 1 tablet (20 mg total) by mouth daily.  30 tablet  2  . sucralfate (CARAFATE) 1 G tablet Take 1 tablet (1 g total) by mouth 4 (four) times daily.  120 tablet  0  . zoster vaccine live, PF, (ZOSTAVAX) 16109  UNT/0.65ML injection Inject 19,400 Units into the skin once.  1 each  0   No current facility-administered medications on file prior to visit.    Assessment/Plan

## 2013-09-21 NOTE — Progress Notes (Signed)
Case discussed with Dr. Brown at the time of the visit.  We reviewed the resident's history and exam and pertinent patient test results.  I agree with the assessment, diagnosis, and plan of care documented in the resident's note. 

## 2013-10-06 ENCOUNTER — Telehealth: Payer: Self-pay | Admitting: *Deleted

## 2013-10-06 NOTE — Telephone Encounter (Signed)
Called and let patient know that Dr. Ninetta Lights denied filling out her disability housing form and suggested she send it to her PCP Dr. Dorise Hiss. Dr. Ninetta Lights sees patient for her HIV and is unaware of her disability issues. Patient asked that form be sent to Dr. Lavonna Monarch office, form faxed. Wendall Mola

## 2013-10-08 ENCOUNTER — Other Ambulatory Visit: Payer: Self-pay | Admitting: Licensed Clinical Social Worker

## 2013-10-08 DIAGNOSIS — I10 Essential (primary) hypertension: Secondary | ICD-10-CM

## 2013-10-08 MED ORDER — LISINOPRIL-HYDROCHLOROTHIAZIDE 20-25 MG PO TABS: ORAL_TABLET | ORAL | Status: DC

## 2013-10-08 MED ORDER — AMLODIPINE BESYLATE 10 MG PO TABS: 10.0000 mg | ORAL_TABLET | Freq: Every day | ORAL | Status: DC

## 2013-10-18 ENCOUNTER — Other Ambulatory Visit: Payer: Self-pay | Admitting: Licensed Clinical Social Worker

## 2013-10-18 DIAGNOSIS — B20 Human immunodeficiency virus [HIV] disease: Secondary | ICD-10-CM

## 2013-10-18 MED ORDER — ENSURE NUTRA SHAKE HI-CAL PO LIQD: 1.0000 | Freq: Two times a day (BID) | ORAL | Status: DC

## 2013-11-18 ENCOUNTER — Ambulatory Visit (INDEPENDENT_AMBULATORY_CARE_PROVIDER_SITE_OTHER): Payer: PRIVATE HEALTH INSURANCE | Admitting: Internal Medicine

## 2013-11-18 DIAGNOSIS — M25512 Pain in left shoulder: Secondary | ICD-10-CM | POA: Insufficient documentation

## 2013-11-18 DIAGNOSIS — M25519 Pain in unspecified shoulder: Secondary | ICD-10-CM

## 2013-11-18 DIAGNOSIS — I10 Essential (primary) hypertension: Secondary | ICD-10-CM

## 2013-11-18 MED ORDER — HYDROCODONE-ACETAMINOPHEN 5-325 MG PO TABS: 1.0000 | ORAL_TABLET | Freq: Two times a day (BID) | ORAL | Status: DC | PRN

## 2013-11-18 NOTE — Patient Instructions (Signed)
-  Please take vicodin up to twice daily as needed for pain, take laxative as needed for constipation -Please do the exercises we discussed to prevent your shoulder from freezing up -Please attend your appointment with sports medicine on 12/08/13 at 9:00AM  -Happy holidays!

## 2013-11-18 NOTE — Progress Notes (Signed)
Patient ID: Nicole Mccormick, female   DOB: [birth year], 67 y.o.   MRN: [id number]   Subjective:   Patient ID: Nicole Mccormick female   DOB: Aug 09, 1946 67 y.o.   MRN: [id number]  HPI: [name] is a 67 y.o. pleasant woman with past medical history of HIV infection (last CD4 460 on 7/21), hypertension, CAD with previous MI, iron-deficiency anemia, chronic back pain, and tobacco abuse who presents with chief complaint of left shoulder pain of 2 week duration. Pt reports that on the day of Thanksgiving she was sitting on the edge of her bed and slipped and fell onto the bed rail onto her left shoulder. She states her shoulder did not hurt much at that time but has been progressively getting worse for the past 2 weeks to the point it is awakening her from sleep. She is not able to raise her arm, dress herself, and has been recently dropping objects out of her hand due to weakness. She has used 6-8 Advil a day which has not helped relief the pain much. She has been rubbing her shoulder which helps relief some of the pain. She denies fever, chills, rash, warmth, and swelling in her left shoulder. She has never had previous injury to her left shoulder before but per records she has has had Xray of her left shoulder in 2002 for left shoulder pain without injury. She is not on corticosteroids, hormone replacement, and there is no history of osteoporosis.     Past Medical History  Diagnosis Date  . Hypertension   . Abnormal Pap smear   . Asthma   . Coronary artery disease   . HIV infection   . Varicosities   . Seizures     last sz fri nov 2  . GERD (gastroesophageal reflux disease)     previously on aciphex, discontinued november 2012  because patient asymptomatic, and concern about interference with HIV meds.  May try pepcid in the future if symptoms return  . Iron deficiency anemia     Ferritin = 2 in november 2012, started on iron supplemenation  . Arthritis   . CHF (congestive heart failure)   . Heart  murmur    Current Outpatient Prescriptions  Medication Sig Dispense Refill  . amLODipine (NORVASC) 10 MG tablet Take 1 tablet (10 mg total) by mouth daily.  30 tablet  11  . aspirin EC 81 MG tablet Take 1 tablet (81 mg total) by mouth daily.  30 tablet  6  . atazanavir (REYATAZ) 200 MG capsule take 2 capsules by mouth once daily **SPACED 12 HOURS FROM ANTACIDS**  60 capsule  6  . benzonatate (TESSALON PERLES) 100 MG capsule Take 1 capsule (100 mg total) by mouth every 6 (six) hours as needed for cough.  30 capsule  1  . cyclobenzaprine (FLEXERIL) 10 MG tablet Take 1 tablet (10 mg total) by mouth 3 (three) times daily as needed. For muscle spasms  60 tablet  3  . emtricitabine-tenofovir (TRUVADA) 200-300 MG per tablet Take 1 tablet by mouth daily.  30 tablet  6  . esomeprazole (NEXIUM) 40 MG capsule Take 1 capsule (40 mg total) by mouth daily.  30 capsule  6  . gabapentin (NEURONTIN) 600 MG tablet Take 1 tablet (600 mg total) by mouth 3 (three) times daily.  90 tablet  3  . lisinopril-hydrochlorothiazide (PRINZIDE,ZESTORETIC) 20-25 MG per tablet take 1 tablet by mouth once daily  30 tablet  11  . Nutritional Supplements (ENSURE NUTRA SHAKE  HI-CAL) LIQD Take 1 Can by mouth 2 (two) times daily.  60 Can  6  . PARoxetine (PAXIL) 20 MG tablet Take 1 tablet (20 mg total) by mouth daily.  30 tablet  2  . sucralfate (CARAFATE) 1 G tablet Take 1 tablet (1 g total) by mouth 4 (four) times daily.  120 tablet  0  . varenicline (CHANTIX) 1 MG tablet Take 1 tablet (1 mg total) by mouth 2 (two) times daily. Follow special instructions for the first week  60 tablet  2  . zoster vaccine live, PF, (ZOSTAVAX) 16109 UNT/0.65ML injection Inject 19,400 Units into the skin once.  1 each  0   No current facility-administered medications for this visit.   Family History  Problem Relation Age of Onset  . Diabetes Mother   . Ovarian cancer Sister   . Cancer Sister     ovarian and colon  . Colon cancer Neg Hx     History   Social History  . Marital Status: Single    Spouse Name: N/A    Number of Children: N/A  . Years of Education: N/A   Social History Main Topics  . Smoking status: Current Every Day Smoker -- 0.25 packs/day for 50 years    Types: Cigarettes  . Smokeless tobacco: Never Used     Comment: working on it  . Alcohol Use: 1.2 oz/week    2 Cans of beer per week     Comment: occasional  . Drug Use: No  . Sexual Activity: Yes    Partners: Male    Birth Control/ Protection: Surgical     Comment: pt. given condoms   Other Topics Concern  . Not on file   Social History Narrative  . No narrative on file   Review of Systems: Review of Systems  Constitutional: Negative for fever, chills, weight loss and malaise/fatigue.  HENT: Negative for congestion and sore throat.   Eyes: Negative for blurred vision.  Respiratory: Negative for cough and shortness of breath.   Cardiovascular: Negative for chest pain, palpitations and leg swelling.  Gastrointestinal: Negative for nausea, vomiting, abdominal pain, diarrhea, constipation and blood in stool.  Genitourinary: Negative for dysuria, urgency, frequency and hematuria.  Musculoskeletal: Positive for back pain (chronic), falls and joint pain (left shoulder).  Skin: Negative for rash.  Neurological: Negative for dizziness, sensory change, focal weakness, weakness and headaches.    Objective:  Physical Exam: There were no vitals filed for this visit. Physical Exam  Constitutional: She is oriented to person, place, and time. She appears well-developed and well-nourished. No distress.  HENT:  Head: Normocephalic and atraumatic.  Right Ear: External ear normal.  Left Ear: External ear normal.  Nose: Nose normal.  Mouth/Throat: No oropharyngeal exudate.  Eyes: Conjunctivae and EOM are normal. Pupils are equal, round, and reactive to light. Right eye exhibits no discharge. Left eye exhibits no discharge.  Neck: Normal range of  motion. Neck supple.  Cardiovascular: Normal rate, regular rhythm and normal heart sounds.   Pulmonary/Chest: Effort normal and breath sounds normal. No respiratory distress. She has no wheezes. She has no rales.  Abdominal: Soft. Bowel sounds are normal. She exhibits no distension. There is no tenderness. There is no rebound and no guarding.  Musculoskeletal: She exhibits tenderness. She exhibits no edema.  Reduced ROM of left shoulder with weakness on abduction, tenderness of joint and biceps muscle without overlying warmth or erythema   + Hawkins test + Empty can test  +  Drop test  Neurological: She is alert and oriented to person, place, and time.  4/5 left UE strength, otherwise 5/5  Normal sensation to light touch  Skin: Skin is warm and dry. No rash noted. She is not diaphoretic. No erythema. No pallor.  Psychiatric: She has a normal mood and affect. Her behavior is normal. Judgment and thought content normal.    Assessment & Plan:  Please see problem list for problem-based assessment and plan

## 2013-11-18 NOTE — Progress Notes (Signed)
I saw and evaluated the patient.  I personally confirmed the key portions of the history and exam documented by Dr. Rabbani and I reviewed pertinent patient test results.  The assessment, diagnosis, and plan were formulated together and I agree with the documentation in the resident's note.  

## 2013-11-18 NOTE — Assessment & Plan Note (Signed)
Assessment: Pt with fairly well-controlled hypertension who is compliant with three class anti-hypertensive therapy  (CCB, diuretic, ACEi) and is asymptomatic who presents with blood pressure of 145/74.   Plan: -BP near goal of <140/80 -Continue lisinopril-HCTZ 20-25 mg daily -Continue amlodipine 10 mg daily  -Continue to monitor

## 2013-11-18 NOTE — Assessment & Plan Note (Addendum)
Assessment: Pt with left shoulder pain, tenderness, and reduced ROM of 2 week duration after fall without inflammatory changes and found to have shoulder instability with weakness on abduction concerning for rotator cuff injury. Other etiologies include subacromial bursitis, bicipital tendonitis, adhesive capsulitis, and deltoid muscle strain.   Plan: -Refer to sports medicine for further evaluation--> appt on 12/08/14 -Hydrocodone-acetaminophen 5-325 mg BID PRN for 20 pills without refill -Pt instructed to continue stretching exercises at home to prevent adhesive capsulitis

## 2013-12-08 ENCOUNTER — Encounter: Payer: Self-pay | Admitting: Sports Medicine

## 2013-12-08 ENCOUNTER — Ambulatory Visit (INDEPENDENT_AMBULATORY_CARE_PROVIDER_SITE_OTHER): Payer: PRIVATE HEALTH INSURANCE | Admitting: Sports Medicine

## 2013-12-08 VITALS — BP 184/79 | HR 57 | Ht 62.0 in | Wt 134.0 lb

## 2013-12-08 DIAGNOSIS — M25519 Pain in unspecified shoulder: Secondary | ICD-10-CM

## 2013-12-08 DIAGNOSIS — M25512 Pain in left shoulder: Secondary | ICD-10-CM

## 2013-12-08 MED ORDER — METHYLPREDNISOLONE ACETATE 40 MG/ML IJ SUSP
40.0000 mg | Freq: Once | INTRAMUSCULAR | Status: AC
  Administered 2013-12-08: 40 mg via INTRA_ARTICULAR

## 2013-12-08 NOTE — Patient Instructions (Signed)
Don't forget to go to outpatient radiology at the hospital for your x-rays  Be sure to see me again in 2 weeks

## 2013-12-08 NOTE — Progress Notes (Signed)
   Subjective:    Patient ID: Nicole Mccormick, female    DOB: 12/28/1945, 68 y.o.   MRN: [id number]  HPI chief complaint: Left shoulder pain  Very pleasant 67 year old female comes in today complaining of 1 month of left shoulder pain. Pain began after she fell. She was getting out of bed and suffered what sounds like a hyperextension injury to the left arm and shoulder. She's not sure if she struck her arm on anything. Pain did not begin until about a week later. It is along the lateral shoulder and upper arm. Worse with activity. Improves at rest. She was given a prescription for hydrocodone which she is taking intermittently. No associated numbness or tingling. No problems with the shoulder in the past. No prior shoulder surgeries. She is noticing weakness as well as loss of mobility. Pain awakens her at night. She is here today with her daughter.  Past medical history and current medications are reviewed    Review of Systems     Objective:   Physical Exam Well-developed, well-nourished. No acute distress  Left shoulder: Active forward flexion to about 80. Active abduction to 90. Passively she is able to achieve about 120. Internal rotation is limited to 70. External rotation is 80. Positive drop arm. Global weakness secondary to pain. No tenderness to palpation along the clavicle or over the a.c. Joint. There is some tenderness to palpation at the proximal humerus but no soft tissue swelling and no ecchymosis. No Popeye deformity. No erythema. Neurovascularly intact distally.  Right shoulder: Full range of motion. Rotator cuff strength 5/5. Neurovascularly intact distally.       Assessment & Plan:  Left shoulder pain likely secondary to rotator cuff tear  Given patient's multiple comorbidities she is not a good surgical candidate. I recommend that we start with a subacromial cortisone injection. Although my clinical suspicion is low I would like to get some x-rays of her shoulder and  humerus to rule out any fractures here. Patient is encouraged to continue with activity as tolerated in regards to her left shoulder but to avoid any heavy lifting. She will followup with me in 2 weeks. If she still has limited mobility or significant pain I will consider further diagnostic imaging initially in the form of an ultrasound. Patient and her daughter are encouraged to call with questions or concerns in the interim.  Consent obtained and verified. Time-out conducted. Noted no overlying erythema, induration, or other signs of local infection. Skin prepped in a sterile fashion. Topical analgesic spray: Ethyl chloride. Joint: left subacromial space Needle: 25g 1.5 inch Completed without difficulty. Meds: 1cc (40mg ) depomedrol, 3cc 1% xylocaine  Advised to call if fevers/chills, erythema, induration, drainage, or persistent bleeding.

## 2013-12-13 ENCOUNTER — Ambulatory Visit (HOSPITAL_COMMUNITY)
Admission: RE | Admit: 2013-12-13 | Discharge: 2013-12-13 | Disposition: A | Discharge status: Home / Self Care | Payer: PRIVATE HEALTH INSURANCE | Source: Ambulatory Visit | Attending: Sports Medicine | Admitting: Sports Medicine

## 2013-12-13 DIAGNOSIS — M25519 Pain in unspecified shoulder: Secondary | ICD-10-CM | POA: Insufficient documentation

## 2013-12-13 DIAGNOSIS — M25512 Pain in left shoulder: Secondary | ICD-10-CM

## 2013-12-13 DIAGNOSIS — M19019 Primary osteoarthritis, unspecified shoulder: Secondary | ICD-10-CM | POA: Insufficient documentation

## 2013-12-13 DIAGNOSIS — M79609 Pain in unspecified limb: Secondary | ICD-10-CM | POA: Insufficient documentation

## 2013-12-22 ENCOUNTER — Encounter: Payer: Self-pay | Admitting: Sports Medicine

## 2013-12-22 ENCOUNTER — Ambulatory Visit (INDEPENDENT_AMBULATORY_CARE_PROVIDER_SITE_OTHER): Payer: PRIVATE HEALTH INSURANCE | Admitting: Sports Medicine

## 2013-12-22 VITALS — BP 174/75 | HR 53 | Ht 62.0 in | Wt 137.0 lb

## 2013-12-22 DIAGNOSIS — M25519 Pain in unspecified shoulder: Secondary | ICD-10-CM

## 2013-12-22 DIAGNOSIS — M25512 Pain in left shoulder: Secondary | ICD-10-CM

## 2013-12-22 MED ORDER — ACETAMINOPHEN-CODEINE #3 300-30 MG PO TABS: 1.0000 | ORAL_TABLET | Freq: Three times a day (TID) | ORAL | Status: DC | PRN

## 2013-12-22 MED ORDER — METHYLPREDNISOLONE ACETATE 40 MG/ML IJ SUSP
40.0000 mg | Freq: Once | INTRAMUSCULAR | Status: AC
  Administered 2013-12-22: 40 mg via INTRA_ARTICULAR

## 2013-12-22 NOTE — Progress Notes (Signed)
   Subjective:    Patient ID: Nicole Mccormick, female    DOB: Feb 18, 1946, 68 y.o.   MRN: [id number]  HPI Patient comes in today for followup on left shoulder pain. X-rays of the left shoulder show no obvious fracture. She is still having pain and catching in the shoulder particularly with reaching overhead or away from her body. Still having nighttime pain as well. Subacromial cortisone injection provided her with some symptom relief but not complete symptom resolution. She still denies numbness and tingling into her hand. She was taking hydrocodone which was helping with her pain but has since run out. She is here today with her daughter.    Review of Systems     Objective:   Physical Exam Well-developed, well-nourished. No acute distress. Vital signs are reviewed  Left shoulder: Active forward flexion and abduction to 90. Passively I'm able to get her to 120 degrees. Internal rotation is 80. External rotation 70-80. There is some tenderness to palpation over the bicipital groove. 4/5 strength with resisted supraspinatus on the left compared to 5/5 on the right. 5/5 strength with resisted external rotation and internal rotation on the left. Markedly positive O'Brien's. No obvious Popeye deformity. Neurovascularly intact distally.  [facility] ultrasound of the left shoulder-limited images were obtained. Supraspinatus tendon appears to be intact with no obvious tear. There is quite a bit of hypoechoic change in the bicipital groove with only a couple of strands of tendon appreciated. Findings are concerning for possible proximal biceps tendon tear or labral tear.       Assessment & Plan:  Persistent left shoulder pain-question labral tear  I think we should try an intra-articular cortisone injection today. Patient is in agreement. Prescription for Tylenol No. 3 to take as needed for severe pain. Followup with me in 3 weeks. If symptoms persist consider merits of further diagnostic imaging in the form  of an MRI. If surgery is ultimately needed she will need medical clearance from her PCP first.  Consent obtained and verified. Time-out conducted. Noted no overlying erythema, induration, or other signs of local infection. Skin prepped in a sterile fashion. Topical analgesic spray: Ethyl chloride. Joint: left shoulder Needle: 25g 1.5 inch Completed without difficulty. Meds: 3cc 1% xylocaine, 1cc (40mg ) depo-medrol  Advised to call if fevers/chills, erythema, induration, drainage, or persistent bleeding.

## 2013-12-29 ENCOUNTER — Ambulatory Visit (INDEPENDENT_AMBULATORY_CARE_PROVIDER_SITE_OTHER): Payer: PRIVATE HEALTH INSURANCE | Admitting: Internal Medicine

## 2013-12-29 ENCOUNTER — Encounter: Payer: Self-pay | Admitting: Internal Medicine

## 2013-12-29 ENCOUNTER — Other Ambulatory Visit: Payer: PRIVATE HEALTH INSURANCE

## 2013-12-29 ENCOUNTER — Encounter: Payer: Self-pay | Admitting: *Deleted

## 2013-12-29 VITALS — BP 183/72 | HR 52 | Temp 97.9°F | Ht 62.0 in | Wt 134.8 lb

## 2013-12-29 DIAGNOSIS — B20 Human immunodeficiency virus [HIV] disease: Secondary | ICD-10-CM

## 2013-12-29 DIAGNOSIS — N949 Unspecified condition associated with female genital organs and menstrual cycle: Secondary | ICD-10-CM

## 2013-12-29 DIAGNOSIS — N9489 Other specified conditions associated with female genital organs and menstrual cycle: Secondary | ICD-10-CM

## 2013-12-29 LAB — CBC WITH DIFFERENTIAL/PLATELET
Basophils Absolute: 0 10*3/uL (ref 0.0–0.1)
Basophils Relative: 1 % (ref 0–1)
Eosinophils Absolute: 0.1 10*3/uL (ref 0.0–0.7)
Eosinophils Relative: 1 % (ref 0–5)
HCT: 43 % (ref 36.0–46.0)
Hemoglobin: 14.4 g/dL (ref 12.0–15.0)
Lymphocytes Relative: 34 % (ref 12–46)
Lymphs Abs: 2.1 10*3/uL (ref 0.7–4.0)
MCH: 34.6 pg — ABNORMAL HIGH (ref 26.0–34.0)
MCHC: 33.5 g/dL (ref 30.0–36.0)
MCV: 103.4 fL — ABNORMAL HIGH (ref 78.0–100.0)
Monocytes Absolute: 0.6 10*3/uL (ref 0.1–1.0)
Monocytes Relative: 10 % (ref 3–12)
Neutro Abs: 3.4 10*3/uL (ref 1.7–7.7)
Neutrophils Relative %: 54 % (ref 43–77)
Platelets: 222 10*3/uL (ref 150–400)
RBC: 4.16 MIL/uL (ref 3.87–5.11)
RDW: 13.9 % (ref 11.5–15.5)
WBC: 6.2 10*3/uL (ref 4.0–10.5)

## 2013-12-29 MED ORDER — ACYCLOVIR 400 MG PO TABS: 400.0000 mg | ORAL_TABLET | Freq: Three times a day (TID) | ORAL | Status: AC

## 2013-12-29 NOTE — Progress Notes (Signed)
Case discussed with Dr. Sadek soon after the resident saw the patient.  We reviewed the resident's history and exam and pertinent patient test results.  I agree with the assessment, diagnosis, and plan of care documented in the resident's note. 

## 2013-12-29 NOTE — Assessment & Plan Note (Signed)
His HIV is currently well-controlled with undetectable viral load. She has had a history of 1 herpes outbreak back in 2014 that had complete resolution with empiric treatment of acyclovir. She did have a herpes culture sent at that time that was negative. Patient does have typical appearing herpes lesion on the left labia. -Acyclovir 400 mg 3 times a day x10 days

## 2013-12-29 NOTE — Progress Notes (Signed)
   Subjective:    Patient ID: Nicole Mccormick, female    DOB: [birth year], 68 y.o.   MRN: [id number]  HPI Nicole Mccormick is a 68 yo woman pmh as listed below presents for labial lesion.   Patient states that within the last 48 hours she's had a slightly tender left labial lesion that she noticed when taking a bath. She's had similar lesion back in September of 2014 that was treated with acyclovir and the patient stated she had complete resolution of the lesions thought to be secondary to herpes. Patient still has her same sexual partner who has not noticed any lesions. She's not had any vaginal pain dyspareunia, vaginal discharge, fevers or chills, or abdominal pain. She's not tried anything over-the-counter for relief. She does not remember any trauma to the area   Review of Systems  Constitutional: Negative for fever and chills.  Gastrointestinal: Negative for nausea, vomiting, abdominal pain, diarrhea, blood in stool and anal bleeding.  Genitourinary: Positive for genital sores (Singulair lesion on left labia). Negative for urgency, frequency, hematuria, vaginal bleeding, vaginal discharge, difficulty urinating, vaginal pain, menstrual problem, pelvic pain and dyspareunia.  Musculoskeletal: Negative for back pain.  Skin: Negative for rash and wound.    Past Medical History  Diagnosis Date  . Hypertension   . Abnormal Pap smear   . Asthma   . Coronary artery disease   . HIV infection   . Varicosities   . Seizures     last sz fri nov 2  . GERD (gastroesophageal reflux disease)     previously on aciphex, discontinued november 2012  because patient asymptomatic, and concern about interference with HIV meds.  May try pepcid in the future if symptoms return  . Iron deficiency anemia     Ferritin = 2 in november 2012, started on iron supplemenation  . Arthritis   . CHF (congestive heart failure)   . Heart murmur    Social, surgical, family history reviewed with patient and updated in appropriate  chart locations.     Objective:   Physical Exam Filed Vitals:   12/29/13 0841  BP: 183/72  Pulse: 52  Temp: 97.9 F (36.6 C)   General: Sitting in chair, NAD HEENT: PERRL, EOMI, no scleral icterus Cardiac: RRR, no rubs, murmurs or gallops Pulm: clear to auscultation bilaterally, no crackles wheezes or rhonchi, moving normal volumes of air Abd: soft, nontender, nondistended, BS present Ext: warm and well perfused, no pedal edema GU: Isolated lesion on the left labia 1 cm circular with ulceration no pustules or fluid filled vesicles, some rectal skin tags, no vaginal discharge Neuro: alert and oriented X3, cranial nerves II-XII grossly intact    Assessment & Plan:  Please see problem oriented charting  Pt discussed with Dr. Lynnae January

## 2013-12-29 NOTE — Progress Notes (Unsigned)
Patient ID: Nicole Mccormick, female   DOB: 23-Feb-1946, 68 y.o.   MRN: [id number] Pt presents c/o "outbreak of herpes" desires to be seen

## 2013-12-29 NOTE — Patient Instructions (Signed)
Genital Herpes  Genital herpes is a sexually transmitted disease. This means that it is a disease passed by having sex with an infected person. There is no cure for genital herpes. The time between attacks can be months to years. The virus may live in a person but produce no problems (symptoms). This infection can be passed to a baby as it travels down the birth canal (vagina). In a newborn, this can cause central nervous system damage, eye damage, or even death. The virus that causes genital herpes is usually HSV-2 virus. The virus that causes oral herpes is usually HSV-1. The diagnosis (learning what is wrong) is made through culture results.  SYMPTOMS   Usually symptoms of pain and itching begin a few days to a week after contact. It first appears as small blisters that progress to small painful ulcers which then scab over and heal after several days. It affects the outer genitalia, birth canal, cervix, penis, anal area, buttocks, and thighs.  HOME CARE INSTRUCTIONS   · Keep ulcerated areas dry and clean.  · Take medications as directed. Antiviral medications can speed up healing. They will not prevent recurrences or cure this infection. These medications can also be taken for suppression if there are frequent recurrences.  · While the infection is active, it is contagious. Avoid all sexual contact during active infections.  · Condoms may help prevent spread of the herpes virus.  · Practice safe sex.  · Wash your hands thoroughly after touching the genital area.  · Avoid touching your eyes after touching your genital area.  · Inform your caregiver if you have had genital herpes and become pregnant. It is your responsibility to insure a safe outcome for your baby in this pregnancy.  · Only take over-the-counter or prescription medicines for pain, discomfort, or fever as directed by your caregiver.  SEEK MEDICAL CARE IF:   · You have a recurrence of this infection.  · You do not respond to medications and are not  improving.  · You have new sources of pain or discharge which have changed from the original infection.  · You have an oral temperature above 102° F (38.9° C).  · You develop abdominal pain.  · You develop eye pain or signs of eye infection.  Document Released: 11/22/2000 Document Revised: 02/17/2012 Document Reviewed: 12/13/2009  Exit[employer]® Patient Information ©2014 [employer].

## 2013-12-29 NOTE — Assessment & Plan Note (Signed)
Patient HIV currently well controlled but has lab work pending for followup appointment that the patient would like done today. -CBC with differential, CMet, HIV RNA, lipid panel, RPR, T. helper cell all performed today

## 2013-12-30 LAB — COMPREHENSIVE METABOLIC PANEL
ALT: 25 U/L (ref 0–35)
AST: 23 U/L (ref 0–37)
Albumin: 3.9 g/dL (ref 3.5–5.2)
Alkaline Phosphatase: 73 U/L (ref 39–117)
BUN: 20 mg/dL (ref 6–23)
CO2: 29 mEq/L (ref 19–32)
Calcium: 9.3 mg/dL (ref 8.4–10.5)
Chloride: 100 mEq/L (ref 96–112)
Creat: 0.77 mg/dL (ref 0.50–1.10)
Glucose, Bld: 85 mg/dL (ref 70–99)
Potassium: 3.6 mEq/L (ref 3.5–5.3)
Sodium: 140 mEq/L (ref 135–145)
Total Bilirubin: 0.8 mg/dL (ref 0.3–1.2)
Total Protein: 7.7 g/dL (ref 6.0–8.3)

## 2013-12-30 LAB — RPR

## 2013-12-30 LAB — LIPID PANEL
Cholesterol: 145 mg/dL (ref 0–200)
HDL: 74 mg/dL (ref >39)
LDL Cholesterol: 59 mg/dL (ref 0–99)
Total CHOL/HDL Ratio: 2 Ratio
Triglycerides: 58 mg/dL (ref <150)
VLDL: 12 mg/dL (ref 0–40)

## 2013-12-30 LAB — T-HELPER CELLS (CD4) COUNT (NOT AT ARMC)
CD4 % Helper T Cell: 24 % — ABNORMAL LOW (ref 33–55)
CD4 T Cell Abs: 530 /uL (ref 400–2700)

## 2013-12-30 LAB — HIV-1 RNA QUANT-NO REFLEX-BLD
HIV 1 RNA Quant: 27 copies/mL — ABNORMAL HIGH (ref <20)
HIV-1 RNA Quant, Log: 1.43 {Log} — ABNORMAL HIGH (ref <1.30)

## 2014-01-12 ENCOUNTER — Ambulatory Visit: Payer: PRIVATE HEALTH INSURANCE | Admitting: Infectious Diseases

## 2014-01-13 ENCOUNTER — Ambulatory Visit: Payer: PRIVATE HEALTH INSURANCE | Admitting: Sports Medicine

## 2014-01-14 ENCOUNTER — Encounter: Payer: PRIVATE HEALTH INSURANCE | Admitting: Internal Medicine

## 2014-01-27 ENCOUNTER — Ambulatory Visit: Payer: PRIVATE HEALTH INSURANCE | Admitting: Sports Medicine

## 2014-02-28 ENCOUNTER — Other Ambulatory Visit: Payer: Self-pay | Admitting: *Deleted

## 2014-02-28 ENCOUNTER — Ambulatory Visit (INDEPENDENT_AMBULATORY_CARE_PROVIDER_SITE_OTHER): Payer: PRIVATE HEALTH INSURANCE | Admitting: Sports Medicine

## 2014-02-28 ENCOUNTER — Encounter: Payer: Self-pay | Admitting: Sports Medicine

## 2014-02-28 VITALS — BP 184/87 | HR 58 | Ht 62.0 in | Wt 134.0 lb

## 2014-02-28 DIAGNOSIS — M25519 Pain in unspecified shoulder: Secondary | ICD-10-CM

## 2014-02-28 DIAGNOSIS — B20 Human immunodeficiency virus [HIV] disease: Secondary | ICD-10-CM

## 2014-02-28 DIAGNOSIS — M25512 Pain in left shoulder: Secondary | ICD-10-CM

## 2014-02-28 MED ORDER — EMTRICITABINE-TENOFOVIR DF 200-300 MG PO TABS: 1.0000 | ORAL_TABLET | Freq: Every day | ORAL | Status: DC

## 2014-02-28 MED ORDER — ATAZANAVIR SULFATE 200 MG PO CAPS: ORAL_CAPSULE | ORAL | Status: DC

## 2014-02-28 MED ORDER — ACETAMINOPHEN-CODEINE #3 300-30 MG PO TABS
1.0000 | ORAL_TABLET | Freq: Three times a day (TID) | ORAL | Status: DC | PRN
Start: 2014-02-28 — End: 2014-05-23

## 2014-02-28 NOTE — Patient Instructions (Signed)
You have been scheduled for an appointment for a MRI on your left shoulder on Wednesday 03/02/14 at 8:15 am   [address] [place], [address]  [phone]  Please schedule a follow up appointment with Dr. Micheline Chapman for next week to go over the MRI results.

## 2014-02-28 NOTE — Progress Notes (Signed)
   Subjective:    Patient ID: Nicole Mccormick, female    DOB: 1946-03-02, 68 y.o.   MRN: [id number]  HPI  Nicole Mccormick is returning to clinic with left shoulder pain. She is received subacromial injection in 11/2013 and then an intra-articular steroid injection in 12/2013. The Intra-articular injection provided relief of pain and improvement in ROM for approximately 7 weeks. However, pain started to return gradually over the last 2 weeks. Tylenol 3 helps but she has been out of medication. She is taking 400-600 MG of Ibuprofen Q3-4H for pain and using muscle rub to relieve symptoms. Pain occurs constantly and at rest with throbbing nature and is worse with movement. She is experiencing some catching with ROM. Pain all began after a trauma several weeks ago.     Review of Systems Negative apart from HPI     Objective:   Physical Exam  Shoulder Inspection: no erythema or swelling Palpation: tender at rotator cuff insertion and along top of scapula ROM: diminished flexion, abduction, internal and external rotation due to pain. Some improvement with passive ROM but this causes worsening pain.  Special Tests: Positive empty can with difficulty getting into position on right and decreased strength.      Assessment & Plan:  68 yo with left shoulder pain which has failed injections.  1. Left Shoulder pain secondary to rotator cuff tear vs labral tear --MRI of left shoulder --Follow-up 2-3 days after MRI to discuss results. Will consider referral to surgery depending on results of MRI. Will need to be medically cleared given cardiac risk --Refill Tylenol 3 PRN for pain  Seen with Jacqulyn Liner, MS4

## 2014-03-01 ENCOUNTER — Other Ambulatory Visit (INDEPENDENT_AMBULATORY_CARE_PROVIDER_SITE_OTHER): Payer: PRIVATE HEALTH INSURANCE

## 2014-03-01 DIAGNOSIS — Z79899 Other long term (current) drug therapy: Secondary | ICD-10-CM

## 2014-03-01 DIAGNOSIS — B20 Human immunodeficiency virus [HIV] disease: Secondary | ICD-10-CM

## 2014-03-01 LAB — CBC WITH DIFFERENTIAL/PLATELET
Basophils Absolute: 0.1 10*3/uL (ref 0.0–0.1)
Basophils Relative: 1 % (ref 0–1)
Eosinophils Absolute: 0.1 10*3/uL (ref 0.0–0.7)
Eosinophils Relative: 1 % (ref 0–5)
HCT: 39.2 % (ref 36.0–46.0)
Hemoglobin: 13.4 g/dL (ref 12.0–15.0)
Lymphocytes Relative: 36 % (ref 12–46)
Lymphs Abs: 1.8 10*3/uL (ref 0.7–4.0)
MCH: 35.2 pg — ABNORMAL HIGH (ref 26.0–34.0)
MCHC: 34.2 g/dL (ref 30.0–36.0)
MCV: 102.9 fL — ABNORMAL HIGH (ref 78.0–100.0)
Monocytes Absolute: 0.5 10*3/uL (ref 0.1–1.0)
Monocytes Relative: 10 % (ref 3–12)
Neutro Abs: 2.6 10*3/uL (ref 1.7–7.7)
Neutrophils Relative %: 52 % (ref 43–77)
Platelets: 243 10*3/uL (ref 150–400)
RBC: 3.81 MIL/uL — ABNORMAL LOW (ref 3.87–5.11)
RDW: 14.3 % (ref 11.5–15.5)
WBC: 5 10*3/uL (ref 4.0–10.5)

## 2014-03-01 LAB — LIPID PANEL
Cholesterol: 127 mg/dL (ref 0–200)
HDL: 51 mg/dL (ref >39)
LDL Cholesterol: 56 mg/dL (ref 0–99)
Total CHOL/HDL Ratio: 2.5 Ratio
Triglycerides: 102 mg/dL (ref <150)
VLDL: 20 mg/dL (ref 0–40)

## 2014-03-01 LAB — COMPLETE METABOLIC PANEL WITH GFR
ALT: 39 U/L — ABNORMAL HIGH (ref 0–35)
AST: 37 U/L (ref 0–37)
Albumin: 3.7 g/dL (ref 3.5–5.2)
Alkaline Phosphatase: 68 U/L (ref 39–117)
BUN: 15 mg/dL (ref 6–23)
CO2: 32 mEq/L (ref 19–32)
Calcium: 9.2 mg/dL (ref 8.4–10.5)
Chloride: 100 mEq/L (ref 96–112)
Creat: 0.69 mg/dL (ref 0.50–1.10)
GFR, Est African American: >89 mL/min
GFR, Est Non African American: 89 mL/min
Glucose, Bld: 74 mg/dL (ref 70–99)
Potassium: 3.4 mEq/L — ABNORMAL LOW (ref 3.5–5.3)
Sodium: 136 mEq/L (ref 135–145)
Total Bilirubin: 0.8 mg/dL (ref 0.2–1.2)
Total Protein: 7.5 g/dL (ref 6.0–8.3)

## 2014-03-02 ENCOUNTER — Ambulatory Visit
Admission: RE | Admit: 2014-03-02 | Discharge: 2014-03-02 | Disposition: A | Discharge status: Home / Self Care | Payer: PRIVATE HEALTH INSURANCE | Source: Ambulatory Visit | Attending: Sports Medicine | Admitting: Sports Medicine

## 2014-03-02 DIAGNOSIS — M25512 Pain in left shoulder: Secondary | ICD-10-CM

## 2014-03-02 LAB — HIV-1 RNA QUANT-NO REFLEX-BLD
HIV 1 RNA Quant: <20 copies/mL (ref <20)
HIV-1 RNA Quant, Log: <1.3 {Log} (ref <1.30)

## 2014-03-02 LAB — RPR

## 2014-03-02 LAB — T-HELPER CELL (CD4) - (RCID CLINIC ONLY)
CD4 % Helper T Cell: 28 % — ABNORMAL LOW (ref 33–55)
CD4 T Cell Abs: 570 /uL (ref 400–2700)

## 2014-03-04 ENCOUNTER — Ambulatory Visit (INDEPENDENT_AMBULATORY_CARE_PROVIDER_SITE_OTHER): Payer: PRIVATE HEALTH INSURANCE | Admitting: Internal Medicine

## 2014-03-04 ENCOUNTER — Encounter: Payer: Self-pay | Admitting: Internal Medicine

## 2014-03-04 VITALS — BP 163/88 | HR 66 | Temp 97.0°F | Ht 62.0 in | Wt 136.5 lb

## 2014-03-04 DIAGNOSIS — R269 Unspecified abnormalities of gait and mobility: Secondary | ICD-10-CM

## 2014-03-04 DIAGNOSIS — M25519 Pain in unspecified shoulder: Secondary | ICD-10-CM

## 2014-03-04 DIAGNOSIS — I10 Essential (primary) hypertension: Secondary | ICD-10-CM

## 2014-03-04 DIAGNOSIS — M549 Dorsalgia, unspecified: Secondary | ICD-10-CM

## 2014-03-04 DIAGNOSIS — I2510 Atherosclerotic heart disease of native coronary artery without angina pectoris: Secondary | ICD-10-CM

## 2014-03-04 DIAGNOSIS — F17200 Nicotine dependence, unspecified, uncomplicated: Secondary | ICD-10-CM

## 2014-03-04 DIAGNOSIS — M25512 Pain in left shoulder: Secondary | ICD-10-CM

## 2014-03-04 MED ORDER — LISINOPRIL-HYDROCHLOROTHIAZIDE 20-12.5 MG PO TABS: 2.0000 | ORAL_TABLET | Freq: Every day | ORAL | Status: DC

## 2014-03-04 MED ORDER — GABAPENTIN 600 MG PO TABS: 600.0000 mg | ORAL_TABLET | Freq: Three times a day (TID) | ORAL | Status: DC

## 2014-03-04 MED ORDER — BACLOFEN 20 MG PO TABS: 20.0000 mg | ORAL_TABLET | Freq: Three times a day (TID) | ORAL | Status: DC

## 2014-03-04 MED ORDER — CLONIDINE HCL 0.3 MG/24HR TD PTWK: 0.3000 mg | MEDICATED_PATCH | TRANSDERMAL | Status: DC

## 2014-03-04 NOTE — Assessment & Plan Note (Signed)
No recent falls since getting the rolling walker with seat for stability.

## 2014-03-04 NOTE — Assessment & Plan Note (Addendum)
Recent labs from Monday normal and add clonidine 0.3 mg /day patch changed weekly for BP control with her amlodipine 10 mg, lisinopril/hctz 20/12.5 (2 pills for total of 40/25mg ) daily. Return soon (4-6 weeks) for BP recheck.

## 2014-03-04 NOTE — Patient Instructions (Signed)
We will start you on a patch to help with your blood pressure. Change the patch once per week. Come back in about 4-6 weeks to check on your blood pressure.  We will send you to a heart doctor to get checked before the surgery.   We have sent in a medicine called baclofen which works similar to flexeril. You can take 1 pill 3 times per day for pain.   Keep going back to Dr. Micheline Chapman to hear about options for your shoulder.   Call us with problems or questions at [phone].

## 2014-03-04 NOTE — Assessment & Plan Note (Signed)
Patient was not able to tolerate chantix due to bizarre behavior and is trying to cut back and is down to 1/2 PPD. She is still trying to reduce further but stress limits this ability.

## 2014-03-04 NOTE — Assessment & Plan Note (Signed)
Cannot start beta blocker due to resting HR 57 today and on previous EKG. Will refer to cardiology for evaluation prior to surgery given low MET capacity. ASA daily.

## 2014-03-04 NOTE — Assessment & Plan Note (Signed)
She will follow with Dr. Micheline Chapman with recent MRI with several tendon and biceps tears. Unclear if this warrants surgical evaluation or medical treatment. Baclofen rxed today to help with muscular pain. Had been on flexeril with good results in the past but insurance will not cover.

## 2014-03-04 NOTE — Progress Notes (Signed)
Subjective:     Patient ID: Nicole Mccormick, female   DOB: 1946-02-03, 68 y.o.   MRN: [id number]  HPI The patient is a 68 YO female who returns to the clinic for a routine follow up visit. She has PMH of HIV (last CD4 470 with viral load undetected), HTN, tobacco abuse, multiple falls, shoulder pain. She recently got an MRI of her shoulder and she is wondering if she should get further evaluation of her heart before then. She is not having chest pains currently but every once in awhile gets a twinge. She has had 1-2 heart attacks in the past but not seen a heart doctor in some time. She takes a baby aspirin daily. She is unable to walk up stairs due to balance. She is unable to even walk around the grocery store due to tiredness and SOB. She is still smoking 1 PPD.  She is not having fevers or chills.   Review of Systems  Constitutional: Negative for fever, chills, diaphoresis, activity change, appetite change, fatigue and unexpected weight change.  HENT: Negative for congestion.   Eyes: Negative.   Respiratory: Negative for cough, choking, chest tightness, shortness of breath, wheezing and stridor.   Cardiovascular: Negative for chest pain, palpitations and leg swelling.  Gastrointestinal: Negative.  Negative for nausea, vomiting, abdominal pain, diarrhea and constipation.  Musculoskeletal: Positive for arthralgias, gait problem and myalgias. Negative for joint swelling.       Some instability with walking, no cane with her but states that she uses a walker in the house.  Skin: Negative.   Neurological: Positive for weakness. Negative for dizziness, tremors, seizures, syncope, facial asymmetry, speech difficulty, light-headedness, numbness and headaches.       Objective:   Physical Exam  Constitutional: She is oriented to person, place, and time. She appears well-developed and well-nourished. No distress.  HENT:  Head: Normocephalic and atraumatic.  Eyes: Conjunctivae are normal. Pupils are  equal, round, and reactive to light.  Neck: Normal range of motion. Neck supple.  Cardiovascular: Normal rate and regular rhythm.   No murmur heard. Pulmonary/Chest: Effort normal and breath sounds normal. No respiratory distress. She has no wheezes. She has no rales. She exhibits no tenderness.  Abdominal: Soft. Bowel sounds are normal. She exhibits no distension and no mass. There is no tenderness. There is no rebound and no guarding.  Musculoskeletal: Normal range of motion. She exhibits tenderness. She exhibits no edema.  Walks short distances unassisted, left shoulder with pain and limited ROM.   Neurological: She is alert and oriented to person, place, and time.  Skin: Skin is warm and dry.  Psychiatric: She has a normal mood and affect.       Assessment/Plan:   1. Please see problem oriented charting.  2. Disposition - Refill provided for lisinopril/HCTZ, gabapentin, baclofen rxed as flexeril was not covered by her insurance. Start clonidine patch to be changed weekly for better BP control. Will refer to cardiology for evaluation and to establish but suspect may be due to smoking history and she would like to wait on PFTs until after cardiology evaluation. She did not get the shingles shot.

## 2014-03-07 ENCOUNTER — Ambulatory Visit (INDEPENDENT_AMBULATORY_CARE_PROVIDER_SITE_OTHER): Payer: PRIVATE HEALTH INSURANCE | Admitting: Internal Medicine

## 2014-03-07 ENCOUNTER — Ambulatory Visit (INDEPENDENT_AMBULATORY_CARE_PROVIDER_SITE_OTHER): Payer: PRIVATE HEALTH INSURANCE | Admitting: Sports Medicine

## 2014-03-07 ENCOUNTER — Encounter: Payer: Self-pay | Admitting: Sports Medicine

## 2014-03-07 ENCOUNTER — Encounter: Payer: Self-pay | Admitting: Internal Medicine

## 2014-03-07 VITALS — BP 224/84 | HR 51 | Temp 97.3°F

## 2014-03-07 VITALS — BP 239/100 | Ht 62.0 in | Wt 136.0 lb

## 2014-03-07 DIAGNOSIS — M6790 Unspecified disorder of synovium and tendon, unspecified site: Secondary | ICD-10-CM

## 2014-03-07 DIAGNOSIS — M67919 Unspecified disorder of synovium and tendon, unspecified shoulder: Secondary | ICD-10-CM | POA: Insufficient documentation

## 2014-03-07 DIAGNOSIS — M719 Bursopathy, unspecified: Secondary | ICD-10-CM

## 2014-03-07 DIAGNOSIS — I10 Essential (primary) hypertension: Secondary | ICD-10-CM

## 2014-03-07 NOTE — Patient Instructions (Signed)
We would like you to get the clonidine patches today. If the pharmacy cannot get them call our office back at [phone] and we will call in some pills that you can take until the patch is available.   We would like you to come back on Wednesday to check your blood pressure. Try to avoid ham and salty foods.   2 Gram Low Sodium Diet A 2 gram sodium diet restricts the amount of sodium in the diet to no more than 2 g or 2000 mg daily. Limiting the amount of sodium is often used to help lower blood pressure. It is important if you have heart, liver, or kidney problems. Many foods contain sodium for flavor and sometimes as a preservative. When the amount of sodium in a diet needs to be low, it is important to know what to look for when choosing foods and drinks. The following includes some information and guidelines to help make it easier for you to adapt to a low sodium diet. QUICK TIPS  Do not add salt to food.  Avoid convenience items and fast food.  Choose unsalted snack foods.  Buy lower sodium products, often labeled as "lower sodium" or "no salt added."  Check food labels to learn how much sodium is in 1 serving.  When eating at a restaurant, ask that your food be prepared with less salt or none, if possible. READING FOOD LABELS FOR SODIUM INFORMATION The nutrition facts label is a good place to find how much sodium is in foods. Look for products with no more than 500 to 600 mg of sodium per meal and no more than 150 mg per serving. Remember that 2 g = 2000 mg. The food label may also list foods as:  Sodium-free: Less than 5 mg in a serving.  Very low sodium: 35 mg or less in a serving.  Low-sodium: 140 mg or less in a serving.  Light in sodium: 50% less sodium in a serving. For example, if a food that usually has 300 mg of sodium is changed to become light in sodium, it will have 150 mg of sodium.  Reduced sodium: 25% less sodium in a serving. For example, if a food that usually  has 400 mg of sodium is changed to reduced sodium, it will have 300 mg of sodium. CHOOSING FOODS Grains  Avoid: Salted crackers and snack items. Some cereals, including instant hot cereals. Bread stuffing and biscuit mixes. Seasoned rice or pasta mixes.  Choose: Unsalted snack items. Low-sodium cereals, oats, puffed wheat and rice, shredded wheat. English muffins and bread. Pasta. Meats  Avoid: Salted, canned, smoked, spiced, pickled meats, including fish and poultry. Bacon, ham, sausage, cold cuts, hot dogs, anchovies.  Choose: Low-sodium canned tuna and salmon. Fresh or frozen meat, poultry, and fish. Dairy  Avoid: Processed cheese and spreads. Cottage cheese. Buttermilk and condensed milk. Regular cheese.  Choose: Milk. Low-sodium cottage cheese. Yogurt. Sour cream. Low-sodium cheese. Fruits and Vegetables  Avoid: Regular canned vegetables. Regular canned tomato sauce and paste. Frozen vegetables in sauces. Olives. Angie Fava. Relishes. Sauerkraut.  Choose: Low-sodium canned vegetables. Low-sodium tomato sauce and paste. Frozen or fresh vegetables. Fresh and frozen fruit. Condiments  Avoid: Canned and packaged gravies. Worcestershire sauce. Tartar sauce. Barbecue sauce. Soy sauce. Steak sauce. Ketchup. Onion, garlic, and table salt. Meat flavorings and tenderizers.  Choose: Fresh and dried herbs and spices. Low-sodium varieties of mustard and ketchup. Lemon juice. Tabasco sauce. Horseradish. SAMPLE 2 GRAM SODIUM MEAL PLAN Breakfast / Sodium (  mg)  1 cup low-fat milk / 147 mg  2 slices whole-wheat toast / 270 mg  1 tbs heart-healthy margarine / 153 mg  1 hard-boiled egg / 139 mg  1 small orange / 0 mg Lunch / Sodium (mg)  1 cup raw carrots / 76 mg   cup hummus / 298 mg  1 cup low-fat milk / 143 mg   cup red grapes / 2 mg  1 whole-wheat pita bread / 356 mg Dinner / Sodium (mg)  1 cup whole-wheat pasta / 2 mg  1 cup low-sodium tomato sauce / 73 mg  3 oz lean  ground beef / 57 mg  1 small side salad (1 cup raw spinach leaves,  cup cucumber,  cup yellow bell pepper) with 1 tsp olive oil and 1 tsp red wine vinegar / 25 mg Snack / Sodium (mg)  1 container low-fat vanilla yogurt / 107 mg  3 graham cracker squares / 127 mg Nutrient Analysis  Calories: 2033  Protein: 77 g  Carbohydrate: 282 g  Fat: 72 g  Sodium: 1971 mg Document Released: 11/25/2005 Document Revised: 02/17/2012 Document Reviewed: 02/26/2010 Exit[employer] Patient Information 2014 [place], [place].

## 2014-03-07 NOTE — Progress Notes (Signed)
Patient ID: Nicole Mccormick, female   DOB: 1946-02-01, 68 y.o.   MRN: [id number]  Patient comes in today for follow-up. She is here to discuss the MRI findings of her left shoulder. She is here today with her daughter. The MRI shows moderate rotator cuff tendinopathy as well as tendinopathy of the long head of the biceps tendon with a longitudinal split type tear. She also has degenerative labral tears and moderate synovitis. To date, she has failed conservative treatment including a subacromial and an intra-articular cortisone injection. I think we should refer her to one of the shoulder specialists to discuss the merits of arthroscopy. However, patient has a history of severe hypertension and coronary artery disease. Her primary care physician has asked for cardiac clearance prior to anything surgical. Therefore, I've asked the patient to contact me once she receives medical clearance. At that time, I will arrange for consultation with Dr. Mardelle Matte. In the meantime, her Tylenol No. 3 does seem to help with her pain. She was recently given a prescription for baclofen which is making her dizzy. I've asked her to cut the dose in half and if she continues to experience side effects from that medicine then she will contact the prescribing physician.  Also, her blood pressure was markedly elevated today. Patient and her daughter informed me that they will head directly to her PCPs office to make them aware.

## 2014-03-07 NOTE — Progress Notes (Signed)
Subjective:     Patient ID: Nicole Mccormick, female   DOB: 1946/01/21, 68 y.o.   MRN: [id number]  HPI The patient is a 68 YO female who returns to the clinic because her BP was elevated at her sports medicine visit. She was seen there about her shoulder and may need surgery. She was unable to get her blood pressure medicine yet although she has increased her lisinopril since last visit. She is not having headache, confusion, nausea, vomiting. She has PMH of HIV (last CD4 470 with viral load undetected), HTN, tobacco abuse, multiple falls, shoulder pain. She is still smoking 1 PPD.  She is not having fevers or chills. She has been eating a lot of ham in the last several days.   Review of Systems  Constitutional: Negative for fever, chills, diaphoresis, activity change, appetite change, fatigue and unexpected weight change.  HENT: Negative for congestion.   Eyes: Negative.   Respiratory: Negative for cough, choking, chest tightness, shortness of breath, wheezing and stridor.   Cardiovascular: Negative for chest pain, palpitations and leg swelling.  Gastrointestinal: Negative.  Negative for nausea, vomiting, abdominal pain, diarrhea and constipation.  Musculoskeletal: Positive for arthralgias, gait problem and myalgias. Negative for joint swelling.       Some instability with walking, no cane with her but states that she uses a walker in the house.  Skin: Negative.   Neurological: Positive for weakness. Negative for dizziness, tremors, seizures, syncope, facial asymmetry, speech difficulty, light-headedness, numbness and headaches.       Objective:   Physical Exam  Constitutional: She is oriented to person, place, and time. She appears well-developed and well-nourished. No distress.  HENT:  Head: Normocephalic and atraumatic.  Eyes: Conjunctivae are normal. Pupils are equal, round, and reactive to light.  Neck: Normal range of motion. Neck supple.  Cardiovascular: Normal rate and regular rhythm.    No murmur heard. Pulmonary/Chest: Effort normal and breath sounds normal. No respiratory distress. She has no wheezes. She has no rales. She exhibits no tenderness.  Abdominal: Soft. Bowel sounds are normal. She exhibits no distension and no mass. There is no tenderness. There is no rebound and no guarding.  Musculoskeletal: Normal range of motion. She exhibits tenderness. She exhibits no edema.  Walks short distances unassisted, left shoulder with pain and limited ROM.   Neurological: She is alert and oriented to person, place, and time.  Skin: Skin is warm and dry.  Psychiatric: She has a normal mood and affect.       Assessment/Plan:   1. Blood pressure - Patient was advised to fill her clonidine prescription and to continue with her current regimen until then. No signs of hypertensive urgency or emergency. She will return on Wednesday for BP recheck and if still elevated would be worth getting a renin aldosterone ratio for workup of secondary hypertension as her daughter confirms good compliance with her meds.   2. Disposition - Advised her to fill clonidine patch and continue with regimen. Will be seen back Wednesday. If BP still elevated needs titration of therapy and workup of secondary causes with renin to aldosterone ratio. She has had hypokalemia in the past.

## 2014-03-07 NOTE — Progress Notes (Signed)
Case discussed with Dr. Doug Sou at the time of the visit.  We reviewed the resident's history and exam and pertinent patient test results.  I agree with the assessment, diagnosis and plan of care documented in the resident's note.  If blood pressure remains difficult to control on maximal doses of 4 antihypertensives she would be a candidate for assessment for secondary hypertension. If it gets tot hat point I would start with an aldosterone level and plasma renin activity.

## 2014-03-07 NOTE — Assessment & Plan Note (Addendum)
Patient unable to fill clonidine yet and will do that today. BP elevated at 224/84 today. She is on good regimen with good compliance per her daughter and herself. At follow up Wednesday recommend obtaining renin to aldosterone ratio for workup of secondary hypertension. No signs of renovascular disease or other risks for PVD. She has been eating ham more lately and talked to her about low sodium diet. She is also in severe pain from her shoulder although BPs have never really been controlled in the past.

## 2014-03-07 NOTE — Progress Notes (Signed)
Case discussed with Dr. Kollar soon after the resident saw the patient.  We reviewed the resident's history and exam and pertinent patient test results.  I agree with the assessment, diagnosis, and plan of care documented in the resident's note. 

## 2014-03-08 ENCOUNTER — Ambulatory Visit (HOSPITAL_COMMUNITY)
Admission: RE | Admit: 2014-03-08 | Discharge: 2014-03-08 | Disposition: A | Discharge status: Home / Self Care | Payer: PRIVATE HEALTH INSURANCE | Source: Ambulatory Visit | Attending: Internal Medicine | Admitting: Internal Medicine

## 2014-03-08 ENCOUNTER — Telehealth: Payer: Self-pay | Admitting: *Deleted

## 2014-03-08 ENCOUNTER — Inpatient Hospital Stay (HOSPITAL_COMMUNITY): Payer: PRIVATE HEALTH INSURANCE

## 2014-03-08 ENCOUNTER — Ambulatory Visit (INDEPENDENT_AMBULATORY_CARE_PROVIDER_SITE_OTHER): Payer: PRIVATE HEALTH INSURANCE | Admitting: Internal Medicine

## 2014-03-08 ENCOUNTER — Encounter: Payer: Self-pay | Admitting: Internal Medicine

## 2014-03-08 ENCOUNTER — Inpatient Hospital Stay (HOSPITAL_COMMUNITY)
Admission: AD | Admit: 2014-03-08 | Discharge: 2014-03-10 | DRG: 305 | Disposition: A | Discharge status: Home / Self Care | Payer: PRIVATE HEALTH INSURANCE | Source: Ambulatory Visit | Attending: Internal Medicine | Admitting: Internal Medicine

## 2014-03-08 VITALS — BP 202/82 | HR 52 | Temp 97.7°F | Ht 62.0 in | Wt 132.2 lb

## 2014-03-08 DIAGNOSIS — F17200 Nicotine dependence, unspecified, uncomplicated: Secondary | ICD-10-CM | POA: Diagnosis present

## 2014-03-08 DIAGNOSIS — I509 Heart failure, unspecified: Secondary | ICD-10-CM | POA: Diagnosis present

## 2014-03-08 DIAGNOSIS — Z91199 Patient's noncompliance with other medical treatment and regimen due to unspecified reason: Secondary | ICD-10-CM

## 2014-03-08 DIAGNOSIS — Z881 Allergy status to other antibiotic agents status: Secondary | ICD-10-CM

## 2014-03-08 DIAGNOSIS — Z880 Allergy status to penicillin: Secondary | ICD-10-CM

## 2014-03-08 DIAGNOSIS — R569 Unspecified convulsions: Secondary | ICD-10-CM | POA: Diagnosis present

## 2014-03-08 DIAGNOSIS — Z21 Asymptomatic human immunodeficiency virus [HIV] infection status: Secondary | ICD-10-CM | POA: Diagnosis present

## 2014-03-08 DIAGNOSIS — B1920 Unspecified viral hepatitis C without hepatic coma: Secondary | ICD-10-CM | POA: Diagnosis present

## 2014-03-08 DIAGNOSIS — Z833 Family history of diabetes mellitus: Secondary | ICD-10-CM

## 2014-03-08 DIAGNOSIS — Z7982 Long term (current) use of aspirin: Secondary | ICD-10-CM

## 2014-03-08 DIAGNOSIS — I10 Essential (primary) hypertension: Principal | ICD-10-CM | POA: Diagnosis present

## 2014-03-08 DIAGNOSIS — I160 Hypertensive urgency: Secondary | ICD-10-CM

## 2014-03-08 DIAGNOSIS — Z79899 Other long term (current) drug therapy: Secondary | ICD-10-CM

## 2014-03-08 DIAGNOSIS — Z8041 Family history of malignant neoplasm of ovary: Secondary | ICD-10-CM

## 2014-03-08 DIAGNOSIS — B20 Human immunodeficiency virus [HIV] disease: Secondary | ICD-10-CM | POA: Diagnosis present

## 2014-03-08 DIAGNOSIS — M25512 Pain in left shoulder: Secondary | ICD-10-CM | POA: Diagnosis present

## 2014-03-08 DIAGNOSIS — I498 Other specified cardiac arrhythmias: Secondary | ICD-10-CM | POA: Diagnosis present

## 2014-03-08 DIAGNOSIS — Z9119 Patient's noncompliance with other medical treatment and regimen: Secondary | ICD-10-CM

## 2014-03-08 DIAGNOSIS — R279 Unspecified lack of coordination: Secondary | ICD-10-CM | POA: Diagnosis present

## 2014-03-08 DIAGNOSIS — B182 Chronic viral hepatitis C: Secondary | ICD-10-CM

## 2014-03-08 DIAGNOSIS — K219 Gastro-esophageal reflux disease without esophagitis: Secondary | ICD-10-CM | POA: Diagnosis present

## 2014-03-08 DIAGNOSIS — I2510 Atherosclerotic heart disease of native coronary artery without angina pectoris: Secondary | ICD-10-CM | POA: Diagnosis present

## 2014-03-08 DIAGNOSIS — R011 Cardiac murmur, unspecified: Secondary | ICD-10-CM | POA: Diagnosis present

## 2014-03-08 DIAGNOSIS — E876 Hypokalemia: Secondary | ICD-10-CM | POA: Diagnosis present

## 2014-03-08 LAB — BASIC METABOLIC PANEL WITH GFR
BUN: 23 mg/dL (ref 6–23)
CO2: 32 mEq/L (ref 19–32)
Calcium: 9.6 mg/dL (ref 8.4–10.5)
Chloride: 102 mEq/L (ref 96–112)
Creat: 0.71 mg/dL (ref 0.50–1.10)
GFR, Est African American: >89 mL/min
GFR, Est Non African American: 87 mL/min
Glucose, Bld: 99 mg/dL (ref 70–99)
Potassium: 4.2 mEq/L (ref 3.5–5.3)
Sodium: 137 mEq/L (ref 135–145)

## 2014-03-08 LAB — CBC WITH DIFFERENTIAL/PLATELET
Basophils Absolute: 0 10*3/uL (ref 0.0–0.1)
Basophils Relative: 1 % (ref 0–1)
Eosinophils Absolute: 0.1 10*3/uL (ref 0.0–0.7)
Eosinophils Relative: 2 % (ref 0–5)
HCT: 40.1 % (ref 36.0–46.0)
Hemoglobin: 13.7 g/dL (ref 12.0–15.0)
Lymphocytes Relative: 43 % (ref 12–46)
Lymphs Abs: 2.1 10*3/uL (ref 0.7–4.0)
MCH: 34.9 pg — ABNORMAL HIGH (ref 26.0–34.0)
MCHC: 34.2 g/dL (ref 30.0–36.0)
MCV: 102 fL — ABNORMAL HIGH (ref 78.0–100.0)
Monocytes Absolute: 0.6 10*3/uL (ref 0.1–1.0)
Monocytes Relative: 12 % (ref 3–12)
Neutro Abs: 2 10*3/uL (ref 1.7–7.7)
Neutrophils Relative %: 42 % — ABNORMAL LOW (ref 43–77)
Platelets: 229 10*3/uL (ref 150–400)
RBC: 3.93 MIL/uL (ref 3.87–5.11)
RDW: 12.6 % (ref 11.5–15.5)
WBC: 4.8 10*3/uL (ref 4.0–10.5)

## 2014-03-08 LAB — COMPREHENSIVE METABOLIC PANEL
ALT: 44 U/L — ABNORMAL HIGH (ref 0–35)
AST: 42 U/L — ABNORMAL HIGH (ref 0–37)
Albumin: 3.5 g/dL (ref 3.5–5.2)
Alkaline Phosphatase: 70 U/L (ref 39–117)
BUN: 18 mg/dL (ref 6–23)
CO2: 26 mEq/L (ref 19–32)
Calcium: 9 mg/dL (ref 8.4–10.5)
Chloride: 102 mEq/L (ref 96–112)
Creatinine, Ser: 0.58 mg/dL (ref 0.50–1.10)
GFR calc Af Amer: >90 mL/min (ref >90)
GFR calc non Af Amer: >90 mL/min (ref >90)
Glucose, Bld: 88 mg/dL (ref 70–99)
Potassium: 3.3 mEq/L — ABNORMAL LOW (ref 3.7–5.3)
Sodium: 142 mEq/L (ref 137–147)
Total Bilirubin: 0.5 mg/dL (ref 0.3–1.2)
Total Protein: 7.8 g/dL (ref 6.0–8.3)

## 2014-03-08 LAB — TSH: TSH: 0.771 u[IU]/mL (ref 0.350–4.500)

## 2014-03-08 LAB — MRSA PCR SCREENING: MRSA by PCR: NEGATIVE

## 2014-03-08 LAB — MAGNESIUM: Magnesium: 2 mg/dL (ref 1.5–2.5)

## 2014-03-08 LAB — TROPONIN I: Troponin I: <0.3 ng/mL (ref <0.30)

## 2014-03-08 MED ORDER — ATAZANAVIR SULFATE 200 MG PO CAPS
400.0000 mg | ORAL_CAPSULE | Freq: Every day | ORAL | Status: DC
  Administered 2014-03-09 – 2014-03-10 (×2): 400 mg via ORAL
  Filled 2014-03-08 (×3): qty 2

## 2014-03-08 MED ORDER — AMLODIPINE BESYLATE 10 MG PO TABS
10.0000 mg | ORAL_TABLET | Freq: Every day | ORAL | Status: DC
  Administered 2014-03-09 – 2014-03-10 (×2): 10 mg via ORAL
  Filled 2014-03-08 (×2): qty 1

## 2014-03-08 MED ORDER — EMTRICITABINE-TENOFOVIR DF 200-300 MG PO TABS
1.0000 | ORAL_TABLET | Freq: Every day | ORAL | Status: DC
  Administered 2014-03-09 – 2014-03-10 (×2): 1 via ORAL
  Filled 2014-03-08 (×3): qty 1

## 2014-03-08 MED ORDER — SODIUM CHLORIDE 0.9 % IJ SOLN: 3.0000 mL | INTRAMUSCULAR | Status: DC | PRN

## 2014-03-08 MED ORDER — ACETAMINOPHEN-CODEINE #3 300-30 MG PO TABS: 1.0000 | ORAL_TABLET | Freq: Three times a day (TID) | ORAL | Status: DC | PRN

## 2014-03-08 MED ORDER — HYDRALAZINE HCL 20 MG/ML IJ SOLN
10.0000 mg | Freq: Four times a day (QID) | INTRAMUSCULAR | Status: DC | PRN
  Administered 2014-03-08 (×2): 5 mg via INTRAVENOUS
  Filled 2014-03-08: qty 1

## 2014-03-08 MED ORDER — SODIUM CHLORIDE 0.9 % IV SOLN: 250.0000 mL | INTRAVENOUS | Status: DC | PRN

## 2014-03-08 MED ORDER — SODIUM CHLORIDE 0.9 % IJ SOLN
3.0000 mL | Freq: Two times a day (BID) | INTRAMUSCULAR | Status: DC
  Administered 2014-03-09: 3 mL via INTRAVENOUS

## 2014-03-08 MED ORDER — SODIUM CHLORIDE 0.9 % IJ SOLN
3.0000 mL | Freq: Two times a day (BID) | INTRAMUSCULAR | Status: DC
  Administered 2014-03-08 – 2014-03-09 (×3): 3 mL via INTRAVENOUS

## 2014-03-08 MED ORDER — GABAPENTIN 600 MG PO TABS
600.0000 mg | ORAL_TABLET | Freq: Three times a day (TID) | ORAL | Status: DC
  Administered 2014-03-08 – 2014-03-10 (×5): 600 mg via ORAL
  Filled 2014-03-08 (×7): qty 1

## 2014-03-08 MED ORDER — HEPARIN SODIUM (PORCINE) 5000 UNIT/ML IJ SOLN
5000.0000 [IU] | Freq: Three times a day (TID) | INTRAMUSCULAR | Status: DC
  Administered 2014-03-09 – 2014-03-10 (×4): 5000 [IU] via SUBCUTANEOUS
  Filled 2014-03-08 (×8): qty 1

## 2014-03-08 MED ORDER — CLONIDINE HCL 0.3 MG/24HR TD PTWK: 0.3000 mg | MEDICATED_PATCH | TRANSDERMAL | Status: DC

## 2014-03-08 MED ORDER — AMLODIPINE BESYLATE 10 MG PO TABS
10.0000 mg | ORAL_TABLET | Freq: Every day | ORAL | Status: DC
  Filled 2014-03-08: qty 1

## 2014-03-08 NOTE — Assessment & Plan Note (Signed)
Sent off BMP and renin to aldosterone ratio at today's visit. She will be admitted to the hospital in step down level care due to concern for hypertensive encephalopathy. EKG performed and without acute changes from prior.

## 2014-03-08 NOTE — Assessment & Plan Note (Addendum)
Well controlled with last CD4 570 and last viral load undetected. She takes truvada, Programme researcher, broadcasting/film/video.

## 2014-03-08 NOTE — H&P (Signed)
Date: 03/09/2014               Patient Name:  Nicole Mccormick MRN: [id number]  DOB: 12-08-1946 Age / Sex: 68 y.o., female   PCP: [name], MD         Medical Service: Internal Medicine Teaching Service         Attending Physician: Dr. [name], MD    First Contact: Dr. Lucila Maine Pager: [phone]  Second Contact: Dr. Eulas Post Pager: [phone]       After Hours (After 5p/  First Contact Pager: [phone]  weekends / holidays): Second Contact Pager: [phone]   Chief Complaint: Elevated blood pressure and headaches   History of Present Illness: 46 y o F with PMH of CAD, HIV and HTN. Presented today with c/o of Elevated blood pressure measured at home- 272/112. Pt reports that her blood pressure is usually elevated at home, systolic in the 578I-696E range, despite compliance with her medication- Amlodipine, Lisinopril-HCTZ, and clonidnine started yesterday when she came to clinic.  Headaches started last night at about 1am, said it started like something soft hitting her head, intermittent like every minute, initially very mild, and gradually increased in severity. Patient also reported being nauseous, started yesterday and had an episode of vomiting- clear liqud,non bilous, non bloody, and only one occurrence yesterday.  Pt vaguely reports that she started seeing something silvery flash across her eyes today while on admission, one occurrence, no prior episodes, but vision presently clear, No falls, LOC or confusion, no fever or neck pain, no back pain, no change in urine, no difficulty breathing, or no leg swelling.   Prescriptions prior to admission  Medication Sig Dispense Refill  . acetaminophen-codeine (TYLENOL #3) 300-30 MG per tablet Take 1 tablet by mouth every 8 (eight) hours as needed for moderate pain or severe pain.  30 tablet  0  . acyclovir (ZOVIRAX) 400 MG tablet Take 400 mg by mouth as needed.      Marland Kitchen amLODipine (NORVASC) 10 MG tablet Take 1 tablet (10 mg total) by mouth  daily.  30 tablet  11  . aspirin EC 81 MG tablet Take 1 tablet (81 mg total) by mouth daily.  30 tablet  6  . atazanavir (REYATAZ) 200 MG capsule take 2 capsules by mouth once daily **SPACED 12 HOURS FROM ANTACIDS**  60 capsule  6  . baclofen (LIORESAL) 20 MG tablet Take 1 tablet (20 mg total) by mouth 3 (three) times daily.  90 each  0  . cloNIDine (CATAPRES - DOSED IN MG/24 HR) 0.3 mg/24hr patch Place 1 patch (0.3 mg total) onto the skin every 7 (seven) days.  4 patch  2  . emtricitabine-tenofovir (TRUVADA) 200-300 MG per tablet Take 1 tablet by mouth daily.  30 tablet  6  . gabapentin (NEURONTIN) 600 MG tablet Take 1 tablet (600 mg total) by mouth 3 (three) times daily.  90 tablet  3  . lisinopril-hydrochlorothiazide (PRINZIDE,ZESTORETIC) 20-12.5 MG per tablet Take 2 tablets by mouth daily.  60 tablet  6  . Nutritional Supplements (ENSURE NUTRA SHAKE HI-CAL) LIQD Take 1 Can by mouth 2 (two) times daily.  60 Can  6  . sucralfate (CARAFATE) 1 G tablet Take 1 tablet (1 g total) by mouth 4 (four) times daily.  120 tablet  0     Meds: Current Facility-Administered Medications  Medication Dose Route Frequency Provider Last Rate Last Dose  . 0.9 %  sodium chloride infusion  250 mL  Intravenous PRN Na Nicoletta Dress, MD      . acetaminophen-codeine (TYLENOL #3) 300-30 MG per tablet 1 tablet  1 tablet Oral Q8H PRN Na Nicoletta Dress, MD      . amLODipine (NORVASC) tablet 10 mg  10 mg Oral Daily Na Li, MD      . atazanavir (REYATAZ) capsule 400 mg  400 mg Oral Q breakfast Na Nicoletta Dress, MD      . Derrill Memo ON 03/14/2014] cloNIDine (CATAPRES - Dosed in mg/24 hr) patch 0.3 mg  0.3 mg Transdermal Q Mon Charlann Lange, MD      . emtricitabine-tenofovir (TRUVADA) 200-300 MG per tablet 1 tablet  1 tablet Oral Daily Na Li, MD      . gabapentin (NEURONTIN) tablet 600 mg  600 mg Oral TID Charlann Lange, MD   600 mg at 03/08/14 2203  . heparin injection 5,000 Units  5,000 Units Subcutaneous 3 times per day Na Li, MD      . sodium chloride 0.9 % injection 3 mL  3 mL  Intravenous Q12H Na Li, MD   3 mL at 03/08/14 2203  . sodium chloride 0.9 % injection 3 mL  3 mL Intravenous Q12H Na Li, MD      . sodium chloride 0.9 % injection 3 mL  3 mL Intravenous PRN Charlann Lange, MD        Allergies: Allergies as of 03/08/2014 - Review Complete 03/08/2014  Allergen Reaction Noted  . Penicillins  01/26/2007  . Avelox [moxifloxacin hcl in nacl] Itching and Rash 10/13/2011   Past Medical History  Diagnosis Date  . Hypertension   . Abnormal Pap smear   . Asthma   . Coronary artery disease   . HIV infection   . Varicosities   . Seizures     last sz fri nov 2  . GERD (gastroesophageal reflux disease)     previously on aciphex, discontinued november 2012  because patient asymptomatic, and concern about interference with HIV meds.  May try pepcid in the future if symptoms return  . Iron deficiency anemia     Ferritin = 2 in november 2012, started on iron supplemenation  . Arthritis   . CHF (congestive heart failure)   . Heart murmur    Past Surgical History  Procedure Laterality Date  . Abdominal hysterectomy    . Esophagogastroduodenoscopy  10/13/11    small hiatal hernia  . Colonoscopy  10/13/11    small adenoma, anal condyloma  . Colonoscopy  10/13/2011    Procedure: COLONOSCOPY;  Surgeon: Gatha Mayer, MD;  Location: [place];  Service: Endoscopy;  Laterality: N/A;  . Esophagogastroduodenoscopy  10/13/2011    Procedure: ESOPHAGOGASTRODUODENOSCOPY (EGD);  Surgeon: Gatha Mayer, MD;  Location: [facility] ENDOSCOPY;  Service: Endoscopy;  Laterality: N/A;   Family History  Problem Relation Age of Onset  . Diabetes Mother   . Ovarian cancer Sister   . Cancer Sister     ovarian and colon  . Colon cancer Neg Hx    History   Social History  . Marital Status: Single    Spouse Name: N/A    Number of Children: N/A  . Years of Education: N/A   Occupational History  . Not on file.   Social History Main Topics  . Smoking status: Current Every Day Smoker --  1.00 packs/day for 50 years    Types: Cigarettes  . Smokeless tobacco: Never Used     Comment: working on it.  . Alcohol Use: 1.2  oz/week    2 Cans of beer per week     Comment: occasional beer; sometimes a glass of wine.  . Drug Use: No  . Sexual Activity: Not on file     Comment: pt. given condoms   Other Topics Concern  . Not on file   Social History Narrative  . No narrative on file    Review of Systems: CONSTITUTIONAL- No Fever, weightloss, night sweat, or change in appetite. SKIN- No Rash, colour changes,or  itching. Mouth/throat- No Sorethroat, wears dentures, no bleeding gums. RESPIRATORY- Chronic Cough, complaints of some DOE, cant tell duration, but chronic.Marland Kitchen CARDIAC- No Palpitations, vague complaints of chest pain and dizziness, chronic, sometimes present. GI- No Dysphagia, no diarrhoea, constipation, no abd pain. Musculoskeletal- Pain in Left shoulder joint, sustained from fall last year, Unable to walk due to fatigue and balance problems- chronic issue, uses a walker at home, pt says it has been worse today.  Physical Exam: Blood pressure 162/70, pulse 46, temperature 97.5 F (36.4 C), temperature source Oral, resp. rate 12, height 5\' 2"  (1.575 m), weight 132 lb 3.2 oz (59.966 kg), SpO2 96.00%. GENERAL- alert, co-operative, lying in bed comfortably, appears as stated age, not in any distress. HEENT- Atraumatic, normocephalic, PERRL, EOMI, oral mucosa appears moist, wears dentures, No carotid bruit, no cervical LN enlargement, thyroid does not appear enlarged. CARDIAC- Bradycardic, regular, Grade 3 systolic murmur heard best in aortic area radiating to carotids, no rubs or gallops, marked diffuse tenderness to palpation- Left chest. RESP- Moving equal volumes of air, and clear to auscultation bilaterally- no crackles or wheezes. ABDOMEN- Soft, non tender, no palpable masses or organomegaly, bowel sounds present. BACK- Normal curvature of the spine, No tenderness along  the vertebrae, no CVA tenderness. NEURO- Cr N 2-12 intact, strenght 5/5 and equal in all extremities, DTR- Patella and biceps Normal- 2+, Gait- wobbly. EXTREMITIES- pulse 2+, symmetric, no pedal edema. SKIN- Warm, dry, No rash or lesion. PSYCH- Normal mood and affect, appropriate thought content and speech.  Lab results: Basic Metabolic Panel:  Recent Labs  03/08/14 2215 03/09/14 0136  NA 142 142  K 3.3* 3.4*  CL 102 103  CO2 26 27  GLUCOSE 88 98  BUN 18 17  CREATININE 0.58 0.58  CALCIUM 9.0 8.9  MG 2.0  --    Liver Function Tests:  Recent Labs  03/08/14 2215  AST 42*  ALT 44*  ALKPHOS 70  BILITOT 0.5  PROT 7.8  ALBUMIN 3.5   No results found for this basename: LIPASE, AMYLASE,  in the last 72 hours No results found for this basename: AMMONIA,  in the last 72 hours CBC:  Recent Labs  03/08/14 2215 03/09/14 0136  WBC 4.8 5.8  NEUTROABS 2.0  --   HGB 13.7 13.5  HCT 40.1 38.9  MCV 102.0* 102.1*  PLT 229 216   Cardiac Enzymes:  Recent Labs  03/08/14 2212 03/09/14 0136  TROPONINI <0.30 <0.30   BNP: No results found for this basename: PROBNP,  in the last 72 hours D-Dimer: No results found for this basename: DDIMER,  in the last 72 hours CBG: No results found for this basename: GLUCAP,  in the last 72 hours Hemoglobin A1C: No results found for this basename: HGBA1C,  in the last 72 hours Fasting Lipid Panel: No results found for this basename: CHOL, HDL, LDLCALC, TRIG, CHOLHDL, LDLDIRECT,  in the last 72 hours Thyroid Function Tests:  Recent Labs  03/08/14 2212  TSH 0.771  Anemia Panel: No results found for this basename: VITAMINB12, FOLATE, FERRITIN, TIBC, IRON, RETICCTPCT,  in the last 72 hours Coagulation: No results found for this basename: LABPROT, INR,  in the last 72 hours Urine Drug Screen: Drugs of Abuse     Component Value Date/Time   LABOPIA NEGATIVE 12/26/2010 [place] 12/26/2010 [place]  12/26/2010 [place] 12/26/2010 0137    Alcohol Level: No results found for this basename: ETH,  in the last 72 hours Urinalysis: No results found for this basename: COLORURINE, APPERANCEUR, LABSPEC, PHURINE, GLUCOSEU, HGBUR, BILIRUBINUR, KETONESUR, PROTEINUR, UROBILINOGEN, NITRITE, LEUKOCYTESUR,  in the last 72 hours Misc. Labs:   Imaging results:  X-ray Chest Pa And Lateral   03/09/2014   CLINICAL DATA:  Cough at admission  EXAM: CHEST  2 VIEW  COMPARISON:  01/01/2013  FINDINGS: Borderline cardiomegaly, stable from prior. No edema or consolidation. No effusion or pneumothorax. Mild pulmonary hyperinflation. Atherosclerotic aortic calcification, likely affecting the coronary arteries as well. Chronic, mild upper thoracic vertebral body wedging.  IMPRESSION: No active cardiopulmonary disease.   Electronically Signed   By: [name] M.D.   On: 03/09/2014 00:07   Ct Head Wo Contrast  03/08/2014   CLINICAL DATA:  Elevated blood pressure.  EXAM: CT HEAD WITHOUT CONTRAST  TECHNIQUE: Contiguous axial images were obtained from the base of the skull through the vertex without intravenous contrast.  COMPARISON:  None.  FINDINGS: Skull and Sinuses:No significant abnormality.  Orbits: Right cataract resection.  Brain: No evidence of acute abnormality, such as acute infarction, hemorrhage, hydrocephalus, or mass lesion/mass effect. There is chronic small vessel disease with extensive bilateral cerebral white matter low density, confluent around the frontal horns of the lateral ventricles. Low attenuating structures in the bilateral putamina are thus likely lacunes (rather than dilated perivascular spaces).  IMPRESSION: 1. No evidence of acute intracranial disease. 2. Chronic small vessel disease.   Electronically Signed   By: [name] M.D.   On: 03/08/2014 22:48    Other results: EKG: rate- 53 bpm, Left axis deviation, left ventricular hypertrophy, left atrial enlargement, QRS within  normal limits, some change from prior, presently has features concerning for left fascicular block-- Left axis deviation,  rS changes in lead 2 not previously present(EKG- 03/16/2013) qR waves in aVL, with normal QRS duration.  Assessment & Plan by Problem:  Hypertensive Urgency- Presenting blood pressure- 221/75, no evidence of end organ damage. Might have early stages of hypertensive encephalopathy- With marked elevation in BP,headaches, and vomiting but no Altered mental status/confusion. Also no evidence of End organ damage- Heart- though reports chronic DOE(possibly due to deconditioning), no evidence of fluid overload, no new chest pain, and trops are negative,  kidneys- Bmet WNL and UA Pending, brain- Ct scan neg for hemorrhage. - Admit to step down - Head Ct- considering presence of nausea, vomiting ?worsening abnormal gait, and marked elevation in BP. - Hydralazine- 5-10 mg IV Q6H PRN for BP >190/100, later discont. - Discontinued Clonidine patch - 0.3mg , as pt blood pressure initially dropped to 130s after hydralazine 5mg  was given. Goal BP- 170s- 190s. - Neuro checks Q2H, as there was concern for a stroke, given marked reduction in blood pressure. - Troponin X3. - Chest Xray PA and Lat - TSH- to rule out hyperthyroidism as a cause of HTN - Aldosterone/renin ratio pending. - UA to access for end organ damage. - PT/OT- hx of falls.  Murmur in aortic area-Grade 3  systolic murmur, concern for aortic stenosis, considering vague complaints of chest pain, dizziness and also complaints of DOE, but reports stable two pillow orthopnea for years, last Echo- 2012 revealed mild LV outflow tract gradient, due to vigorus ventricular contraction, which likely accounts for pts murmur. EF- 65-70%. - Consider Echo  - Repeat EKG in the AM in the light of EKG findings, possible due to marked elevation in blood pressure, if EKG shows persistent changes, consider Cards consult- for Left Fascicular block with rS  changes in lead 2 not previously present(EKG- 03/16/2013) qR waves in aVL, with normal QRS duration.  Hypokalemia- K- 3.4,  - 41meq of KCL. - Follow Bmet in AM.  HIV- Patient sees Dr Johnnye Sima, Last CD4 count-03/01/2014 570, viral load- <20. Compliant with meds- Truvada, and atazanavir. - Continue meds on admission.  Carrier Viral Hep C- Appears stable. CMP- revealed mild elevation in AST and ALt, normal ALP. - Continue to monitor.  DVT PPX- Heparin  Dispo: Disposition is deferred at this time, awaiting improvement of current medical problems.  The patient does have a current PCP [name], MD) and does need an [facility] hospital follow-up appointment after discharge.  The patient does not know have transportation limitations that hinder transportation to clinic appointments.  Signed: Jenetta Downer, MD 03/09/2014, 2:52 AM

## 2014-03-08 NOTE — Telephone Encounter (Signed)
Talked with Dr Stann Mainland and he said pt can be seen in clinic if open slot or to ED. I called pt and she prefers clinic visit, she is more comfortable here. Dr Doug Sou informed. Pt will come over now for 2:30 appointment.

## 2014-03-08 NOTE — Patient Instructions (Signed)
We are going to admit you to the hospital and will care for your blood pressure there.

## 2014-03-08 NOTE — Progress Notes (Signed)
Subjective:     Patient ID: Nicole Mccormick, female   DOB: [birth year], 68 y.o.   MRN: [id number]  HPI The patient is a 68 YO female who returns to the clinic because her BP was elevated at home. She started the clonidine patch yesterday and since that time has vomited once, been nauseous and unable to eat well. She is also having some balance issues with walking. She is having headaches and states now that there is a pounding in her head.  She does not admit to confusion and her two daughters that are with her do not contribute to the conversation. She has tried to follow the low sodium diet since yesterday although states that the food tasted like nothing and she couldn't eat it. She has PMH of HIV (last CD4 570 with viral load undetected), HTN, tobacco abuse, multiple falls, shoulder pain. She is still smoking 1 PPD.  She is not having fevers or chills. She did eat ham Sunday and Monday.   Review of Systems  Constitutional: Positive for activity change and fatigue. Negative for fever, chills, diaphoresis, appetite change and unexpected weight change.  HENT: Negative for congestion.   Eyes: Negative.   Respiratory: Negative for cough, choking, chest tightness, shortness of breath, wheezing and stridor.   Cardiovascular: Negative for chest pain, palpitations and leg swelling.  Gastrointestinal: Positive for nausea and vomiting. Negative for abdominal pain, diarrhea and constipation.  Musculoskeletal: Positive for arthralgias, gait problem and myalgias. Negative for joint swelling.       Unable to walk due to fatigue and balance problems, in wheelchair today.  Skin: Negative.   Neurological: Positive for weakness and headaches. Negative for dizziness, tremors, seizures, syncope, facial asymmetry, speech difficulty, light-headedness and numbness.       Also balance disrtubance.       Objective:   Physical Exam  Constitutional: She is oriented to person, place, and time. She appears well-developed and  well-nourished. She appears distressed.  In mild to moderate distress from headache and originally lying on bed when I enter  HENT:  Head: Normocephalic and atraumatic.  Eyes: EOM are normal. Pupils are equal, round, and reactive to light.  Neck: Normal range of motion. Neck supple.  Cardiovascular: Normal rate and regular rhythm.   No murmur heard. Pulmonary/Chest: Effort normal and breath sounds normal. No respiratory distress. She has no wheezes. She has no rales. She exhibits no tenderness.  Abdominal: Soft. Bowel sounds are normal. She exhibits no distension and no mass. There is no tenderness. There is no rebound and no guarding.  Musculoskeletal: Normal range of motion. She exhibits tenderness. She exhibits no edema.  Neurological: She is alert and oriented to person, place, and time.  Skin: Skin is warm and dry.  Psychiatric: She has a normal mood and affect.  Slightly slowed cognition over previous visits       Assessment/Plan:   1. Blood pressure - Patient filled clonidine and is still taking lisinopril/HCTZ and norvasc.Unfortunately unclear if these are related to her high blood pressure or the medication. Feel that the nausea and headache could be early signs of hypertensive encephalopathy and will admit to SDU. Given lack of beds will plan for transfer to the ED if unable to get a bed before the clinic closes. Have called admission team and they will attempt to see patient in the clinic prior to transfer.   2. Disposition - She will be admitted to the step down unit for presumed hypertensive encephalopathy with balance  disturbance, headache, nausea, 1 episode of vomiting last night.

## 2014-03-08 NOTE — Telephone Encounter (Signed)
Pt called to report booming sounds in head since last night.  Pt seen in clinic yesterday for HTN.  BP - 224/84 Pt left clinic and went to pharmacy and got her Clonidine patch. The pounding started after putting it  On.   She reports a headache, nausea and pounding sounds in head.  She checked BP at home and reports BP - 246/133    Pt # - [phone]  Do you want pt seen in clinic or ED?

## 2014-03-08 NOTE — Progress Notes (Signed)
Report was giveb to Nurse on [facility].  Pat transported to [place] [facility] via wheelchair.  Saline lock intact in right hand.  Pt alert and oriented.  Sander Nephew, RN 03/08/2014 6:03 PM

## 2014-03-09 ENCOUNTER — Ambulatory Visit: Payer: PRIVATE HEALTH INSURANCE | Admitting: Internal Medicine

## 2014-03-09 ENCOUNTER — Encounter (HOSPITAL_COMMUNITY): Payer: Self-pay | Admitting: *Deleted

## 2014-03-09 DIAGNOSIS — R279 Unspecified lack of coordination: Secondary | ICD-10-CM

## 2014-03-09 DIAGNOSIS — R011 Cardiac murmur, unspecified: Secondary | ICD-10-CM

## 2014-03-09 DIAGNOSIS — I059 Rheumatic mitral valve disease, unspecified: Secondary | ICD-10-CM

## 2014-03-09 DIAGNOSIS — I10 Essential (primary) hypertension: Secondary | ICD-10-CM

## 2014-03-09 DIAGNOSIS — E876 Hypokalemia: Secondary | ICD-10-CM

## 2014-03-09 DIAGNOSIS — B20 Human immunodeficiency virus [HIV] disease: Secondary | ICD-10-CM

## 2014-03-09 DIAGNOSIS — B1920 Unspecified viral hepatitis C without hepatic coma: Secondary | ICD-10-CM

## 2014-03-09 LAB — BASIC METABOLIC PANEL
BUN: 17 mg/dL (ref 6–23)
CO2: 27 mEq/L (ref 19–32)
Calcium: 8.9 mg/dL (ref 8.4–10.5)
Chloride: 103 mEq/L (ref 96–112)
Creatinine, Ser: 0.58 mg/dL (ref 0.50–1.10)
GFR calc Af Amer: >90 mL/min (ref >90)
GFR calc non Af Amer: >90 mL/min (ref >90)
Glucose, Bld: 98 mg/dL (ref 70–99)
Potassium: 3.4 mEq/L — ABNORMAL LOW (ref 3.7–5.3)
Sodium: 142 mEq/L (ref 137–147)

## 2014-03-09 LAB — URINALYSIS, ROUTINE W REFLEX MICROSCOPIC
Bilirubin Urine: NEGATIVE
Glucose, UA: NEGATIVE mg/dL
Hgb urine dipstick: NEGATIVE
Ketones, ur: NEGATIVE mg/dL
Nitrite: NEGATIVE
Protein, ur: NEGATIVE mg/dL
Specific Gravity, Urine: 1.023 (ref 1.005–1.030)
Urobilinogen, UA: 2 mg/dL — ABNORMAL HIGH (ref 0.0–1.0)
pH: 6 (ref 5.0–8.0)

## 2014-03-09 LAB — CBC
HCT: 38.9 % (ref 36.0–46.0)
Hemoglobin: 13.5 g/dL (ref 12.0–15.0)
MCH: 35.4 pg — ABNORMAL HIGH (ref 26.0–34.0)
MCHC: 34.7 g/dL (ref 30.0–36.0)
MCV: 102.1 fL — ABNORMAL HIGH (ref 78.0–100.0)
Platelets: 216 10*3/uL (ref 150–400)
RBC: 3.81 MIL/uL — ABNORMAL LOW (ref 3.87–5.11)
RDW: 12.6 % (ref 11.5–15.5)
WBC: 5.8 10*3/uL (ref 4.0–10.5)

## 2014-03-09 LAB — URINE MICROSCOPIC-ADD ON

## 2014-03-09 LAB — RAPID URINE DRUG SCREEN, HOSP PERFORMED
Amphetamines: NOT DETECTED
Barbiturates: NOT DETECTED
Benzodiazepines: NOT DETECTED
Cocaine: NOT DETECTED
Opiates: POSITIVE — AB
Tetrahydrocannabinol: NOT DETECTED

## 2014-03-09 LAB — TROPONIN I
Troponin I: <0.3 ng/mL (ref <0.30)
Troponin I: <0.3 ng/mL (ref <0.30)

## 2014-03-09 MED ORDER — LISINOPRIL 40 MG PO TABS
40.0000 mg | ORAL_TABLET | Freq: Every day | ORAL | Status: DC
  Administered 2014-03-09 – 2014-03-10 (×2): 40 mg via ORAL
  Filled 2014-03-09 (×2): qty 1

## 2014-03-09 MED ORDER — HYDRALAZINE HCL 20 MG/ML IJ SOLN: 5.0000 mg | Freq: Four times a day (QID) | INTRAMUSCULAR | Status: DC | PRN

## 2014-03-09 MED ORDER — POTASSIUM CHLORIDE 20 MEQ/15ML (10%) PO LIQD: 40.0000 meq | Freq: Once | ORAL | Status: DC

## 2014-03-09 MED ORDER — POTASSIUM CHLORIDE 20 MEQ/15ML (10%) PO LIQD
40.0000 meq | Freq: Once | ORAL | Status: AC
  Administered 2014-03-09: 40 meq via ORAL
  Filled 2014-03-09: qty 30

## 2014-03-09 MED ORDER — HYDROCHLOROTHIAZIDE 25 MG PO TABS
25.0000 mg | ORAL_TABLET | Freq: Every day | ORAL | Status: DC
  Administered 2014-03-09 – 2014-03-10 (×2): 25 mg via ORAL
  Filled 2014-03-09 (×2): qty 1

## 2014-03-09 MED ORDER — LISINOPRIL-HYDROCHLOROTHIAZIDE 20-12.5 MG PO TABS: 2.0000 | ORAL_TABLET | Freq: Every day | ORAL | Status: DC

## 2014-03-09 NOTE — Progress Notes (Signed)
Utilization review completed.  

## 2014-03-09 NOTE — Progress Notes (Signed)
03/09/14 0850  Vitals  BP ! 180/65 mmHg  ambulating in halls with PT, no c/o dizziness or lightheadness

## 2014-03-09 NOTE — Progress Notes (Signed)
OT Cancellation Note  Patient Details Name: Nicole Mccormick MRN: [id number] DOB: 1946-02-27   Cancelled Treatment:    Reason Eval/Treat Not Completed: OT screened, no needs identified, will sign off  [facility], OTR/L  [phone] 03/09/2014 03/09/2014, 1:39 PM

## 2014-03-09 NOTE — Progress Notes (Signed)
Subjective: Patient seen and examined at the bedside this morning. She feels in her usual state of health. Denies headache, blurry vision, chest pain, shortness of breath, new or worsening weakness or numbness. She has pain in her left shoulder that is being worked up by sports medicine. She notes her hands are a little clumsy this morning.  I called her pharmacy to do some med rec: she has not picked up her Amlodipine since 01/10/14 for a #30 day supply. She did pick up her Clonidine on 3/30 and her Lisinopril-HCTZ on 3/27.  Objective: Vital signs in last 24 hours: Filed Vitals:   03/09/14 0156 03/09/14 0338 03/09/14 0455 03/09/14 0737  BP: 162/70 153/55    Pulse: 46 50    Temp:  98.4 F (36.9 C)  98.5 F (36.9 C)  TempSrc:  Oral  Oral  Resp: 12 15    Height:      Weight:   132 lb 11.5 oz (60.2 kg)   SpO2: 96% 95%     Weight change:   Intake/Output Summary (Last 24 hours) at 03/09/14 0758 Last data filed at 03/08/14 2300  Gross per 24 hour  Intake    240 ml  Output    300 ml  Net    -60 ml   Physical Exam  Constitutional: She is oriented to person, place, and time and well-developed, well-nourished, and in no distress.  HENT:  Head: Normocephalic and atraumatic.  Eyes: Conjunctivae and EOM are normal. Pupils are equal, round, and reactive to light.  Neck: Normal range of motion. Neck supple. No JVD present.  Cardiovascular: Regular rhythm.  Bradycardia present.  Exam reveals no gallop and no friction rub.   Murmur heard.  Systolic murmur is present with a grade of 2/6  Soft systolic murmur heard best at LLSB, I did not hear it radiating to carotids.  Pulmonary/Chest: Effort normal and breath sounds normal. No respiratory distress. She has no wheezes. She has no rales. She exhibits no tenderness.  Abdominal: Soft. She exhibits no distension. There is no tenderness.  Musculoskeletal: Normal range of motion. She exhibits tenderness (Left glenohumeral joint, chronic since fall  last year). She exhibits no edema.  Neurological: She is alert and oriented to person, place, and time. She has normal strength. No cranial nerve deficit or sensory deficit. Gait (Ambulated in halls with PT, no c/o dizziness or lightheadness) normal. GCS score is 15.  Reflex Scores:      Brachioradialis reflexes are 2+ on the right side and 2+ on the left side. Symmetric, mild hand clumsiness bilaterally on fine motor testing  Skin: Skin is warm and dry. She is not diaphoretic.  Psychiatric: Affect normal.    Lab Results: Basic Metabolic Panel:  Recent Labs Lab 03/08/14 2215 03/09/14 0136  NA 142 142  K 3.3* 3.4*  CL 102 103  CO2 26 27  GLUCOSE 88 98  BUN 18 17  CREATININE 0.58 0.58  CALCIUM 9.0 8.9  MG 2.0  --    Liver Function Tests:  Recent Labs Lab 03/08/14 2215  AST 42*  ALT 44*  ALKPHOS 70  BILITOT 0.5  PROT 7.8  ALBUMIN 3.5   CBC:  Recent Labs Lab 03/08/14 2215 03/09/14 0136  WBC 4.8 5.8  NEUTROABS 2.0  --   HGB 13.7 13.5  HCT 40.1 38.9  MCV 102.0* 102.1*  PLT 229 216   Cardiac Enzymes:  Recent Labs Lab 03/08/14 2212 03/09/14 0136  TROPONINI <0.30 <0.30  Thyroid Function Tests:  Recent Labs Lab 03/08/14 2212  TSH 0.771    Micro Results: Recent Results (from the past 240 hour(s))  MRSA PCR SCREENING     Status: None   Collection Time    03/08/14  6:42 PM      Result Value Ref Range Status   MRSA by PCR NEGATIVE  NEGATIVE Final   Comment:            The GeneXpert MRSA Assay (FDA     approved for NASAL specimens     only), is one component of a     comprehensive MRSA colonization     surveillance program. It is not     intended to diagnose MRSA     infection nor to guide or     monitor treatment for     MRSA infections.   Studies/Results: X-ray Chest Pa And Lateral   03/09/2014   CLINICAL DATA:  Cough at admission  EXAM: CHEST  2 VIEW  COMPARISON:  01/01/2013  FINDINGS: Borderline cardiomegaly, stable from prior. No edema or  consolidation. No effusion or pneumothorax. Mild pulmonary hyperinflation. Atherosclerotic aortic calcification, likely affecting the coronary arteries as well. Chronic, mild upper thoracic vertebral body wedging.  IMPRESSION: No active cardiopulmonary disease.   Electronically Signed   By: [name] M.D.   On: 03/09/2014 00:07   Ct Head Wo Contrast  03/08/2014   CLINICAL DATA:  Elevated blood pressure.  EXAM: CT HEAD WITHOUT CONTRAST  TECHNIQUE: Contiguous axial images were obtained from the base of the skull through the vertex without intravenous contrast.  COMPARISON:  None.  FINDINGS: Skull and Sinuses:No significant abnormality.  Orbits: Right cataract resection.  Brain: No evidence of acute abnormality, such as acute infarction, hemorrhage, hydrocephalus, or mass lesion/mass effect. There is chronic small vessel disease with extensive bilateral cerebral white matter low density, confluent around the frontal horns of the lateral ventricles. Low attenuating structures in the bilateral putamina are thus likely lacunes (rather than dilated perivascular spaces).  IMPRESSION: 1. No evidence of acute intracranial disease. 2. Chronic small vessel disease.   Electronically Signed   By: [name] M.D.   On: 03/08/2014 22:48   Medications: I have reviewed the patient's current medications. Scheduled Meds: . amLODipine  10 mg Oral Daily  . atazanavir  400 mg Oral Q breakfast  . [START ON 03/14/2014] cloNIDine  0.3 mg Transdermal Q Mon  . emtricitabine-tenofovir  1 tablet Oral Daily  . gabapentin  600 mg Oral TID  . heparin subcutaneous  5,000 Units Subcutaneous 3 times per day  . sodium chloride  3 mL Intravenous Q12H  . sodium chloride  3 mL Intravenous Q12H   Continuous Infusions:  PRN Meds:.sodium chloride, acetaminophen-codeine, sodium chloride  Assessment/Plan: Nicole Mccormick is a 68 y o F with PMH of CAD, HIV and HTN, who presented today with c/o of Eeevated blood pressure measured at home-  272/112  #Accelerted hypertension - Likely 2/2 medication noncompliance. She has not picked up her Amlodipine since 01/10/14. BP responded dramatically to hydralazine 5mg  IV x2 (212/76 > 139/63), so Clonidine patch was removed and home medications were held overnight. BP currently 180s/60s. Head CT and CXR were unremarkable. TSH wnl.  - Restart home amlodipine 10mg  daily - Continue to hold home Clonidine patch 0.3mg , lisinopril-HCTZ 20-12.5mg  daily, will monitor BP and add back as indicated (goal SBP 170s-190s) - Continue telemetry - Aldosterone/renin ratio pending - Checking UDS - Checking UA - Heart healthy  diet - PT/OT eval and treat (for history of falls)  #Heart murmur - Isolated systolic hypertension (BP 200s/70s) on admission concerning for widened pulse pressure. This doesn't sound like aortic regurgitation to me, but we will obtain a 2D echo to evaluate for valvular abnormalities. 2D echo 06/2011 showed no aortic valve abnormalities, mild mitral regurgitation, EF 75-80% with hyperdynamic LV systolic function and a dynamic gradient through the LVOT. Troponins have been negative x3. Repeat EKG this am is unchanged. - Obtain 2D echo with contrast  #Hand clumsiness - Some fine motor movement clumsiness in hands, but full neurologic assessment was otherwise wnl. This is a chronic issue. Do not suspect stroke. Head CT wnl. Supplementing K as below. - Continue to monitor closely - Neuro checks every 2 hours  #Hypokalemia - Trend below. - Gave 27meq of KCl liquid - Continue to monitor  Potassium  Date Value Ref Range Status  03/09/2014 3.4* 3.7 - 5.3 mEq/L Final  03/08/2014 3.3* 3.7 - 5.3 mEq/L Final  03/08/2014 4.2  3.5 - 5.3 mEq/L Final  03/01/2014 3.4* 3.5 - 5.3 mEq/L Final  12/29/2013 3.6  3.5 - 5.3 mEq/L Final    #HIV, well controlled - Follows with Dr Johnnye Sima, her last CD4 count on 03/01/2014 was 570, viral load <20. Compliant with meds- Truvada 200-300mg  po daily, and atazanavir 200mg   po BID.  - Continue home meds  #Hepatitis C - Stable.  - Continue to monitor  Hepatic Function Panel     Component Value Date/Time   PROT 7.8 03/08/2014 2215   ALBUMIN 3.5 03/08/2014 2215   AST 42* 03/08/2014 2215   ALT 44* 03/08/2014 2215   ALKPHOS 70 03/08/2014 2215   BILITOT 0.5 03/08/2014 2215    #DVT PPX - Heparin subq   Dispo: Disposition is deferred at this time, awaiting improvement of current medical problems.  Anticipated discharge in approximately 1-2 day(s).   The patient does have a current PCP [name], MD) and does need an [facility] hospital follow-up appointment after discharge.  The patient does not have transportation limitations that hinder transportation to clinic appointments.  .Services Needed at time of discharge: Y = Yes, Blank = No PT: No PT follow up;Supervision for mobility/OOB  OT:   RN:   Equipment: None recommended by PT   Other:     LOS: 1 day   Lesly Dukes, MD 03/09/2014, 7:58 AM  Lesly Dukes, MD  [email or web address] Pager # [phone] Office # [phone]

## 2014-03-09 NOTE — Progress Notes (Signed)
  Echocardiogram 2D Echocardiogram has been performed.  Nicole Mccormick 03/09/2014, 4:56 PM

## 2014-03-09 NOTE — H&P (Signed)
Internal Medicine Attending Admission Note Date: 03/09/2014  Patient name: Nicole Mccormick Medical record number: [id number] Date of birth: 05/03/1946 Age: 68 y.o. Gender: female  I saw and evaluated the patient. I reviewed the resident's note and I agree with the resident's findings and plan as documented in the resident's note, with the following additional comments.  Chief Complaint(s): Elevated blood pressure by home measurement; headache  History - key components related to admission: Patient is a 68 year old woman with history of coronary artery disease, hypertension, HIV, and other problems as outlined in the medical history admitted with severe blood pressure elevations by her home monitor and recent headache.  Physical Exam - key components related to admission:  Filed Vitals:   03/09/14 0455 03/09/14 0737 03/09/14 0850 03/09/14 1100  BP:  148/85 180/65   Pulse:  50 44   Temp:  98.5 F (36.9 C)  97.4 F (36.3 C)  TempSrc:  Oral  Oral  Resp:  16 25   Height:      Weight: 132 lb 11.5 oz (60.2 kg)     SpO2:  100% 98%    General: Alert, and no distress Lungs: Clear Heart: Regular; no extra sounds; 2/6 systolic murmur Abdomen: Bowel sounds present, soft, nontender Extremities: No edema   Lab results:   Basic Metabolic Panel:  Recent Labs  03/08/14 2215 03/09/14 0136  NA 142 142  K 3.3* 3.4*  CL 102 103  CO2 26 27  GLUCOSE 88 98  BUN 18 17  CREATININE 0.58 0.58  CALCIUM 9.0 8.9  MG 2.0  --     Liver Function Tests:  Recent Labs  03/08/14 2215  AST 42*  ALT 44*  ALKPHOS 70  BILITOT 0.5  PROT 7.8  ALBUMIN 3.5    CBC:  Recent Labs  03/08/14 2215 03/09/14 0136  WBC 4.8 5.8  HGB 13.7 13.5  HCT 40.1 38.9  MCV 102.0* 102.1*  PLT 229 216    Recent Labs  03/08/14 2215  NEUTROABS 2.0  LYMPHSABS 2.1  MONOABS 0.6  EOSABS 0.1  BASOSABS 0.0    Cardiac Enzymes:  Recent Labs  03/08/14 2212 03/09/14 0136 03/09/14 0824  TROPONINI <0.30 <0.30  <0.30    Thyroid Function Tests:  Recent Labs  03/08/14 2212  TSH 0.771     Urine Drug Screen: Drugs of Abuse     Component Value Date/Time   LABOPIA NEGATIVE 12/26/2010 0137   COCAINSCRNUR NEGATIVE 12/26/2010 0137   LABBENZ NEGATIVE 12/26/2010 0137   AMPHETMU NEGATIVE 12/26/2010 0137     Urinalysis    Component Value Date/Time   COLORURINE YELLOW 10/18/2012 1039   APPEARANCEUR CLEAR 10/18/2012 1039   LABSPEC 1.015 10/18/2012 1039   PHURINE 7.0 10/18/2012 1039   GLUCOSEU NEGATIVE 10/18/2012 1039   HGBUR NEGATIVE 10/18/2012 1039   BILIRUBINUR NEGATIVE 10/18/2012 1039   KETONESUR NEGATIVE 10/18/2012 1039   PROTEINUR NEGATIVE 10/18/2012 1039   UROBILINOGEN 1.0 10/18/2012 1039   NITRITE NEGATIVE 10/18/2012 1039   LEUKOCYTESUR SMALL* 10/18/2012 1039    Imaging results:  X-ray Chest Pa And Lateral   03/09/2014   CLINICAL DATA:  Cough at admission  EXAM: CHEST  2 VIEW  COMPARISON:  01/01/2013  FINDINGS: Borderline cardiomegaly, stable from prior. No edema or consolidation. No effusion or pneumothorax. Mild pulmonary hyperinflation. Atherosclerotic aortic calcification, likely affecting the coronary arteries as well. Chronic, mild upper thoracic vertebral body wedging.  IMPRESSION: No active cardiopulmonary disease.   Electronically Signed   By: [name]  M.D.   On: 03/09/2014 00:07   Ct Head Wo Contrast  03/08/2014   CLINICAL DATA:  Elevated blood pressure.  EXAM: CT HEAD WITHOUT CONTRAST  TECHNIQUE: Contiguous axial images were obtained from the base of the skull through the vertex without intravenous contrast.  COMPARISON:  None.  FINDINGS: Skull and Sinuses:No significant abnormality.  Orbits: Right cataract resection.  Brain: No evidence of acute abnormality, such as acute infarction, hemorrhage, hydrocephalus, or mass lesion/mass effect. There is chronic small vessel disease with extensive bilateral cerebral white matter low density, confluent around the frontal horns of  the lateral ventricles. Low attenuating structures in the bilateral putamina are thus likely lacunes (rather than dilated perivascular spaces).  IMPRESSION: 1. No evidence of acute intracranial disease. 2. Chronic small vessel disease.   Electronically Signed   By: [name] M.D.   On: 03/08/2014 22:48    Other results: EKG: Sinus bradycardia; left axis deviation; left ventricular hypertrophy  Assessment & Plan by Problem:  1.  Hypertensive urgency, possibly due to nonadherence to medications.  Blood pressure is improved today following initial treatment with parenteral hydralazine.  Plan is to resume oral antihypertensive medication regimen and adjust as indicated; monitor blood pressure closely.  2.  Systolic heart murmur with elevated pulse pressure.  Plan is 2-D echocardiogram.  3.  Other problems and plans as per the resident physician's note.

## 2014-03-09 NOTE — Evaluation (Signed)
Physical Therapy Evaluation Patient Details Name: Aloise Copus MRN: [id number] DOB: [birth year] Today's Date: 03/09/2014   History of Present Illness  68 y o F with PMH of CAD, HIV and HTN. Presented today with c/o of Elevated blood pressure measured at home- 272/112. Pt reports that her blood pressure is usually elevated at home, systolic in the 993Z-169C range, despite compliance with her medication- Amlodipine, Lisinopril-HCTZ, and clonidnine started yesterday when she came to clinic.    Clinical Impression  Pt adm due to the above. Pt ambulated unit at supervision to mod I level for safety only. Was able to ambulate without AD at this time. Scored 20 on DGI, demonstrating low risk of falls at this time. Pt has 24/7 (A) at home. No acute PT needs warranted at this time. Encouraged pt to ambulate with nursing and/or family as tolerated.     Follow Up Recommendations No PT follow up;Supervision for mobility/OOB    Equipment Recommendations  None recommended by PT    Recommendations for Other Services       Precautions / Restrictions Precautions Precautions: Fall Precaution Comments: pt has fallen at home; last fall ~2 months ago  Restrictions Weight Bearing Restrictions: No      Mobility  Bed Mobility               General bed mobility comments: not assessed; pt denies any difficulties   Transfers Overall transfer level: Modified independent Equipment used: None             General transfer comment: relied on armrests; no sway or LOB noted  Ambulation/Gait Ambulation/Gait assistance: Supervision;Modified independent (Device/Increase time) Ambulation Distance (Feet): 300 Feet Assistive device: None Gait Pattern/deviations: Step-through pattern;Drifts right/left Gait velocity: WFL Gait velocity interpretation: at or above normal speed for age/gender General Gait Details: pt drifts at times when challenged with high level balance activities; see DGI; pt ambulated  without AD; reports she feels "off" no LOB noted; min cues for safety; pt progressing from S to mod I   Stairs Stairs: Yes Stairs assistance: Min guard Stair Management: No rails;Step to pattern;Forwards Number of Stairs: 3 General stair comments: cues for safety; min guard for HH (A) to simulate home enviroment   Wheelchair Mobility    Modified Rankin (Stroke Patients Only)       Balance Overall balance assessment: Modified Independent;History of Falls                                   Pertinent Vitals/Pain BP 180/65 at end of session; RN notified.     Home Living Family/patient expects to be discharged to:: Private residence Living Arrangements: Spouse/significant other;Other (Comment);Children ([place] aide 3x week ) Available Help at Discharge: Family;Personal care attendant;Available 24 hours/day Type of Home: House Home Access: Level entry     Home Layout: One level Home Equipment: Walker - 4 wheels;Bedside commode Additional Comments: pt reports she has 3 steps to get to street; no handrails; tub shower     Prior Function Level of Independence: Needs assistance   Gait / Transfers Assistance Needed: pt independent with gt; ambulates with rollator as needed and uses motorized scooter in stores   ADL's / Homemaking Assistance Needed: independent with ADLs; has [place] aide 3x week for ~2 hours for cooking/cleaning         Hand Dominance   Dominant Hand: Right    Extremity/Trunk Assessment   Upper Extremity Assessment:  Overall WFL for tasks assessed           Lower Extremity Assessment: Overall WFL for tasks assessed      Cervical / Trunk Assessment: Normal  Communication   Communication: No difficulties  Cognition Arousal/Alertness: Awake/alert Behavior During Therapy: WFL for tasks assessed/performed Overall Cognitive Status: Within Functional Limits for tasks assessed                      General Comments      Exercises         Assessment/Plan    PT Assessment Patent does not need any further PT services  PT Diagnosis     PT Problem List    PT Treatment Interventions     PT Goals (Current goals can be found in the Care Plan section) Acute Rehab PT Goals Patient Stated Goal: to get out of here PT Goal Formulation: No goals set, d/c therapy    Frequency     Barriers to discharge        End of Session Equipment Utilized During Treatment: Gait belt Activity Tolerance: Patient tolerated treatment well Patient left: in chair;with call bell/phone within reach         Time: 6812-7517 PT Time Calculation (min): 16 min   Charges:   PT Evaluation $Initial PT Evaluation Tier I: 1 Procedure PT Treatments $Gait Training: 8-22 mins   PT G CodesGustavus Bryant, [place] 03/09/2014, 9:01 AM

## 2014-03-09 NOTE — Progress Notes (Signed)
Report called to Nicole Mccormick , pt transferring to [phone] via w/c with belongings.

## 2014-03-09 NOTE — Progress Notes (Signed)
NURSING PROGRESS NOTE  Nicole Mccormick [id number] Transfer Data: 03/09/2014 6:48 PM Attending Provider: [name], MD [id number], ELIZABETH, MD Code Status: FUll  Nicole Mccormick is a 68 y.o. female patient transferred from [place] -No acute distress noted.  -No complaints of shortness of breath.  -No complaints of chest pain.   Cardiac Monitoring: Box # 19 in place. Cardiac monitor yields: SB  Blood pressure 147/58, pulse 44, temperature 98.1 F (36.7 C), temperature source Oral, resp. rate 18, height 5\' 2"  (1.575 m), weight 60.2 kg (132 lb 11.5 oz), SpO2 96.00%.   IV Fluids:  IV in place, occlusive dsg intact without redness, IV cath hand left, condition patent and SL    Allergies:  Penicillins and Avelox  Past Medical History:   has a past medical history of Hypertension; Abnormal Pap smear; Asthma; Coronary artery disease; HIV infection; Varicosities; Seizures; GERD (gastroesophageal reflux disease); Iron deficiency anemia; Arthritis; CHF (congestive heart failure); and Heart murmur.  Past Surgical History:   has past surgical history that includes Abdominal hysterectomy; Esophagogastroduodenoscopy (10/13/11); Colonoscopy (10/13/11); Colonoscopy (10/13/2011); and Esophagogastroduodenoscopy (10/13/2011).  Social History:   reports that she has been smoking Cigarettes.  She has a 50 pack-year smoking history. She has never used smokeless tobacco. She reports that she drinks about 1.2 ounces of alcohol per week. She reports that she does not use illicit drugs.  Skin: Intact  Patient/Family orientated to room. Information packet given to patient/family. Admission inpatient armband information verified with patient/family to include name and date of birth and placed on patient arm. Side rails up x 2, fall assessment and education completed with patient/family. Patient/family able to verbalize understanding of risk associated with falls and verbalized understanding to call for assistance  before getting out of bed. Call light within reach. Patient/family able to voice and demonstrate understanding of unit orientation instructions.    Will continue to evaluate and treat per MD orders.

## 2014-03-10 LAB — BASIC METABOLIC PANEL
BUN: 18 mg/dL (ref 6–23)
CO2: 28 mEq/L (ref 19–32)
Calcium: 9.2 mg/dL (ref 8.4–10.5)
Chloride: 100 mEq/L (ref 96–112)
Creatinine, Ser: 0.66 mg/dL (ref 0.50–1.10)
GFR calc Af Amer: >90 mL/min (ref >90)
GFR calc non Af Amer: 88 mL/min — ABNORMAL LOW (ref >90)
Glucose, Bld: 97 mg/dL (ref 70–99)
Potassium: 4.3 mEq/L (ref 3.7–5.3)
Sodium: 138 mEq/L (ref 137–147)

## 2014-03-10 MED ORDER — AMLODIPINE BESYLATE 10 MG PO TABS: 10.0000 mg | ORAL_TABLET | Freq: Every day | ORAL | Status: DC

## 2014-03-10 NOTE — Progress Notes (Signed)
Case discussed with Dr. Doug Sou at the time of the visit.  We reviewed the resident's history and exam and pertinent patient test results.  I agree with the assessment, diagnosis and plan of care documented in the resident's note.

## 2014-03-10 NOTE — Progress Notes (Signed)
Pt with discharge orders to home, discharge teaching completed with patient. Ask me 3 and teach back method used. PIV removed and intact. Pt verbalized understanding of all discharge teaching. Pt with no further questions and stable upon discharge.

## 2014-03-10 NOTE — Discharge Instructions (Signed)
It was a pleasure taking care of you. - Please be sure to take your Amlodipine 10mg  every morning. This is an important blood pressure medicine, and without it your BP is out of control and too high. This has been prescribed for you before, but I went ahead and refilled it. It will be at your pharmacy when you leave the hospital. - Please also continue to take your Lisinopril-HCTZ 20-12.5 mg combination pill, 2 pills once a day every morning. - We would like you to stop using the Clonidine patch. You blood pressure is well controlled without it on the other two medicines. - Please call the clinic or go back to the ED if you develop worsening chest pain, shortness of breath, new or worsening weakness.

## 2014-03-10 NOTE — Discharge Summary (Signed)
Name: Nicole Mccormick MRN: [id number] DOB: [birth year] 68 y.o. PCP: [name], MD  Date of Admission: 03/08/2014  6:19 PM Date of Discharge: 03/10/2014 Attending Physician: [name], MD  Discharge Diagnosis: Principal Problem:   Hypertensive urgency Active Problems:   HIV DISEASE   TOBACCO ABUSE   CARRIER, VIRAL HEPATITIS C   CAD (coronary artery disease)   Left shoulder pain  Discharge Medications:   Medication List    STOP taking these medications       cloNIDine 0.3 mg/24hr patch  Commonly known as:  CATAPRES - Dosed in mg/24 hr      TAKE these medications       acetaminophen-codeine 300-30 MG per tablet  Commonly known as:  TYLENOL #3  Take 1 tablet by mouth every 8 (eight) hours as needed for moderate pain or severe pain.     acyclovir 400 MG tablet  Commonly known as:  ZOVIRAX  Take 400 mg by mouth as needed (for flare ups).     amLODipine 10 MG tablet  Commonly known as:  NORVASC  Take 1 tablet (10 mg total) by mouth daily.     aspirin EC 81 MG tablet  Take 1 tablet (81 mg total) by mouth daily.     atazanavir 200 MG capsule  Commonly known as:  REYATAZ  Take 400 mg by mouth daily with breakfast. **SPACED 12 HOURS FROM ANTACIDS**     baclofen 20 MG tablet  Commonly known as:  LIORESAL  Take 20 mg by mouth 3 (three) times daily.     emtricitabine-tenofovir 200-300 MG per tablet  Commonly known as:  TRUVADA  Take 1 tablet by mouth daily.     ENSURE NUTRA SHAKE HI-CAL Liqd  Take 1 Can by mouth 2 (two) times daily.     gabapentin 600 MG tablet  Commonly known as:  NEURONTIN  Take 1 tablet (600 mg total) by mouth 3 (three) times daily.     lisinopril-hydrochlorothiazide 20-12.5 MG per tablet  Commonly known as:  PRINZIDE,ZESTORETIC  Take 2 tablets by mouth daily.     sucralfate 1 G tablet  Commonly known as:  CARAFATE  Take 1 tablet (1 g total) by mouth 4 (four) times daily.        Disposition and follow-up:   Ms.Tinley Route was  discharged from [facility] in Stable condition.  At the hospital follow up visit please address:  1.  BP control, medication compliance, weakness/clumsiness.  2.  Labs / imaging needed at time of follow-up: BMP (for K)  3.  Pending labs/ test needing follow-up: Aldosterone/renin ratio  Follow-up Appointments: Follow-up Information   Follow up with [name], MD On 03/17/2014. (@10 :30am. This is your primary care doctor.)    Specialty:  Internal Medicine   Contact information:   [address] [place] [address] [phone]       Follow up with [name], MD On 03/21/2014. (@11 :00 am. This is your infectious disease doctor.)    Specialty:  Infectious Diseases   Contact information:   [address]  [place] [address] [phone]       Discharge Instructions: Discharge Orders   Future Appointments Provider Department Dept Phone   03/17/2014 10:30 AM [name], MD [place] [phone]   03/21/2014 11:00 AM Campbell Riches, MD [facility] [phone]   04/14/2014 8:45 AM  [name], MD [name] [phone]   Future Orders Complete By Expires   Call MD for:  difficulty breathing, headache or visual disturbances  As directed    Call MD for:  persistant dizziness or light-headedness  As directed    Call MD for:  temperature >100.4  As directed    Diet - low sodium heart healthy  As directed    Increase activity slowly  As directed       Consultations:  None  Procedures Performed:  X-ray Chest Pa And Lateral   03/09/2014   CLINICAL DATA:  Cough at admission  EXAM: CHEST  2 VIEW  COMPARISON:  01/01/2013  FINDINGS: Borderline cardiomegaly, stable from prior. No edema or consolidation. No effusion or pneumothorax. Mild pulmonary hyperinflation. Atherosclerotic aortic calcification, likely  affecting the coronary arteries as well. Chronic, mild upper thoracic vertebral body wedging.  IMPRESSION: No active cardiopulmonary disease.   Electronically Signed   By: [name] M.D.   On: 03/09/2014 00:07   Ct Head Wo Contrast  03/08/2014   CLINICAL DATA:  Elevated blood pressure.  EXAM: CT HEAD WITHOUT CONTRAST  TECHNIQUE: Contiguous axial images were obtained from the base of the skull through the vertex without intravenous contrast.  COMPARISON:  None.  FINDINGS: Skull and Sinuses:No significant abnormality.  Orbits: Right cataract resection.  Brain: No evidence of acute abnormality, such as acute infarction, hemorrhage, hydrocephalus, or mass lesion/mass effect. There is chronic small vessel disease with extensive bilateral cerebral white matter low density, confluent around the frontal horns of the lateral ventricles. Low attenuating structures in the bilateral putamina are thus likely lacunes (rather than dilated perivascular spaces).  IMPRESSION: 1. No evidence of acute intracranial disease. 2. Chronic small vessel disease.   Electronically Signed   By: [name] M.D.   On: 03/08/2014 22:48   Mr Shoulder Left Wo Contrast  03/02/2014   CLINICAL DATA:  Left shoulder pain.  Injured in November 2014.  EXAM: MRI OF THE LEFT SHOULDER WITHOUT CONTRAST  TECHNIQUE: Multiplanar, multisequence MR imaging of the shoulder was performed. No intravenous contrast was administered.  COMPARISON:  Radiographs 12/13/2013  FINDINGS: Rotator cuff: Moderate rotator cuff tendinopathy/tendinosis with Mild to moderate thinning of the supraspinatus tendon and areas of bursal and articular surface fraying and fibrillation. No discrete partial or full-thickness rotator cuff tear. There are interstitial tears involving the attachment region of the subscapularis tendon which is somewhat thickened and edematous in appearance.  Muscles:  Normal.  Biceps long head: Intact. There is moderate tendinopathy and probable  longitudinal split type tear.  Acromioclavicular Joint: Mild degenerative changes. The acromion is type 2 and shape. No significant lateral downsloping or undersurface spurring change.  Glenohumeral Joint: Mild degenerative changes. No joint effusion. Mild to moderate synovitis.  Labrum:  Labral degenerative changes without obvious tear.  Bones:  No acute bony findings.  Minimal subacromial/subdeltoid bursitis.  IMPRESSION: 1. Moderate rotator cuff tendinopathy/tendinosis. There is mild to moderate thinning of the supraspinatus tendon with areas of bursal and articular surface fraying and fibrillation. There are interstitial tears, swelling and edema involving the subscapularis tendon. 2. Moderate long head biceps tendinopathy with a longitudinal split type tear. 3. Labral degenerative changes without obvious tear. 4. Moderate synovitis or adhesive capsulitis.   Electronically Signed   By: Kalman Jewels M.D.   On: 03/02/2014 10:10    2D Echo:   ------------------------------------------------------------ Transthoracic Echocardiography  (Report amended )  Patient: [place], [place] MR #:  [id number] Study Date: 03/09/2014 Gender: F Age: 25 Height: 157.5cm Weight: 59.9kg BSA: 1.56m^2 Pt. Status: Room: 3S05C  ADMITTING Jay Schlichter ATTENDING Coralyn Helling REFERRING Vertell Novak SONOGRAPHER Mauricio Po, RDCS, CCT PERFORMING [facility], Inpatient cc:  ------------------------------------------------------------ LV EF: 65% - 70%  ------------------------------------------------------------ Indications: Murmur 785.2.  ------------------------------------------------------------ History: Risk factors: Hypertension.  ------------------------------------------------------------ Study Conclusions  - Left ventricle: The cavity size was normal. Wall thickness was increased in a pattern of mild LVH. Systolic function was vigorous. The estimated ejection  fraction was in the range of 65% to 70%. Wall motion was normal; there were no regional wall motion abnormalities. Doppler parameters are consistent with abnormal left ventricular relaxation (grade 1 diastolic dysfunction). Doppler parameters are consistent with high ventricular filling pressure. - Mitral valve: Calcified annulus. Mild regurgitation. - Left atrium: The atrium was mildly dilated. - Pulmonary arteries: Systolic pressure was mildly increased. PA peak pressure: 65mm Hg (S). Impressions:  - Mildly elevated LVOT gradient of 2.1 m/s; midcavitary gradient of approximately 2.5 m/s most likely related to vigorous LV function. Transthoracic echocardiography. M-mode, complete 2D, spectral Doppler, and color Doppler. Height: Height: 157.5cm. Height: 62in. Weight: Weight: 59.9kg. Weight: 131.7lb. Body mass index: BMI: 24.1kg/m^2. Body surface area: BSA: 1.27m^2. Patient status: Inpatient. Location: ICU/CCU  Aortic valve: Trileaflet; mildly calcified leaflets. Mobility was not restricted. Doppler: There was no stenosis. No regurgitation. Mean gradient: 43mm Hg (S). Peak gradient: 24mm Hg (S).   ------------------------------------------------------------   Admission HPI:  38 y o F with PMH of CAD, HIV and HTN. Presented today with c/o of Elevated blood pressure measured at home- 272/112. Pt reports that her blood pressure is usually elevated at home, systolic in the 101B-510C range, despite compliance with her medication- Amlodipine, Lisinopril-HCTZ, and clonidnine started yesterday when she came to clinic.  Headaches started last night at about 1am, said it started like something soft hitting her head, intermittent like every minute, initially very mild, and gradually increased in severity. Patient also reported being nauseous, started yesterday and had an episode of vomiting- clear liqud,non bilous, non bloody, and only one occurrence yesterday. Pt vaguely reports that she started  seeing something silvery flash across her eyes today while on admission, one occurrence, no prior episodes, but vision presently clear, No falls, LOC or confusion, no fever or neck pain, no back pain, no change in urine, no difficulty breathing, or no leg swelling.  I called her pharmacy to do some med rec: she has not picked up her Amlodipine since 01/10/14 for a #30 day supply. She did pick up her Clonidine on 3/30 and her Lisinopril-HCTZ on 3/27.  Physical Exam  Constitutional: She is oriented to person, place, and time and well-developed, well-nourished, and in no distress.  HENT:  Head: Normocephalic and atraumatic.  Eyes: Conjunctivae and EOM are normal. Pupils are equal, round, and reactive to light.  Neck: Normal range of motion. Neck supple. No JVD present.  Cardiovascular: Regular rhythm. Bradycardia present. Exam reveals no gallop and no friction rub.  Murmur heard.  Systolic murmur is present with a grade of 2/6  Soft systolic murmur heard best at LLSB, I did not hear it radiating to carotids.  Pulmonary/Chest: Effort normal and breath sounds normal. No respiratory distress. She has no wheezes. She has no rales. She exhibits no tenderness.  Abdominal: Soft. She exhibits no distension. There is no tenderness.  Musculoskeletal: Normal range of motion. She exhibits tenderness (Left glenohumeral joint, chronic since fall last year). She  exhibits no edema.  Neurological: She is alert and oriented to person, place, and time. She has normal strength. No cranial nerve deficit or sensory deficit. Gait (Ambulated in halls with PT, no c/o dizziness or lightheadness) normal. GCS score is 15.  Reflex Scores:  Brachioradialis reflexes are 2+ on the right side and 2+ on the left side. Symmetric, mild hand clumsiness bilaterally on fine motor testing  Skin: Skin is warm and dry. She is not diaphoretic.  Psychiatric: Affect normal.    Hospital Course by problem list: Stephane Cristea is a 70 y o F with  PMH of CAD, HIV and HTN, who presented today with c/o of Eeevated blood pressure measured at home- 272/112   1. Accelerted hypertension - Likely 2/2 amlodipine noncompliance, we called her pharmacy and confirmed she had not refilled her amlodipine since 01/10/14 for a #30 day supply. BP responded dramatically to hydralazine 5mg  IV x2 (212/76 > 139/63). Thus, her Clonidine patch was removed and home BP medications were held the night of admission. Head CT and CXR were unremarkable. TSH wnl. UA unremarkable. UDS appropriate. BP was 140s/50-70s after resuming home amlodipine 10mg  daily and lisinopril-HCTZ 40-25mg  daily. We continued to hold her home Clonidine patch 0.3mg . This was just started on 3/30 in the setting of amlodipine noncompliance, so she likely does not need this - and it could worsen her baseline bradycardia. Aldosterone/renin ratio (collected in clinic) is pending. She was counseled on medication compliance and discharged in stable condition to home. Her amlodipine was refilled. At discharge she felt "great" and denied headache, weakness, chest pain, shortness of breath.  2. Heart murmur 2/2 systolic flow - Isolated systolic hypertension (BP 200s/70s) on admission was concerning for widened pulse pressure. She did have a heart murmur on exam. Night team was concerned it radiated to the carotids and perhaps represented AS/AR, but I did not appreciate this. Rather, on my exam she had a soft 2/6 systolic murmur heard best at LLSB. We obtained a 2D echo to evaluate for valvular abnormalities. It showed mild LVH, vigorous systolic function with EF 65-70%, no wall motion abnormalities, grade 1 diastolic dysfunction, mildly elevated LVOT gradient, aortic valve was mildly calcified but mobility not restricted. Murmur is therefore most likely related to flow. Troponins have been negative x3. Serial EKGs were not concerning.    3. Hand clumsiness - Patient complained of hand clumsiness "as long as she could  remember." I noted some fine motor movement clumsiness in her hands, symmetric, but full neurologic assessment was otherwise wnl. This is a chronic issue for her. Concern in this hypertensive patient would be stroke, but we did not suspect this given history and exam. Head CT wnl. Neuro checks were performed every 2 hours and normal. We supplemented K as below. She worked with both PT and OT with no issues identified. Continue to monitor in the outpatient setting.  4. Hypokalemia - Trend below. We supplemented with oral KCl. Please follow up BMP as outpatient.  Potassium   Date  Value  Ref Range  Status   03/09/2014  3.4*  3.7 - 5.3 mEq/L  Final   03/08/2014  3.3*  3.7 - 5.3 mEq/L  Final   03/08/2014  4.2  3.5 - 5.3 mEq/L  Final   03/01/2014  3.4*  3.5 - 5.3 mEq/L  Final   12/29/2013  3.6  3.5 - 5.3 mEq/L  Final    5. HIV, well controlled - Follows with Dr Johnnye Sima, her last CD4 count  on 03/01/2014 was 570, viral load <20. Compliant with meds including Truvada 200-300mg  po daily, and atazanavir 200mg  po BID.  6. Hepatitis C - Stable.   Hepatic Function Panel    Component  Value  Date/Time    PROT  7.8  03/08/2014 2215    ALBUMIN  3.5  03/08/2014 2215    AST  42*  03/08/2014 2215    ALT  44*  03/08/2014 2215    ALKPHOS  70  03/08/2014 2215    BILITOT  0.5  03/08/2014 2215     Services Needed at time of discharge: Y = Yes, Blank = No  PT:  No PT follow up;Supervision for mobility/OOB   OT:  OT screened, no needs identified, will sign off   RN:    Equipment:  None recommended by PT   Other:       Discharge Vitals:   BP 145/54  Pulse 49  Temp(Src) 97.8 F (36.6 C) (Oral)  Resp 16  Ht 5\' 2"  (1.575 m)  Wt 139 lb 1.8 oz (63.1 kg)  BMI 25.44 kg/m2  SpO2 98%  Discharge Labs:  Results for orders placed during the hospital encounter of 03/08/14 (from the past 24 hour(s))  URINE RAPID DRUG SCREEN (HOSP PERFORMED)     Status: Abnormal   Collection Time    03/09/14  4:00 PM      Result Value  Ref Range   Opiates POSITIVE (*) NONE DETECTED   Cocaine NONE DETECTED  NONE DETECTED   Benzodiazepines NONE DETECTED  NONE DETECTED   Amphetamines NONE DETECTED  NONE DETECTED   Tetrahydrocannabinol NONE DETECTED  NONE DETECTED   Barbiturates NONE DETECTED  NONE DETECTED  URINALYSIS, ROUTINE W REFLEX MICROSCOPIC     Status: Abnormal   Collection Time    03/09/14  4:00 PM      Result Value Ref Range   Color, Urine AMBER (*) YELLOW   APPearance CLEAR  CLEAR   Specific Gravity, Urine 1.023  1.005 - 1.030   pH 6.0  5.0 - 8.0   Glucose, UA NEGATIVE  NEGATIVE mg/dL   Hgb urine dipstick NEGATIVE  NEGATIVE   Bilirubin Urine NEGATIVE  NEGATIVE   Ketones, ur NEGATIVE  NEGATIVE mg/dL   Protein, ur NEGATIVE  NEGATIVE mg/dL   Urobilinogen, UA 2.0 (*) 0.0 - 1.0 mg/dL   Nitrite NEGATIVE  NEGATIVE   Leukocytes, UA SMALL (*) NEGATIVE  URINE MICROSCOPIC-ADD ON     Status: None   Collection Time    03/09/14  4:00 PM      Result Value Ref Range   Squamous Epithelial / LPF RARE  RARE   WBC, UA 3-6  <3 WBC/hpf   RBC / HPF 0-2  <3 RBC/hpf   Bacteria, UA RARE  RARE    Signed: Lesly Dukes, MD 03/10/2014, 9:52 AM   Time Spent on Discharge: 35 minutes Services Ordered on Discharge: None Equipment Ordered on Discharge: None

## 2014-03-10 NOTE — Progress Notes (Signed)
Subjective: Patient seen and examined at the bedside this morning. She feels "great" and is ready to go home. She has been walking about the halls. Denies SOB, chest pain. We talked about her medication regimen and she admits she doesn't have the Amlodipine at home. We went over her regimen together and she expressed understanding.   Objective: Vital signs in last 24 hours: Filed Vitals:   03/09/14 1500 03/09/14 1846 03/09/14 2317 03/10/14 0500  BP: 147/58 145/74 145/54   Pulse:   49   Temp: 98.1 F (36.7 C) 97.9 F (36.6 C) 97.8 F (36.6 C)   TempSrc: Oral Oral Oral   Resp: 18 18 16    Height:  5\' 2"  (1.575 m)    Weight:  138 lb 9.6 oz (62.869 kg)  139 lb 1.8 oz (63.1 kg)  SpO2: 96% 97% 98%    Weight change: 6 lb 6.4 oz (2.903 kg)  Intake/Output Summary (Last 24 hours) at 03/10/14 0747 Last data filed at 03/09/14 1600  Gross per 24 hour  Intake    600 ml  Output    200 ml  Net    400 ml   Physical Exam  Constitutional: She is oriented to person, place, and time and well-developed, well-nourished, and in no distress.  HENT:  Head: Normocephalic and atraumatic.  Eyes: Conjunctivae and EOM are normal. Pupils are equal, round, and reactive to light.  Neck: Normal range of motion. Neck supple. No JVD present.  Cardiovascular: Regular rhythm.  Bradycardia present.  Exam reveals no gallop and no friction rub.   Murmur heard.  Systolic murmur is present with a grade of 2/6  Soft systolic murmur heard best at LLSB.  Pulmonary/Chest: Effort normal and breath sounds normal. No respiratory distress. She has no wheezes. She has no rales. She exhibits no tenderness.  Abdominal: Soft. She exhibits no distension. There is no tenderness.  Musculoskeletal: Normal range of motion. She exhibits tenderness (Left glenohumeral joint, chronic since fall last year). She exhibits no edema.  Neurological: She is alert and oriented to person, place, and time. She has normal strength. No cranial  nerve deficit or sensory deficit. Gait (Ambulated in halls with PT, no c/o dizziness or lightheadness) normal. GCS score is 15.  Reflex Scores:      Brachioradialis reflexes are 2+ on the right side and 2+ on the left side. Skin: Skin is warm and dry. She is not diaphoretic.  Psychiatric: Affect normal.    Lab Results: Basic Metabolic Panel:  Recent Labs Lab 03/08/14 2215 03/09/14 0136  NA 142 142  K 3.3* 3.4*  CL 102 103  CO2 26 27  GLUCOSE 88 98  BUN 18 17  CREATININE 0.58 0.58  CALCIUM 9.0 8.9  MG 2.0  --    Liver Function Tests:  Recent Labs Lab 03/08/14 2215  AST 42*  ALT 44*  ALKPHOS 70  BILITOT 0.5  PROT 7.8  ALBUMIN 3.5   CBC:  Recent Labs Lab 03/08/14 2215 03/09/14 0136  WBC 4.8 5.8  NEUTROABS 2.0  --   HGB 13.7 13.5  HCT 40.1 38.9  MCV 102.0* 102.1*  PLT 229 216   Cardiac Enzymes:  Recent Labs Lab 03/08/14 2212 03/09/14 0136 03/09/14 0824  TROPONINI <0.30 <0.30 <0.30   Thyroid Function Tests:  Recent Labs Lab 03/08/14 2212  TSH 0.771    Micro Results: Recent Results (from the past 240 hour(s))  MRSA PCR SCREENING     Status: None   Collection  Time    03/08/14  6:42 PM      Result Value Ref Range Status   MRSA by PCR NEGATIVE  NEGATIVE Final   Comment:            The GeneXpert MRSA Assay (FDA     approved for NASAL specimens     only), is one component of a     comprehensive MRSA colonization     surveillance program. It is not     intended to diagnose MRSA     infection nor to guide or     monitor treatment for     MRSA infections.   Studies/Results: X-ray Chest Pa And Lateral   03/09/2014   CLINICAL DATA:  Cough at admission  EXAM: CHEST  2 VIEW  COMPARISON:  01/01/2013  FINDINGS: Borderline cardiomegaly, stable from prior. No edema or consolidation. No effusion or pneumothorax. Mild pulmonary hyperinflation. Atherosclerotic aortic calcification, likely affecting the coronary arteries as well. Chronic, mild upper thoracic  vertebral body wedging.  IMPRESSION: No active cardiopulmonary disease.   Electronically Signed   By: [name] M.D.   On: 03/09/2014 00:07   Ct Head Wo Contrast  03/08/2014   CLINICAL DATA:  Elevated blood pressure.  EXAM: CT HEAD WITHOUT CONTRAST  TECHNIQUE: Contiguous axial images were obtained from the base of the skull through the vertex without intravenous contrast.  COMPARISON:  None.  FINDINGS: Skull and Sinuses:No significant abnormality.  Orbits: Right cataract resection.  Brain: No evidence of acute abnormality, such as acute infarction, hemorrhage, hydrocephalus, or mass lesion/mass effect. There is chronic small vessel disease with extensive bilateral cerebral white matter low density, confluent around the frontal horns of the lateral ventricles. Low attenuating structures in the bilateral putamina are thus likely lacunes (rather than dilated perivascular spaces).  IMPRESSION: 1. No evidence of acute intracranial disease. 2. Chronic small vessel disease.   Electronically Signed   By: [name] M.D.   On: 03/08/2014 22:48   Medications: I have reviewed the patient's current medications. Scheduled Meds: . amLODipine  10 mg Oral Daily  . atazanavir  400 mg Oral Q breakfast  . emtricitabine-tenofovir  1 tablet Oral Daily  . gabapentin  600 mg Oral TID  . heparin subcutaneous  5,000 Units Subcutaneous 3 times per day  . lisinopril  40 mg Oral Daily   And  . hydrochlorothiazide  25 mg Oral Daily  . sodium chloride  3 mL Intravenous Q12H  . sodium chloride  3 mL Intravenous Q12H   Continuous Infusions:  PRN Meds:.sodium chloride, acetaminophen-codeine, sodium chloride  Assessment/Plan: Kerrilynn Derenzo is a 68 y o F with PMH of CAD, HIV and HTN, who presented today with c/o of Eeevated blood pressure measured at home- 272/112  #Accelerted hypertension - Likely 2/2 Amlodipine noncompliance. BP 140s/50-70s overnight after resuming home meds. UA unremarkable. UDS appropriate. -  Continue home amlodipine 10mg  daily, lisinopril-HCTZ 40-25mg  daily - Hold home Clonidine patch 0.3mg  (just started on 3/30 in the setting of Amlodipine noncompliance, she likely does not need this - and it could worsen her baseline bradycardia) - Continue telemetry - Aldosterone/renin ratio pending - Heart healthy diet - Counseled on medication compliance - Medically stable for discharge home  #Heart murmur 2/2 systolic flow - Isolated systolic hypertension (BP 200s/70s) on admission concerning for widened pulse pressure. Murmur didn't sound like aortic regurgitation to me, but we obtained a 2D echo to evaluate for valvular abnormalities. It showed mild LVH, vigorous systolic function  with EF 65-70%, no wall motion abnormalities, grade 1 diastolic dysfunction, mildly elevated LVOT gradient, aortic valve was mildly calcified but mobility not restricted. Troponins have been negative x3. Serial EKGs not concerning. - Continue to monitor  #Hand clumsiness - Some fine motor movement clumsiness in hands, but full neurologic assessment was otherwise wnl. This is a chronic issue. Do not suspect stroke. Head CT wnl. Supplementing K as below. She worked with both PT and OT with no issues identified. - Continue to monitor closely - Neuro checks every 2 hours  #Hypokalemia - Trend below. - Gave 27meq of KCl liquid - Continue to monitor, BMP from this morning has not been collected, I will follow up  Potassium  Date Value Ref Range Status  03/09/2014 3.4* 3.7 - 5.3 mEq/L Final  03/08/2014 3.3* 3.7 - 5.3 mEq/L Final  03/08/2014 4.2  3.5 - 5.3 mEq/L Final  03/01/2014 3.4* 3.5 - 5.3 mEq/L Final  12/29/2013 3.6  3.5 - 5.3 mEq/L Final    #HIV, well controlled - Follows with Dr Johnnye Sima, her last CD4 count on 03/01/2014 was 570, viral load <20. Compliant with meds- Truvada 200-300mg  po daily, and atazanavir 200mg  po BID.  - Continue home meds  #Hepatitis C - Stable.  - Continue to monitor  Hepatic Function  Panel     Component Value Date/Time   PROT 7.8 03/08/2014 2215   ALBUMIN 3.5 03/08/2014 2215   AST 42* 03/08/2014 2215   ALT 44* 03/08/2014 2215   ALKPHOS 70 03/08/2014 2215   BILITOT 0.5 03/08/2014 2215    #DVT PPX - Heparin subq   Dispo: Anticipated discharge in approximately 0-1 day(s).   The patient does have a current PCP [name], MD) and does need an [facility] hospital follow-up appointment after discharge.  The patient does not have transportation limitations that hinder transportation to clinic appointments.  .Services Needed at time of discharge: Y = Yes, Blank = No PT: No PT follow up;Supervision for mobility/OOB  OT: OT screened, no needs identified, will sign off   RN:   Equipment: None recommended by PT   Other:     LOS: 2 days   Lesly Dukes, MD 03/10/2014, 7:47 AM  Lesly Dukes, MD  [email or web address] Pager # [phone] Office # [phone]

## 2014-03-10 NOTE — Care Management Note (Signed)
    Page 1 of 1   03/10/2014     11:02:09 AM   CARE MANAGEMENT NOTE 03/10/2014  Patient:  Nicole Mccormick, Nicole Mccormick   Account Number:  [id number]  Date Initiated:  03/09/2014  Documentation initiated by:  Marvetta Gibbons  Subjective/Objective Assessment:   Pt admitted with HA and HTN urgency     Action/Plan:   PTA pt lived at home with children- has [facility] aide 3x-week   Anticipated DC Date:  03/10/2014   Anticipated DC Plan:  [place]  CM consult      Choice offered to / List presented to:             Status of service:  Completed, signed off Medicare Important Message given?   (If response is "NO", the following Medicare IM given date fields will be blank) Date Medicare IM given:   Date Additional Medicare IM given:    Discharge Disposition:  HOME/SELF CARE  Per UR Regulation:  Reviewed for med. necessity/level of care/duration of stay  If discussed at [place] of Stay Meetings, dates discussed:    Comments:  03/10/14 [place], BSN [phone] patient has an aide 3x/week per previous NCM note.  Patient dc to home today.

## 2014-03-15 LAB — ALDOSTERONE + RENIN ACTIVITY W/ RATIO
ALDO / PRA Ratio: 2.2 Ratio (ref 0.9–28.9)
Aldosterone: 4 ng/dL
PRA LC/MS/MS: 1.84 ng/mL/h (ref 0.25–5.82)

## 2014-03-17 ENCOUNTER — Encounter: Payer: Self-pay | Admitting: Internal Medicine

## 2014-03-17 ENCOUNTER — Ambulatory Visit (INDEPENDENT_AMBULATORY_CARE_PROVIDER_SITE_OTHER): Payer: PRIVATE HEALTH INSURANCE | Admitting: Internal Medicine

## 2014-03-17 VITALS — BP 147/80 | HR 61 | Temp 97.3°F | Wt 133.8 lb

## 2014-03-17 DIAGNOSIS — I10 Essential (primary) hypertension: Secondary | ICD-10-CM

## 2014-03-17 MED ORDER — BENZONATATE 100 MG PO CAPS: 100.0000 mg | ORAL_CAPSULE | Freq: Two times a day (BID) | ORAL | Status: DC | PRN

## 2014-03-17 NOTE — Progress Notes (Signed)
Patient ID: Nicole Mccormick, female   DOB: [birth year], 67 y.o.   MRN: [id number]    Subjective:   Patient ID: Nicole Mccormick female   DOB: [birth year] 68 y.o.   MRN: [id number]  HPI: [name] is a 68 y.o. woman who presents for hospital f/u. She has done well since discharge after receiving treatment for htn emergency. Patients BP was well controlled voer the last week (140-150). She reports medication compliance. No HA, Chest pain, SOB. No changes in vision or bladder function.    Past Medical History  Diagnosis Date  . Hypertension   . Abnormal Pap smear   . Asthma   . Coronary artery disease   . HIV infection   . Varicosities   . Seizures     last sz fri nov 2  . GERD (gastroesophageal reflux disease)     previously on aciphex, discontinued november 2012  because patient asymptomatic, and concern about interference with HIV meds.  May try pepcid in the future if symptoms return  . Iron deficiency anemia     Ferritin = 2 in november 2012, started on iron supplemenation  . Arthritis   . CHF (congestive heart failure)   . Heart murmur    Current Outpatient Prescriptions  Medication Sig Dispense Refill  . acetaminophen-codeine (TYLENOL #3) 300-30 MG per tablet Take 1 tablet by mouth every 8 (eight) hours as needed for moderate pain or severe pain.  30 tablet  0  . acyclovir (ZOVIRAX) 400 MG tablet Take 400 mg by mouth as needed (for flare ups).       Marland Kitchen amLODipine (NORVASC) 10 MG tablet Take 1 tablet (10 mg total) by mouth daily.  30 tablet  11  . aspirin EC 81 MG tablet Take 1 tablet (81 mg total) by mouth daily.  30 tablet  6  . atazanavir (REYATAZ) 200 MG capsule Take 400 mg by mouth daily with breakfast. **SPACED 12 HOURS FROM ANTACIDS**      . baclofen (LIORESAL) 20 MG tablet Take 20 mg by mouth 3 (three) times daily.      Marland Kitchen emtricitabine-tenofovir (TRUVADA) 200-300 MG per tablet Take 1 tablet by mouth daily.  30 tablet  6  . gabapentin (NEURONTIN) 600 MG tablet Take 1 tablet (600  mg total) by mouth 3 (three) times daily.  90 tablet  3  . lisinopril-hydrochlorothiazide (PRINZIDE,ZESTORETIC) 20-12.5 MG per tablet Take 2 tablets by mouth daily.  60 tablet  6  . Nutritional Supplements (ENSURE NUTRA SHAKE HI-CAL) LIQD Take 1 Can by mouth 2 (two) times daily.  60 Can  6  . sucralfate (CARAFATE) 1 G tablet Take 1 tablet (1 g total) by mouth 4 (four) times daily.  120 tablet  0   No current facility-administered medications for this visit.   Family History  Problem Relation Age of Onset  . Diabetes Mother   . Ovarian cancer Sister   . Cancer Sister     ovarian and colon  . Colon cancer Neg Hx    History   Social History  . Marital Status: Single    Spouse Name: N/A    Number of Children: N/A  . Years of Education: N/A   Social History Main Topics  . Smoking status: Current Every Day Smoker -- 1.00 packs/day for 50 years    Types: Cigarettes  . Smokeless tobacco: Never Used     Comment: working on it.  . Alcohol Use: 1.2 oz/week    2 Cans of beer  per week     Comment: occasional beer; sometimes a glass of wine.  . Drug Use: No  . Sexual Activity: None     Comment: pt. given condoms   Other Topics Concern  . None   Social History Narrative  . None   Review of Systems: Pertinent items are noted in HPI. Objective:  Physical Exam: Filed Vitals:   03/17/14 1058  BP: 147/80  Pulse: 61  Temp: 97.3 F (36.3 C)  TempSrc: Oral  Weight: 133 lb 12.8 oz (60.691 kg)  SpO2: 98%   Physical Exam  Constitutional: She is oriented to person, place, and time. She appears well-developed and well-nourished.  HENT:  Head: Normocephalic and atraumatic.  Mouth/Throat: Oropharynx is clear and moist. No oropharyngeal exudate.  Cardiovascular: Normal rate, regular rhythm, normal heart sounds and intact distal pulses.  Exam reveals no friction rub.   No murmur heard. Pulmonary/Chest: Effort normal and breath sounds normal. No respiratory distress. She has no wheezes.    Neurological: She is alert and oriented to person, place, and time.  Psychiatric: She has a normal mood and affect. Her behavior is normal.    Assessment & Plan:

## 2014-03-17 NOTE — Addendum Note (Signed)
Addended by: Marrion Coy B on: 03/17/2014 11:48 AM   Modules accepted: Level of Service

## 2014-03-17 NOTE — Patient Instructions (Signed)
Please continue the good work.  Please follow up with PCP in 4 weeks.   Sodium-Controlled Diet Sodium is a mineral. It is found in many foods. Sodium may be found naturally or added during the making of a food. The most common form of sodium is salt, which is made up of sodium and chloride. Reducing your sodium intake involves changing your eating habits. The following guidelines will help you reduce the sodium in your diet:  Stop using the salt shaker.  Use salt sparingly in cooking and baking.  Substitute with sodium-free seasonings and spices.  Do not use a salt substitute (potassium chloride) without your caregiver's permission.  Include a variety of fresh, unprocessed foods in your diet.  Limit the use of processed and convenience foods that are high in sodium. USE THE FOLLOWING FOODS SPARINGLY: Breads/Starches  Commercial bread stuffing, commercial pancake or waffle mixes, coating mixes. Waffles. Croutons. Prepared (boxed or frozen) potato, rice, or noodle mixes that contain salt or sodium. Salted [place] fries or hash browns. Salted popcorn, breads, crackers, chips, or snack foods. Vegetables  Vegetables canned with salt or prepared in cream, butter, or cheese sauces. Sauerkraut. Tomato or vegetable juices canned with salt.  Fresh vegetables are allowed if rinsed thoroughly. Fruit  Fruit is okay to eat. Meat and Meat Substitutes  Salted or smoked meats, such as bacon or Canadian bacon, chipped or corned beef, hot dogs, salt pork, luncheon meats, pastrami, ham, or sausage. Canned or smoked fish, poultry, or meat. Processed cheese or cheese spreads, blue or [place]quef[place] cheese. Battered or frozen fish products. Prepared spaghetti sauce. Baked beans. Reuben sandwiches. Salted nuts. Caviar. Milk  Limit buttermilk to 1 cup per week. Soups and Combination Foods  Bouillon cubes, canned or dried soups, broth, consomm. Convenience (frozen or packaged) dinners with more than 600 mg  sodium. Pot pies, pizza, Asian food, fast food cheeseburgers, and specialty sandwiches. Desserts and Sweets  Regular (salted) desserts, pie, commercial fruit snack pies, commercial snack cakes, canned puddings.  Eat desserts and sweets in moderation. Fats and Oils  Gravy mixes or canned gravy. No more than 1 to 2 tbs of salad dressing. Chip dips.  Eat fats and oils in moderation. Beverages  See those listed under the vegetables and milk groups. Condiments  Ketchup, mustard, meat sauces, salsa, regular (salted) and lite soy sauce or mustard. Dill pickles, olives, meat tenderizer. Prepared horseradish or pickle relish. Dutch-processed cocoa. Baking powder or baking soda used medicinally. Worcestershire sauce. "Light" salt. Salt substitute, unless approved by your caregiver. Document Released: 05/17/2002 Document Revised: 02/17/2012 Document Reviewed: 12/18/2009 [facility] Patient Information 2014 [place], [place].

## 2014-03-17 NOTE — Assessment & Plan Note (Signed)
BP mildly elevated today. Patients is tolerating meds well. Plan to increase sodium restriction in diet and increase activity (walking). No additional meds at this time. F/U with PCP in 4 weeks.

## 2014-03-18 NOTE — Progress Notes (Signed)
Case discussed with Dr. Komanski at the time of the visit.  We reviewed the resident's history and exam and pertinent patient test results.  I agree with the assessment, diagnosis, and plan of care documented in the resident's note.      

## 2014-03-21 ENCOUNTER — Ambulatory Visit (INDEPENDENT_AMBULATORY_CARE_PROVIDER_SITE_OTHER): Payer: PRIVATE HEALTH INSURANCE | Admitting: Infectious Diseases

## 2014-03-21 ENCOUNTER — Encounter: Payer: Self-pay | Admitting: Infectious Diseases

## 2014-03-21 VITALS — BP 167/77 | HR 57 | Temp 98.2°F | Ht 63.0 in | Wt 132.0 lb

## 2014-03-21 DIAGNOSIS — I10 Essential (primary) hypertension: Secondary | ICD-10-CM

## 2014-03-21 DIAGNOSIS — B20 Human immunodeficiency virus [HIV] disease: Secondary | ICD-10-CM

## 2014-03-21 DIAGNOSIS — R87622 Low grade squamous intraepithelial lesion on cytologic smear of vagina (LGSIL): Secondary | ICD-10-CM

## 2014-03-21 NOTE — Assessment & Plan Note (Signed)
She is still not controlled. She is going to work on low salt diet. My great appreciation to IM for their efforts with her.

## 2014-03-21 NOTE — Assessment & Plan Note (Signed)
She is doing well. Her CD4 is good and she is undetectable. She is offered/refuses condoms. Will check Hep B S Ab at next lab. Will see her back in 6 months.

## 2014-03-21 NOTE — Assessment & Plan Note (Signed)
Will put her on PAP list.

## 2014-03-21 NOTE — Progress Notes (Signed)
   Subjective:    Patient ID: Nicole Mccormick, female    DOB: March 03, 1946, 68 y.o.   MRN: [id number]  HPI 68 yo F with hx of HIV+ (on unboosted ATV/TRV). Was in hospital 3-31 to 4-2 with hypertensive emergency (BP > 200, headache, nausea). Her BP was controlled with hydralazine then she was changed back to norvasc (she had missed this) and lisinopril/hctz. Her BP was improved and she was able to be d/c home. Her troponins were (-).  Feeling better. No more headaches, nausea. Is having shoulder pain (may have repair?). Afraid of having surgery. Also has had lump in her L calf with walking. Has lost a few pounds (planning to gain that back).    HIV 1 RNA Quant (copies/mL)  Date Value  03/01/2014 <20   12/29/2013 27*  06/28/2013 <20      CD4 T Cell Abs (/uL)  Date Value  03/01/2014 570   12/29/2013 530   06/28/2013 460     Review of Systems  Constitutional: Negative for fever and appetite change.  Cardiovascular: Negative for chest pain.  Gastrointestinal: Negative for nausea, diarrhea and constipation.  Genitourinary: Negative for difficulty urinating.  Neurological: Negative for headaches.       Objective:   Physical Exam  Constitutional: She appears well-developed and well-nourished.  HENT:  Mouth/Throat: No oropharyngeal exudate.  Eyes: EOM are normal. Pupils are equal, round, and reactive to light.  Neck: Neck supple.  Cardiovascular: Normal rate, regular rhythm and normal heart sounds.   Pulmonary/Chest: Effort normal and breath sounds normal.  Abdominal: Soft. Bowel sounds are normal. She exhibits no distension. There is no tenderness.  Musculoskeletal:       Legs: Lymphadenopathy:    She has no cervical adenopathy.          Assessment & Plan:

## 2014-04-08 ENCOUNTER — Telehealth: Payer: Self-pay | Admitting: *Deleted

## 2014-04-08 ENCOUNTER — Ambulatory Visit: Payer: PRIVATE HEALTH INSURANCE

## 2014-04-08 NOTE — Telephone Encounter (Signed)
Requested pt call [facility] for a new PAP smear appt.

## 2014-04-14 ENCOUNTER — Encounter: Payer: PRIVATE HEALTH INSURANCE | Admitting: Internal Medicine

## 2014-04-18 ENCOUNTER — Telehealth: Payer: Self-pay | Admitting: *Deleted

## 2014-04-18 ENCOUNTER — Other Ambulatory Visit: Payer: Self-pay | Admitting: *Deleted

## 2014-04-18 DIAGNOSIS — B20 Human immunodeficiency virus [HIV] disease: Secondary | ICD-10-CM

## 2014-04-18 NOTE — Telephone Encounter (Signed)
Pt called requesting a prescription for Ensure to be faxed to [facility].  Pt's BMI is 23.4. Pt notified. Landis Gandy, RN

## 2014-04-18 NOTE — Telephone Encounter (Signed)
Pt requested refill on Ensure, I called [facility] and was informed that this can only be filled by ID doctor.  Pt informed

## 2014-05-23 ENCOUNTER — Encounter: Payer: Self-pay | Admitting: Sports Medicine

## 2014-05-23 ENCOUNTER — Ambulatory Visit (INDEPENDENT_AMBULATORY_CARE_PROVIDER_SITE_OTHER): Payer: PRIVATE HEALTH INSURANCE | Admitting: Sports Medicine

## 2014-05-23 VITALS — BP 144/70 | Ht 62.0 in | Wt 145.0 lb

## 2014-05-23 DIAGNOSIS — M25512 Pain in left shoulder: Secondary | ICD-10-CM

## 2014-05-23 DIAGNOSIS — M25519 Pain in unspecified shoulder: Secondary | ICD-10-CM

## 2014-05-23 MED ORDER — ACETAMINOPHEN-CODEINE #3 300-30 MG PO TABS: 1.0000 | ORAL_TABLET | Freq: Three times a day (TID) | ORAL | Status: DC | PRN

## 2014-05-23 NOTE — Progress Notes (Signed)
Garret Reddish, MD Phone: [phone]  Subjective:   Nicole Mccormick is a 68 y.o. year old very pleasant female patient who presents with the following:  Shoulder Pain Patient states over last month she has had 9-10/10 aching pain of her left shoulder worse with movement. She states she can no longer tolerate the pain and would like to pursue surgical interventions. She states she has discussed with primary care doctor who also recommends surgery. She has had only 1-2 week relief with subacromial and intraarticular shoulder injections. She believes she has been cleared for surgery (but asked her to touch base to make sure). She is having difficulty lifting her arm but no decrease in ROM from last visit. She uses tylenol #3 but recently ran out so pain has been worse.  ROS- no fever/chills/redness over joint  Past Medical History- tendinopathy of left rotator cuff, CAD, HIV, tobacco abuse, GERD  Medications- reviewed and updated Current Outpatient Prescriptions  Medication Sig Dispense Refill  . acetaminophen-codeine (TYLENOL #3) 300-30 MG per tablet Take 1 tablet by mouth every 8 (eight) hours as needed for moderate pain or severe pain.  30 tablet  0  . acyclovir (ZOVIRAX) 400 MG tablet Take 400 mg by mouth as needed (for flare ups).       Marland Kitchen amLODipine (NORVASC) 10 MG tablet Take 1 tablet (10 mg total) by mouth daily.  30 tablet  11  . aspirin EC 81 MG tablet Take 1 tablet (81 mg total) by mouth daily.  30 tablet  6  . atazanavir (REYATAZ) 200 MG capsule Take 400 mg by mouth daily with breakfast. **SPACED 12 HOURS FROM ANTACIDS**      . baclofen (LIORESAL) 20 MG tablet Take 20 mg by mouth 3 (three) times daily.      . benzonatate (TESSALON) 100 MG capsule Take 1 capsule (100 mg total) by mouth 2 (two) times daily as needed for cough.  40 capsule  0  . cloNIDine (CATAPRES - DOSED IN MG/24 HR) 0.3 mg/24hr patch       . emtricitabine-tenofovir (TRUVADA) 200-300 MG per tablet Take 1 tablet by mouth  daily.  30 tablet  6  . gabapentin (NEURONTIN) 600 MG tablet Take 1 tablet (600 mg total) by mouth 3 (three) times daily.  90 tablet  3  . lisinopril-hydrochlorothiazide (PRINZIDE,ZESTORETIC) 20-12.5 MG per tablet Take 2 tablets by mouth daily.  60 tablet  6  . Nutritional Supplements (ENSURE NUTRA SHAKE HI-CAL) LIQD Take 1 Can by mouth 2 (two) times daily.  60 Can  6  . sucralfate (CARAFATE) 1 G tablet Take 1 tablet (1 g total) by mouth 4 (four) times daily.  120 tablet  0   No current facility-administered medications for this visit.    Objective: BP 144/70  Ht 5\' 2"  (1.575 m)  Wt 145 lb (65.772 kg)  BMI 26.51 kg/m2 Gen: NAD, resting comfortably MSK: 45 degree abduction, 60 degree flexion at shoulder, pain at both of these points  Assessment/Plan:  Left shoulder pain MRI findings of left shoulder with moderate rotator cuff tendippoathy and biceps with longitudinal split type tear as well as degenerative labral tears and moderate synovitis. Short term relief <2 weeks with subacromial and intraarticular steroid injections. Did not improve with PT. Conservative measures to this point have failed. DIscussed and patient would like to pursue surgical management at this point given continued 9-10/10 pain at this point. We have referred her to Dr. Mardelle Matte today for consideration of arthroscopic surgery as well  as given her a refill of her tylenol #3.    Meds ordered this encounter  Medications  . acetaminophen-codeine (TYLENOL #3) 300-30 MG per tablet    Sig: Take 1 tablet by mouth every 8 (eight) hours as needed for moderate pain or severe pain.    Dispense:  30 tablet    Refill:  0   ____________________________________________________________________________________________________________________________________________________________________________________ Dr. Micheline Chapman has seen and evaluated the patient. We have discussed the history, exam, assessment, and plan as noted above. He agrees  with management.   Brayton Mars. Melanee Spry, MD, PGY3 05/23/2014 10:55 AM

## 2014-05-23 NOTE — Patient Instructions (Addendum)
Take tylenol #3 as needed.  We have referred you to Dr. Mardelle Matte to discuss surgical options (arthroscopic surgery) for your shoulder.    DR Mary Esther AT [facility] [place] [phone]

## 2014-05-23 NOTE — Assessment & Plan Note (Signed)
MRI findings of left shoulder with moderate rotator cuff tendippoathy and biceps with longitudinal split type tear as well as degenerative labral tears and moderate synovitis. Short term relief <2 weeks with subacromial and intraarticular steroid injections. Did not improve with PT. Conservative measures to this point have failed. DIscussed and patient would like to pursue surgical management at this point given continued 9-10/10 pain at this point. We have referred her to Dr. Mardelle Matte today for consideration of arthroscopic surgery as well as given her a refill of her tylenol #3.

## 2014-06-12 ENCOUNTER — Other Ambulatory Visit: Payer: Self-pay | Admitting: Internal Medicine

## 2014-06-13 ENCOUNTER — Other Ambulatory Visit: Payer: Self-pay | Admitting: *Deleted

## 2014-06-13 DIAGNOSIS — B20 Human immunodeficiency virus [HIV] disease: Secondary | ICD-10-CM

## 2014-06-13 MED ORDER — ENSURE NUTRA SHAKE HI-CAL PO LIQD: 1.0000 | Freq: Two times a day (BID) | ORAL | Status: DC

## 2014-06-14 NOTE — Telephone Encounter (Signed)
[phone] - pt aware.

## 2014-07-19 IMAGING — CT CT HEAD W/O CM
2 series · 16 of 30 positions shown, 18 images · non-contrast
Comparison: None.

CLINICAL DATA: Elevated blood pressure.

EXAM:
CT HEAD WITHOUT CONTRAST
TECHNIQUE: Contiguous axial images were obtained from the base of the skull
through the vertex without intravenous contrast.

[Series 2: head w/o · axial · non-contrast · 0.49mm/px · z∈[+51,+176]mm · 8 of 33 slices shown, 10 images]
[im 4/33  brain]
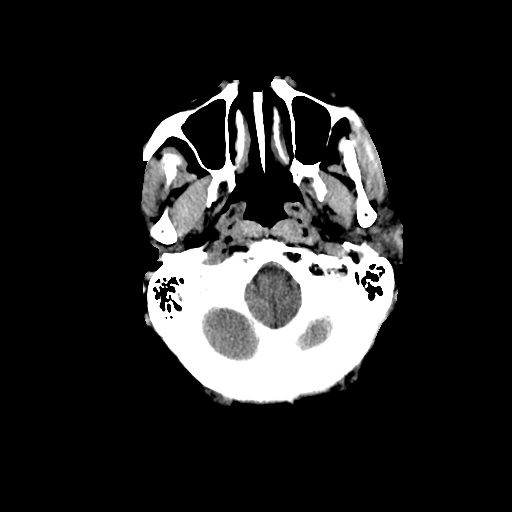
[im 4/33  bone]
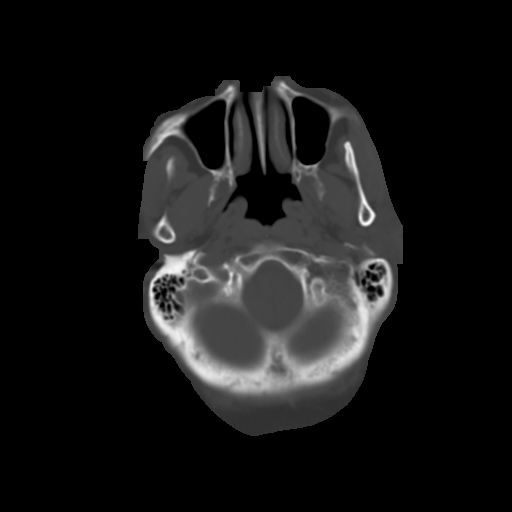
[im 8/33  brain]
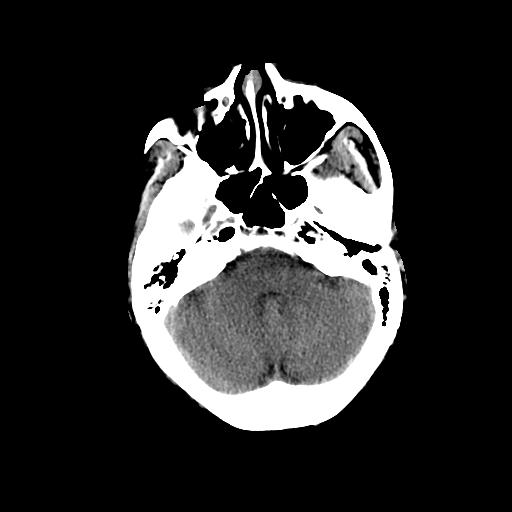
[im 11/33  brain]
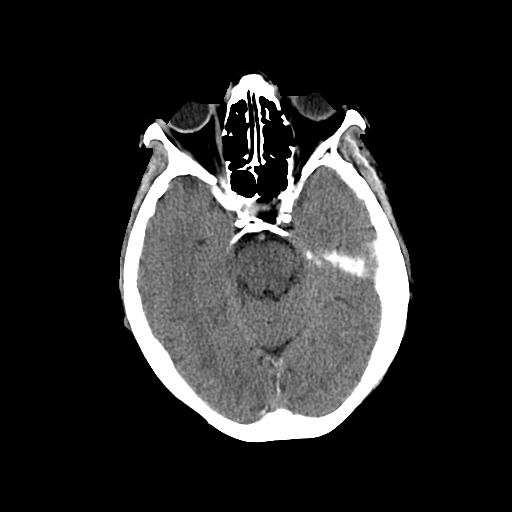
[im 15/33  brain]
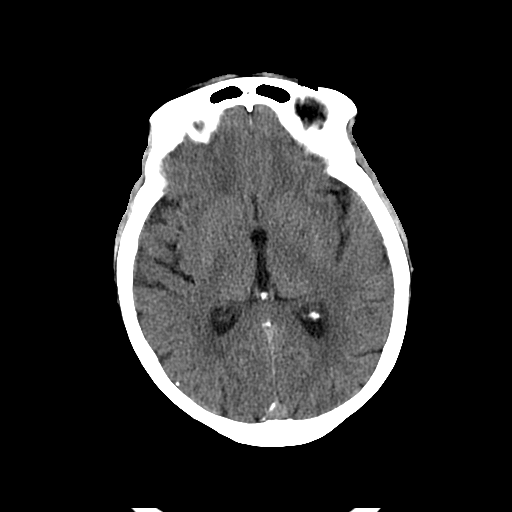
[im 18/33  brain]
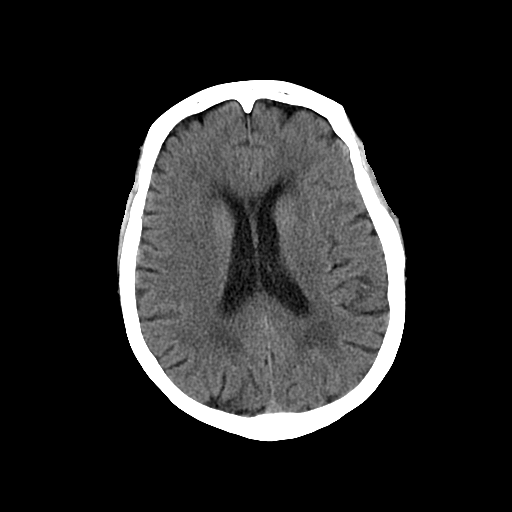
[im 18/33  bone]
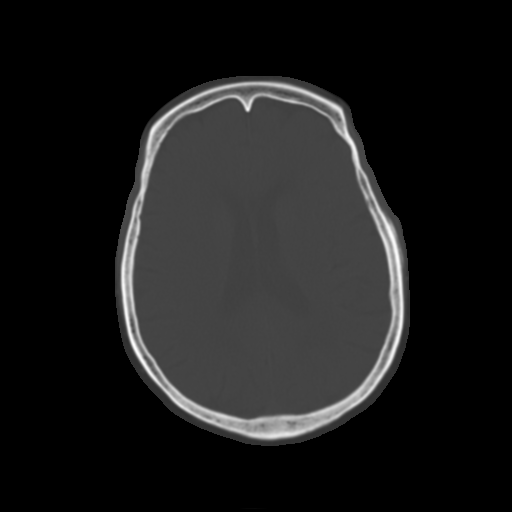
[im 22/33  brain]
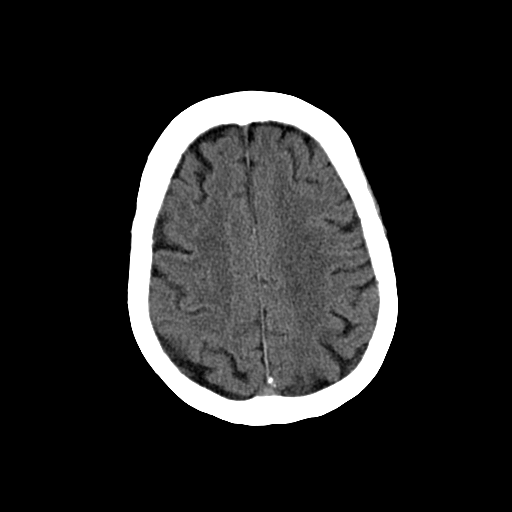
[im 25/33  brain]
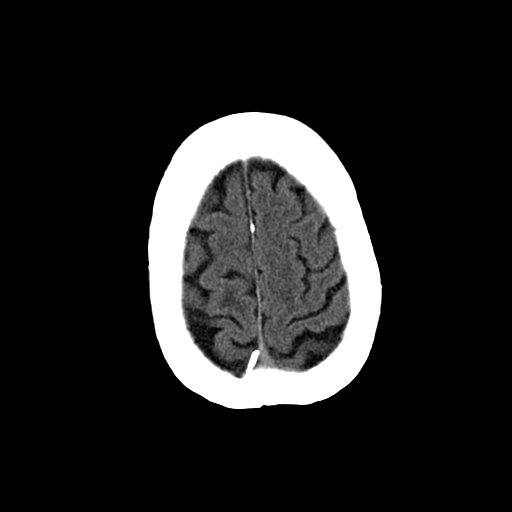
[im 29/33  brain]
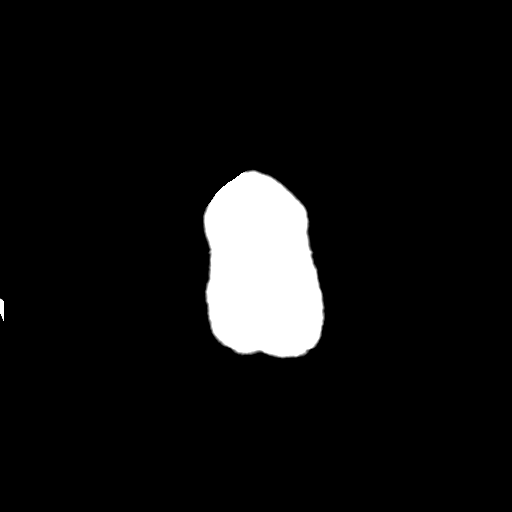

[Series 3: head w/o bone · axial · non-contrast · 0.49mm/px · z∈[+51,+179]mm · 8 of 65 slices shown]
[im 7/65  bone]
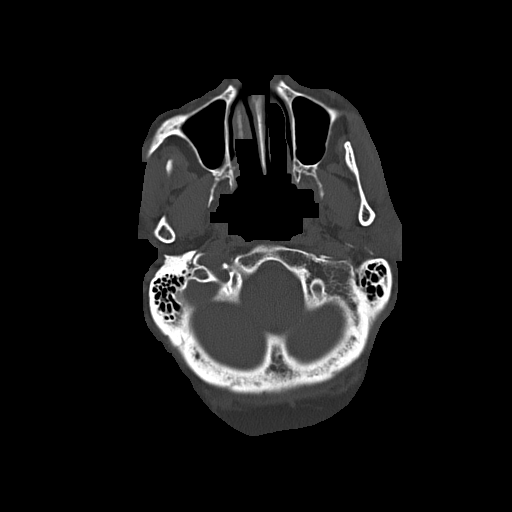
[im 14/65  bone]
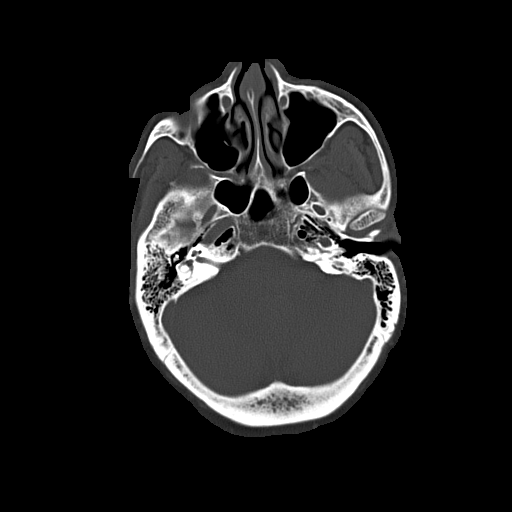
[im 21/65  bone]
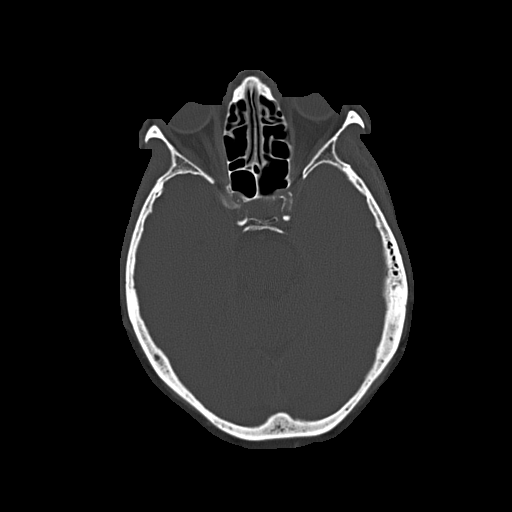
[im 27/65  bone]
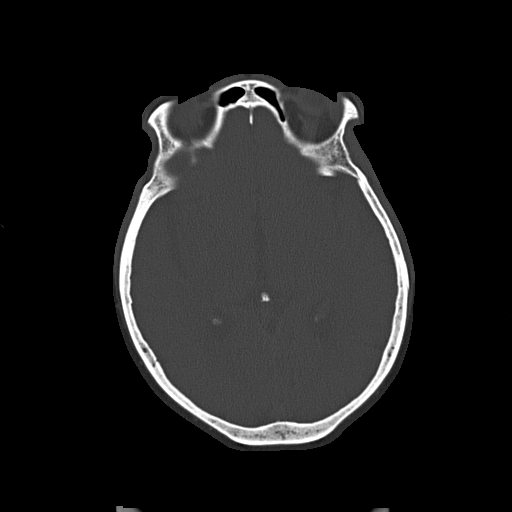
[im 38/65  bone]
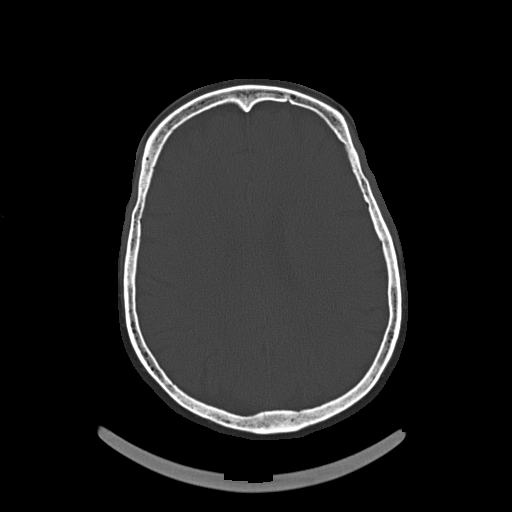
[im 44/65  bone]
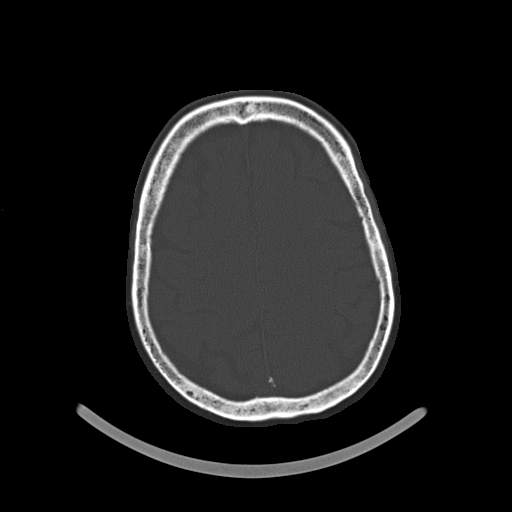
[im 51/65  bone]
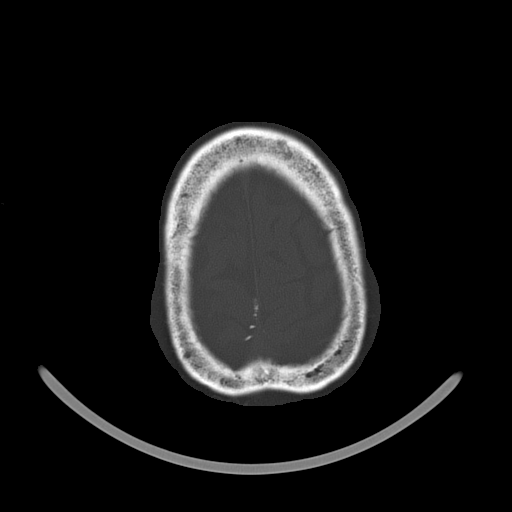
[im 58/65  bone]
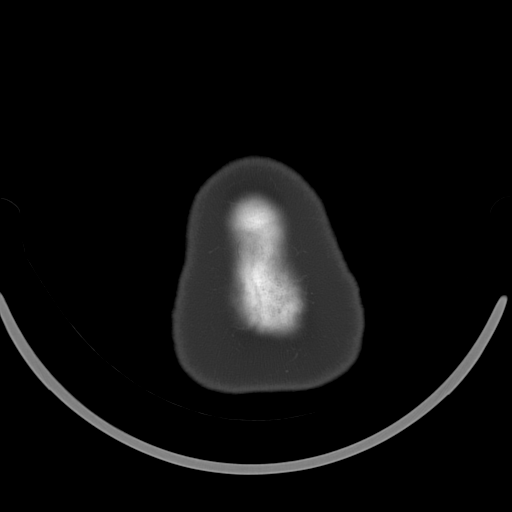

[16 of 30 positions shown; findings below may reference images not displayed]

FINDINGS: Skull and Sinuses:No significant abnormality.

Orbits: Right cataract resection.

Brain: No evidence of acute abnormality, such as acute infarction,
hemorrhage, hydrocephalus, or mass lesion/mass effect. There is
chronic small vessel disease with extensive bilateral cerebral white
matter low density, confluent around the frontal horns of the
lateral ventricles. Low attenuating structures in the bilateral
putamina are thus likely lacunes (rather than dilated perivascular
spaces).
IMPRESSION: 1. No evidence of acute intracranial disease.
2. Chronic small vessel disease.

## 2014-07-19 IMAGING — CR DG CHEST 2V
2 series · 2 of 2 positions shown · non-contrast
Comparison: 01/01/2013

CLINICAL DATA: Cough at admission

EXAM:
CHEST  2 VIEW

[x chest ap]
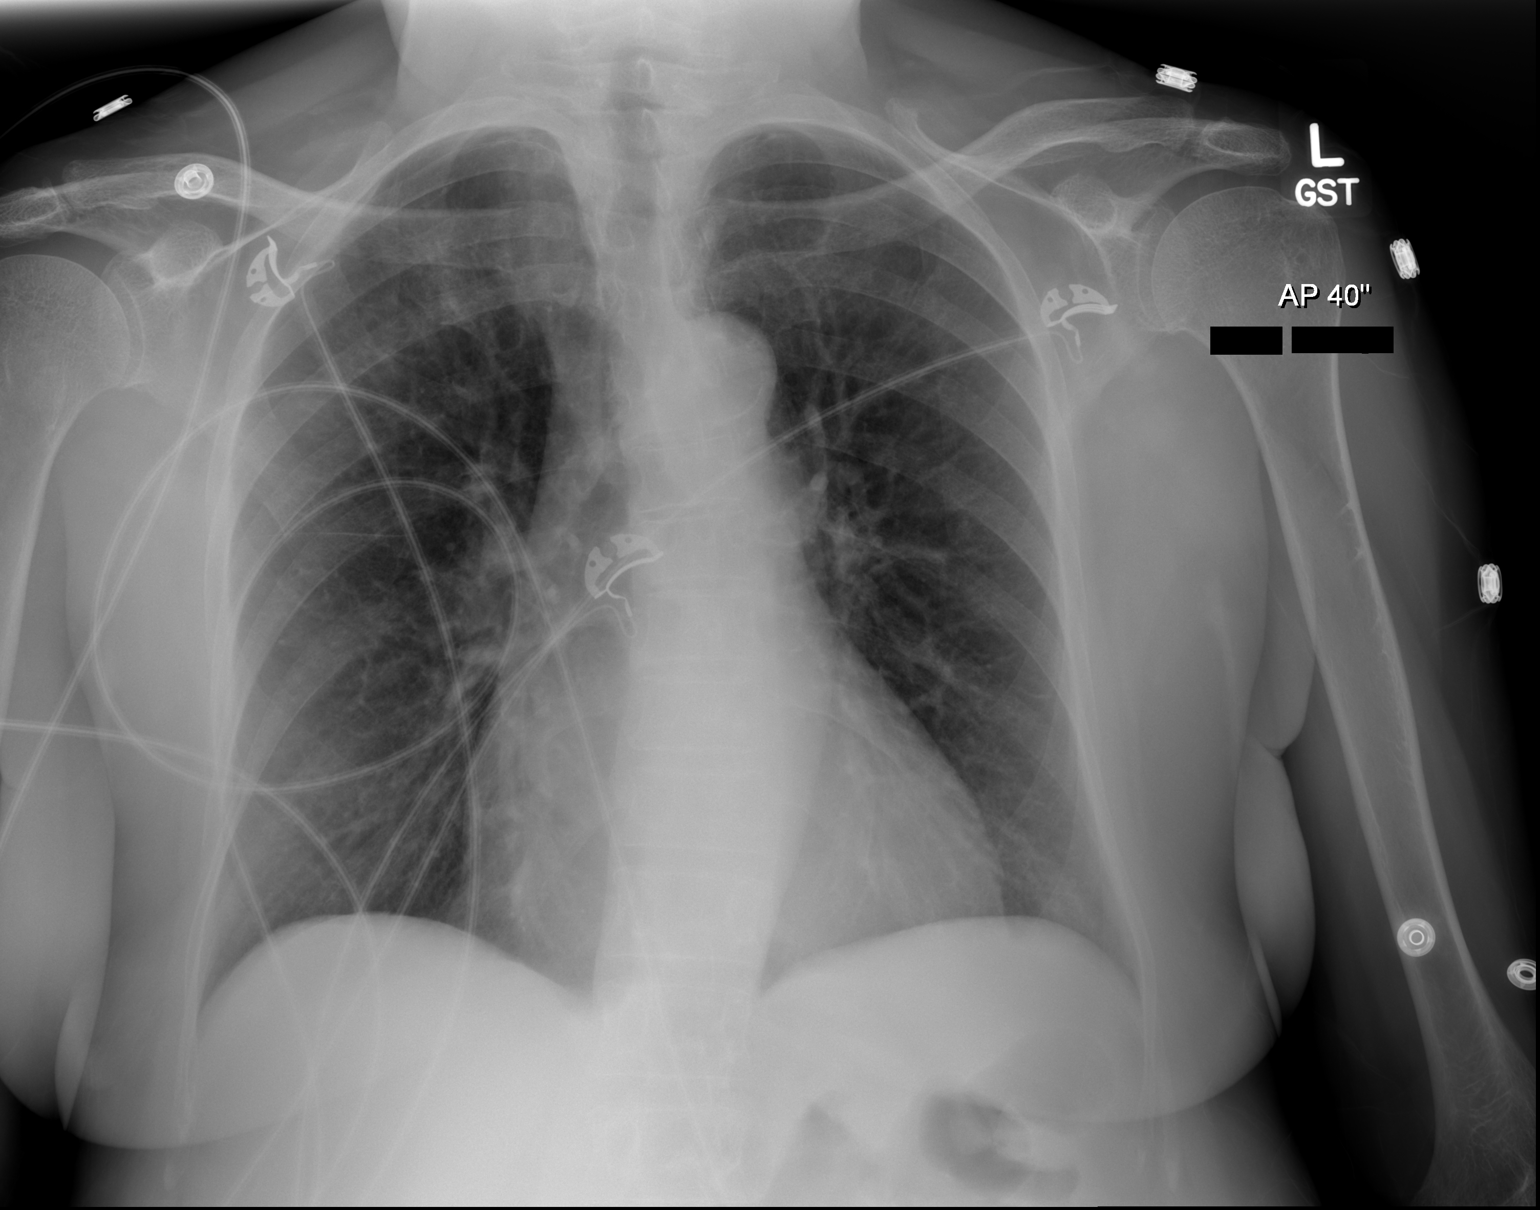

[w chest lat]
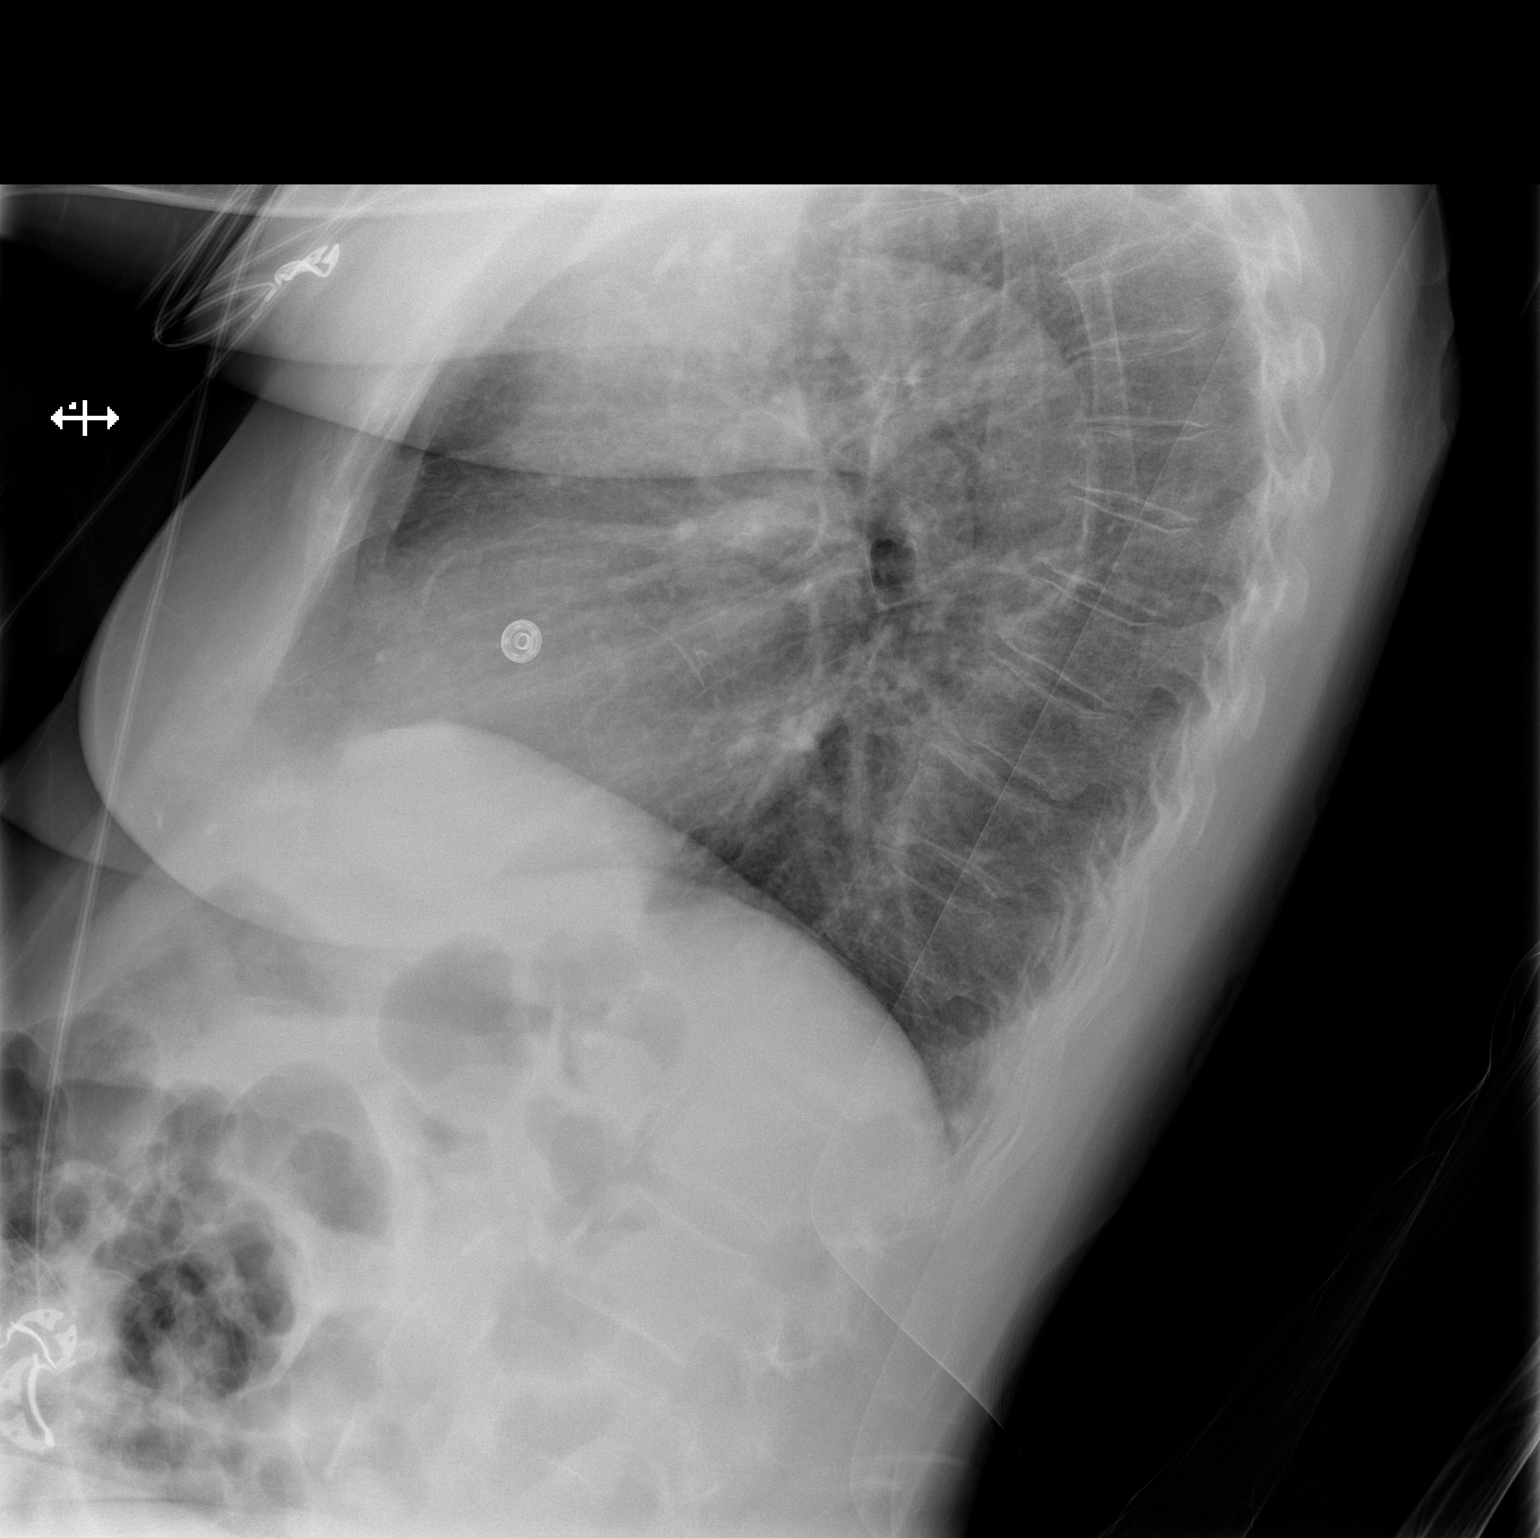

[2 of 2 positions shown; findings below may reference images not displayed]

FINDINGS: Borderline cardiomegaly, stable from prior. No edema or
consolidation. No effusion or pneumothorax. Mild pulmonary
hyperinflation. Atherosclerotic aortic calcification, likely
affecting the coronary arteries as well. Chronic, mild upper
thoracic vertebral body wedging.
IMPRESSION: No active cardiopulmonary disease.

## 2014-08-07 ENCOUNTER — Other Ambulatory Visit: Payer: Self-pay | Admitting: Infectious Diseases

## 2014-08-07 ENCOUNTER — Encounter (HOSPITAL_COMMUNITY): Payer: Self-pay | Admitting: Emergency Medicine

## 2014-08-07 ENCOUNTER — Observation Stay (HOSPITAL_COMMUNITY)
Admission: EM | Admit: 2014-08-07 | Discharge: 2014-08-08 | Disposition: A | Discharge status: Home / Self Care | Payer: PRIVATE HEALTH INSURANCE | Attending: Internal Medicine | Admitting: Internal Medicine

## 2014-08-07 ENCOUNTER — Emergency Department (HOSPITAL_COMMUNITY): Payer: PRIVATE HEALTH INSURANCE

## 2014-08-07 DIAGNOSIS — Z79899 Other long term (current) drug therapy: Secondary | ICD-10-CM | POA: Insufficient documentation

## 2014-08-07 DIAGNOSIS — Z880 Allergy status to penicillin: Secondary | ICD-10-CM | POA: Diagnosis not present

## 2014-08-07 DIAGNOSIS — I25119 Atherosclerotic heart disease of native coronary artery with unspecified angina pectoris: Secondary | ICD-10-CM

## 2014-08-07 DIAGNOSIS — D509 Iron deficiency anemia, unspecified: Secondary | ICD-10-CM | POA: Diagnosis present

## 2014-08-07 DIAGNOSIS — R112 Nausea with vomiting, unspecified: Secondary | ICD-10-CM | POA: Diagnosis not present

## 2014-08-07 DIAGNOSIS — B1920 Unspecified viral hepatitis C without hepatic coma: Secondary | ICD-10-CM | POA: Diagnosis present

## 2014-08-07 DIAGNOSIS — I2510 Atherosclerotic heart disease of native coronary artery without angina pectoris: Secondary | ICD-10-CM | POA: Insufficient documentation

## 2014-08-07 DIAGNOSIS — R011 Cardiac murmur, unspecified: Secondary | ICD-10-CM | POA: Diagnosis not present

## 2014-08-07 DIAGNOSIS — M129 Arthropathy, unspecified: Secondary | ICD-10-CM | POA: Diagnosis not present

## 2014-08-07 DIAGNOSIS — G40909 Epilepsy, unspecified, not intractable, without status epilepticus: Secondary | ICD-10-CM | POA: Diagnosis not present

## 2014-08-07 DIAGNOSIS — R079 Chest pain, unspecified: Principal | ICD-10-CM | POA: Diagnosis present

## 2014-08-07 DIAGNOSIS — I8390 Asymptomatic varicose veins of unspecified lower extremity: Secondary | ICD-10-CM | POA: Insufficient documentation

## 2014-08-07 DIAGNOSIS — I10 Essential (primary) hypertension: Secondary | ICD-10-CM | POA: Diagnosis not present

## 2014-08-07 DIAGNOSIS — Z21 Asymptomatic human immunodeficiency virus [HIV] infection status: Secondary | ICD-10-CM | POA: Diagnosis not present

## 2014-08-07 DIAGNOSIS — J45901 Unspecified asthma with (acute) exacerbation: Secondary | ICD-10-CM | POA: Insufficient documentation

## 2014-08-07 DIAGNOSIS — K219 Gastro-esophageal reflux disease without esophagitis: Secondary | ICD-10-CM | POA: Insufficient documentation

## 2014-08-07 DIAGNOSIS — B20 Human immunodeficiency virus [HIV] disease: Secondary | ICD-10-CM

## 2014-08-07 DIAGNOSIS — F17200 Nicotine dependence, unspecified, uncomplicated: Secondary | ICD-10-CM | POA: Diagnosis present

## 2014-08-07 DIAGNOSIS — R109 Unspecified abdominal pain: Secondary | ICD-10-CM | POA: Diagnosis not present

## 2014-08-07 DIAGNOSIS — D6489 Other specified anemias: Secondary | ICD-10-CM

## 2014-08-07 DIAGNOSIS — Z7982 Long term (current) use of aspirin: Secondary | ICD-10-CM | POA: Insufficient documentation

## 2014-08-07 LAB — CBC WITH DIFFERENTIAL/PLATELET
Basophils Absolute: 0 10*3/uL (ref 0.0–0.1)
Basophils Relative: 1 % (ref 0–1)
Eosinophils Absolute: 0.1 10*3/uL (ref 0.0–0.7)
Eosinophils Relative: 3 % (ref 0–5)
HCT: 26.5 % — ABNORMAL LOW (ref 36.0–46.0)
Hemoglobin: 7.9 g/dL — ABNORMAL LOW (ref 12.0–15.0)
Lymphocytes Relative: 40 % (ref 12–46)
Lymphs Abs: 1.9 10*3/uL (ref 0.7–4.0)
MCH: 22.4 pg — ABNORMAL LOW (ref 26.0–34.0)
MCHC: 29.8 g/dL — ABNORMAL LOW (ref 30.0–36.0)
MCV: 75.3 fL — ABNORMAL LOW (ref 78.0–100.0)
Monocytes Absolute: 0.5 10*3/uL (ref 0.1–1.0)
Monocytes Relative: 10 % (ref 3–12)
Neutro Abs: 2.3 10*3/uL (ref 1.7–7.7)
Neutrophils Relative %: 48 % (ref 43–77)
Platelets: 298 10*3/uL (ref 150–400)
RBC: 3.52 MIL/uL — ABNORMAL LOW (ref 3.87–5.11)
RDW: 17.4 % — ABNORMAL HIGH (ref 11.5–15.5)
WBC: 4.8 10*3/uL (ref 4.0–10.5)

## 2014-08-07 LAB — COMPREHENSIVE METABOLIC PANEL
ALT: 30 U/L (ref 0–35)
AST: 39 U/L — ABNORMAL HIGH (ref 0–37)
Albumin: 3.1 g/dL — ABNORMAL LOW (ref 3.5–5.2)
Alkaline Phosphatase: 67 U/L (ref 39–117)
Anion gap: 10 (ref 5–15)
BUN: 14 mg/dL (ref 6–23)
CO2: 26 mEq/L (ref 19–32)
Calcium: 8.6 mg/dL (ref 8.4–10.5)
Chloride: 101 mEq/L (ref 96–112)
Creatinine, Ser: 0.76 mg/dL (ref 0.50–1.10)
GFR calc Af Amer: >90 mL/min (ref >90)
GFR calc non Af Amer: 83 mL/min — ABNORMAL LOW (ref >90)
Glucose, Bld: 110 mg/dL — ABNORMAL HIGH (ref 70–99)
Potassium: 3.6 mEq/L — ABNORMAL LOW (ref 3.7–5.3)
Sodium: 137 mEq/L (ref 137–147)
Total Bilirubin: 0.6 mg/dL (ref 0.3–1.2)
Total Protein: 7.3 g/dL (ref 6.0–8.3)

## 2014-08-07 LAB — IRON AND TIBC
Iron: 11 ug/dL — ABNORMAL LOW (ref 42–135)
Saturation Ratios: 2 % — ABNORMAL LOW (ref 20–55)
TIBC: 538 ug/dL — ABNORMAL HIGH (ref 250–470)
UIBC: 527 ug/dL — ABNORMAL HIGH (ref 125–400)

## 2014-08-07 LAB — URINALYSIS, ROUTINE W REFLEX MICROSCOPIC
Bilirubin Urine: NEGATIVE
Glucose, UA: NEGATIVE mg/dL
Hgb urine dipstick: NEGATIVE
Ketones, ur: NEGATIVE mg/dL
Leukocytes, UA: NEGATIVE
Nitrite: NEGATIVE
Protein, ur: NEGATIVE mg/dL
Specific Gravity, Urine: 1.016 (ref 1.005–1.030)
Urobilinogen, UA: 1 mg/dL (ref 0.0–1.0)
pH: 7 (ref 5.0–8.0)

## 2014-08-07 LAB — LIPASE, BLOOD: Lipase: 79 U/L — ABNORMAL HIGH (ref 11–59)

## 2014-08-07 LAB — FOLATE: Folate: 13 ng/mL

## 2014-08-07 LAB — VITAMIN B12: Vitamin B-12: 508 pg/mL (ref 211–911)

## 2014-08-07 LAB — RETICULOCYTES
RBC.: 3.68 MIL/uL — ABNORMAL LOW (ref 3.87–5.11)
Retic Count, Absolute: 47.8 10*3/uL (ref 19.0–186.0)
Retic Ct Pct: 1.3 % (ref 0.4–3.1)

## 2014-08-07 LAB — I-STAT CG4 LACTIC ACID, ED: Lactic Acid, Venous: 0.56 mmol/L (ref 0.5–2.2)

## 2014-08-07 LAB — I-STAT TROPONIN, ED: Troponin i, poc: 0 ng/mL (ref 0.00–0.08)

## 2014-08-07 LAB — POC OCCULT BLOOD, ED: Fecal Occult Bld: NEGATIVE

## 2014-08-07 LAB — TYPE AND SCREEN
ABO/RH(D): O POS
Antibody Screen: NEGATIVE

## 2014-08-07 LAB — TROPONIN I: Troponin I: <0.3 ng/mL (ref <0.30)

## 2014-08-07 LAB — FERRITIN: Ferritin: 3 ng/mL — ABNORMAL LOW (ref 10–291)

## 2014-08-07 MED ORDER — ASPIRIN EC 81 MG PO TBEC
81.0000 mg | DELAYED_RELEASE_TABLET | Freq: Every day | ORAL | Status: DC
  Administered 2014-08-08: 81 mg via ORAL
  Filled 2014-08-07: qty 1

## 2014-08-07 MED ORDER — DOCUSATE SODIUM 100 MG PO CAPS
100.0000 mg | ORAL_CAPSULE | Freq: Two times a day (BID) | ORAL | Status: DC
  Administered 2014-08-07 – 2014-08-08 (×2): 100 mg via ORAL
  Filled 2014-08-07 (×3): qty 1

## 2014-08-07 MED ORDER — ASPIRIN 81 MG PO CHEW
324.0000 mg | CHEWABLE_TABLET | Freq: Once | ORAL | Status: AC
  Administered 2014-08-07: 324 mg via ORAL
  Filled 2014-08-07: qty 4

## 2014-08-07 MED ORDER — BACLOFEN 20 MG PO TABS
20.0000 mg | ORAL_TABLET | Freq: Three times a day (TID) | ORAL | Status: DC
  Administered 2014-08-07 – 2014-08-08 (×2): 20 mg via ORAL
  Filled 2014-08-07 (×4): qty 1

## 2014-08-07 MED ORDER — SODIUM CHLORIDE 0.9 % IJ SOLN: 3.0000 mL | INTRAMUSCULAR | Status: DC | PRN

## 2014-08-07 MED ORDER — SODIUM CHLORIDE 0.9 % IJ SOLN: 3.0000 mL | Freq: Two times a day (BID) | INTRAMUSCULAR | Status: DC

## 2014-08-07 MED ORDER — HYDROCHLOROTHIAZIDE 25 MG PO TABS
25.0000 mg | ORAL_TABLET | Freq: Every day | ORAL | Status: DC
  Administered 2014-08-08: 25 mg via ORAL
  Filled 2014-08-07: qty 1

## 2014-08-07 MED ORDER — SODIUM CHLORIDE 0.9 % IV SOLN: 250.0000 mL | INTRAVENOUS | Status: DC | PRN

## 2014-08-07 MED ORDER — GABAPENTIN 600 MG PO TABS
600.0000 mg | ORAL_TABLET | Freq: Three times a day (TID) | ORAL | Status: DC
  Administered 2014-08-07 – 2014-08-08 (×2): 600 mg via ORAL
  Filled 2014-08-07 (×4): qty 1

## 2014-08-07 MED ORDER — AMLODIPINE BESYLATE 10 MG PO TABS
10.0000 mg | ORAL_TABLET | Freq: Every day | ORAL | Status: DC
  Administered 2014-08-08: 10 mg via ORAL
  Filled 2014-08-07: qty 1

## 2014-08-07 MED ORDER — FERROUS SULFATE 325 (65 FE) MG PO TABS
325.0000 mg | ORAL_TABLET | Freq: Three times a day (TID) | ORAL | Status: DC
  Administered 2014-08-08 (×2): 325 mg via ORAL
  Filled 2014-08-07 (×4): qty 1

## 2014-08-07 MED ORDER — ATAZANAVIR SULFATE 200 MG PO CAPS
400.0000 mg | ORAL_CAPSULE | Freq: Every day | ORAL | Status: DC
  Administered 2014-08-08: 400 mg via ORAL
  Filled 2014-08-07 (×2): qty 2

## 2014-08-07 MED ORDER — ONDANSETRON HCL 4 MG/2ML IJ SOLN
4.0000 mg | Freq: Once | INTRAMUSCULAR | Status: AC
  Administered 2014-08-07: 4 mg via INTRAVENOUS
  Filled 2014-08-07: qty 2

## 2014-08-07 MED ORDER — GI COCKTAIL ~~LOC~~
30.0000 mL | Freq: Two times a day (BID) | ORAL | Status: DC | PRN
  Administered 2014-08-08: 30 mL via ORAL
  Filled 2014-08-07 (×2): qty 30

## 2014-08-07 MED ORDER — LISINOPRIL 40 MG PO TABS
40.0000 mg | ORAL_TABLET | Freq: Every day | ORAL | Status: DC
  Administered 2014-08-08: 40 mg via ORAL
  Filled 2014-08-07: qty 1

## 2014-08-07 MED ORDER — ACETAMINOPHEN-CODEINE #3 300-30 MG PO TABS: 1.0000 | ORAL_TABLET | Freq: Three times a day (TID) | ORAL | Status: DC | PRN

## 2014-08-07 MED ORDER — SODIUM CHLORIDE 0.9 % IJ SOLN
3.0000 mL | Freq: Two times a day (BID) | INTRAMUSCULAR | Status: DC
  Administered 2014-08-07: 3 mL via INTRAVENOUS

## 2014-08-07 MED ORDER — PROMETHAZINE HCL 12.5 MG PO TABS: 12.5000 mg | ORAL_TABLET | Freq: Four times a day (QID) | ORAL | Status: DC | PRN

## 2014-08-07 MED ORDER — EMTRICITABINE-TENOFOVIR DF 200-300 MG PO TABS
1.0000 | ORAL_TABLET | Freq: Every day | ORAL | Status: DC
  Filled 2014-08-07: qty 1

## 2014-08-07 MED ORDER — SUCRALFATE 1 G PO TABS
1.0000 g | ORAL_TABLET | Freq: Four times a day (QID) | ORAL | Status: DC
  Administered 2014-08-07 – 2014-08-08 (×2): 1 g via ORAL
  Filled 2014-08-07 (×5): qty 1

## 2014-08-07 MED ORDER — HEPARIN SODIUM (PORCINE) 5000 UNIT/ML IJ SOLN
5000.0000 [IU] | Freq: Three times a day (TID) | INTRAMUSCULAR | Status: DC
  Filled 2014-08-07 (×3): qty 1

## 2014-08-07 MED ORDER — PANTOPRAZOLE SODIUM 40 MG PO TBEC: 40.0000 mg | DELAYED_RELEASE_TABLET | Freq: Every day | ORAL | Status: DC

## 2014-08-07 MED ORDER — ACYCLOVIR 400 MG PO TABS
400.0000 mg | ORAL_TABLET | Freq: Every day | ORAL | Status: DC | PRN
  Filled 2014-08-07: qty 1

## 2014-08-07 MED ORDER — NITROGLYCERIN 0.4 MG SL SUBL
0.4000 mg | SUBLINGUAL_TABLET | SUBLINGUAL | Status: DC | PRN
  Administered 2014-08-07: 0.4 mg via SUBLINGUAL
  Filled 2014-08-07: qty 1

## 2014-08-07 MED ORDER — LISINOPRIL-HYDROCHLOROTHIAZIDE 20-12.5 MG PO TABS: 2.0000 | ORAL_TABLET | Freq: Every day | ORAL | Status: DC

## 2014-08-07 MED ORDER — IOHEXOL 300 MG/ML  SOLN
25.0000 mL | INTRAMUSCULAR | Status: DC | PRN
Start: 2014-08-07 — End: 2014-08-07

## 2014-08-07 NOTE — ED Provider Notes (Signed)
CSN: [id number]     Arrival date & time 08/07/14  1405 History   First MD Initiated Contact with Patient 08/07/14 1422     Chief Complaint  Patient presents with  . Abdominal Pain  . Chest Pain     (Consider location/radiation/quality/duration/timing/severity/associated sxs/prior Treatment) HPI Comments: Patient is a 68 yo F PMHx significant for HTN, CAD, HIV, GERD, CHF, tobacco abuse, iron deficiency anemia presenting to the ED for acute onset epigastric pain with radiation into chest with associated nausea, non-bloody non-bilious emesis, and shortness of breath that began last evening at 8PM. Alleviating factors: none. Aggravating factors: none. Last echocardiogram, stress test 2-3 years ago. Viral loads have been undetectable.   Patient is a 68 y.o. female presenting with abdominal pain and chest pain.  Abdominal Pain Associated symptoms: chest pain, nausea, shortness of breath (exertional) and vomiting   Associated symptoms: no constipation and no diarrhea   Chest Pain Associated symptoms: abdominal pain, nausea, shortness of breath (exertional) and vomiting     Past Medical History  Diagnosis Date  . Hypertension   . Abnormal Pap smear   . Asthma   . Coronary artery disease   . HIV infection   . Varicosities   . Seizures     last sz fri nov 2  . GERD (gastroesophageal reflux disease)     previously on aciphex, discontinued november 2012  because patient asymptomatic, and concern about interference with HIV meds.  May try pepcid in the future if symptoms return  . Iron deficiency anemia     Ferritin = 2 in november 2012, started on iron supplemenation  . Arthritis   . CHF (congestive heart failure)   . Heart murmur    Past Surgical History  Procedure Laterality Date  . Abdominal hysterectomy    . Esophagogastroduodenoscopy  10/13/11    small hiatal hernia  . Colonoscopy  10/13/11    small adenoma, anal condyloma  . Colonoscopy  10/13/2011    Procedure: COLONOSCOPY;   Surgeon: Gatha Mayer, MD;  Location: [facility];  Service: Endoscopy;  Laterality: N/A;  . Esophagogastroduodenoscopy  10/13/2011    Procedure: ESOPHAGOGASTRODUODENOSCOPY (EGD);  Surgeon: Gatha Mayer, MD;  Location: [facility] ENDOSCOPY;  Service: Endoscopy;  Laterality: N/A;   Family History  Problem Relation Age of Onset  . Diabetes Mother   . Ovarian cancer Sister   . Cancer Sister     ovarian and colon  . Colon cancer Neg Hx    History  Substance Use Topics  . Smoking status: Current Every Day Smoker -- 0.50 packs/day for 50 years    Types: Cigarettes  . Smokeless tobacco: Never Used     Comment: working on it.  . Alcohol Use: 1.2 oz/week    2 Glasses of wine per week     Comment:  sometimes a glass of wine.   OB History   Grav Para Term Preterm Abortions TAB SAB Ect Mult Living   6 5   1  1   5      Review of Systems  Respiratory: Positive for shortness of breath (exertional).   Cardiovascular: Positive for chest pain.  Gastrointestinal: Positive for nausea, vomiting and abdominal pain. Negative for diarrhea, constipation, blood in stool and anal bleeding.  All other systems reviewed and are negative.     Allergies  Penicillins and Avelox  Home Medications   Prior to Admission medications   Medication Sig Start Date End Date Taking? Authorizing Provider  acetaminophen-codeine (TYLENOL #  3) 300-30 MG per tablet Take 1 tablet by mouth every 8 (eight) hours as needed for moderate pain or severe pain. 05/23/14  Yes Marin Olp, MD  acyclovir (ZOVIRAX) 400 MG tablet Take 400 mg by mouth daily as needed (for flare ups).  12/29/13  Yes Historical Provider, MD  amLODipine (NORVASC) 10 MG tablet Take 1 tablet (10 mg total) by mouth daily. 03/10/14  Yes Lesly Dukes, MD  aspirin EC 81 MG tablet Take 1 tablet (81 mg total) by mouth daily. 02/26/13  Yes [name], MD  atazanavir (REYATAZ) 200 MG capsule Take 400 mg by mouth daily with breakfast. **SPACED 12 HOURS FROM  ANTACIDS** 02/28/14  Yes Carlyle Basques, MD  baclofen (LIORESAL) 20 MG tablet Take 20 mg by mouth 3 (three) times daily. 03/04/14  Yes [name], MD  Calcium Carbonate Antacid (ANTACID PO) Take 1 tablet by mouth 2 (two) times daily as needed (acid reflux).   Yes Historical Provider, MD  emtricitabine-tenofovir (TRUVADA) 200-300 MG per tablet Take 1 tablet by mouth daily. 02/28/14  Yes Carlyle Basques, MD  gabapentin (NEURONTIN) 600 MG tablet Take 1 tablet (600 mg total) by mouth 3 (three) times daily. 03/04/14  Yes [name], MD  lisinopril-hydrochlorothiazide (PRINZIDE,ZESTORETIC) 20-12.5 MG per tablet Take 2 tablets by mouth daily. 03/04/14  Yes [name], MD  Nutritional Supplements (ENSURE NUTRA SHAKE HI-CAL) LIQD Take 1 Can by mouth 2 (two) times daily. 06/13/14  Yes [name], MD  sucralfate (CARAFATE) 1 G tablet Take 1 tablet (1 g total) by mouth 4 (four) times daily. 12/07/12  Yes Campbell Riches, MD   BP 186/75  Pulse 50  Temp(Src) 99.3 F (37.4 C) (Oral)  Resp 18  SpO2 99% Physical Exam  Constitutional: She is oriented to person, place, and time. She appears well-developed and well-nourished.  HENT:  Head: Normocephalic and atraumatic.  Right Ear: External ear normal.  Left Ear: External ear normal.  Nose: Nose normal.  Mouth/Throat: Oropharynx is clear and moist. No oropharyngeal exudate.  Eyes: Conjunctivae are normal.  Neck: Neck supple.  Cardiovascular: Regular rhythm, normal heart sounds and intact distal pulses.   Pulmonary/Chest: Effort normal and breath sounds normal. No respiratory distress. She exhibits no tenderness.  Abdominal: Soft. Bowel sounds are normal. She exhibits no distension. There is no tenderness. There is no rebound and no guarding.  Musculoskeletal: She exhibits no edema.  Neurological: She is alert and oriented to person, place, and time.  Skin: Skin is warm and dry. She is not diaphoretic.    ED Course  Procedures  (including critical care time) Medications  ondansetron (ZOFRAN) injection 4 mg (4 mg Intravenous Given 08/07/14 1443)  aspirin chewable tablet 324 mg (324 mg Oral Given 08/07/14 1441)    Labs Review Labs Reviewed  CBC WITH DIFFERENTIAL - Abnormal; Notable for the following:    RBC 3.52 (*)    Hemoglobin 7.9 (*)    HCT 26.5 (*)    MCV 75.3 (*)    MCH 22.4 (*)    MCHC 29.8 (*)    RDW 17.4 (*)    All other components within normal limits  COMPREHENSIVE METABOLIC PANEL - Abnormal; Notable for the following:    Potassium 3.6 (*)    Glucose, Bld 110 (*)    Albumin 3.1 (*)    AST 39 (*)    GFR calc non Af Amer 83 (*)    All other components within normal limits  LIPASE,  BLOOD - Abnormal; Notable for the following:    Lipase 79 (*)    All other components within normal limits  RETICULOCYTES - Abnormal; Notable for the following:    RBC. 3.68 (*)    All other components within normal limits  URINALYSIS, ROUTINE W REFLEX MICROSCOPIC  T-HELPER CELLS (CD4) COUNT  VITAMIN B12  FOLATE  IRON AND TIBC  FERRITIN  I-STAT TROPOININ, ED  POC OCCULT BLOOD, ED  I-STAT CG4 LACTIC ACID, ED  TYPE AND SCREEN    Imaging Review Dg Chest Portable 1 View  08/07/2014   CLINICAL DATA:  68 year old female with abdominal and chest pain.  EXAM: PORTABLE CHEST - 1 VIEW  COMPARISON:  03/08/2014 and prior chest radiographs  FINDINGS: Cardiomegaly again noted.  There is no evidence of focal airspace disease, pulmonary edema, suspicious pulmonary nodule/mass, pleural effusion, or pneumothorax. No acute bony abnormalities are identified.  IMPRESSION: Cardiomegaly without evidence of active cardiopulmonary disease.   Electronically Signed   By: Hassan Rowan M.D.   On: 08/07/2014 14:49     EKG Interpretation   Date/Time:  Sunday August 07 2014 14:12:48 EDT Ventricular Rate:  55 PR Interval:  180 QRS Duration: 98 QT Interval:  454 QTC Calculation: 434 R Axis:   -42 Text Interpretation:  Sinus bradycardia  Left axis deviation Incomplete  right bundle branch block Septal infarct , age undetermined Abnormal ECG  Confirmed by ZAVITZ  MD, JOSHUA ([id number]) on 08/07/2014 2:51:32 PM      MDM   Final diagnoses:  Chest pain, unspecified chest pain type  Anemia due to other cause    Filed Vitals:   08/07/14 1530  BP: 186/75  Pulse: 50  Temp:   Resp: 18   Afebrile, NAD, non-toxic appearing, AAOx4.   Concern for cardiac etiology of Chest Pain. Internal Medicine has been consulted and will see patient in the ED for likely admit. Pt does not meet criteria for CP protocol and a further evaluation is recommended. Pt has been re-evaluated prior to consult and VSS, NAD, heart RRR, pain 0/10, lungs CTAB. No acute abnormalities found on EKG and first round of cardiac enzymes negative. Patient noted to be anemic. Hemoccult negative. No hematemesis or hemoptysis or coffee-ground emesis. History of iron deficiency anemia, anemia panel sent. Mild elevation in lipase noted, patient denies any extensive alcohol use, states she had less than 8 ounces of alcohol within the last day. Will admit patient for further management and treatment. This case was discussed with Dr. Reather Converse who has seen the patient and agrees with plan to admit.      Harlow Mares, PA-C 08/07/14 1637

## 2014-08-07 NOTE — H&P (Signed)
Date: 08/07/2014               Patient Name:  Nicole Mccormick MRN: [id number]  DOB: 1946/09/13 Age / Sex: 68 y.o., female   PCP: Drucilla Schmidt, MD         Medical Service: Internal Medicine Teaching Service         Attending Physician: Dr. Madilyn Fireman, MD    First Contact: Dr. Genene Churn Pager: [phone]  Second Contact: Dr. Hayes Ludwig Pager: [phone]       After Hours (After 5p/  First Contact Pager: [phone]  weekends / holidays): Second Contact Pager: [phone]   Chief Complaint: chest pain  History of Present Illness:   68 yo female with PMH of HTN, CAD, GERD, CHF, iron deficiency anemia presents for acute onset of epigastric pain with radiation into the chest. Pain started last night with burning type of pain in the abdomen after drinking some margarita around 8 pm last night. This morning it has been going up to her chest in the middle of to chest. It's a burning type of pain without radiation anywhere else except epigastric region and mid sternal area. Having some nausea and acidic fluid in her throat that burns. Denies fever/chills/headache/ nausea/vomitting. Denies any usual food, bowel changes. Received full ASA and NGlycerine in the ED. SOB has improved but still continues to have epigastric burning pain and CP.   Patient takes carafate for her acid, she ran out recently. She has chronic back/leg pain and takes neurontin.  Home meds: tylenol-codeine, acyclovir for flares, amlodopine 10mg  qdaily, aspirin 81mg , reyataz, baclofen, antacid PO, gabapentin 600mg  TID, lisinopril-hctz 20-12.5mg  2 tabs daily, carafate 1g tablet.  Doesn't take any PPIs.  Meds: Current Facility-Administered Medications  Medication Dose Route Frequency Provider Last Rate Last Dose  . 0.9 %  sodium chloride infusion  250 mL Intravenous PRN [name], MD      . acetaminophen-codeine (TYLENOL #3) 300-30 MG per tablet 1 tablet  1 tablet Oral Q8H PRN [name], MD      . acyclovir (ZOVIRAX) tablet  400 mg  400 mg Oral Daily PRN [name], MD      . Derrill Memo ON 08/08/2014] amLODipine (NORVASC) tablet 10 mg  10 mg Oral Daily [name], MD      . Derrill Memo ON 08/08/2014] aspirin EC tablet 81 mg  81 mg Oral Daily [name], MD      . Derrill Memo ON 08/08/2014] atazanavir (REYATAZ) capsule 400 mg  400 mg Oral Q breakfast [name], MD      . baclofen (LIORESAL) tablet 20 mg  20 mg Oral TID [name], MD      . docusate sodium (COLACE) capsule 100 mg  100 mg Oral BID [name], MD      . Derrill Memo ON 08/08/2014] emtricitabine-tenofovir (TRUVADA) 200-300 MG per tablet 1 tablet  1 tablet Oral Daily [name], MD      . Derrill Memo ON 08/08/2014] ferrous sulfate tablet 325 mg  325 mg Oral TID WC [name], MD      . gabapentin (NEURONTIN) tablet 600 mg  600 mg Oral TID [name], MD      . gi cocktail (Maalox,Lidocaine,Donnatal)  30 mL Oral BID PRN [name], MD      . heparin injection 5,000 Units  5,000 Units Subcutaneous 3 times per day [name], MD      . Derrill Memo ON  08/08/2014] hydrochlorothiazide (HYDRODIURIL) tablet 25 mg  25 mg Oral Daily Madilyn Fireman, MD      . Derrill Memo ON 08/08/2014] lisinopril (PRINIVIL,ZESTRIL) tablet 40 mg  40 mg Oral Daily Madilyn Fireman, MD      . promethazine (PHENERGAN) tablet 12.5 mg  12.5 mg Oral Q6H PRN [name], MD      . sodium chloride 0.9 % injection 3 mL  3 mL Intravenous Q12H [name], MD      . sodium chloride 0.9 % injection 3 mL  3 mL Intravenous Q12H [name], MD      . sodium chloride 0.9 % injection 3 mL  3 mL Intravenous PRN [name], MD      . sucralfate (CARAFATE) tablet 1 g  1 g Oral QID [name], MD        Allergies: Allergies as of 08/07/2014 - Review Complete 08/07/2014  Allergen Reaction Noted  . Penicillins  01/26/2007  . Avelox [moxifloxacin hcl in nacl] Itching and Rash 10/13/2011   Past Medical  History  Diagnosis Date  . Hypertension   . Abnormal Pap smear   . Asthma   . Coronary artery disease   . HIV infection   . Varicosities   . Seizures     last sz fri nov 2  . GERD (gastroesophageal reflux disease)     previously on aciphex, discontinued november 2012  because patient asymptomatic, and concern about interference with HIV meds.  May try pepcid in the future if symptoms return  . Iron deficiency anemia     Ferritin = 2 in november 2012, started on iron supplemenation  . Arthritis   . CHF (congestive heart failure)   . Heart murmur    Past Surgical History  Procedure Laterality Date  . Abdominal hysterectomy    . Esophagogastroduodenoscopy  10/13/11    small hiatal hernia  . Colonoscopy  10/13/11    small adenoma, anal condyloma  . Colonoscopy  10/13/2011    Procedure: COLONOSCOPY;  Surgeon: Gatha Mayer, MD;  Location: [facility];  Service: Endoscopy;  Laterality: N/A;  . Esophagogastroduodenoscopy  10/13/2011    Procedure: ESOPHAGOGASTRODUODENOSCOPY (EGD);  Surgeon: Gatha Mayer, MD;  Location: [facility] ENDOSCOPY;  Service: Endoscopy;  Laterality: N/A;   Family History  Problem Relation Age of Onset  . Diabetes Mother   . Ovarian cancer Sister   . Cancer Sister     ovarian and colon  . Colon cancer Neg Hx    History   Social History  . Marital Status: Single    Spouse Name: N/A    Number of Children: N/A  . Years of Education: N/A   Occupational History  . Not on file.   Social History Main Topics  . Smoking status: Current Every Day Smoker -- 0.50 packs/day for 50 years    Types: Cigarettes  . Smokeless tobacco: Never Used     Comment: working on it.  . Alcohol Use: 1.2 oz/week    2 Glasses of wine per week     Comment:  sometimes a glass of wine.  . Drug Use: No  . Sexual Activity: Not on file     Comment: pt. given condoms   Other Topics Concern  . Not on file   Social History Narrative  . No narrative on file    Review of  Systems: Review of Systems  Constitutional: Negative for fever, chills and malaise/fatigue.  HENT: Negative for congestion,  ear pain, hearing loss, sore throat and tinnitus.   Eyes: Negative.   Respiratory: Negative.  Negative for cough, hemoptysis, sputum production, shortness of breath and wheezing.   Cardiovascular: Positive for chest pain. Negative for palpitations, orthopnea, claudication, leg swelling and PND.  Gastrointestinal: Positive for heartburn, nausea and abdominal pain. Negative for vomiting, diarrhea, constipation, blood in stool and melena.  Genitourinary: Negative.   Musculoskeletal: Negative.   Skin: Negative.   Neurological: Negative.  Negative for weakness and headaches.  Endo/Heme/Allergies: Negative.   Psychiatric/Behavioral: Negative.      Physical Exam: Blood pressure 162/114, pulse 52, temperature 99.3 F (37.4 C), temperature source Oral, resp. rate 21, SpO2 100.00%. Physical Exam  Constitutional: She is oriented to person, place, and time. She appears well-developed and well-nourished. No distress.  HENT:  Head: Normocephalic and atraumatic.  Right Ear: External ear normal.  Left Ear: External ear normal.  Nose: Nose normal.  Mouth/Throat: Oropharynx is clear and moist.  Eyes: Conjunctivae and EOM are normal. Pupils are equal, round, and reactive to light. Right eye exhibits no discharge. Left eye exhibits no discharge. No scleral icterus.  Neck: Normal range of motion. Neck supple. No JVD present.  Cardiovascular: Normal rate, regular rhythm, normal heart sounds and intact distal pulses.  Exam reveals no gallop and no friction rub.   No murmur heard. Respiratory: Effort normal and breath sounds normal. No respiratory distress. She has no wheezes. She has no rales. She exhibits no tenderness.  GI: Soft. Bowel sounds are normal. She exhibits no distension and no mass. There is no tenderness. There is no rebound, no guarding and no CVA tenderness.   Musculoskeletal: Normal range of motion. She exhibits no edema and no tenderness.  Neurological: She is alert and oriented to person, place, and time. She has normal reflexes.  Skin: Skin is warm. She is not diaphoretic.  Psychiatric: She has a normal mood and affect.     Lab results: Basic Metabolic Panel:  Recent Labs  08/07/14 1413  NA 137  K 3.6*  CL 101  CO2 26  GLUCOSE 110*  BUN 14  CREATININE 0.76  CALCIUM 8.6   Liver Function Tests:  Recent Labs  08/07/14 1413  AST 39*  ALT 30  ALKPHOS 67  BILITOT 0.6  PROT 7.3  ALBUMIN 3.1*    Recent Labs  08/07/14 1413  LIPASE 79*   No results found for this basename: AMMONIA,  in the last 72 hours CBC:  Recent Labs  08/07/14 1413  WBC 4.8  NEUTROABS 2.3  HGB 7.9*  HCT 26.5*  MCV 75.3*  PLT 298   Cardiac Enzymes: No results found for this basename: CKTOTAL, CKMB, CKMBINDEX, TROPONINI,  in the last 72 hours BNP: No results found for this basename: PROBNP,  in the last 72 hours D-Dimer: No results found for this basename: DDIMER,  in the last 72 hours CBG: No results found for this basename: GLUCAP,  in the last 72 hours Hemoglobin A1C: No results found for this basename: HGBA1C,  in the last 72 hours Fasting Lipid Panel: No results found for this basename: CHOL, HDL, LDLCALC, TRIG, CHOLHDL, LDLDIRECT,  in the last 72 hours Thyroid Function Tests: No results found for this basename: TSH, T4TOTAL, FREET4, T3FREE, THYROIDAB,  in the last 72 hours Anemia Panel:  Recent Labs  08/07/14 1515  RETICCTPCT 1.3   Coagulation: No results found for this basename: LABPROT, INR,  in the last 72 hours Urine Drug Screen: Drugs of Abuse  Component Value Date/Time   LABOPIA POSITIVE* 03/09/2014 1600   LABOPIA NEGATIVE 12/26/2010 0137   COCAINSCRNUR NONE DETECTED 03/09/2014 1600   COCAINSCRNUR NEGATIVE 12/26/2010 0137   LABBENZ NONE DETECTED 03/09/2014 1600   LABBENZ NEGATIVE 12/26/2010 0137   AMPHETMU NONE  DETECTED 03/09/2014 1600   AMPHETMU NEGATIVE 12/26/2010 0137   THCU NONE DETECTED 03/09/2014 1600   LABBARB NONE DETECTED 03/09/2014 1600    Alcohol Level: No results found for this basename: ETH,  in the last 72 hours Urinalysis:  Recent Labs  08/07/14 1620  COLORURINE YELLOW  LABSPEC 1.016  PHURINE 7.0  GLUCOSEU NEGATIVE  HGBUR NEGATIVE  BILIRUBINUR NEGATIVE  KETONESUR NEGATIVE  PROTEINUR NEGATIVE  UROBILINOGEN 1.0  NITRITE NEGATIVE  LEUKOCYTESUR NEGATIVE   Misc. Labs:   Imaging results:  Dg Chest Portable 1 View  08/07/2014   CLINICAL DATA:  68 year old female with abdominal and chest pain.  EXAM: PORTABLE CHEST - 1 VIEW  COMPARISON:  03/08/2014 and prior chest radiographs  FINDINGS: Cardiomegaly again noted.  There is no evidence of focal airspace disease, pulmonary edema, suspicious pulmonary nodule/mass, pleural effusion, or pneumothorax. No acute bony abnormalities are identified.  IMPRESSION: Cardiomegaly without evidence of active cardiopulmonary disease.   Electronically Signed   By: Hassan Rowan M.D.   On: 08/07/2014 14:49    Other results: EKG: NSR, LAD, LVH.  Assessment & Plan by Problem: Principal Problem:   Chest pain Active Problems:   HIV DISEASE   TOBACCO ABUSE   Essential hypertension, benign   CAD (coronary artery disease)   Iron deficiency anemia  68 yo female with hx of HTN, GERD, HIV comes in with burning epigastric pain and with some radiation to her chest after drinking margarita last night.  Chest pain - with epigastric burning pain induced by margarita. Other ddx include MI (less likely as trops negative so far and no changes on EKG). Also pain is atypical for MI. - trend drops, repeat EKG. - will start protonix daily, continue carafate which was taking at home - phenergan PRN,  - continue asa. Not sure if she has CAD, no notes proving any CAD. LDL has been in 60's.   HTN- runs high in 150's/90's at home.  - continue home meds here: amlodopine  10mg  daily, lisinopril  HIV - last CD 4 570 on 03/01/14. Followed by Dr. Johnnye Sima - cont emtricitabine-tenofovir. cpmt reyataz  HCV - positive on 2009. Don't see any note showing she was treated or if it was addressed.  - needs to be followed outpatient. Patient denies knowing about it.  Back pain/leg pain - cont gabapentin  Anemia- hx of iron deficiency anemia. Noncompliant with iron supplements - hgb is at her baseline prior to starting iron supplements. - encouraged iron intake and agrees to restart. Will start iron here and continue at home.  Dispo: Disposition is deferred at this time, awaiting improvement of current medical problems. Anticipated discharge in approximately 2-3 day(s).   The patient does have a current PCP Drucilla Schmidt, MD) and does need an [facility] hospital follow-up appointment after discharge.  The patient does not know have transportation limitations that hinder transportation to clinic appointments.  Signed: Dellia Nims, MD 08/07/2014, 6:01 PM

## 2014-08-07 NOTE — ED Provider Notes (Signed)
Medical screening examination/treatment/procedure(s) were conducted as a shared visit with non-physician practitioner(s) or resident and myself. I personally evaluated the patient during the encounter and agree with the findings.  I have personally reviewed any xrays and/ or EKG's with the provider and I agree with interpretation.  Patient with history of CAD, ulcer, HIV, asthma, CHF, hypertension presents with epigastric pain is moved to the lower chest as well. Patient feels she's had milder versions in the past. Patient denies lung the stools or radiation to the back. Mild nausea. On exam patient has mild epigastric tenderness no guarding, well-appearing, brown stool, bradycardic regular rhythm. Blood pressure elevated in ER. With new anemia concern for ulcer as cause of bleeding. Plan for type and screen and further admission for chest pain and epigastric pain. Pain controlled in ER.  Labs Reviewed  CBC WITH DIFFERENTIAL - Abnormal; Notable for the following:    RBC 3.52 (*)    Hemoglobin 7.9 (*)    HCT 26.5 (*)    MCV 75.3 (*)    MCH 22.4 (*)    MCHC 29.8 (*)    RDW 17.4 (*)    All other components within normal limits  COMPREHENSIVE METABOLIC PANEL - Abnormal; Notable for the following:    Potassium 3.6 (*)    Glucose, Bld 110 (*)    Albumin 3.1 (*)    AST 39 (*)    GFR calc non Af Amer 83 (*)    All other components within normal limits  LIPASE, BLOOD - Abnormal; Notable for the following:    Lipase 79 (*)    All other components within normal limits  RETICULOCYTES - Abnormal; Notable for the following:    RBC. 3.68 (*)    All other components within normal limits  URINALYSIS, ROUTINE W REFLEX MICROSCOPIC  T-HELPER CELLS (CD4) COUNT  VITAMIN B12  FOLATE  IRON AND TIBC  FERRITIN  BASIC METABOLIC PANEL  CBC  TROPONIN I  TROPONIN I  I-STAT TROPOININ, ED  POC OCCULT BLOOD, ED  I-STAT CG4 LACTIC ACID, ED  TYPE AND SCREEN    Chest pain, epigastric pain, anemia   Mariea Clonts, MD 08/07/14 1739

## 2014-08-07 NOTE — ED Notes (Signed)
Attempted report 

## 2014-08-07 NOTE — ED Notes (Signed)
I Stat Lactic Acid results shown to J. Piepenbrink PA

## 2014-08-07 NOTE — ED Notes (Signed)
Pt reports epigastric pain x 2 days, radiates into her chest and feeling nauseated. Pt thought possible acid reflux but no relief with antacids.

## 2014-08-08 DIAGNOSIS — D509 Iron deficiency anemia, unspecified: Secondary | ICD-10-CM

## 2014-08-08 DIAGNOSIS — B20 Human immunodeficiency virus [HIV] disease: Secondary | ICD-10-CM

## 2014-08-08 DIAGNOSIS — R079 Chest pain, unspecified: Secondary | ICD-10-CM

## 2014-08-08 DIAGNOSIS — F17200 Nicotine dependence, unspecified, uncomplicated: Secondary | ICD-10-CM

## 2014-08-08 DIAGNOSIS — M79609 Pain in unspecified limb: Secondary | ICD-10-CM

## 2014-08-08 DIAGNOSIS — I10 Essential (primary) hypertension: Secondary | ICD-10-CM | POA: Diagnosis not present

## 2014-08-08 DIAGNOSIS — Z21 Asymptomatic human immunodeficiency virus [HIV] infection status: Secondary | ICD-10-CM

## 2014-08-08 DIAGNOSIS — I2510 Atherosclerotic heart disease of native coronary artery without angina pectoris: Secondary | ICD-10-CM

## 2014-08-08 DIAGNOSIS — I209 Angina pectoris, unspecified: Secondary | ICD-10-CM

## 2014-08-08 DIAGNOSIS — R109 Unspecified abdominal pain: Secondary | ICD-10-CM | POA: Diagnosis not present

## 2014-08-08 DIAGNOSIS — R112 Nausea with vomiting, unspecified: Secondary | ICD-10-CM | POA: Diagnosis not present

## 2014-08-08 DIAGNOSIS — D6489 Other specified anemias: Secondary | ICD-10-CM

## 2014-08-08 DIAGNOSIS — D649 Anemia, unspecified: Secondary | ICD-10-CM

## 2014-08-08 DIAGNOSIS — I509 Heart failure, unspecified: Secondary | ICD-10-CM

## 2014-08-08 DIAGNOSIS — M549 Dorsalgia, unspecified: Secondary | ICD-10-CM

## 2014-08-08 LAB — CBC
HCT: 24 % — ABNORMAL LOW (ref 36.0–46.0)
HCT: 26.4 % — ABNORMAL LOW (ref 36.0–46.0)
Hemoglobin: 7.2 g/dL — ABNORMAL LOW (ref 12.0–15.0)
Hemoglobin: 7.8 g/dL — ABNORMAL LOW (ref 12.0–15.0)
MCH: 22.9 pg — ABNORMAL LOW (ref 26.0–34.0)
MCH: 22 pg — ABNORMAL LOW (ref 26.0–34.0)
MCHC: 30 g/dL (ref 30.0–36.0)
MCHC: 29.5 g/dL — ABNORMAL LOW (ref 30.0–36.0)
MCV: 76.2 fL — ABNORMAL LOW (ref 78.0–100.0)
MCV: 74.4 fL — ABNORMAL LOW (ref 78.0–100.0)
Platelets: 251 10*3/uL (ref 150–400)
Platelets: 289 10*3/uL (ref 150–400)
RBC: 3.15 MIL/uL — ABNORMAL LOW (ref 3.87–5.11)
RBC: 3.55 MIL/uL — ABNORMAL LOW (ref 3.87–5.11)
RDW: 17.7 % — ABNORMAL HIGH (ref 11.5–15.5)
RDW: 17.5 % — ABNORMAL HIGH (ref 11.5–15.5)
WBC: 3.9 10*3/uL — ABNORMAL LOW (ref 4.0–10.5)
WBC: 5.4 10*3/uL (ref 4.0–10.5)

## 2014-08-08 LAB — HEPATITIS A ANTIBODY, TOTAL: Hep A Total Ab: REACTIVE — AB

## 2014-08-08 LAB — BASIC METABOLIC PANEL
Anion gap: 8 (ref 5–15)
BUN: 11 mg/dL (ref 6–23)
CO2: 27 mEq/L (ref 19–32)
Calcium: 8.1 mg/dL — ABNORMAL LOW (ref 8.4–10.5)
Chloride: 103 mEq/L (ref 96–112)
Creatinine, Ser: 0.86 mg/dL (ref 0.50–1.10)
GFR calc Af Amer: 78 mL/min — ABNORMAL LOW (ref >90)
GFR calc non Af Amer: 67 mL/min — ABNORMAL LOW (ref >90)
Glucose, Bld: 123 mg/dL — ABNORMAL HIGH (ref 70–99)
Potassium: 3.7 mEq/L (ref 3.7–5.3)
Sodium: 138 mEq/L (ref 137–147)

## 2014-08-08 LAB — TROPONIN I: Troponin I: <0.3 ng/mL (ref <0.30)

## 2014-08-08 LAB — HEPATITIS B SURFACE ANTIBODY,QUALITATIVE: Hep B S Ab: NEGATIVE

## 2014-08-08 LAB — HEPATITIS B CORE ANTIBODY, TOTAL: Hep B Core Total Ab: NONREACTIVE

## 2014-08-08 LAB — HEPATITIS B SURFACE ANTIGEN: Hepatitis B Surface Ag: NEGATIVE

## 2014-08-08 LAB — T-HELPER CELLS (CD4) COUNT (NOT AT ARMC)
CD4 % Helper T Cell: 28 % — ABNORMAL LOW (ref 33–55)
CD4 T Cell Abs: 510 /uL (ref 400–2700)

## 2014-08-08 LAB — PROTIME-INR
INR: 0.98 (ref 0.00–1.49)
Prothrombin Time: 13 seconds (ref 11.6–15.2)

## 2014-08-08 MED ORDER — ENSURE NUTRA SHAKE HI-CAL PO LIQD: 1.0000 | Freq: Two times a day (BID) | ORAL | Status: DC

## 2014-08-08 MED ORDER — ISOSORBIDE MONONITRATE ER 30 MG PO TB24
30.0000 mg | ORAL_TABLET | Freq: Every day | ORAL | Status: DC
  Administered 2014-08-08: 30 mg via ORAL
  Filled 2014-08-08: qty 1

## 2014-08-08 MED ORDER — FERROUS SULFATE 325 (65 FE) MG PO TABS: 325.0000 mg | ORAL_TABLET | Freq: Three times a day (TID) | ORAL | Status: DC

## 2014-08-08 MED ORDER — ISOSORBIDE MONONITRATE ER 30 MG PO TB24: 30.0000 mg | ORAL_TABLET | Freq: Every day | ORAL | Status: DC

## 2014-08-08 MED ORDER — SUCRALFATE 1 G PO TABS: 1.0000 g | ORAL_TABLET | Freq: Four times a day (QID) | ORAL | Status: DC

## 2014-08-08 MED ORDER — DSS 100 MG PO CAPS: 100.0000 mg | ORAL_CAPSULE | Freq: Two times a day (BID) | ORAL | Status: DC

## 2014-08-08 MED ORDER — FAMOTIDINE 40 MG PO TABS: 40.0000 mg | ORAL_TABLET | Freq: Every day | ORAL | Status: DC

## 2014-08-08 NOTE — Discharge Summary (Signed)
Name: Nicole Mccormick MRN: [id number] DOB: [birth year] 68 y.o. PCP: Drucilla Schmidt, MD  Date of Admission: 08/07/2014  2:14 PM Date of Discharge: 08/08/2014 Attending Physician: Madilyn Fireman, MD  Discharge Diagnosis: Principal Problem:   Chest pain Active Problems:   HIV DISEASE   TOBACCO ABUSE   Essential hypertension, benign   Iron deficiency anemia   Hepatitis C  Discharge Medications:   Medication List         acetaminophen-codeine 300-30 MG per tablet  Commonly known as:  TYLENOL #3  Take 1 tablet by mouth every 8 (eight) hours as needed for moderate pain or severe pain.     acyclovir 400 MG tablet  Commonly known as:  ZOVIRAX  Take 400 mg by mouth daily as needed (for flare ups).     amLODipine 10 MG tablet  Commonly known as:  NORVASC  Take 1 tablet (10 mg total) by mouth daily.     ANTACID PO  Take 1 tablet by mouth 2 (two) times daily as needed (acid reflux).     aspirin EC 81 MG tablet  Take 1 tablet (81 mg total) by mouth daily.     atazanavir 200 MG capsule  Commonly known as:  REYATAZ  Take 400 mg by mouth daily with breakfast. **SPACED 12 HOURS FROM ANTACIDS**     baclofen 20 MG tablet  Commonly known as:  LIORESAL  Take 20 mg by mouth 3 (three) times daily.     DSS 100 MG Caps  Take 100 mg by mouth 2 (two) times daily.     emtricitabine-tenofovir 200-300 MG per tablet  Commonly known as:  TRUVADA  Take 1 tablet by mouth daily.     ENSURE NUTRA SHAKE HI-CAL Liqd  Take 1 Can by mouth 2 (two) times daily.     ENSURE NUTRA SHAKE HI-CAL Liqd  Take 1 Can by mouth 2 (two) times daily.     famotidine 40 MG tablet  Commonly known as:  PEPCID  Take 1 tablet (40 mg total) by mouth daily.     ferrous sulfate 325 (65 FE) MG tablet  Take 1 tablet (325 mg total) by mouth 3 (three) times daily with meals.     gabapentin 600 MG tablet  Commonly known as:  NEURONTIN  Take 1 tablet (600 mg total) by mouth 3 (three) times daily.     isosorbide  mononitrate 30 MG 24 hr tablet  Commonly known as:  IMDUR  Take 1 tablet (30 mg total) by mouth daily.     lisinopril-hydrochlorothiazide 20-12.5 MG per tablet  Commonly known as:  PRINZIDE,ZESTORETIC  Take 2 tablets by mouth daily.     sucralfate 1 G tablet  Commonly known as:  CARAFATE  Take 1 tablet (1 g total) by mouth 4 (four) times daily.     sucralfate 1 G tablet  Commonly known as:  CARAFATE  Take 1 tablet (1 g total) by mouth 4 (four) times daily.        Disposition and follow-up:   [name] was discharged from [facility] in Good condition.  At the hospital follow up visit please address:  1.  She needs to follow up with ID Dr. Johnnye Sima about Hep C. It was seen on 2009 but patient is not sure if it was ever treated or addressed. Just please clarify whether it was addressed. She also needs her CBC monitored at some point in the future as we restarted iron supplements on her.  2.  Labs / imaging needed at time of follow-up: Needs Abdominal ultrasound complete with elastrography for Hepatitis workup.  Also needs ECHO repeated per cardiology rec for loud MR murmur.   3.  Pending labs/ test needing follow-up: Hepatitis panel pending, HIV, ANA.  Follow-up Appointments:     Follow-up Information   Schedule an appointment as soon as possible for a visit with [name], MD.   Specialty:  Infectious Diseases   Contact information:   [place] [id number] [phone]       Follow up with Dorothy Spark, MD On 08/12/2014. (you have appointment with Dr. Meda Coffee 08/12/2014 at 11:45 AM. )    Specialty:  Cardiology   Contact information:   [place] [address] [phone]       Discharge Instructions:   Consultations: Treatment Team:  Rounding Lbcardiology, MD  Procedures Performed:  Dg Chest Portable 1 View  08/07/2014   CLINICAL DATA:  68 year old female with abdominal and chest  pain.  EXAM: PORTABLE CHEST - 1 VIEW  COMPARISON:  03/08/2014 and prior chest radiographs  FINDINGS: Cardiomegaly again noted.  There is no evidence of focal airspace disease, pulmonary edema, suspicious pulmonary nodule/mass, pleural effusion, or pneumothorax. No acute bony abnormalities are identified.  IMPRESSION: Cardiomegaly without evidence of active cardiopulmonary disease.   Electronically Signed   By: Hassan Rowan M.D.   On: 08/07/2014 14:49    2D Echo:   Cardiac Cath:   Admission HPI:   68 yo female with PMH of HTN, CAD, GERD, CHF, iron deficiency anemia presents for acute onset of epigastric pain with radiation into the chest. Pain started last night with burning type of pain in the abdomen after drinking some margarita around 8 pm last night. This morning it has been going up to her chest in the middle of to chest. It's a burning type of pain without radiation anywhere else except epigastric region and mid sternal area. Having some nausea and acidic fluid in her throat that burns. Denies fever/chills/headache/ nausea/vomitting. Denies any usual food, bowel changes. Received full ASA and NGlycerine in the ED. SOB has improved but still continues to have epigastric burning pain and CP.  Patient takes carafate for her acid, she ran out recently. She has chronic back/leg pain and takes neurontin.  Home meds: tylenol-codeine, acyclovir for flares, amlodopine 10mg  qdaily, aspirin 81mg , reyataz, baclofen, antacid PO, gabapentin 600mg  TID, lisinopril-hctz 20-12.5mg  2 tabs daily, carafate 1g tablet. Doesn't take any PPIs.   Hospital Course by problem list:   68 yo female with hx of HTN, GERD, HIV comes in with burning epigastric pain and with some radiation to her chest after drinking margarita. Chest pain - with epigastric burning pain induced by margarita. Other ddx include MI (less likely as trops negative so far and no changes on EKG). Also pain is atypical for MI. trops negative, EKG shows no  ishcemic changes but shows LVH and LAD. - start pepcid. (protinix will interfere with HIV meds) continue carafate which was taking at home  - continue asa. Not sure if she has CAD, no notes proving any CAD. LDL has been in 60's.  HTN- runs high in 150's/90's at home.  - continue home meds here: amlodopine 10mg  daily, lisinopril. - cardiology recs appreciated. Added imdur 30mg  po Daily.  Chronic CHF with pEF - impaired relaxation with elevated filling pressures on April 2015. ECHO shows EF 65-70%, LVH. She is euvolemic. -  has loud holosystolic murmur, likely MR murmur, last echo only showed mild MR. Will need to repeat ECHO outpatient. HIV - last CD 4 570 on 03/01/14. Followed by Dr. Johnnye Sima  - cont emtricitabine-tenofovir. cpmt reyataz  HCV  - positive on 2009. Don't see any note showing she was treated or if it was addressed.  - needs to be followed outpatient.  - hep C panel ordered here. Will need Ultrasound full abdomen with elastography.  Anemia- hx of iron deficiency anemia. Noncompliant with iron supplements  - hgb is at her baseline prior to starting iron supplements.  - encouraged iron intake and agrees to restart. Will start iron here and continue at home.- bowel regimen to avoid constipation with iron. Back pain/leg pain  - cont gabapentin  Discharge Vitals:   BP 178/56  Pulse 55  Temp(Src) 99.4 F (37.4 C) (Oral)  Resp 16  Ht 5\' 2"  (1.575 m)  Wt 59.92 kg (132 lb 1.6 oz)  BMI 24.16 kg/m2  SpO2 100%  Discharge Labs:  Results for orders placed during the hospital encounter of 08/07/14 (from the past 24 hour(s))  CBC WITH DIFFERENTIAL     Status: Abnormal   Collection Time    08/07/14  2:13 PM      Result Value Ref Range   WBC 4.8  4.0 - 10.5 K/uL   RBC 3.52 (*) 3.87 - 5.11 MIL/uL   Hemoglobin 7.9 (*) 12.0 - 15.0 g/dL   HCT 26.5 (*) 36.0 - 46.0 %   MCV 75.3 (*) 78.0 - 100.0 fL   MCH 22.4 (*) 26.0 - 34.0 pg   MCHC 29.8 (*) 30.0 - 36.0 g/dL   RDW 17.4 (*) 11.5 - 15.5  %   Platelets 298  150 - 400 K/uL   Neutrophils Relative % 48  43 - 77 %   Neutro Abs 2.3  1.7 - 7.7 K/uL   Lymphocytes Relative 40  12 - 46 %   Lymphs Abs 1.9  0.7 - 4.0 K/uL   Monocytes Relative 10  3 - 12 %   Monocytes Absolute 0.5  0.1 - 1.0 K/uL   Eosinophils Relative 3  0 - 5 %   Eosinophils Absolute 0.1  0.0 - 0.7 K/uL   Basophils Relative 1  0 - 1 %   Basophils Absolute 0.0  0.0 - 0.1 K/uL  COMPREHENSIVE METABOLIC PANEL     Status: Abnormal   Collection Time    08/07/14  2:13 PM      Result Value Ref Range   Sodium 137  137 - 147 mEq/L   Potassium 3.6 (*) 3.7 - 5.3 mEq/L   Chloride 101  96 - 112 mEq/L   CO2 26  19 - 32 mEq/L   Glucose, Bld 110 (*) 70 - 99 mg/dL   BUN 14  6 - 23 mg/dL   Creatinine, Ser 0.76  0.50 - 1.10 mg/dL   Calcium 8.6  8.4 - 10.5 mg/dL   Total Protein 7.3  6.0 - 8.3 g/dL   Albumin 3.1 (*) 3.5 - 5.2 g/dL   AST 39 (*) 0 - 37 U/L   ALT 30  0 - 35 U/L   Alkaline Phosphatase 67  39 - 117 U/L   Total Bilirubin 0.6  0.3 - 1.2 mg/dL   GFR calc non Af Amer 83 (*) >90 mL/min   GFR calc Af Amer >90  >90 mL/min   Anion gap 10  5 - 15  LIPASE, BLOOD  Status: Abnormal   Collection Time    08/07/14  2:13 PM      Result Value Ref Range   Lipase 79 (*) 11 - 59 U/L  I-STAT TROPOININ, ED     Status: None   Collection Time    08/07/14  2:29 PM      Result Value Ref Range   Troponin i, poc 0.00  0.00 - 0.08 ng/mL   Comment 3           POC OCCULT BLOOD, ED     Status: None   Collection Time    08/07/14  2:59 PM      Result Value Ref Range   Fecal Occult Bld NEGATIVE  NEGATIVE  T-HELPER CELLS (CD4) COUNT     Status: Abnormal   Collection Time    08/07/14  3:15 PM      Result Value Ref Range   CD4 T Cell Abs 510  400 - 2700 /uL   CD4 % Helper T Cell 28 (*) 33 - 55 %  VITAMIN B12     Status: None   Collection Time    08/07/14  3:15 PM      Result Value Ref Range   Vitamin B-12 508  211 - 911 pg/mL  FOLATE     Status: None   Collection Time     08/07/14  3:15 PM      Result Value Ref Range   Folate 13.0    IRON AND TIBC     Status: Abnormal   Collection Time    08/07/14  3:15 PM      Result Value Ref Range   Iron 11 (*) 42 - 135 ug/dL   TIBC 538 (*) 250 - 470 ug/dL   Saturation Ratios 2 (*) 20 - 55 %   UIBC 527 (*) 125 - 400 ug/dL  FERRITIN     Status: Abnormal   Collection Time    08/07/14  3:15 PM      Result Value Ref Range   Ferritin 3 (*) 10 - 291 ng/mL  RETICULOCYTES     Status: Abnormal   Collection Time    08/07/14  3:15 PM      Result Value Ref Range   Retic Ct Pct 1.3  0.4 - 3.1 %   RBC. 3.68 (*) 3.87 - 5.11 MIL/uL   Retic Count, Manual 47.8  19.0 - 186.0 K/uL  TYPE AND SCREEN     Status: None   Collection Time    08/07/14  3:15 PM      Result Value Ref Range   ABO/RH(D) O POS     Antibody Screen NEG     Sample Expiration 08/10/2014    I-STAT CG4 LACTIC ACID, ED     Status: None   Collection Time    08/07/14  3:31 PM      Result Value Ref Range   Lactic Acid, Venous 0.56  0.5 - 2.2 mmol/L  URINALYSIS, ROUTINE W REFLEX MICROSCOPIC     Status: None   Collection Time    08/07/14  4:20 PM      Result Value Ref Range   Color, Urine YELLOW  YELLOW   APPearance CLEAR  CLEAR   Specific Gravity, Urine 1.016  1.005 - 1.030   pH 7.0  5.0 - 8.0   Glucose, UA NEGATIVE  NEGATIVE mg/dL   Hgb urine dipstick NEGATIVE  NEGATIVE   Bilirubin Urine NEGATIVE  NEGATIVE   Ketones,  ur NEGATIVE  NEGATIVE mg/dL   Protein, ur NEGATIVE  NEGATIVE mg/dL   Urobilinogen, UA 1.0  0.0 - 1.0 mg/dL   Nitrite NEGATIVE  NEGATIVE   Leukocytes, UA NEGATIVE  NEGATIVE  TROPONIN I     Status: None   Collection Time    08/07/14  6:34 PM      Result Value Ref Range   Troponin I <0.30  <0.30 ng/mL  TROPONIN I     Status: None   Collection Time    08/07/14 11:36 PM      Result Value Ref Range   Troponin I <0.30  <0.30 ng/mL  BASIC METABOLIC PANEL     Status: Abnormal   Collection Time    08/08/14  4:55 AM      Result Value Ref  Range   Sodium 138  137 - 147 mEq/L   Potassium 3.7  3.7 - 5.3 mEq/L   Chloride 103  96 - 112 mEq/L   CO2 27  19 - 32 mEq/L   Glucose, Bld 123 (*) 70 - 99 mg/dL   BUN 11  6 - 23 mg/dL   Creatinine, Ser 0.86  0.50 - 1.10 mg/dL   Calcium 8.1 (*) 8.4 - 10.5 mg/dL   GFR calc non Af Amer 67 (*) >90 mL/min   GFR calc Af Amer 78 (*) >90 mL/min   Anion gap 8  5 - 15  CBC     Status: Abnormal   Collection Time    08/08/14  4:55 AM      Result Value Ref Range   WBC 3.9 (*) 4.0 - 10.5 K/uL   RBC 3.15 (*) 3.87 - 5.11 MIL/uL   Hemoglobin 7.2 (*) 12.0 - 15.0 g/dL   HCT 24.0 (*) 36.0 - 46.0 %   MCV 76.2 (*) 78.0 - 100.0 fL   MCH 22.9 (*) 26.0 - 34.0 pg   MCHC 30.0  30.0 - 36.0 g/dL   RDW 17.7 (*) 11.5 - 15.5 %   Platelets 251  150 - 400 K/uL  CBC     Status: Abnormal   Collection Time    08/08/14 11:25 AM      Result Value Ref Range   WBC 5.4  4.0 - 10.5 K/uL   RBC 3.55 (*) 3.87 - 5.11 MIL/uL   Hemoglobin 7.8 (*) 12.0 - 15.0 g/dL   HCT 26.4 (*) 36.0 - 46.0 %   MCV 74.4 (*) 78.0 - 100.0 fL   MCH 22.0 (*) 26.0 - 34.0 pg   MCHC 29.5 (*) 30.0 - 36.0 g/dL   RDW 17.5 (*) 11.5 - 15.5 %   Platelets 289  150 - 400 K/uL  PROTIME-INR     Status: None   Collection Time    08/08/14 11:25 AM      Result Value Ref Range   Prothrombin Time 13.0  11.6 - 15.2 seconds   INR 0.98  0.00 - 1.49    Signed: Dellia Nims, MD 08/08/2014, 12:45 PM    Services Ordered on Discharge:  Equipment Ordered on Discharge:

## 2014-08-08 NOTE — Consult Note (Signed)
CARDIOLOGY CONSULT NOTE   Patient ID: Nicole Mccormick MRN: [id number], DOB/AGE: [birth year]   Admit date: 08/07/2014 Date of Consult: 08/08/2014  Primary Physician: Drucilla Schmidt, MD Primary Cardiologist: None  Reason for consult:  Chest pain  Problem List  Past Medical History  Diagnosis Date  . Hypertension   . Abnormal Pap smear   . Asthma   . Coronary artery disease   . HIV infection   . Varicosities   . Seizures     last sz fri nov 2  . GERD (gastroesophageal reflux disease)     previously on aciphex, discontinued november 2012  because patient asymptomatic, and concern about interference with HIV meds.  May try pepcid in the future if symptoms return  . Iron deficiency anemia     Ferritin = 2 in november 2012, started on iron supplemenation  . Arthritis   . CHF (congestive heart failure)   . Heart murmur     Past Surgical History  Procedure Laterality Date  . Abdominal hysterectomy    . Esophagogastroduodenoscopy  10/13/11    small hiatal hernia  . Colonoscopy  10/13/11    small adenoma, anal condyloma  . Colonoscopy  10/13/2011    Procedure: COLONOSCOPY;  Surgeon: Gatha Mayer, MD;  Location: [facility];  Service: Endoscopy;  Laterality: N/A;  . Esophagogastroduodenoscopy  10/13/2011    Procedure: ESOPHAGOGASTRODUODENOSCOPY (EGD);  Surgeon: Gatha Mayer, MD;  Location: [facility] ENDOSCOPY;  Service: Endoscopy;  Laterality: N/A;     Allergies  Allergies  Allergen Reactions  . Penicillins     REACTION: GOES INTO SHOCK & THEN PASSES OUT  . Avelox [Moxifloxacin Hcl In Nacl] Itching and Rash    HPI   Patient is a 68 yo F PMHx significant for HTN, CAD, HIV, GERD, CHF, tobacco abuse, iron deficiency anemia presenting to the ED for acute onset epigastric pain with radiation into chest with associated nausea, non-bloody non-bilious emesis, and shortness of breath that began last evening at 8PM. Alleviating factors: none. Aggravating factors: none.   The patient states  that she had 2 heart attacks in the past (no records), but that no cath or stenting was done. She states that she had stress test, but again there are no records.  She states that she suffers from GERD and that her pain yesterday happened at rest, was located at her epigastrium and typical for her GERD. She is very comfortable right now and chest pain free. She also denies SOB, exertional dyspnea, palpitations, syncope, LE edema, orthopnea or PND.   The patient has been smoking 1 PPD for the last 50 years, no intention to stop.   Inpatient Medications  . amLODipine  10 mg Oral Daily  . aspirin EC  81 mg Oral Daily  . atazanavir  400 mg Oral Q breakfast  . baclofen  20 mg Oral TID  . docusate sodium  100 mg Oral BID  . emtricitabine-tenofovir  1 tablet Oral Daily  . ferrous sulfate  325 mg Oral TID WC  . gabapentin  600 mg Oral TID  . heparin  5,000 Units Subcutaneous 3 times per day  . hydrochlorothiazide  25 mg Oral Daily  . lisinopril  40 mg Oral Daily  . sodium chloride  3 mL Intravenous Q12H  . sodium chloride  3 mL Intravenous Q12H  . sucralfate  1 g Oral QID    Family History Family History  Problem Relation Age of Onset  . Diabetes Mother   .  Ovarian cancer Sister   . Cancer Sister     ovarian and colon  . Colon cancer Neg Hx      Social History History   Social History  . Marital Status: Single    Spouse Name: N/A    Number of Children: N/A  . Years of Education: N/A   Occupational History  . Not on file.   Social History Main Topics  . Smoking status: Current Every Day Smoker -- 0.50 packs/day for 50 years    Types: Cigarettes  . Smokeless tobacco: Never Used     Comment: working on it.  . Alcohol Use: 1.2 oz/week    2 Glasses of wine per week     Comment:  sometimes a glass of wine.  . Drug Use: No  . Sexual Activity: Not on file     Comment: pt. given condoms   Other Topics Concern  . Not on file   Social History Narrative  . No narrative on  file     Review of Systems  General:  No chills, fever, night sweats or weight changes.  Cardiovascular:  No chest pain, dyspnea on exertion, edema, orthopnea, palpitations, paroxysmal nocturnal dyspnea. Dermatological: No rash, lesions/masses Respiratory: No cough, dyspnea Urologic: No hematuria, dysuria Abdominal:   No nausea, vomiting, diarrhea, bright red blood per rectum, melena, or hematemesis Neurologic:  No visual changes, wkns, changes in mental status. All other systems reviewed and are otherwise negative except as noted above.  Physical Exam  Blood pressure 178/56, pulse 55, temperature 99.4 F (37.4 C), temperature source Oral, resp. rate 16, height 5\' 2"  (1.575 m), weight 132 lb 1.6 oz (59.92 kg), SpO2 100.00%.  General: Pleasant, NAD Psych: Normal affect. Neuro: Alert and oriented X 3. Moves all extremities spontaneously. HEENT: Normal  Neck: Supple without bruits or JVD. Lungs:  Resp regular and unlabored, CTA. Heart: RRR no s3, s4, loud 4/6 holosystolic murmur. Abdomen: Soft, non-tender, non-distended, BS + x 4.  Extremities: No clubbing, cyanosis or edema. DP/PT/Radials 2+ and equal bilaterally.  Labs  Recent Labs  08/07/14 1834 08/07/14 2336  TROPONINI <0.30 <0.30   Lab Results  Component Value Date   WBC 3.9* 08/08/2014   HGB 7.2* 08/08/2014   HCT 24.0* 08/08/2014   MCV 76.2* 08/08/2014   PLT 251 08/08/2014    Recent Labs Lab 08/07/14 1413 08/08/14 0455  NA 137 138  K 3.6* 3.7  CL 101 103  CO2 26 27  BUN 14 11  CREATININE 0.76 0.86  CALCIUM 8.6 8.1*  PROT 7.3  --   BILITOT 0.6  --   ALKPHOS 67  --   ALT 30  --   AST 39*  --   GLUCOSE 110* 123*   Lab Results  Component Value Date   CHOL 127 03/01/2014   HDL 51 03/01/2014   LDLCALC 56 03/01/2014   TRIG 102 03/01/2014   Radiology/Studies  Dg Chest Portable 1 View  08/07/2014   CLINICAL DATA:  67 year old female with abdominal and chest pain.  EXAM: PORTABLE CHEST - 1 VIEW  COMPARISON:   03/08/2014 and prior chest radiographs  FINDINGS: Cardiomegaly again noted.  There is no evidence of focal airspace disease, pulmonary edema, suspicious pulmonary nodule/mass, pleural effusion, or pneumothorax. No acute bony abnormalities are identified.  IMPRESSION: Cardiomegaly without evidence of active cardiopulmonary disease.   Electronically Signed   By: Hassan Rowan M.D.   On: 08/07/2014 14:49   Echocardiogram - 03/09/2014 - Left ventricle: The  cavity size was normal. Wall thickness was increased in a pattern of mild LVH. Systolic function was vigorous. The estimated ejection fraction was in the range of 65% to 70%. Wall motion was normal; there were no regional wall motion abnormalities. Doppler parameters are consistent with abnormal left ventricular relaxation (grade 1 diastolic dysfunction). Doppler parameters are consistent with high ventricular filling pressure. - Mitral valve: Calcified annulus. Mild regurgitation. - Left atrium: The atrium was mildly dilated. - Pulmonary arteries: Systolic pressure was mildly increased. PA peak pressure: 45mm Hg (S). Impressions:  - Mildly elevated LVOT gradient of 2.1 m/s; midcavitary gradient of approximately 2.5 m/s most likely related to vigorous LV function.  ECG: SB, LVH    ASSESSMENT AND PLAN  Patient is a 68 yo F PMHx significant for HTN, CAD, HIV, GERD, CHF, tobacco abuse, iron deficiency anemia   1. Atypical chest pain - her pain is very atypical and seems to be related to GERD. Besides her BP is significantly elevated and she has severe microcytic anemia. These should be corrected and if her symptoms persists I would recommend to perform a stress test with imaging (stress echo, nuclear stress test). ECG shows just LVH, otherwise normal, troponin negative x 3 No further ischemic workup needed at this time.   2. Hypertension - add imdur 30 mg po daily  3. Chronic CHF with pEF - impaired relaxation with elevated filling pressures  in April 2015, she appears euvolemic  4. Loud holosystolic murmur - only mild MR on the last echo, we will repeat   Signed, Dorothy Spark, MD, [facility] 08/08/2014, 9:15 AM

## 2014-08-08 NOTE — Discharge Instructions (Signed)
It was a pleasure taking care of you. You were admitted for chest pain likely from acid reflux. You should take acid reducer pepcid daily. You should see your infectious disease Dr. Johnnye Sima right away. You should also follow up with cardiologist Dr. Meda Coffee at her clinic to get an ultrasound of your heart.  Gastroesophageal Reflux Disease, Adult Gastroesophageal reflux disease (GERD) happens when acid from your stomach flows up into the esophagus. When acid comes in contact with the esophagus, the acid causes soreness (inflammation) in the esophagus. Over time, GERD may create small holes (ulcers) in the lining of the esophagus. CAUSES   Increased body weight. This puts pressure on the stomach, making acid rise from the stomach into the esophagus.  Smoking. This increases acid production in the stomach.  Drinking alcohol. This causes decreased pressure in the lower esophageal sphincter (valve or ring of muscle between the esophagus and stomach), allowing acid from the stomach into the esophagus.  Late evening meals and a full stomach. This increases pressure and acid production in the stomach.  A malformed lower esophageal sphincter. Sometimes, no cause is found. SYMPTOMS   Burning pain in the lower part of the mid-chest behind the breastbone and in the mid-stomach area. This may occur twice a week or more often.  Trouble swallowing.  Sore throat.  Dry cough.  Asthma-like symptoms including chest tightness, shortness of breath, or wheezing. DIAGNOSIS  Your caregiver may be able to diagnose GERD based on your symptoms. In some cases, X-rays and other tests may be done to check for complications or to check the condition of your stomach and esophagus. TREATMENT  Your caregiver may recommend over-the-counter or prescription medicines to help decrease acid production. Ask your caregiver before starting or adding any new medicines.  HOME CARE INSTRUCTIONS   Change the factors that you can  control. Ask your caregiver for guidance concerning weight loss, quitting smoking, and alcohol consumption.  Avoid foods and drinks that make your symptoms worse, such as:  Caffeine or alcoholic drinks.  Chocolate.  Peppermint or mint flavorings.  Garlic and onions.  Spicy foods.  Citrus fruits, such as oranges, lemons, or limes.  Tomato-based foods such as sauce, chili, salsa, and pizza.  Fried and fatty foods.  Avoid lying down for the 3 hours prior to your bedtime or prior to taking a nap.  Eat small, frequent meals instead of large meals.  Wear loose-fitting clothing. Do not wear anything tight around your waist that causes pressure on your stomach.  Raise the head of your bed 6 to 8 inches with wood blocks to help you sleep. Extra pillows will not help.  Only take over-the-counter or prescription medicines for pain, discomfort, or fever as directed by your caregiver.  Do not take aspirin, ibuprofen, or other nonsteroidal anti-inflammatory drugs (NSAIDs). SEEK IMMEDIATE MEDICAL CARE IF:   You have pain in your arms, neck, jaw, teeth, or back.  Your pain increases or changes in intensity or duration.  You develop nausea, vomiting, or sweating (diaphoresis).  You develop shortness of breath, or you faint.  Your vomit is green, yellow, black, or looks like coffee grounds or blood.  Your stool is red, bloody, or black. These symptoms could be signs of other problems, such as heart disease, gastric bleeding, or esophageal bleeding. MAKE SURE YOU:   Understand these instructions.  Will watch your condition.  Will get help right away if you are not doing well or get worse. Document Released: 09/04/2005 Document Revised:  02/17/2012 Document Reviewed: 06/14/2011 ExitCare Patient Information 2015 [place], [place]. This information is not intended to replace advice given to you by your health care provider. Make sure you discuss any questions you have with your health  care provider.

## 2014-08-08 NOTE — Progress Notes (Signed)
UR Completed.  Nicole Mccormick, [name] [phone] 08/08/2014  

## 2014-08-08 NOTE — Progress Notes (Signed)
  Date: 08/08/2014  Patient name: Nicole Mccormick  Medical record number: [id number]  Date of birth: Feb 14, 1946   I have seen and evaluated Nicole Mccormick and discussed their care with the Residency Team. [name] has well controlled HIV, chronic Hep C not yet tx, iron def anemia, and GERD. She presented for atypical CP that came on when she was doing "nothing". It was epigastric with radiation into chest and was burning. She got a GI cocktail and it went away. Her W/U has included a nl CXR, Trop I x3, and EKG without ischemic changes.   On exam, her vitals are stable except elevated BP. She has a loud holosystolic murmur.   Assessment and Plan: I have seen and evaluated the patient as outlined above. I agree with the formulated Assessment and Plan as detailed in the residents' admission note, with the following changes:   1. Non cardiac CP - she is stable for D/C. Cards has rec an outpt ECHO to assess the murmur as it is out of proportion to her last ECHO findings. If her CP persists after correction of her BP and hgB, they rec outpt stress test.  2. Chronic Hep C - the pt is interested in tx and we have started the W/U. She will F/U Dr [facility] Sim[facility] at [facility].  3. Anemia - pt's hgB was in the 8's and 9's and then she was started on FESO4 and it increased to 13;s. She stopped the FESO4 bc it turned her stools dark and she got constipated so she stopped and her HgB trended back down to 7's. She denies any bleeding and is agreeable to resuming the FeSO4.   Stable for D/C home.  Bartholomew Crews, MD 8/31/20154:54 PM

## 2014-08-08 NOTE — Progress Notes (Signed)
Subjective: Feels fine. No longer has any chest pain or burning abdominal pain. No n/v/diarrhea/fever/chills.  Objective: Vital signs in last 24 hours: Filed Vitals:   08/07/14 1825 08/07/14 2100 08/08/14 0500 08/08/14 0822  BP: 195/66 148/62 178/56   Pulse: 50 59 55   Temp: 98.6 F (37 C) 98.5 F (36.9 C) 99.4 F (37.4 C)   TempSrc: Oral     Resp: 20 18 16    Height:    5\' 2"  (1.575 m)  Weight:    59.92 kg (132 lb 1.6 oz)  SpO2: 100% 100% 100%    Weight change:   Intake/Output Summary (Last 24 hours) at 08/08/14 1217 Last data filed at 08/08/14 0500  Gross per 24 hour  Intake    483 ml  Output      0 ml  Net    483 ml   Vitals reviewed. General: resting in bed, NAD HEENT: PERRL, EOMI, no scleral icterus Cardiac: RRR, holosystolic murmur Pulm: clear to auscultation bilaterally, no wheezes, rales, or rhonchi Abd: soft, nontender, nondistended, BS present Ext: warm and well perfused, no pedal edema Neuro: alert and oriented X3, cranial nerves II-XII grossly intact, strength and sensation to light touch equal in bilateral upper and lower extremities  Lab Results: Basic Metabolic Panel:  Recent Labs Lab 08/07/14 1413 08/08/14 0455  NA 137 138  K 3.6* 3.7  CL 101 103  CO2 26 27  GLUCOSE 110* 123*  BUN 14 11  CREATININE 0.76 0.86  CALCIUM 8.6 8.1*   Liver Function Tests:  Recent Labs Lab 08/07/14 1413  AST 39*  ALT 30  ALKPHOS 67  BILITOT 0.6  PROT 7.3  ALBUMIN 3.1*    Recent Labs Lab 08/07/14 1413  LIPASE 79*   No results found for this basename: AMMONIA,  in the last 168 hours CBC:  Recent Labs Lab 08/07/14 1413 08/08/14 0455 08/08/14 1125  WBC 4.8 3.9* 5.4  NEUTROABS 2.3  --   --   HGB 7.9* 7.2* 7.8*  HCT 26.5* 24.0* 26.4*  MCV 75.3* 76.2* 74.4*  PLT 298 251 289   Cardiac Enzymes:  Recent Labs Lab 08/07/14 1834 08/07/14 2336  TROPONINI <0.30 <0.30   BNP: No results found for this basename: PROBNP,  in the last 168  hours D-Dimer: No results found for this basename: DDIMER,  in the last 168 hours CBG: No results found for this basename: GLUCAP,  in the last 168 hours Hemoglobin A1C: No results found for this basename: HGBA1C,  in the last 168 hours Fasting Lipid Panel: No results found for this basename: CHOL, HDL, LDLCALC, TRIG, CHOLHDL, LDLDIRECT,  in the last 168 hours Thyroid Function Tests: No results found for this basename: TSH, T4TOTAL, FREET4, T3FREE, THYROIDAB,  in the last 168 hours Coagulation:  Recent Labs Lab 08/08/14 1125  LABPROT 13.0  INR 0.98   Anemia Panel:  Recent Labs Lab 08/07/14 1515  VITAMINB12 508  FOLATE 13.0  FERRITIN 3*  TIBC 538*  IRON 11*  RETICCTPCT 1.3   Urine Drug Screen: Drugs of Abuse     Component Value Date/Time   LABOPIA POSITIVE* 03/09/2014 1600   LABOPIA NEGATIVE 12/26/2010 0137   COCAINSCRNUR NONE DETECTED 03/09/2014 1600   COCAINSCRNUR NEGATIVE 12/26/2010 0137   LABBENZ NONE DETECTED 03/09/2014 1600   LABBENZ NEGATIVE 12/26/2010 0137   AMPHETMU NONE DETECTED 03/09/2014 1600   AMPHETMU NEGATIVE 12/26/2010 0137   THCU NONE DETECTED 03/09/2014 1600   LABBARB NONE DETECTED 03/09/2014 1600  Alcohol Level: No results found for this basename: ETH,  in the last 168 hours Urinalysis:  Recent Labs Lab 08/07/14 1620  COLORURINE YELLOW  LABSPEC 1.016  PHURINE 7.0  GLUCOSEU NEGATIVE  HGBUR NEGATIVE  BILIRUBINUR NEGATIVE  KETONESUR NEGATIVE  PROTEINUR NEGATIVE  UROBILINOGEN 1.0  NITRITE NEGATIVE  I[place]. Labs:  Micro Results: No results found for this or any previous visit (from the past 240 hour(s)). Studies/Results: Dg Chest Portable 1 View  08/07/2014   CLINICAL DATA:  68 year old female with abdominal and chest pain.  EXAM: PORTABLE CHEST - 1 VIEW  COMPARISON:  03/08/2014 and prior chest radiographs  FINDINGS: Cardiomegaly again noted.  There is no evidence of focal airspace disease, pulmonary edema, suspicious  pulmonary nodule/mass, pleural effusion, or pneumothorax. No acute bony abnormalities are identified.  IMPRESSION: Cardiomegaly without evidence of active cardiopulmonary disease.   Electronically Signed   By: Hassan Rowan M.D.   On: 08/07/2014 14:49   Medications: I have reviewed the patient's current medications. Scheduled Meds: . amLODipine  10 mg Oral Daily  . aspirin EC  81 mg Oral Daily  . atazanavir  400 mg Oral Q breakfast  . baclofen  20 mg Oral TID  . docusate sodium  100 mg Oral BID  . emtricitabine-tenofovir  1 tablet Oral Daily  . ferrous sulfate  325 mg Oral TID WC  . gabapentin  600 mg Oral TID  . heparin  5,000 Units Subcutaneous 3 times per day  . hydrochlorothiazide  25 mg Oral Daily  . isosorbide mononitrate  30 mg Oral Daily  . lisinopril  40 mg Oral Daily  . sodium chloride  3 mL Intravenous Q12H  . sodium chloride  3 mL Intravenous Q12H  . sucralfate  1 g Oral QID   Continuous Infusions:  PRN Meds:.sodium chloride, acetaminophen-codeine, acyclovir, gi cocktail, promethazine, sodium chloride Assessment/Plan: 68 yo female with hx of HTN, GERD, HIV comes in with burning epigastric pain and with some radiation to her chest after drinking margarita last night.  Chest pain - with epigastric burning pain induced by margarita. Other ddx include MI (less likely as trops negative so far and no changes on EKG). Also pain is atypical for MI. trops negative, EKG shows no ishcemic changes but shows LVH and LAD. - will start protonix daily, continue carafate which was taking at home  - continue asa. Not sure if she has CAD, no notes proving any CAD. LDL has been in 60's.  HTN- runs high in 150's/90's at home.  - continue home meds here: amlodopine 10mg  daily, lisinopril. - cardiology recs appreciated. Added imdur 30mg  po Daily.  Chronic CHF with pEF - impaired relaxation with elevated filling pressures on April 2015. ECHO shows EF 65-70%, LVH. She is euvolemic. - has loud  holosystolic murmur, likely MR murmur, last echo only showed mild MR. Will need to repeat ECHO outpatient. HIV - last CD 4 570 on 03/01/14. Followed by Dr. Johnnye Sima  - cont emtricitabine-tenofovir. cpmt reyataz  HCV  - positive on 2009. Don't see any note showing she was treated or if it was addressed.  - needs to be followed outpatient.  - hep C panel ordered here. Will need Ultrasound full abdomen with elastography. Anemia- hx of iron deficiency anemia. Noncompliant with iron supplements  - hgb is at her baseline prior to starting iron supplements.  - encouraged iron intake and agrees to restart. Will start iron here and continue at home.- bowel regimen to  avoid constipation with iron. Back pain/leg pain  - cont gabapentin   Dispo: Disposition is deferred at this time, awaiting improvement of current medical problems.  Anticipated discharge in approximately today.  The patient does have a current PCP Drucilla Schmidt, MD) and does need an [facility] hospital follow-up appointment after discharge.  The patient does not have transportation limitations that hinder transportation to clinic appointments.  .Services Needed at time of discharge: Y = Yes, Blank = No PT:   OT:   RN:   Equipment:   Other:     LOS: 1 day   Dellia Nims, MD 08/08/2014, 12:17 PM

## 2014-08-09 LAB — HIV 1/2 CONFIRMATION
HIV-1 antibody: POSITIVE — AB
HIV-2 Ab: NEGATIVE

## 2014-08-09 LAB — HIV ANTIBODY (ROUTINE TESTING W REFLEX): HIV 1&2 Ab, 4th Generation: REACTIVE — AB

## 2014-08-09 LAB — ANA: Anti Nuclear Antibody(ANA): NEGATIVE

## 2014-08-11 LAB — HEPATITIS C VRS RNA DETECT BY PCR-QUAL: Hepatitis C Vrs RNA by PCR-Qual: POSITIVE — AB

## 2014-08-12 ENCOUNTER — Ambulatory Visit (INDEPENDENT_AMBULATORY_CARE_PROVIDER_SITE_OTHER): Payer: PRIVATE HEALTH INSURANCE | Admitting: Cardiology

## 2014-08-12 ENCOUNTER — Encounter: Payer: Self-pay | Admitting: Cardiology

## 2014-08-12 VITALS — BP 130/64 | HR 61 | Ht 62.0 in | Wt 128.0 lb

## 2014-08-12 DIAGNOSIS — D509 Iron deficiency anemia, unspecified: Secondary | ICD-10-CM

## 2014-08-12 DIAGNOSIS — I5032 Chronic diastolic (congestive) heart failure: Secondary | ICD-10-CM

## 2014-08-12 DIAGNOSIS — R079 Chest pain, unspecified: Secondary | ICD-10-CM

## 2014-08-12 DIAGNOSIS — I10 Essential (primary) hypertension: Secondary | ICD-10-CM

## 2014-08-12 NOTE — Progress Notes (Signed)
Patient ID: Nicole Mccormick, female   DOB: July 12, 1946, 67 y.o.   MRN: [id number]    Patient Name: Nicole Mccormick Date of Encounter: 08/12/2014  Primary Care Provider:  Drucilla Schmidt, MD Primary Cardiologist: Dorothy Spark  Problem List   Past Medical History  Diagnosis Date  . Hypertension   . Abnormal Pap smear   . Asthma   . Coronary artery disease   . HIV infection   . Varicosities   . Seizures     last sz fri nov 2  . GERD (gastroesophageal reflux disease)     previously on aciphex, discontinued november 2012  because patient asymptomatic, and concern about interference with HIV meds.  May try pepcid in the future if symptoms return  . Iron deficiency anemia     Ferritin = 2 in november 2012, started on iron supplemenation  . Arthritis   . CHF (congestive heart failure)   . Heart murmur    Past Surgical History  Procedure Laterality Date  . Abdominal hysterectomy    . Esophagogastroduodenoscopy  10/13/11    small hiatal hernia  . Colonoscopy  10/13/11    small adenoma, anal condyloma  . Colonoscopy  10/13/2011    Procedure: COLONOSCOPY;  Surgeon: Gatha Mayer, MD;  Location: [facility];  Service: Endoscopy;  Laterality: N/A;  . Esophagogastroduodenoscopy  10/13/2011    Procedure: ESOPHAGOGASTRODUODENOSCOPY (EGD);  Surgeon: Gatha Mayer, MD;  Location: [facility] ENDOSCOPY;  Service: Endoscopy;  Laterality: N/A;    Allergies  Allergies  Allergen Reactions  . Penicillins     REACTION: GOES INTO SHOCK & THEN PASSES OUT  . Avelox [Moxifloxacin Hcl In Nacl] Itching and Rash    HPI  Patient is a 68 yo F PMHx significant for HTN, CAD, HIV, GERD, CHF, tobacco abuse, iron deficiency anemia presenting to the ED for acute onset epigastric pain with radiation into chest with associated nausea, non-bloody non-bilious emesis, and shortness of breath that began last evening at 8PM. Alleviating factors: none. Aggravating factors: none.  The patient states that she had 2 heart attacks in  the past (no records), but that no cath or stenting was done. She states that she had stress test, but again there are no records.  She states that she suffers from GERD and that her pain yesterday happened at rest, was located at her epigastrium and typical for her GERD. She is very comfortable right now and chest pain free. She also denies SOB, exertional dyspnea, palpitations, syncope, LE edema, orthopnea or PND.  The patient has been smoking 1 PPD for the last 50 years, no intention to stop.  She was just discharged from the hospital 1 week ago. She is complaint with her meds. She reports no more chest pain, SOB, palpitations or syncope. She has an appointment with her PCP for anemia.   Home Medications  Prior to Admission medications   Medication Sig Start Date End Date Taking? Authorizing Provider  acetaminophen-codeine (TYLENOL #3) 300-30 MG per tablet Take 1 tablet by mouth every 8 (eight) hours as needed for moderate pain or severe pain. 05/23/14  Yes Marin Olp, MD  acyclovir (ZOVIRAX) 400 MG tablet Take 400 mg by mouth daily as needed (for flare ups).  12/29/13  Yes Historical Provider, MD  amLODipine (NORVASC) 10 MG tablet Take 1 tablet (10 mg total) by mouth daily. 03/10/14  Yes Lesly Dukes, MD  aspirin EC 81 MG tablet Take 1 tablet (81 mg total) by mouth daily. 02/26/13  Yes [name], MD  atazanavir (REYATAZ) 200 MG capsule Take 400 mg by mouth daily with breakfast. **SPACED 12 HOURS FROM ANTACIDS** 02/28/14  Yes Carlyle Basques, MD  baclofen (LIORESAL) 20 MG tablet Take 20 mg by mouth 3 (three) times daily. 03/04/14  Yes [name], MD  Calcium Carbonate Antacid (ANTACID PO) Take 1 tablet by mouth 2 (two) times daily as needed (acid reflux).   Yes Historical Provider, MD  docusate sodium 100 MG CAPS Take 100 mg by mouth 2 (two) times daily. 08/08/14  Yes Tasrif Ahmed, MD  emtricitabine-tenofovir (TRUVADA) 200-300 MG per tablet Take 1 tablet by mouth daily. 02/28/14  Yes  Carlyle Basques, MD  famotidine (PEPCID) 40 MG tablet Take 1 tablet (40 mg total) by mouth daily. 08/08/14  Yes Tasrif Ahmed, MD  ferrous sulfate 325 (65 FE) MG tablet Take 1 tablet (325 mg total) by mouth 3 (three) times daily with meals. 08/08/14  Yes Tasrif Ahmed, MD  gabapentin (NEURONTIN) 600 MG tablet Take 1 tablet (600 mg total) by mouth 3 (three) times daily. 03/04/14  Yes [name], MD  isosorbide mononitrate (IMDUR) 30 MG 24 hr tablet Take 1 tablet (30 mg total) by mouth daily. 08/08/14  Yes Tasrif Ahmed, MD  lisinopril-hydrochlorothiazide (PRINZIDE,ZESTORETIC) 20-12.5 MG per tablet Take 2 tablets by mouth daily. 03/04/14  Yes [name], MD  Nutritional Supplements (ENSURE NUTRA SHAKE HI-CAL) LIQD Take 1 Can by mouth 2 (two) times daily. 06/13/14  Yes [name], MD  Nutritional Supplements (ENSURE NUTRA SHAKE HI-CAL) LIQD Take 1 Can by mouth 2 (two) times daily. 08/08/14  Yes Tasrif Ahmed, MD  sucralfate (CARAFATE) 1 G tablet Take 1 tablet (1 g total) by mouth 4 (four) times daily. 12/07/12  Yes Campbell Riches, MD  sucralfate (CARAFATE) 1 G tablet Take 1 tablet (1 g total) by mouth 4 (four) times daily. 08/08/14  Yes Dellia Nims, MD    Family History  Family History  Problem Relation Age of Onset  . Diabetes Mother   . Ovarian cancer Sister   . Cancer Sister     ovarian and colon  . Colon cancer Neg Hx     Social History  History   Social History  . Marital Status: Single    Spouse Name: N/A    Number of Children: N/A  . Years of Education: N/A   Occupational History  . Not on file.   Social History Main Topics  . Smoking status: Current Every Day Smoker -- 0.50 packs/day for 50 years    Types: Cigarettes  . Smokeless tobacco: Never Used     Comment: working on it.  . Alcohol Use: 1.2 oz/week    2 Glasses of wine per week     Comment:  sometimes a glass of wine.  . Drug Use: No  . Sexual Activity: Not on file     Comment: pt. given condoms     Other Topics Concern  . Not on file   Social History Narrative  . No narrative on file     Review of Systems, as per HPI, otherwise negative General:  No chills, fever, night sweats or weight changes.  Cardiovascular:  No chest pain, dyspnea on exertion, edema, orthopnea, palpitations, paroxysmal nocturnal dyspnea. Dermatological: No rash, lesions/masses Respiratory: No cough, dyspnea Urologic: No hematuria, dysuria Abdominal:   No nausea, vomiting, diarrhea, bright red blood per rectum, melena, or hematemesis Neurologic:  No visual changes, wkns, changes in mental  status. All other systems reviewed and are otherwise negative except as noted above.  Physical Exam  Blood pressure 130/64, pulse 61, height 5\' 2"  (1.575 m), weight 128 lb (58.06 kg).  General: Pleasant, NAD Psych: Normal affect. Neuro: Alert and oriented X 3. Moves all extremities spontaneously. HEENT: Normal  Neck: Supple without bruits or JVD. Lungs:  Resp regular and unlabored, CTA. Heart: RRR no s3, s4, or murmurs. Abdomen: Soft, non-tender, non-distended, BS + x 4.  Extremities: No clubbing, cyanosis or edema. DP/PT/Radials 2+ and equal bilaterally.  Labs:  No results found for this basename: CKTOTAL, CKMB, TROPONINI,  in the last 72 hours Lab Results  Component Value Date   WBC 5.4 08/08/2014   HGB 7.8* 08/08/2014   HCT 26.4* 08/08/2014   MCV 74.4* 08/08/2014   PLT 289 08/08/2014    No results found for this basename: DDIMER   No components found with this basename: POCBNP,     Component Value Date/Time   NA 138 08/08/2014 0455   K 3.7 08/08/2014 0455   CL 103 08/08/2014 0455   CO2 27 08/08/2014 0455   GLUCOSE 123* 08/08/2014 0455   BUN 11 08/08/2014 0455   CREATININE 0.86 08/08/2014 0455   CREATININE 0.71 03/08/2014 1558   CALCIUM 8.1* 08/08/2014 0455   PROT 7.3 08/07/2014 1413   ALBUMIN 3.1* 08/07/2014 1413   AST 39* 08/07/2014 1413   ALT 30 08/07/2014 1413   ALKPHOS 67 08/07/2014 1413   BILITOT 0.6  08/07/2014 1413   GFRNONAA 67* 08/08/2014 0455   GFRNONAA 87 03/08/2014 1558   GFRAA 78* 08/08/2014 0455   GFRAA >89 03/08/2014 1558   Lab Results  Component Value Date   CHOL 127 03/01/2014   HDL 51 03/01/2014   LDLCALC 56 03/01/2014   TRIG 102 03/01/2014    Accessory Clinical Findings  Echocardiogram - 03/09/2014 - Left ventricle: The cavity size was normal. Wall thickness was increased in a pattern of mild LVH. Systolic function was vigorous. The estimated ejection fraction was in the range of 65% to 70%. Wall motion was normal; there were no regional wall motion abnormalities. Doppler parameters are consistent with abnormal left ventricular relaxation (grade 1 diastolic dysfunction). Doppler parameters are consistent with high ventricular filling pressure. - Mitral valve: Calcified annulus. Mild regurgitation. - Left atrium: The atrium was mildly dilated. - Pulmonary arteries: Systolic pressure was mildly increased. PA peak pressure: 48mm Hg (S). Impressions:  - Mildly elevated LVOT gradient of 2.1 m/s; midcavitary gradient of approximately 2.5 m/s most likely related to vigorous LV function.  ECG - SR, LAD, iRBBB + LAFB   Assessment & Plan  Patient is a 68 yo F PMHx significant for HTN, CAD, HIV, GERD, CHF, tobacco abuse, iron deficiency anemia   1. Atypical chest pain - her pain is very atypical and seems to be related to GERD. Resolved after BP controlled. She needs to follow with PCP for severe microcytic anemia.  If anemia corrected and symptoms recur I would recommend to perform a stress test with imaging (stress echo, nuclear stress test).  ECG shows just LVH, otherwise normal, troponin negative x 3  No further ischemic workup needed at this time.   2. Hypertension - controlled   3. Chronic CHF with pEF - impaired relaxation with elevated filling pressures in April 2015, she appears euvolemic   4. Loud holosystolic murmur - LVOT and midcavitary gradients, ideally I  would like her to take beta-blockers, however she is bradycardiac at baseline. She  is advised to stay hydrated.    Dorothy Spark, MD, [facility] 08/12/2014, 11:57 AM

## 2014-08-12 NOTE — Patient Instructions (Signed)
Your physician recommends that you continue on your current medications as directed. Please refer to the Current Medication list given to you today.  Your physician recommends that you schedule a follow-up appointment in: 3 months  

## 2014-08-16 ENCOUNTER — Telehealth: Payer: Self-pay | Admitting: Licensed Clinical Social Worker

## 2014-08-16 NOTE — Telephone Encounter (Signed)
Patient states that she was just discharged from the hospital, and states that she was told that she needs to be getting Ensure for her recent weight loss. BMI is 23, but I did notice 20 lb weight loss from June until now. Please advise

## 2014-08-17 ENCOUNTER — Other Ambulatory Visit: Payer: Self-pay | Admitting: Licensed Clinical Social Worker

## 2014-08-17 MED ORDER — ENSURE NUTRA SHAKE HI-CAL PO LIQD: 1.0000 | Freq: Two times a day (BID) | ORAL | Status: DC

## 2014-08-17 NOTE — Telephone Encounter (Signed)
Ok to give for 1 month. Needs f/u appt, consider megace, marinol at that time.  thanks

## 2014-09-08 ENCOUNTER — Other Ambulatory Visit (HOSPITAL_COMMUNITY): Payer: Self-pay | Admitting: Internal Medicine

## 2014-09-08 DIAGNOSIS — K219 Gastro-esophageal reflux disease without esophagitis: Secondary | ICD-10-CM

## 2014-09-12 ENCOUNTER — Encounter: Payer: Self-pay | Admitting: Infectious Diseases

## 2014-09-12 ENCOUNTER — Emergency Department (HOSPITAL_COMMUNITY): Payer: PRIVATE HEALTH INSURANCE

## 2014-09-12 ENCOUNTER — Telehealth: Payer: Self-pay | Admitting: Internal Medicine

## 2014-09-12 ENCOUNTER — Inpatient Hospital Stay (HOSPITAL_COMMUNITY)
Admission: EM | Admit: 2014-09-12 | Discharge: 2014-09-15 | DRG: 377 | Disposition: A | Discharge status: Home / Self Care | Payer: PRIVATE HEALTH INSURANCE | Attending: Internal Medicine | Admitting: Internal Medicine

## 2014-09-12 ENCOUNTER — Other Ambulatory Visit: Payer: Self-pay | Admitting: *Deleted

## 2014-09-12 ENCOUNTER — Ambulatory Visit (INDEPENDENT_AMBULATORY_CARE_PROVIDER_SITE_OTHER): Payer: PRIVATE HEALTH INSURANCE | Admitting: Infectious Diseases

## 2014-09-12 ENCOUNTER — Encounter (HOSPITAL_COMMUNITY): Payer: Self-pay | Admitting: Emergency Medicine

## 2014-09-12 VITALS — BP 183/66 | HR 80 | Temp 98.1°F | Wt 130.0 lb

## 2014-09-12 DIAGNOSIS — B20 Human immunodeficiency virus [HIV] disease: Secondary | ICD-10-CM

## 2014-09-12 DIAGNOSIS — D649 Anemia, unspecified: Secondary | ICD-10-CM | POA: Diagnosis not present

## 2014-09-12 DIAGNOSIS — R634 Abnormal weight loss: Secondary | ICD-10-CM | POA: Diagnosis present

## 2014-09-12 DIAGNOSIS — F17210 Nicotine dependence, cigarettes, uncomplicated: Secondary | ICD-10-CM | POA: Diagnosis present

## 2014-09-12 DIAGNOSIS — G2581 Restless legs syndrome: Secondary | ICD-10-CM | POA: Diagnosis present

## 2014-09-12 DIAGNOSIS — I10 Essential (primary) hypertension: Secondary | ICD-10-CM | POA: Diagnosis present

## 2014-09-12 DIAGNOSIS — K31811 Angiodysplasia of stomach and duodenum with bleeding: Secondary | ICD-10-CM | POA: Diagnosis not present

## 2014-09-12 DIAGNOSIS — Z9114 Patient's other noncompliance with medication regimen: Secondary | ICD-10-CM | POA: Diagnosis present

## 2014-09-12 DIAGNOSIS — Z7982 Long term (current) use of aspirin: Secondary | ICD-10-CM

## 2014-09-12 DIAGNOSIS — B182 Chronic viral hepatitis C: Secondary | ICD-10-CM

## 2014-09-12 DIAGNOSIS — K219 Gastro-esophageal reflux disease without esophagitis: Secondary | ICD-10-CM | POA: Diagnosis present

## 2014-09-12 DIAGNOSIS — I252 Old myocardial infarction: Secondary | ICD-10-CM

## 2014-09-12 DIAGNOSIS — D509 Iron deficiency anemia, unspecified: Secondary | ICD-10-CM

## 2014-09-12 DIAGNOSIS — B1920 Unspecified viral hepatitis C without hepatic coma: Secondary | ICD-10-CM | POA: Diagnosis present

## 2014-09-12 DIAGNOSIS — M4690 Unspecified inflammatory spondylopathy, site unspecified: Secondary | ICD-10-CM | POA: Diagnosis present

## 2014-09-12 DIAGNOSIS — F17200 Nicotine dependence, unspecified, uncomplicated: Secondary | ICD-10-CM | POA: Diagnosis present

## 2014-09-12 DIAGNOSIS — D62 Acute posthemorrhagic anemia: Secondary | ICD-10-CM | POA: Diagnosis present

## 2014-09-12 DIAGNOSIS — Z79899 Other long term (current) drug therapy: Secondary | ICD-10-CM

## 2014-09-12 DIAGNOSIS — Z23 Encounter for immunization: Secondary | ICD-10-CM

## 2014-09-12 DIAGNOSIS — I2510 Atherosclerotic heart disease of native coronary artery without angina pectoris: Secondary | ICD-10-CM | POA: Diagnosis present

## 2014-09-12 HISTORY — DX: Other specified abnormal immunological findings in serum: R76.8

## 2014-09-12 LAB — COMPREHENSIVE METABOLIC PANEL
ALT: 79 U/L — ABNORMAL HIGH (ref 0–35)
ALT: 72 U/L — ABNORMAL HIGH (ref 0–35)
AST: 84 U/L — ABNORMAL HIGH (ref 0–37)
AST: 81 U/L — ABNORMAL HIGH (ref 0–37)
Albumin: 3.8 g/dL (ref 3.5–5.2)
Albumin: 3.2 g/dL — ABNORMAL LOW (ref 3.5–5.2)
Alkaline Phosphatase: 76 U/L (ref 39–117)
Alkaline Phosphatase: 79 U/L (ref 39–117)
Anion gap: 11 (ref 5–15)
BUN: 19 mg/dL (ref 6–23)
BUN: 19 mg/dL (ref 6–23)
CO2: 24 mEq/L (ref 19–32)
CO2: 25 mEq/L (ref 19–32)
Calcium: 9 mg/dL (ref 8.4–10.5)
Calcium: 8.6 mg/dL (ref 8.4–10.5)
Chloride: 104 mEq/L (ref 96–112)
Chloride: 105 mEq/L (ref 96–112)
Creat: 0.93 mg/dL (ref 0.50–1.10)
Creatinine, Ser: 0.91 mg/dL (ref 0.50–1.10)
GFR calc Af Amer: 72 mL/min — ABNORMAL LOW (ref >90)
GFR calc non Af Amer: 62 mL/min — ABNORMAL LOW (ref >90)
Glucose, Bld: 84 mg/dL (ref 70–99)
Glucose, Bld: 112 mg/dL — ABNORMAL HIGH (ref 70–99)
Potassium: 4.2 mEq/L (ref 3.5–5.3)
Potassium: 3.7 mEq/L (ref 3.7–5.3)
Sodium: 136 mEq/L (ref 135–145)
Sodium: 141 mEq/L (ref 137–147)
Total Bilirubin: 0.7 mg/dL (ref 0.2–1.2)
Total Bilirubin: 0.5 mg/dL (ref 0.3–1.2)
Total Protein: 7.5 g/dL (ref 6.0–8.3)
Total Protein: 7.6 g/dL (ref 6.0–8.3)

## 2014-09-12 LAB — CBC WITH DIFFERENTIAL/PLATELET
Basophils Absolute: 0 10*3/uL (ref 0.0–0.1)
Basophils Absolute: 0 10*3/uL (ref 0.0–0.1)
Basophils Relative: 1 % (ref 0–1)
Basophils Relative: 1 % (ref 0–1)
Eosinophils Absolute: 0 10*3/uL (ref 0.0–0.7)
Eosinophils Absolute: 0.1 10*3/uL (ref 0.0–0.7)
Eosinophils Relative: 1 % (ref 0–5)
Eosinophils Relative: 2 % (ref 0–5)
HCT: 22.4 % — ABNORMAL LOW (ref 36.0–46.0)
HCT: 20.3 % — ABNORMAL LOW (ref 36.0–46.0)
Hemoglobin: 6.2 g/dL — CL (ref 12.0–15.0)
Hemoglobin: 5.7 g/dL — CL (ref 12.0–15.0)
Lymphocytes Relative: 33 % (ref 12–46)
Lymphocytes Relative: 34 % (ref 12–46)
Lymphs Abs: 1.6 10*3/uL (ref 0.7–4.0)
Lymphs Abs: 1.6 10*3/uL (ref 0.7–4.0)
MCH: 20.9 pg — ABNORMAL LOW (ref 26.0–34.0)
MCH: 20.7 pg — ABNORMAL LOW (ref 26.0–34.0)
MCHC: 27.2 g/dL — ABNORMAL LOW (ref 30.0–36.0)
MCHC: 28.1 g/dL — ABNORMAL LOW (ref 30.0–36.0)
MCV: 73.9 fL — ABNORMAL LOW (ref 78.0–100.0)
MCV: 73.8 fL — ABNORMAL LOW (ref 78.0–100.0)
Monocytes Absolute: 0.5 10*3/uL (ref 0.1–1.0)
Monocytes Absolute: 0.6 10*3/uL (ref 0.1–1.0)
Monocytes Relative: 10 % (ref 3–12)
Monocytes Relative: 12 % (ref 3–12)
Neutro Abs: 2.6 10*3/uL (ref 1.7–7.7)
Neutro Abs: 2.3 10*3/uL (ref 1.7–7.7)
Neutrophils Relative %: 55 % (ref 43–77)
Neutrophils Relative %: 51 % (ref 43–77)
Platelets: 305 10*3/uL (ref 150–400)
Platelets: 276 10*3/uL (ref 150–400)
RBC: 2.96 MIL/uL — ABNORMAL LOW (ref 3.87–5.11)
RBC: 2.75 MIL/uL — ABNORMAL LOW (ref 3.87–5.11)
RDW: 19.7 % — ABNORMAL HIGH (ref 11.5–15.5)
RDW: 19.6 % — ABNORMAL HIGH (ref 11.5–15.5)
WBC: 4.7 10*3/uL (ref 4.0–10.5)
WBC: 4.6 10*3/uL (ref 4.0–10.5)

## 2014-09-12 LAB — PROTIME-INR
INR: 1.08 (ref <1.50)
INR: 1.16 (ref 0.00–1.49)
Prothrombin Time: 14 seconds (ref 11.6–15.2)
Prothrombin Time: 14.8 seconds (ref 11.6–15.2)

## 2014-09-12 LAB — IRON: Iron: 13 ug/dL — ABNORMAL LOW (ref 42–145)

## 2014-09-12 LAB — PREPARE RBC (CROSSMATCH)

## 2014-09-12 LAB — APTT: aPTT: 32 seconds (ref 24–37)

## 2014-09-12 MED ORDER — SODIUM CHLORIDE 0.9 % IV SOLN
10.0000 mL/h | Freq: Once | INTRAVENOUS | Status: AC
  Administered 2014-09-13: 10 mL/h via INTRAVENOUS

## 2014-09-12 MED ORDER — ENSURE NUTRA SHAKE HI-CAL PO LIQD: 1.0000 | Freq: Two times a day (BID) | ORAL | Status: DC

## 2014-09-12 MED ORDER — FERROUS SULFATE 325 (65 FE) MG PO TABS: 325.0000 mg | ORAL_TABLET | Freq: Three times a day (TID) | ORAL | Status: DC

## 2014-09-12 MED ORDER — HYDROCOD POLST-CHLORPHEN POLST 10-8 MG/5ML PO LQCR: 5.0000 mL | Freq: Two times a day (BID) | ORAL | Status: DC | PRN

## 2014-09-12 NOTE — Assessment & Plan Note (Signed)
She will f/u with PCP. She has a murmur on her exam, which is not mentioned on her echo 03-2014. Would query if she needs repeat? My great appreciation to [employer]

## 2014-09-12 NOTE — Telephone Encounter (Signed)
  Reason for call:   I placed an outgoing call to [name] at 8:30PM on 09/12/14 regarding a critical hemoglobin level of 6.2 (received call from [facility] regarding this-->Dr. Johnnye Sima had checked labs on her today).  She has a h/o iron deficiency anemia for which she tells me that she takes ferrous sulfate "once and a while."  I explained to her that she was supposed to take it three times daily with meals (to avoid GI upset).  She stated that she was not aware that she needed to take it three times daily.  She denied having any s/s of anemia including no HA, dizziness, chest pain, palpitations, or shortness of breath.  She denies any source of bleeding including no hematuria, hematochezia, melena, vaginal bleeding, or hematemesis.  She denied every having ever received a blood transfusion in the past.    Assessment/ Plan:   Iron deficiency anemia: pt has a h/o iron def anemia for which she reports noncompliance with ferrous sulfate.  Hgb was normal until August 2015.  Seen by GI (Dr. Carlean Purl) 2012 for heme +stool.  Colonoscopy 2012 revealed anorectal lesion and polyp.  She was advised to continue iron supplementation.  EGD 2012 revealed a small hiatal hernia but was otherwise normal.  Iron studies on 08/07/14 revealed iron of 11 and ferritin of 3.  She denied any current symptoms and I instructed her to come to the ED for further evaluation and recheck of her hgb.  I told her she will likely need a blood transfusion and at the very least she should get feraheme.  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  She told me that she would come to the [facility] in the AM.    As always, pt is advised that if symptoms worsen or new symptoms arise, they should go to an urgent care facility or to to ER for further evaluation.   Jones Bales, MD   09/12/2014, 9:15 PM

## 2014-09-12 NOTE — ED Provider Notes (Signed)
CSN: [id number]     Arrival date & time 09/12/14  2212 History   First MD Initiated Contact with Patient 09/12/14 2259     Chief Complaint  Patient presents with  . Anemia     (Consider location/radiation/quality/duration/timing/severity/associated sxs/prior Treatment) HPI  Nicole Mccormick is a pleasant 68 y.o. female with PMH of HIV, CHF, Iron def anemia sent by Dr. Johnnye Sima for anemia. Blood drawn today I the office shows hemoglobin of 6.2. Patient denies chest pain, shortness of breath, melena, hematochezia, vaginal bleeding, nausea, vomiting, diarrhea. She has a history of iron deficiency anemia and is taking her iron pills incorrectly. She is taken one every 1-2 weeks as she feels. It was explained to her today she should be taking them 3 times a day, regularly with meals. Patient states that she did not know that. She is also using Bayer back and body for arthritic pain. States she takes between 5 and 10 pills a day. She denies any epigastric pain, or alcohol abuse. States she drinks wine occasionally. No history of blood transfusions. Had colonoscopy in 20 1214 positive stool which did not have significant abnormalities.    Past Medical History  Diagnosis Date  . Hypertension   . Abnormal Pap smear   . Asthma   . Coronary artery disease   . HIV infection   . Varicosities   . Seizures     last sz fri nov 2  . GERD (gastroesophageal reflux disease)     previously on aciphex, discontinued november 2012  because patient asymptomatic, and concern about interference with HIV meds.  May try pepcid in the future if symptoms return  . Iron deficiency anemia     Ferritin = 2 in november 2012, started on iron supplemenation  . Arthritis   . CHF (congestive heart failure)   . Heart murmur    Past Surgical History  Procedure Laterality Date  . Abdominal hysterectomy    . Esophagogastroduodenoscopy  10/13/11    small hiatal hernia  . Colonoscopy  10/13/11    small adenoma, anal condyloma  .  Colonoscopy  10/13/2011    Procedure: COLONOSCOPY;  Surgeon: Gatha Mayer, MD;  Location: [facility];  Service: Endoscopy;  Laterality: N/A;  . Esophagogastroduodenoscopy  10/13/2011    Procedure: ESOPHAGOGASTRODUODENOSCOPY (EGD);  Surgeon: Gatha Mayer, MD;  Location: [facility] ENDOSCOPY;  Service: Endoscopy;  Laterality: N/A;   Family History  Problem Relation Age of Onset  . Diabetes Mother   . Ovarian cancer Sister   . Cancer Sister     ovarian and colon  . Colon cancer Neg Hx    History  Substance Use Topics  . Smoking status: Current Every Day Smoker -- 0.25 packs/day for 50 years    Types: Cigarettes  . Smokeless tobacco: Never Used     Comment: working on it.  . Alcohol Use: 1.2 oz/week    2 Glasses of wine per week     Comment:  sometimes a glass of wine.   OB History   Grav Para Term Preterm Abortions TAB SAB Ect Mult Living   6 5   1  1   5      Review of Systems  10 systems reviewed and found to be negative, except as noted in the HPI.  Allergies  Penicillins and Avelox  Home Medications   Prior to Admission medications   Medication Sig Start Date End Date Taking? Authorizing Provider  acetaminophen-codeine (TYLENOL #3) 300-30 MG per tablet Take  1 tablet by mouth every 8 (eight) hours as needed for moderate pain or severe pain. 05/23/14   Marin Olp, MD  acyclovir (ZOVIRAX) 400 MG tablet Take 400 mg by mouth daily as needed (for flare ups).  12/29/13   Historical Provider, MD  amLODipine (NORVASC) 10 MG tablet Take 1 tablet (10 mg total) by mouth daily. 03/10/14   Lesly Dukes, MD  aspirin EC 81 MG tablet Take 1 tablet (81 mg total) by mouth daily. 02/26/13   [name], MD  atazanavir (REYATAZ) 200 MG capsule Take 400 mg by mouth daily with breakfast. **SPACED 12 HOURS FROM ANTACIDS** 02/28/14   Carlyle Basques, MD  baclofen (LIORESAL) 20 MG tablet Take 20 mg by mouth 3 (three) times daily. 03/04/14   [name], MD  Calcium Carbonate Antacid  (ANTACID PO) Take 1 tablet by mouth 2 (two) times daily as needed (acid reflux).    Historical Provider, MD  chlorpheniramine-HYDROcodone (TUSSIONEX PENNKINETIC ER) 10-8 MG/5ML LQCR Take 5 mLs by mouth every 12 (twelve) hours as needed for cough. 09/12/14   Campbell Riches, MD  docusate sodium 100 MG CAPS Take 100 mg by mouth 2 (two) times daily. 08/08/14   Tasrif Ahmed, MD  emtricitabine-tenofovir (TRUVADA) 200-300 MG per tablet Take 1 tablet by mouth daily. 02/28/14   Carlyle Basques, MD  famotidine (PEPCID) 40 MG tablet Take 1 tablet (40 mg total) by mouth daily. 08/08/14   Tasrif Ahmed, MD  ferrous sulfate 325 (65 FE) MG tablet Take 1 tablet (325 mg total) by mouth 3 (three) times daily with meals. 09/12/14   Campbell Riches, MD  gabapentin (NEURONTIN) 600 MG tablet Take 1 tablet (600 mg total) by mouth 3 (three) times daily. 03/04/14   [name], MD  isosorbide mononitrate (IMDUR) 30 MG 24 hr tablet Take 1 tablet (30 mg total) by mouth daily. 08/08/14   Dellia Nims, MD  lisinopril-hydrochlorothiazide (PRINZIDE,ZESTORETIC) 20-12.5 MG per tablet Take 2 tablets by mouth daily. 03/04/14   [name], MD  Nutritional Supplements (ENSURE NUTRA SHAKE HI-CAL) LIQD Take 1 Can by mouth 2 (two) times daily. 06/13/14   [name], MD  Nutritional Supplements (ENSURE NUTRA SHAKE HI-CAL) LIQD Take 1 Can by mouth 2 (two) times daily. 09/12/14   Campbell Riches, MD  sucralfate (CARAFATE) 1 G tablet Take 1 tablet (1 g total) by mouth 4 (four) times daily. 09/08/14   Karren Cobble, MD   BP 175/61  Pulse 61  Temp(Src) 98.4 F (36.9 C) (Oral)  Resp 14  Ht 5\' 2"  (1.575 m)  Wt 130 lb (58.968 kg)  BMI 23.77 kg/m2  SpO2 99% Physical Exam  Nursing note and vitals reviewed. Constitutional: She is oriented to person, place, and time. She appears well-developed and well-nourished. No distress.  HENT:  Head: Normocephalic.  Mouth/Throat: Oropharynx is clear and moist.   Significant  conjunctival pallor  Eyes: Conjunctivae and EOM are normal. Pupils are equal, round, and reactive to light.  Neck: Normal range of motion.  Cardiovascular: Normal rate, regular rhythm and intact distal pulses.   Pulmonary/Chest: Effort normal and breath sounds normal. No stridor. No respiratory distress. She has no wheezes. She has no rales. She exhibits no tenderness.  Abdominal: Soft. Bowel sounds are normal. She exhibits no distension and no mass. There is no tenderness. There is no rebound and no guarding.  Genitourinary: Guaiac negative stool.  Rectal exam a chaperoned by nurse:  No rashes or lesions,  external hemorrhoids, normal rectal tone, normal stool color.  Musculoskeletal: Normal range of motion. She exhibits no edema and no tenderness.  Neurological: She is alert and oriented to person, place, and time.  Psychiatric: She has a normal mood and affect.    ED Course  Procedures (including critical care time)  CRITICAL CARE Performed by: Monico Blitz   Total critical care time: 35  Critical care time was exclusive of separately billable procedures and treating other patients.  Critical care was necessary to treat or prevent imminent or life-threatening deterioration.  Critical care was time spent personally by me on the following activities: development of treatment plan with patient and/or surrogate as well as nursing, discussions with consultants, evaluation of patient's response to treatment, examination of patient, obtaining history from patient or surrogate, ordering and performing treatments and interventions, ordering and review of laboratory studies, ordering and review of radiographic studies, pulse oximetry and re-evaluation of patient's condition.  Labs Review Labs Reviewed  CBC WITH DIFFERENTIAL - Abnormal; Notable for the following:    RBC 2.75 (*)    Hemoglobin 5.7 (*)    HCT 20.3 (*)    MCV 73.8 (*)    MCH 20.7 (*)    MCHC 28.1 (*)    RDW 19.6 (*)     All other components within normal limits  COMPREHENSIVE METABOLIC PANEL - Abnormal; Notable for the following:    Glucose, Bld 112 (*)    Albumin 3.2 (*)    AST 81 (*)    ALT 72 (*)    GFR calc non Af Amer 62 (*)    GFR calc Af Amer 72 (*)    All other components within normal limits  PROTIME-INR  APTT  OCCULT BLOOD X 1 CARD TO LAB, STOOL  VITAMIN B12  FOLATE  IRON AND TIBC  FERRITIN  RETICULOCYTES  TYPE AND SCREEN  PREPARE RBC (CROSSMATCH)    Imaging Review No results found.   EKG Interpretation None      MDM   Final diagnoses:  Anemia, unspecified anemia type    Filed Vitals:   09/12/14 2221 09/12/14 2309 09/13/14 0002  BP: 175/61 171/57 176/60  Pulse: 61 58 65  Temp: 98.4 F (36.9 C) 97.8 F (36.6 C)   TempSrc: Oral Oral   Resp: 14 18 16   Height: 5\' 2"  (1.575 m)    Weight: 130 lb (58.968 kg)    SpO2: 99% 100% 99%    Medications  0.9 %  sodium chloride infusion (not administered)  pantoprazole (PROTONIX) 80 mg in sodium chloride 0.9 % 250 mL (0.32 mg/mL) infusion (not administered)  pantoprazole (PROTONIX) 80 mg in sodium chloride 0.9 % 100 mL IVPB (not administered)    Quinci Gavidia is a 68 y.o. female presenting with anemia confirmed with a hemoglobin of 5.7. Patient has iron deficiency anemia and is noncompliant with her iron supplementation. Patient also takes excessive beer aspirin however there is no abdominal pain, melena, hematochezia, she is asymptomatic with no findings of symptomatic anemia. Patient will be transfused one unit, guaiac is negative. Patient's anemia panel was drawn before blood is transfused.  Patient will be admitted to internal medicine teaching service, case discussed with resident Dr. Sherrine Maples. Who will evaluate the patient and put him holding orders.  This is a shared visit with the attending physician who personally evaluated the patient and agrees with the care plan.     Monico Blitz, PA-C 09/13/14 [phone]

## 2014-09-12 NOTE — Assessment & Plan Note (Signed)
Will re-writer her iron

## 2014-09-12 NOTE — ED Notes (Signed)
Pt. advised by Dr. Jillyn Hidden to go to ER for blood  transfusion , blood tests done today shows  low HGB( 6.2 )  result. Denies pain or discomfort , respirations unlabored /alert and oriented.

## 2014-09-12 NOTE — Assessment & Plan Note (Signed)
Will recheck her Hep C studies so we can try to get her treated.

## 2014-09-12 NOTE — Assessment & Plan Note (Signed)
Will continue her current ART. She gets flu shot. Will give her Rx for ensure but she will most likely have to pay out of pocket for this due to her BMI. Will see her back in 3 months.

## 2014-09-12 NOTE — Progress Notes (Signed)
   Subjective:    Patient ID: Nicole Mccormick, female    DOB: [birth year], 68 y.o.   MRN: [id number]  HPI 68 yo F with hx of HIV+ (on unboosted ATV/TRV). Was in hospital 3-31 to 4-2 with hypertensive emergency (BP > 200, headache, nausea). She had repeat adm 8-30 with chest pain. She has been seen by CV and felt to be more related to GERD.  Wants ensure, has lost 2#.  Has been taking BP at home, denies headache, CP, SOB.  Continues to have back pain.  Having cough and cold. Has shivers. Temp 100. Has been for last 2 week. Her feet and ankles have been swollen.  Wants Iron, has been anemic.   HIV 1 RNA Quant (copies/mL)  Date Value  03/01/2014 <20   12/29/2013 27*  06/28/2013 <20      CD4 T Cell Abs (/uL)  Date Value  08/07/2014 510   03/01/2014 570   12/29/2013 530     Review of Systems  Constitutional: Positive for unexpected weight change. Negative for appetite change.  Respiratory: Negative for shortness of breath.   Cardiovascular: Negative for chest pain.  Gastrointestinal: Negative for diarrhea and constipation.  Genitourinary: Negative for difficulty urinating.  Neurological: Negative for headaches.       Objective:   Physical Exam  Constitutional: She appears well-developed and well-nourished.  HENT:  Mouth/Throat: No oropharyngeal exudate.  Eyes: EOM are normal. Pupils are equal, round, and reactive to light.  Neck: Neck supple.  Cardiovascular: Normal rate and regular rhythm.   Murmur heard. Pulmonary/Chest: Effort normal and breath sounds normal.  Abdominal: Soft. Bowel sounds are normal. She exhibits no distension. There is no tenderness. There is no rebound.  Musculoskeletal: She exhibits edema.  Trace edema  Lymphadenopathy:    She has no cervical adenopathy.        Assessment & Plan:

## 2014-09-13 ENCOUNTER — Encounter (HOSPITAL_COMMUNITY): Payer: Self-pay | Admitting: Physician Assistant

## 2014-09-13 DIAGNOSIS — G2581 Restless legs syndrome: Secondary | ICD-10-CM | POA: Diagnosis present

## 2014-09-13 DIAGNOSIS — Z7982 Long term (current) use of aspirin: Secondary | ICD-10-CM | POA: Diagnosis not present

## 2014-09-13 DIAGNOSIS — F17210 Nicotine dependence, cigarettes, uncomplicated: Secondary | ICD-10-CM | POA: Diagnosis present

## 2014-09-13 DIAGNOSIS — K31811 Angiodysplasia of stomach and duodenum with bleeding: Secondary | ICD-10-CM | POA: Diagnosis present

## 2014-09-13 DIAGNOSIS — D649 Anemia, unspecified: Secondary | ICD-10-CM | POA: Diagnosis present

## 2014-09-13 DIAGNOSIS — M4690 Unspecified inflammatory spondylopathy, site unspecified: Secondary | ICD-10-CM | POA: Diagnosis present

## 2014-09-13 DIAGNOSIS — B20 Human immunodeficiency virus [HIV] disease: Secondary | ICD-10-CM | POA: Diagnosis present

## 2014-09-13 DIAGNOSIS — D509 Iron deficiency anemia, unspecified: Secondary | ICD-10-CM | POA: Diagnosis present

## 2014-09-13 DIAGNOSIS — B1920 Unspecified viral hepatitis C without hepatic coma: Secondary | ICD-10-CM | POA: Diagnosis present

## 2014-09-13 DIAGNOSIS — K219 Gastro-esophageal reflux disease without esophagitis: Secondary | ICD-10-CM | POA: Diagnosis present

## 2014-09-13 DIAGNOSIS — I2510 Atherosclerotic heart disease of native coronary artery without angina pectoris: Secondary | ICD-10-CM | POA: Diagnosis present

## 2014-09-13 DIAGNOSIS — I252 Old myocardial infarction: Secondary | ICD-10-CM | POA: Diagnosis not present

## 2014-09-13 DIAGNOSIS — Z9114 Patient's other noncompliance with medication regimen: Secondary | ICD-10-CM | POA: Diagnosis present

## 2014-09-13 DIAGNOSIS — I10 Essential (primary) hypertension: Secondary | ICD-10-CM | POA: Diagnosis present

## 2014-09-13 DIAGNOSIS — D62 Acute posthemorrhagic anemia: Secondary | ICD-10-CM | POA: Diagnosis present

## 2014-09-13 DIAGNOSIS — Z79899 Other long term (current) drug therapy: Secondary | ICD-10-CM | POA: Diagnosis not present

## 2014-09-13 DIAGNOSIS — R634 Abnormal weight loss: Secondary | ICD-10-CM | POA: Diagnosis present

## 2014-09-13 LAB — MRSA PCR SCREENING: MRSA by PCR: NEGATIVE

## 2014-09-13 LAB — IRON AND TIBC
Iron: 314 ug/dL — ABNORMAL HIGH (ref 42–135)
Saturation Ratios: 61 % — ABNORMAL HIGH (ref 20–55)
TIBC: 518 ug/dL — ABNORMAL HIGH (ref 250–470)
UIBC: 204 ug/dL (ref 125–400)

## 2014-09-13 LAB — LACTATE DEHYDROGENASE: LDH: 293 U/L — ABNORMAL HIGH (ref 94–250)

## 2014-09-13 LAB — ANA: Anti Nuclear Antibody(ANA): NEGATIVE

## 2014-09-13 LAB — CBC
HCT: 23.8 % — ABNORMAL LOW (ref 36.0–46.0)
HCT: 27.8 % — ABNORMAL LOW (ref 36.0–46.0)
Hemoglobin: 7 g/dL — ABNORMAL LOW (ref 12.0–15.0)
Hemoglobin: 8.2 g/dL — ABNORMAL LOW (ref 12.0–15.0)
MCH: 22.7 pg — ABNORMAL LOW (ref 26.0–34.0)
MCH: 22.7 pg — ABNORMAL LOW (ref 26.0–34.0)
MCHC: 29.4 g/dL — ABNORMAL LOW (ref 30.0–36.0)
MCHC: 29.5 g/dL — ABNORMAL LOW (ref 30.0–36.0)
MCV: 77.3 fL — ABNORMAL LOW (ref 78.0–100.0)
MCV: 76.8 fL — ABNORMAL LOW (ref 78.0–100.0)
Platelets: 230 10*3/uL (ref 150–400)
Platelets: 242 10*3/uL (ref 150–400)
RBC: 3.08 MIL/uL — ABNORMAL LOW (ref 3.87–5.11)
RBC: 3.62 MIL/uL — ABNORMAL LOW (ref 3.87–5.11)
RDW: 19.6 % — ABNORMAL HIGH (ref 11.5–15.5)
RDW: 19.3 % — ABNORMAL HIGH (ref 11.5–15.5)
WBC: 3.8 10*3/uL — ABNORMAL LOW (ref 4.0–10.5)
WBC: 4.6 10*3/uL (ref 4.0–10.5)

## 2014-09-13 LAB — HIV-1 RNA QUANT-NO REFLEX-BLD
HIV 1 RNA Quant: <20 copies/mL (ref <20)
HIV-1 RNA Quant, Log: <1.3 {Log} (ref <1.30)

## 2014-09-13 LAB — SAVE SMEAR

## 2014-09-13 LAB — BASIC METABOLIC PANEL
Anion gap: 10 (ref 5–15)
BUN: 19 mg/dL (ref 6–23)
CO2: 25 mEq/L (ref 19–32)
Calcium: 8.5 mg/dL (ref 8.4–10.5)
Chloride: 106 mEq/L (ref 96–112)
Creatinine, Ser: 0.84 mg/dL (ref 0.50–1.10)
GFR calc Af Amer: 80 mL/min — ABNORMAL LOW (ref >90)
GFR calc non Af Amer: 69 mL/min — ABNORMAL LOW (ref >90)
Glucose, Bld: 100 mg/dL — ABNORMAL HIGH (ref 70–99)
Potassium: 4 mEq/L (ref 3.7–5.3)
Sodium: 141 mEq/L (ref 137–147)

## 2014-09-13 LAB — RETICULOCYTES
RBC.: 2.7 MIL/uL — ABNORMAL LOW (ref 3.87–5.11)
Retic Count, Absolute: 56.7 10*3/uL (ref 19.0–186.0)
Retic Ct Pct: 2.1 % (ref 0.4–3.1)

## 2014-09-13 LAB — VITAMIN B12: Vitamin B-12: 905 pg/mL (ref 211–911)

## 2014-09-13 LAB — FOLATE: Folate: 13.6 ng/mL

## 2014-09-13 LAB — URINALYSIS, ROUTINE W REFLEX MICROSCOPIC
Bilirubin Urine: NEGATIVE
Glucose, UA: NEGATIVE mg/dL
Hgb urine dipstick: NEGATIVE
Ketones, ur: NEGATIVE mg/dL
Leukocytes, UA: NEGATIVE
Nitrite: NEGATIVE
Protein, ur: NEGATIVE mg/dL
Specific Gravity, Urine: 1.015 (ref 1.005–1.030)
Urobilinogen, UA: 1 mg/dL (ref 0.0–1.0)
pH: 7.5 (ref 5.0–8.0)

## 2014-09-13 LAB — T-HELPER CELL (CD4) - (RCID CLINIC ONLY)
CD4 % Helper T Cell: 26 % — ABNORMAL LOW (ref 33–55)
CD4 T Cell Abs: 620 /uL (ref 400–2700)

## 2014-09-13 LAB — OCCULT BLOOD, POC DEVICE: Fecal Occult Bld: NEGATIVE

## 2014-09-13 LAB — TECHNOLOGIST SMEAR REVIEW

## 2014-09-13 LAB — FERRITIN: Ferritin: 1 ng/mL — ABNORMAL LOW (ref 10–291)

## 2014-09-13 MED ORDER — PANTOPRAZOLE SODIUM 40 MG IV SOLR
40.0000 mg | Freq: Once | INTRAVENOUS | Status: AC
  Administered 2014-09-13: 40 mg via INTRAVENOUS
  Filled 2014-09-13 (×2): qty 40

## 2014-09-13 MED ORDER — HYDRALAZINE HCL 10 MG PO TABS
10.0000 mg | ORAL_TABLET | Freq: Once | ORAL | Status: AC
  Administered 2014-09-13: 10 mg via ORAL
  Filled 2014-09-13: qty 1

## 2014-09-13 MED ORDER — SODIUM CHLORIDE 0.9 % IV SOLN
8.0000 mg/h | INTRAVENOUS | Status: DC
  Filled 2014-09-13: qty 80

## 2014-09-13 MED ORDER — SODIUM CHLORIDE 0.9 % IV SOLN
80.0000 mg | Freq: Once | INTRAVENOUS | Status: DC
  Filled 2014-09-13: qty 80

## 2014-09-13 MED ORDER — ASPIRIN EC 81 MG PO TBEC
81.0000 mg | DELAYED_RELEASE_TABLET | Freq: Every day | ORAL | Status: DC
  Administered 2014-09-13: 81 mg via ORAL
  Filled 2014-09-13 (×2): qty 1

## 2014-09-13 MED ORDER — PEG 3350-KCL-NA BICARB-NACL 420 G PO SOLR
4000.0000 mL | Freq: Once | ORAL | Status: AC
  Administered 2014-09-13: 4000 mL via ORAL
  Filled 2014-09-13: qty 4000

## 2014-09-13 MED ORDER — BACLOFEN 20 MG PO TABS
20.0000 mg | ORAL_TABLET | Freq: Every day | ORAL | Status: DC | PRN
  Administered 2014-09-13 – 2014-09-15 (×2): 20 mg via ORAL
  Filled 2014-09-13 (×2): qty 1

## 2014-09-13 MED ORDER — HYDRALAZINE HCL 10 MG PO TABS: 10.0000 mg | ORAL_TABLET | Freq: Three times a day (TID) | ORAL | Status: DC

## 2014-09-13 MED ORDER — SODIUM CHLORIDE 0.9 % IV SOLN: INTRAVENOUS | Status: DC

## 2014-09-13 MED ORDER — BISACODYL 5 MG PO TBEC
10.0000 mg | DELAYED_RELEASE_TABLET | Freq: Once | ORAL | Status: AC
  Administered 2014-09-13: 10 mg via ORAL
  Filled 2014-09-13: qty 2

## 2014-09-13 MED ORDER — HYDROCHLOROTHIAZIDE 12.5 MG PO CAPS
12.5000 mg | ORAL_CAPSULE | Freq: Every day | ORAL | Status: DC
  Administered 2014-09-13: 12.5 mg via ORAL
  Filled 2014-09-13 (×2): qty 1

## 2014-09-13 MED ORDER — ATAZANAVIR SULFATE 200 MG PO CAPS
400.0000 mg | ORAL_CAPSULE | Freq: Every day | ORAL | Status: DC
  Filled 2014-09-13 (×2): qty 2

## 2014-09-13 MED ORDER — SUCRALFATE 1 G PO TABS
1.0000 g | ORAL_TABLET | Freq: Four times a day (QID) | ORAL | Status: DC
  Administered 2014-09-13 (×4): 1 g via ORAL
  Filled 2014-09-13 (×8): qty 1

## 2014-09-13 MED ORDER — ATAZANAVIR SULFATE 200 MG PO CAPS
200.0000 mg | ORAL_CAPSULE | Freq: Every day | ORAL | Status: DC
  Administered 2014-09-13: 200 mg via ORAL
  Filled 2014-09-13 (×2): qty 1

## 2014-09-13 MED ORDER — HYDRALAZINE HCL 10 MG PO TABS
10.0000 mg | ORAL_TABLET | Freq: Three times a day (TID) | ORAL | Status: DC
  Administered 2014-09-13 – 2014-09-14 (×5): 10 mg via ORAL
  Filled 2014-09-13 (×7): qty 1

## 2014-09-13 MED ORDER — GABAPENTIN 600 MG PO TABS
600.0000 mg | ORAL_TABLET | Freq: Two times a day (BID) | ORAL | Status: DC
  Administered 2014-09-13 (×2): 600 mg via ORAL
  Filled 2014-09-13 (×4): qty 1

## 2014-09-13 MED ORDER — SODIUM CHLORIDE 0.9 % IJ SOLN
3.0000 mL | Freq: Two times a day (BID) | INTRAMUSCULAR | Status: DC
  Administered 2014-09-13: 09:00:00 via INTRAVENOUS
  Administered 2014-09-13 – 2014-09-14 (×4): 3 mL via INTRAVENOUS

## 2014-09-13 MED ORDER — FERROUS SULFATE 325 (65 FE) MG PO TABS
325.0000 mg | ORAL_TABLET | Freq: Three times a day (TID) | ORAL | Status: DC
  Administered 2014-09-13 (×3): 325 mg via ORAL
  Filled 2014-09-13 (×7): qty 1

## 2014-09-13 MED ORDER — LISINOPRIL 20 MG PO TABS
20.0000 mg | ORAL_TABLET | Freq: Every day | ORAL | Status: DC
  Administered 2014-09-13: 20 mg via ORAL
  Filled 2014-09-13 (×2): qty 1

## 2014-09-13 MED ORDER — SODIUM CHLORIDE 0.9 % IV SOLN
INTRAVENOUS | Status: DC
  Administered 2014-09-13: 20:00:00 via INTRAVENOUS

## 2014-09-13 MED ORDER — ACYCLOVIR 400 MG PO TABS: 400.0000 mg | ORAL_TABLET | Freq: Every day | ORAL | Status: DC | PRN

## 2014-09-13 MED ORDER — EMTRICITABINE-TENOFOVIR DF 200-300 MG PO TABS
1.0000 | ORAL_TABLET | Freq: Every day | ORAL | Status: DC
  Administered 2014-09-13: 1 via ORAL
  Filled 2014-09-13 (×3): qty 1

## 2014-09-13 MED ORDER — AMLODIPINE BESYLATE 10 MG PO TABS
10.0000 mg | ORAL_TABLET | Freq: Every day | ORAL | Status: DC
  Administered 2014-09-13: 10 mg via ORAL
  Filled 2014-09-13 (×2): qty 1

## 2014-09-13 MED ORDER — ENSURE COMPLETE PO LIQD
1.0000 | Freq: Two times a day (BID) | ORAL | Status: DC
  Administered 2014-09-13: 237 mL via ORAL

## 2014-09-13 MED ORDER — LISINOPRIL-HYDROCHLOROTHIAZIDE 20-12.5 MG PO TABS: 2.0000 | ORAL_TABLET | Freq: Every day | ORAL | Status: DC

## 2014-09-13 MED ORDER — ISOSORBIDE MONONITRATE ER 30 MG PO TB24
30.0000 mg | ORAL_TABLET | Freq: Every day | ORAL | Status: DC
  Administered 2014-09-13: 30 mg via ORAL
  Filled 2014-09-13 (×2): qty 1

## 2014-09-13 MED ORDER — PANTOPRAZOLE SODIUM 40 MG IV SOLR
40.0000 mg | Freq: Two times a day (BID) | INTRAVENOUS | Status: DC
  Filled 2014-09-13 (×2): qty 40

## 2014-09-13 NOTE — Progress Notes (Signed)
Subjective: Nicole Mccormick was seen by the team this morning. She mentions that she has not felt symptomatic and denies shortness of breath, dizziness, fatigue, and has noticed any blood in her stool. She also denies any pain this morning. She reports that she has not been consistently taking her oral iron supplementation because she experiences nausea and abdominal discomfort. She denies any melena, hematochezia, or any source of bleeding that she can identify.   Objective: Vital signs in last 24 hours: Filed Vitals:   09/13/14 0914 09/13/14 1000 09/13/14 1155 09/13/14 1315  BP: 229/54 196/96 169/59 180/95  Pulse:      Temp:   99.1 F (37.3 C)   TempSrc:   Oral   Resp:  18 16   Height:      Weight:      SpO2:   97%    Weight change:   Intake/Output Summary (Last 24 hours) at 09/13/14 1517 Last data filed at 09/13/14 1000  Gross per 24 hour  Intake  915.5 ml  Output    300 ml  Net  615.5 ml   Lab Results: Results for Nicole Mccormick, Nicole Mccormick (MRN [id number]) as of 09/13/2014 15:17  Ref. Range 09/13/2014 05:48 09/13/2014 11:43 09/13/2014 14:07  Sodium Latest Range: 137-147 mEq/L 141    Potassium Latest Range: 3.7-5.3 mEq/L 4.0    Chloride Latest Range: 96-112 mEq/L 106    CO2 Latest Range: 19-32 mEq/L 25    BUN Latest Range: 6-23 mg/dL 19    Creatinine Latest Range: 0.50-1.10 mg/dL 0.84    Calcium Latest Range: 8.4-10.5 mg/dL 8.5    GFR calc non Af Amer Latest Range: >90 mL/min 69 (L)    GFR calc Af Amer Latest Range: >90 mL/min 80 (L)    Glucose Latest Range: 70-99 mg/dL 100 (H)    Anion gap Latest Range: 5-15  10    WBC Latest Range: 4.0-10.5 K/uL 3.8 (L)  4.6  RBC Latest Range: 3.87-5.11 MIL/uL 3.08 (L)  3.62 (L)  Hemoglobin Latest Range: 12.0-15.0 g/dL 7.0 (L)  8.2 (L)  HCT Latest Range: 36.0-46.0 % 23.8 (L)  27.8 (L)  MCV Latest Range: 78.0-100.0 fL 77.3 (L)  76.8 (L)  MCH Latest Range: 26.0-34.0 pg 22.7 (L)  22.7 (L)  MCHC Latest Range: 30.0-36.0 g/dL 29.4 (L)  29.5 (L)  RDW Latest  Range: 11.5-15.5 % 19.6 (H)  19.3 (H)  Platelets Latest Range: 150-400 K/uL 230  242   Micro Results: Recent Results (from the past 240 hour(s))  MRSA PCR SCREENING     Status: None   Collection Time    09/13/14  2:07 AM      Result Value Ref Range Status   MRSA by PCR NEGATIVE  NEGATIVE Final   Comment:            The GeneXpert MRSA Assay (FDA     approved for NASAL specimens     only), is one component of a     comprehensive MRSA colonization     surveillance program. It is not     intended to diagnose MRSA     infection nor to guide or     monitor treatment for     MRSA infections.   Studies/Results: Dg Chest Port 1 View  09/13/2014   CLINICAL DATA:  Acute onset of diffuse chest pain and shortness of breath. Initial encounter.  EXAM: PORTABLE CHEST - 1 VIEW  COMPARISON:  Chest radiograph performed 08/07/2014  FINDINGS: The lungs are well-aerated and  clear. There is no evidence of focal opacification, pleural effusion or pneumothorax.  The cardiomediastinal silhouette is mildly enlarged. No acute osseous abnormalities are seen.  IMPRESSION: Mild cardiomegaly; no acute cardiopulmonary process seen.   Electronically Signed   By: Garald Balding M.D.   On: 09/13/2014 00:02   Medications:  Scheduled Meds: . amLODipine  10 mg Oral Daily  . aspirin EC  81 mg Oral Daily  . [START ON 09/14/2014] atazanavir  400 mg Oral Q breakfast  . emtricitabine-tenofovir  1 tablet Oral Q breakfast  . feeding supplement (ENSURE COMPLETE)  1 Container Oral BID  . ferrous sulfate  325 mg Oral TID WC  . gabapentin  600 mg Oral BID  . hydrALAZINE  10 mg Oral 3 times per day  . hydrochlorothiazide  12.5 mg Oral Daily  . isosorbide mononitrate  30 mg Oral Daily  . lisinopril  20 mg Oral Daily  . [START ON 09/14/2014] pantoprazole (PROTONIX) IV  40 mg Intravenous Q12H  . sodium chloride  3 mL Intravenous Q12H  . sucralfate  1 g Oral QID   Continuous Infusions:  PRN  Meds:.baclofen  Assessment/Plan: Principal Problem:   Iron deficiency anemia Active Problems:   Human immunodeficiency virus (HIV) disease   TOBACCO ABUSE   Gastroesophageal reflux disease without esophagitis   Essential hypertension, benign   CAD (coronary artery disease)   Hepatitis C  Nicole Mccormick is a 67 year old female with a history of HIV (VL undetectable, CD4 of 620 on 10/05), iron deficiency anemia, HCV, and GERD who presented with anemia with a Hgb of 5.2.   #Acute on chronic iron deficiency anemia: Currently asymptomatic with ferritin of 1, iron of 13, and TIBC of 538 (1 mo ago).  Hgb yesterday at clinic was 5.7. As per lab results, hemoglobin was 13.5 in 4/15 to 7.2 in 08/31. FOBT was negative. Received 2 units of pRBCs overnight and post-transfusion Hgb was 7.0 at 0548 and 8.2 at 1407. She presented for similar presentation in 2012 and received a GI workup by Dr. Carlean Purl, which revealed a benign adenomatous polyp and anal condyloma. After that admission, patient recalls taking her iron supplementation on a regular basis; however, recently she has not been due to nausea and and takes it as needed for fatigue. She reports taking 4-5 tabs of Bayer Aspirin as well as BC powder for her back pain recently. Differential includes peptic ulcer disease given her recent extensive aspirin use and occasional abdominal pain, colonic diverticula,  polyp, or even malignancy given her modest weight loss of 10 pounds (which she attributes to decreased appetite). Other causes to consider is bone marrow suppression secondary to liver disease given her elevated LFTs, evidence of target cells on smear, and HCV (VL in 2009 was high and patient has not been on any medications; current labs pending) and less likely from HIV given its CD4 count of 620.  Hemolytic anemia is also considered given her elevated LDH but cannot obtain further labs given that she has received transfusion. ANA, B12, and folate are all normal.  Given the nature of her decline of her hemoglobin, it is suspicious that there is a chronic source of bleeding that needs to be identified.  Dr. Paulita Fujita of GI has been consulted and will pursue with GI workup of endoscopy and colonoscopy to identify any GI source that is contributing to her acute anemia.  --trend Hgb q12h  --if Hgb < 7, consider transfusion   --appreciated Dr. Erlinda Hong involvement,  endoscopy and colonscopy planned for tomorrow; if unrevealing, consider capsule endoscopy --if capsule endoscopy is negative, consider hematologic investigation  --NPO midnight  --will need to restart taking oral iron supplementation on discharge   #HIV: well-controlled, last CD4 count on 10/05 was 620.  --continue home atazanavir and Truvada   #GERD  On famotidine at home and sucralfate.  --Protonix as above; appreciate pharmacy's recommendation that use of PPI might decrease effectiveness of patient's atazanavir  -Continue home sucralfate.   #Hypertension : Blood pressure elevated in ER and continues to be [id number] systolic during admission. Patient reports compliance to her antihypertensives at home.  --Continue home amlodipine, Imdur, and Prinzide.  --Continue hydrazine 10 mg q8h PO --If BP >200/100, consider giving hydralazine 2 mg IV  --will follow up with Dr. Sherrine Maples in clinic to manage her antihypertensives on discharge   #CAD  Reports history of MI without stenting or CABG. Echo 03/09/14 showed EF of 65-70% with grade 1 DD and mild mitral regurgitation.  --Continue home aspirin.   #HCV  Antibody positive with elevated LFTs. Dr. Johnnye Sima re-ordered HCV tests yesterday and plans to refer her for treatment.  [ ]  Hep C genotype [ ]  Hep C RNA load [ ]  Follow up HCV tests drawn yesterday.   #Chronic back pain: This is likely secondary to arthiritis given that her CT chest from 2012 showed thoracic kyphosis and scoliosis with mild anterior wedging mid thoracic vertebra without bony  destructive lesion.  --Continue home Baclofen and gabapentin  --Discouraged the use of Bayer aspirin or BC Powder --Tylenol 500mg  q6h PRN for pain  --consider a different pain regimen on discharge   VTE prophylaxis: SCDs   This is a Careers information officer Note.  The care of the patient was discussed with Dr. Megan Salon and Dr. Hayes Ludwig and the assessment and plan formulated with their assistance.  Please see their attached note for official documentation of the daily encounter.   LOS: 1 day   Josiah Lobo, Med Student 09/13/2014, 3:17 PM

## 2014-09-13 NOTE — Progress Notes (Signed)
Patient ID: Nicole Mccormick, female   DOB: 12-20-1945, 68 y.o.   MRN: [id number]        Attending progress note    Date of Admission:  09/12/2014     Principal Problem:   Iron deficiency anemia Active Problems:   Human immunodeficiency virus (HIV) disease   TOBACCO ABUSE   Gastroesophageal reflux disease without esophagitis   Essential hypertension, benign   CAD (coronary artery disease)   Hepatitis C   . amLODipine  10 mg Oral Daily  . aspirin EC  81 mg Oral Daily  . [START ON 09/14/2014] atazanavir  400 mg Oral Q breakfast  . emtricitabine-tenofovir  1 tablet Oral Q breakfast  . feeding supplement (ENSURE COMPLETE)  1 Container Oral BID  . ferrous sulfate  325 mg Oral TID WC  . gabapentin  600 mg Oral BID  . hydrALAZINE  10 mg Oral 3 times per day  . hydrochlorothiazide  12.5 mg Oral Daily  . isosorbide mononitrate  30 mg Oral Daily  . lisinopril  20 mg Oral Daily  . [START ON 09/14/2014] pantoprazole (PROTONIX) IV  40 mg Intravenous Q12H  . sodium chloride  3 mL Intravenous Q12H  . sucralfate  1 g Oral QID    I have seen and examined [name] today and discussed her case with our team. She has chronic iron deficiency anemia presumed to be do to occult GI blood loss. Workup in 2012 revealed only a benign adenomatous polyp and anal condyloma. After that admission she recalls taking her iron supplements on a fairly regular basis. However she states that she was never told she needed to take it every day. She also had some problems with intermittent nausea related to the iron supplements and she states that at some point she stopped taking it. She's also been taking aspirin, 5-6 daily, recently for arthritis pain. Her hemoglobin was noted to be normal in March but has drifted down since August. She is unaware of any recent blood in her stool. I appreciate Dr. Erlinda Hong GI evaluation and recommendations for repeat endoscopy. We will talk to her about options for iron supplementation and  alternative pain meds to aspirin.  Michel Bickers, MD [facility] [phone] pager   [phone] cell 09/13/2014, 1:32 PM

## 2014-09-13 NOTE — Progress Notes (Signed)
  I have seen and examined the patient, and reviewed the daily progress note by Josiah Lobo, MS 4 and discussed the care of the patient with them. Please see my progress note from 09/13/2014 for further details regarding assessment and plan.    Signed:  [name], MD 09/13/2014, 5:14 PM

## 2014-09-13 NOTE — Progress Notes (Signed)
Subjective: No melena or hematochezia overnight. She denies seeing blood in her stools recently. She was taking iron tablets months ago but had nausea with this medication so she stopped taking it.   Objective: Vital signs in last 24 hours: Filed Vitals:   09/13/14 0914 09/13/14 1000 09/13/14 1155 09/13/14 1315  BP: 229/54 196/96 169/59 180/95  Pulse:      Temp:   99.1 F (37.3 C)   TempSrc:   Oral   Resp:  18 16   Height:      Weight:      SpO2:   97%    Weight change:   Intake/Output Summary (Last 24 hours) at 09/13/14 1404 Last data filed at 09/13/14 1000  Gross per 24 hour  Intake  915.5 ml  Output    300 ml  Net  615.5 ml   Vitals reviewed. General: resting in bed, in NAD HEENT no scleral icterus Cardiac: RRR, no rubs, 2/6 SEM murmur, no gallops Pulm: clear to auscultation bilaterally,no rales, faint expiratory wheezes through all lung fields Abd: soft, nontender, nondistended, BS present Ext: warm and well perfused, no pedal edema Neuro: alert and oriented X3, moves all extremities voluntairly  Lab Results: Basic Metabolic Panel:  Recent Labs Lab 09/12/14 2229 09/13/14 0548  NA 141 141  K 3.7 4.0  CL 105 106  CO2 25 25  GLUCOSE 112* 100*  BUN 19 19  CREATININE 0.91 0.84  CALCIUM 8.6 8.5   Liver Function Tests:  Recent Labs Lab 09/12/14 1101 09/12/14 2229  AST 84* 81*  ALT 79* 72*  ALKPHOS 76 79  BILITOT 0.7 0.5  PROT 7.5 7.6  ALBUMIN 3.8 3.2*   CBC:  Recent Labs Lab 09/12/14 1101 09/12/14 2229 09/13/14 0548  WBC 4.7 4.6 3.8*  NEUTROABS 2.6 2.3  --   HGB 6.2* 5.7* 7.0*  HCT 22.4* 20.3* 23.8*  MCV 73.9* 73.8* 77.3*  PLT 305 276 230   Coagulation:  Recent Labs Lab 09/12/14 1101 09/12/14 2306  LABPROT 14.0 14.8  INR 1.08 1.16   Anemia Panel:  Recent Labs Lab 09/13/14 0002  VITAMINB12 905  FOLATE 13.6  FERRITIN 1*  TIBC 518*  IRON 314*  RETICCTPCT 2.1   Urine Drug Screen: Drugs of Abuse     Component Value  Date/Time   LABOPIA POSITIVE* 03/09/2014 1600   LABOPIA NEGATIVE 12/26/2010 0137   COCAINSCRNUR NONE DETECTED 03/09/2014 1600   COCAINSCRNUR NEGATIVE 12/26/2010 0137   LABBENZ NONE DETECTED 03/09/2014 1600   LABBENZ NEGATIVE 12/26/2010 0137   AMPHETMU NONE DETECTED 03/09/2014 1600   AMPHETMU NEGATIVE 12/26/2010 0137   THCU NONE DETECTED 03/09/2014 1600   LABBARB NONE DETECTED 03/09/2014 1600    Urinalysis:  Recent Labs Lab 09/13/14 0307  COLORURINE YELLOW  LABSPEC 1.015  PHURINE 7.5  GLUCOSEU NEGATIVE  HGBUR NEGATIVE  BILIRUBINUR NEGATIVE  KETONESUR NEGATIVE  PROTEINUR NEGATIVE  UROBILINOGEN 1.0  NITRITE NEGATIVE  LEUKOCYTESUR NEGATIVE    Micro Results: Recent Results (from the past 240 hour(s))  MRSA PCR SCREENING     Status: None   Collection Time    09/13/14  2:07 AM      Result Value Ref Range Status   MRSA by PCR NEGATIVE  NEGATIVE Final   Comment:            The GeneXpert MRSA Assay (FDA     approved for NASAL specimens     only), is one component of a     comprehensive MRSA  colonization     surveillance program. It is not     intended to diagnose MRSA     infection nor to guide or     monitor treatment for     MRSA infections.   Studies/Results: Dg Chest Port 1 View  09/13/2014   CLINICAL DATA:  Acute onset of diffuse chest pain and shortness of breath. Initial encounter.  EXAM: PORTABLE CHEST - 1 VIEW  COMPARISON:  Chest radiograph performed 08/07/2014  FINDINGS: The lungs are well-aerated and clear. There is no evidence of focal opacification, pleural effusion or pneumothorax.  The cardiomediastinal silhouette is mildly enlarged. No acute osseous abnormalities are seen.  IMPRESSION: Mild cardiomegaly; no acute cardiopulmonary process seen.   Electronically Signed   By: Garald Balding M.D.   On: 09/13/2014 00:02   Medications: I have reviewed the patient's current medications. Scheduled Meds: . amLODipine  10 mg Oral Daily  . aspirin EC  81 mg Oral Daily  . [START ON  09/14/2014] atazanavir  400 mg Oral Q breakfast  . emtricitabine-tenofovir  1 tablet Oral Q breakfast  . feeding supplement (ENSURE COMPLETE)  1 Container Oral BID  . ferrous sulfate  325 mg Oral TID WC  . gabapentin  600 mg Oral BID  . hydrALAZINE  10 mg Oral 3 times per day  . hydrochlorothiazide  12.5 mg Oral Daily  . isosorbide mononitrate  30 mg Oral Daily  . lisinopril  20 mg Oral Daily  . [START ON 09/14/2014] pantoprazole (PROTONIX) IV  40 mg Intravenous Q12H  . sodium chloride  3 mL Intravenous Q12H  . sucralfate  1 g Oral QID   Continuous Infusions:  PRN Meds:.baclofen Assessment/Plan: 68 year old woman with PMH of HIV disease (well controlled), CAD, HTN, GERD, HCV, presenting with anemia with Hgb of 5.7.   Iron deficiency anemia: She has hx of iron deficiency anemia, was on iron supplementation in the past but stopped taking this due to nausea. Her Hgb has been stable in the past but has steadily declined since August this year. Her iron panel here reveals iron low at 13, ferritin of 1. She is s/p transfusion of 2pRBC with Hgb of 7.0 and no s/s of bleeding. She has no loss of appetite, weight loss, or unintentional weight loss, FOBT is negative. GI is following, and plan to repeat colonoscopy and endoscopy (last colonoscopy in 2012 by Dr. Carlean Purl with no source of bleeding). She notes that she has been taking 5-6 aspirin per day recently for arthritis pain but denies epigastric pain or hematemesis.  -Repeat CBC this pm, transfuse if Hgb<7.0. CBC in am -Follow up in GI recommendations and studies tomorrow -Continue PPI BID for now -Per GI: Clear liquid diet for now -ferrous sulfate TID ac  HIV: well controlled with last CD4 count of 510 in 07/11/14 with VL undetectable in 03/01/14. -Continue home atazavanir (though this may need to be changed if she will continue taking PPI) -Continue home Truvada  CAD: Stable. Has hx of MI but no CABG. Continue home ASA for now--pending further GI  eval.   HTN: BP elevated this am to 190s/100s but improved after her anti-hypertensive were given. Pt was asymptomatic. She in on amlodipine 10mg  daily, HCTZ 12.5mg  daily, imdur 30mg  daily, lisinopril 20mg  daily which were continued.  -Started hydralazine 10mg  TID  GERD: Continue home sucralfate, started protonix BID.   Chronic back pain: Thought to be secondary to arthritis with CT chest from 2012 showing thoracic kyphosis and  scoliosis with mild anterior wedging mid thoracic vertebra without bony destructive lesion. -Continue home Baclofen and gabapentin -Discouraged continued use of aspirin and NSAIDs for arthritis pain -Pt may use Tylenol 500mg  q6h PRN for pain  VTE prophylaxis: SCDs  FEN NSL Replete as needed Clear liquid diet   Dispo: Disposition is deferred at this time, awaiting improvement of current medical problems.  Anticipated discharge in approximately 1-2 day(s).   The patient does have a current PCP Drucilla Schmidt, MD) and does need an [facility] hospital follow-up appointment after discharge.  The patient does not have transportation limitations that hinder transportation to clinic appointments.  .Services Needed at time of discharge: Y = Yes, Blank = No PT:   OT:   RN:   Equipment:   Other:     LOS: 1 day   [name], MD 09/13/2014, 2:04 PM

## 2014-09-13 NOTE — ED Provider Notes (Signed)
Patient presents after routine visit with PCP and showing a hgb of 5.7.  She recently stopped taking iron as rx.  She is asymptomatic.  Blood transfusion started in the ED, will admit for continued care.  Medical screening examination/treatment/procedure(s) were conducted as a shared visit with non-physician practitioner(s) and myself.  I personally evaluated the patient during the encounter.   EKG Interpretation   Date/Time:  Monday September 12 2014 23:13:47 EDT Ventricular Rate:  59 PR Interval:  168 QRS Duration: 95 QT Interval:  458 QTC Calculation: 825 R Axis:   -19 Text Interpretation:  Sinus rhythm Borderline left axis deviation Anterior  infarct, old No significant change since last tracing Confirmed by Glynn Octave [id number]) on 09/12/2014 11:21:28 PM        Everlene Balls, MD 09/13/14 [id number]

## 2014-09-13 NOTE — Progress Notes (Signed)
Dr. Trudee Kuster notified of the pt's BP still in the 200's. Additional Hydralazine 10mg  PO ordered.

## 2014-09-13 NOTE — Consult Note (Signed)
[facility] Gastroenterology Consultation Note  Referring Provider:  Dr. Michel Bickers Primary Care Physician:  Drucilla Schmidt, MD  Reason for Consultation:  Iron-deficiency anemia  HPI: Nicole Mccormick is a 68 y.o. female admitted directly to hospital after routine labs from her recent ID appointment showed significance anemia, Hgb ~ 5 with iron-deficiency indices.  She has very rare upper abdominal pain, can occur at times after eating, other times without eating.  No nausea, vomiting, dysphagia, change in bowel habits, overt blood in stool (was found, however, to have hemoccult-positive stools), loss-of-appetite, unintentional weight loss.  Had endoscopy and colonoscopy in November 2012 by Dr. Carlean Purl for similar reason, with no obvious source of her anemia identified.  Patient does take aspirin preparations from time-to-time.   Past Medical History  Diagnosis Date  . Hypertension   . Abnormal Pap smear   . Asthma   . Coronary artery disease   . HIV infection     diagnosed before 2008  . Varicosities   . Seizures     last sz fri nov 2  . GERD (gastroesophageal reflux disease)     previously on aciphex, discontinued november 2012  because patient asymptomatic, and concern about interference with HIV meds.  May try pepcid in the future if symptoms return  . Iron deficiency anemia     Ferritin = 2 in november 2012, started on iron supplemenation  . Arthritis   . CHF (congestive heart failure)   . Heart murmur   . Hepatitis C antibody test positive     Past Surgical History  Procedure Laterality Date  . Abdominal hysterectomy    . Esophagogastroduodenoscopy  10/13/11    small hiatal hernia  . Colonoscopy  10/13/11    small adenoma, anal condyloma  . Colonoscopy  10/13/2011    Procedure: COLONOSCOPY;  Surgeon: Gatha Mayer, MD;  Location: [place];  Service: Endoscopy;  Laterality: N/A;  . Esophagogastroduodenoscopy  10/13/2011    Procedure: ESOPHAGOGASTRODUODENOSCOPY (EGD);  Surgeon:  Gatha Mayer, MD;  Location: [facility] ENDOSCOPY;  Service: Endoscopy;  Laterality: N/A;    Prior to Admission medications   Medication Sig Start Date End Date Taking? Authorizing Provider  acyclovir (ZOVIRAX) 400 MG tablet Take 400 mg by mouth daily as needed (for flare ups).  12/29/13  Yes Historical Provider, MD  amLODipine (NORVASC) 10 MG tablet Take 1 tablet (10 mg total) by mouth daily. 03/10/14  Yes Lesly Dukes, MD  aspirin EC 81 MG tablet Take 1 tablet (81 mg total) by mouth daily. 02/26/13  Yes [name], MD  Aspirin-Caffeine (BAYER BACK & BODY PAIN EX ST PO) Take 1-2 tablets by mouth daily as needed (pain).   Yes Historical Provider, MD  atazanavir (REYATAZ) 200 MG capsule Take 200 mg by mouth daily with breakfast. **SPACED 12 HOURS FROM ANTACIDS** 02/28/14  Yes Carlyle Basques, MD  baclofen (LIORESAL) 20 MG tablet Take 20 mg by mouth daily as needed for muscle spasms.  03/04/14  Yes [name], MD  Calcium Carbonate Antacid (ANTACID PO) Take 1 tablet by mouth 2 (two) times daily as needed (acid reflux).   Yes Historical Provider, MD  emtricitabine-tenofovir (TRUVADA) 200-300 MG per tablet Take 1 tablet by mouth daily. 02/28/14  Yes Carlyle Basques, MD  famotidine (PEPCID) 40 MG tablet Take 1 tablet (40 mg total) by mouth daily. 08/08/14  Yes Tasrif Ahmed, MD  ferrous sulfate 325 (65 FE) MG tablet Take 325 mg by mouth 3 (three) times daily with meals.  Yes Historical Provider, MD  gabapentin (NEURONTIN) 600 MG tablet Take 1 tablet (600 mg total) by mouth 3 (three) times daily. 03/04/14  Yes [name], MD  gabapentin (NEURONTIN) 600 MG tablet Take 600 mg by mouth See admin instructions. Take 600mg  every morning and 600mg  in the evening if needed for pain   Yes Historical Provider, MD  isosorbide mononitrate (IMDUR) 30 MG 24 hr tablet Take 1 tablet (30 mg total) by mouth daily. 08/08/14  Yes Tasrif Ahmed, MD  lisinopril-hydrochlorothiazide (PRINZIDE,ZESTORETIC) 20-12.5 MG per  tablet Take 2 tablets by mouth daily. 03/04/14  Yes [name], MD  Nutritional Supplements (ENSURE NUTRA SHAKE HI-CAL) LIQD Take 1 Can by mouth 2 (two) times daily. 09/12/14  Yes Campbell Riches, MD  sucralfate (CARAFATE) 1 G tablet Take 1 tablet (1 g total) by mouth 4 (four) times daily. 09/08/14  Yes Karren Cobble, MD    Current Facility-Administered Medications  Medication Dose Route Frequency Provider Last Rate Last Dose  . amLODipine (NORVASC) tablet 10 mg  10 mg Oral Daily Jones Bales, MD   10 mg at 09/13/14 0914  . aspirin EC tablet 81 mg  81 mg Oral Daily Jones Bales, MD   81 mg at 09/13/14 0915  . [START ON 09/14/2014] atazanavir (REYATAZ) capsule 400 mg  400 mg Oral Q breakfast [name], MD      . baclofen (LIORESAL) tablet 20 mg  20 mg Oral Daily PRN Jones Bales, MD      . emtricitabine-tenofovir (TRUVADA) 200-300 MG per tablet 1 tablet  1 tablet Oral Q breakfast Jones Bales, MD   1 tablet at 09/13/14 0915  . feeding supplement (ENSURE COMPLETE) (ENSURE COMPLETE) liquid 237 mL  1 Container Oral BID Jones Bales, MD   237 mL at 09/13/14 1000  . ferrous sulfate tablet 325 mg  325 mg Oral TID WC Jones Bales, MD   325 mg at 09/13/14 0915  . gabapentin (NEURONTIN) tablet 600 mg  600 mg Oral BID Jones Bales, MD   600 mg at 09/13/14 0915  . hydrALAZINE (APRESOLINE) tablet 10 mg  10 mg Oral 3 times per day Jones Bales, MD   10 mg at 09/13/14 0326  . hydrochlorothiazide (MICROZIDE) capsule 12.5 mg  12.5 mg Oral Daily Michel Bickers, MD   12.5 mg at 09/13/14 0914  . isosorbide mononitrate (IMDUR) 24 hr tablet 30 mg  30 mg Oral Daily Jones Bales, MD   30 mg at 09/13/14 0915  . lisinopril (PRINIVIL,ZESTRIL) tablet 20 mg  20 mg Oral Daily Michel Bickers, MD   20 mg at 09/13/14 0914  . [START ON 09/14/2014] pantoprazole (PROTONIX) injection 40 mg  40 mg Intravenous Q12H Jones Bales, MD      . sodium chloride 0.9 % injection 3 mL  3  mL Intravenous Q12H Jones Bales, MD      . sucralfate (CARAFATE) tablet 1 g  1 g Oral QID Jones Bales, MD   1 g at 09/13/14 0915    Allergies as of 09/12/2014 - Review Complete 09/12/2014  Allergen Reaction Noted  . Penicillins  01/26/2007  . Avelox [moxifloxacin hcl in nacl] Itching and Rash 10/13/2011    Family History  Problem Relation Age of Onset  . Diabetes Mother   . Ovarian cancer Sister   . Cancer Sister     ovarian and colon  . Colon cancer Neg Hx  History   Social History  . Marital Status: Single    Spouse Name: N/A    Number of Children: N/A  . Years of Education: N/A   Occupational History  . Not on file.   Social History Main Topics  . Smoking status: Current Every Day Smoker -- 0.25 packs/day for 50 years    Types: Cigarettes  . Smokeless tobacco: Never Used     Comment: working on it.  . Alcohol Use: 1.2 oz/week    2 Glasses of wine per week     Comment:  sometimes a glass of wine.  . Drug Use: No  . Sexual Activity: Not on file     Comment: pt. given condoms   Other Topics Concern  . Not on file   Social History Narrative  . No narrative on file    Review of Systems: ROS Dr. Megan Salon 09/13/14 reviewed and I agree  Physical Exam: Vital signs in last 24 hours: Temp:  [97.8 F (36.6 C)-99.1 F (37.3 C)] 99.1 F (37.3 C) (10/06 1155) Pulse Rate:  [53-70] 59 (10/06 0600) Resp:  [13-26] 16 (10/06 1155) BP: (154-241)/(43-159) 169/59 mmHg (10/06 1155) SpO2:  [96 %-100 %] 97 % (10/06 1155) Weight:  [58.968 kg (130 lb)] 58.968 kg (130 lb) (10/05 2221)   General:   Alert,  Well-developed, well-nourished, pleasant and cooperative in NAD, is older-appearing than stated age Head:  Normocephalic and atraumatic. Eyes:  Sclera clear, no icterus.   Conjunctiva pale Ears:  Normal auditory acuity. Nose:  No deformity, discharge,  or lesions. Mouth:  No deformity or lesions.  Oropharynx pale and somewhat dry. Neck:  Supple; no masses or  thyromegaly. Lungs:  Clear throughout to auscultation.   No wheezes, crackles, or rhonchi. No acute distress. Heart:  Regular rate and rhythm; II/VI SEM LUSB, otherwise no murmurs, clicks, rubs,  or gallops. Abdomen:  Soft, nontender and nondistended. No masses, hepatosplenomegaly or hernias noted. Normal bowel sounds, without guarding, and without rebound.     Msk:  Symmetrical without gross deformities. Normal posture. Pulses:  Normal pulses noted. Extremities:  Without clubbing or edema. Neurologic:  Alert and  oriented x4;  grossly normal neurologically. Skin:  Intact without significant lesions or rashes. Psych:  Alert and cooperative. Normal mood and affect.   Lab Results:  Recent Labs  09/12/14 1101 09/12/14 2229 09/13/14 0548  WBC 4.7 4.6 3.8*  HGB 6.2* 5.7* 7.0*  HCT 22.4* 20.3* 23.8*  PLT 305 276 230   BMET  Recent Labs  09/12/14 1101 09/12/14 2229 09/13/14 0548  NA 136 141 141  K 4.2 3.7 4.0  CL 104 105 106  CO2 24 25 25   GLUCOSE 84 112* 100*  BUN 19 19 19   CREATININE 0.93 0.91 0.84  CALCIUM 9.0 8.6 8.5   LFT  Recent Labs  09/12/14 2229  PROT 7.6  ALBUMIN 3.2*  AST 81*  ALT 72*  ALKPHOS 79  BILITOT 0.5   PT/INR  Recent Labs  09/12/14 1101 09/12/14 2306  LABPROT 14.0 14.8  INR 1.08 1.16    Studies/Results: Dg Chest Port 1 View  09/13/2014   CLINICAL DATA:  Acute onset of diffuse chest pain and shortness of breath. Initial encounter.  EXAM: PORTABLE CHEST - 1 VIEW  COMPARISON:  Chest radiograph performed 08/07/2014  FINDINGS: The lungs are well-aerated and clear. There is no evidence of focal opacification, pleural effusion or pneumothorax.  The cardiomediastinal silhouette is mildly enlarged. No acute osseous abnormalities are seen.  IMPRESSION: Mild cardiomegaly; no acute cardiopulmonary process seen.   Electronically Signed   By: Garald Balding M.D.   On: 09/13/2014 00:02    Impression:  1.  Iron-deficiency anemia.  Prior investigation  in 2012 showed small hiatal hernia and internal anal condylomata and small adenomatous polyp.   2.  Hemoccult-positive stools without evidence of overt GI bleeding. 3.  HIV. 4.  Multiple other medical problems.  Plan:  1.  Would repeat endoscopy and colonoscopy for further investigation. 2.  If endoscopy and colonoscopy are unrevealing, consider capsule endoscopy as next step in management. 3.  If endoscopy, colonoscopy and capsule endoscopy are all unrevealing, might have to consider hematology investigation into her recurring iron-deficiency anemia.   LOS: 1 day   Corrinna Karapetyan M  09/13/2014, 12:47 PM

## 2014-09-13 NOTE — H&P (Signed)
Date: 09/13/2014               Patient Name:  Nicole Mccormick MRN: [id number]  DOB: 1946-04-28 Age / Sex: 68 y.o., female   PCP: Drucilla Schmidt, MD              Medical Service: Internal Medicine Teaching Service              Attending Physician: Dr. Michel Bickers, MD    First Contact: Dr. Sherrine Maples Pager: [phone]  Second Contact: Dr. Redmond Pulling Pager: [phone]            After Hours (After 5p/  First Contact Pager: [phone]  weekends / holidays): Second Contact Pager: [phone]   Chief Complaint:  Anemia  History of Present Illness: Nicole Mccormick is a 68 year old woman with history of HIV, HCV, CAD, CHF, GERD, and iron deficiency anemia.   Ms. Rucci reports feeling like herself overall.  She went to see Dr. Johnnye Sima today to follow up her HIV and had blood drawn that showed a Hgb of 6.2.  She is on iron supplementation for iron deficiency anemia and reports taking her ferrous sulfate only once every 1-2 weeks when she feels like it.  She also reports using Bayer for arthritic pain 5-10 pills per day.  On additional questioning, she endorsed some weakness and SOB with exertion. She also noted occasional sharp abdominal pain that she thinks is related to gas accumulation.  She has lost approximately 10 pounds over the last few months due to decreased appetite.  She says this was largely driven by her GERD.  She was recently prescribed sucralfate, which has helped with her appetite.  She has also been supplementing her meals with Ensure.  She reports drinking wine once per week.  She had a positive FOBT on 10/12/11 during work-up for anemia, and she underwent a colonoscopy on 10/13/11 that demonstrated a colonic adenoma and anal condyloma.  EGD at that time demonstrated a hiatal hernia, but no additional pathology.  In the ER, repeat Hgb was 5.7 and FOBT was negative.  She was transfused 1 unit of RBCs.  Review of Systems: Review of Systems  Constitutional: Positive for weight loss. Negative for fever,  chills, malaise/fatigue and diaphoresis.  HENT: Negative for congestion and sore throat.   Eyes: Negative for blurred vision.  Respiratory: Negative for cough, sputum production, shortness of breath and wheezing.   Cardiovascular: Positive for leg swelling (When standing for long periods.). Negative for chest pain, palpitations, orthopnea and claudication.  Gastrointestinal: Positive for heartburn, abdominal pain and constipation. Negative for nausea, vomiting and diarrhea.  Genitourinary: Negative for dysuria and hematuria.  Musculoskeletal: Positive for myalgias. Negative for joint pain.  Skin: Negative for rash.  Neurological: Positive for weakness. Negative for dizziness, sensory change, focal weakness and headaches.  Endo/Heme/Allergies: Does not bruise/bleed easily.  Psychiatric/Behavioral: Negative for depression.    Meds: Medications Prior to Admission  Medication Sig Dispense Refill  . acyclovir (ZOVIRAX) 400 MG tablet Take 400 mg by mouth daily as needed (for flare ups).       Marland Kitchen amLODipine (NORVASC) 10 MG tablet Take 1 tablet (10 mg total) by mouth daily.  30 tablet  11  . aspirin EC 81 MG tablet Take 1 tablet (81 mg total) by mouth daily.  30 tablet  6  . Aspirin-Caffeine (BAYER BACK & BODY PAIN EX ST PO) Take 1-2 tablets by mouth daily as needed (pain).      Marland Kitchen atazanavir (  REYATAZ) 200 MG capsule Take 200 mg by mouth daily with breakfast. **SPACED 12 HOURS FROM ANTACIDS**      . baclofen (LIORESAL) 20 MG tablet Take 20 mg by mouth daily as needed for muscle spasms.       . Calcium Carbonate Antacid (ANTACID PO) Take 1 tablet by mouth 2 (two) times daily as needed (acid reflux).      Marland Kitchen emtricitabine-tenofovir (TRUVADA) 200-300 MG per tablet Take 1 tablet by mouth daily.  30 tablet  6  . famotidine (PEPCID) 40 MG tablet Take 1 tablet (40 mg total) by mouth daily.  30 tablet  3  . ferrous sulfate 325 (65 FE) MG tablet Take 325 mg by mouth 3 (three) times daily with meals.      .  gabapentin (NEURONTIN) 600 MG tablet Take 1 tablet (600 mg total) by mouth 3 (three) times daily.  90 tablet  3  . gabapentin (NEURONTIN) 600 MG tablet Take 600 mg by mouth See admin instructions. Take 600mg  every morning and 600mg  in the evening if needed for pain      . isosorbide mononitrate (IMDUR) 30 MG 24 hr tablet Take 1 tablet (30 mg total) by mouth daily.  30 tablet  3  . lisinopril-hydrochlorothiazide (PRINZIDE,ZESTORETIC) 20-12.5 MG per tablet Take 2 tablets by mouth daily.  60 tablet  6  . Nutritional Supplements (ENSURE NUTRA SHAKE HI-CAL) LIQD Take 1 Can by mouth 2 (two) times daily.  60 Can  0  . sucralfate (CARAFATE) 1 G tablet Take 1 tablet (1 g total) by mouth 4 (four) times daily.  120 tablet  11   Current Facility-Administered Medications  Medication Dose Route Frequency Provider Last Rate Last Dose  . amLODipine (NORVASC) tablet 10 mg  10 mg Oral Daily Jones Bales, MD      . aspirin EC tablet 81 mg  81 mg Oral Daily Jones Bales, MD      . atazanavir (REYATAZ) capsule 200 mg  200 mg Oral Q breakfast Jones Bales, MD      . baclofen (LIORESAL) tablet 20 mg  20 mg Oral Daily PRN Jones Bales, MD      . emtricitabine-tenofovir (TRUVADA) 200-300 MG per tablet 1 tablet  1 tablet Oral Q breakfast Jones Bales, MD      . feeding supplement (ENSURE COMPLETE) (ENSURE COMPLETE) liquid 237 mL  1 Container Oral BID Jones Bales, MD      . ferrous sulfate tablet 325 mg  325 mg Oral TID WC Jones Bales, MD      . gabapentin (NEURONTIN) tablet 600 mg  600 mg Oral BID Jones Bales, MD      . hydrALAZINE (APRESOLINE) tablet 10 mg  10 mg Oral 3 times per day Jones Bales, MD      . hydrochlorothiazide (MICROZIDE) capsule 12.5 mg  12.5 mg Oral Daily Michel Bickers, MD      . isosorbide mononitrate (IMDUR) 24 hr tablet 30 mg  30 mg Oral Daily Jones Bales, MD      . lisinopril (PRINIVIL,ZESTRIL) tablet 20 mg  20 mg Oral Daily Michel Bickers, MD      .  Derrill Memo ON 09/14/2014] pantoprazole (PROTONIX) injection 40 mg  40 mg Intravenous Q12H Jones Bales, MD      . sodium chloride 0.9 % injection 3 mL  3 mL Intravenous Q12H Jones Bales, MD   3 mL at 09/13/14 0253  .  sucralfate (CARAFATE) tablet 1 g  1 g Oral QID Jones Bales, MD        Allergies: Allergies as of 09/12/2014 - Review Complete 09/12/2014  Allergen Reaction Noted  . Penicillins  01/26/2007  . Avelox [moxifloxacin hcl in nacl] Itching and Rash 10/13/2011   Past Medical History  Diagnosis Date  . Hypertension   . Abnormal Pap smear   . Asthma   . Coronary artery disease   . HIV infection   . Varicosities   . Seizures     last sz fri nov 2  . GERD (gastroesophageal reflux disease)     previously on aciphex, discontinued november 2012  because patient asymptomatic, and concern about interference with HIV meds.  May try pepcid in the future if symptoms return  . Iron deficiency anemia     Ferritin = 2 in november 2012, started on iron supplemenation  . Arthritis   . CHF (congestive heart failure)   . Heart murmur    Past Surgical History  Procedure Laterality Date  . Abdominal hysterectomy    . Esophagogastroduodenoscopy  10/13/11    small hiatal hernia  . Colonoscopy  10/13/11    small adenoma, anal condyloma  . Colonoscopy  10/13/2011    Procedure: COLONOSCOPY;  Surgeon: Gatha Mayer, MD;  Location: [place];  Service: Endoscopy;  Laterality: N/A;  . Esophagogastroduodenoscopy  10/13/2011    Procedure: ESOPHAGOGASTRODUODENOSCOPY (EGD);  Surgeon: Gatha Mayer, MD;  Location: [facility]COPY;  Service: Endoscopy;  Laterality: N/A;   Family History  Problem Relation Age of Onset  . Diabetes Mother   . Ovarian cancer Sister   . Cancer Sister     ovarian and colon  . Colon cancer Neg Hx    History   Social History  . Marital Status: Single    Spouse Name: N/A    Number of Children: N/A  . Years of Education: N/A   Occupational History  . Not on  file.   Social History Main Topics  . Smoking status: Current Every Day Smoker -- 0.25 packs/day for 50 years    Types: Cigarettes  . Smokeless tobacco: Never Used     Comment: working on it.  . Alcohol Use: 1.2 oz/week    2 Glasses of wine per week     Comment:  sometimes a glass of wine.  . Drug Use: No  . Sexual Activity: Not on file     Comment: pt. given condoms   Other Topics Concern  . Not on file   Social History Narrative  . No narrative on file    Physical Exam: Filed Vitals:   09/13/14 0300  BP: 179/90  Pulse: 57  Temp: 98.8 F (37.1 C)  Resp: 26   Physical Exam  Constitutional: She is oriented to person, place, and time and well-developed, well-nourished, and in no distress. No distress.  HENT:  Head: Normocephalic and atraumatic.  Eyes: Conjunctivae and EOM are normal. Pupils are equal, round, and reactive to light.  Neck: Normal range of motion. Neck supple. No thyromegaly present.  Cardiovascular: Normal rate and regular rhythm.  Exam reveals no gallop and no friction rub.   Murmur (2/6 systolic.) heard. Pulmonary/Chest: Effort normal. No respiratory distress. She has wheezes (Diffuse.). She has no rales.  Abdominal: Soft. Bowel sounds are normal. She exhibits no distension. There is no tenderness.  Musculoskeletal: Normal range of motion. She exhibits no edema and no tenderness.  Lymphadenopathy:    She  has no cervical adenopathy.  Neurological: She is alert and oriented to person, place, and time. No cranial nerve deficit. She exhibits normal muscle tone.  Skin: Skin is warm and dry. No rash noted. She is not diaphoretic. No erythema.    Lab results: Basic Metabolic Panel:  Recent Labs  09/12/14 1101 09/12/14 2229  NA 136 141  K 4.2 3.7  CL 104 105  CO2 24 25  GLUCOSE 84 112*  BUN 19 19  CREATININE 0.93 0.91  CALCIUM 9.0 8.6   Liver Function Tests:  Recent Labs  09/12/14 1101 09/12/14 2229  AST 84* 81*  ALT 79* 72*  ALKPHOS 76 79   BILITOT 0.7 0.5  PROT 7.5 7.6  ALBUMIN 3.8 3.2*   CBC:  Recent Labs  09/12/14 1101 09/12/14 2229  WBC 4.7 4.6  NEUTROABS 2.6 2.3  HGB 6.2* 5.7*  HCT 22.4* 20.3*  MCV 73.9* 73.8*  PLT 305 276   Anemia Panel:  Recent Labs  09/12/14 1101 09/13/14 0002  IRON 13*  --   RETICCTPCT  --  2.1   Coagulation:  Recent Labs  09/12/14 1101 09/12/14 2306  LABPROT 14.0 14.8  INR 1.08 1.16    Imaging results:  Dg Chest Port 1 View  09/13/2014   CLINICAL DATA:  Acute onset of diffuse chest pain and shortness of breath. Initial encounter.  EXAM: PORTABLE CHEST - 1 VIEW  COMPARISON:  Chest radiograph performed 08/07/2014  FINDINGS: The lungs are well-aerated and clear. There is no evidence of focal opacification, pleural effusion or pneumothorax.  The cardiomediastinal silhouette is mildly enlarged. No acute osseous abnormalities are seen.  IMPRESSION: Mild cardiomegaly; no acute cardiopulmonary process seen.   Electronically Signed   By: Garald Balding M.D.   On: 09/13/2014 00:02    Other results: EKG: normal sinus rhythm, decreased  R wave progression through anterior leads.  Assessment & Plan by Problem: Active Problems:   Acute blood loss anemia   #Iron deficiency anemia Mild anemia symptoms, not currently bothering patient. Hgb has dropped from 13.5 in 4/15 to 5.7 currently with decrease from 6.2 ten hours earlier.  History of iron deficiency anemia with ferritin of 2, but no source of bleeding identified on colonoscopy and EGD in 2012. Hgb was in the 5s at that time as well, and she was transfused 2 units RBCs with stabilization of her hemoglobin.  FOBT negative currently.  With frequent aspirin use and occasional abdominal pain, ulcer or gastritis would appear to be most likely source of her bleeding.  Does have history of GERD and seems to be responding to sucralfate.  History of low-grade squamous intraepithelial lesion on pap smear in 09/25/2011, but reports this is long term  and has been told that she does not need pap smears any more.  Hysterectomy in the past.  Had been on iron supplementation, but had not been taking adequate supplementation.  Weight loss concerning for malignant process. -Admit to step down. -NPO except sips with meds for possible procedure. -Consult GI for repeat EGD. -Blood smear. -Anemia panel. -Protonix 40 mg IV q12h. -Ferrous sulfate 325 mg TID, instructed patient on correct dosing. -Repeat CBC after transfusion, consider another unit if Hgb <7. -Urinalysis. -Continue home Ensure once resumes diet.  #HIV Follows with Dr. Johnnye Sima of [facility]. Last CD4 510 on 08/07/14 with VL undetectable on 03/01/14. -Continue home atazanavir and Truvada.  #CAD Reports history of MI without stenting or CABG.  Echo 03/09/14 showed EF of 65-70% with  grade 1 DD and mild mitral regurgitation. -Continue home aspirin.  #Hypertension Blood pressure elevated in ER and upon arrival to the floor.  Reports taking medications today. -Continue home amlodipine, Imdur, and Prinzide. -Start hydrazine 10 mg q8h.  #GERD On famotidine at home and sucralfate. -Protonix as above. -Continue home sucralfate.  #Chronic pain and restless leg syndrome -Continue home baclofen. -Continue home gabapentin.  #Herpes Last used acyclovir several months ago. -Continue acyclovir PRN for flare ups.  #HCV Antibody positive with elevated LFTs.  Dr. Johnnye Sima re-ordered HCV tests yesterday and plans to refer her for treatment. -Follow up HCV tests drawn yesterday.  Dispo: Disposition is deferred at this time, awaiting improvement of current medical problems. Anticipated discharge in approximately 1-2 day(s).   The patient does have a current PCP Drucilla Schmidt, MD), therefore will be require OPC follow-up after discharge.   The patient does have transportation limitations that hinder transportation to clinic appointments.   Signed:  Arman Filter, MD, PhD PGY-1 Internal  Medicine Teaching Service Pager: [phone] 09/13/2014, 3:16 AM

## 2014-09-14 ENCOUNTER — Encounter (HOSPITAL_COMMUNITY): Payer: PRIVATE HEALTH INSURANCE | Admitting: Anesthesiology

## 2014-09-14 ENCOUNTER — Inpatient Hospital Stay (HOSPITAL_COMMUNITY): Payer: PRIVATE HEALTH INSURANCE | Admitting: Anesthesiology

## 2014-09-14 ENCOUNTER — Encounter (HOSPITAL_COMMUNITY): Payer: Self-pay | Admitting: *Deleted

## 2014-09-14 ENCOUNTER — Encounter (HOSPITAL_COMMUNITY)
Admission: EM | Disposition: A | Discharge status: Home / Self Care | Payer: Self-pay | Source: Home / Self Care | Attending: Internal Medicine

## 2014-09-14 HISTORY — PX: ESOPHAGOGASTRODUODENOSCOPY: SHX5428

## 2014-09-14 HISTORY — PX: COLONOSCOPY: SHX5424

## 2014-09-14 HISTORY — PX: GIVENS CAPSULE STUDY: SHX5432

## 2014-09-14 LAB — TYPE AND SCREEN
ABO/RH(D): O POS
Antibody Screen: NEGATIVE
Unit division: 0

## 2014-09-14 LAB — CBC
HCT: 29.8 % — ABNORMAL LOW (ref 36.0–46.0)
Hemoglobin: 8.6 g/dL — ABNORMAL LOW (ref 12.0–15.0)
MCH: 22.3 pg — ABNORMAL LOW (ref 26.0–34.0)
MCHC: 28.9 g/dL — ABNORMAL LOW (ref 30.0–36.0)
MCV: 77.2 fL — ABNORMAL LOW (ref 78.0–100.0)
Platelets: 286 10*3/uL (ref 150–400)
RBC: 3.86 MIL/uL — ABNORMAL LOW (ref 3.87–5.11)
RDW: 19.6 % — ABNORMAL HIGH (ref 11.5–15.5)
WBC: 4.7 10*3/uL (ref 4.0–10.5)

## 2014-09-14 LAB — HEPATITIS C RNA QUANTITATIVE
HCV Quantitative Log: 6.51 {Log} — ABNORMAL HIGH (ref <1.18)
HCV Quantitative: 3270623 IU/mL — ABNORMAL HIGH (ref <15)

## 2014-09-14 SURGERY — IMAGING PROCEDURE, GI TRACT, INTRALUMINAL, VIA CAPSULE
Anesthesia: LOCAL

## 2014-09-14 SURGERY — EGD (ESOPHAGOGASTRODUODENOSCOPY)
Anesthesia: Monitor Anesthesia Care

## 2014-09-14 MED ORDER — MIDAZOLAM HCL 5 MG/5ML IJ SOLN
INTRAMUSCULAR | Status: DC | PRN
  Administered 2014-09-14 (×2): 1 mg via INTRAVENOUS

## 2014-09-14 MED ORDER — PANTOPRAZOLE SODIUM 40 MG IV SOLR
40.0000 mg | Freq: Two times a day (BID) | INTRAVENOUS | Status: AC
  Administered 2014-09-14 (×2): 40 mg via INTRAVENOUS

## 2014-09-14 MED ORDER — ASPIRIN EC 81 MG PO TBEC
81.0000 mg | DELAYED_RELEASE_TABLET | Freq: Every day | ORAL | Status: DC
  Administered 2014-09-14 – 2014-09-15 (×2): 81 mg via ORAL
  Filled 2014-09-14 (×2): qty 1

## 2014-09-14 MED ORDER — HYDRALAZINE HCL 20 MG/ML IJ SOLN: 2.0000 mg | Freq: Four times a day (QID) | INTRAMUSCULAR | Status: DC | PRN

## 2014-09-14 MED ORDER — HYDRALAZINE HCL 10 MG PO TABS
10.0000 mg | ORAL_TABLET | Freq: Once | ORAL | Status: AC
  Administered 2014-09-14: 10 mg via ORAL
  Filled 2014-09-14: qty 1

## 2014-09-14 MED ORDER — FENTANYL CITRATE 0.05 MG/ML IJ SOLN: 25.0000 ug | INTRAMUSCULAR | Status: DC | PRN

## 2014-09-14 MED ORDER — PROPOFOL INFUSION 10 MG/ML OPTIME
INTRAVENOUS | Status: DC | PRN
Start: 2014-09-14 — End: 2014-09-14
  Administered 2014-09-14: 75 ug/kg/min via INTRAVENOUS

## 2014-09-14 MED ORDER — MEPERIDINE HCL 25 MG/ML IJ SOLN: 6.2500 mg | INTRAMUSCULAR | Status: DC | PRN

## 2014-09-14 MED ORDER — ENSURE COMPLETE PO LIQD
1.0000 | Freq: Two times a day (BID) | ORAL | Status: DC
  Administered 2014-09-14 – 2014-09-15 (×2): 237 mL via ORAL

## 2014-09-14 MED ORDER — LACTATED RINGERS IV SOLN
INTRAVENOUS | Status: DC | PRN
  Administered 2014-09-14: 11:00:00 via INTRAVENOUS

## 2014-09-14 MED ORDER — LACTATED RINGERS IV SOLN
INTRAVENOUS | Status: DC
  Administered 2014-09-14: 1000 mL via INTRAVENOUS

## 2014-09-14 MED ORDER — PROMETHAZINE HCL 25 MG/ML IJ SOLN: 6.2500 mg | INTRAMUSCULAR | Status: DC | PRN

## 2014-09-14 MED ORDER — GABAPENTIN 600 MG PO TABS
600.0000 mg | ORAL_TABLET | Freq: Two times a day (BID) | ORAL | Status: DC
  Administered 2014-09-14 – 2014-09-15 (×3): 600 mg via ORAL
  Filled 2014-09-14 (×4): qty 1

## 2014-09-14 MED ORDER — ISOSORBIDE MONONITRATE ER 30 MG PO TB24
30.0000 mg | ORAL_TABLET | Freq: Every day | ORAL | Status: DC
  Administered 2014-09-14 – 2014-09-15 (×2): 30 mg via ORAL
  Filled 2014-09-14 (×2): qty 1

## 2014-09-14 MED ORDER — LISINOPRIL 20 MG PO TABS
20.0000 mg | ORAL_TABLET | Freq: Every day | ORAL | Status: DC
  Administered 2014-09-14 – 2014-09-15 (×2): 20 mg via ORAL
  Filled 2014-09-14 (×2): qty 1

## 2014-09-14 MED ORDER — HYDROCHLOROTHIAZIDE 12.5 MG PO CAPS
12.5000 mg | ORAL_CAPSULE | Freq: Every day | ORAL | Status: DC
  Administered 2014-09-14 – 2014-09-15 (×2): 12.5 mg via ORAL
  Filled 2014-09-14 (×2): qty 1

## 2014-09-14 MED ORDER — SUCRALFATE 1 G PO TABS
1.0000 g | ORAL_TABLET | Freq: Four times a day (QID) | ORAL | Status: DC
  Administered 2014-09-14 – 2014-09-15 (×5): 1 g via ORAL
  Filled 2014-09-14 (×7): qty 1

## 2014-09-14 MED ORDER — ONDANSETRON HCL 4 MG/2ML IJ SOLN
INTRAMUSCULAR | Status: DC | PRN
  Administered 2014-09-14: 4 mg via INTRAVENOUS

## 2014-09-14 MED ORDER — HYDRALAZINE HCL 20 MG/ML IJ SOLN: 2.0000 mg | Freq: Once | INTRAMUSCULAR | Status: DC

## 2014-09-14 MED ORDER — ATAZANAVIR SULFATE 200 MG PO CAPS
400.0000 mg | ORAL_CAPSULE | Freq: Every day | ORAL | Status: DC
  Filled 2014-09-14 (×2): qty 2

## 2014-09-14 MED ORDER — HYDRALAZINE HCL 25 MG PO TABS
25.0000 mg | ORAL_TABLET | Freq: Three times a day (TID) | ORAL | Status: DC
  Administered 2014-09-14 – 2014-09-15 (×3): 25 mg via ORAL
  Filled 2014-09-14 (×5): qty 1

## 2014-09-14 MED ORDER — DARUNAVIR-COBICISTAT 800-150 MG PO TABS
1.0000 | ORAL_TABLET | Freq: Every day | ORAL | Status: DC
  Administered 2014-09-14 – 2014-09-15 (×2): 1 via ORAL
  Filled 2014-09-14 (×2): qty 1

## 2014-09-14 MED ORDER — FENTANYL CITRATE 0.05 MG/ML IJ SOLN
INTRAMUSCULAR | Status: DC | PRN
  Administered 2014-09-14 (×4): 25 ug via INTRAVENOUS

## 2014-09-14 MED ORDER — AMLODIPINE BESYLATE 10 MG PO TABS
10.0000 mg | ORAL_TABLET | Freq: Every day | ORAL | Status: DC
  Administered 2014-09-14 – 2014-09-15 (×2): 10 mg via ORAL
  Filled 2014-09-14 (×2): qty 1

## 2014-09-14 MED ORDER — EMTRICITABINE-TENOFOVIR DF 200-300 MG PO TABS
1.0000 | ORAL_TABLET | Freq: Every day | ORAL | Status: DC
  Administered 2014-09-14 – 2014-09-15 (×2): 1 via ORAL
  Filled 2014-09-14 (×3): qty 1

## 2014-09-14 MED ORDER — FERROUS SULFATE 325 (65 FE) MG PO TABS
325.0000 mg | ORAL_TABLET | Freq: Three times a day (TID) | ORAL | Status: DC
  Administered 2014-09-14 – 2014-09-15 (×4): 325 mg via ORAL
  Filled 2014-09-14 (×6): qty 1

## 2014-09-14 SURGICAL SUPPLY — 1 items: TOWEL COTTON PACK 4EA (MISCELLANEOUS) ×4 IMPLANT

## 2014-09-14 NOTE — Progress Notes (Signed)
  I have seen and examined the patient, and reviewed the daily progress note by Josiah Lobo, MS4 and discussed the care of the patient with them. Please see my progress note from 09/14/2014 for further details regarding assessment and plan.    Signed:  [name], DO 09/14/2014, 12:07 PM

## 2014-09-14 NOTE — H&P (View-Only) (Signed)
Subjective: Nicole Mccormick was seen and examined this morning.  She is feeling well and denies blood loss, lightheadedness, dyspnea or chest pain.  She reports taking ASA, BC powder and Goody powder at home for back pain.  She said she did not know she should take the Fe supplement TID and she was only taking it once daily.  Objective: Vital signs in last 24 hours: Filed Vitals:   09/14/14 0410 09/14/14 0546 09/14/14 0810 09/14/14 1039  BP: 137/49 191/90 192/44 210/68  Pulse: 48  53 44  Temp:  98.1 F (36.7 C) 98.2 F (36.8 C)   TempSrc:  Oral Oral Oral  Resp:   16 15  Height:      Weight:      SpO2: 100%  100% 100%   Weight change:   Intake/Output Summary (Last 24 hours) at 09/14/14 1058 Last data filed at 09/13/14 2200  Gross per 24 hour  Intake 1615.67 ml  Output      0 ml  Net 1615.67 ml   Vitals reviewed. General: sitting up in bed, in NAD HEENT no scleral icterus Cardiac: RRR Pulm: clear to auscultation bilaterally Abd: soft, nontender, nondistended, BS present Ext: warm and well perfused, no pedal edema Neuro: alert and oriented X3, moves all extremities voluntairly  Lab Results:  CBC:  Recent Labs Lab 09/12/14 1101 09/12/14 2229  09/13/14 1407 09/14/14 0001  WBC 4.7 4.6  < > 4.6 4.7  NEUTROABS 2.6 2.3  --   --   --   HGB 6.2* 5.7*  < > 8.2* 8.6*  HCT 22.4* 20.3*  < > 27.8* 29.8*  MCV 73.9* 73.8*  < > 76.8* 77.2*  PLT 305 276  < > 242 286  < > = values in this interval not displayed.   Medications: I have reviewed the patient's current medications. Scheduled Meds: . amLODipine  10 mg Oral Daily  . aspirin EC  81 mg Oral Daily  . atazanavir  400 mg Oral Q breakfast  . emtricitabine-tenofovir  1 tablet Oral Q breakfast  . feeding supplement (ENSURE COMPLETE)  1 Container Oral BID  . ferrous sulfate  325 mg Oral TID WC  . gabapentin  600 mg Oral BID  . hydrALAZINE  10 mg Oral 3 times per day  . hydrochlorothiazide  12.5 mg Oral Daily  . isosorbide  mononitrate  30 mg Oral Daily  . lisinopril  20 mg Oral Daily  . pantoprazole (PROTONIX) IV  40 mg Intravenous Q12H  . sodium chloride  3 mL Intravenous Q12H  . sucralfate  1 g Oral QID   Continuous Infusions: . sodium chloride 20 mL/hr at 09/13/14 2013  . sodium chloride     PRN Meds:.baclofen  Assessment/Plan: 68 year old woman with PMH of HIV disease (well controlled), CAD, HTN, GERD, HCV, presenting with anemia with Hgb of 5.7.   Iron deficiency anemia: She has hx of Fe deficiency anemia but has not been compliant with TID po Fe.  Instead she has been taking it once daily.  She also notes taking BC powder and Goody powder in addition to ASA.  This combo of NSAID may have led to UGIB and anemia further worsened by po Fe non-compliance.  - appreciated GI recommendations and intervention; plan for EGD/colonoscopy today  - continue PPI BID for now - continue ferrous sulfate TID with meals  HIV: well controlled with last CD4 count of 510 in 07/11/14 with VL undetectable in 03/01/14. - change home  atazavanir to darunavir (Prezista) to avoid interaction with PPI  - continue home Truvada  CAD: Stable. Has hx of MI but no CABG. Continue home ASA for now--pending further GI eval.   HTN: BP elevated this am to 190s/100s but improved after her anti-hypertensive were given. Pt was asymptomatic. She in on amlodipine 10mg  daily, HCTZ 12.5mg  daily, imdur 30mg  daily, lisinopril 20mg  daily which were continued.  - continue hydralazine 10mg  TID which was added 10/06  GERD: Continue home sucralfate, started protonix BID.   Chronic back pain: Thought to be secondary to arthritis with CT chest from 2012 showing thoracic kyphosis and scoliosis with mild anterior wedging mid thoracic vertebra without bony destructive lesion. - continue home Baclofen and gabapentin, Tylenol 500mg  q6h PRN for pain - discouraged continued use of aspirin and NSAIDs for arthritis pain  VTE prophylaxis: SCDs; no pharmacologic  VTE ppx given anemia and possible bleed.  FEN NSL Replete as needed Clear liquid diet   Dispo: Disposition is deferred at this time, awaiting improvement of current medical problems.  Anticipated discharge in approximately 1 day(s).   The patient does have a current PCP Drucilla Schmidt, MD) and does need an [facility] hospital follow-up appointment after discharge.  The patient does not have transportation limitations that hinder transportation to clinic appointments.  .Services Needed at time of discharge: Y = Yes, Blank = No PT:   OT:   RN:   Equipment:   Other:     LOS: 2 days   [name], DO 09/14/2014, 10:58 AM

## 2014-09-14 NOTE — Progress Notes (Signed)
Subjective: Nicole Mccormick was seen before her procedure this morning. She had no complaints and denied any dyspnea, fatigue, and dizziness.   Objective: Vital signs in last 24 hours: Filed Vitals:   09/13/14 2016 09/13/14 2313 09/14/14 0410 09/14/14 0546  BP: 173/58 153/97 137/49 191/90  Pulse:  59 48   Temp:  98.2 F (36.8 C)  98.1 F (36.7 C)  TempSrc:  Oral  Oral  Resp: 16     Height:      Weight:      SpO2:  98% 100%    Weight change:   Intake/Output Summary (Last 24 hours) at 09/14/14 0808 Last data filed at 09/13/14 2200  Gross per 24 hour  Intake 1858.67 ml  Output      0 ml  Net 1858.67 ml   Vitals reviewed.  General: resting in bed, in NAD  HEENT no scleral icterus  Cardiac: RRR, no rubs, no murmurs, no gallops  Pulm: clear to auscultation bilaterally, rales heard initially at the bases but resolved after patient coughed Back: slightly kyphotic  Abd: soft, nontender, nondistended, BS present  Ext: warm and well perfused, no pedal edema  Neuro: alert and oriented X3, moves all extremities voluntairly   Lab Results: Results for LEAUNA, SHARBER (MRN [id number]) as of 09/14/2014 08:08  Ref. Range 09/13/2014 14:07 09/14/2014 00:01  WBC Latest Range: 4.0-10.5 K/uL 4.6 4.7  RBC Latest Range: 3.87-5.11 MIL/uL 3.62 (L) 3.86 (L)  Hemoglobin Latest Range: 12.0-15.0 g/dL 8.2 (L) 8.6 (L)  HCT Latest Range: 36.0-46.0 % 27.8 (L) 29.8 (L)  MCV Latest Range: 78.0-100.0 fL 76.8 (L) 77.2 (L)  MCH Latest Range: 26.0-34.0 pg 22.7 (L) 22.3 (L)  MCHC Latest Range: 30.0-36.0 g/dL 29.5 (L) 28.9 (L)  RDW Latest Range: 11.5-15.5 % 19.3 (H) 19.6 (H)  Platelets Latest Range: 150-400 K/uL 242 286    Micro Results: Recent Results (from the past 240 hour(s))  MRSA PCR SCREENING     Status: None   Collection Time    09/13/14  2:07 AM      Result Value Ref Range Status   MRSA by PCR NEGATIVE  NEGATIVE Final   Comment:            The GeneXpert MRSA Assay (FDA     approved for NASAL specimens       only), is one component of a     comprehensive MRSA colonization     surveillance program. It is not     intended to diagnose MRSA     infection nor to guide or     monitor treatment for     MRSA infections.   Studies/Results: Dg Chest Port 1 View  09/13/2014   CLINICAL DATA:  Acute onset of diffuse chest pain and shortness of breath. Initial encounter.  EXAM: PORTABLE CHEST - 1 VIEW  COMPARISON:  Chest radiograph performed 08/07/2014  FINDINGS: The lungs are well-aerated and clear. There is no evidence of focal opacification, pleural effusion or pneumothorax.  The cardiomediastinal silhouette is mildly enlarged. No acute osseous abnormalities are seen.  IMPRESSION: Mild cardiomegaly; no acute cardiopulmonary process seen.   Electronically Signed   By: Garald Balding M.D.   On: 09/13/2014 00:02   Medications:  Scheduled Meds: . amLODipine  10 mg Oral Daily  . aspirin EC  81 mg Oral Daily  . atazanavir  400 mg Oral Q breakfast  . emtricitabine-tenofovir  1 tablet Oral Q breakfast  . feeding supplement (ENSURE COMPLETE)  1 Container  Oral BID  . ferrous sulfate  325 mg Oral TID WC  . gabapentin  600 mg Oral BID  . hydrALAZINE  10 mg Oral 3 times per day  . hydrochlorothiazide  12.5 mg Oral Daily  . isosorbide mononitrate  30 mg Oral Daily  . lisinopril  20 mg Oral Daily  . pantoprazole (PROTONIX) IV  40 mg Intravenous Q12H  . sodium chloride  3 mL Intravenous Q12H  . sucralfate  1 g Oral QID   Continuous Infusions: . sodium chloride 20 mL/hr at 09/13/14 2013  . sodium chloride     PRN Meds:.baclofen  Assessment/Plan: Principal Problem:   Iron deficiency anemia Active Problems:   Human immunodeficiency virus (HIV) disease   TOBACCO ABUSE   Gastroesophageal reflux disease without esophagitis   Essential hypertension, benign   CAD (coronary artery disease)   Hepatitis C  Nicole. Nicole Mccormick is a 68 year old female with a history of HIV (VL undetectable, CD4 of 620 on 10/05), iron  deficiency anemia, HCV, and GERD who presented with anemia with a Hgb of 5.2.   #Acute on chronic iron deficiency anemia: Hgb on 10/7 at 0000 was 8.6. Currently asymptomatic with ferritin of 1, iron of 13, and TIBC of 538 (1 mo ago).  Hgb yesterday at clinic was 5.7. As per lab results, hemoglobin was 13.5 in 4/15 to 7.2 in 08/31. FOBT was negative. Received 2 units of pRBCs overnight and post-transfusion Hgb was 7.0 at 0548 and 8.2 at 1407. She presented for similar presentation in 2012 and received a GI workup by Dr. Carlean Purl, which revealed a benign adenomatous polyp and anal condyloma. After that admission, patient recalls taking her iron supplementation on a regular basis; however, recently she has not been due to nausea and and takes it as needed for fatigue. She reports taking 4-5 tabs of Bayer Aspirin, BC powder, and Goody's powder for her back pain recently. Differential includes peptic ulcer disease given her recent extensive aspirin use and occasional abdominal pain, colonic diverticula,  polyp, or even malignancy given her weight loss of 10 pounds (which she attributes to decreased appetite). Another cause to consider is bone marrow suppression.  Hemolytic anemia is also considered given her elevated LDH but cannot obtain further labs given that she has received transfusion. ANA, B12, and folate are all normal. Given the nature of her decline of her hemoglobin, it is suspicious that there is a chronic source of bleeding that needs to be identified.  Dr. Paulita Fujita of GI has been consulted and will pursue with GI workup of endoscopy and colonoscopy to identify any GI source that is contributing to her acute anemia.  --trend Hgb q12h  --if Hgb < 7, consider transfusion   --appreciated Dr. Erlinda Hong involvement, endoscopy and colonscopy planned for today, 10/7; if unrevealing, consider capsule endoscopy --if capsule endoscopy is negative, consider hematologic investigation  --will need to restart taking  oral iron supplementation on discharge   #HIV: well-controlled, last CD4 count on 10/05 was 620.  --continue home atazanavir and Truvada  --Consider switching to darunavir given that it will be less affected if patient gets discharged on PPI compared to atazanavir   #GERD  On famotidine at home and sucralfate.  --Protonix as above; appreciate pharmacy's recommendation that use of PPI might decrease effectiveness of patient's atazanavir  -Continue home sucralfate.   #Hypertension : Blood pressure elevated in ER and continues to be 361-443X systolic during admission. Patient reports compliance to her antihypertensives at home.  --Continue  home amlodipine, Imdur, and Prinzide.  --Continue hydrazine 10 mg q8h PO --If BP >200/100, consider giving hydralazine 2 mg IV  --will follow up with Dr. Sherrine Maples in clinic to manage her antihypertensives on discharge   #CAD  Reports history of MI without stenting or CABG. Echo 03/09/14 showed EF of 65-70% with grade 1 DD and mild mitral regurgitation.  --Continue home aspirin.   #HCV  Antibody positive with elevated LFTs. Dr. Johnnye Sima re-ordered HCV tests yesterday and plans to refer her for treatment.  [ ]  Hep C genotype [ ]  Hep C RNA load [ ]  Follow up HCV tests drawn yesterday.   #Chronic back pain: This is likely secondary to arthiritis given that her CT chest from 2012 showed thoracic kyphosis and scoliosis with mild anterior wedging mid thoracic vertebra without bony destructive lesion.  --Continue home Baclofen and gabapentin  --Discouraged the use of Bayer aspirin, BC Powder, and Goody powder  --Tylenol 500mg  q6h PRN for pain  --consider recommending to start Tylenol on discharge   #VTE prophylaxis: SCDs   This is a Careers information officer Note.  The care of the patient was discussed with Dr. Megan Salon and Dr. Redmond Pulling and the assessment and plan formulated with their assistance.  Please see their attached note for official documentation of the daily  encounter.   LOS: 2 days   Josiah Lobo, Med Student 09/14/2014, 8:08 AM

## 2014-09-14 NOTE — Op Note (Signed)
[facility] [place], [address]   COLONOSCOPY PROCEDURE REPORT  PATIENT: Nicole, Mccormick  MR#: [id number] BIRTHDATE: 06-21-1946 , 39  yrs. old GENDER: female ENDOSCOPIST: Arta Silence, MD REFERRED KG:[facility] Hospitalist PROCEDURE DATE:  10/09/14 PROCEDURE:   Colonoscopy, diagnostic ASA CLASS:   Class III INDICATIONS:iron-deficiency anemia. MEDICATIONS: Monitored anesthesia care  DESCRIPTION OF PROCEDURE:   After the risks benefits and alternatives of the procedure were thoroughly explained, informed consent was obtained.  revealed no abnormalities of the rectum. The pediatric colonoscope was introduced through the anus and advanced to the terminal ileum which was intubated for a short distance. No adverse events experienced.   The quality of the prep was     The instrument was then slowly withdrawn as the colon was fully examined.  Findings:  Normal digital rectal exam.  Small verrucous fleshy protuberance arising from anal verge, upon retroflexion, previously biopsied (condylomatous), otherwise normal retroflexed view of rectum.  Couple small diverticula seen.  No other polyps, masses, vascular ectasias, or inflammatory changes were seen.  Distal 10cm of the terminal ileum was normal.           Withdrawal time was   . The scope was withdrawn and the procedure completed.  COMPLICATIONS: None  ENDOSCOPIC IMPRESSION:     As above.  No source of iron-deficiency anemia identified.  RECOMMENDATIONS:     1.  Watch for potential complications of procedure. 2.  Inpatient capsule endoscopy for further investigation into her iron-deficiency anemia. 3.  [facility] will follow.  eSigned:  Arta Silence, MD 09-Oct-2014 12:59 PM   cc:  CPT CODES: ICD CODES:  The ICD and CPT codes recommended by this software are interpretations from the data that the clinical staff has captured with the software.  The verification of the translation of  this report to the ICD and CPT codes and modifiers is the sole responsibility of the health care institution and practicing physician where this report was generated.  [place]. will not be held responsible for the validity of the ICD and CPT codes included on this report.  AMA assumes no liability for data contained or not contained herein. CPT is a Designer, television/film set of the [employer].

## 2014-09-14 NOTE — Progress Notes (Addendum)
Subjective: Nicole Mccormick was seen and examined this morning.  She is feeling well and denies blood loss, lightheadedness, dyspnea or chest pain.  She reports taking ASA, BC powder and Goody powder at home for back pain.  She said she did not know she should take the Fe supplement TID and she was only taking it once daily.  Objective: Vital signs in last 24 hours: Filed Vitals:   09/14/14 0410 09/14/14 0546 09/14/14 0810 09/14/14 1039  BP: 137/49 191/90 192/44 210/68  Pulse: 48  53 44  Temp:  98.1 F (36.7 C) 98.2 F (36.8 C)   TempSrc:  Oral Oral Oral  Resp:   16 15  Height:      Weight:      SpO2: 100%  100% 100%   Weight change:   Intake/Output Summary (Last 24 hours) at 09/14/14 1058 Last data filed at 09/13/14 2200  Gross per 24 hour  Intake 1615.67 ml  Output      0 ml  Net 1615.67 ml   Vitals reviewed. General: sitting up in bed, in NAD HEENT no scleral icterus Cardiac: RRR Pulm: clear to auscultation bilaterally Abd: soft, nontender, nondistended, BS present Ext: warm and well perfused, no pedal edema Neuro: alert and oriented X3, moves all extremities voluntairly  Lab Results:  CBC:  Recent Labs Lab 09/12/14 1101 09/12/14 2229  09/13/14 1407 09/14/14 0001  WBC 4.7 4.6  < > 4.6 4.7  NEUTROABS 2.6 2.3  --   --   --   HGB 6.2* 5.7*  < > 8.2* 8.6*  HCT 22.4* 20.3*  < > 27.8* 29.8*  MCV 73.9* 73.8*  < > 76.8* 77.2*  PLT 305 276  < > 242 286  < > = values in this interval not displayed.   Medications: I have reviewed the patient's current medications. Scheduled Meds: . amLODipine  10 mg Oral Daily  . aspirin EC  81 mg Oral Daily  . atazanavir  400 mg Oral Q breakfast  . emtricitabine-tenofovir  1 tablet Oral Q breakfast  . feeding supplement (ENSURE COMPLETE)  1 Container Oral BID  . ferrous sulfate  325 mg Oral TID WC  . gabapentin  600 mg Oral BID  . hydrALAZINE  10 mg Oral 3 times per day  . hydrochlorothiazide  12.5 mg Oral Daily  . isosorbide  mononitrate  30 mg Oral Daily  . lisinopril  20 mg Oral Daily  . pantoprazole (PROTONIX) IV  40 mg Intravenous Q12H  . sodium chloride  3 mL Intravenous Q12H  . sucralfate  1 g Oral QID   Continuous Infusions: . sodium chloride 20 mL/hr at 09/13/14 2013  . sodium chloride     PRN Meds:.baclofen  Assessment/Plan: 68 year old woman with PMH of HIV disease (well controlled), CAD, HTN, GERD, HCV, presenting with anemia with Hgb of 5.7.   Iron deficiency anemia: She has hx of Fe deficiency anemia but has not been compliant with TID po Fe.  Instead she has been taking it once daily.  She also notes taking BC powder and Goody powder in addition to ASA.  This combo of NSAID may have led to UGIB and anemia further worsened by po Fe non-compliance.  - appreciated GI recommendations and intervention; plan for EGD/colonoscopy today  - continue PPI BID for now - continue ferrous sulfate TID with meals  HIV: well controlled with last CD4 count of 510 in 07/11/14 with VL undetectable in 03/01/14. - change home  atazavanir to darunavir- cobicistat (Prezcobix) to avoid interaction with PPI  - continue home Truvada  CAD: Stable. Has hx of MI but no CABG. Continue home ASA for now--pending further GI eval.   HTN: BP elevated this am to 190s/100s but improved after her anti-hypertensive were given. Pt was asymptomatic. She in on amlodipine 10mg  daily, HCTZ 12.5mg  daily, imdur 30mg  daily, lisinopril 20mg  daily which were continued.  - continue hydralazine 10mg  TID which was added 10/06  GERD: Continue home sucralfate, started protonix BID.   Chronic back pain: Thought to be secondary to arthritis with CT chest from 2012 showing thoracic kyphosis and scoliosis with mild anterior wedging mid thoracic vertebra without bony destructive lesion. - continue home Baclofen and gabapentin, Tylenol 500mg  q6h PRN for pain - discouraged continued use of aspirin and NSAIDs for arthritis pain  VTE prophylaxis: SCDs; no  pharmacologic VTE ppx given anemia and possible bleed.  FEN NSL Replete as needed Clear liquid diet   Dispo: Disposition is deferred at this time, awaiting improvement of current medical problems.  Anticipated discharge in approximately 1 day(s).   The patient does have a current PCP Drucilla Schmidt, MD) and does need an [facility] hospital follow-up appointment after discharge.  The patient does not have transportation limitations that hinder transportation to clinic appointments.  .Services Needed at time of discharge: Y = Yes, Blank = No PT:   OT:   RN:   Equipment:   Other:     LOS: 2 days   Nicole Oman, DO 09/14/2014, 10:58 AM

## 2014-09-14 NOTE — Op Note (Signed)
[facility] [place], [address]   ENDOSCOPY PROCEDURE REPORT  PATIENT: Nicole, Mccormick  MR#: [id number] BIRTHDATE: 07-25-1946 , 72  yrs. old GENDER: female ENDOSCOPIST: Arta Silence, MD REFERRED BY:  [facility] PROCEDURE DATE:  01-Oct-2014 PROCEDURE:  EGD, diagnostic ASA CLASS:     Class III INDICATIONS:  diagnostic procedure.   iron-deficiency anemia. MEDICATIONS: Monitored anesthesia care TOPICAL ANESTHETIC:  DESCRIPTION OF PROCEDURE: After the risks benefits and alternatives of the procedure were thoroughly explained, informed consent was obtained.  The [id number] ([id number]) endoscope was introduced through the mouth and advanced to the second portion of the duodenum. The instrument was slowly withdrawn as the mucosa was fully examined.    Findings:  Normal esophagus.  Few small linear erosions in the antrum.  Some fibrotic-appearing changes near pylorus, without gastric outlet obstruction, possibly old ulcer disease.  Couple small AVM's noted in the proximal duodenum.              The scope was then withdrawn from the patient and the procedure completed.  COMPLICATIONS: There were no immediate complications.  ENDOSCOPIC IMPRESSION:     As above.    No obvious source of anemia seen; couple small AVMs in proximal duodenum not sufficient to account for her profound anemia.  RECOMMENDATIONS:     1.  Watch for potential complications of procedure. 2.  Proceed with colonoscopy today.  eSigned:  Arta Silence, MD 01-Oct-2014 12:54 PM   CC:  CPT CODES: ICD CODES:  The ICD and CPT codes recommended by this software are interpretations from the data that the clinical staff has captured with the software.  The verification of the translation of this report to the ICD and CPT codes and modifiers is the sole responsibility of the health care institution and practicing physician where this report was generated.  [place]. will not be held responsible for the validity of the ICD and CPT codes included on this report.  AMA assumes no liability for data contained or not contained herein. CPT is a Designer, television/film set of the [employer].

## 2014-09-14 NOTE — Transfer of Care (Signed)
Immediate Anesthesia Transfer of Care Note  Patient: Nicole Mccormick  Procedure(s) Performed: Procedure(s): ESOPHAGOGASTRODUODENOSCOPY (EGD) (N/A) COLONOSCOPY (N/A)  Patient Location: PACU and Endoscopy Unit  Anesthesia Type:MAC  Level of Consciousness: awake and alert   Airway & Oxygen Therapy: Patient Spontanous Breathing and Patient connected to nasal cannula oxygen  Post-op Assessment: Report given to PACU RN and Post -op Vital signs reviewed and stable  Post vital signs: Reviewed and stable  Complications: No apparent anesthesia complications

## 2014-09-14 NOTE — Interval H&P Note (Signed)
History and Physical Interval Note:  09/14/2014 11:45 AM  Nicole Mccormick  has presented today for surgery, with the diagnosis of iron-deficiency anemia, hemoccult-positive stools.  The various methods of treatment have been discussed with the patient and family. After consideration of risks, benefits and other options for treatment, the patient has consented to  Procedure(s): ESOPHAGOGASTRODUODENOSCOPY (EGD) (N/A) COLONOSCOPY (N/A) as a surgical intervention .  The patient's history has been reviewed, patient examined, no change in status, stable for surgery.  I have reviewed the patient's chart and labs.  Questions were answered to the patient's satisfaction.     Katisha Shimizu M  Assessment:  1.  Iron-deficiency anemia. 2.  Hemoccult-positive stools.  Plan:  1.  Endoscopy and colonoscopy. 2.  Risks (bleeding, infection, bowel perforation that could require surgery, sedation-related changes in cardiopulmonary systems), benefits (identification and possible treatment of source of symptoms, exclusion of certain causes of symptoms), and alternatives (watchful waiting, radiographic imaging studies, empiric medical treatment) of upper endoscopy (EGD) were explained to patient/family in detail and patient wishes to proceed. 3.  Risks (bleeding, infection, bowel perforation that could require surgery, sedation-related changes in cardiopulmonary systems), benefits (identification and possible treatment of source of symptoms, exclusion of certain causes of symptoms), and alternatives (watchful waiting, radiographic imaging studies, empiric medical treatment) of colonoscopy were explained to patient/family in detail and patient wishes to proceed.

## 2014-09-14 NOTE — Anesthesia Postprocedure Evaluation (Signed)
  Anesthesia Post-op Note  Patient: Nicole Mccormick  Procedure(s) Performed: Procedure(s): ESOPHAGOGASTRODUODENOSCOPY (EGD) (N/A) COLONOSCOPY (N/A)  Patient Location: PACU  Anesthesia Type:General  Level of Consciousness: awake, alert  and oriented  Airway and Oxygen Therapy: Patient Spontanous Breathing and Patient connected to nasal cannula oxygen  Post-op Pain: none  Post-op Assessment: Post-op Vital signs reviewed, Patient's Cardiovascular Status Stable and Respiratory Function Stable  Post-op Vital Signs: Reviewed and stable  Last Vitals:  Filed Vitals:   09/14/14 1252  BP: 153/97  Pulse: 54  Temp:   Resp: 14    Complications: No apparent anesthesia complications

## 2014-09-14 NOTE — Anesthesia Preprocedure Evaluation (Addendum)
Anesthesia Evaluation  Patient identified by MRN, date of birth, ID band Patient awake    Reviewed: Allergy & Precautions, H&P , NPO status , Patient's Chart, lab work & pertinent test results  Airway       Dental  (+) Dental Advisory Given, Missing, Edentulous Upper   Pulmonary asthma , Current Smoker,          Cardiovascular hypertension, Pt. on medications  ECHO 4/15 EF 65%   Neuro/Psych    GI/Hepatic GERD-  Medicated,Hep C, HIV   Endo/Other    Renal/GU      Musculoskeletal   Abdominal   Peds  Hematology  (+) anemia , HIV, H/H 8/30   Anesthesia Other Findings   Reproductive/Obstetrics                         Anesthesia Physical Anesthesia Plan  ASA: III  Anesthesia Plan: MAC   Post-op Pain Management:    Induction: Intravenous  Airway Management Planned: Nasal Cannula  Additional Equipment:   Intra-op Plan:   Post-operative Plan:   Informed Consent: I have reviewed the patients History and Physical, chart, labs and discussed the procedure including the risks, benefits and alternatives for the proposed anesthesia with the patient or authorized representative who has indicated his/her understanding and acceptance.     Plan Discussed with:   Anesthesia Plan Comments:         Anesthesia Quick Evaluation

## 2014-09-15 ENCOUNTER — Other Ambulatory Visit: Payer: Self-pay | Admitting: Internal Medicine

## 2014-09-15 ENCOUNTER — Encounter (HOSPITAL_COMMUNITY): Payer: Self-pay | Admitting: Gastroenterology

## 2014-09-15 LAB — CBC
HCT: 25.8 % — ABNORMAL LOW (ref 36.0–46.0)
Hemoglobin: 7.5 g/dL — ABNORMAL LOW (ref 12.0–15.0)
MCH: 22.3 pg — ABNORMAL LOW (ref 26.0–34.0)
MCHC: 29.1 g/dL — ABNORMAL LOW (ref 30.0–36.0)
MCV: 76.8 fL — ABNORMAL LOW (ref 78.0–100.0)
Platelets: 257 10*3/uL (ref 150–400)
RBC: 3.36 MIL/uL — ABNORMAL LOW (ref 3.87–5.11)
RDW: 20.2 % — ABNORMAL HIGH (ref 11.5–15.5)
WBC: 4.9 10*3/uL (ref 4.0–10.5)

## 2014-09-15 LAB — BASIC METABOLIC PANEL
Anion gap: 9 (ref 5–15)
BUN: 16 mg/dL (ref 6–23)
CO2: 25 mEq/L (ref 19–32)
Calcium: 8.7 mg/dL (ref 8.4–10.5)
Chloride: 102 mEq/L (ref 96–112)
Creatinine, Ser: 0.94 mg/dL (ref 0.50–1.10)
GFR calc Af Amer: 70 mL/min — ABNORMAL LOW (ref >90)
GFR calc non Af Amer: 60 mL/min — ABNORMAL LOW (ref >90)
Glucose, Bld: 121 mg/dL — ABNORMAL HIGH (ref 70–99)
Potassium: 4.5 mEq/L (ref 3.7–5.3)
Sodium: 136 mEq/L — ABNORMAL LOW (ref 137–147)

## 2014-09-15 MED ORDER — DARUNAVIR-COBICISTAT 800-150 MG PO TABS: 1.0000 | ORAL_TABLET | Freq: Every day | ORAL | Status: DC

## 2014-09-15 MED ORDER — PANTOPRAZOLE SODIUM 40 MG PO TBEC: 40.0000 mg | DELAYED_RELEASE_TABLET | Freq: Two times a day (BID) | ORAL | Status: DC

## 2014-09-15 MED ORDER — LORATADINE 10 MG PO TABS
10.0000 mg | ORAL_TABLET | Freq: Every day | ORAL | Status: DC
Start: 2014-09-15 — End: 2014-09-15
  Administered 2014-09-15: 10 mg via ORAL
  Filled 2014-09-15: qty 1

## 2014-09-15 MED ORDER — FERROUS SULFATE 325 (65 FE) MG PO TABS: 325.0000 mg | ORAL_TABLET | Freq: Three times a day (TID) | ORAL | Status: DC

## 2014-09-15 MED ORDER — LORATADINE 10 MG PO TABS: 10.0000 mg | ORAL_TABLET | Freq: Every day | ORAL | Status: DC | PRN

## 2014-09-15 NOTE — Discharge Summary (Signed)
Name: Nicole Mccormick MRN: [id number] DOB: [birth year] 68 y.o. PCP: Drucilla Schmidt, MD  Date of Admission: 09/12/2014 10:56 PM Date of Discharge: 09/15/2014 Attending Physician: Michel Bickers, MD  Discharge Diagnosis:   Iron deficiency anemia   Gastroesophageal reflux disease without esophagitis   Essential hypertension, benign   Coronary artery disease   Human immunodeficiency virus (HIV) disease   Hepatitis C   Tobacco use disorder  Discharge Medications:   Medication List    STOP taking these medications       ANTACID PO     atazanavir 200 MG capsule  Commonly known as:  REYATAZ     BAYER BACK & BODY PAIN EX ST PO     famotidine 40 MG tablet  Commonly known as:  PEPCID      TAKE these medications       acyclovir 400 MG tablet  Commonly known as:  ZOVIRAX  Take 400 mg by mouth daily as needed (for flare ups).     amLODipine 10 MG tablet  Commonly known as:  NORVASC  Take 1 tablet (10 mg total) by mouth daily.     aspirin EC 81 MG tablet  Take 1 tablet (81 mg total) by mouth daily.     baclofen 20 MG tablet  Commonly known as:  LIORESAL  Take 20 mg by mouth daily as needed for muscle spasms.     darunavir-cobicistat 800-150 MG per tablet  Commonly known as:  PREZCOBIX  Take 1 tablet by mouth daily. Swallow whole. Do NOT crush, break or chew tablets. Take with food.     emtricitabine-tenofovir 200-300 MG per tablet  Commonly known as:  TRUVADA  Take 1 tablet by mouth daily.     ENSURE NUTRA SHAKE HI-CAL Liqd  Take 1 Can by mouth 2 (two) times daily.     ferrous sulfate 325 (65 FE) MG tablet  Take 1 tablet (325 mg total) by mouth 3 (three) times daily with meals.     gabapentin 600 MG tablet  Commonly known as:  NEURONTIN  Take 600 mg by mouth See admin instructions. Take 600mg  every morning and 600mg  in the evening if needed for pain     isosorbide mononitrate 30 MG 24 hr tablet  Commonly known as:  IMDUR  Take 1 tablet (30 mg total) by mouth daily.     lisinopril-hydrochlorothiazide 20-12.5 MG per tablet  Commonly known as:  PRINZIDE,ZESTORETIC  Take 2 tablets by mouth daily.     loratadine 10 MG tablet  Commonly known as:  CLARITIN  Take 1 tablet (10 mg total) by mouth daily as needed for allergies.     pantoprazole 40 MG tablet  Commonly known as:  PROTONIX  Take 1 tablet (40 mg total) by mouth 2 (two) times daily.     sucralfate 1 G tablet  Commonly known as:  CARAFATE  Take 1 tablet (1 g total) by mouth 4 (four) times daily.        Disposition and follow-up:   Nicole Mccormick was discharged from [facility] in stable condition.  At the hospital follow up visit please address:  1.  Review her antihypertensives. Nicole Mccormick's systolic BP ran in the 644-034 range and was given hydralazine 10 mg tid in addition to her home medications. At discharge it appeared that Nicole Mccormick was only given half her dose of Lisinopril-HCTZ combination (these medications were inadvertently ordered as single doses of Lisinopril 20mg  and HCTZ 12.5mg  instead of the two pills (  lisinopril 40/25mg  total) she normal takes at home.  She admitted that she does not measure BP at home.   2. How she is tolerating her new HAART medication, PREZCOBIX?  She stopped taking REYATAZ as she started taking Protonix.   3. Is Tylenol working for her back pain?  4. Is she taking oral iron supplementation?  5.  Labs / imaging needed at time of follow-up: Dr. Paulita Fujita from [facility] recommended a CBC check in 1-2 weeks.   6.  Pending labs/ test needing follow-up: Dr. Paulita Fujita will follow up with Nicole Mccormick 2-3 weeks from discharge regarding the results.   Follow-up Appointments: Follow-up Information   Follow up with Drucilla Schmidt, MD On 09/22/2014. (for hospital follow-up at 2:15 PM )    Specialty:  Internal Medicine   Contact information:   [place] [address] [phone]      Discharge Instructions: Nicole Mccormick, you were admitted for anemia.  Dr. Paulita Fujita of [facility] performed an endoscopy (look at your esophagus to stomach) and colonscopy (look at your colon). Both did not show any active source of bleeding. The endoscopy did show some small blood vessels that connect your vein and arteries in your small bowel that could potentially contribute to your anemia. Dr. Paulita Fujita will follow up with you in 2-3 weeks to consider further management. It is important to consider that sometimes the procedures may not show any active source of bleeding since the bleeding can be intermittent.  Recommendations that we have gone through:  1) We changed one of your HIV medications. You should stop taking REYATAZ. Start taking PREZCOBIX. Continue taking TRUVADA.  2) Start taking iron tablets three times a day.  3) Start taking Protonix twice a day. Stop taking the Famotidine.  4) Start taking Tylenol for your back pain. Avoid taking Bayer-aspirin, BC powder, or Goody powder because they can irritate your stomach.  5) Measure your blood pressure at home if you can so you can inform Dr. Sherrine Maples when you see her in clinic.  You will follow up with Dr. Sherrine Maples on 09/22/14 at 2:15 PM for a hospital follow-up visit. Please arrive early to clinic.   Discharge Instructions   Call MD for:  difficulty breathing, headache or visual disturbances    Complete by:  As directed      Call MD for:  extreme fatigue    Complete by:  As directed      Call MD for:  persistant dizziness or light-headedness    Complete by:  As directed      Call MD for:  persistant nausea and vomiting    Complete by:  As directed      Call MD for:  severe uncontrolled pain    Complete by:  As directed      Call MD for:  temperature >100.4    Complete by:  As directed      Diet - low sodium heart healthy    Complete by:  As directed      Increase activity slowly    Complete by:  As directed           Consultations: Treatment Team:  Arta Silence, MD (Gastroenterology)  Procedures  Performed:  Dg Chest Port 1 View  09/13/2014   CLINICAL DATA:  Acute onset of diffuse chest pain and shortness of breath. Initial encounter.  EXAM: PORTABLE CHEST - 1 VIEW  COMPARISON:  Chest radiograph performed 08/07/2014  FINDINGS: The lungs are well-aerated and  clear. There is no evidence of focal opacification, pleural effusion or pneumothorax.  The cardiomediastinal silhouette is mildly enlarged. No acute osseous abnormalities are seen.  IMPRESSION: Mild cardiomegaly; no acute cardiopulmonary process seen.   Electronically Signed   By: Garald Balding M.D.   On: 09/13/2014 00:02   Endoscopy 09/14/14:  Findings: Normal esophagus. Few small linear erosions in the antrum. Some fibrotic-appearing changes near pylorus, without gastric outlet obstruction, possibly old ulcer disease. Couple small AVM's noted in the proximal duodenum. The scope was then withdrawn from the Nicole Mccormick and the procedure completed.   ENDOSCOPIC IMPRESSION: As above. No obvious source of anemia seen; couple small AVMs in proximal duodenum not sufficient to account for her profound anemia.   RECOMMENDATIONS: 1. Watch for potential complications of procedure.  2. Proceed with colonoscopy today.  Colonscopy 09/14/14 :  Normal digital rectal exam. Small verrucous fleshy protuberance arising from anal verge, upon retroflexion, previously biopsied (condylomatous), otherwise normal retroflexed view of rectum. Couple small diverticula seen. No other polyps, masses, vascular ectasias, or inflammatory changes were seen. Distal 10cm of the terminal ileum was normal.   RECOMMENDATIONS: 1. Watch for potential complications of procedure.  2. Inpatient capsule endoscopy for further investigation into her iron-deficiency anemia.  3. [facility] will follow.   Small capsule endoscopy performed on 10/7. Preliminary results as per Dr. Erlinda Hong note says that there is evidence of proximal small bowel AVMs and that there is a possibility that this  could have contributed to Nicole Mccormick's anemia. Full read to follow.   Hospital Course by problem list: Principal Problem:   Iron deficiency anemia Active Problems:   Human immunodeficiency virus (HIV) disease   TOBACCO ABUSE   Gastroesophageal reflux disease without esophagitis   Essential hypertension, benign   CAD (coronary artery disease)   Hepatitis C   Acute on chronic iron deficiency anemia possibly secondary to AVMs: Nicole Mccormick was admitted from [facility] after her blood draw revealed a Hgb of 5.7.  She was asymptomatic at admission and during her stay but was admitted for work-up to determine etiology.  She received 1 unit of pRBCs and Hgb trended up to 7.0. Nicole Mccormick's Hgb remained stable throughout the course of the hospitalization from 8.2 to 8.5 and was 7.5 on day of discharge. On admission, Nicole Mccormick denied any active source of bleeding and also denied any symptoms and remained asymptomatic at discharge. She mentioned taking Bayer-aspirin, BC powder, and Goody's powder at least 4-5 times per day for her chronic back pain and that she had not been taking her iron supplementation on a consistent basis.   Dr. Paulita Fujita at [facility] was consulted given high suspicion that the source of bleeding was from her GI tract. Endoscopy and colonscopy performed on 10/7 both did not reveal any active source of bleeding. Endoscopy revealed small AVMs and capsule endoscopy preliminary results showed that there is evidence of proximal small bowel AVMs.  There is a possibility that this could have contributed to Nicole Mccormick's anemia. The Nicole Mccormick resumed taking iron supplementation during her stay and reported tolerating it well. We urged Nicole Mccormick to take her iron supplementation three times a day and she says she will comply.   HIV: Nicole Mccormick's last CD4 count on 10/05 was 620. Her home atazanavir (Reyataz) was discontinued given pharmacy's recommendation that as Nicole Mccormick was started on Protonix (based on the suspicion for GI  bleed causing her anemia), its effectiveness may be reduced. Thus, it was switched to darunavir-cobicistat (Prezcobix). An infectious disease pharmacist had  offered education on this new medication.   GERD: She was started on Protonix at admission and will continue this at discharge. Nicole Mccormick's home famotidine discontinued on discharge.   Hypertension : Blood pressure was in the 528-413K systolic during hospitalization and then went to 150s after adding hydralazine 10 mg tid. At discharge, during medication reconciliation, it became evident that only have of her home dose of lisinopril-HCTZ was ordered at admission (20-12.5 rather than 40-25 mg) which could have contributed to her elevated BPs during her hospitalization. She will continue on her home medications at discharge. She will follow up with Dr. Sherrine Maples in clinic on 09/22/14 for BP check.   CAD : Nicole Mccormick has a reported history of MI without stenting or CABG. Echo 03/09/14 showed EF of 65-70% with grade 1 diastolic dysfunction and mild mitral regurgitation. We continued her home aspirin on discharge given her CVD risk of 12.5% and benefits of secondary ASCVD prevention outweigh risk of bleed in this situation.  Furthermore, she has been started on a PPI to help reduce risk of recurrent re-bleed.  HCV: Nicole Mccormick lab's showed her HCV antibody to be positive in addition to her LFTs.  Dr. Johnnye Sima re-ordered HCV tests on 10/5 and plans to refer her for treatment. Her viral load is 3.2 million compared to 1.78 million 6 years ago. HCV Quantitative log is 6.51.    Chronic back pain: This is likely secondary to arthiritis given that her CT chest from 2012 showed thoracic kyphosis and scoliosis with mild anterior wedging mid thoracic vertebra without bony destructive lesion. We continued her home Baclofen as needed and gabapentin. Nicole Mccormick reported that she took [facility] and Goody's powder 4-5 times per day for her back pain. Given her anemia and risk of gastric  ulcers, we counseled Nicole Mccormick to stop taking these medications and start taking Tylenol as needed for pain control.   Discharge Vitals:   BP 159/54  Pulse 58  Temp(Src) 97.9 F (36.6 C) (Oral)  Resp 14  Ht 5\' 2"  (1.575 m)  Wt 58.968 kg (130 lb)  BMI 23.77 kg/m2  SpO2 98%  Discharge Labs:  Results for orders placed during the hospital encounter of 09/12/14 (from the past 24 hour(s))  BASIC METABOLIC PANEL     Status: Abnormal   Collection Time    09/15/14  4:11 AM      Result Value Ref Range   Sodium 136 (*) 137 - 147 mEq/L   Potassium 4.5  3.7 - 5.3 mEq/L   Chloride 102  96 - 112 mEq/L   CO2 25  19 - 32 mEq/L   Glucose, Bld 121 (*) 70 - 99 mg/dL   BUN 16  6 - 23 mg/dL   Creatinine, Ser 0.94  0.50 - 1.10 mg/dL   Calcium 8.7  8.4 - 10.5 mg/dL   GFR calc non Af Amer 60 (*) >90 mL/min   GFR calc Af Amer 70 (*) >90 mL/min   Anion gap 9  5 - 15  CBC     Status: Abnormal   Collection Time    09/15/14  4:11 AM      Result Value Ref Range   WBC 4.9  4.0 - 10.5 K/uL   RBC 3.36 (*) 3.87 - 5.11 MIL/uL   Hemoglobin 7.5 (*) 12.0 - 15.0 g/dL   HCT 25.8 (*) 36.0 - 46.0 %   MCV 76.8 (*) 78.0 - 100.0 fL   MCH 22.3 (*) 26.0 - 34.0 pg   MCHC 29.1 (*)  30.0 - 36.0 g/dL   RDW 20.2 (*) 11.5 - 15.5 %   Platelets 257  150 - 400 K/uL    Signed: Josiah Lobo, Med Student 09/15/2014, 3:16 PM    Services Ordered on Discharge: None Equipment Ordered on Discharge: None   Duwaine Maxin, DO IMTS, PGY2 09/16/2014

## 2014-09-15 NOTE — Progress Notes (Signed)
Antiretroviral Consult  68 yo who has been on boosted atazanavir and truvada for a while. Atazanavir has some significant interaction with atazanavir so the team has decided to change it to prezcobix. Previously, she couldn't tolerate ritonavir. After talking to her, it seems like the side effects was from when she was taking therapeutic ritonavir dose back then. Educated her on the new med 10/7.   Cont prezcobix and Nicole Mccormick, PharmD Pager: [phone] 09/15/2014 10:36 AM

## 2014-09-15 NOTE — Discharge Instructions (Signed)
[name], you were admitted for anemia. Dr. Paulita Fujita of [facility] performed an endoscopy (look at your esophagus to stomach) and colonscopy (look at your colon). Both did not show any active source of bleeding. The endoscopy show some small blood vessels that connect your vein and arteries in your small bowel that could potentially contribute to your anemia. Dr. Paulita Fujita will follow up with you in 2-3 weeks to consider further management. It is important to consider that sometimes the procedures may not show any active source of bleeding since the bleeding can be intermittent.   Recommendations that we have gone through: 1) We changed one of your HIV medications. You should stop taking REYATAZ. Start taking PREZCOBIX. Continue taking TRUVADA.  2) Start taking iron tablets three times a day.  3) Start taking Protonix twice a day. Stop taking the Famotidine.  4) Start taking Tylenol for your back pain. Avoid taking Bayer-aspirin, BC powder, or Goody powder because they can irritate your stomach.  5) Measure your blood pressure at home if you can so you can inform Dr. Sherrine Maples when you see her in clinic.   You will follow up with Dr. Sherrine Maples on 09/22/14 at 2:15 PM for a hospital follow-up visit. Please arrive early to clinic.   Iron Deficiency Anemia Anemia is when you have a low number of healthy red blood cells. It is often caused by too little iron. This is called iron deficiency anemia. It may make you tired and short of breath. HOME CARE   Take iron as told by your doctor.  Take vitamins as told by your doctor.  Eat foods that have iron in them. This includes liver, lean beef, whole-grain bread, eggs, dried fruit, and dark green leafy vegetables. GET HELP RIGHT AWAY IF:  You pass out (faint).  You have chest pain.  You feel sick to your stomach (nauseous) or throw up (vomit).  You get very short of breath with activity.  You are weak.  You have a fast heartbeat.  You start to sweat for  no reason.  You become light-headed when getting up from a chair or bed. MAKE SURE YOU:  Understand these instructions.  Will watch your condition.  Will get help right away if you are not doing well or get worse. Document Released: 12/28/2010 Document Revised: 11/30/2013 Document Reviewed: 08/02/2013 [facility] Patient Information 2015 [place], [place]. This information is not intended to replace advice given to you by your health care provider. Make sure you discuss any questions you have with your health care provider.

## 2014-09-15 NOTE — Progress Notes (Signed)
Subjective: Doing well; wants to go home  Objective: Vital signs in last 24 hours: Temp:  [97.9 F (36.6 C)-98.7 F (37.1 C)] 97.9 F (36.6 C) (10/08 1217) Pulse Rate:  [52-58] 58 (10/08 1217) BP: (110-229)/(40-95) 159/54 mmHg (10/08 1420) SpO2:  [96 %-98 %] 98 % (10/08 1217) Weight change:  Last BM Date: 09/14/14  PE: GEN:  NAD  Lab Results: CBC    Component Value Date/Time   WBC 4.9 09/15/2014 0411   RBC 3.36* 09/15/2014 0411   RBC 2.70* 09/13/2014 0002   HGB 7.5* 09/15/2014 0411   HCT 25.8* 09/15/2014 0411   PLT 257 09/15/2014 0411   MCV 76.8* 09/15/2014 0411   MCH 22.3* 09/15/2014 0411   MCHC 29.1* 09/15/2014 0411   RDW 20.2* 09/15/2014 0411   LYMPHSABS 1.6 09/12/2014 2229   MONOABS 0.6 09/12/2014 2229   EOSABS 0.1 09/12/2014 2229   BASOSABS 0.0 09/12/2014 2229   Assessment:  1.  Iron-deficiency anemia. 2.  Hemoccult-positive stools.   Plan:  1.  Suspect patient's hemoccult-positive anemia is, at least in part, due to AVMs.  I have discussed with Dr. Watt Climes, who is reading her capsule endoscopy report, and there was evidence of proximal small bowel AVMs, perhaps accessible by enteroscope. 2.  Would hold off on NSAIDs.  Would stay on PPI indefinitely.  Continue oral iron supplementation upon hospital discharge. 3.  OK to discharge home today, from GI perspective.  Would need CBC rechecked within the next 1-2 weeks with PCP, and I will arrange follow-up with her in office in 2-3 weeks.  We can discuss options (push enteroscopy versus referral to [facility] for double balloon enteroscopy) with patient in more detail at that time. 4.  Will sign-off; please call with questions; thank you for the consultation.   Nicole Mccormick 09/15/2014, 2:48 PM

## 2014-09-15 NOTE — Progress Notes (Signed)
  I have seen and examined the patient, and reviewed the daily progress note by Josiah Lobo, MS4 and discussed the care of the patient with them. Please see my progress note from 09/15/2014 for further details regarding assessment and plan.    Signed:  [name], DO 09/15/2014, 3:56 PM

## 2014-09-15 NOTE — Progress Notes (Addendum)
Subjective: Nicole Mccormick was seen and examined this morning.  She is feeling well and wants to go home.  Objective: Vital signs in last 24 hours: Filed Vitals:   09/15/14 0751 09/15/14 1103 09/15/14 1217 09/15/14 1420  BP: 142/60 181/59 190/60 159/54  Pulse: 52  58   Temp: 98.5 F (36.9 C)  97.9 F (36.6 C)   TempSrc: Oral  Oral   Resp:      Height:      Weight:      SpO2: 97%  98%    Weight change:   Intake/Output Summary (Last 24 hours) at 09/15/14 1351 Last data filed at 09/15/14 1100  Gross per 24 hour  Intake   1180 ml  Output      0 ml  Net   1180 ml   Vitals reviewed. General: sitting up in bed, in NAD Cardiac: RRR Pulm: clear to auscultation bilaterally Abd: soft, nontender, nondistended, BS present Ext: warm and well perfused, no pedal edema Neuro: alert and oriented X3, moves all extremities voluntairly  Lab Results:  CBC:  Recent Labs Lab 09/12/14 1101 09/12/14 2229  09/14/14 0001 09/15/14 0411  WBC 4.7 4.6  < > 4.7 4.9  NEUTROABS 2.6 2.3  --   --   --   HGB 6.2* 5.7*  < > 8.6* 7.5*  HCT 22.4* 20.3*  < > 29.8* 25.8*  MCV 73.9* 73.8*  < > 77.2* 76.8*  PLT 305 276  < > 286 257  < > = values in this interval not displayed.   Medications: I have reviewed the patient's current medications. Scheduled Meds: . amLODipine  10 mg Oral Daily  . aspirin EC  81 mg Oral Daily  . darunavir-cobicistat  1 tablet Oral Daily  . emtricitabine-tenofovir  1 tablet Oral Q breakfast  . feeding supplement (ENSURE COMPLETE)  1 Container Oral BID  . ferrous sulfate  325 mg Oral TID WC  . gabapentin  600 mg Oral BID  . hydrALAZINE  25 mg Oral 3 times per day  . hydrochlorothiazide  12.5 mg Oral Daily  . isosorbide mononitrate  30 mg Oral Daily  . lisinopril  20 mg Oral Daily  . loratadine  10 mg Oral Daily  . sodium chloride  3 mL Intravenous Q12H  . sucralfate  1 g Oral QID   Continuous Infusions: . sodium chloride     PRN  Meds:.baclofen  Assessment/Plan: 68 year old woman with PMH of HIV disease (well controlled), CAD, HTN, GERD, HCV, presenting with anemia with Hgb of 5.7.   Iron deficiency anemia:  Likely worsened by increased NSAID use (ASA, BC powder and Goody powder).  She is asymptomatic and hgb is stable.  EGD revealed small AVMs in proximal duodenum which may have contributed to anemia but is likely not the lone cause.  There was no source identified on colonoscopy.  [name] underwent capsule endoscopy today and results are pending.  GI agrees with discharge and outpatient follow-up.   - continue PPI BID  - continue ferrous sulfate TID with meals - she says she has tolerated this in the hospital and she agrees to take it - she agrees to stop NSAIDs and start Tylenol prn for chronic low back pain - follow-up has been arranged with PCP in [facility] on 09/22/14 - we will arrange GI follow-up  HIV: well controlled with last CD4 count of 510 in 07/11/14 with VL undetectable in 03/01/14. - atazavanir was discontinued during admission due  to possible interaction with PPI - continue atazavanir to darunavir- cobicistat (Prezcobix) to avoid interaction with PPI  - continue home Truvada  CAD: Stable. Has hx of MI but no CABG. Continue low dose ASA for secondary prevention since her estimated ASCVD risk is 12.1% and benefits of secondary CVD prevention likely outweigh risk of bleed.  One study of older patients on ASA with GI complications notes restarting low dose ASA with a PPI reduced risk of recurrent re-bleed within 1 year.  HTN: Stable.  She in on amlodipine 10mg  daily, lisinopril-HCTZ 20-12.5 (two pills daily); these meds were continued. Hydralazine TID was started in the hospital for further BP control.  Upon close review of PTA medications it is apparent that instead of ordering HCTZ 25 and lisinopril 40 these medications were ordered as 12.5 and 20mg  doses inadvertently.  This explains why her pressure were not as  well controlled. - resume Prinzide 20-12.5mg  (two pills) daily at discharge - continue amlodipine 10mg  daily - assess BP control at hospital follow-up  GERD: Continue home sucralfate, started protonix BID.   Chronic back pain: Thought to be secondary to arthritis with CT chest from 2012 showing thoracic kyphosis and scoliosis with mild anterior wedging mid thoracic vertebra without bony destructive lesion. - continue home Baclofen and gabapentin - she agrees to stop NSAIDs and try Tylenol prn for pain  VTE prophylaxis: SCDs; no pharmacologic VTE ppx given anemia and possible bleed.  FEN NSL Replete as needed Clear liquid diet  Dispo: She asymptomatic and would like to go home today.  She is stable for discharge home with follow-up in [facility] and with GI.  The patient does have a current PCP Drucilla Schmidt, MD) and does need an [facility] hospital follow-up appointment after discharge.  The patient does not have transportation limitations that hinder transportation to clinic appointments.  .Services Needed at time of discharge: Y = Yes, Blank = No PT:   OT:   RN:   Equipment:   Other:     LOS: 3 days   Nicole Oman, DO 09/15/2014, 1:51 PM

## 2014-09-15 NOTE — Progress Notes (Signed)
Subjective: Ms Carnevale was seen this morning in her room. She had just completed the small-capsule endoscopy procedure.. She had no complaints and continued to deny any dyspnea, fatigue, and dizziness. She was willing to leave today and understands that she can start taking Tylenol to treat her back pain. She also complained of runny nose and has taken Zyrtec before which has helped.   Objective: Vital signs in last 24 hours: Filed Vitals:   09/15/14 0400 09/15/14 0751 09/15/14 1103 09/15/14 1217  BP: 158/55 142/60 181/59 190/60  Pulse:  52  58  Temp:  98.5 F (36.9 C)  97.9 F (36.6 C)  TempSrc:  Oral  Oral  Resp:      Height:      Weight:      SpO2:  97%  98%   Weight change:   Intake/Output Summary (Last 24 hours) at 09/15/14 1338 Last data filed at 09/15/14 1100  Gross per 24 hour  Intake   1180 ml  Output      0 ml  Net   1180 ml   Vitals reviewed.  General: resting in bed, in NAD  HEENT no scleral icterus  Cardiac: RRR, no rubs, no murmurs, no gallops  Pulm: clear to auscultation bilaterally, rhonchi heard in upper and lower lung lobes Back: slightly kyphotic  Abd: soft, nontender, nondistended, BS present  Ext: warm and well perfused, no pedal edema  Neuro: alert and oriented X3, moves all extremities voluntairly  Lab Results: Results for COURTNAY, PETRILLA (MRN [id number]) as of 09/15/2014 13:41  Ref. Range 09/15/2014 04:11  Sodium Latest Range: 137-147 mEq/L 136 (L)  Potassium Latest Range: 3.7-5.3 mEq/L 4.5  Chloride Latest Range: 96-112 mEq/L 102  CO2 Latest Range: 19-32 mEq/L 25  BUN Latest Range: 6-23 mg/dL 16  Creatinine Latest Range: 0.50-1.10 mg/dL 0.94  Calcium Latest Range: 8.4-10.5 mg/dL 8.7  GFR calc non Af Amer Latest Range: >90 mL/min 60 (L)  GFR calc Af Amer Latest Range: >90 mL/min 70 (L)  Glucose Latest Range: 70-99 mg/dL 121 (H)  Anion gap Latest Range: 5-15  9  WBC Latest Range: 4.0-10.5 K/uL 4.9  RBC Latest Range: 3.87-5.11 MIL/uL 3.36 (L)    Hemoglobin Latest Range: 12.0-15.0 g/dL 7.5 (L)  HCT Latest Range: 36.0-46.0 % 25.8 (L)  MCV Latest Range: 78.0-100.0 fL 76.8 (L)  MCH Latest Range: 26.0-34.0 pg 22.3 (L)  MCHC Latest Range: 30.0-36.0 g/dL 29.1 (L)  RDW Latest Range: 11.5-15.5 % 20.2 (H)  Platelets Latest Range: 150-400 K/uL 257   Studies/Results:  Endoscopy 09/14/14 Findings: Normal esophagus. Few small linear erosions in the antrum. Some fibrotic-appearing changes near pylorus, without gastric outlet obstruction, possibly old ulcer disease. Couple small AVM's noted in the proximal duodenum. The scope was then withdrawn from the patient and the procedure completed. COMPLICATIONS: There were no immediate complications.  ENDOSCOPIC IMPRESSION: As above. No obvious source of anemia seen; couple small AVMs in proximal duodenum not sufficient to account for her profound anemia. RECOMMENDATIONS: 1. Watch for potential complications of procedure.  Colonscopy 09/14/14 Normal digital rectal exam. Small verrucous fleshy protuberance arising from anal verge, upon retroflexion, previously biopsied (condylomatous), otherwise normal retroflexed view of rectum. Couple small diverticula seen. No other polyps, masses, vascular ectasias, or inflammatory changes were seen. Distal 10cm of the terminal ileum was normal. Withdrawal time was . The scope was withdrawn and the procedure completed.  RECOMMENDATIONS: 1. Watch for potential complications of procedure. 2. Inpatient capsule endoscopy for further investigation into her iron-deficiency  anemia. 3. [facility] will follow.  Medications:  Scheduled Meds: . amLODipine  10 mg Oral Daily  . aspirin EC  81 mg Oral Daily  . darunavir-cobicistat  1 tablet Oral Daily  . emtricitabine-tenofovir  1 tablet Oral Q breakfast  . feeding supplement (ENSURE COMPLETE)  1 Container Oral BID  . ferrous sulfate  325 mg Oral TID WC  . gabapentin  600 mg Oral BID  . hydrALAZINE  25 mg Oral 3 times per  day  . hydrochlorothiazide  12.5 mg Oral Daily  . isosorbide mononitrate  30 mg Oral Daily  . lisinopril  20 mg Oral Daily  . loratadine  10 mg Oral Daily  . sodium chloride  3 mL Intravenous Q12H  . sucralfate  1 g Oral QID   Continuous Infusions: . sodium chloride     PRN Meds:.baclofen  Assessment/Plan: Principal Problem:   Iron deficiency anemia Active Problems:   Human immunodeficiency virus (HIV) disease   TOBACCO ABUSE   Gastroesophageal reflux disease without esophagitis   Essential hypertension, benign   CAD (coronary artery disease)   Hepatitis C  Ms. Lye is a 68 year old female with a history of HIV (VL undetectable, CD4 of 620 on 10/05), iron deficiency anemia, HCV, and GERD who presented with anemia with a Hgb of 5.2.   #Acute on chronic iron deficiency anemia: Hgb stable this morning at 7.5. Patient continues to be asymptomatic. Endoscopy and colonscopy did not reveal any active source of bleeding. Endoscopy revealed small AVMs but were not concerning for causing patient's anemia. Patient finished small-capsule endoscopy today and as per Dr. Erlinda Hong  note, a preliminary note shows that there is evidence of AVMs in her small bowel that is concerning as a possible source for her anemia. One must consider that intermittent bleeding from a GI source may cause patient's anemia given her precipitous decline in hemoglobin in a span of five months. Patient resumed taking iron supplementation during her stay and reported tolerating it well.  --appreciated Dr. Erlinda Hong involvement and for performing the endoscopy, colonoscopy, and small capsule endoscopy --Dr. Paulita Fujita has cleared patient for discharge and will follow-up on the small capsule results and further management such as hemotologic investigation if needed --urged patient to continue taking iron supplementation on a daily basis as she tolerated it well during the admission   #HIV: well-controlled, last CD4 count on 10/05  was 620.  --continue home Truvada  --Switched from atazanavir to darunavir given that it will be less affected if patient gets discharged on PPI compared to atazanavir   --appreciate pharmacist's efforts to educate patient regarding the darunavir   #GERD: stable  --appreciate pharmacy's recommendation that use of PPI might decrease effectiveness of patient's atazanavir  --Continue home sucralfate and discontinue famotidine on discharge as patient is now on PPI   #Hypertension : Blood pressure elevated in ER and continues to be 973-532D systolic during admission. Patient reports compliance to her antihypertensives at home. It was recognized that patient was given half her dose of lisinopril-HCTZ during her hospitalization which contributed to her elevated BPs.  --Continue home amlodipine, Imdur, and Prinzide.  --Discontinue hydralazine on discharge  --If BP >200/100, consider giving hydralazine 2 mg IV  --will follow up with Dr. Sherrine Maples in clinic on 09/22/14 to manage her antihypertensives    #CAD: Reports history of MI without stenting or CABG. Echo 03/09/14 showed EF of 65-70% with grade 1 DD and mild mitral regurgitation.  --Continue home aspirin but discouraged  the use of aspirin for her back pain   #HCV: Antibody positive with elevated LFTs. Dr. Johnnye Sima re-ordered HCV tests yesterday and plans to refer her for treatment. VL is 3.2 million compared to 1.78 million 6 years ago. HCV Quantitative log is 6.51. Patient followed by Dr. Johnnye Sima.  [X]  Hep C genotype [X]  Hep C RNA load  #Chronic back pain: This is likely secondary to arthiritis given that her CT chest from 2012 showed thoracic kyphosis and scoliosis with mild anterior wedging mid thoracic vertebra without bony destructive lesion.  --Continue home Baclofen and gabapentin  --Discouraged the use of Bayer aspirin, BC Powder, and Goody powder  --Recommended patient to start Tylenol on discharge for pain control and follow-up regarding  further pain management with PCP  #VTE prophylaxis: SCDs   This is a Careers information officer Note.  The care of the patient was discussed with Dr. Megan Salon and Dr. Redmond Pulling and the assessment and plan formulated with their assistance.  Please see their attached note for official documentation of the daily encounter.   LOS: 3 days   Josiah Lobo, Med Student 09/15/2014, 1:38 PM

## 2014-09-15 NOTE — Care Management Note (Signed)
    Page 1 of 1   09/15/2014     9:56:12 AM CARE MANAGEMENT NOTE 09/15/2014  Patient:  Nicole Mccormick, Nicole Mccormick   Account Number:  [id number]  Date Initiated:  09/15/2014  Documentation initiated by:  Elissa Hefty  Subjective/Objective Assessment:   adm w anemia     Action/Plan:   lives w fam, pcp dr Emmaline Kluver   Anticipated DC Date:  09/15/2014   Anticipated DC Plan:  HOME/SELF CARE         Choice offered to / List presented to:             Status of service:   Medicare Important Message given?  YES (If response is "NO", the following Medicare IM given date fields will be blank) Date Medicare IM given:  09/15/2014 Medicare IM given by:  Elissa Hefty Date Additional Medicare IM given:   Additional Medicare IM given by:    Discharge Disposition:  HOME/SELF CARE  Per UR Regulation:  Reviewed for med. necessity/level of care/duration of stay  If discussed at [place] of Stay Meetings, dates discussed:    Comments:

## 2014-09-15 NOTE — Progress Notes (Signed)
No clear source of iron-deficiency anemia, hemoccult-positive stool on endoscopy and colonoscopy.  Awaiting capsule endoscopy results, results back later today or tomorrow morning.  Next step in management pending capsule results.

## 2014-09-16 ENCOUNTER — Other Ambulatory Visit: Payer: Self-pay | Admitting: *Deleted

## 2014-09-16 MED ORDER — ACYCLOVIR 400 MG PO TABS: 400.0000 mg | ORAL_TABLET | Freq: Every day | ORAL | Status: DC | PRN

## 2014-09-19 ENCOUNTER — Emergency Department (HOSPITAL_COMMUNITY)
Admission: EM | Admit: 2014-09-19 | Discharge: 2014-09-19 | Disposition: A | Discharge status: Home / Self Care | Payer: PRIVATE HEALTH INSURANCE | Attending: Emergency Medicine | Admitting: Emergency Medicine

## 2014-09-19 ENCOUNTER — Encounter (HOSPITAL_COMMUNITY): Payer: Self-pay | Admitting: Emergency Medicine

## 2014-09-19 DIAGNOSIS — Z8673 Personal history of transient ischemic attack (TIA), and cerebral infarction without residual deficits: Secondary | ICD-10-CM | POA: Insufficient documentation

## 2014-09-19 DIAGNOSIS — Z8619 Personal history of other infectious and parasitic diseases: Secondary | ICD-10-CM | POA: Diagnosis not present

## 2014-09-19 DIAGNOSIS — I10 Essential (primary) hypertension: Secondary | ICD-10-CM | POA: Diagnosis not present

## 2014-09-19 DIAGNOSIS — J45909 Unspecified asthma, uncomplicated: Secondary | ICD-10-CM | POA: Insufficient documentation

## 2014-09-19 DIAGNOSIS — D509 Iron deficiency anemia, unspecified: Secondary | ICD-10-CM | POA: Insufficient documentation

## 2014-09-19 DIAGNOSIS — Z720 Tobacco use: Secondary | ICD-10-CM | POA: Insufficient documentation

## 2014-09-19 DIAGNOSIS — G40909 Epilepsy, unspecified, not intractable, without status epilepticus: Secondary | ICD-10-CM | POA: Insufficient documentation

## 2014-09-19 DIAGNOSIS — I2510 Atherosclerotic heart disease of native coronary artery without angina pectoris: Secondary | ICD-10-CM | POA: Diagnosis not present

## 2014-09-19 DIAGNOSIS — K219 Gastro-esophageal reflux disease without esophagitis: Secondary | ICD-10-CM | POA: Insufficient documentation

## 2014-09-19 DIAGNOSIS — Z7982 Long term (current) use of aspirin: Secondary | ICD-10-CM | POA: Diagnosis not present

## 2014-09-19 DIAGNOSIS — M1990 Unspecified osteoarthritis, unspecified site: Secondary | ICD-10-CM | POA: Diagnosis not present

## 2014-09-19 DIAGNOSIS — I509 Heart failure, unspecified: Secondary | ICD-10-CM | POA: Insufficient documentation

## 2014-09-19 DIAGNOSIS — R110 Nausea: Secondary | ICD-10-CM | POA: Diagnosis not present

## 2014-09-19 DIAGNOSIS — R42 Dizziness and giddiness: Secondary | ICD-10-CM | POA: Diagnosis not present

## 2014-09-19 DIAGNOSIS — Z880 Allergy status to penicillin: Secondary | ICD-10-CM | POA: Insufficient documentation

## 2014-09-19 DIAGNOSIS — R011 Cardiac murmur, unspecified: Secondary | ICD-10-CM | POA: Insufficient documentation

## 2014-09-19 DIAGNOSIS — Z21 Asymptomatic human immunodeficiency virus [HIV] infection status: Secondary | ICD-10-CM | POA: Diagnosis not present

## 2014-09-19 DIAGNOSIS — Z79899 Other long term (current) drug therapy: Secondary | ICD-10-CM | POA: Diagnosis not present

## 2014-09-19 LAB — COMPREHENSIVE METABOLIC PANEL
ALT: 37 U/L — ABNORMAL HIGH (ref 0–35)
AST: 38 U/L — ABNORMAL HIGH (ref 0–37)
Albumin: 3.5 g/dL (ref 3.5–5.2)
Alkaline Phosphatase: 83 U/L (ref 39–117)
Anion gap: 13 (ref 5–15)
BUN: 31 mg/dL — ABNORMAL HIGH (ref 6–23)
CO2: 25 mEq/L (ref 19–32)
Calcium: 8.9 mg/dL (ref 8.4–10.5)
Chloride: 102 mEq/L (ref 96–112)
Creatinine, Ser: 1.09 mg/dL (ref 0.50–1.10)
GFR calc Af Amer: 58 mL/min — ABNORMAL LOW (ref >90)
GFR calc non Af Amer: 50 mL/min — ABNORMAL LOW (ref >90)
Glucose, Bld: 115 mg/dL — ABNORMAL HIGH (ref 70–99)
Potassium: 3.7 mEq/L (ref 3.7–5.3)
Sodium: 140 mEq/L (ref 137–147)
Total Bilirubin: 0.3 mg/dL (ref 0.3–1.2)
Total Protein: 8.2 g/dL (ref 6.0–8.3)

## 2014-09-19 LAB — URINALYSIS, ROUTINE W REFLEX MICROSCOPIC
Bilirubin Urine: NEGATIVE
Glucose, UA: NEGATIVE mg/dL
Hgb urine dipstick: NEGATIVE
Ketones, ur: NEGATIVE mg/dL
Nitrite: NEGATIVE
Protein, ur: NEGATIVE mg/dL
Specific Gravity, Urine: 1.01 (ref 1.005–1.030)
Urobilinogen, UA: 0.2 mg/dL (ref 0.0–1.0)
pH: 5.5 (ref 5.0–8.0)

## 2014-09-19 LAB — URINE MICROSCOPIC-ADD ON

## 2014-09-19 LAB — CBC WITH DIFFERENTIAL/PLATELET
Basophils Absolute: 0.1 10*3/uL (ref 0.0–0.1)
Basophils Relative: 1 % (ref 0–1)
Eosinophils Absolute: 0 10*3/uL (ref 0.0–0.7)
Eosinophils Relative: 0 % (ref 0–5)
HCT: 29.1 % — ABNORMAL LOW (ref 36.0–46.0)
Hemoglobin: 8.4 g/dL — ABNORMAL LOW (ref 12.0–15.0)
Lymphocytes Relative: 21 % (ref 12–46)
Lymphs Abs: 1.2 10*3/uL (ref 0.7–4.0)
MCH: 23.4 pg — ABNORMAL LOW (ref 26.0–34.0)
MCHC: 28.9 g/dL — ABNORMAL LOW (ref 30.0–36.0)
MCV: 81.1 fL (ref 78.0–100.0)
Monocytes Absolute: 0.5 10*3/uL (ref 0.1–1.0)
Monocytes Relative: 8 % (ref 3–12)
Neutro Abs: 4.1 10*3/uL (ref 1.7–7.7)
Neutrophils Relative %: 70 % (ref 43–77)
Platelets: 268 10*3/uL (ref 150–400)
RBC: 3.59 MIL/uL — ABNORMAL LOW (ref 3.87–5.11)
RDW: 25 % — ABNORMAL HIGH (ref 11.5–15.5)
WBC: 5.9 10*3/uL (ref 4.0–10.5)

## 2014-09-19 MED ORDER — ONDANSETRON HCL 4 MG/2ML IJ SOLN
4.0000 mg | Freq: Once | INTRAMUSCULAR | Status: AC
  Administered 2014-09-19: 4 mg via INTRAVENOUS
  Filled 2014-09-19: qty 2

## 2014-09-19 MED ORDER — ONDANSETRON 4 MG PO TBDP: ORAL_TABLET | ORAL | Status: DC

## 2014-09-19 MED ORDER — SODIUM CHLORIDE 0.9 % IV BOLUS (SEPSIS)
250.0000 mL | Freq: Once | INTRAVENOUS | Status: AC
  Administered 2014-09-19: 250 mL via INTRAVENOUS

## 2014-09-19 NOTE — ED Provider Notes (Signed)
CSN: [id number]     Arrival date & time 09/19/14  0700 History   First MD Initiated Contact with Patient 09/19/14 0703     Chief Complaint  Patient presents with  . Nausea  . Dizziness     (Consider location/radiation/quality/duration/timing/severity/associated sxs/prior Treatment) Patient is a 68 y.o. female presenting with dizziness. The history is provided by the patient.  Dizziness Quality:  Lightheadedness Severity:  Mild Onset quality:  Sudden Timing:  Intermittent Progression:  Unchanged Chronicity:  New Context comment:  Possibly SE of new medication Relieved by: rest. Exacerbated by: taking new med. Ineffective treatments:  None tried Associated symptoms: nausea   Associated symptoms: no chest pain, no diarrhea, no headaches, no shortness of breath and no vomiting     Past Medical History  Diagnosis Date  . Hypertension   . Abnormal Pap smear   . Asthma   . Coronary artery disease   . HIV infection     diagnosed before 2008  . Varicosities   . Seizures     last sz fri nov 2  . GERD (gastroesophageal reflux disease)     previously on aciphex, discontinued november 2012  because patient asymptomatic, and concern about interference with HIV meds.  May try pepcid in the future if symptoms return  . Iron deficiency anemia     Ferritin = 2 in november 2012, started on iron supplemenation  . Arthritis   . CHF (congestive heart failure)   . Heart murmur   . Hepatitis C antibody test positive    Past Surgical History  Procedure Laterality Date  . Abdominal hysterectomy    . Esophagogastroduodenoscopy  10/13/11    small hiatal hernia  . Colonoscopy  10/13/11    small adenoma, anal condyloma  . Colonoscopy  10/13/2011    Procedure: COLONOSCOPY;  Surgeon: Gatha Mayer, MD;  Location: [facility];  Service: Endoscopy;  Laterality: N/A;  . Esophagogastroduodenoscopy  10/13/2011    Procedure: ESOPHAGOGASTRODUODENOSCOPY (EGD);  Surgeon: Gatha Mayer, MD;  Location:  [facility] ENDOSCOPY;  Service: Endoscopy;  Laterality: N/A;  . Esophagogastroduodenoscopy N/A 09/14/2014    Procedure: ESOPHAGOGASTRODUODENOSCOPY (EGD);  Surgeon: Arta Silence, MD;  Location: [facility] ENDOSCOPY;  Service: Endoscopy;  Laterality: N/A;  . Colonoscopy N/A 09/14/2014    Procedure: COLONOSCOPY;  Surgeon: Arta Silence, MD;  Location: [facility] ENDOSCOPY;  Service: Endoscopy;  Laterality: N/A;  . Givens capsule study N/A 09/14/2014    Procedure: GIVENS CAPSULE STUDY;  Surgeon: Arta Silence, MD;  Location: [facility] ENDOSCOPY;  Service: Endoscopy;  Laterality: N/A;   Family History  Problem Relation Age of Onset  . Diabetes Mother   . Ovarian cancer Sister   . Cancer Sister     ovarian and colon  . Colon cancer Neg Hx    History  Substance Use Topics  . Smoking status: Current Every Day Smoker -- 0.25 packs/day for 50 years    Types: Cigarettes  . Smokeless tobacco: Never Used     Comment: working on it.  . Alcohol Use: 1.2 oz/week    2 Glasses of wine per week     Comment:  sometimes a glass of wine.   OB History   Grav Para Term Preterm Abortions TAB SAB Ect Mult Living   6 5   1  1   5      Review of Systems  Constitutional: Negative for fever and fatigue.  HENT: Negative for congestion and drooling.   Eyes: Negative for pain.  Respiratory: Negative for  cough and shortness of breath.   Cardiovascular: Negative for chest pain.  Gastrointestinal: Positive for nausea. Negative for vomiting, abdominal pain and diarrhea.  Genitourinary: Negative for dysuria and hematuria.  Musculoskeletal: Negative for back pain, gait problem and neck pain.  Skin: Negative for color change.  Neurological: Positive for dizziness. Negative for headaches.  Hematological: Negative for adenopathy.  Psychiatric/Behavioral: Negative for behavioral problems.  All other systems reviewed and are negative.     Allergies  Penicillins and Avelox  Home Medications   Prior to Admission medications   Medication  Sig Start Date End Date Taking? Authorizing Provider  acyclovir (ZOVIRAX) 400 MG tablet Take 1 tablet (400 mg total) by mouth daily as needed (for flare ups). 09/16/14  Yes Bartholomew Crews, MD  amLODipine (NORVASC) 10 MG tablet Take 1 tablet (10 mg total) by mouth daily. 03/10/14  Yes Lesly Dukes, MD  aspirin EC 81 MG tablet Take 1 tablet (81 mg total) by mouth daily. 02/26/13  Yes [name], MD  baclofen (LIORESAL) 10 MG tablet Take 10 mg by mouth 3 (three) times daily as needed for muscle spasms.   Yes Historical Provider, MD  darunavir-cobicistat (PREZCOBIX) 800-150 MG per tablet Take 1 tablet by mouth daily. Swallow whole. Do NOT crush, break or chew tablets. Take with food. 09/15/14  Yes Alex Ronnie Derby, DO  emtricitabine-tenofovir (TRUVADA) 200-300 MG per tablet Take 1 tablet by mouth daily. 02/28/14  Yes Carlyle Basques, MD  famotidine (PEPCID) 40 MG tablet Take 40 mg by mouth daily.   Yes Historical Provider, MD  ferrous sulfate 325 (65 FE) MG tablet Take 1 tablet (325 mg total) by mouth 3 (three) times daily with meals. 09/15/14  Yes [name], DO  gabapentin (NEURONTIN) 600 MG tablet Take 600 mg by mouth 2 (two) times daily.   Yes Historical Provider, MD  isosorbide mononitrate (IMDUR) 30 MG 24 hr tablet Take 1 tablet (30 mg total) by mouth daily. 08/08/14  Yes Tasrif Ahmed, MD  lisinopril-hydrochlorothiazide (PRINZIDE,ZESTORETIC) 20-12.5 MG per tablet Take 2 tablets by mouth daily. 03/04/14  Yes [name], MD  loratadine (CLARITIN) 10 MG tablet Take 1 tablet (10 mg total) by mouth daily as needed for allergies. 09/15/14  Yes [name], DO  Nutritional Supplements (ENSURE NUTRA SHAKE HI-CAL) LIQD Take 1 Can by mouth 2 (two) times daily. 09/12/14  Yes Campbell Riches, MD  pantoprazole (PROTONIX) 40 MG tablet Take 1 tablet (40 mg total) by mouth 2 (two) times daily. 09/15/14  Yes [name], DO  sucralfate (CARAFATE) 1 G tablet Take 1 tablet (1 g total) by mouth 4 (four)  times daily. 09/08/14  Yes Karren Cobble, MD   BP 128/99  Pulse 48  Temp(Src) 98.2 F (36.8 C) (Oral)  Resp 18  Ht 5\' 2"  (1.575 m)  Wt 130 lb (58.968 kg)  BMI 23.77 kg/m2  SpO2 100% Physical Exam  Nursing note and vitals reviewed. Constitutional: She is oriented to person, place, and time. She appears well-developed and well-nourished.  HENT:  Head: Normocephalic.  Mouth/Throat: Oropharynx is clear and moist. No oropharyngeal exudate.  Eyes: Conjunctivae and EOM are normal. Pupils are equal, round, and reactive to light.  Neck: Normal range of motion. Neck supple.  Cardiovascular: Normal rate, regular rhythm, normal heart sounds and intact distal pulses.  Exam reveals no gallop and no friction rub.   No murmur heard. Pulmonary/Chest: Effort normal and breath sounds normal. No respiratory distress. She has  no wheezes.  Abdominal: Soft. Bowel sounds are normal. There is no tenderness. There is no rebound and no guarding.  Musculoskeletal: Normal range of motion. She exhibits no edema and no tenderness.  Neurological: She is alert and oriented to person, place, and time.  alert, oriented x3 speech: normal in context and clarity memory: intact grossly cranial nerves II-XII: intact motor strength: full proximally and distally no involuntary movements or tremors sensation: intact to light touch diffusely  cerebellar: finger-to-nose and heel-to-shin intact gait: normal forwards and backwards, initially stumbled slightly but improved upon giving her some time   Skin: Skin is warm and dry.  Psychiatric: She has a normal mood and affect. Her behavior is normal.    ED Course  Procedures (including critical care time) Labs Review Labs Reviewed  CBC WITH DIFFERENTIAL - Abnormal; Notable for the following:    RBC 3.59 (*)    Hemoglobin 8.4 (*)    HCT 29.1 (*)    MCH 23.4 (*)    MCHC 28.9 (*)    RDW 25.0 (*)    All other components within normal limits  COMPREHENSIVE METABOLIC  PANEL - Abnormal; Notable for the following:    Glucose, Bld 115 (*)    BUN 31 (*)    AST 38 (*)    ALT 37 (*)    GFR calc non Af Amer 50 (*)    GFR calc Af Amer 58 (*)    All other components within normal limits  URINALYSIS, ROUTINE W REFLEX MICROSCOPIC - Abnormal; Notable for the following:    Leukocytes, UA SMALL (*)    All other components within normal limits  URINE MICROSCOPIC-ADD ON - Abnormal; Notable for the following:    Squamous Epithelial / LPF FEW (*)    Bacteria, UA FEW (*)    All other components within normal limits    Imaging Review No results found.   EKG Interpretation   Date/Time:  Monday September 19 2014 08:07:59 EDT Ventricular Rate:  39 PR Interval:  140 QRS Duration: 86 QT Interval:  555 QTC Calculation: 447 R Axis:   11 Text Interpretation:  Sinus bradycardia Anterior infarct, old No  significant change since Confirmed by Minsa Weddington  MD, Celine Dishman ([id number]) on  09/19/2014 8:23:52 AM      MDM   Final diagnoses:  Nausea  Lightheadedness    7:58 AM 68 y.o. female w hx of HIV (10/05 CD4 was 67), CAD, CHF, Hep C, TIA's who presents with dizziness and nausea. She states that she was recently switched from reyataz to prezcobix and has not been tolerating this medication well. She states that shortly after taking the medication every day she feels nausea and dizziness and sometimes vomits. She states that the symptoms have been daily since she started the medication several days ago. She notes that her symptoms resolve several hours after taking the medicine. She denies any fevers, shortness of breath, or chest pain. She states that she had some intermittent subjective weakness on her left side which has been ongoing for months and has not significantly changed from baseline. She is afebrile and mildly bradycardic here (also brady on recent admission w/out significant change). Will get screening labwork. Of note she was recently admitted for anemia which is  thought to be due to iron deficiency and possibly AVM but her endoscopy/colonoscopy were unremarkable.   10:09 AM: I interpreted/reviewed the labs and/or imaging which were non-contributory.  Pt now asx. Likely a SE of the new medication. Will  rec she take the med w/ food and cont taking the carafate. Will provide a prescription for Zofran as needed. I have discussed the diagnosis/risks/treatment options with the patient and believe the pt to be eligible for discharge home to follow-up with Dr. Johnnye Sima (her ID physician) in 1-2 days if sx persist to discuss possible options. We also discussed returning to the ED immediately if new or worsening sx occur. We discussed the sx which are most concerning (e.g., worsening dizziness, sob, fever, inc vomiting) that necessitate immediate return. Medications administered to the patient during their visit and any new prescriptions provided to the patient are listed below.  Medications given during this visit Medications  sodium chloride 0.9 % bolus 250 mL (250 mLs Intravenous New Bag/Given 09/19/14 0833)  ondansetron (ZOFRAN) injection 4 mg (4 mg Intravenous Given 09/19/14 0834)    New Prescriptions   ONDANSETRON (ZOFRAN ODT) 4 MG DISINTEGRATING TABLET    4mg  ODT q4 hours prn nausea/vomit     Pamella Pert, MD 09/19/14 1412

## 2014-09-19 NOTE — ED Notes (Signed)
Per EMS, pt recently d/c and started on a new medication. Pt reports that after she takes the medication she gets nauseated and dizzy. This happened yesterday and today after taking the medication. Pt does not remember the name of the medication.

## 2014-09-20 LAB — HEPATITIS C GENOTYPE

## 2014-09-21 ENCOUNTER — Other Ambulatory Visit: Payer: PRIVATE HEALTH INSURANCE

## 2014-09-22 ENCOUNTER — Encounter: Payer: PRIVATE HEALTH INSURANCE | Admitting: Internal Medicine

## 2014-09-27 ENCOUNTER — Ambulatory Visit (HOSPITAL_COMMUNITY): Payer: PRIVATE HEALTH INSURANCE

## 2014-10-05 ENCOUNTER — Ambulatory Visit: Payer: PRIVATE HEALTH INSURANCE | Admitting: Infectious Diseases

## 2014-10-10 ENCOUNTER — Encounter (HOSPITAL_COMMUNITY): Payer: Self-pay | Admitting: Emergency Medicine

## 2014-10-11 ENCOUNTER — Other Ambulatory Visit: Payer: Self-pay | Admitting: Internal Medicine

## 2014-10-11 ENCOUNTER — Other Ambulatory Visit: Payer: Self-pay | Admitting: Licensed Clinical Social Worker

## 2014-10-11 DIAGNOSIS — B20 Human immunodeficiency virus [HIV] disease: Secondary | ICD-10-CM

## 2014-10-11 MED ORDER — EMTRICITABINE-TENOFOVIR DF 200-300 MG PO TABS
1.0000 | ORAL_TABLET | Freq: Every day | ORAL | Status: DC
Start: 2014-10-11 — End: 2015-05-22

## 2014-10-26 ENCOUNTER — Ambulatory Visit (INDEPENDENT_AMBULATORY_CARE_PROVIDER_SITE_OTHER): Payer: PRIVATE HEALTH INSURANCE | Admitting: Infectious Diseases

## 2014-10-26 ENCOUNTER — Encounter: Payer: Self-pay | Admitting: Infectious Diseases

## 2014-10-26 VITALS — BP 180/77 | HR 56 | Temp 98.1°F | Wt 128.0 lb

## 2014-10-26 DIAGNOSIS — M545 Low back pain, unspecified: Secondary | ICD-10-CM

## 2014-10-26 DIAGNOSIS — B182 Chronic viral hepatitis C: Secondary | ICD-10-CM

## 2014-10-26 DIAGNOSIS — Z113 Encounter for screening for infections with a predominantly sexual mode of transmission: Secondary | ICD-10-CM

## 2014-10-26 DIAGNOSIS — Z79899 Other long term (current) drug therapy: Secondary | ICD-10-CM

## 2014-10-26 DIAGNOSIS — B20 Human immunodeficiency virus [HIV] disease: Secondary | ICD-10-CM

## 2014-10-26 DIAGNOSIS — I10 Essential (primary) hypertension: Secondary | ICD-10-CM

## 2014-10-26 MED ORDER — CYCLOBENZAPRINE HCL 10 MG PO TABS: 10.0000 mg | ORAL_TABLET | Freq: Three times a day (TID) | ORAL | Status: DC | PRN

## 2014-10-26 MED ORDER — LEDIPASVIR-SOFOSBUVIR 90-400 MG PO TABS: 1.0000 | ORAL_TABLET | Freq: Every day | ORAL | Status: DC

## 2014-10-26 MED ORDER — DOLUTEGRAVIR SODIUM 50 MG PO TABS: 50.0000 mg | ORAL_TABLET | Freq: Every day | ORAL | Status: DC

## 2014-10-26 NOTE — Progress Notes (Signed)
   Subjective:    Patient ID: Nicole Mccormick, female    DOB: Apr 20, 1946, 68 y.o.   MRN: [id number]  HPI 68 yo F with hx of HIV+, Hep C, adm to hospital 10- to 10-8 with HgB of 5.7, She underwent EGD and colonoscopy which did not show active bleeding. She had capsule endoscopy which suggested small AVMs. She was started on protonix and due to this her ART was changed to prezcobix (off the ATVr). She has tolerated this poorly and has been taking TRV only.    Review of Systems  Constitutional: Negative for appetite change and unexpected weight change.  Cardiovascular: Negative for chest pain.  Genitourinary: Negative for difficulty urinating.  Neurological: Negative for headaches.       Objective:   Physical Exam  Constitutional: She appears well-developed and well-nourished.  HENT:  Mouth/Throat: No oropharyngeal exudate.  Eyes: EOM are normal. Pupils are equal, round, and reactive to light.  Neck: Neck supple.  Cardiovascular: Normal rate, regular rhythm and normal heart sounds.   Pulmonary/Chest: Effort normal and breath sounds normal.  Abdominal: Soft. Bowel sounds are normal. There is no tenderness.  Lymphadenopathy:    She has no cervical adenopathy.          Assessment & Plan:

## 2014-10-26 NOTE — Assessment & Plan Note (Signed)
Will change her meds to DTGV/TRV. Hopefully she does not have TRV mutations.  Will give her rx for ensure but she may difficulty getting this due to BMI of 23.  She has gotten flu shot.  She is offered/refuses condoms.  Will give her rx for flexeril.  Will see her back in 2-3 months.

## 2014-10-26 NOTE — Assessment & Plan Note (Signed)
She is 1b. Her other tests are (-).  Will send her for elastogram, have pharm eval her. Write harvoni rx.

## 2014-10-26 NOTE — Assessment & Plan Note (Signed)
Try to get her into IMTS clinic within 1 week. She is asx and monitors at home as well (with varying readings).

## 2014-10-31 ENCOUNTER — Encounter: Payer: Self-pay | Admitting: Internal Medicine

## 2014-10-31 ENCOUNTER — Ambulatory Visit (INDEPENDENT_AMBULATORY_CARE_PROVIDER_SITE_OTHER): Payer: PRIVATE HEALTH INSURANCE | Admitting: Internal Medicine

## 2014-10-31 VITALS — BP 185/74 | HR 52 | Temp 97.3°F | Resp 20 | Ht 63.0 in | Wt 130.2 lb

## 2014-10-31 DIAGNOSIS — I10 Essential (primary) hypertension: Secondary | ICD-10-CM

## 2014-10-31 MED ORDER — LISINOPRIL 40 MG PO TABS: 40.0000 mg | ORAL_TABLET | Freq: Every day | ORAL | Status: DC

## 2014-10-31 MED ORDER — CHLORTHALIDONE 25 MG PO TABS: 25.0000 mg | ORAL_TABLET | Freq: Every day | ORAL | Status: DC

## 2014-10-31 NOTE — Assessment & Plan Note (Addendum)
BP Readings from Last 3 Encounters:  10/31/14 185/74  10/26/14 180/77  09/19/14 128/77    Lab Results  Component Value Date   NA 140 09/19/2014   K 3.7 09/19/2014   CREATININE 1.09 09/19/2014    Assessment: Blood pressure control:  uncontrolled Progress toward BP goal:   deteriorated Comments: Still uncontrolled.  She reports compliance with 3 drug regimen.  She is not very physically active.  She says she watches her salt intake.  She eats fast food about once every two weeks.  She is still smoking 1/2 PPD - 1 PPD.   Drinks 6 cups of coffee per day.    Plan: Medications:  Continue amlodipine 10mg  daily and lisinopril 40mg  daily.  SWITCH HCTZ 25mg  to chlorthalidone 25mg  (same dose, greater potency).   Self management tools provided: home blood pressure logbook Other plans: Advised to decrease coffee (try 1-2 cups) per day.  Advised to increase physical activity ([facility]  Advised to try lean meats and fish and decrease canned food.  Advised to STOP smoking.  Will switch HCTZ to chlorthalidone to see if we can get better control.  RTC in 2 weeks for repeat BP and BMP.  If still uncontrolled consider adding spironolactone.

## 2014-10-31 NOTE — Progress Notes (Signed)
Internal Medicine Clinic Attending  Case discussed with Dr. Wilson soon after the resident saw the patient.  We reviewed the resident's history and exam and pertinent patient test results.  I agree with the assessment, diagnosis, and plan of care documented in the resident's note.  

## 2014-10-31 NOTE — Patient Instructions (Addendum)
1. Please STOP taking lisinopril-hydrochlorothiazide (Prinzide, Zestoril).  Please pick up your new prescription for lisinopril 40mg  daily and chlorthalidone 25mg  daily.  Continue to take amlodipine 10mg  daily.    2. Please take all medications as prescribed.    3. If you have worsening of your symptoms or new symptoms arise, please call the clinic ([phone]), or go to the ER immediately if symptoms are severe.   Please come back to clinic in two weeks to check your labs and your blood pressure.

## 2014-10-31 NOTE — Progress Notes (Signed)
   Subjective:    Patient ID: Nicole Mccormick, female    DOB: 09/11/1946, 68 y.o.   MRN: [id number]  HPI Comments: Nicole Mccormick is a 68 year old woman with a PMH of HTN, CAD, HIV and HCV here for follow-up of BP.  She was seen by [facility] last week and found to have elevated BP so referred here for follow-up.     Review of Systems  Constitutional: Negative for fever, chills and appetite change.  Eyes: Negative for visual disturbance.  Gastrointestinal: Negative for nausea, vomiting, diarrhea and constipation.  Genitourinary: Negative for dysuria.  Neurological: Negative for syncope, weakness and light-headedness.       Objective:   Physical Exam  Constitutional: She is oriented to person, place, and time. She appears well-developed. No distress.  HENT:  Head: Normocephalic and atraumatic.  Mouth/Throat: Oropharynx is clear and moist. No oropharyngeal exudate.  Eyes: EOM are normal. Pupils are equal, round, and reactive to light.  Cardiovascular: Normal rate and regular rhythm.  Exam reveals no gallop and no friction rub.   Murmur heard. Pulmonary/Chest: Effort normal and breath sounds normal. No respiratory distress. She has no wheezes. She has no rales.  Abdominal: Soft. Bowel sounds are normal. She exhibits no distension. There is no tenderness.  Musculoskeletal: Normal range of motion. She exhibits no edema or tenderness.  Neurological: She is alert and oriented to person, place, and time. No cranial nerve deficit.  Skin: Skin is warm. She is not diaphoretic.  Psychiatric: She has a normal mood and affect. Her behavior is normal.  Vitals reviewed.         Assessment & Plan:  Please see problem based assessment and plan.

## 2014-11-02 ENCOUNTER — Ambulatory Visit (HOSPITAL_COMMUNITY)
Admission: RE | Admit: 2014-11-02 | Discharge: 2014-11-02 | Disposition: A | Discharge status: Home / Self Care | Payer: PRIVATE HEALTH INSURANCE | Source: Ambulatory Visit | Attending: Infectious Diseases | Admitting: Infectious Diseases

## 2014-11-02 DIAGNOSIS — B182 Chronic viral hepatitis C: Secondary | ICD-10-CM

## 2014-11-09 ENCOUNTER — Other Ambulatory Visit: Payer: Self-pay | Admitting: Pharmacist Clinician (PhC)/ Clinical Pharmacy Specialist

## 2014-11-11 ENCOUNTER — Ambulatory Visit: Payer: PRIVATE HEALTH INSURANCE | Admitting: Cardiology

## 2014-11-14 ENCOUNTER — Encounter: Payer: Self-pay | Admitting: Cardiology

## 2014-11-23 ENCOUNTER — Other Ambulatory Visit: Payer: Self-pay | Admitting: *Deleted

## 2014-11-24 ENCOUNTER — Ambulatory Visit (INDEPENDENT_AMBULATORY_CARE_PROVIDER_SITE_OTHER): Payer: PRIVATE HEALTH INSURANCE | Admitting: Internal Medicine

## 2014-11-24 ENCOUNTER — Encounter: Payer: Self-pay | Admitting: Internal Medicine

## 2014-11-24 VITALS — BP 143/70 | HR 72 | Temp 97.2°F | Ht 63.0 in | Wt 132.5 lb

## 2014-11-24 DIAGNOSIS — M62830 Muscle spasm of back: Secondary | ICD-10-CM

## 2014-11-24 DIAGNOSIS — F1911 Other psychoactive substance abuse, in remission: Secondary | ICD-10-CM | POA: Insufficient documentation

## 2014-11-24 DIAGNOSIS — F1920 Other psychoactive substance dependence, uncomplicated: Secondary | ICD-10-CM

## 2014-11-24 MED ORDER — DIAZEPAM 2 MG PO TABS: 2.0000 mg | ORAL_TABLET | Freq: Four times a day (QID) | ORAL | Status: DC | PRN

## 2014-11-24 NOTE — Progress Notes (Signed)
Medicine attending: I personally interviewed and briefly examined this patient and reviewed pertinent laboratory and radiographic data together with resident physician Dr. Esmond Camper and I concur with her  evaluation and management plan.

## 2014-11-24 NOTE — Progress Notes (Signed)
Subjective:    Patient ID: Nicole Mccormick, female    DOB: 04-03-1946, 68 y.o.   MRN: [id number]  HPI Nicole Mccormick is a 68 year old woman with a PMH of HTN, CAD, HIV and HCV here for back pain. Her pain is chronic, but has gotten worse over the past 2 months. She describes the pain as spasms that start on the right lower back and extend up toward her shoulder. They are happening nearly every day. They can happen while she is walking or sitting or lying down, and she is finding them debilitating. She finds herself relying on others in a way that is very upsetting to her as a result of the spasms. They are worst with movement. She has tried flexeril, gabapentin, hot packs, holy water, baclofen and topical ointment. Nothing has helped.  She had a CT scan of her chest in 03/2013 to investigate her back pain at that time. No acute osseous abnormalities were identified. Mild wedging of an upper thoracic vertebral body was present but unchanged. There was no noted stenosis or spinal cord impingement. .    Medication List       This list is accurate as of: 11/24/14  5:15 PM.  Always use your most recent med list.               acyclovir 400 MG tablet  Commonly known as:  ZOVIRAX  Take 1 tablet (400 mg total) by mouth daily as needed (for flare ups).     amLODipine 10 MG tablet  Commonly known as:  NORVASC  Take 1 tablet (10 mg total) by mouth daily.     baclofen 10 MG tablet  Commonly known as:  LIORESAL  Take 10 mg by mouth 3 (three) times daily as needed for muscle spasms.     chlorthalidone 25 MG tablet  Commonly known as:  HYGROTON  Take 1 tablet (25 mg total) by mouth daily.     cyclobenzaprine 10 MG tablet  Commonly known as:  FLEXERIL  Take 1 tablet (10 mg total) by mouth 3 (three) times daily as needed for muscle spasms.     diazepam 2 MG tablet  Commonly known as:  VALIUM  Take 1 tablet (2 mg total) by mouth every 6 (six) hours as needed for anxiety.     dolutegravir 50 MG tablet    Commonly known as:  TIVICAY  Take 1 tablet (50 mg total) by mouth daily.     emtricitabine-tenofovir 200-300 MG per tablet  Commonly known as:  TRUVADA  Take 1 tablet by mouth daily.     ENSURE NUTRA SHAKE HI-CAL Liqd  Take 1 Can by mouth 2 (two) times daily.     ferrous sulfate 325 (65 FE) MG tablet  Take 1 tablet (325 mg total) by mouth 3 (three) times daily with meals.     gabapentin 600 MG tablet  Commonly known as:  NEURONTIN  Take 600 mg by mouth 2 (two) times daily.     isosorbide mononitrate 30 MG 24 hr tablet  Commonly known as:  IMDUR  Take 1 tablet (30 mg total) by mouth daily.     Ledipasvir-Sofosbuvir 90-400 MG Tabs  Commonly known as:  HARVONI  Take 1 tablet by mouth daily.     lisinopril 40 MG tablet  Commonly known as:  PRINIVIL,ZESTRIL  Take 1 tablet (40 mg total) by mouth daily.     loratadine 10 MG tablet  Commonly known as:  CLARITIN  Take 1 tablet (10 mg total) by mouth daily as needed for allergies.     ondansetron 4 MG disintegrating tablet  Commonly known as:  ZOFRAN ODT  4mg  ODT q4 hours prn nausea/vomit     pantoprazole 40 MG tablet  Commonly known as:  PROTONIX  Take 1 tablet (40 mg total) by mouth 2 (two) times daily.     sucralfate 1 G tablet  Commonly known as:  CARAFATE  Take 1 tablet (1 g total) by mouth 4 (four) times daily.        Review of Systems  Constitutional: Positive for activity change.  HENT: Negative.   Eyes: Negative.   Respiratory: Negative.   Cardiovascular: Negative.   Gastrointestinal: Negative.   Endocrine: Negative.   Genitourinary: Negative.   Musculoskeletal: Positive for back pain.  Skin: Negative.   Neurological: Negative.        Objective:   Physical Exam  Constitutional: She is oriented to person, place, and time. She appears well-developed and well-nourished.  HENT:  Head: Normocephalic and atraumatic.  Eyes: Conjunctivae are normal. Pupils are equal, round, and reactive to light.  Neck:  Normal range of motion.  Cardiovascular: Normal rate, regular rhythm and normal heart sounds.   Pulmonary/Chest: Effort normal and breath sounds normal.  Abdominal: Soft. Bowel sounds are normal.  Musculoskeletal: She exhibits tenderness.  Neurological: She is alert and oriented to person, place, and time.  Skin: Skin is warm and dry.       Assessment & Plan:  Nicole Mccormick back spasms are interfering with her independence. She has tried many forms of therapy.  - Encouraged applying a heating pad - Prescribed trial of valium 2 to 4 mg q6 hours PRN; patient does have a history of substance dependence - Return to clinic as needed if the valium does not help; otherwise return in 6 months

## 2014-11-24 NOTE — Progress Notes (Signed)
Valium rx called to [facility] pharmacy per pt's request.

## 2014-11-24 NOTE — Patient Instructions (Addendum)
For your back spasms, take the valium 2.5 mg by mouth every 6 hours only when you need it. If your spasms are particularly bad, you may take 5 mg once.   Marland KitchenMarland Kitchen

## 2014-11-29 MED ORDER — LORATADINE 10 MG PO TABS: 10.0000 mg | ORAL_TABLET | Freq: Every day | ORAL | Status: DC | PRN

## 2014-12-09 DIAGNOSIS — B20 Human immunodeficiency virus [HIV] disease: Secondary | ICD-10-CM | POA: Diagnosis not present

## 2014-12-12 DIAGNOSIS — B20 Human immunodeficiency virus [HIV] disease: Secondary | ICD-10-CM | POA: Diagnosis not present

## 2014-12-13 DIAGNOSIS — B20 Human immunodeficiency virus [HIV] disease: Secondary | ICD-10-CM | POA: Diagnosis not present

## 2014-12-14 DIAGNOSIS — B20 Human immunodeficiency virus [HIV] disease: Secondary | ICD-10-CM | POA: Diagnosis not present

## 2014-12-15 DIAGNOSIS — B20 Human immunodeficiency virus [HIV] disease: Secondary | ICD-10-CM | POA: Diagnosis not present

## 2014-12-16 DIAGNOSIS — B20 Human immunodeficiency virus [HIV] disease: Secondary | ICD-10-CM | POA: Diagnosis not present

## 2014-12-19 DIAGNOSIS — B20 Human immunodeficiency virus [HIV] disease: Secondary | ICD-10-CM | POA: Diagnosis not present

## 2014-12-20 DIAGNOSIS — B20 Human immunodeficiency virus [HIV] disease: Secondary | ICD-10-CM | POA: Diagnosis not present

## 2014-12-21 DIAGNOSIS — B20 Human immunodeficiency virus [HIV] disease: Secondary | ICD-10-CM | POA: Diagnosis not present

## 2014-12-22 DIAGNOSIS — B20 Human immunodeficiency virus [HIV] disease: Secondary | ICD-10-CM | POA: Diagnosis not present

## 2014-12-23 DIAGNOSIS — B20 Human immunodeficiency virus [HIV] disease: Secondary | ICD-10-CM | POA: Diagnosis not present

## 2014-12-25 ENCOUNTER — Other Ambulatory Visit: Payer: Self-pay | Admitting: Internal Medicine

## 2014-12-26 DIAGNOSIS — B20 Human immunodeficiency virus [HIV] disease: Secondary | ICD-10-CM | POA: Diagnosis not present

## 2014-12-27 DIAGNOSIS — B20 Human immunodeficiency virus [HIV] disease: Secondary | ICD-10-CM | POA: Diagnosis not present

## 2014-12-28 DIAGNOSIS — B20 Human immunodeficiency virus [HIV] disease: Secondary | ICD-10-CM | POA: Diagnosis not present

## 2014-12-29 DIAGNOSIS — B20 Human immunodeficiency virus [HIV] disease: Secondary | ICD-10-CM | POA: Diagnosis not present

## 2014-12-30 DIAGNOSIS — B20 Human immunodeficiency virus [HIV] disease: Secondary | ICD-10-CM | POA: Diagnosis not present

## 2014-12-31 ENCOUNTER — Other Ambulatory Visit: Payer: Self-pay | Admitting: Internal Medicine

## 2015-01-02 DIAGNOSIS — B20 Human immunodeficiency virus [HIV] disease: Secondary | ICD-10-CM | POA: Diagnosis not present

## 2015-01-03 ENCOUNTER — Other Ambulatory Visit: Payer: Self-pay | Admitting: Internal Medicine

## 2015-01-03 ENCOUNTER — Other Ambulatory Visit: Payer: Self-pay | Admitting: Infectious Diseases

## 2015-01-03 DIAGNOSIS — B20 Human immunodeficiency virus [HIV] disease: Secondary | ICD-10-CM | POA: Diagnosis not present

## 2015-01-03 MED ORDER — PANTOPRAZOLE SODIUM 40 MG PO TBEC: 40.0000 mg | DELAYED_RELEASE_TABLET | Freq: Two times a day (BID) | ORAL | Status: DC

## 2015-01-03 NOTE — Telephone Encounter (Signed)
Rx called in to pharmacy. 

## 2015-01-04 DIAGNOSIS — B20 Human immunodeficiency virus [HIV] disease: Secondary | ICD-10-CM | POA: Diagnosis not present

## 2015-01-05 DIAGNOSIS — B20 Human immunodeficiency virus [HIV] disease: Secondary | ICD-10-CM | POA: Diagnosis not present

## 2015-01-09 ENCOUNTER — Telehealth: Payer: Self-pay | Admitting: Infectious Diseases

## 2015-01-09 DIAGNOSIS — B20 Human immunodeficiency virus [HIV] disease: Secondary | ICD-10-CM | POA: Diagnosis not present

## 2015-01-09 NOTE — Telephone Encounter (Signed)
Pt called and asked not to take protonix while on harvoni.  She agrees.

## 2015-01-10 DIAGNOSIS — B20 Human immunodeficiency virus [HIV] disease: Secondary | ICD-10-CM | POA: Diagnosis not present

## 2015-01-11 DIAGNOSIS — B20 Human immunodeficiency virus [HIV] disease: Secondary | ICD-10-CM | POA: Diagnosis not present

## 2015-01-12 ENCOUNTER — Other Ambulatory Visit: Payer: PRIVATE HEALTH INSURANCE

## 2015-01-12 DIAGNOSIS — B20 Human immunodeficiency virus [HIV] disease: Secondary | ICD-10-CM | POA: Diagnosis not present

## 2015-01-13 DIAGNOSIS — B20 Human immunodeficiency virus [HIV] disease: Secondary | ICD-10-CM | POA: Diagnosis not present

## 2015-01-16 ENCOUNTER — Other Ambulatory Visit: Payer: Self-pay | Admitting: Infectious Diseases

## 2015-01-16 DIAGNOSIS — B20 Human immunodeficiency virus [HIV] disease: Secondary | ICD-10-CM | POA: Diagnosis not present

## 2015-01-17 DIAGNOSIS — B20 Human immunodeficiency virus [HIV] disease: Secondary | ICD-10-CM | POA: Diagnosis not present

## 2015-01-18 DIAGNOSIS — B20 Human immunodeficiency virus [HIV] disease: Secondary | ICD-10-CM | POA: Diagnosis not present

## 2015-01-19 DIAGNOSIS — B20 Human immunodeficiency virus [HIV] disease: Secondary | ICD-10-CM | POA: Diagnosis not present

## 2015-01-20 DIAGNOSIS — B20 Human immunodeficiency virus [HIV] disease: Secondary | ICD-10-CM | POA: Diagnosis not present

## 2015-01-23 DIAGNOSIS — B20 Human immunodeficiency virus [HIV] disease: Secondary | ICD-10-CM | POA: Diagnosis not present

## 2015-01-23 IMAGING — CR DG CHEST 1V PORT
1 series · 1 of 1 positions shown · non-contrast
Comparison: Chest radiograph performed 08/07/2014

CLINICAL DATA: Acute onset of diffuse chest pain and shortness of
breath. Initial encounter.

EXAM:
PORTABLE CHEST - 1 VIEW

[AP]
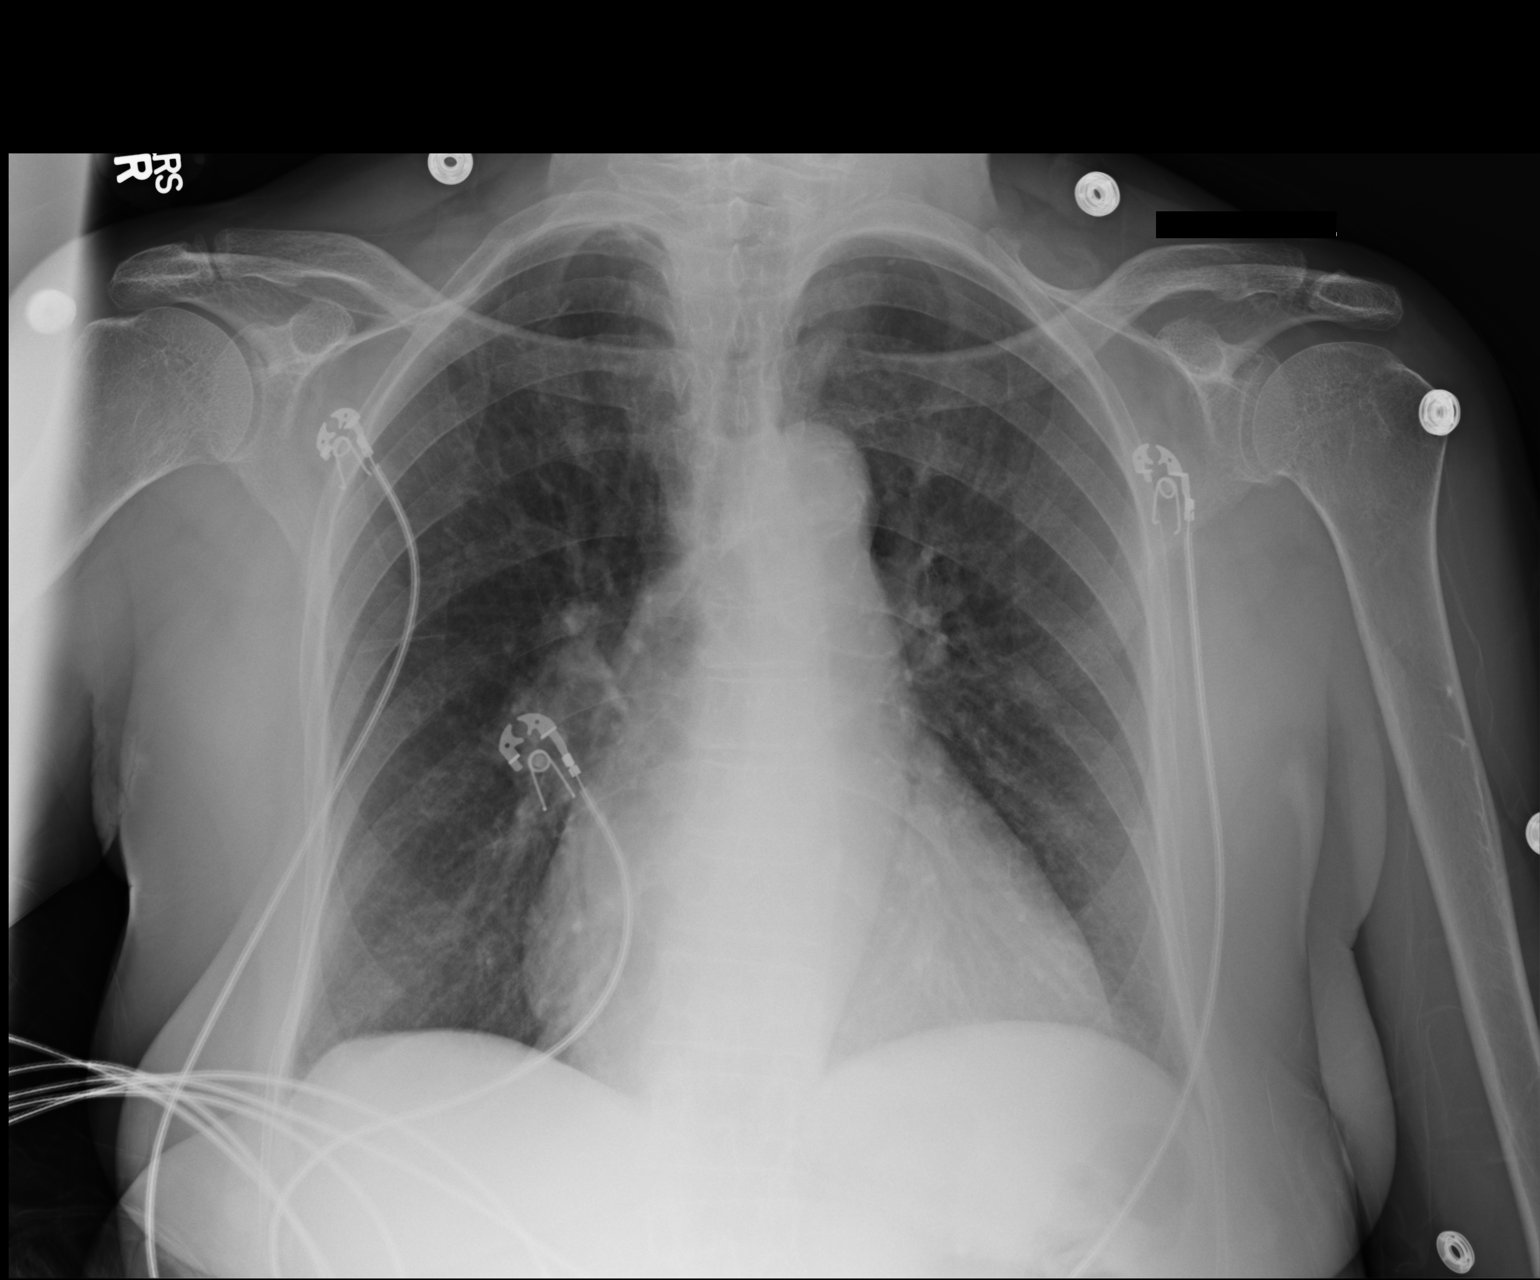

[1 of 1 positions shown; findings below may reference images not displayed]

FINDINGS: The lungs are well-aerated and clear. There is no evidence of focal
opacification, pleural effusion or pneumothorax.

The cardiomediastinal silhouette is mildly enlarged. No acute
osseous abnormalities are seen.
IMPRESSION: Mild cardiomegaly; no acute cardiopulmonary process seen.

## 2015-01-24 DIAGNOSIS — B20 Human immunodeficiency virus [HIV] disease: Secondary | ICD-10-CM | POA: Diagnosis not present

## 2015-01-25 ENCOUNTER — Ambulatory Visit: Payer: Medicaid Other | Admitting: Infectious Diseases

## 2015-01-25 DIAGNOSIS — B20 Human immunodeficiency virus [HIV] disease: Secondary | ICD-10-CM | POA: Diagnosis not present

## 2015-01-26 ENCOUNTER — Other Ambulatory Visit: Payer: Medicare Other

## 2015-01-26 ENCOUNTER — Ambulatory Visit: Payer: PRIVATE HEALTH INSURANCE | Admitting: Infectious Diseases

## 2015-01-26 DIAGNOSIS — B20 Human immunodeficiency virus [HIV] disease: Secondary | ICD-10-CM | POA: Diagnosis not present

## 2015-01-26 DIAGNOSIS — Z113 Encounter for screening for infections with a predominantly sexual mode of transmission: Secondary | ICD-10-CM

## 2015-01-26 DIAGNOSIS — Z79899 Other long term (current) drug therapy: Secondary | ICD-10-CM

## 2015-01-26 LAB — CBC
HCT: 40.3 % (ref 36.0–46.0)
Hemoglobin: 13.3 g/dL (ref 12.0–15.0)
MCH: 33.8 pg (ref 26.0–34.0)
MCHC: 33 g/dL (ref 30.0–36.0)
MCV: 102.3 fL — ABNORMAL HIGH (ref 78.0–100.0)
MPV: 9.6 fL (ref 8.6–12.4)
Platelets: 258 10*3/uL (ref 150–400)
RBC: 3.94 MIL/uL (ref 3.87–5.11)
RDW: 14.5 % (ref 11.5–15.5)
WBC: 5.3 10*3/uL (ref 4.0–10.5)

## 2015-01-27 DIAGNOSIS — B20 Human immunodeficiency virus [HIV] disease: Secondary | ICD-10-CM | POA: Diagnosis not present

## 2015-01-27 LAB — LIPID PANEL
Cholesterol: 142 mg/dL (ref 0–200)
HDL: 64 mg/dL (ref >39)
LDL Cholesterol: 66 mg/dL (ref 0–99)
Total CHOL/HDL Ratio: 2.2 Ratio
Triglycerides: 59 mg/dL (ref <150)
VLDL: 12 mg/dL (ref 0–40)

## 2015-01-27 LAB — COMPREHENSIVE METABOLIC PANEL
ALT: 11 U/L (ref 0–35)
AST: 17 U/L (ref 0–37)
Albumin: 3.9 g/dL (ref 3.5–5.2)
Alkaline Phosphatase: 73 U/L (ref 39–117)
BUN: 19 mg/dL (ref 6–23)
CO2: 30 mEq/L (ref 19–32)
Calcium: 9.1 mg/dL (ref 8.4–10.5)
Chloride: 102 mEq/L (ref 96–112)
Creat: 0.75 mg/dL (ref 0.50–1.10)
Glucose, Bld: 96 mg/dL (ref 70–99)
Potassium: 3.4 mEq/L — ABNORMAL LOW (ref 3.5–5.3)
Sodium: 138 mEq/L (ref 135–145)
Total Bilirubin: 0.3 mg/dL (ref 0.2–1.2)
Total Protein: 7.3 g/dL (ref 6.0–8.3)

## 2015-01-27 LAB — HIV-1 RNA QUANT-NO REFLEX-BLD
HIV 1 RNA Quant: <20 copies/mL (ref <20)
HIV-1 RNA Quant, Log: <1.3 {Log} (ref <1.30)

## 2015-01-27 LAB — RPR

## 2015-01-27 LAB — T-HELPER CELL (CD4) - (RCID CLINIC ONLY)
CD4 % Helper T Cell: 24 % — ABNORMAL LOW (ref 33–55)
CD4 T Cell Abs: 520 /uL (ref 400–2700)

## 2015-01-30 DIAGNOSIS — B20 Human immunodeficiency virus [HIV] disease: Secondary | ICD-10-CM | POA: Diagnosis not present

## 2015-01-31 ENCOUNTER — Ambulatory Visit: Payer: Medicaid Other | Admitting: Infectious Diseases

## 2015-01-31 DIAGNOSIS — B20 Human immunodeficiency virus [HIV] disease: Secondary | ICD-10-CM | POA: Diagnosis not present

## 2015-02-01 DIAGNOSIS — B20 Human immunodeficiency virus [HIV] disease: Secondary | ICD-10-CM | POA: Diagnosis not present

## 2015-02-02 DIAGNOSIS — B20 Human immunodeficiency virus [HIV] disease: Secondary | ICD-10-CM | POA: Diagnosis not present

## 2015-02-03 DIAGNOSIS — B20 Human immunodeficiency virus [HIV] disease: Secondary | ICD-10-CM | POA: Diagnosis not present

## 2015-02-06 DIAGNOSIS — B20 Human immunodeficiency virus [HIV] disease: Secondary | ICD-10-CM | POA: Diagnosis not present

## 2015-02-07 DIAGNOSIS — B20 Human immunodeficiency virus [HIV] disease: Secondary | ICD-10-CM | POA: Diagnosis not present

## 2015-02-08 DIAGNOSIS — B20 Human immunodeficiency virus [HIV] disease: Secondary | ICD-10-CM | POA: Diagnosis not present

## 2015-02-09 DIAGNOSIS — B20 Human immunodeficiency virus [HIV] disease: Secondary | ICD-10-CM | POA: Diagnosis not present

## 2015-02-10 DIAGNOSIS — B20 Human immunodeficiency virus [HIV] disease: Secondary | ICD-10-CM | POA: Diagnosis not present

## 2015-02-12 ENCOUNTER — Encounter (HOSPITAL_COMMUNITY): Payer: Self-pay | Admitting: Emergency Medicine

## 2015-02-12 ENCOUNTER — Emergency Department (HOSPITAL_COMMUNITY)
Admission: EM | Admit: 2015-02-12 | Discharge: 2015-02-12 | Disposition: A | Discharge status: Home / Self Care | Payer: Medicare Other | Attending: Emergency Medicine | Admitting: Emergency Medicine

## 2015-02-12 DIAGNOSIS — Z880 Allergy status to penicillin: Secondary | ICD-10-CM | POA: Insufficient documentation

## 2015-02-12 DIAGNOSIS — I509 Heart failure, unspecified: Secondary | ICD-10-CM | POA: Insufficient documentation

## 2015-02-12 DIAGNOSIS — K219 Gastro-esophageal reflux disease without esophagitis: Secondary | ICD-10-CM | POA: Insufficient documentation

## 2015-02-12 DIAGNOSIS — J45909 Unspecified asthma, uncomplicated: Secondary | ICD-10-CM | POA: Diagnosis not present

## 2015-02-12 DIAGNOSIS — Z8619 Personal history of other infectious and parasitic diseases: Secondary | ICD-10-CM | POA: Diagnosis not present

## 2015-02-12 DIAGNOSIS — Z720 Tobacco use: Secondary | ICD-10-CM | POA: Diagnosis not present

## 2015-02-12 DIAGNOSIS — M159 Polyosteoarthritis, unspecified: Secondary | ICD-10-CM

## 2015-02-12 DIAGNOSIS — M170 Bilateral primary osteoarthritis of knee: Secondary | ICD-10-CM | POA: Insufficient documentation

## 2015-02-12 DIAGNOSIS — R011 Cardiac murmur, unspecified: Secondary | ICD-10-CM | POA: Diagnosis not present

## 2015-02-12 DIAGNOSIS — I2510 Atherosclerotic heart disease of native coronary artery without angina pectoris: Secondary | ICD-10-CM | POA: Diagnosis not present

## 2015-02-12 DIAGNOSIS — D649 Anemia, unspecified: Secondary | ICD-10-CM | POA: Diagnosis not present

## 2015-02-12 DIAGNOSIS — G40909 Epilepsy, unspecified, not intractable, without status epilepticus: Secondary | ICD-10-CM | POA: Diagnosis not present

## 2015-02-12 DIAGNOSIS — M1711 Unilateral primary osteoarthritis, right knee: Secondary | ICD-10-CM | POA: Diagnosis not present

## 2015-02-12 DIAGNOSIS — M19031 Primary osteoarthritis, right wrist: Secondary | ICD-10-CM | POA: Diagnosis not present

## 2015-02-12 DIAGNOSIS — I10 Essential (primary) hypertension: Secondary | ICD-10-CM | POA: Insufficient documentation

## 2015-02-12 DIAGNOSIS — M150 Primary generalized (osteo)arthritis: Secondary | ICD-10-CM

## 2015-02-12 DIAGNOSIS — M8949 Other hypertrophic osteoarthropathy, multiple sites: Secondary | ICD-10-CM

## 2015-02-12 DIAGNOSIS — M2550 Pain in unspecified joint: Secondary | ICD-10-CM | POA: Diagnosis present

## 2015-02-12 DIAGNOSIS — Z21 Asymptomatic human immunodeficiency virus [HIV] infection status: Secondary | ICD-10-CM | POA: Diagnosis not present

## 2015-02-12 LAB — CBC WITH DIFFERENTIAL/PLATELET
Basophils Absolute: 0 10*3/uL (ref 0.0–0.1)
Basophils Relative: 0 % (ref 0–1)
Eosinophils Absolute: 0.1 10*3/uL (ref 0.0–0.7)
Eosinophils Relative: 2 % (ref 0–5)
HCT: 39 % (ref 36.0–46.0)
Hemoglobin: 12.9 g/dL (ref 12.0–15.0)
Lymphocytes Relative: 37 % (ref 12–46)
Lymphs Abs: 1.9 10*3/uL (ref 0.7–4.0)
MCH: 34 pg (ref 26.0–34.0)
MCHC: 33.1 g/dL (ref 30.0–36.0)
MCV: 102.9 fL — ABNORMAL HIGH (ref 78.0–100.0)
Monocytes Absolute: 0.4 10*3/uL (ref 0.1–1.0)
Monocytes Relative: 8 % (ref 3–12)
Neutro Abs: 2.8 10*3/uL (ref 1.7–7.7)
Neutrophils Relative %: 53 % (ref 43–77)
Platelets: 190 10*3/uL (ref 150–400)
RBC: 3.79 MIL/uL — ABNORMAL LOW (ref 3.87–5.11)
RDW: 13.5 % (ref 11.5–15.5)
WBC: 5.3 10*3/uL (ref 4.0–10.5)

## 2015-02-12 LAB — URINALYSIS, ROUTINE W REFLEX MICROSCOPIC
Bilirubin Urine: NEGATIVE
Glucose, UA: NEGATIVE mg/dL
Ketones, ur: NEGATIVE mg/dL
Nitrite: NEGATIVE
Protein, ur: NEGATIVE mg/dL
Specific Gravity, Urine: 1.024 (ref 1.005–1.030)
Urobilinogen, UA: 0.2 mg/dL (ref 0.0–1.0)
pH: 5.5 (ref 5.0–8.0)

## 2015-02-12 LAB — URINE MICROSCOPIC-ADD ON

## 2015-02-12 LAB — BASIC METABOLIC PANEL
Anion gap: 5 (ref 5–15)
BUN: 25 mg/dL — ABNORMAL HIGH (ref 6–23)
CO2: 30 mmol/L (ref 19–32)
Calcium: 8.5 mg/dL (ref 8.4–10.5)
Chloride: 104 mmol/L (ref 96–112)
Creatinine, Ser: 0.84 mg/dL (ref 0.50–1.10)
GFR calc Af Amer: 80 mL/min — ABNORMAL LOW (ref >90)
GFR calc non Af Amer: 69 mL/min — ABNORMAL LOW (ref >90)
Glucose, Bld: 105 mg/dL — ABNORMAL HIGH (ref 70–99)
Potassium: 3.3 mmol/L — ABNORMAL LOW (ref 3.5–5.1)
Sodium: 139 mmol/L (ref 135–145)

## 2015-02-12 MED ORDER — KETOROLAC TROMETHAMINE 60 MG/2ML IM SOLN
30.0000 mg | Freq: Once | INTRAMUSCULAR | Status: AC
Start: 2015-02-12 — End: 2015-02-12
  Administered 2015-02-12: 30 mg via INTRAMUSCULAR
  Filled 2015-02-12: qty 2

## 2015-02-12 NOTE — ED Notes (Signed)
Pt did not want to wait for d/c instructions or to sign out. Pt gone at this time.

## 2015-02-12 NOTE — ED Provider Notes (Signed)
CSN: [id number]     Arrival date & time 02/12/15  5784 History   First MD Initiated Contact with Patient 02/12/15 0940     Chief Complaint  Patient presents with  . Joint Pain     (Consider location/radiation/quality/duration/timing/severity/associated sxs/prior Treatment) Patient is a 69 y.o. female presenting with general illness.  Illness Location:  R wrist, R knee Quality:  Aching pain Severity:  Moderate Onset quality:  Gradual Duration:  1 day Timing:  Constant Progression:  Unchanged Chronicity:  New Context:  Ho OA Relieved by:  Nothing Worsened by:  Palpation, movement Associated symptoms: no cough, no diarrhea, no fatigue, no fever, no nausea and no vomiting     Past Medical History  Diagnosis Date  . Hypertension   . Abnormal Pap smear   . Asthma   . Coronary artery disease   . HIV infection     diagnosed before 2008  . Varicosities   . Seizures     last sz fri nov 2  . GERD (gastroesophageal reflux disease)     previously on aciphex, discontinued november 2012  because patient asymptomatic, and concern about interference with HIV meds.  May try pepcid in the future if symptoms return  . Iron deficiency anemia     Ferritin = 2 in november 2012, started on iron supplemenation  . Arthritis   . CHF (congestive heart failure)   . Heart murmur   . Hepatitis C antibody test positive    Past Surgical History  Procedure Laterality Date  . Abdominal hysterectomy    . Esophagogastroduodenoscopy  10/13/11    small hiatal hernia  . Colonoscopy  10/13/11    small adenoma, anal condyloma  . Colonoscopy  10/13/2011    Procedure: COLONOSCOPY;  Surgeon: Gatha Mayer, MD;  Location: [place];  Service: Endoscopy;  Laterality: N/A;  . Esophagogastroduodenoscopy  10/13/2011    Procedure: ESOPHAGOGASTRODUODENOSCOPY (EGD);  Surgeon: Gatha Mayer, MD;  Location: [facility] ENDOSCOPY;  Service: Endoscopy;  Laterality: N/A;  . Esophagogastroduodenoscopy N/A 09/14/2014   Procedure: ESOPHAGOGASTRODUODENOSCOPY (EGD);  Surgeon: Arta Silence, MD;  Location: [facility] ENDOSCOPY;  Service: Endoscopy;  Laterality: N/A;  . Colonoscopy N/A 09/14/2014    Procedure: COLONOSCOPY;  Surgeon: Arta Silence, MD;  Location: [facility] ENDOSCOPY;  Service: Endoscopy;  Laterality: N/A;  . Givens capsule study N/A 09/14/2014    Procedure: GIVENS CAPSULE STUDY;  Surgeon: Arta Silence, MD;  Location: [facility] ENDOSCOPY;  Service: Endoscopy;  Laterality: N/A;   Family History  Problem Relation Age of Onset  . Diabetes Mother   . Ovarian cancer Sister   . Cancer Sister     ovarian and colon  . Colon cancer Neg Hx    History  Substance Use Topics  . Smoking status: Current Every Day Smoker -- 1.00 packs/day for 50 years    Types: Cigarettes  . Smokeless tobacco: Never Used     Comment: trying to cut back  . Alcohol Use: 1.2 oz/week    2 Glasses of wine per week     Comment: glass of wine everyday.   OB History    Gravida Para Term Preterm AB TAB SAB Ectopic Multiple Living   6 5   1  1   5      Review of Systems  Constitutional: Negative for fever and fatigue.  Respiratory: Negative for cough.   Gastrointestinal: Negative for nausea, vomiting and diarrhea.  All other systems reviewed and are negative.     Allergies  Penicillins and  Avelox  Home Medications   Prior to Admission medications   Medication Sig Start Date End Date Taking? Authorizing Provider  acyclovir (ZOVIRAX) 400 MG tablet Take 1 tablet (400 mg total) by mouth daily as needed (for flare ups). 09/16/14   Bartholomew Crews, MD  amLODipine (NORVASC) 10 MG tablet Take 1 tablet (10 mg total) by mouth daily. 03/10/14   Pollie Friar, MD  baclofen (LIORESAL) 10 MG tablet Take 10 mg by mouth 3 (three) times daily as needed for muscle spasms.    Historical Provider, MD  chlorthalidone (HYGROTON) 25 MG tablet take 1 tablet by mouth once daily 01/02/15   Karlene Einstein, MD  diazepam (VALIUM) 2 MG tablet take 1 tablet by  mouth every 6 hours if needed for anxiety 01/03/15   Karlene Einstein, MD  dolutegravir (TIVICAY) 50 MG tablet Take 1 tablet (50 mg total) by mouth daily. 10/26/14   Campbell Riches, MD  emtricitabine-tenofovir (TRUVADA) 200-300 MG per tablet Take 1 tablet by mouth daily. 10/11/14   Carlyle Basques, MD  ferrous sulfate 325 (65 FE) MG tablet Take 1 tablet (325 mg total) by mouth 3 (three) times daily with meals. 09/15/14   [name], DO  gabapentin (NEURONTIN) 600 MG tablet Take 600 mg by mouth 2 (two) times daily.    Historical Provider, MD  isosorbide mononitrate (IMDUR) 30 MG 24 hr tablet take 1 tablet by mouth once daily 01/03/15   Karlene Einstein, MD  lisinopril (PRINIVIL,ZESTRIL) 40 MG tablet take 1 tablet by mouth once daily 01/02/15   Karlene Einstein, MD  loratadine (CLARITIN) 10 MG tablet Take 1 tablet (10 mg total) by mouth daily as needed for allergies. 11/29/14   Karlene Einstein, MD  Nutritional Supplements (ENSURE NUTRA SHAKE HI-CAL) LIQD Take 1 Can by mouth 2 (two) times daily. 09/12/14   Campbell Riches, MD  ondansetron (ZOFRAN ODT) 4 MG disintegrating tablet 4mg  ODT q4 hours prn nausea/vomit 09/19/14   Pamella Pert, MD  pantoprazole (PROTONIX) 40 MG tablet Take 1 tablet (40 mg total) by mouth 2 (two) times daily. Patient not taking: Reported on 02/15/2015 01/03/15   Karlene Einstein, MD  sucralfate (CARAFATE) 1 G tablet Take 1 tablet (1 g total) by mouth 4 (four) times daily. 09/08/14   Oval Linsey, MD   BP 187/75 mmHg  Pulse 54  Temp(Src) 97.7 F (36.5 C) (Oral)  Resp 18  Ht 5' 2.5" (1.588 m)  Wt 130 lb (58.968 kg)  BMI 23.38 kg/m2  SpO2 98% Physical Exam  Constitutional: She is oriented to person, place, and time. She appears well-developed and well-nourished.  HENT:  Head: Normocephalic and atraumatic.  Right Ear: External ear normal.  Left Ear: External ear normal.  Eyes: Conjunctivae and EOM are normal. Pupils are equal, round, and reactive to light.  Neck: Normal  range of motion. Neck supple.  Cardiovascular: Normal rate, regular rhythm, normal heart sounds and intact distal pulses.   Pulmonary/Chest: Effort normal and breath sounds normal.  Abdominal: Soft. Bowel sounds are normal. There is no tenderness.  Musculoskeletal: Normal range of motion.  Tenderness of R wrist, R knee, L knee  Neurological: She is alert and oriented to person, place, and time.  Skin: Skin is warm and dry.  Vitals reviewed.   ED Course  Procedures (including critical care time) Labs Review Labs Reviewed  CBC WITH DIFFERENTIAL/PLATELET - Abnormal; Notable for the following:    RBC 3.79 (*)  MCV 102.9 (*)    All other components within normal limits  BASIC METABOLIC PANEL - Abnormal; Notable for the following:    Potassium 3.3 (*)    Glucose, Bld 105 (*)    BUN 25 (*)    GFR calc non Af Amer 69 (*)    GFR calc Af Amer 80 (*)    All other components within normal limits  URINALYSIS, ROUTINE W REFLEX MICROSCOPIC - Abnormal; Notable for the following:    Hgb urine dipstick SMALL (*)    Leukocytes, UA SMALL (*)    All other components within normal limits  URINE MICROSCOPIC-ADD ON    Imaging Review No results found.   EKG Interpretation None      MDM   Final diagnoses:  Primary osteoarthritis involving multiple joints    69 y.o. female with pertinent PMH of HIV, HTN, OA presents with joint pain as above she feels is similar to prior.  Physical exam as above.  FROM.  No systemic signs.  No signs of end organ damage. Wu unremarkable.  Pt had relief of symptoms after toradol.  DC home in stable condition  I have reviewed all laboratory and imaging studies if ordered as above  1. Primary osteoarthritis involving multiple joints         Rexene Agent, MD 02/15/15 1547

## 2015-02-12 NOTE — Discharge Instructions (Signed)
Arthritis, Nonspecific °Arthritis is inflammation of a joint. This usually means pain, redness, warmth or swelling are present. One or more joints may be involved. There are a number of types of arthritis. Your caregiver may not be able to tell what type of arthritis you have right away. °CAUSES  °The most common cause of arthritis is the wear and tear on the joint (osteoarthritis). This causes damage to the cartilage, which can break down over time. The knees, hips, back and neck are most often affected by this type of arthritis. °Other types of arthritis and common causes of joint pain include: °· Sprains and other injuries near the joint. Sometimes minor sprains and injuries cause pain and swelling that develop hours later. °· Rheumatoid arthritis. This affects hands, feet and knees. It usually affects both sides of your body at the same time. It is often associated with chronic ailments, fever, weight loss and general weakness. °· Crystal arthritis. Gout and pseudo gout can cause occasional acute severe pain, redness and swelling in the foot, ankle, or knee. °· Infectious arthritis. Bacteria can get into a joint through a break in overlying skin. This can cause infection of the joint. Bacteria and viruses can also spread through the blood and affect your joints. °· Drug, infectious and allergy reactions. Sometimes joints can become mildly painful and slightly swollen with these types of illnesses. °SYMPTOMS  °· Pain is the main symptom. °· Your joint or joints can also be red, swollen and warm or hot to the touch. °· You may have a fever with certain types of arthritis, or even feel overall ill. °· The joint with arthritis will hurt with movement. Stiffness is present with some types of arthritis. °DIAGNOSIS  °Your caregiver will suspect arthritis based on your description of your symptoms and on your exam. Testing may be needed to find the type of arthritis: °· Blood and sometimes urine tests. °· X-ray tests  and sometimes CT or MRI scans. °· Removal of fluid from the joint (arthrocentesis) is done to check for bacteria, crystals or other causes. Your caregiver (or a specialist) will numb the area over the joint with a local anesthetic, and use a needle to remove joint fluid for examination. This procedure is only minimally uncomfortable. °· Even with these tests, your caregiver may not be able to tell what kind of arthritis you have. Consultation with a specialist (rheumatologist) may be helpful. °TREATMENT  °Your caregiver will discuss with you treatment specific to your type of arthritis. If the specific type cannot be determined, then the following general recommendations may apply. °Treatment of severe joint pain includes: °· Rest. °· Elevation. °· Anti-inflammatory medication (for example, ibuprofen) may be prescribed. Avoiding activities that cause increased pain. °· Only take over-the-counter or prescription medicines for pain and discomfort as recommended by your caregiver. °· Cold packs over an inflamed joint may be used for 10 to 15 minutes every hour. Hot packs sometimes feel better, but do not use overnight. Do not use hot packs if you are diabetic without your caregiver's permission. °· A cortisone shot into arthritic joints may help reduce pain and swelling. °· Any acute arthritis that gets worse over the next 1 to 2 days needs to be looked at to be sure there is no joint infection. °Long-term arthritis treatment involves modifying activities and lifestyle to reduce joint stress jarring. This can include weight loss. Also, exercise is needed to nourish the joint cartilage and remove waste. This helps keep the muscles   around the joint strong. °HOME CARE INSTRUCTIONS  °· Do not take aspirin to relieve pain if gout is suspected. This elevates uric acid levels. °· Only take over-the-counter or prescription medicines for pain, discomfort or fever as directed by your caregiver. °· Rest the joint as much as  possible. °· If your joint is swollen, keep it elevated. °· Use crutches if the painful joint is in your leg. °· Drinking plenty of fluids may help for certain types of arthritis. °· Follow your caregiver's dietary instructions. °· Try low-impact exercise such as: °¨ Swimming. °¨ Water aerobics. °¨ Biking. °¨ Walking. °· Morning stiffness is often relieved by a warm shower. °· Put your joints through regular range-of-motion. °SEEK MEDICAL CARE IF:  °· You do not feel better in 24 hours or are getting worse. °· You have side effects to medications, or are not getting better with treatment. °SEEK IMMEDIATE MEDICAL CARE IF:  °· You have a fever. °· You develop severe joint pain, swelling or redness. °· Many joints are involved and become painful and swollen. °· There is severe back pain and/or leg weakness. °· You have loss of bowel or bladder control. °Document Released: 01/02/2005 Document Revised: 02/17/2012 Document Reviewed: 01/18/2009 °[employer]® Patient Information ©2015 [employer]. This information is not intended to replace advice given to you by your health care provider. Make sure you discuss any questions you have with your health care provider. ° °

## 2015-02-12 NOTE — ED Notes (Signed)
Pt remains monitored by blood pressure and pulse ox. Pts family remains at bedside.  

## 2015-02-12 NOTE — ED Notes (Signed)
Pt complains of right arm and leg pain that has been ongoing but worsening this morning.  This is located in the right elbow, wrist, fingers, and knee.  Pt rates pain at 10/10.

## 2015-02-15 ENCOUNTER — Other Ambulatory Visit: Payer: Self-pay | Admitting: Infectious Diseases

## 2015-02-15 ENCOUNTER — Encounter: Payer: Self-pay | Admitting: Infectious Diseases

## 2015-02-15 ENCOUNTER — Ambulatory Visit (INDEPENDENT_AMBULATORY_CARE_PROVIDER_SITE_OTHER): Payer: Medicare Other | Admitting: Infectious Diseases

## 2015-02-15 VITALS — BP 208/78 | HR 50 | Temp 98.0°F | Wt 131.0 lb

## 2015-02-15 DIAGNOSIS — B182 Chronic viral hepatitis C: Secondary | ICD-10-CM | POA: Diagnosis not present

## 2015-02-15 DIAGNOSIS — I10 Essential (primary) hypertension: Secondary | ICD-10-CM | POA: Diagnosis not present

## 2015-02-15 DIAGNOSIS — B20 Human immunodeficiency virus [HIV] disease: Secondary | ICD-10-CM | POA: Diagnosis not present

## 2015-02-15 DIAGNOSIS — Z1231 Encounter for screening mammogram for malignant neoplasm of breast: Secondary | ICD-10-CM

## 2015-02-15 NOTE — Assessment & Plan Note (Signed)
She is asx but will have her seen in IM asap.

## 2015-02-15 NOTE — Progress Notes (Signed)
   Subjective:    Patient ID: Nicole Mccormick, female    DOB: 06-04-1946, 69 y.o.   MRN: [id number]  HPI 69 yo F with hx of HIV+, Hep C, adm to hospital 10- to 10-8 with HgB of 5.7, She underwent EGD and colonoscopy which did not show active bleeding. She had capsule endoscopy which suggested small AVMs. She was started on protonix and due to this her ART was changed to prezcobix (off the ATVr).  She had difficulty with med adherence and was change to DTGV/TRV 10-2014. Has had no problems with this.  She has completed her course of harvoni at this point.  Has been seen by IM for her arthritis.  States he BP has been acting up. No headaches, no CP, no SOB.   HIV 1 RNA QUANT (copies/mL)  Date Value  01/26/2015 <20  09/12/2014 <20  03/01/2014 <20   CD4 T CELL ABS (/uL)  Date Value  01/26/2015 520  09/12/2014 620  08/07/2014 510   Review of Systems See above    Objective:   Physical Exam  Constitutional: She appears well-developed and well-nourished.  HENT:  Mouth/Throat: No oropharyngeal exudate.  Eyes: EOM are normal. Pupils are equal, round, and reactive to light.  Neck: Neck supple.  Cardiovascular: Normal rate, regular rhythm and normal heart sounds.   Pulmonary/Chest: Effort normal and breath sounds normal.  Abdominal: Soft. Bowel sounds are normal. She exhibits no distension. There is no tenderness.  Lymphadenopathy:    She has no cervical adenopathy.          Assessment & Plan:

## 2015-02-15 NOTE — Assessment & Plan Note (Signed)
Has completed harvoni.  Will recheck her Hep C VL today and in 3 months.

## 2015-02-15 NOTE — Assessment & Plan Note (Signed)
She is doing well.  Needs IM f/u for BP. Offered/refused condoms.  Needs mammo, pap Will see her back in 6 months.  hopefully DTGV/TRV FDC will be available soon.

## 2015-02-16 ENCOUNTER — Telehealth: Payer: Self-pay | Admitting: Internal Medicine

## 2015-02-16 NOTE — Telephone Encounter (Signed)
Call to patient to confirm appointment for 02/17/15 at 2:45 mail box full

## 2015-02-17 ENCOUNTER — Encounter: Payer: Self-pay | Admitting: Internal Medicine

## 2015-02-17 ENCOUNTER — Ambulatory Visit (INDEPENDENT_AMBULATORY_CARE_PROVIDER_SITE_OTHER): Payer: Medicare Other | Admitting: Internal Medicine

## 2015-02-17 VITALS — BP 137/65 | HR 56 | Temp 98.0°F | Wt 132.3 lb

## 2015-02-17 DIAGNOSIS — B20 Human immunodeficiency virus [HIV] disease: Secondary | ICD-10-CM

## 2015-02-17 DIAGNOSIS — F17200 Nicotine dependence, unspecified, uncomplicated: Secondary | ICD-10-CM

## 2015-02-17 DIAGNOSIS — Z0000 Encounter for general adult medical examination without abnormal findings: Secondary | ICD-10-CM

## 2015-02-17 DIAGNOSIS — Z720 Tobacco use: Secondary | ICD-10-CM

## 2015-02-17 DIAGNOSIS — I10 Essential (primary) hypertension: Secondary | ICD-10-CM | POA: Diagnosis not present

## 2015-02-17 LAB — HEPATITIS C RNA QUANTITATIVE: HCV Quantitative: NOT DETECTED IU/mL (ref <15)

## 2015-02-17 MED ORDER — ENSURE NUTRA SHAKE HI-CAL PO LIQD: 1.0000 | Freq: Two times a day (BID) | ORAL | Status: DC

## 2015-02-17 MED ORDER — ZOSTER VACCINE LIVE 19400 UNT/0.65ML ~~LOC~~ SOLR: 0.6500 mL | Freq: Once | SUBCUTANEOUS | Status: DC

## 2015-02-17 NOTE — Progress Notes (Signed)
Case discussed with Dr. Glenn at the time of the visit.  We reviewed the resident's history and exam and pertinent patient test results.  I agree with the assessment, diagnosis, and plan of care documented in the resident's note.   

## 2015-02-17 NOTE — Patient Instructions (Signed)
Your blood pressure looks very good today. Please be sure to continue to take all of your medications.  Please be sure to keep your appointment with Dr. Sherrine Maples on 3/24.     General Instructions:   Please bring your medicines with you each time you come to clinic.  Medicines may include prescription medications, over-the-counter medications, herbal remedies, eye drops, vitamins, or other pills.   Progress Toward Treatment Goals:  Treatment Goal 03/04/2014  Blood pressure unchanged  Stop smoking smoking less  Prevent falls at goal    Self Care Goals & Plans:  Self Care Goal 11/24/2014  Manage my medications take my medicines as prescribed; refill my medications on time  Monitor my health keep track of my blood pressure  Eat healthy foods eat more vegetables; eat baked foods instead of fried foods; eat foods that are low in salt  Be physically active find an activity I enjoy; take a walk every day  Stop smoking cut down the number of cigarettes smoked  Prevent falls wear appropriate shoes    No flowsheet data found.   Care Management & Community Referrals:  Referral 03/04/2014  Referrals made for care management support none needed

## 2015-02-17 NOTE — Assessment & Plan Note (Addendum)
Pt has mammogram scheduled She is due for Prevnar 13 vaccination and Shingles vaccine but want to wait until her PCP appt 03/02/15 - Prevnar 13 at next visit - Prescription for Zostavax ordered but pt left w/o getting --> will d/c and she will need a new rx at her f/u appt

## 2015-02-17 NOTE — Progress Notes (Signed)
Patient ID: Nicole Mccormick, female   DOB: February 13, 1946, 69 y.o.   MRN: [id number]  Subjective:   Patient ID: Nicole Mccormick female   DOB: 05/02/1946 69 y.o.   MRN: [id number]  HPI: [name] is a 69 y.o. F w/ PMH HIV, Hep C, and HTN presents for a blood pressure check.  She was seen 3/9 by Dr. Johnnye Sima and her BP was significantly elevated at 208/78. She was asked to f/u with our clinic. She is currently taking chlorthalidone, amlodipine, and lisinopril for her HTN, and she is also taking Imdur.    Past Medical History  Diagnosis Date  . Hypertension   . Abnormal Pap smear   . Asthma   . Coronary artery disease   . HIV infection     diagnosed before 2008  . Varicosities   . Seizures     last sz fri nov 2  . GERD (gastroesophageal reflux disease)     previously on aciphex, discontinued november 2012  because patient asymptomatic, and concern about interference with HIV meds.  May try pepcid in the future if symptoms return  . Iron deficiency anemia     Ferritin = 2 in november 2012, started on iron supplemenation  . Arthritis   . CHF (congestive heart failure)   . Heart murmur   . Hepatitis C antibody test positive    Current Outpatient Prescriptions  Medication Sig Dispense Refill  . amLODipine (NORVASC) 10 MG tablet Take 1 tablet (10 mg total) by mouth daily. 30 tablet 11  . baclofen (LIORESAL) 10 MG tablet Take 10 mg by mouth 3 (three) times daily as needed for muscle spasms.    . chlorthalidone (HYGROTON) 25 MG tablet take 1 tablet by mouth once daily 30 tablet 1  . diazepam (VALIUM) 2 MG tablet take 1 tablet by mouth every 6 hours if needed for anxiety 30 tablet 0  . dolutegravir (TIVICAY) 50 MG tablet Take 1 tablet (50 mg total) by mouth daily. 90 tablet 3  . emtricitabine-tenofovir (TRUVADA) 200-300 MG per tablet Take 1 tablet by mouth daily. 30 tablet 6  . ferrous sulfate 325 (65 FE) MG tablet Take 1 tablet (325 mg total) by mouth 3 (three) times daily with meals. 90 tablet 3  .  gabapentin (NEURONTIN) 600 MG tablet Take 600 mg by mouth 2 (two) times daily.    . isosorbide mononitrate (IMDUR) 30 MG 24 hr tablet take 1 tablet by mouth once daily 30 tablet 3  . lisinopril (PRINIVIL,ZESTRIL) 40 MG tablet take 1 tablet by mouth once daily 30 tablet 1  . loratadine (CLARITIN) 10 MG tablet Take 1 tablet (10 mg total) by mouth daily as needed for allergies. 90 tablet 3  . Nutritional Supplements (ENSURE NUTRA SHAKE HI-CAL) LIQD Take 1 Can by mouth 2 (two) times daily. 60 Can 11  . ondansetron (ZOFRAN ODT) 4 MG disintegrating tablet 4mg  ODT q4 hours prn nausea/vomit 20 tablet 0  . pantoprazole (PROTONIX) 40 MG tablet Take 1 tablet (40 mg total) by mouth 2 (two) times daily. 60 tablet 1  . sucralfate (CARAFATE) 1 G tablet Take 1 tablet (1 g total) by mouth 4 (four) times daily. 120 tablet 11  . acyclovir (ZOVIRAX) 400 MG tablet Take 1 tablet (400 mg total) by mouth daily as needed (for flare ups). (Patient not taking: Reported on 02/17/2015) 30 tablet 3  . zoster vaccine live, PF, (ZOSTAVAX) 14481 UNT/0.65ML injection Inject 19,400 Units into the skin once. 1 each 0  No current facility-administered medications for this visit.   Family History  Problem Relation Age of Onset  . Diabetes Mother   . Ovarian cancer Sister   . Cancer Sister     ovarian and colon  . Colon cancer Neg Hx    History   Social History  . Marital Status: Single    Spouse Name: N/A  . Number of Children: N/A  . Years of Education: N/A   Social History Main Topics  . Smoking status: Current Every Day Smoker -- 1.00 packs/day for 50 years    Types: Cigarettes  . Smokeless tobacco: Never Used     Comment: trying to cut back  . Alcohol Use: 1.2 oz/week    2 Glasses of wine per week     Comment: glass of wine everyday.  . Drug Use: No  . Sexual Activity: Not on file     Comment: pt. given condoms   Other Topics Concern  . None   Social History Narrative   Review of  Systems: Constitutional: Denies fever, chills, diaphoresis, appetite change and fatigue.  HEENT: Denies photophobia, eye pain, redness, hearing loss, ear pain, congestion, sore throat, rhinorrhea, sneezing, mouth sores, trouble swallowing, neck pain, neck stiffness and tinnitus.   Respiratory: Denies SOB, DOE, cough, chest tightness,  and wheezing.   Cardiovascular: Denies chest pain, palpitations and leg swelling.  Gastrointestinal: Denies nausea, vomiting, abdominal pain, diarrhea, constipation, blood in stool and abdominal distention.  Genitourinary: Denies dysuria, urgency, frequency, hematuria, flank pain and difficulty urinating.  Endocrine: Denies: hot or cold intolerance, sweats, changes in hair or nails, polyuria, polydipsia. Musculoskeletal: Denies myalgias, back pain, joint swelling, arthralgias and gait problem.  Skin: Denies pallor, rash and wound.  Neurological: Denies dizziness, seizures, syncope, weakness, light-headedness, numbness and headaches.  Hematological: Denies adenopathy. Easy bruising, personal or family bleeding history  Psychiatric/Behavioral: Denies suicidal ideation, mood changes, confusion, nervousness, sleep disturbance and agitation  Objective:  Physical Exam: Filed Vitals:   02/17/15 1507 02/17/15 1517  BP: 157/70 137/65  Pulse: 53 56  Temp: 98 F (36.7 C)   TempSrc: Oral   Weight: 132 lb 4.8 oz (60.011 kg)   SpO2: 98%    Constitutional: Vital signs reviewed.  Patient is a well-developed and well-nourished female in no acute distress and cooperative with exam. Alert and oriented x3.  Head: Normocephalic and atraumatic Eyes: PERRL, EOMI. No scleral icterus.   Cardiovascular: RRR, no MRG Pulmonary/Chest: Normal respiratory effort, CTAB, no wheezes, rales, or rhonchi Abdominal: Soft. Non-tender, non-distended, bowel sounds are normal, no guarding present.  Musculoskeletal: No joint deformities or stiffness Neurological: A&O x3, cranial nerve II-XII are  grossly intact, no focal motor deficit.  Skin: Warm, dry and intact.  Psychiatric: Normal mood and affect. Speech and behavior is normal.   Assessment & Plan:   Please refer to Problem List based Assessment and Plan

## 2015-02-17 NOTE — Assessment & Plan Note (Signed)
  Assessment: Progress toward smoking cessation:  smoking less (1ppd) Barriers to progress toward smoking cessation:  lack of motivation to quit   Plan: Instruction/counseling given:  I counseled patient on the dangers of tobacco use, advised patient to stop smoking, and reviewed strategies to maximize success.

## 2015-02-17 NOTE — Assessment & Plan Note (Signed)
BP Readings from Last 3 Encounters:  02/17/15 137/65  02/15/15 208/78  02/12/15 187/75    Lab Results  Component Value Date   NA 139 02/12/2015   K 3.3* 02/12/2015   CREATININE 0.84 02/12/2015    Assessment: Blood pressure control: controlled Progress toward BP goal:  at goal Comments: BP dramatically better from [facility] appt. It was initially elevated on arrival to the clinic but improved on recheck.   Plan: Medications:  continue current medications Educational resources provided:   Self management tools provided:   Other plans: Keep PCP appt 03/02/15

## 2015-02-17 NOTE — Addendum Note (Signed)
Addended by: Clayburn Pert F on: 02/17/2015 04:19 PM   Modules accepted: Orders, Medications

## 2015-02-17 NOTE — Assessment & Plan Note (Signed)
Refilled rx for Ensure. Will fax to [facility]

## 2015-02-20 DIAGNOSIS — B20 Human immunodeficiency virus [HIV] disease: Secondary | ICD-10-CM | POA: Diagnosis not present

## 2015-02-21 DIAGNOSIS — B20 Human immunodeficiency virus [HIV] disease: Secondary | ICD-10-CM | POA: Diagnosis not present

## 2015-02-22 DIAGNOSIS — B20 Human immunodeficiency virus [HIV] disease: Secondary | ICD-10-CM | POA: Diagnosis not present

## 2015-02-23 ENCOUNTER — Ambulatory Visit: Payer: Medicare Other

## 2015-02-23 DIAGNOSIS — B20 Human immunodeficiency virus [HIV] disease: Secondary | ICD-10-CM | POA: Diagnosis not present

## 2015-02-24 DIAGNOSIS — B20 Human immunodeficiency virus [HIV] disease: Secondary | ICD-10-CM | POA: Diagnosis not present

## 2015-02-27 DIAGNOSIS — B20 Human immunodeficiency virus [HIV] disease: Secondary | ICD-10-CM | POA: Diagnosis not present

## 2015-02-28 DIAGNOSIS — B20 Human immunodeficiency virus [HIV] disease: Secondary | ICD-10-CM | POA: Diagnosis not present

## 2015-03-01 ENCOUNTER — Telehealth: Payer: Self-pay | Admitting: Internal Medicine

## 2015-03-01 DIAGNOSIS — B20 Human immunodeficiency virus [HIV] disease: Secondary | ICD-10-CM | POA: Diagnosis not present

## 2015-03-01 NOTE — Telephone Encounter (Signed)
Call to patient to confirm appointment for 03/02/15 at 3:15 lmtcb

## 2015-03-02 ENCOUNTER — Encounter: Payer: Medicare Other | Admitting: Internal Medicine

## 2015-03-02 DIAGNOSIS — B20 Human immunodeficiency virus [HIV] disease: Secondary | ICD-10-CM | POA: Diagnosis not present

## 2015-03-03 DIAGNOSIS — B20 Human immunodeficiency virus [HIV] disease: Secondary | ICD-10-CM | POA: Diagnosis not present

## 2015-03-04 ENCOUNTER — Other Ambulatory Visit: Payer: Self-pay | Admitting: Internal Medicine

## 2015-03-06 DIAGNOSIS — B20 Human immunodeficiency virus [HIV] disease: Secondary | ICD-10-CM | POA: Diagnosis not present

## 2015-03-07 DIAGNOSIS — B20 Human immunodeficiency virus [HIV] disease: Secondary | ICD-10-CM | POA: Diagnosis not present

## 2015-03-08 DIAGNOSIS — B20 Human immunodeficiency virus [HIV] disease: Secondary | ICD-10-CM | POA: Diagnosis not present

## 2015-03-09 DIAGNOSIS — B20 Human immunodeficiency virus [HIV] disease: Secondary | ICD-10-CM | POA: Diagnosis not present

## 2015-03-10 ENCOUNTER — Telehealth: Payer: Self-pay | Admitting: *Deleted

## 2015-03-10 DIAGNOSIS — B20 Human immunodeficiency virus [HIV] disease: Secondary | ICD-10-CM | POA: Diagnosis not present

## 2015-03-10 NOTE — Telephone Encounter (Signed)
Confirmed call and NS, left message to call me. Already a chronic NS I question why still assigned a pcp?

## 2015-03-13 ENCOUNTER — Other Ambulatory Visit: Payer: Self-pay | Admitting: Licensed Clinical Social Worker

## 2015-03-13 DIAGNOSIS — B20 Human immunodeficiency virus [HIV] disease: Secondary | ICD-10-CM

## 2015-03-13 MED ORDER — ENSURE NUTRA SHAKE HI-CAL PO LIQD: 1.0000 | Freq: Two times a day (BID) | ORAL | Status: DC

## 2015-03-14 DIAGNOSIS — B20 Human immunodeficiency virus [HIV] disease: Secondary | ICD-10-CM | POA: Diagnosis not present

## 2015-03-15 DIAGNOSIS — B20 Human immunodeficiency virus [HIV] disease: Secondary | ICD-10-CM | POA: Diagnosis not present

## 2015-03-16 DIAGNOSIS — B20 Human immunodeficiency virus [HIV] disease: Secondary | ICD-10-CM | POA: Diagnosis not present

## 2015-03-17 DIAGNOSIS — B20 Human immunodeficiency virus [HIV] disease: Secondary | ICD-10-CM | POA: Diagnosis not present

## 2015-03-20 DIAGNOSIS — B20 Human immunodeficiency virus [HIV] disease: Secondary | ICD-10-CM | POA: Diagnosis not present

## 2015-03-21 DIAGNOSIS — B20 Human immunodeficiency virus [HIV] disease: Secondary | ICD-10-CM | POA: Diagnosis not present

## 2015-03-22 DIAGNOSIS — B20 Human immunodeficiency virus [HIV] disease: Secondary | ICD-10-CM | POA: Diagnosis not present

## 2015-03-23 DIAGNOSIS — B20 Human immunodeficiency virus [HIV] disease: Secondary | ICD-10-CM | POA: Diagnosis not present

## 2015-03-24 DIAGNOSIS — B20 Human immunodeficiency virus [HIV] disease: Secondary | ICD-10-CM | POA: Diagnosis not present

## 2015-03-27 DIAGNOSIS — B20 Human immunodeficiency virus [HIV] disease: Secondary | ICD-10-CM | POA: Diagnosis not present

## 2015-03-28 DIAGNOSIS — B20 Human immunodeficiency virus [HIV] disease: Secondary | ICD-10-CM | POA: Diagnosis not present

## 2015-03-29 DIAGNOSIS — B20 Human immunodeficiency virus [HIV] disease: Secondary | ICD-10-CM | POA: Diagnosis not present

## 2015-03-30 DIAGNOSIS — B20 Human immunodeficiency virus [HIV] disease: Secondary | ICD-10-CM | POA: Diagnosis not present

## 2015-03-31 DIAGNOSIS — B20 Human immunodeficiency virus [HIV] disease: Secondary | ICD-10-CM | POA: Diagnosis not present

## 2015-04-03 DIAGNOSIS — B20 Human immunodeficiency virus [HIV] disease: Secondary | ICD-10-CM | POA: Diagnosis not present

## 2015-04-04 DIAGNOSIS — B20 Human immunodeficiency virus [HIV] disease: Secondary | ICD-10-CM | POA: Diagnosis not present

## 2015-04-05 DIAGNOSIS — B20 Human immunodeficiency virus [HIV] disease: Secondary | ICD-10-CM | POA: Diagnosis not present

## 2015-04-06 DIAGNOSIS — B20 Human immunodeficiency virus [HIV] disease: Secondary | ICD-10-CM | POA: Diagnosis not present

## 2015-04-07 DIAGNOSIS — B20 Human immunodeficiency virus [HIV] disease: Secondary | ICD-10-CM | POA: Diagnosis not present

## 2015-04-10 ENCOUNTER — Encounter: Payer: Self-pay | Admitting: *Deleted

## 2015-04-10 DIAGNOSIS — B20 Human immunodeficiency virus [HIV] disease: Secondary | ICD-10-CM | POA: Diagnosis not present

## 2015-04-11 DIAGNOSIS — B20 Human immunodeficiency virus [HIV] disease: Secondary | ICD-10-CM | POA: Diagnosis not present

## 2015-04-12 DIAGNOSIS — B20 Human immunodeficiency virus [HIV] disease: Secondary | ICD-10-CM | POA: Diagnosis not present

## 2015-04-13 DIAGNOSIS — B20 Human immunodeficiency virus [HIV] disease: Secondary | ICD-10-CM | POA: Diagnosis not present

## 2015-04-14 DIAGNOSIS — B20 Human immunodeficiency virus [HIV] disease: Secondary | ICD-10-CM | POA: Diagnosis not present

## 2015-04-17 DIAGNOSIS — B20 Human immunodeficiency virus [HIV] disease: Secondary | ICD-10-CM | POA: Diagnosis not present

## 2015-04-18 DIAGNOSIS — B20 Human immunodeficiency virus [HIV] disease: Secondary | ICD-10-CM | POA: Diagnosis not present

## 2015-04-19 DIAGNOSIS — B20 Human immunodeficiency virus [HIV] disease: Secondary | ICD-10-CM | POA: Diagnosis not present

## 2015-04-20 DIAGNOSIS — B20 Human immunodeficiency virus [HIV] disease: Secondary | ICD-10-CM | POA: Diagnosis not present

## 2015-04-21 DIAGNOSIS — B20 Human immunodeficiency virus [HIV] disease: Secondary | ICD-10-CM | POA: Diagnosis not present

## 2015-04-24 DIAGNOSIS — B20 Human immunodeficiency virus [HIV] disease: Secondary | ICD-10-CM | POA: Diagnosis not present

## 2015-04-25 DIAGNOSIS — B20 Human immunodeficiency virus [HIV] disease: Secondary | ICD-10-CM | POA: Diagnosis not present

## 2015-04-26 DIAGNOSIS — B20 Human immunodeficiency virus [HIV] disease: Secondary | ICD-10-CM | POA: Diagnosis not present

## 2015-04-27 DIAGNOSIS — B20 Human immunodeficiency virus [HIV] disease: Secondary | ICD-10-CM | POA: Diagnosis not present

## 2015-04-28 DIAGNOSIS — B20 Human immunodeficiency virus [HIV] disease: Secondary | ICD-10-CM | POA: Diagnosis not present

## 2015-05-01 DIAGNOSIS — B20 Human immunodeficiency virus [HIV] disease: Secondary | ICD-10-CM | POA: Diagnosis not present

## 2015-05-02 DIAGNOSIS — B20 Human immunodeficiency virus [HIV] disease: Secondary | ICD-10-CM | POA: Diagnosis not present

## 2015-05-03 DIAGNOSIS — B20 Human immunodeficiency virus [HIV] disease: Secondary | ICD-10-CM | POA: Diagnosis not present

## 2015-05-04 DIAGNOSIS — B20 Human immunodeficiency virus [HIV] disease: Secondary | ICD-10-CM | POA: Diagnosis not present

## 2015-05-05 ENCOUNTER — Other Ambulatory Visit: Payer: Self-pay | Admitting: Internal Medicine

## 2015-05-05 DIAGNOSIS — B20 Human immunodeficiency virus [HIV] disease: Secondary | ICD-10-CM | POA: Diagnosis not present

## 2015-05-08 DIAGNOSIS — B20 Human immunodeficiency virus [HIV] disease: Secondary | ICD-10-CM | POA: Diagnosis not present

## 2015-05-09 ENCOUNTER — Other Ambulatory Visit: Payer: Self-pay | Admitting: Internal Medicine

## 2015-05-09 DIAGNOSIS — B20 Human immunodeficiency virus [HIV] disease: Secondary | ICD-10-CM | POA: Diagnosis not present

## 2015-05-10 ENCOUNTER — Other Ambulatory Visit: Payer: Medicare Other

## 2015-05-10 DIAGNOSIS — B20 Human immunodeficiency virus [HIV] disease: Secondary | ICD-10-CM | POA: Diagnosis not present

## 2015-05-11 DIAGNOSIS — B20 Human immunodeficiency virus [HIV] disease: Secondary | ICD-10-CM | POA: Diagnosis not present

## 2015-05-12 DIAGNOSIS — B20 Human immunodeficiency virus [HIV] disease: Secondary | ICD-10-CM | POA: Diagnosis not present

## 2015-05-15 DIAGNOSIS — B20 Human immunodeficiency virus [HIV] disease: Secondary | ICD-10-CM | POA: Diagnosis not present

## 2015-05-16 DIAGNOSIS — B20 Human immunodeficiency virus [HIV] disease: Secondary | ICD-10-CM | POA: Diagnosis not present

## 2015-05-17 DIAGNOSIS — B20 Human immunodeficiency virus [HIV] disease: Secondary | ICD-10-CM | POA: Diagnosis not present

## 2015-05-18 DIAGNOSIS — B20 Human immunodeficiency virus [HIV] disease: Secondary | ICD-10-CM | POA: Diagnosis not present

## 2015-05-19 DIAGNOSIS — B20 Human immunodeficiency virus [HIV] disease: Secondary | ICD-10-CM | POA: Diagnosis not present

## 2015-05-22 ENCOUNTER — Other Ambulatory Visit: Payer: Self-pay | Admitting: Licensed Clinical Social Worker

## 2015-05-22 DIAGNOSIS — B20 Human immunodeficiency virus [HIV] disease: Secondary | ICD-10-CM | POA: Diagnosis not present

## 2015-05-22 MED ORDER — EMTRICITABINE-TENOFOVIR DF 200-300 MG PO TABS: 1.0000 | ORAL_TABLET | Freq: Every day | ORAL | Status: DC

## 2015-05-23 ENCOUNTER — Ambulatory Visit: Payer: Medicare Other | Admitting: Infectious Diseases

## 2015-05-23 DIAGNOSIS — B20 Human immunodeficiency virus [HIV] disease: Secondary | ICD-10-CM | POA: Diagnosis not present

## 2015-05-24 DIAGNOSIS — B20 Human immunodeficiency virus [HIV] disease: Secondary | ICD-10-CM | POA: Diagnosis not present

## 2015-05-25 DIAGNOSIS — B20 Human immunodeficiency virus [HIV] disease: Secondary | ICD-10-CM | POA: Diagnosis not present

## 2015-05-26 DIAGNOSIS — B20 Human immunodeficiency virus [HIV] disease: Secondary | ICD-10-CM | POA: Diagnosis not present

## 2015-05-29 DIAGNOSIS — B20 Human immunodeficiency virus [HIV] disease: Secondary | ICD-10-CM | POA: Diagnosis not present

## 2015-05-30 DIAGNOSIS — B20 Human immunodeficiency virus [HIV] disease: Secondary | ICD-10-CM | POA: Diagnosis not present

## 2015-05-31 DIAGNOSIS — B20 Human immunodeficiency virus [HIV] disease: Secondary | ICD-10-CM | POA: Diagnosis not present

## 2015-06-01 DIAGNOSIS — B20 Human immunodeficiency virus [HIV] disease: Secondary | ICD-10-CM | POA: Diagnosis not present

## 2015-06-02 DIAGNOSIS — B20 Human immunodeficiency virus [HIV] disease: Secondary | ICD-10-CM | POA: Diagnosis not present

## 2015-06-19 ENCOUNTER — Other Ambulatory Visit: Payer: Self-pay | Admitting: Infectious Diseases

## 2015-06-19 DIAGNOSIS — I10 Essential (primary) hypertension: Secondary | ICD-10-CM | POA: Diagnosis not present

## 2015-06-20 DIAGNOSIS — I10 Essential (primary) hypertension: Secondary | ICD-10-CM | POA: Diagnosis not present

## 2015-06-21 DIAGNOSIS — I10 Essential (primary) hypertension: Secondary | ICD-10-CM | POA: Diagnosis not present

## 2015-06-22 DIAGNOSIS — I10 Essential (primary) hypertension: Secondary | ICD-10-CM | POA: Diagnosis not present

## 2015-06-23 DIAGNOSIS — I10 Essential (primary) hypertension: Secondary | ICD-10-CM | POA: Diagnosis not present

## 2015-06-26 DIAGNOSIS — I10 Essential (primary) hypertension: Secondary | ICD-10-CM | POA: Diagnosis not present

## 2015-06-27 DIAGNOSIS — I10 Essential (primary) hypertension: Secondary | ICD-10-CM | POA: Diagnosis not present

## 2015-06-28 DIAGNOSIS — I10 Essential (primary) hypertension: Secondary | ICD-10-CM | POA: Diagnosis not present

## 2015-06-29 DIAGNOSIS — I10 Essential (primary) hypertension: Secondary | ICD-10-CM | POA: Diagnosis not present

## 2015-06-30 ENCOUNTER — Encounter: Payer: Self-pay | Admitting: *Deleted

## 2015-06-30 DIAGNOSIS — I10 Essential (primary) hypertension: Secondary | ICD-10-CM | POA: Diagnosis not present

## 2015-07-03 DIAGNOSIS — I10 Essential (primary) hypertension: Secondary | ICD-10-CM | POA: Diagnosis not present

## 2015-07-04 DIAGNOSIS — I10 Essential (primary) hypertension: Secondary | ICD-10-CM | POA: Diagnosis not present

## 2015-07-05 DIAGNOSIS — I10 Essential (primary) hypertension: Secondary | ICD-10-CM | POA: Diagnosis not present

## 2015-07-06 DIAGNOSIS — I10 Essential (primary) hypertension: Secondary | ICD-10-CM | POA: Diagnosis not present

## 2015-07-07 DIAGNOSIS — I10 Essential (primary) hypertension: Secondary | ICD-10-CM | POA: Diagnosis not present

## 2015-07-10 DIAGNOSIS — I10 Essential (primary) hypertension: Secondary | ICD-10-CM | POA: Diagnosis not present

## 2015-07-11 ENCOUNTER — Other Ambulatory Visit: Payer: Self-pay | Admitting: Internal Medicine

## 2015-07-11 DIAGNOSIS — I10 Essential (primary) hypertension: Secondary | ICD-10-CM

## 2015-07-12 ENCOUNTER — Encounter: Payer: Self-pay | Admitting: Internal Medicine

## 2015-07-12 ENCOUNTER — Ambulatory Visit: Payer: Medicare Other | Admitting: Internal Medicine

## 2015-07-12 DIAGNOSIS — I10 Essential (primary) hypertension: Secondary | ICD-10-CM | POA: Diagnosis not present

## 2015-07-13 DIAGNOSIS — I10 Essential (primary) hypertension: Secondary | ICD-10-CM | POA: Diagnosis not present

## 2015-07-14 DIAGNOSIS — I10 Essential (primary) hypertension: Secondary | ICD-10-CM | POA: Diagnosis not present

## 2015-07-17 ENCOUNTER — Telehealth: Payer: Self-pay | Admitting: Internal Medicine

## 2015-07-17 DIAGNOSIS — I10 Essential (primary) hypertension: Secondary | ICD-10-CM | POA: Diagnosis not present

## 2015-07-17 NOTE — Telephone Encounter (Signed)
Requests came erequest but were an error, they have now been sent to md

## 2015-07-17 NOTE — Telephone Encounter (Signed)
Pt called requesting bp med to be filled @ [facility].

## 2015-07-18 DIAGNOSIS — I10 Essential (primary) hypertension: Secondary | ICD-10-CM | POA: Diagnosis not present

## 2015-07-19 DIAGNOSIS — I10 Essential (primary) hypertension: Secondary | ICD-10-CM | POA: Diagnosis not present

## 2015-07-19 MED ORDER — AMLODIPINE BESYLATE 10 MG PO TABS: 10.0000 mg | ORAL_TABLET | Freq: Every day | ORAL | Status: DC

## 2015-07-20 DIAGNOSIS — I10 Essential (primary) hypertension: Secondary | ICD-10-CM | POA: Diagnosis not present

## 2015-07-21 ENCOUNTER — Encounter: Payer: Self-pay | Admitting: Internal Medicine

## 2015-07-21 ENCOUNTER — Ambulatory Visit (INDEPENDENT_AMBULATORY_CARE_PROVIDER_SITE_OTHER): Payer: Medicare Other | Admitting: Internal Medicine

## 2015-07-21 VITALS — BP 209/95 | HR 51 | Temp 98.0°F | Wt 130.1 lb

## 2015-07-21 DIAGNOSIS — F17210 Nicotine dependence, cigarettes, uncomplicated: Secondary | ICD-10-CM

## 2015-07-21 DIAGNOSIS — F419 Anxiety disorder, unspecified: Secondary | ICD-10-CM

## 2015-07-21 DIAGNOSIS — I10 Essential (primary) hypertension: Secondary | ICD-10-CM

## 2015-07-21 DIAGNOSIS — B20 Human immunodeficiency virus [HIV] disease: Secondary | ICD-10-CM | POA: Diagnosis not present

## 2015-07-21 DIAGNOSIS — F17200 Nicotine dependence, unspecified, uncomplicated: Secondary | ICD-10-CM

## 2015-07-21 DIAGNOSIS — J4540 Moderate persistent asthma, uncomplicated: Secondary | ICD-10-CM

## 2015-07-21 DIAGNOSIS — J4541 Moderate persistent asthma with (acute) exacerbation: Secondary | ICD-10-CM

## 2015-07-21 DIAGNOSIS — J45909 Unspecified asthma, uncomplicated: Secondary | ICD-10-CM | POA: Diagnosis not present

## 2015-07-21 DIAGNOSIS — J302 Other seasonal allergic rhinitis: Secondary | ICD-10-CM

## 2015-07-21 DIAGNOSIS — Z13820 Encounter for screening for osteoporosis: Secondary | ICD-10-CM

## 2015-07-21 DIAGNOSIS — Z1239 Encounter for other screening for malignant neoplasm of breast: Secondary | ICD-10-CM

## 2015-07-21 DIAGNOSIS — Z0000 Encounter for general adult medical examination without abnormal findings: Secondary | ICD-10-CM

## 2015-07-21 MED ORDER — PNEUMOCOCCAL 13-VAL CONJ VACC IM SUSP: 0.5000 mL | INTRAMUSCULAR | Status: DC

## 2015-07-21 MED ORDER — MOMETASONE FUROATE 220 MCG/INH IN AEPB: 1.0000 | INHALATION_SPRAY | Freq: Two times a day (BID) | RESPIRATORY_TRACT | Status: DC

## 2015-07-21 MED ORDER — ZOSTAVAX 19400 UNT/0.65ML ~~LOC~~ SOLR: 0.6500 mL | Freq: Once | SUBCUTANEOUS | Status: DC

## 2015-07-21 MED ORDER — ALBUTEROL SULFATE (2.5 MG/3ML) 0.083% IN NEBU: 2.5000 mg | INHALATION_SOLUTION | Freq: Four times a day (QID) | RESPIRATORY_TRACT | Status: DC | PRN

## 2015-07-21 MED ORDER — ALBUTEROL SULFATE HFA 108 (90 BASE) MCG/ACT IN AERS
2.0000 | INHALATION_SPRAY | Freq: Four times a day (QID) | RESPIRATORY_TRACT | Status: DC | PRN
Start: 2015-07-21 — End: 2015-11-13

## 2015-07-21 MED ORDER — DIAZEPAM 2 MG PO TABS: ORAL_TABLET | ORAL | Status: DC

## 2015-07-21 MED ORDER — LORATADINE 10 MG PO TABS: 10.0000 mg | ORAL_TABLET | Freq: Every day | ORAL | Status: DC | PRN

## 2015-07-21 NOTE — Patient Instructions (Signed)
Nicole Mccormick it was nice meeting you today. Please start taking your blood pressure medications again. In addition, I have prescribed medicines for your asthma. Please return for a follow up in 1-2 weeks.

## 2015-07-22 DIAGNOSIS — J45909 Unspecified asthma, uncomplicated: Secondary | ICD-10-CM | POA: Insufficient documentation

## 2015-07-22 DIAGNOSIS — J302 Other seasonal allergic rhinitis: Secondary | ICD-10-CM | POA: Insufficient documentation

## 2015-07-22 DIAGNOSIS — F419 Anxiety disorder, unspecified: Secondary | ICD-10-CM | POA: Insufficient documentation

## 2015-07-22 NOTE — Assessment & Plan Note (Signed)
Valium prescription refilled today for occasional anxiety. Patient has been informed that she needs to sign a pain contract if she needs this medication in the future.

## 2015-07-22 NOTE — Assessment & Plan Note (Signed)
Patient is interested in quitting, however, states she has tried nicotine patches in the past and they make her hallucinate.   -BP is significantly elevated at this visit. The goal is to control her BP at this visit.  -I discussed starting patient on Wellbutrin or Chantix at her next visit.

## 2015-07-22 NOTE — Progress Notes (Signed)
Patient ID: Nicole Mccormick, female   DOB: 09-16-1946, 69 y.o.   MRN: [id number]   Subjective:   Patient ID: Nicole Mccormick female   DOB: 09/10/1946 69 y.o.   MRN: [id number]  HPI: [name] is a 69 y.o. F with a PMHx of HIV, Hep C, CAD, HTN presenting to the clinic for a routine follow up. Pt states she has a hx of asthma since 1962 and has been hospitalized (no intubation) 3 times in the past, most recent in Spring of this year. States she has used Albuterol in the past for her Asthma. At present, pt complaining of wheezing, cough, and dyspnea at night 5 times per week. States she is okay when indoor but becomes symptomatic when she is outside. States she cannot walk more 1/2 block and is unable to climb stairs due to her symptoms. Denies any fevers, chills, or recent illness.     Past Medical History  Diagnosis Date  . Hypertension   . Abnormal Pap smear   . Asthma   . Coronary artery disease   . HIV infection     diagnosed before 2008  . Varicosities   . Seizures     last sz fri nov 2  . GERD (gastroesophageal reflux disease)     previously on aciphex, discontinued november 2012  because patient asymptomatic, and concern about interference with HIV meds.  May try pepcid in the future if symptoms return  . Iron deficiency anemia     Ferritin = 2 in november 2012, started on iron supplemenation  . Arthritis   . CHF (congestive heart failure)   . Heart murmur   . Hepatitis C antibody test positive    Current Outpatient Prescriptions  Medication Sig Dispense Refill  . acyclovir (ZOVIRAX) 400 MG tablet Take 1 tablet (400 mg total) by mouth daily as needed (for flare ups). 30 tablet 3  . amLODipine (NORVASC) 10 MG tablet Take 1 tablet (10 mg total) by mouth daily. 90 tablet 1  . baclofen (LIORESAL) 10 MG tablet Take 10 mg by mouth 3 (three) times daily as needed for muscle spasms.    . chlorthalidone (HYGROTON) 25 MG tablet take 1 tablet by mouth once daily 90 tablet 1  . diazepam (VALIUM)  2 MG tablet take 1 tablet by mouth every 6 hours if needed for anxiety 30 tablet 0  . dolutegravir (TIVICAY) 50 MG tablet Take 1 tablet (50 mg total) by mouth daily. 90 tablet 3  . emtricitabine-tenofovir (TRUVADA) 200-300 MG per tablet Take 1 tablet by mouth daily. 30 tablet 6  . ferrous sulfate 325 (65 FE) MG tablet Take 1 tablet (325 mg total) by mouth 3 (three) times daily with meals. 90 tablet 3  . gabapentin (NEURONTIN) 600 MG tablet Take 600 mg by mouth 2 (two) times daily.    . isosorbide mononitrate (IMDUR) 30 MG 24 hr tablet take 1 tablet by mouth once daily 30 tablet 3  . lisinopril (PRINIVIL,ZESTRIL) 40 MG tablet take 1 tablet by mouth once daily 90 tablet 1  . loratadine (CLARITIN) 10 MG tablet Take 1 tablet (10 mg total) by mouth daily as needed for allergies. 90 tablet 3  . Nutritional Supplements (ENSURE NUTRA SHAKE HI-CAL) LIQD Take 1 Can by mouth 2 (two) times daily. 60 Can 11  . ondansetron (ZOFRAN ODT) 4 MG disintegrating tablet 4mg  ODT q4 hours prn nausea/vomit 20 tablet 0  . pantoprazole (PROTONIX) 40 MG tablet take 1 tablet by mouth twice a day  180 tablet 1  . sucralfate (CARAFATE) 1 G tablet Take 1 tablet (1 g total) by mouth 4 (four) times daily. 120 tablet 11  . albuterol (PROVENTIL HFA;VENTOLIN HFA) 108 (90 BASE) MCG/ACT inhaler Inhale 2 puffs into the lungs every 6 (six) hours as needed for wheezing or shortness of breath. 1 Inhaler 2  . albuterol (PROVENTIL) (2.5 MG/3ML) 0.083% nebulizer solution Take 3 mLs (2.5 mg total) by nebulization every 6 (six) hours as needed for wheezing or shortness of breath. 75 mL 12  . mometasone (ASMANEX) 220 MCG/INH inhaler Inhale 1 puff into the lungs 2 (two) times daily. 1 Inhaler 2  . pneumococcal 13-valent conjugate vaccine (PREVNAR 13) SUSP injection Inject 0.5 mLs into the muscle tomorrow at 10 am. 0.5 mL 0  . ZOSTAVAX 55732 UNT/0.65ML injection Inject 19,400 Units into the skin once. 1 each 0   No current facility-administered  medications for this visit.   Family History  Problem Relation Age of Onset  . Diabetes Mother   . Ovarian cancer Sister   . Cancer Sister     ovarian and colon  . Colon cancer Neg Hx    Social History   Social History  . Marital Status: Single    Spouse Name: N/A  . Number of Children: N/A  . Years of Education: N/A   Social History Main Topics  . Smoking status: Current Every Day Smoker -- 0.75 packs/day for 50 years    Types: Cigarettes  . Smokeless tobacco: Never Used     Comment: trying to cut back  . Alcohol Use: 1.2 oz/week    2 Glasses of wine per week     Comment: glass of wine everyday.  . Drug Use: No  . Sexual Activity: Not Asked     Comment: pt. given condoms   Other Topics Concern  . None   Social History Narrative   Review of Systems: Review of Systems  Constitutional: Negative for fever, chills and malaise/fatigue.  HENT: Negative for ear pain.   Eyes: Negative for blurred vision and pain.  Respiratory: Positive for cough, shortness of breath and wheezing.   Cardiovascular: Negative for chest pain, palpitations and leg swelling.  Gastrointestinal: Negative for nausea, vomiting, abdominal pain, diarrhea and constipation.  Genitourinary: Negative.   Musculoskeletal: Negative.   Skin: Negative for itching and rash.  Neurological: Negative for dizziness, sensory change, focal weakness and headaches.    Objective:  Physical Exam: Filed Vitals:   07/21/15 1317 07/21/15 1403  BP: 239/86 209/95  Pulse: 52 51  Temp: 98 F (36.7 C)   TempSrc: Oral   Weight: 130 lb 1.6 oz (59.013 kg)   SpO2: 99%    Physical Exam  Constitutional: She is oriented to person, place, and time. She appears well-developed and well-nourished. No distress.  HENT:  Head: Normocephalic and atraumatic.  Eyes: EOM are normal. Pupils are equal, round, and reactive to light.  Neck: Neck supple. No tracheal deviation present.  Cardiovascular: Normal rate, regular rhythm and  intact distal pulses.   Murmur heard. Systolic ejection murmur at the right upper sternal border  Pulmonary/Chest: Effort normal. No respiratory distress. She has no wheezes. She has no rales.  Abdominal: Soft. Bowel sounds are normal. She exhibits no distension. There is no tenderness.  Musculoskeletal: Normal range of motion. She exhibits no edema or tenderness.  Neurological: She is alert and oriented to person, place, and time.  Skin: Skin is warm and dry.  Psychiatric: She has a normal  mood and affect.    Assessment & Plan:

## 2015-07-22 NOTE — Assessment & Plan Note (Signed)
Patient states she is compliant with her medications and follows up with Dr. Johnnye Sima in [place]. Next appt in October.

## 2015-07-22 NOTE — Assessment & Plan Note (Signed)
-  mammo -Prevnar 13 -Given prescription for Zostavax -Dexa scan

## 2015-07-22 NOTE — Assessment & Plan Note (Addendum)
Patient's BP today was 239/86, repeat 209/95. States she ran out of her BP medications 2 weeks ago. She had an appointment here on 07/12/15 but missed it because she was in [place]. States she went to a pharmacy there but they refused to give her any pills. States her systolic BP at home is usually 160-179 while taking medications. Denies any headaches, changes in vision, CP, SOB, or changes in urination. When asked about diet, patient states she enjoys eating salty foods such as pork.  -I informed the patient that her BP medications have already been refilled on 07/19/15: Amlodipine 10 mg qd,  Chlorthalidone 25 mg qd, Lisinopril 40 mg qd. She was advised to go her pharmacy urgently to pick them up so that she can start taking them this afternoon.  -She was given a BP log today and asked to record her home readings.  -F/u in 1-2 weeks. If BP continues to be elevated at next visit, consider adjusting dose of medications.

## 2015-07-22 NOTE — Assessment & Plan Note (Signed)
Claritin prescription refilled today.

## 2015-07-22 NOTE — Assessment & Plan Note (Addendum)
Patient has a longstanding hx of Asthma with 3 hospitalizations. States she has used Albuterol in the past. At present she is complaining of wheezing, cough, and dyspnea at night 5 times per week. Symptomatic when outside and walking just 1/2 block. She is unable to climb stairs due to her symptoms. Patient is a current smoker and CT of lungs from 2014 shows mild centrulobular emphysema. Her symptoms are most likely 2/2 moderate persistent asthma but COPD is also on the differential considering longstanding smoking history and CT findings.  -PFTs ordered today -Albuterol MDI 2 puffs q6 prn -Albuterol nebulizer solution 0.083% - 2.5 mg q6 prn  -Machine for nebulizer  -Inhaled corticosteroid Asmanex 1 puff bid -F/u visit in 1-2 weeks

## 2015-07-24 DIAGNOSIS — I10 Essential (primary) hypertension: Secondary | ICD-10-CM | POA: Diagnosis not present

## 2015-07-24 NOTE — Progress Notes (Signed)
Internal Medicine Clinic Attending  I saw and evaluated the patient.  I personally confirmed the key portions of the history and exam documented by Dr. Rathore and I reviewed pertinent patient test results.  The assessment, diagnosis, and plan were formulated together and I agree with the documentation in the resident's note.  

## 2015-07-25 DIAGNOSIS — I10 Essential (primary) hypertension: Secondary | ICD-10-CM | POA: Diagnosis not present

## 2015-07-26 DIAGNOSIS — I10 Essential (primary) hypertension: Secondary | ICD-10-CM | POA: Diagnosis not present

## 2015-07-27 DIAGNOSIS — I10 Essential (primary) hypertension: Secondary | ICD-10-CM | POA: Diagnosis not present

## 2015-07-28 DIAGNOSIS — I10 Essential (primary) hypertension: Secondary | ICD-10-CM | POA: Diagnosis not present

## 2015-07-31 ENCOUNTER — Ambulatory Visit: Payer: Medicare Other | Admitting: Internal Medicine

## 2015-07-31 DIAGNOSIS — I10 Essential (primary) hypertension: Secondary | ICD-10-CM | POA: Diagnosis not present

## 2015-08-01 ENCOUNTER — Encounter (HOSPITAL_COMMUNITY): Payer: Medicare Other

## 2015-08-01 DIAGNOSIS — I10 Essential (primary) hypertension: Secondary | ICD-10-CM | POA: Diagnosis not present

## 2015-08-02 DIAGNOSIS — I10 Essential (primary) hypertension: Secondary | ICD-10-CM | POA: Diagnosis not present

## 2015-08-03 ENCOUNTER — Ambulatory Visit (HOSPITAL_COMMUNITY)
Admission: RE | Admit: 2015-08-03 | Discharge: 2015-08-03 | Disposition: A | Discharge status: Home / Self Care | Payer: Medicare Other | Source: Ambulatory Visit | Attending: Internal Medicine | Admitting: Internal Medicine

## 2015-08-03 ENCOUNTER — Telehealth: Payer: Self-pay | Admitting: Internal Medicine

## 2015-08-03 DIAGNOSIS — J4541 Moderate persistent asthma with (acute) exacerbation: Secondary | ICD-10-CM | POA: Insufficient documentation

## 2015-08-03 DIAGNOSIS — I10 Essential (primary) hypertension: Secondary | ICD-10-CM | POA: Diagnosis not present

## 2015-08-03 LAB — PULMONARY FUNCTION TEST
DL/VA % pred: 79 %
DL/VA: 3.74 ml/min/mmHg/L
DLCO unc % pred: 43 %
DLCO unc: 9.96 ml/min/mmHg
FEF 25-75 Post: 0.52 L/sec
FEF 25-75 Pre: 0.78 L/sec
FEF2575-%Change-Post: -32 %
FEF2575-%Pred-Post: 33 %
FEF2575-%Pred-Pre: 49 %
FEV1-%Change-Post: -2 %
FEV1-%Pred-Post: 64 %
FEV1-%Pred-Pre: 66 %
FEV1-Post: 1.11 L
FEV1-Pre: 1.14 L
FEV1FVC-%Change-Post: 4 %
FEV1FVC-%Pred-Pre: 83 %
FEV6-%Change-Post: -9 %
FEV6-%Pred-Post: 72 %
FEV6-%Pred-Pre: 79 %
FEV6-Post: 1.54 L
FEV6-Pre: 1.7 L
FEV6FVC-%Change-Post: 5 %
FEV6FVC-%Pred-Post: 104 %
FEV6FVC-%Pred-Pre: 99 %
FVC-%Change-Post: -7 %
FVC-%Pred-Post: 74 %
FVC-%Pred-Pre: 80 %
FVC-Post: 1.65 L
FVC-Pre: 1.79 L
Post FEV1/FVC ratio: 67 %
Post FEV6/FVC ratio: 100 %
Pre FEV1/FVC ratio: 64 %
Pre FEV6/FVC Ratio: 95 %
RV % pred: 88 %
RV: 1.91 L
TLC % pred: 75 %
TLC: 3.7 L

## 2015-08-03 MED ORDER — ALBUTEROL SULFATE (2.5 MG/3ML) 0.083% IN NEBU
2.5000 mg | INHALATION_SOLUTION | Freq: Once | RESPIRATORY_TRACT | Status: AC
  Administered 2015-08-03: 2.5 mg via RESPIRATORY_TRACT

## 2015-08-03 NOTE — Telephone Encounter (Signed)
Contacted [facility] Rep in regards to [employer].  Refaxed order to new Number with [employer] [phone].

## 2015-08-04 DIAGNOSIS — I10 Essential (primary) hypertension: Secondary | ICD-10-CM | POA: Diagnosis not present

## 2015-08-07 ENCOUNTER — Encounter: Payer: Self-pay | Admitting: Internal Medicine

## 2015-08-07 ENCOUNTER — Ambulatory Visit (INDEPENDENT_AMBULATORY_CARE_PROVIDER_SITE_OTHER): Payer: Medicare Other | Admitting: Internal Medicine

## 2015-08-07 VITALS — BP 155/60 | HR 47 | Temp 97.9°F | Ht 60.0 in | Wt 129.6 lb

## 2015-08-07 DIAGNOSIS — Z23 Encounter for immunization: Secondary | ICD-10-CM | POA: Diagnosis not present

## 2015-08-07 DIAGNOSIS — J188 Other pneumonia, unspecified organism: Secondary | ICD-10-CM | POA: Diagnosis not present

## 2015-08-07 DIAGNOSIS — L989 Disorder of the skin and subcutaneous tissue, unspecified: Secondary | ICD-10-CM | POA: Diagnosis not present

## 2015-08-07 DIAGNOSIS — K219 Gastro-esophageal reflux disease without esophagitis: Secondary | ICD-10-CM

## 2015-08-07 DIAGNOSIS — I10 Essential (primary) hypertension: Secondary | ICD-10-CM

## 2015-08-07 DIAGNOSIS — J45909 Unspecified asthma, uncomplicated: Secondary | ICD-10-CM

## 2015-08-07 DIAGNOSIS — B20 Human immunodeficiency virus [HIV] disease: Secondary | ICD-10-CM | POA: Diagnosis not present

## 2015-08-07 DIAGNOSIS — J4541 Moderate persistent asthma with (acute) exacerbation: Secondary | ICD-10-CM | POA: Diagnosis not present

## 2015-08-07 DIAGNOSIS — I2510 Atherosclerotic heart disease of native coronary artery without angina pectoris: Secondary | ICD-10-CM | POA: Diagnosis not present

## 2015-08-07 DIAGNOSIS — J4540 Moderate persistent asthma, uncomplicated: Secondary | ICD-10-CM | POA: Diagnosis not present

## 2015-08-07 DIAGNOSIS — Z7951 Long term (current) use of inhaled steroids: Secondary | ICD-10-CM

## 2015-08-07 NOTE — Progress Notes (Signed)
The patient left before I could see her. I discussed the case with Dr Marlowe Sax. Dr Marlowe Sax will call the patient and we will fix another appointment for next week hopefully.   For asthma, I think more benefit might be observed if the patient starts doing nebulization with her own machine, and adding combination momentasone-formoterol instead of momentasone alone might be helpful.   For hypertension, I would reassess next visit, however she has great improvement in her pressures from last visit.  For uncontrolled GERD, this patient needs a GI referral, which can be discussed with the patient over the phone and ordered for this visit. Madilyn Fireman MD MPH 08/07/2015 11:04 AM

## 2015-08-08 DIAGNOSIS — I10 Essential (primary) hypertension: Secondary | ICD-10-CM | POA: Diagnosis not present

## 2015-08-08 DIAGNOSIS — L989 Disorder of the skin and subcutaneous tissue, unspecified: Secondary | ICD-10-CM | POA: Insufficient documentation

## 2015-08-08 NOTE — Assessment & Plan Note (Signed)
Patient was started on Albuterol MDI, Albuterol nebulizer, and corticosteroid Asmanex during her previous visit. Today she reports feeling much better and denies SOB. States she is waiting to get her nebulizer machine by mail today and has been using her grandson's pediatric machine all this time. States she is using her rescue inhaler three times daily. No wheezing on physical exam.  -She left before finishing her visit today. However, I called her the same day (08/07/15) at 11:08 am and asked her to start using her nebulizer machine and return in a week. Reevaluate to see if her symptoms have improved and rescue inhaler use has decreased. -PFT results later came back showing mild-moderate obstructive lung disease, decreased diffusion capacity, and some restrictive defect. Pre bronchodilator FEV1/FVC is 64% and post bronchodilator 67%. As such, symptoms less likely due to Asthma. Patient is a long-standing smoker and her PFTs are c/w COPD (emphysema). -Next visit: consider discontinuing inhaled corticosteroid because it is not indicated for initial mgmt of mild-moderate COPD. Instead, consider starting patient on an anti-cholinergic agent (Tiotropium). Continue SABA prn. If patient still continues to have SOB in the future, consider adding a long-acting beta agonist to the regimen.

## 2015-08-08 NOTE — Assessment & Plan Note (Signed)
Patient mentioned being out in the woods and noticing 3 raised skin lesions on the R side of her chest which are itchy. The lesions appeared slightly raised, non-erythematous, and non-tender. Similar in appearance to insect/ mosquito bites. Patient stated she was already using calamine lotion and hydrocortisone cream but the area was still itching. I suggested she try OTC Benadryl cream instead.  -Patient left before finishing her visit today and I did not have an opportunity to look further into this complaint. -Reevaluate at next visit in 1 week.

## 2015-08-08 NOTE — Assessment & Plan Note (Signed)
Patient's BP was 209/95 during previous visit 2/2 non-compliance. At present patient states she has been taking her medications regularly and her BP today is 155/60 and pulse 47. Goal <140/90. She denies any HA, CP, or SOB.  -Continue current mgmt: Amlodipine 10 mg qd, Chlorthalidone 25 mg qd, Lisinopril 40 mg qd, Imdur 30 mg qd -If BP continues to be elevated at next visit in 1 week, consider increasing Imdur to 60 mg qd. I do not feel comfortable adding a beta blocker to her regimen because she is bradycardic.

## 2015-08-08 NOTE — Assessment & Plan Note (Addendum)
Patient states she has always had acid reflux and is currently complaining of a 3-4 day hx of nausea a/w epigatric abdominal pain and a bitter taste in her mouth. Denies any fevers, chills, vomiting, diarrhea, or constipation. Denies hematemesis, hematochezia, or melena. Colonoscopy from 09/2014 was negative for a source of IDA. Hgb stable at 12.9 in 02/2015. She is currently on Protonix 40 mg BID and Sucralfate 1 g QID. Patient seems to have uncontrolled GERD.  -GI referral for diagnostic EGD. F/u results. Since patient left before she was seen by an attending physician, I called her on 08/07/15 at 11:08 am to inform her of the referral and she agreed. In addition, I asked her to return to the clinic within 1 week.  -Left message for front desk to schedule patient for another visit within 1 week.

## 2015-08-08 NOTE — Progress Notes (Signed)
Patient ID: Nicole Mccormick, female   DOB: 1946-12-02, 69 y.o.   MRN: [id number]   Subjective:   Patient ID: Nicole Mccormick female   DOB: 11/15/1946 69 y.o.   MRN: [id number]  HPI: Nicole Mccormick is a 69 y.o. F with a PMHx of HIV, Hep C, CAD, and HTN presenting to the clinic for a routine follow up. Please refer to problem based charting for details. Of note: Patient left the clinic before she could be seen by the attending physician.  Patient was here today to discuss GERD and other chronic medical issues but also requested for her Vicodin to be refilled (last prescribed in 2014). When I asked her about pain she did not have a clear answer and could not tell me where the pain was located. Patient stated she did not have any pain at present but wanted to have the medicine on hand for mild occasional body aches and pains. I suggested she take OTC Tylenol and suggested she contact me at the clinic if she had pain in the future.     Past Medical History  Diagnosis Date  . Hypertension   . Abnormal Pap smear   . Asthma   . Coronary artery disease   . HIV infection     diagnosed before 2008  . Varicosities   . Seizures     last sz fri nov 2  . GERD (gastroesophageal reflux disease)     previously on aciphex, discontinued november 2012  because patient asymptomatic, and concern about interference with HIV meds.  May try pepcid in the future if symptoms return  . Iron deficiency anemia     Ferritin = 2 in november 2012, started on iron supplemenation  . Arthritis   . CHF (congestive heart failure)   . Heart murmur   . Hepatitis C antibody test positive    Current Outpatient Prescriptions  Medication Sig Dispense Refill  . acyclovir (ZOVIRAX) 400 MG tablet Take 1 tablet (400 mg total) by mouth daily as needed (for flare ups). 30 tablet 3  . albuterol (PROVENTIL HFA;VENTOLIN HFA) 108 (90 BASE) MCG/ACT inhaler Inhale 2 puffs into the lungs every 6 (six) hours as needed for wheezing or shortness of  breath. 1 Inhaler 2  . albuterol (PROVENTIL) (2.5 MG/3ML) 0.083% nebulizer solution Take 3 mLs (2.5 mg total) by nebulization every 6 (six) hours as needed for wheezing or shortness of breath. 75 mL 12  . amLODipine (NORVASC) 10 MG tablet Take 1 tablet (10 mg total) by mouth daily. 90 tablet 1  . baclofen (LIORESAL) 10 MG tablet Take 10 mg by mouth 3 (three) times daily as needed for muscle spasms.    . chlorthalidone (HYGROTON) 25 MG tablet take 1 tablet by mouth once daily 90 tablet 1  . diazepam (VALIUM) 2 MG tablet take 1 tablet by mouth every 6 hours if needed for anxiety 30 tablet 0  . dolutegravir (TIVICAY) 50 MG tablet Take 1 tablet (50 mg total) by mouth daily. 90 tablet 3  . emtricitabine-tenofovir (TRUVADA) 200-300 MG per tablet Take 1 tablet by mouth daily. 30 tablet 6  . ferrous sulfate 325 (65 FE) MG tablet Take 1 tablet (325 mg total) by mouth 3 (three) times daily with meals. 90 tablet 3  . gabapentin (NEURONTIN) 600 MG tablet Take 600 mg by mouth 2 (two) times daily.    . isosorbide mononitrate (IMDUR) 30 MG 24 hr tablet take 1 tablet by mouth once daily 30 tablet 3  .  lisinopril (PRINIVIL,ZESTRIL) 40 MG tablet take 1 tablet by mouth once daily 90 tablet 1  . loratadine (CLARITIN) 10 MG tablet Take 1 tablet (10 mg total) by mouth daily as needed for allergies. 90 tablet 3  . mometasone (ASMANEX) 220 MCG/INH inhaler Inhale 1 puff into the lungs 2 (two) times daily. 1 Inhaler 2  . Nutritional Supplements (ENSURE NUTRA SHAKE HI-CAL) LIQD Take 1 Can by mouth 2 (two) times daily. 60 Can 11  . ondansetron (ZOFRAN ODT) 4 MG disintegrating tablet 4mg  ODT q4 hours prn nausea/vomit 20 tablet 0  . pantoprazole (PROTONIX) 40 MG tablet take 1 tablet by mouth twice a day 180 tablet 1  . pneumococcal 13-valent conjugate vaccine (PREVNAR 13) SUSP injection Inject 0.5 mLs into the muscle tomorrow at 10 am. 0.5 mL 0  . sucralfate (CARAFATE) 1 G tablet Take 1 tablet (1 g total) by mouth 4 (four)  times daily. 120 tablet 11  . ZOSTAVAX 66294 UNT/0.65ML injection Inject 19,400 Units into the skin once. 1 each 0   No current facility-administered medications for this visit.   Family History  Problem Relation Age of Onset  . Diabetes Mother   . Ovarian cancer Sister   . Cancer Sister     ovarian and colon  . Colon cancer Neg Hx    Social History   Social History  . Marital Status: Single    Spouse Name: N/A  . Number of Children: N/A  . Years of Education: N/A   Social History Main Topics  . Smoking status: Current Every Day Smoker -- 0.75 packs/day for 50 years    Types: Cigarettes  . Smokeless tobacco: Never Used     Comment: trying to cut back  . Alcohol Use: 1.2 oz/week    2 Glasses of wine per week     Comment: glass of wine everyday.  . Drug Use: No  . Sexual Activity: Not Asked     Comment: pt. given condoms   Other Topics Concern  . None   Social History Narrative   Review of Systems: Review of Systems  Constitutional: Negative for fever and chills.  HENT: Negative for ear pain.   Eyes: Negative for blurred vision and pain.  Respiratory: Negative for cough, shortness of breath and wheezing.   Cardiovascular: Negative for chest pain and leg swelling.  Gastrointestinal: Positive for heartburn and nausea. Negative for vomiting, abdominal pain, diarrhea, constipation, blood in stool and melena.  Genitourinary: Negative for dysuria, urgency and frequency.  Skin: Negative for itching and rash.  Neurological: Negative for dizziness, sensory change, focal weakness and headaches.    Objective:  Physical Exam: Filed Vitals:   08/07/15 0943  BP: 155/60  Pulse: 47  Temp: 97.9 F (36.6 C)  TempSrc: Oral  Height: 5' (1.524 m)  Weight: 129 lb 9.6 oz (58.786 kg)  SpO2: 98%   Physical Exam  Constitutional: She is oriented to person, place, and time. She appears well-developed and well-nourished. No distress.  HENT:  Head: Normocephalic and atraumatic.    Eyes: EOM are normal. Pupils are equal, round, and reactive to light.  Neck: Neck supple. No tracheal deviation present.  Cardiovascular: Normal rate, regular rhythm and intact distal pulses.  Exam reveals no gallop and no friction rub.   Murmur heard. Systolic ejection murmur at the R upper sternal border  Pulmonary/Chest: Effort normal. No respiratory distress. She has no wheezes. She has no rales.  Abdominal: Soft. Bowel sounds are normal. She exhibits no distension.  There is no tenderness. There is no rebound and no guarding.  Musculoskeletal: She exhibits no edema.  Neurological: She is alert and oriented to person, place, and time.  Skin: Skin is warm and dry.  3 raised 4x4 mm, non-tender, non-erythematous skin lesions on the R side of the chest.     Assessment & Plan:

## 2015-08-09 DIAGNOSIS — I10 Essential (primary) hypertension: Secondary | ICD-10-CM | POA: Diagnosis not present

## 2015-08-10 DIAGNOSIS — I10 Essential (primary) hypertension: Secondary | ICD-10-CM | POA: Diagnosis not present

## 2015-08-11 DIAGNOSIS — I10 Essential (primary) hypertension: Secondary | ICD-10-CM | POA: Diagnosis not present

## 2015-08-14 DIAGNOSIS — I10 Essential (primary) hypertension: Secondary | ICD-10-CM | POA: Diagnosis not present

## 2015-08-15 ENCOUNTER — Ambulatory Visit: Payer: Medicare Other | Admitting: Internal Medicine

## 2015-08-15 ENCOUNTER — Encounter: Payer: Self-pay | Admitting: Internal Medicine

## 2015-08-15 DIAGNOSIS — I10 Essential (primary) hypertension: Secondary | ICD-10-CM | POA: Diagnosis not present

## 2015-08-16 DIAGNOSIS — I10 Essential (primary) hypertension: Secondary | ICD-10-CM | POA: Diagnosis not present

## 2015-08-17 ENCOUNTER — Other Ambulatory Visit: Payer: Self-pay | Admitting: Internal Medicine

## 2015-08-17 DIAGNOSIS — I10 Essential (primary) hypertension: Secondary | ICD-10-CM | POA: Diagnosis not present

## 2015-08-18 DIAGNOSIS — I10 Essential (primary) hypertension: Secondary | ICD-10-CM | POA: Diagnosis not present

## 2015-08-21 DIAGNOSIS — I10 Essential (primary) hypertension: Secondary | ICD-10-CM | POA: Diagnosis not present

## 2015-08-22 DIAGNOSIS — I10 Essential (primary) hypertension: Secondary | ICD-10-CM | POA: Diagnosis not present

## 2015-08-23 DIAGNOSIS — I10 Essential (primary) hypertension: Secondary | ICD-10-CM | POA: Diagnosis not present

## 2015-08-24 DIAGNOSIS — I10 Essential (primary) hypertension: Secondary | ICD-10-CM | POA: Diagnosis not present

## 2015-08-25 DIAGNOSIS — I10 Essential (primary) hypertension: Secondary | ICD-10-CM | POA: Diagnosis not present

## 2015-08-28 DIAGNOSIS — I10 Essential (primary) hypertension: Secondary | ICD-10-CM | POA: Diagnosis not present

## 2015-08-29 DIAGNOSIS — I10 Essential (primary) hypertension: Secondary | ICD-10-CM | POA: Diagnosis not present

## 2015-08-30 ENCOUNTER — Other Ambulatory Visit: Payer: Medicare Other

## 2015-08-30 DIAGNOSIS — I10 Essential (primary) hypertension: Secondary | ICD-10-CM | POA: Diagnosis not present

## 2015-08-31 DIAGNOSIS — I10 Essential (primary) hypertension: Secondary | ICD-10-CM | POA: Diagnosis not present

## 2015-09-01 DIAGNOSIS — I10 Essential (primary) hypertension: Secondary | ICD-10-CM | POA: Diagnosis not present

## 2015-09-06 NOTE — Addendum Note (Signed)
Addended by: Hulan Fray on: 09/06/2015 06:16 PM   Modules accepted: Orders

## 2015-09-07 ENCOUNTER — Other Ambulatory Visit: Payer: Self-pay | Admitting: Infectious Diseases

## 2015-09-07 ENCOUNTER — Other Ambulatory Visit: Payer: Self-pay | Admitting: Internal Medicine

## 2015-09-12 ENCOUNTER — Other Ambulatory Visit: Payer: Self-pay | Admitting: Internal Medicine

## 2015-09-12 DIAGNOSIS — B20 Human immunodeficiency virus [HIV] disease: Secondary | ICD-10-CM

## 2015-09-12 DIAGNOSIS — I10 Essential (primary) hypertension: Secondary | ICD-10-CM

## 2015-09-12 NOTE — Telephone Encounter (Signed)
Pt called requesting ferrous sulfate, chlorthalidone, Tivicay, isosorbide mononitrate, lisinopril, pantoprazole, and amlodipine to be filled @ [facility] on [address]. Pt want new Rx for Ensure.

## 2015-09-13 ENCOUNTER — Ambulatory Visit: Payer: Medicare Other | Admitting: Infectious Diseases

## 2015-09-14 MED ORDER — FERROUS SULFATE 325 (65 FE) MG PO TABS: 325.0000 mg | ORAL_TABLET | Freq: Three times a day (TID) | ORAL | Status: DC

## 2015-09-14 MED ORDER — DOLUTEGRAVIR SODIUM 50 MG PO TABS: 50.0000 mg | ORAL_TABLET | Freq: Every day | ORAL | Status: DC

## 2015-09-14 MED ORDER — PANTOPRAZOLE SODIUM 40 MG PO TBEC: 40.0000 mg | DELAYED_RELEASE_TABLET | Freq: Two times a day (BID) | ORAL | Status: DC

## 2015-09-14 MED ORDER — LISINOPRIL 40 MG PO TABS: 40.0000 mg | ORAL_TABLET | Freq: Every day | ORAL | Status: DC

## 2015-09-14 MED ORDER — AMLODIPINE BESYLATE 10 MG PO TABS: 10.0000 mg | ORAL_TABLET | Freq: Every day | ORAL | Status: DC

## 2015-09-14 MED ORDER — CHLORTHALIDONE 25 MG PO TABS: 25.0000 mg | ORAL_TABLET | Freq: Every day | ORAL | Status: DC

## 2015-09-14 MED ORDER — ISOSORBIDE MONONITRATE ER 30 MG PO TB24: 30.0000 mg | ORAL_TABLET | Freq: Every day | ORAL | Status: DC

## 2015-09-15 ENCOUNTER — Emergency Department (HOSPITAL_COMMUNITY)
Admission: EM | Admit: 2015-09-15 | Discharge: 2015-09-15 | Disposition: A | Discharge status: Home / Self Care | Payer: Medicare Other | Attending: Emergency Medicine | Admitting: Emergency Medicine

## 2015-09-15 ENCOUNTER — Encounter (HOSPITAL_COMMUNITY): Payer: Self-pay | Admitting: Emergency Medicine

## 2015-09-15 DIAGNOSIS — Z720 Tobacco use: Secondary | ICD-10-CM | POA: Diagnosis not present

## 2015-09-15 DIAGNOSIS — I10 Essential (primary) hypertension: Secondary | ICD-10-CM | POA: Diagnosis not present

## 2015-09-15 DIAGNOSIS — Z79899 Other long term (current) drug therapy: Secondary | ICD-10-CM | POA: Insufficient documentation

## 2015-09-15 DIAGNOSIS — D509 Iron deficiency anemia, unspecified: Secondary | ICD-10-CM | POA: Insufficient documentation

## 2015-09-15 DIAGNOSIS — J45909 Unspecified asthma, uncomplicated: Secondary | ICD-10-CM | POA: Diagnosis not present

## 2015-09-15 DIAGNOSIS — Z21 Asymptomatic human immunodeficiency virus [HIV] infection status: Secondary | ICD-10-CM | POA: Insufficient documentation

## 2015-09-15 DIAGNOSIS — I509 Heart failure, unspecified: Secondary | ICD-10-CM | POA: Insufficient documentation

## 2015-09-15 DIAGNOSIS — M545 Low back pain, unspecified: Secondary | ICD-10-CM

## 2015-09-15 DIAGNOSIS — Z8619 Personal history of other infectious and parasitic diseases: Secondary | ICD-10-CM | POA: Diagnosis not present

## 2015-09-15 DIAGNOSIS — M1990 Unspecified osteoarthritis, unspecified site: Secondary | ICD-10-CM | POA: Diagnosis not present

## 2015-09-15 DIAGNOSIS — Z7951 Long term (current) use of inhaled steroids: Secondary | ICD-10-CM | POA: Insufficient documentation

## 2015-09-15 DIAGNOSIS — I2510 Atherosclerotic heart disease of native coronary artery without angina pectoris: Secondary | ICD-10-CM | POA: Insufficient documentation

## 2015-09-15 DIAGNOSIS — R011 Cardiac murmur, unspecified: Secondary | ICD-10-CM | POA: Diagnosis not present

## 2015-09-15 DIAGNOSIS — Z0289 Encounter for other administrative examinations: Secondary | ICD-10-CM | POA: Diagnosis present

## 2015-09-15 DIAGNOSIS — K219 Gastro-esophageal reflux disease without esophagitis: Secondary | ICD-10-CM | POA: Insufficient documentation

## 2015-09-15 NOTE — ED Provider Notes (Signed)
CSN: [id number]     Arrival date & time 09/15/15  0821 History  By signing my name below, I, Starleen Arms, attest that this documentation has been prepared under the direction and in the presence of Delsa Grana, PA-C. Electronically Signed: Starleen Arms ED Scribe. 09/15/2015. 9:19 AM.    Chief Complaint  Patient presents with  . Letter for School/Work   The history is provided by the patient. No language interpreter was used.    HPI Comments: Nicole Mccormick is a 69 y.o. female who presents to the Emergency Department requesting a letter for work.  She reports she called out from work last night due to currently resolved lower back pain that she treated with OTC Doan pills.  She requests the note state that she is fit to return to work this evening.  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  She denies hx of DDD, hx of IVDU, spinal surgery.  She denies injury, extremity numbness/weakness, saddle anesthesia, bowel/bladder incontinence, fever.   Past Medical History  Diagnosis Date  . Hypertension   . Abnormal Pap smear   . Asthma   . Coronary artery disease   . HIV infection ([place])     diagnosed before 2008  . Varicosities   . Seizures ([place])     last sz fri nov 2  . GERD (gastroesophageal reflux disease)     previously on aciphex, discontinued november 2012  because patient asymptomatic, and concern about interference with HIV meds.  May try pepcid in the future if symptoms return  . Iron deficiency anemia     Ferritin = 2 in november 2012, started on iron supplemenation  . Arthritis   . CHF (congestive heart failure) ([place])   . Heart murmur   . Hepatitis C antibody test positive    Past Surgical History  Procedure Laterality Date  . Abdominal hysterectomy    . Esophagogastroduodenoscopy  10/13/11    small hiatal hernia  . Colonoscopy  10/13/11    small adenoma, anal condyloma  . Colonoscopy  10/13/2011    Procedure: COLONOSCOPY;   Surgeon: Gatha Mayer, MD;  Location: [facility];  Service: Endoscopy;  Laterality: N/A;  . Esophagogastroduodenoscopy  10/13/2011    Procedure: ESOPHAGOGASTRODUODENOSCOPY (EGD);  Surgeon: Gatha Mayer, MD;  Location: [facility] ENDOSCOPY;  Service: Endoscopy;  Laterality: N/A;  . Esophagogastroduodenoscopy N/A 09/14/2014    Procedure: ESOPHAGOGASTRODUODENOSCOPY (EGD);  Surgeon: Arta Silence, MD;  [facility];  Service: Endoscopy;  Laterality: N/A;  . Colonoscopy N/A 09/14/2014    Procedure: COLONOSCOPY;  Surgeon: Arta Silence, MD;  Location: [facility] ENDOSCOPY;  Service: Endoscopy;  Laterality: N/A;  . Givens capsule study N/A 09/14/2014    Procedure: GIVENS CAPSULE STUDY;  Surgeon: Arta Silence, MD;  Location: [facility] ENDOSCOPY;  Service: Endoscopy;  Laterality: N/A;   Family History  Problem Relation Age of Onset  . Diabetes Mother   . Ovarian cancer Sister   . Cancer Sister     ovarian and colon  . Colon cancer Neg Hx    Social History  Substance Use Topics  . Smoking status: Current Every Day Smoker -- 0.75 packs/day for 50 years    Types: Cigarettes  . Smokeless tobacco: Never Used     Comment: trying to cut back  . Alcohol Use: 1.2 oz/week    2 Glasses of wine per week     Comment: glass of wine everyday.   OB History  Gravida Para Term Preterm AB TAB SAB Ectopic Multiple Living   6 5   1  1   5      Review of Systems  Constitutional: Negative.  Negative for fever.  Genitourinary: Negative for dysuria, hematuria and flank pain.  Musculoskeletal: Positive for back pain.  Neurological: Negative for weakness and numbness.      Allergies  Penicillins and Avelox  Home Medications   Prior to Admission medications   Medication Sig Start Date End Date Taking? Authorizing Provider  acyclovir (ZOVIRAX) 400 MG tablet Take 1 tablet (400 mg total) by mouth daily as needed (for flare ups). 09/16/14   Bartholomew Crews, MD  albuterol (PROVENTIL HFA;VENTOLIN HFA) 108 (90  BASE) MCG/ACT inhaler Inhale 2 puffs into the lungs every 6 (six) hours as needed for wheezing or shortness of breath. 07/21/15   Shela Leff, MD  albuterol (PROVENTIL) (2.5 MG/3ML) 0.083% nebulizer solution Take 3 mLs (2.5 mg total) by nebulization every 6 (six) hours as needed for wheezing or shortness of breath. 07/21/15   Shela Leff, MD  amLODipine (NORVASC) 10 MG tablet Take 1 tablet (10 mg total) by mouth daily. 09/14/15   Campbell Riches, MD  baclofen (LIORESAL) 10 MG tablet Take 10 mg by mouth 3 (three) times daily as needed for muscle spasms.    Historical Provider, MD  chlorthalidone (HYGROTON) 25 MG tablet Take 1 tablet (25 mg total) by mouth daily. 09/14/15   Campbell Riches, MD  diazepam (VALIUM) 2 MG tablet take 1 tablet by mouth every 6 hours if needed for anxiety 07/21/15   Shela Leff, MD  dolutegravir (TIVICAY) 50 MG tablet Take 1 tablet (50 mg total) by mouth daily. 09/14/15   Campbell Riches, MD  emtricitabine-tenofovir (TRUVADA) 200-300 MG per tablet Take 1 tablet by mouth daily. 05/22/15   Carlyle Basques, MD  ferrous sulfate 325 (65 FE) MG tablet Take 1 tablet (325 mg total) by mouth 3 (three) times daily with meals. 09/14/15   Campbell Riches, MD  gabapentin (NEURONTIN) 600 MG tablet Take 600 mg by mouth 2 (two) times daily.    Historical Provider, MD  isosorbide mononitrate (IMDUR) 30 MG 24 hr tablet Take 1 tablet (30 mg total) by mouth daily. 09/14/15   Campbell Riches, MD  lisinopril (PRINIVIL,ZESTRIL) 40 MG tablet Take 1 tablet (40 mg total) by mouth daily. 09/14/15   Campbell Riches, MD  loratadine (CLARITIN) 10 MG tablet Take 1 tablet (10 mg total) by mouth daily as needed for allergies. 07/21/15   Shela Leff, MD  mometasone (ASMANEX) 220 MCG/INH inhaler Inhale 1 puff into the lungs 2 (two) times daily. 07/21/15   Shela Leff, MD  Nutritional Supplements (ENSURE NUTRA SHAKE HI-CAL) LIQD Take 1 Can by mouth 2 (two) times daily. 03/13/15    Campbell Riches, MD  ondansetron (ZOFRAN ODT) 4 MG disintegrating tablet 4mg  ODT q4 hours prn nausea/vomit 09/19/14   Pamella Pert, MD  pantoprazole (PROTONIX) 40 MG tablet Take 1 tablet (40 mg total) by mouth 2 (two) times daily. 09/14/15   Campbell Riches, MD  pneumococcal 13-valent conjugate vaccine (PREVNAR 13) SUSP injection Inject 0.5 mLs into the muscle tomorrow at 10 am. 07/21/15   Shela Leff, MD  sucralfate (CARAFATE) 1 G tablet Take 1 tablet (1 g total) by mouth 4 (four) times daily. 09/08/14   Oval Linsey, MD  ZOSTAVAX 83382 UNT/0.65ML injection Inject 19,400 Units into the skin once. 07/21/15   Shela Leff, MD  BP 172/62 mmHg  Pulse 47  Temp(Src) 98.4 F (36.9 C) (Oral)  Resp 18  Ht 5\' 2"  (1.575 m)  Wt 134 lb (60.782 kg)  BMI 24.50 kg/m2  SpO2 100% Physical Exam  Constitutional: She is oriented to person, place, and time. She appears well-developed and well-nourished. No distress.  HENT:  Head: Normocephalic and atraumatic.  Right Ear: External ear normal.  Left Ear: External ear normal.  Nose: Nose normal.  Mouth/Throat: Oropharynx is clear and moist. No oropharyngeal exudate.  Eyes: Conjunctivae and EOM are normal. Pupils are equal, round, and reactive to light. Right eye exhibits no discharge. Left eye exhibits no discharge. No scleral icterus.  Neck: Normal range of motion. Neck supple. No JVD present. No tracheal deviation present.  Cardiovascular: Normal rate and regular rhythm.   Pulmonary/Chest: Effort normal and breath sounds normal. No stridor. No respiratory distress.  Musculoskeletal: Normal range of motion. She exhibits no edema or tenderness.  NVI.  Normal strength.  5/5 for hip flexion.  Normal ROM of all back.  No spinal tenderness, paraspinal tenderness, or muscle spasm.    Lymphadenopathy:    She has no cervical adenopathy.  Neurological: She is alert and oriented to person, place, and time. She exhibits normal muscle tone.  Coordination normal.  Skin: Skin is warm and dry. No rash noted. She is not diaphoretic. No erythema. No pallor.  Psychiatric: She has a normal mood and affect. Her behavior is normal. Judgment and thought content normal.  Nursing note and vitals reviewed.   ED Course  Procedures (including critical care time)  DIAGNOSTIC STUDIES: Oxygen Saturation is 100% on RA, normal by my interpretation.    COORDINATION OF CARE:  9:18 AM Will provide work note.  Patient acknowledges and agrees with plan.    Labs Review Labs Reviewed - No data to display  Imaging Review No results found.   EKG Interpretation None      MDM   Final diagnoses:  None   Pt with back pain last night, no pain this morning, presents for a note to allow her to return to work. No neurological deficits and normal neuro exam.  Normal gait, ROM of back and all extremities, normal strength.  No loss of bowel or bladder control.  No concern for cauda equina.  No fever, night sweats, weight loss, h/o cancer, IVDU.  RICE protocol and pain medicine indicated and discussed with patient.   Pt seen by Dr. Canary Brim. Pt stable to d/c home.  Pt given note allowing return to work without any restrictions. Filed Vitals:   09/15/15 0827 09/15/15 0832 09/15/15 0922  BP: 172/62  167/63  Pulse: 47  45  Temp: 98.4 F (36.9 C)  97.7 F (36.5 C)  TempSrc: Oral  Oral  Resp: 18  18  Height:  5\' 2"  (1.575 m)   Weight:  134 lb (60.782 kg)   SpO2: 100%  99%     I personally performed the services described in this documentation, which was scribed in my presence. The recorded information has been reviewed and is accurate.    Delsa Grana, PA-C 09/18/15 0900  Alfonzo Beers, MD 09/19/15 [phone]

## 2015-09-15 NOTE — ED Notes (Signed)
Pt reports she just needs a work note so she can go back to work. Pt reports low back pain that is relieved by over the counter medications.

## 2015-09-15 NOTE — Discharge Instructions (Signed)
Back Injury Prevention Back injuries can be very painful. They can also be difficult to heal. After having one back injury, you are more likely to injure your back again. It is important to learn how to avoid injuring or re-injuring your back. The following tips can help you to prevent a back injury. WHAT SHOULD I KNOW ABOUT PHYSICAL FITNESS?  Exercise for 30 minutes per day on most days of the week or as directed by your health care provider. Make sure to:  Do aerobic exercises, such as walking, jogging, biking, or swimming.  Do exercises that increase balance and strength, such as tai chi and yoga. These can decrease your risk of falling and injuring your back.  Do stretching exercises to help with flexibility.  Try to develop strong abdominal muscles. Your abdominal muscles provide a lot of the support that is needed by your back.  Maintain a healthy weight. This helps to decrease your risk of a back injury. WHAT SHOULD I KNOW ABOUT MY DIET?  Talk with your health care provider about your overall diet. Take supplements and vitamins only as directed by your health care provider.  Talk with your health care provider about how much calcium and vitamin D you need each day. These nutrients help to prevent weakening of the bones (osteoporosis). Osteoporosis can cause broken (fractured) bones, which lead to back pain.  Include good sources of calcium in your diet, such as dairy products, green leafy vegetables, and products that have had calcium added to them (fortified).  Include good sources of vitamin D in your diet, such as milk and foods that are fortified with vitamin D. WHAT SHOULD I KNOW ABOUT MY POSTURE?  Sit up straight and stand up straight. Avoid leaning forward when you sit or hunching over when you stand.  Choose chairs that have good low-back (lumbar) support.  If you work at a desk, sit close to it so you do not need to lean over. Keep your chin tucked in. Keep your neck  drawn back, and keep your elbows bent at a right angle. Your arms should look like the letter "L."  Sit high and close to the steering wheel when you drive. Add a lumbar support to your car seat, if needed.  Avoid sitting or standing in one position for very long. Take breaks to get up, stretch, and walk around at least one time every hour. Take breaks every hour if you are driving for long periods of time.  Sleep on your side with your knees slightly bent, or sleep on your back with a pillow under your knees. Do not lie on the front of your body to sleep. WHAT SHOULD I KNOW ABOUT LIFTING, TWISTING, AND REACHING? Lifting and Heavy Lifting  Avoid heavy lifting, especially repetitive heavy lifting. If you must do heavy lifting:  Stretch before lifting.  Work slowly.  Rest between lifts.  Use a tool such as a cart or a dolly to move objects if one is available.  Make several small trips instead of carrying one heavy load.  Ask for help when you need it, especially when moving big objects.  Follow these steps when lifting:  Stand with your feet shoulder-width apart.  Get as close to the object as you can. Do not try to pick up a heavy object that is far from your body.  Use handles or lifting straps if they are available.  Bend at your knees. Squat down, but keep your heels off the floor.  Keep your shoulders pulled back, your chin tucked in, and your back straight. °¨ Lift the object slowly while you tighten the muscles in your legs, abdomen, and buttocks. Keep the object as close to the center of your body as possible. °· Follow these steps when putting down a heavy load: °¨ Stand with your feet shoulder-width apart. °¨ Lower the object slowly while you tighten the muscles in your legs, abdomen, and buttocks. Keep the object as close to the center of your body as possible. °¨ Keep your shoulders pulled back, your chin tucked in, and your back straight. °¨ Bend at your knees. Squat  down, but keep your heels off the floor. °¨ Use handles or lifting straps if they are available. °Twisting and Reaching °· Avoid lifting heavy objects above your waist. °· Do not twist at your waist while you are lifting or carrying a load. If you need to turn, move your feet. °· Do not bend over without bending at your knees. °· Avoid reaching over your head, across a table, or for an object on a high surface. °WHAT ARE SOME OTHER TIPS? °· Avoid wet floors and icy ground. Keep sidewalks clear of ice to prevent falls. °· Do not sleep on a mattress that is too soft or too hard. °· Keep items that are used frequently within easy reach. °· Put heavier objects on shelves at waist level, and put lighter objects on lower or higher shelves. °· Find ways to decrease your stress, such as exercise, massage, or relaxation techniques. Stress can build up in your muscles. Tense muscles are more vulnerable to injury. °· Talk with your health care provider if you feel anxious or depressed. These conditions can make back pain worse. °· Wear flat heel shoes with cushioned soles. °· Avoid sudden movements. °· Use both shoulder straps when carrying a backpack. °· Do not use any tobacco products, including cigarettes, chewing tobacco, or electronic cigarettes. If you need help quitting, ask your health care provider. °  °This information is not intended to replace advice given to you by your health care provider. Make sure you discuss any questions you have with your health care provider. °  °Document Released: 01/02/2005 Document Revised: 04/11/2015 Document Reviewed: 11/29/2014 °Elsevier Interactive Patient Education ©2016 [employer] ° °Back Pain, Adult °Back pain is very common. The pain often gets better over time. The cause of back pain is usually not dangerous. Most people can learn to manage their back pain on their own.  °HOME CARE  °Watch your back pain for any changes. The following actions may help to lessen any pain you  are feeling: °· Stay active. Start with short walks on flat ground if you can. Try to walk farther each day. °· Exercise regularly as told by your doctor. Exercise helps your back heal faster. It also helps avoid future injury by keeping your muscles strong and flexible. °· Do not sit, drive, or stand in one place for more than 30 minutes. °· Do not stay in bed. Resting more than 1-2 days can slow down your recovery. °· Be careful when you bend or lift an object. Use good form when lifting: °¨ Bend at your knees. °¨ Keep the object close to your body. °¨ Do not twist. °· Sleep on a firm mattress. Lie on your side, and bend your knees. If you lie on your back, put a pillow under your knees. °· Take medicines only as told by your doctor. °· Put ice   on the injured area. °¨ Put ice in a plastic bag. °¨ Place a towel between your skin and the bag. °¨ Leave the ice on for 20 minutes, 2-3 times a day for the first 2-3 days. After that, you can switch between ice and heat packs. °· Avoid feeling anxious or stressed. Find good ways to deal with stress, such as exercise. °· Maintain a healthy weight. Extra weight puts stress on your back. °GET HELP IF:  °· You have pain that does not go away with rest or medicine. °· You have worsening pain that goes down into your legs or buttocks. °· You have pain that does not get better in one week. °· You have pain at night. °· You lose weight. °· You have a fever or chills. °GET HELP RIGHT AWAY IF:  °· You cannot control when you poop (bowel movement) or pee (urinate). °· Your arms or legs feel weak. °· Your arms or legs lose feeling (numbness). °· You feel sick to your stomach (nauseous) or throw up (vomit). °· You have belly (abdominal) pain. °· You feel like you may pass out (faint). °  °This information is not intended to replace advice given to you by your health care provider. Make sure you discuss any questions you have with your health care provider. °  °Document Released:  05/13/2008 Document Revised: 12/16/2014 Document Reviewed: 03/29/2014 °Elsevier Interactive Patient Education ©2016 [employer]vier Inc. ° °

## 2015-09-15 NOTE — ED Notes (Signed)
Pt reports that she was not seen by a MD for back pain but needs a work note to state she can  return to work . The back pain started on Wed. Night.

## 2015-10-04 ENCOUNTER — Ambulatory Visit: Payer: Medicare Other | Admitting: Infectious Diseases

## 2015-10-30 ENCOUNTER — Encounter: Payer: Self-pay | Admitting: Infectious Diseases

## 2015-10-30 ENCOUNTER — Ambulatory Visit (INDEPENDENT_AMBULATORY_CARE_PROVIDER_SITE_OTHER): Payer: Medicare Other | Admitting: Infectious Diseases

## 2015-10-30 VITALS — BP 122/66 | HR 51 | Temp 98.2°F | Ht 62.0 in | Wt 134.1 lb

## 2015-10-30 DIAGNOSIS — B20 Human immunodeficiency virus [HIV] disease: Secondary | ICD-10-CM | POA: Diagnosis not present

## 2015-10-30 DIAGNOSIS — J209 Acute bronchitis, unspecified: Secondary | ICD-10-CM

## 2015-10-30 DIAGNOSIS — B182 Chronic viral hepatitis C: Secondary | ICD-10-CM | POA: Diagnosis not present

## 2015-10-30 DIAGNOSIS — F17200 Nicotine dependence, unspecified, uncomplicated: Secondary | ICD-10-CM

## 2015-10-30 LAB — COMPREHENSIVE METABOLIC PANEL
ALT: 9 U/L (ref 6–29)
AST: 16 U/L (ref 10–35)
Albumin: 3.6 g/dL (ref 3.6–5.1)
Alkaline Phosphatase: 75 U/L (ref 33–130)
BUN: 27 mg/dL — ABNORMAL HIGH (ref 7–25)
CO2: 29 mmol/L (ref 20–31)
Calcium: 8.8 mg/dL (ref 8.6–10.4)
Chloride: 103 mmol/L (ref 98–110)
Creat: 0.88 mg/dL (ref 0.60–0.93)
Glucose, Bld: 99 mg/dL (ref 65–99)
Potassium: 3.8 mmol/L (ref 3.5–5.3)
Sodium: 139 mmol/L (ref 135–146)
Total Bilirubin: 0.2 mg/dL (ref 0.2–1.2)
Total Protein: 7.1 g/dL (ref 6.1–8.1)

## 2015-10-30 LAB — CBC
HCT: 29.2 % — ABNORMAL LOW (ref 36.0–46.0)
Hemoglobin: 9.1 g/dL — ABNORMAL LOW (ref 12.0–15.0)
MCH: 29 pg (ref 26.0–34.0)
MCHC: 31.2 g/dL (ref 30.0–36.0)
MCV: 93 fL (ref 78.0–100.0)
MPV: 9.3 fL (ref 8.6–12.4)
Platelets: 298 10*3/uL (ref 150–400)
RBC: 3.14 MIL/uL — ABNORMAL LOW (ref 3.87–5.11)
RDW: 15.5 % (ref 11.5–15.5)
WBC: 5.7 10*3/uL (ref 4.0–10.5)

## 2015-10-30 MED ORDER — EMTRICITABINE-TENOFOVIR AF 200-25 MG PO TABS: 1.0000 | ORAL_TABLET | Freq: Every day | ORAL | Status: DC

## 2015-10-30 MED ORDER — DOLUTEGRAVIR SODIUM 50 MG PO TABS: 50.0000 mg | ORAL_TABLET | Freq: Every day | ORAL | Status: DC

## 2015-10-30 MED ORDER — AZITHROMYCIN 250 MG PO TABS: ORAL_TABLET | ORAL | Status: DC

## 2015-10-30 NOTE — Assessment & Plan Note (Signed)
Encouraged to qiut

## 2015-10-30 NOTE — Addendum Note (Signed)
Addended by: Shawntay Prest C on: 10/30/2015 03:45 PM   Modules accepted: Orders

## 2015-10-30 NOTE — Progress Notes (Signed)
   Subjective:    Patient ID: Nicole Mccormick, female    DOB: 25-Feb-1946, 69 y.o.   MRN: [id number]  HPI 69 yo F with hx of HIV+, Hep C, adm to hospital 10- to 10-8 with HgB of 5.7, She underwent EGD and colonoscopy which did not show active bleeding. She had capsule endoscopy which suggested small AVMs. She was started on protonix and due to this her ART was changed to prezcobix (off the ATVr).  She had difficulty with med adherence and was change to DTGV/TRV 10-2014. Has had no problems with this.  She has completed her course of harvoni (02-2015).  Today complains of nausea, SOB/DOE, f/c, fatigue for last 3 days. Breathing through her mouth.  Denies recent travel or prolonged immobilization.  Denies sick exposures.  Denies missed ART.  Mild diarrhea, no dysuria/cloudiness/hematuria/odor.   HIV 1 RNA QUANT (copies/mL)  Date Value  01/26/2015 <20  09/12/2014 <20  03/01/2014 <20   CD4 T CELL ABS (/uL)  Date Value  01/26/2015 520  09/12/2014 620  08/07/2014 510     Review of Systems  Constitutional: Positive for appetite change. Negative for unexpected weight change.  Respiratory: Positive for cough and shortness of breath.   Gastrointestinal: Positive for diarrhea. Negative for constipation.  Genitourinary: Negative for dysuria, hematuria and difficulty urinating.  Please see HPI. 12 point ROS o/w (-)      Objective:   Physical Exam  Constitutional: She appears well-developed. She appears distressed.  HENT:  Mouth/Throat: No oropharyngeal exudate.  Eyes: EOM are normal. Pupils are equal, round, and reactive to light.  Neck: Neck supple.  Cardiovascular: Normal rate, regular rhythm and normal heart sounds.   Pulmonary/Chest:  She has mild dyspnea at rest. Is mouth breathing.  Sinus congestion.   Abdominal: Soft. Bowel sounds are normal. There is no tenderness. There is no rebound.  Musculoskeletal: She exhibits no edema.  Lymphadenopathy:    She has no cervical  adenopathy.       Assessment & Plan:

## 2015-10-30 NOTE — Assessment & Plan Note (Signed)
I offered to send her to ED for eval and she refuses.  Will give her z-pack Asked her to call back in 2 days if not better.

## 2015-10-30 NOTE — Assessment & Plan Note (Signed)
Will recheck her labs when she allows.  

## 2015-10-30 NOTE — Assessment & Plan Note (Addendum)
Update her meds to descovy/dtgv.  Has gotten flu shot.  Need to get her on pap/mammo/colon list Will see back in 2-3 weeks.

## 2015-10-31 ENCOUNTER — Telehealth: Payer: Self-pay | Admitting: *Deleted

## 2015-10-31 ENCOUNTER — Encounter: Payer: Self-pay | Admitting: Student

## 2015-10-31 LAB — T-HELPER CELL (CD4) - (RCID CLINIC ONLY)
CD4 % Helper T Cell: 30 % — ABNORMAL LOW (ref 33–55)
CD4 T Cell Abs: 550 /uL (ref 400–2700)

## 2015-10-31 NOTE — Telephone Encounter (Signed)
Z-Pack working wonders, feels better today.  Wanted Dr. Johnnye Sima to know.

## 2015-10-31 NOTE — Telephone Encounter (Signed)
-----   Message from Campbell Riches, MD sent at 10/31/2015  4:44 PM EST ----- Needs repeat CBC  ----- Message -----    From: Lab in [facility]    Sent: 10/30/2015   9:14 PM      To: Campbell Riches, MD

## 2015-10-31 NOTE — Telephone Encounter (Signed)
Patient will come Tuesday 11/29 for repeat CBC.  RN reconnected her to Dr. [facility], for follow up per Dr. Johnnye Sima.  They will contact her for an appointment after discussing the drop in H/H with Dr. Paulita Fujita. Landis Gandy, RN

## 2015-11-01 ENCOUNTER — Telehealth: Payer: Self-pay | Admitting: *Deleted

## 2015-11-01 LAB — HEPATITIS C RNA QUANTITATIVE: HCV Quantitative: NOT DETECTED IU/mL (ref <15)

## 2015-11-01 NOTE — Telephone Encounter (Signed)
Patient called stating she needed a note to return to work. She was previously on an antibiotic from Dr. Johnnye Sima. Generic work note provided. Nicole Mccormick

## 2015-11-06 ENCOUNTER — Telehealth: Payer: Self-pay | Admitting: *Deleted

## 2015-11-06 NOTE — Telephone Encounter (Signed)
Pt thinks that she is having gallbladder problems; nausea, acid taste in her mouth, fatigue and lightheadedness.  The patient is really feeling terrible.  Requesting the appointment with Dr. Stac[facility] to be made ASAP.

## 2015-11-06 NOTE — Telephone Encounter (Signed)
Patient should contact [facility] for this.  She is a current patient of Dr. Paulita Fujita.

## 2015-11-07 ENCOUNTER — Other Ambulatory Visit: Payer: Self-pay | Admitting: Infectious Diseases

## 2015-11-07 ENCOUNTER — Other Ambulatory Visit: Payer: Medicare Other

## 2015-11-07 ENCOUNTER — Observation Stay (HOSPITAL_COMMUNITY)
Admission: EM | Admit: 2015-11-07 | Discharge: 2015-11-10 | Disposition: A | Discharge status: Home / Self Care | Payer: Medicare Other | Attending: Oncology | Admitting: Oncology

## 2015-11-07 ENCOUNTER — Telehealth: Payer: Self-pay | Admitting: *Deleted

## 2015-11-07 ENCOUNTER — Encounter (HOSPITAL_COMMUNITY): Payer: Self-pay | Admitting: Emergency Medicine

## 2015-11-07 ENCOUNTER — Emergency Department (HOSPITAL_COMMUNITY): Payer: Medicare Other

## 2015-11-07 DIAGNOSIS — I459 Conduction disorder, unspecified: Secondary | ICD-10-CM | POA: Insufficient documentation

## 2015-11-07 DIAGNOSIS — I2510 Atherosclerotic heart disease of native coronary artery without angina pectoris: Secondary | ICD-10-CM | POA: Insufficient documentation

## 2015-11-07 DIAGNOSIS — Z79899 Other long term (current) drug therapy: Secondary | ICD-10-CM | POA: Diagnosis not present

## 2015-11-07 DIAGNOSIS — M549 Dorsalgia, unspecified: Secondary | ICD-10-CM | POA: Diagnosis not present

## 2015-11-07 DIAGNOSIS — J45909 Unspecified asthma, uncomplicated: Secondary | ICD-10-CM | POA: Diagnosis present

## 2015-11-07 DIAGNOSIS — D500 Iron deficiency anemia secondary to blood loss (chronic): Principal | ICD-10-CM | POA: Insufficient documentation

## 2015-11-07 DIAGNOSIS — K219 Gastro-esophageal reflux disease without esophagitis: Secondary | ICD-10-CM | POA: Diagnosis present

## 2015-11-07 DIAGNOSIS — F419 Anxiety disorder, unspecified: Secondary | ICD-10-CM | POA: Diagnosis present

## 2015-11-07 DIAGNOSIS — I110 Hypertensive heart disease with heart failure: Secondary | ICD-10-CM | POA: Insufficient documentation

## 2015-11-07 DIAGNOSIS — M1990 Unspecified osteoarthritis, unspecified site: Secondary | ICD-10-CM | POA: Insufficient documentation

## 2015-11-07 DIAGNOSIS — B20 Human immunodeficiency virus [HIV] disease: Secondary | ICD-10-CM

## 2015-11-07 DIAGNOSIS — G8929 Other chronic pain: Secondary | ICD-10-CM | POA: Diagnosis not present

## 2015-11-07 DIAGNOSIS — K625 Hemorrhage of anus and rectum: Secondary | ICD-10-CM | POA: Diagnosis not present

## 2015-11-07 DIAGNOSIS — B1920 Unspecified viral hepatitis C without hepatic coma: Secondary | ICD-10-CM | POA: Diagnosis present

## 2015-11-07 DIAGNOSIS — R110 Nausea: Secondary | ICD-10-CM

## 2015-11-07 DIAGNOSIS — I252 Old myocardial infarction: Secondary | ICD-10-CM | POA: Diagnosis not present

## 2015-11-07 DIAGNOSIS — D509 Iron deficiency anemia, unspecified: Secondary | ICD-10-CM | POA: Diagnosis present

## 2015-11-07 DIAGNOSIS — R531 Weakness: Secondary | ICD-10-CM | POA: Diagnosis not present

## 2015-11-07 DIAGNOSIS — I5032 Chronic diastolic (congestive) heart failure: Secondary | ICD-10-CM | POA: Diagnosis not present

## 2015-11-07 DIAGNOSIS — K922 Gastrointestinal hemorrhage, unspecified: Secondary | ICD-10-CM | POA: Diagnosis present

## 2015-11-07 DIAGNOSIS — F17210 Nicotine dependence, cigarettes, uncomplicated: Secondary | ICD-10-CM | POA: Diagnosis not present

## 2015-11-07 DIAGNOSIS — R001 Bradycardia, unspecified: Secondary | ICD-10-CM | POA: Insufficient documentation

## 2015-11-07 DIAGNOSIS — A630 Anogenital (venereal) warts: Secondary | ICD-10-CM | POA: Diagnosis present

## 2015-11-07 DIAGNOSIS — B182 Chronic viral hepatitis C: Secondary | ICD-10-CM

## 2015-11-07 DIAGNOSIS — I10 Essential (primary) hypertension: Secondary | ICD-10-CM | POA: Diagnosis present

## 2015-11-07 DIAGNOSIS — J449 Chronic obstructive pulmonary disease, unspecified: Secondary | ICD-10-CM | POA: Insufficient documentation

## 2015-11-07 DIAGNOSIS — R42 Dizziness and giddiness: Secondary | ICD-10-CM | POA: Diagnosis not present

## 2015-11-07 DIAGNOSIS — R079 Chest pain, unspecified: Secondary | ICD-10-CM | POA: Insufficient documentation

## 2015-11-07 DIAGNOSIS — F17200 Nicotine dependence, unspecified, uncomplicated: Secondary | ICD-10-CM | POA: Diagnosis present

## 2015-11-07 HISTORY — DX: Adverse effect of unspecified anesthetic, initial encounter: T41.45XA

## 2015-11-07 HISTORY — DX: Other complications of anesthesia, initial encounter: T88.59XA

## 2015-11-07 LAB — CBC WITH DIFFERENTIAL/PLATELET
Basophils Absolute: 0 10*3/uL (ref 0.0–0.1)
Basophils Relative: 0 %
Eosinophils Absolute: 0.1 10*3/uL (ref 0.0–0.7)
Eosinophils Relative: 2 %
HCT: 25.1 % — ABNORMAL LOW (ref 36.0–46.0)
Hemoglobin: 7.5 g/dL — ABNORMAL LOW (ref 12.0–15.0)
Lymphocytes Relative: 31 %
Lymphs Abs: 1.6 10*3/uL (ref 0.7–4.0)
MCH: 27 pg (ref 26.0–34.0)
MCHC: 29.9 g/dL — ABNORMAL LOW (ref 30.0–36.0)
MCV: 90.3 fL (ref 78.0–100.0)
Monocytes Absolute: 0.3 10*3/uL (ref 0.1–1.0)
Monocytes Relative: 6 %
Neutro Abs: 3 10*3/uL (ref 1.7–7.7)
Neutrophils Relative %: 61 %
Platelets: 270 10*3/uL (ref 150–400)
RBC: 2.78 MIL/uL — ABNORMAL LOW (ref 3.87–5.11)
RDW: 16.2 % — ABNORMAL HIGH (ref 11.5–15.5)
WBC: 5 10*3/uL (ref 4.0–10.5)

## 2015-11-07 LAB — COMPREHENSIVE METABOLIC PANEL
ALT: 11 U/L — ABNORMAL LOW (ref 14–54)
AST: 19 U/L (ref 15–41)
Albumin: 3.4 g/dL — ABNORMAL LOW (ref 3.5–5.0)
Alkaline Phosphatase: 74 U/L (ref 38–126)
Anion gap: 5 (ref 5–15)
BUN: 25 mg/dL — ABNORMAL HIGH (ref 6–20)
CO2: 28 mmol/L (ref 22–32)
Calcium: 8.7 mg/dL — ABNORMAL LOW (ref 8.9–10.3)
Chloride: 105 mmol/L (ref 101–111)
Creatinine, Ser: 1.26 mg/dL — ABNORMAL HIGH (ref 0.44–1.00)
GFR calc Af Amer: 48 mL/min — ABNORMAL LOW (ref >60)
GFR calc non Af Amer: 42 mL/min — ABNORMAL LOW (ref >60)
Glucose, Bld: 109 mg/dL — ABNORMAL HIGH (ref 65–99)
Potassium: 3.7 mmol/L (ref 3.5–5.1)
Sodium: 138 mmol/L (ref 135–145)
Total Bilirubin: 0.4 mg/dL (ref 0.3–1.2)
Total Protein: 7.1 g/dL (ref 6.5–8.1)

## 2015-11-07 LAB — CBC
HCT: 24.1 % — ABNORMAL LOW (ref 36.0–46.0)
HCT: 23.7 % — ABNORMAL LOW (ref 36.0–46.0)
Hemoglobin: 7.2 g/dL — ABNORMAL LOW (ref 12.0–15.0)
Hemoglobin: 7.2 g/dL — ABNORMAL LOW (ref 12.0–15.0)
MCH: 27.3 pg (ref 26.0–34.0)
MCH: 27.3 pg (ref 26.0–34.0)
MCHC: 29.9 g/dL — ABNORMAL LOW (ref 30.0–36.0)
MCHC: 30.4 g/dL (ref 30.0–36.0)
MCV: 91.3 fL (ref 78.0–100.0)
MCV: 89.8 fL (ref 78.0–100.0)
MPV: 9.6 fL (ref 8.6–12.4)
Platelets: 287 10*3/uL (ref 150–400)
Platelets: 283 10*3/uL (ref 150–400)
RBC: 2.64 MIL/uL — ABNORMAL LOW (ref 3.87–5.11)
RBC: 2.64 MIL/uL — ABNORMAL LOW (ref 3.87–5.11)
RDW: 17.1 % — ABNORMAL HIGH (ref 11.5–15.5)
RDW: 16.4 % — ABNORMAL HIGH (ref 11.5–15.5)
WBC: 5.3 10*3/uL (ref 4.0–10.5)
WBC: 5 10*3/uL (ref 4.0–10.5)

## 2015-11-07 LAB — FERRITIN: Ferritin: 3 ng/mL — ABNORMAL LOW (ref 11–307)

## 2015-11-07 LAB — I-STAT CHEM 8, ED
BUN: 28 mg/dL — ABNORMAL HIGH (ref 6–20)
Calcium, Ion: 1.05 mmol/L — ABNORMAL LOW (ref 1.13–1.30)
Chloride: 106 mmol/L (ref 101–111)
Creatinine, Ser: 1.1 mg/dL — ABNORMAL HIGH (ref 0.44–1.00)
Glucose, Bld: 125 mg/dL — ABNORMAL HIGH (ref 65–99)
HCT: 25 % — ABNORMAL LOW (ref 36.0–46.0)
Hemoglobin: 8.5 g/dL — ABNORMAL LOW (ref 12.0–15.0)
Potassium: 3.6 mmol/L (ref 3.5–5.1)
Sodium: 140 mmol/L (ref 135–145)
TCO2: 23 mmol/L (ref 0–100)

## 2015-11-07 LAB — IRON AND TIBC
Iron: 10 ug/dL — ABNORMAL LOW (ref 28–170)
Saturation Ratios: 2 % — ABNORMAL LOW (ref 10.4–31.8)
TIBC: 566 ug/dL — ABNORMAL HIGH (ref 250–450)
UIBC: 556 ug/dL

## 2015-11-07 LAB — FOLATE: Folate: 11.3 ng/mL (ref >5.9)

## 2015-11-07 LAB — I-STAT TROPONIN, ED
Troponin i, poc: 0 ng/mL (ref 0.00–0.08)
Troponin i, poc: 0 ng/mL (ref 0.00–0.08)

## 2015-11-07 LAB — VITAMIN B12: Vitamin B-12: 333 pg/mL (ref 180–914)

## 2015-11-07 LAB — PREPARE RBC (CROSSMATCH)

## 2015-11-07 LAB — LIPASE, BLOOD: Lipase: 67 U/L — ABNORMAL HIGH (ref 11–51)

## 2015-11-07 LAB — RETICULOCYTES
RBC.: 2.78 MIL/uL — ABNORMAL LOW (ref 3.87–5.11)
Retic Count, Absolute: 58.4 10*3/uL (ref 19.0–186.0)
Retic Ct Pct: 2.1 % (ref 0.4–3.1)

## 2015-11-07 LAB — POC OCCULT BLOOD, ED: Fecal Occult Bld: POSITIVE — AB

## 2015-11-07 MED ORDER — PANTOPRAZOLE SODIUM 40 MG IV SOLR
40.0000 mg | Freq: Two times a day (BID) | INTRAVENOUS | Status: DC
  Administered 2015-11-07 – 2015-11-10 (×6): 40 mg via INTRAVENOUS
  Filled 2015-11-07 (×6): qty 40

## 2015-11-07 NOTE — Telephone Encounter (Signed)
Ok to refill 

## 2015-11-07 NOTE — ED Provider Notes (Addendum)
CSN: [id number]     Arrival date & time 11/07/15  1502 History   First MD Initiated Contact with Patient 11/07/15 1943     Chief Complaint  Patient presents with  . Abnormal Lab     (Consider location/radiation/quality/duration/timing/severity/associated sxs/prior Treatment) HPI   Nicole Mccormick is a 69 y.o. female who presents for evaluation of global weakness, with difficulty walking because of dizziness, nausea, and feeling tired. No anorexia or vomiting. She denies seeing blood in stool. Her stools are chronically very dark because she takes iron daily. She has anemia from unknown source. She feels like she tastes blood in her oral secretions. No chest pain or shortness of breath. She states that she had a normal colonoscopy, one year ago.There are no other known modifying factors.   Past Medical History  Diagnosis Date  . Hypertension   . Abnormal Pap smear   . Asthma   . Coronary artery disease   . HIV infection ([place])     diagnosed before 2008  . Varicosities   . Seizures ([place])     last sz fri nov 2  . GERD (gastroesophageal reflux disease)     previously on aciphex, discontinued november 2012  because patient asymptomatic, and concern about interference with HIV meds.  May try pepcid in the future if symptoms return  . Iron deficiency anemia     Ferritin = 2 in november 2012, started on iron supplemenation  . Arthritis   . CHF (congestive heart failure) ([place])   . Heart murmur   . Hepatitis C antibody test positive    Past Surgical History  Procedure Laterality Date  . Abdominal hysterectomy    . Esophagogastroduodenoscopy  10/13/11    small hiatal hernia  . Colonoscopy  10/13/11    small adenoma, anal condyloma  . Colonoscopy  10/13/2011    Procedure: COLONOSCOPY;  Surgeon: Gatha Mayer, MD;  Location: [facility];  Service: Endoscopy;  Laterality: N/A;  . Esophagogastroduodenoscopy  10/13/2011    Procedure: ESOPHAGOGASTRODUODENOSCOPY (EGD);  Surgeon: Gatha Mayer,  MD;  Location: [facility] ENDOSCOPY;  Service: Endoscopy;  Laterality: N/A;  . Esophagogastroduodenoscopy N/A 09/14/2014    Procedure: ESOPHAGOGASTRODUODENOSCOPY (EGD);  Surgeon: Arta Silence, MD;  Location: [facility] ENDOSCOPY;  Service: Endoscopy;  Laterality: N/A;  . Colonoscopy N/A 09/14/2014    Procedure: COLONOSCOPY;  Surgeon: Arta Silence, MD;  Location: [facility] ENDOSCOPY;  Service: Endoscopy;  Laterality: N/A;  . Givens capsule study N/A 09/14/2014    Procedure: GIVENS CAPSULE STUDY;  Surgeon: Arta Silence, MD;  Location: [facility] ENDOSCOPY;  Service: Endoscopy;  Laterality: N/A;   Family History  Problem Relation Age of Onset  . Diabetes Mother   . Ovarian cancer Sister   . Cancer Sister     ovarian and colon  . Colon cancer Neg Hx    Social History  Substance Use Topics  . Smoking status: Current Every Day Smoker -- 0.75 packs/day for 50 years    Types: Cigarettes  . Smokeless tobacco: Never Used     Comment: trying to cut back  . Alcohol Use: 1.2 oz/week    2 Glasses of wine per week     Comment: glass of wine everyday.   OB History    Gravida Para Term Preterm AB TAB SAB Ectopic Multiple Living   6 5   1  1   5      Review of Systems  All other systems reviewed and are negative.     Allergies  Penicillins  and Avelox  Home Medications   Prior to Admission medications   Medication Sig Start Date End Date Taking? Authorizing Provider  acyclovir (ZOVIRAX) 400 MG tablet Take 1 tablet (400 mg total) by mouth daily as needed (for flare ups). 09/16/14  Yes Bartholomew Crews, MD  albuterol (PROVENTIL HFA;VENTOLIN HFA) 108 (90 BASE) MCG/ACT inhaler Inhale 2 puffs into the lungs every 6 (six) hours as needed for wheezing or shortness of breath. 07/21/15  Yes Shela Leff, MD  albuterol (PROVENTIL) (2.5 MG/3ML) 0.083% nebulizer solution Take 3 mLs (2.5 mg total) by nebulization every 6 (six) hours as needed for wheezing or shortness of breath. 07/21/15  Yes Shela Leff, MD   amLODipine (NORVASC) 10 MG tablet Take 1 tablet (10 mg total) by mouth daily. 09/14/15  Yes Campbell Riches, MD  baclofen (LIORESAL) 10 MG tablet Take 10 mg by mouth 3 (three) times daily as needed for muscle spasms.   Yes Historical Provider, MD  chlorthalidone (HYGROTON) 25 MG tablet Take 1 tablet (25 mg total) by mouth daily. 09/14/15  Yes Campbell Riches, MD  diazepam (VALIUM) 2 MG tablet take 1 tablet by mouth every 6 hours if needed for anxiety 07/21/15  Yes Shela Leff, MD  dolutegravir (TIVICAY) 50 MG tablet Take 1 tablet (50 mg total) by mouth daily. 10/30/15  Yes Campbell Riches, MD  emtricitabine-tenofovir AF (DESCOVY) 200-25 MG tablet Take 1 tablet by mouth daily. 10/30/15  Yes Campbell Riches, MD  ferrous sulfate 325 (65 FE) MG tablet Take 1 tablet (325 mg total) by mouth 3 (three) times daily with meals. 09/14/15  Yes Campbell Riches, MD  gabapentin (NEURONTIN) 600 MG tablet Take 600 mg by mouth 2 (two) times daily.   Yes Historical Provider, MD  isosorbide mononitrate (IMDUR) 30 MG 24 hr tablet Take 1 tablet (30 mg total) by mouth daily. 09/14/15  Yes Campbell Riches, MD  lisinopril (PRINIVIL,ZESTRIL) 40 MG tablet Take 1 tablet (40 mg total) by mouth daily. 09/14/15  Yes Campbell Riches, MD  loratadine (CLARITIN) 10 MG tablet Take 1 tablet (10 mg total) by mouth daily as needed for allergies. 07/21/15  Yes Shela Leff, MD  mometasone [facility]) 220 MCG/INH inhaler Inhale 1 puff into the lungs 2 (two) times daily. 07/21/15  Yes Shela Leff, MD  Nutritional Supplements (ENSURE NUTRA SHAKE HI-CAL) LIQD Take 1 Can by mouth 2 (two) times daily. 03/13/15  Yes Campbell Riches, MD  pantoprazole (PROTONIX) 40 MG tablet Take 1 tablet (40 mg total) by mouth 2 (two) times daily. 09/14/15  Yes Campbell Riches, MD  sucralfate (CARAFATE) 1 G tablet Take 1 tablet (1 g total) by mouth 4 (four) times daily. 09/08/14  Yes Oval Linsey, MD  azithromycin (ZITHROMAX) 250 MG tablet  2 po on day 1, 1 po on days 2-5 Patient not taking: Reported on 11/07/2015 10/30/15   Campbell Riches, MD  ondansetron (ZOFRAN-ODT) 4 MG disintegrating tablet dissolve 1 tablet ON TONGUE every 4 hours if needed for nausea and vomiting Patient not taking: Reported on 11/07/2015 11/07/15   Campbell Riches, MD  pneumococcal 13-valent conjugate vaccine (PREVNAR 13) SUSP injection Inject 0.5 mLs into the muscle tomorrow at 10 am. Patient not taking: Reported on 11/07/2015 07/21/15   Shela Leff, MD  ZOSTAVAX 91478 UNT/0.65ML injection Inject 19,400 Units into the skin once. Patient not taking: Reported on 11/07/2015 07/21/15   Shela Leff, MD   BP 161/61 mmHg  Pulse 54  Temp(Src)  98.6 F (37 C) (Oral)  Resp 21  SpO2 99% Physical Exam  Constitutional: She is oriented to person, place, and time. She appears well-developed and well-nourished. No distress.  HENT:  Head: Normocephalic and atraumatic.  Right Ear: External ear normal.  Left Ear: External ear normal.  Eyes: Conjunctivae and EOM are normal. Pupils are equal, round, and reactive to light.  Neck: Normal range of motion and phonation normal. Neck supple.  Cardiovascular: Normal rate, regular rhythm and normal heart sounds.   Pulmonary/Chest: Effort normal and breath sounds normal. She exhibits no bony tenderness.  Abdominal: Soft. There is tenderness (epigastric, mild).  Musculoskeletal: Normal range of motion.  Neurological: She is alert and oriented to person, place, and time. No cranial nerve deficit or sensory deficit. She exhibits normal muscle tone. Coordination normal.  Skin: Skin is warm, dry and intact.  Psychiatric: She has a normal mood and affect. Her behavior is normal. Judgment and thought content normal.  Nursing note and vitals reviewed.   ED Course  Procedures (including critical care time)   Medications - No data to display  Patient Vitals for the past 24 hrs:  BP Temp Temp src Pulse Resp SpO2   11/07/15 2030 161/61 mmHg - - (!) 54 21 99 %  11/07/15 2015 (!) 117/47 mmHg - - (!) 57 - 99 %  11/07/15 1945 130/55 mmHg - - (!) 51 21 96 %  11/07/15 1526 155/55 mmHg 98.6 F (37 C) Oral (!) 55 18 97 %    8:58 PM Reevaluation with update and discussion. After initial assessment and treatment, an updated evaluation reveals patient remains comfortable. Findings discussed with patient, and family members, all questions were answered. Illene Sweeting L   Blood transfusion 2 units packed red cells ordered.  8:59 PM-Consult complete with [facility] resident. Patient case explained and discussed. She agrees to admit patient for further evaluation and treatment. Call ended at 21:20  21:50- discussed the case, with Dr. Laurence Spates, gastroenterology, who will arrange for the patient to be seen tomorrow for consultation.   CRITICAL CARE Performed by: Richarda Blade Total critical care time: 45 minutes minutes Critical care time was exclusive of separately billable procedures and treating other patients. Critical care was necessary to treat or prevent imminent or life-threatening deterioration. Critical care was time spent personally by me on the following activities: development of treatment plan with patient and/or surrogate as well as nursing, discussions with consultants, evaluation of patient's response to treatment, examination of patient, obtaining history from patient or surrogate, ordering and performing treatments and interventions, ordering and review of laboratory studies, ordering and review of radiographic studies, pulse oximetry and re-evaluation of patient's condition.   Labs Review Labs Reviewed  LIPASE, BLOOD - Abnormal; Notable for the following:    Lipase 67 (*)    All other components within normal limits  COMPREHENSIVE METABOLIC PANEL - Abnormal; Notable for the following:    Glucose, Bld 109 (*)    BUN 25 (*)    Creatinine, Ser 1.26 (*)    Calcium 8.7 (*)    Albumin 3.4 (*)     ALT 11 (*)    GFR calc non Af Amer 42 (*)    GFR calc Af Amer 48 (*)    All other components within normal limits  CBC - Abnormal; Notable for the following:    RBC 2.64 (*)    Hemoglobin 7.2 (*)    HCT 23.7 (*)    RDW 16.4 (*)  All other components within normal limits  URINALYSIS, ROUTINE W REFLEX MICROSCOPIC (NOT AT [facility])  I-STAT TROPOININ, ED  POC OCCULT BLOOD, ED    HEMOGLOBIN  Date Value Ref Range Status  11/07/2015 7.2* 12.0 - 15.0 g/dL Final  11/07/2015 7.2* 12.0 - 15.0 g/dL Final  10/30/2015 9.1* 12.0 - 15.0 g/dL Final  02/12/2015 12.9 12.0 - 15.0 g/dL Final     Imaging Review No results found. I have personally reviewed and evaluated these images and lab results as part of my medical decision-making.   EKG Interpretation None      MDM   Final diagnoses:  Rectal bleeding  Iron deficiency anemia due to chronic blood loss    Rectal bleeding causing anemia with symptoms of dizziness with ambulation. Likely source of bleeding is AVM, in small intestines. She will require admission for blood transfusion, and stabilization.  Nursing Notes Reviewed/ Care Coordinated, and agree without changes. Applicable Imaging Reviewed.  Interpretation of Laboratory Data incorporated into ED treatment  Plan: Admit    Daleen Bo, MD 11/07/15 2123  Daleen Bo, MD 11/07/15 2238

## 2015-11-07 NOTE — Telephone Encounter (Signed)
Pt given Dr. Erlinda Hong office number to request an appt.

## 2015-11-07 NOTE — ED Notes (Addendum)
Pt was sent from PCP for low hemoglobin per pt. Pt also reports feeling fatigue and weak over the last week. Pt has history of amenia. Pt also nauseous with no vomiting. Pt states she feels like she can taste blood but has not seen any. Denies any black stools. Pt states she was told to come to the ED for possible GI bleed last week but waited until today because she is no better. Pt also has upper abd pain. Pt also reports fluttering in her chest that has been going on for months now.

## 2015-11-07 NOTE — Telephone Encounter (Signed)
Patient in office for lab (repeat CBC), experiencing nausea.  She is asking for a prescription to be sent to [facility].  Zofran written by another physician. Please advise if ok to refill. Landis Gandy, RN

## 2015-11-07 NOTE — H&P (Signed)
Date: 11/07/2015               Patient Name:  Nicole Mccormick MRN: [id number]  DOB: Aug 03, 1946 Age / Sex: 69 y.o., female   PCP: Shela Leff, MD         Medical Service: Internal Medicine Teaching Service         Attending Physician: Dr. Daleen Bo, MD    First Contact: Dr. Berline Lopes Pager: [phone]  Second Contact: Dr. Osa Craver Pager: [phone]       After Hours (After 5p/  First Contact Pager: [phone]  weekends / holidays): Second Contact Pager: [phone]   Chief Complaint: "I've been lightheaded when I walk."  History of Present Illness: Nicole Mccormick is a 69 year old African American lady with history of gastrointestinal bleed suspected to be from arteriovenous malformations, gastroesophageal reflux disease, well-controlled HIV, hypertension, tobacco abuse, coronary artery disease, and hepatitis C antibody positive presenting with lightheadedness on exertion, epigastric abdominal tenderness, lethargy, and nausea.  Her symptoms all started last week and have been getting progressively worse. She denies any change in her stool color or blood in her stool; she says it's been the same dark color since she started taking iron pills last year. She says she sometimes tastes blood in the back of her throat but denies any hematemesis. She has epigastric abdominal pain that is only present when someone touches it, but denies any pain elsewhere. Sometimes she has chest pain on exertion that has been slightly worse over the last 2 weeks but denies any symptoms at rest. She takes Tylenol and aspirin 81mg  daily but denies any other NSAIDs, and she's been taking Protonix 40mg  daily as prescribed.  In October 2015, she presented with another gastrointestinal bleed; at that time, a colonoscopy was unrevealing but endoscopy showed small AVMs in the proximal small bowel and erosions. A pill enteroscopy showed AVMs in her proximal small bowel as well, so it was suspected AVMs were contributing to  her bleed. She was supposed to follow-up with Gastroenterology, with possible referral for double-balloon enteroscopy at [facility], but she did not keep the appointment.  In the emergency department, her blood pressure was 150/50, pulse 55, saturating 100% on room air, afebrile. Her hemoglobin was 7.2 with an MCV of 90, down from 9.1 last week. Her BUN was 25, creatinine 1.26, above over baseline of less than 1. A fecal occult blood test was positive. She was transfused 2 units of PRBCs and started on pantoprazole 40mg  IV twice daily in the emergency department and admitted to IMTS.  Meds: Current Facility-Administered Medications  Medication Dose Route Frequency Provider Last Rate Last Dose  . pantoprazole (PROTONIX) injection 40 mg  40 mg Intravenous Q12H Jones Bales, MD   40 mg at 11/07/15 2214   Current Outpatient Prescriptions  Medication Sig Dispense Refill  . acyclovir (ZOVIRAX) 400 MG tablet Take 1 tablet (400 mg total) by mouth daily as needed (for flare ups). 30 tablet 3  . albuterol (PROVENTIL HFA;VENTOLIN HFA) 108 (90 BASE) MCG/ACT inhaler Inhale 2 puffs into the lungs every 6 (six) hours as needed for wheezing or shortness of breath. 1 Inhaler 2  . albuterol (PROVENTIL) (2.5 MG/3ML) 0.083% nebulizer solution Take 3 mLs (2.5 mg total) by nebulization every 6 (six) hours as needed for wheezing or shortness of breath. 75 mL 12  . amLODipine (NORVASC) 10 MG tablet Take 1 tablet (10 mg total) by mouth daily. 90 tablet 3  . baclofen (LIORESAL)  10 MG tablet Take 10 mg by mouth 3 (three) times daily as needed for muscle spasms.    . chlorthalidone (HYGROTON) 25 MG tablet Take 1 tablet (25 mg total) by mouth daily. 90 tablet 3  . diazepam (VALIUM) 2 MG tablet take 1 tablet by mouth every 6 hours if needed for anxiety 30 tablet 0  . dolutegravir (TIVICAY) 50 MG tablet Take 1 tablet (50 mg total) by mouth daily. 90 tablet 3  . emtricitabine-tenofovir AF (DESCOVY) 200-25 MG tablet Take 1  tablet by mouth daily. 90 tablet 3  . ferrous sulfate 325 (65 FE) MG tablet Take 1 tablet (325 mg total) by mouth 3 (three) times daily with meals. 90 tablet 3  . gabapentin (NEURONTIN) 600 MG tablet Take 600 mg by mouth 2 (two) times daily.    . isosorbide mononitrate (IMDUR) 30 MG 24 hr tablet Take 1 tablet (30 mg total) by mouth daily. 90 tablet 3  . lisinopril (PRINIVIL,ZESTRIL) 40 MG tablet Take 1 tablet (40 mg total) by mouth daily. 90 tablet 3  . loratadine (CLARITIN) 10 MG tablet Take 1 tablet (10 mg total) by mouth daily as needed for allergies. 90 tablet 3  . mometasone (ASMANEX) 220 MCG/INH inhaler Inhale 1 puff into the lungs 2 (two) times daily. 1 Inhaler 2  . Nutritional Supplements (ENSURE NUTRA SHAKE HI-CAL) LIQD Take 1 Can by mouth 2 (two) times daily. 60 Can 11  . pantoprazole (PROTONIX) 40 MG tablet Take 1 tablet (40 mg total) by mouth 2 (two) times daily. 180 tablet 3  . sucralfate (CARAFATE) 1 G tablet Take 1 tablet (1 g total) by mouth 4 (four) times daily. 120 tablet 11  . azithromycin (ZITHROMAX) 250 MG tablet 2 po on day 1, 1 po on days 2-5 (Patient not taking: Reported on 11/07/2015) 6 each 0  . ondansetron (ZOFRAN-ODT) 4 MG disintegrating tablet dissolve 1 tablet ON TONGUE every 4 hours if needed for nausea and vomiting (Patient not taking: Reported on 11/07/2015) 20 tablet 2  . pneumococcal 13-valent conjugate vaccine (PREVNAR 13) SUSP injection Inject 0.5 mLs into the muscle tomorrow at 10 am. (Patient not taking: Reported on 11/07/2015) 0.5 mL 0  . ZOSTAVAX 91478 UNT/0.65ML injection Inject 19,400 Units into the skin once. (Patient not taking: Reported on 11/07/2015) 1 each 0    Allergies: Allergies as of 11/07/2015 - Review Complete 11/07/2015  Allergen Reaction Noted  . Penicillins  01/26/2007  . Avelox [moxifloxacin hcl in nacl] Itching and Rash 10/13/2011   Past Medical History  Diagnosis Date  . Hypertension   . Abnormal Pap smear   . Asthma   . Coronary  artery disease   . HIV infection ([place])     diagnosed before 2008  . Varicosities   . Seizures ([place])     last sz fri nov 2  . GERD (gastroesophageal reflux disease)     previously on aciphex, discontinued november 2012  because patient asymptomatic, and concern about interference with HIV meds.  May try pepcid in the future if symptoms return  . Iron deficiency anemia     Ferritin = 2 in november 2012, started on iron supplemenation  . Arthritis   . CHF (congestive heart failure) ([place])   . Heart murmur   . Hepatitis C antibody test positive    Past Surgical History  Procedure Laterality Date  . Abdominal hysterectomy    . Esophagogastroduodenoscopy  10/13/11    small hiatal hernia  . Colonoscopy  10/13/11  small adenoma, anal condyloma  . Colonoscopy  10/13/2011    Procedure: COLONOSCOPY;  Surgeon: Gatha Mayer, MD;  Location: [facility];  Service: Endoscopy;  Laterality: N/A;  . Esophagogastroduodenoscopy  10/13/2011    Procedure: ESOPHAGOGASTRODUODENOSCOPY (EGD);  Surgeon: Gatha Mayer, MD;  Location: [facility] ENDOSCOPY;  Service: Endoscopy;  Laterality: N/A;  . Esophagogastroduodenoscopy N/A 09/14/2014    Procedure: ESOPHAGOGASTRODUODENOSCOPY (EGD);  Surgeon: Arta Silence, MD;  Location: [facility] ENDOSCOPY;  Service: Endoscopy;  Laterality: N/A;  . Colonoscopy N/A 09/14/2014    Procedure: COLONOSCOPY;  Surgeon: Arta Silence, MD;  Location: [facility] ENDOSCOPY;  Service: Endoscopy;  Laterality: N/A;  . Givens capsule study N/A 09/14/2014    Procedure: GIVENS CAPSULE STUDY;  Surgeon: Arta Silence, MD;  Location: [facility] ENDOSCOPY;  Service: Endoscopy;  Laterality: N/A;   Family History  Problem Relation Age of Onset  . Diabetes Mother   . Ovarian cancer Sister   . Cancer Sister     ovarian and colon  . Colon cancer Neg Hx    Social History   Social History  . Marital Status: Single    Spouse Name: N/A  . Number of Children: N/A  . Years of Education: N/A   Occupational History  . Not  on file.   Social History Main Topics  . Smoking status: Current Every Day Smoker -- 0.75 packs/day for 50 years    Types: Cigarettes  . Smokeless tobacco: Never Used     Comment: trying to cut back  . Alcohol Use: 1.2 oz/week    2 Glasses of wine per week     Comment: glass of wine everyday.  . Drug Use: No  . Sexual Activity: Not on file     Comment: pt. given condoms   Other Topics Concern  . Not on file   Social History Narrative    Review of Systems  Constitutional: Positive for malaise/fatigue. Negative for fever, chills, weight loss and diaphoresis.  HENT: Negative for hearing loss.   Eyes: Negative for blurred vision.  Respiratory: Negative for cough, hemoptysis and shortness of breath.   Cardiovascular: Negative for chest pain, palpitations, orthopnea and leg swelling.  Gastrointestinal: Positive for nausea and abdominal pain. Negative for heartburn, vomiting, diarrhea, constipation, blood in stool and melena.  Genitourinary: Negative for dysuria and urgency.  Musculoskeletal: Negative for myalgias and neck pain.  Skin: Negative for itching and rash.  Neurological: Positive for dizziness and weakness. Negative for sensory change, speech change, focal weakness, loss of consciousness and headaches.  Psychiatric/Behavioral: Negative for depression. The patient is not nervous/anxious.    Physical Exam: Blood pressure 153/63, pulse 56, temperature 98.6 F (37 C), temperature source Oral, resp. rate 13, SpO2 100 %. General: well-appearing African-American lady resting in bed comfortably, appropriately conversational, joking around HEENT: conjuctival pallor, no scleral icterus, extra-ocular muscles intact, oropharynx without lesions Cardiac: bradycardic with 2/6 systolic murmur loudest over the RUSB, normal S1/S2 Pulm: breathing well, clear to auscultation bilaterally Abd: bowel sounds normal, soft, nondistended, tender to the epigastrum with very light palpation Ext: warm  and well perfused, without pedal edema Lymph: no cervical or supraclavicular lymphadenopathy Skin: no rash, hair, or nail changes Neuro: alert and oriented X3, cranial nerves II-XII grossly intact, moving all extremities well  Lab results: Basic Metabolic Panel:  Recent Labs  11/07/15 1538 11/07/15 2204  NA 138 140  K 3.7 3.6  CL 105 106  CO2 28  --   GLUCOSE 109* 125*  BUN 25* 28*  CREATININE  1.26* 1.10*  CALCIUM 8.7*  --    Liver Function Tests:  Recent Labs  11/07/15 1538  AST 19  ALT 11*  ALKPHOS 74  BILITOT 0.4  PROT 7.1  ALBUMIN 3.4*    Recent Labs  11/07/15 1538  LIPASE 67*   CBC:  Recent Labs  11/07/15 1538 11/07/15 2156 11/07/15 2204  WBC 5.0 5.0  --   NEUTROABS  --  PENDING  --   HGB 7.2* 7.5* 8.5*  HCT 23.7* 25.1* 25.0*  MCV 89.8 90.3  --   PLT 283 270  --    Anemia Panel:  Recent Labs  11/07/15 2156  RETICCTPCT 2.1   Urine Drug Screen: Drugs of Abuse     Component Value Date/Time   LABOPIA POSITIVE* 03/09/2014 1600   LABOPIA NEGATIVE 12/26/2010 0137   COCAINSCRNUR NONE DETECTED 03/09/2014 1600   COCAINSCRNUR NEGATIVE 12/26/2010 0137   LABBENZ NONE DETECTED 03/09/2014 1600   LABBENZ NEGATIVE 12/26/2010 0137   AMPHETMU NONE DETECTED 03/09/2014 1600   AMPHETMU NEGATIVE 12/26/2010 0137   THCU NONE DETECTED 03/09/2014 1600   LABBARB NONE DETECTED 03/09/2014 1600    Imaging results:  Dg Chest Port 1 View  11/07/2015  CLINICAL DATA:  Low hemoglobin and fatigue weakness 1 week EXAM: PORTABLE CHEST 1 VIEW COMPARISON:  09/12/2014 FINDINGS: Stable mild to moderate cardiac enlargement. Vascular pattern normal. No consolidation or effusion. IMPRESSION: Stable cardiac enlargement with no acute findings Electronically Signed   By: Skipper Cliche M.D.   On: 11/07/2015 21:29   Other results: EKG: sinus bradycardia, unchanged from previous EKGs, non-ischemic  Assessment & Plan by Problem:  Nicole Mccormick is a 69 year old African American lady  with a history of a gastrointestinal bleed suspected to be from arteriovenous malformations, gastroesophageal reflux disease, well-controlled HIV, hypertension, tobacco abuse, coronary artery disease, and hepatitis C antibody positive presenting with symptomatic anemia, found to have a hemoglobin of 7.2 with a positive fecal occult blood test, along with an aortic stenosis murmur, suggestive of another gastrointestinal bleed, perhaps from arteriovenous malformations as these were visualized last year but she never followed-up for push versus double balloon enteroscopy. She has been compliant with taking her pantoprazole and oral iron, and has not been taking besides aspirin 81mg  daily. We'll continue her on IV pantoprazole, hold her aspirin and anti-hypertensives, and the emergency physician has consulted gastroenterology for them to evaluate her tomorrow.  Gastrointestinal bleed: Per above, possibly due to arteriovenous malformations. -Gastroenterology consulted; thank you for your help -Transfused 2 units PRBCs -Obtained iron panel prior to transfusion -Pantoprazole 40mg  IV twice daily -Holding aspirin -Holding antihypertensives  Coronary artery disease: She says she had 2 silent myocardial infarctions in the past without stenting or CABG and has stable angina. I don't see a cath in her records but an echo in April 2015 showed an ejection fraction of 60%. Her EKG shows sinus bradycardia but is non-ischemic and a troponin was normal. She was taking aspirin before which we'll hold. -Holding aspirin 81mg  daily  Human immunodeficiency virus: Her last CD4 was 550 with an undetectable viral load. She's compliant with her anti-retrovirals. -Continue Tivicay and Descovy  Chronic back pain: Not an acute issue. -Continue acetaminophen 650mg  every 6 hours for pain -Continue gabapentin 600mg  twice daily -Continue baclofen 10mg  three times daily as needed for muscle spasms  Hypertension: Blood pressures  look okay so will hold anti-hypertensives. -Holding imdur 30mg , lisinopril 40mg , chlorthalidone 25mg ,   Anxiety: Her mood is normal. -Continue diazepam  2mg  every 6 hours as needed for anxiety  Chronic obstructive pulmonary disease: Stable, not on home oxygen. -Continue mometasone inhaler twice daily -Continue albuterol nebulizers every 6 hours as needed for wheezing  Restless leg syndrome: Likely worsened by her anemia.   Dispo: Disposition is deferred at this time, awaiting improvement of current medical problems.  The patient does have a current PCP Shela Leff, MD) and does need an [facility] hospital follow-up appointment after discharge.  The patient does not know have transportation limitations that hinder transportation to clinic appointments.  Signed: Loleta Chance, MD 11/07/2015, 10:29 PM

## 2015-11-07 NOTE — ED Notes (Signed)
Report attempted 

## 2015-11-08 ENCOUNTER — Telehealth: Payer: Self-pay | Admitting: *Deleted

## 2015-11-08 ENCOUNTER — Encounter (HOSPITAL_COMMUNITY): Payer: Self-pay | Admitting: General Practice

## 2015-11-08 ENCOUNTER — Encounter (HOSPITAL_COMMUNITY)
Admission: EM | Disposition: A | Discharge status: Home / Self Care | Payer: Self-pay | Source: Home / Self Care | Attending: Emergency Medicine

## 2015-11-08 DIAGNOSIS — I2510 Atherosclerotic heart disease of native coronary artery without angina pectoris: Secondary | ICD-10-CM

## 2015-11-08 DIAGNOSIS — I10 Essential (primary) hypertension: Secondary | ICD-10-CM | POA: Diagnosis not present

## 2015-11-08 DIAGNOSIS — R072 Precordial pain: Secondary | ICD-10-CM

## 2015-11-08 DIAGNOSIS — K922 Gastrointestinal hemorrhage, unspecified: Secondary | ICD-10-CM | POA: Diagnosis not present

## 2015-11-08 DIAGNOSIS — F17200 Nicotine dependence, unspecified, uncomplicated: Secondary | ICD-10-CM

## 2015-11-08 DIAGNOSIS — D500 Iron deficiency anemia secondary to blood loss (chronic): Secondary | ICD-10-CM | POA: Diagnosis not present

## 2015-11-08 DIAGNOSIS — B1920 Unspecified viral hepatitis C without hepatic coma: Secondary | ICD-10-CM

## 2015-11-08 DIAGNOSIS — D509 Iron deficiency anemia, unspecified: Secondary | ICD-10-CM | POA: Diagnosis not present

## 2015-11-08 DIAGNOSIS — J45909 Unspecified asthma, uncomplicated: Secondary | ICD-10-CM | POA: Diagnosis not present

## 2015-11-08 DIAGNOSIS — R001 Bradycardia, unspecified: Secondary | ICD-10-CM | POA: Diagnosis not present

## 2015-11-08 DIAGNOSIS — R079 Chest pain, unspecified: Secondary | ICD-10-CM | POA: Insufficient documentation

## 2015-11-08 DIAGNOSIS — Z86010 Personal history of colonic polyps: Secondary | ICD-10-CM

## 2015-11-08 DIAGNOSIS — R42 Dizziness and giddiness: Secondary | ICD-10-CM | POA: Diagnosis not present

## 2015-11-08 DIAGNOSIS — B20 Human immunodeficiency virus [HIV] disease: Secondary | ICD-10-CM | POA: Diagnosis not present

## 2015-11-08 DIAGNOSIS — I459 Conduction disorder, unspecified: Secondary | ICD-10-CM | POA: Insufficient documentation

## 2015-11-08 DIAGNOSIS — Z8279 Family history of other congenital malformations, deformations and chromosomal abnormalities: Secondary | ICD-10-CM

## 2015-11-08 HISTORY — PX: CARDIAC CATHETERIZATION: SHX172

## 2015-11-08 LAB — CBC
HCT: 25.2 % — ABNORMAL LOW (ref 36.0–46.0)
HCT: 31.2 % — ABNORMAL LOW (ref 36.0–46.0)
Hemoglobin: 7.8 g/dL — ABNORMAL LOW (ref 12.0–15.0)
Hemoglobin: 9.9 g/dL — ABNORMAL LOW (ref 12.0–15.0)
MCH: 28.1 pg (ref 26.0–34.0)
MCH: 28.4 pg (ref 26.0–34.0)
MCHC: 31 g/dL (ref 30.0–36.0)
MCHC: 31.7 g/dL (ref 30.0–36.0)
MCV: 90.6 fL (ref 78.0–100.0)
MCV: 89.4 fL (ref 78.0–100.0)
Platelets: 239 10*3/uL (ref 150–400)
Platelets: 207 10*3/uL (ref 150–400)
RBC: 2.78 MIL/uL — ABNORMAL LOW (ref 3.87–5.11)
RBC: 3.49 MIL/uL — ABNORMAL LOW (ref 3.87–5.11)
RDW: 16.3 % — ABNORMAL HIGH (ref 11.5–15.5)
RDW: 16.4 % — ABNORMAL HIGH (ref 11.5–15.5)
WBC: 4.6 10*3/uL (ref 4.0–10.5)
WBC: 5.7 10*3/uL (ref 4.0–10.5)

## 2015-11-08 LAB — URINALYSIS, ROUTINE W REFLEX MICROSCOPIC
Bilirubin Urine: NEGATIVE
Glucose, UA: NEGATIVE mg/dL
Hgb urine dipstick: NEGATIVE
Ketones, ur: NEGATIVE mg/dL
Nitrite: NEGATIVE
Protein, ur: NEGATIVE mg/dL
Specific Gravity, Urine: 1.015 (ref 1.005–1.030)
pH: 6.5 (ref 5.0–8.0)

## 2015-11-08 LAB — MAGNESIUM: Magnesium: 2.4 mg/dL (ref 1.7–2.4)

## 2015-11-08 LAB — URINE MICROSCOPIC-ADD ON: RBC / HPF: NONE SEEN RBC/hpf (ref 0–5)

## 2015-11-08 LAB — GLUCOSE, CAPILLARY: Glucose-Capillary: 105 mg/dL — ABNORMAL HIGH (ref 65–99)

## 2015-11-08 SURGERY — LEFT HEART CATH AND CORONARY ANGIOGRAPHY
Anesthesia: LOCAL

## 2015-11-08 MED ORDER — ACETAMINOPHEN 325 MG PO TABS
650.0000 mg | ORAL_TABLET | ORAL | Status: DC | PRN
  Administered 2015-11-09: 650 mg via ORAL
  Filled 2015-11-08: qty 2

## 2015-11-08 MED ORDER — LIDOCAINE HCL (PF) 1 % IJ SOLN
INTRAMUSCULAR | Status: DC | PRN
  Administered 2015-11-08: 17:00:00

## 2015-11-08 MED ORDER — HEPARIN SODIUM (PORCINE) 1000 UNIT/ML IJ SOLN
INTRAMUSCULAR | Status: DC | PRN
  Administered 2015-11-08: 3000 [IU] via INTRAVENOUS

## 2015-11-08 MED ORDER — ONDANSETRON HCL 4 MG/2ML IJ SOLN: 4.0000 mg | Freq: Four times a day (QID) | INTRAMUSCULAR | Status: DC | PRN

## 2015-11-08 MED ORDER — SUCRALFATE 1 G PO TABS
1.0000 g | ORAL_TABLET | Freq: Four times a day (QID) | ORAL | Status: DC
  Administered 2015-11-08 – 2015-11-10 (×10): 1 g via ORAL
  Filled 2015-11-08 (×8): qty 1

## 2015-11-08 MED ORDER — MOMETASONE FUROATE 220 MCG/INH IN AEPB: 1.0000 | INHALATION_SPRAY | Freq: Two times a day (BID) | RESPIRATORY_TRACT | Status: DC

## 2015-11-08 MED ORDER — VERAPAMIL HCL 2.5 MG/ML IV SOLN
INTRAVENOUS | Status: DC | PRN
  Administered 2015-11-08: 16:00:00 via INTRA_ARTERIAL

## 2015-11-08 MED ORDER — ALBUTEROL SULFATE (2.5 MG/3ML) 0.083% IN NEBU: 2.5000 mg | INHALATION_SOLUTION | Freq: Four times a day (QID) | RESPIRATORY_TRACT | Status: DC | PRN

## 2015-11-08 MED ORDER — MOMETASONE FURO-FORMOTEROL FUM 200-5 MCG/ACT IN AERO
2.0000 | INHALATION_SPRAY | Freq: Two times a day (BID) | RESPIRATORY_TRACT | Status: DC
  Administered 2015-11-08 – 2015-11-10 (×5): 2 via RESPIRATORY_TRACT
  Filled 2015-11-08: qty 8.8

## 2015-11-08 MED ORDER — FENTANYL CITRATE (PF) 100 MCG/2ML IJ SOLN
INTRAMUSCULAR | Status: DC | PRN
  Administered 2015-11-08: 25 ug via INTRAVENOUS

## 2015-11-08 MED ORDER — SODIUM CHLORIDE 0.9 % IJ SOLN: 3.0000 mL | INTRAMUSCULAR | Status: DC | PRN

## 2015-11-08 MED ORDER — ASPIRIN 81 MG PO CHEW
81.0000 mg | CHEWABLE_TABLET | ORAL | Status: AC
  Administered 2015-11-08: 81 mg via ORAL

## 2015-11-08 MED ORDER — EMTRICITABINE-TENOFOVIR AF 200-25 MG PO TABS
1.0000 | ORAL_TABLET | Freq: Every day | ORAL | Status: DC
  Administered 2015-11-08 – 2015-11-10 (×3): 1 via ORAL
  Filled 2015-11-08 (×4): qty 1

## 2015-11-08 MED ORDER — LISINOPRIL 10 MG PO TABS
10.0000 mg | ORAL_TABLET | Freq: Every day | ORAL | Status: DC
  Administered 2015-11-08: 10 mg via ORAL
  Filled 2015-11-08: qty 1

## 2015-11-08 MED ORDER — SODIUM CHLORIDE 0.9 % IV SOLN: 250.0000 mL | INTRAVENOUS | Status: DC | PRN

## 2015-11-08 MED ORDER — BUDESONIDE 0.25 MG/2ML IN SUSP: 0.2500 mg | Freq: Two times a day (BID) | RESPIRATORY_TRACT | Status: DC

## 2015-11-08 MED ORDER — VERAPAMIL HCL 2.5 MG/ML IV SOLN
INTRAVENOUS | Status: AC
  Filled 2015-11-08: qty 2

## 2015-11-08 MED ORDER — FENTANYL CITRATE (PF) 100 MCG/2ML IJ SOLN
INTRAMUSCULAR | Status: AC
  Filled 2015-11-08: qty 2

## 2015-11-08 MED ORDER — HEPARIN SODIUM (PORCINE) 5000 UNIT/ML IJ SOLN
5000.0000 [IU] | Freq: Three times a day (TID) | INTRAMUSCULAR | Status: DC
  Administered 2015-11-08 – 2015-11-10 (×7): 5000 [IU] via SUBCUTANEOUS
  Filled 2015-11-08 (×5): qty 1

## 2015-11-08 MED ORDER — AMLODIPINE BESYLATE 5 MG PO TABS: 5.0000 mg | ORAL_TABLET | Freq: Every day | ORAL | Status: DC

## 2015-11-08 MED ORDER — SODIUM CHLORIDE 0.9 % IJ SOLN
3.0000 mL | Freq: Two times a day (BID) | INTRAMUSCULAR | Status: DC
Start: 2015-11-08 — End: 2015-11-08
  Administered 2015-11-08: 3 mL via INTRAVENOUS

## 2015-11-08 MED ORDER — MIDAZOLAM HCL 2 MG/2ML IJ SOLN
INTRAMUSCULAR | Status: DC | PRN
  Administered 2015-11-08: 1 mg via INTRAVENOUS

## 2015-11-08 MED ORDER — ENSURE ENLIVE PO LIQD
237.0000 mL | Freq: Two times a day (BID) | ORAL | Status: DC
  Administered 2015-11-08 – 2015-11-10 (×5): 237 mL via ORAL

## 2015-11-08 MED ORDER — FERROUS SULFATE 325 (65 FE) MG PO TABS
325.0000 mg | ORAL_TABLET | Freq: Every day | ORAL | Status: DC
  Administered 2015-11-09 – 2015-11-10 (×2): 325 mg via ORAL
  Filled 2015-11-08 (×3): qty 1

## 2015-11-08 MED ORDER — ENSURE NUTRA SHAKE HI-CAL PO LIQD: 1.0000 | Freq: Two times a day (BID) | ORAL | Status: DC

## 2015-11-08 MED ORDER — DIAZEPAM 2 MG PO TABS
2.0000 mg | ORAL_TABLET | Freq: Three times a day (TID) | ORAL | Status: DC | PRN
  Administered 2015-11-10: 2 mg via ORAL
  Filled 2015-11-08: qty 1

## 2015-11-08 MED ORDER — DOLUTEGRAVIR SODIUM 50 MG PO TABS
50.0000 mg | ORAL_TABLET | Freq: Every day | ORAL | Status: DC
  Administered 2015-11-08 – 2015-11-10 (×3): 50 mg via ORAL
  Filled 2015-11-08 (×3): qty 1

## 2015-11-08 MED ORDER — SODIUM CHLORIDE 0.9 % IJ SOLN
3.0000 mL | Freq: Two times a day (BID) | INTRAMUSCULAR | Status: DC
  Administered 2015-11-09 – 2015-11-10 (×4): 3 mL via INTRAVENOUS

## 2015-11-08 MED ORDER — ACYCLOVIR 400 MG PO TABS
400.0000 mg | ORAL_TABLET | Freq: Every day | ORAL | Status: DC | PRN
  Administered 2015-11-09: 400 mg via ORAL
  Filled 2015-11-08 (×2): qty 1

## 2015-11-08 MED ORDER — MIDAZOLAM HCL 2 MG/2ML IJ SOLN
INTRAMUSCULAR | Status: AC
  Filled 2015-11-08: qty 2

## 2015-11-08 MED ORDER — ACETAMINOPHEN 325 MG PO TABS
650.0000 mg | ORAL_TABLET | Freq: Four times a day (QID) | ORAL | Status: DC | PRN
Start: 2015-11-08 — End: 2015-11-08

## 2015-11-08 MED ORDER — HEPARIN SODIUM (PORCINE) 1000 UNIT/ML IJ SOLN
INTRAMUSCULAR | Status: AC
  Filled 2015-11-08: qty 1

## 2015-11-08 MED ORDER — SODIUM CHLORIDE 0.9 % IV SOLN: INTRAVENOUS | Status: DC

## 2015-11-08 MED ORDER — LORATADINE 10 MG PO TABS: 10.0000 mg | ORAL_TABLET | Freq: Every day | ORAL | Status: DC | PRN

## 2015-11-08 MED ORDER — BACLOFEN 10 MG PO TABS
10.0000 mg | ORAL_TABLET | Freq: Three times a day (TID) | ORAL | Status: DC | PRN
  Administered 2015-11-09: 10 mg via ORAL
  Filled 2015-11-08 (×2): qty 1

## 2015-11-08 MED ORDER — MOMETASONE FURO-FORMOTEROL FUM 100-5 MCG/ACT IN AERO: 2.0000 | INHALATION_SPRAY | Freq: Two times a day (BID) | RESPIRATORY_TRACT | Status: DC

## 2015-11-08 MED ORDER — LIDOCAINE HCL (PF) 1 % IJ SOLN
INTRAMUSCULAR | Status: AC
  Filled 2015-11-08: qty 30

## 2015-11-08 MED ORDER — HEPARIN (PORCINE) IN NACL 2-0.9 UNIT/ML-% IJ SOLN
INTRAMUSCULAR | Status: AC
  Filled 2015-11-08: qty 1500

## 2015-11-08 MED ORDER — ACETAMINOPHEN 650 MG RE SUPP: 650.0000 mg | Freq: Four times a day (QID) | RECTAL | Status: DC | PRN

## 2015-11-08 MED ORDER — SODIUM CHLORIDE 0.9 % IV SOLN
INTRAVENOUS | Status: DC | PRN
  Administered 2015-11-08: 61 mL via INTRAVENOUS

## 2015-11-08 MED ORDER — GABAPENTIN 600 MG PO TABS
600.0000 mg | ORAL_TABLET | Freq: Two times a day (BID) | ORAL | Status: DC
  Administered 2015-11-08 – 2015-11-10 (×6): 600 mg via ORAL
  Filled 2015-11-08 (×6): qty 1

## 2015-11-08 MED ORDER — SODIUM CHLORIDE 0.9 % WEIGHT BASED INFUSION
1.0000 mL/kg/h | INTRAVENOUS | Status: AC
  Administered 2015-11-08: 1 mL/kg/h via INTRAVENOUS

## 2015-11-08 SURGICAL SUPPLY — 14 items
CATH INFINITI 5 FR JL3.5 (CATHETERS) ×2 IMPLANT
CATH INFINITI 5FR ANG PIGTAIL (CATHETERS) ×2 IMPLANT
CATH INFINITI JR4 5F (CATHETERS) ×2 IMPLANT
CATH LAUNCHER 5F EBU3.0 (CATHETERS) ×1 IMPLANT
CATHETER LAUNCHER 5F EBU3.0 (CATHETERS) ×2
DEVICE RAD COMP TR BAND LRG (VASCULAR PRODUCTS) ×2 IMPLANT
GLIDESHEATH SLEND SS 6F .021 (SHEATH) ×2 IMPLANT
KIT HEART LEFT (KITS) ×2 IMPLANT
PACK CARDIAC CATHETERIZATION (CUSTOM PROCEDURE TRAY) ×2 IMPLANT
SYR MEDRAD MARK V 150ML (SYRINGE) ×2 IMPLANT
TRANSDUCER W/STOPCOCK (MISCELLANEOUS) ×2 IMPLANT
TUBING CIL FLEX 10 FLL-RA (TUBING) ×2 IMPLANT
WIRE HI TORQ VERSACORE-J 145CM (WIRE) ×2 IMPLANT
WIRE SAFE-T 1.5MM-J .035X260CM (WIRE) ×2 IMPLANT

## 2015-11-08 NOTE — Telephone Encounter (Signed)
Pt is hospitalized, GI bleed, cardiac issues.  Wanted Dr. Johnnye Sima to know.

## 2015-11-08 NOTE — Progress Notes (Signed)
  Received page from patient's nurse about HR staying in mid-30's - 40's. It was like this earlier in the day as well. She is asymptomatic at this time, sitting up in bed eating dinner. Denies any lightheadedness, dizziness, shortness of breath, chest pain or pre-syncopal feelings.   Will continue to monitor. If she develops symptoms, will need to consider transferring. EP to see tomorrow.  Signed, Erma Heritage, PA-C 11/08/2015, 6:59 PM Pager: [phone]

## 2015-11-08 NOTE — H&P (View-Only) (Signed)
Cardiologist:  New Reason for Consult: Bradycardia Referring Physician: Yanis Juma is an 69 y.o. female.  HPI:    The patient is a 69 yo F PMHx significant for HTN, CAD(prior MIs no records), HIV, GERD, CHF, tobacco abuse, iron deficiency anemia.  Echocardiogram 03/09/2014 revealed ejection fraction of 65-70%, mild LVH, grade 1 diastolic dysfunction, mild MR, mildly dilated left atrium, peak PA pressure 32 mmHg. Mildly elevated LVOT gradient of 2.1 mm/s which was likely related to vigorous LV function.  We saw her back in August 2015 for atypical chest pain in the setting of severe microcytic anemia.  She ruled out for MI at that time.  She has not been seen in the office since.   Patient was admitted with a chief complaint of " I've been lightheaded when I walk".  We are asked to see for bradycardia. She has a GI bleed.  Hemoglobin on admission was 7.2 and last week it was 9.1.  EKG shows sinus bradycardia with a rate of 48 bpm, LVH with repolarization abnormality.    Patient reports that for about a week now she's been nauseated, lightheaded with decreased energy levels, dyspnea, orthopnea, PND. At times she feels like she's going to pass out. She is also having palpitations which she describes as a little tickle. She was having a mildly was in the room and her pulse was significantly slow at that time. She also reports that she's having chest tightness all the time he can happen at rest and may be even worse with exertion.  She is also having upper abdominal pain and has lots of acid reflux.  The patient currently denies fever cough, congestion, hematochezia, melena, lower extremity edema, claudication.    Past Medical History  Diagnosis Date  . Hypertension   . Abnormal Pap smear   . Asthma   . Coronary artery disease   . HIV infection ([place])     diagnosed before 2008  . Varicosities   . Seizures ([place])     last sz fri nov 2  . GERD (gastroesophageal reflux disease)    previously on aciphex, discontinued november 2012  because patient asymptomatic, and concern about interference with HIV meds.  May try pepcid in the future if symptoms return  . Iron deficiency anemia     Ferritin = 2 in november 2012, started on iron supplemenation  . Arthritis   . CHF (congestive heart failure) ([place])   . Heart murmur   . Hepatitis C antibody test positive   . Complication of anesthesia     "they have a hard time bringing me back" (11/08/2015)    Past Surgical History  Procedure Laterality Date  . Abdominal hysterectomy    . Esophagogastroduodenoscopy  10/13/11    small hiatal hernia  . Colonoscopy  10/13/11    small adenoma, anal condyloma  . Colonoscopy  10/13/2011    Procedure: COLONOSCOPY;  Surgeon: Gatha Mayer, MD;  Location: [place];  Service: Endoscopy;  Laterality: N/A;  . Esophagogastroduodenoscopy  10/13/2011    Procedure: ESOPHAGOGASTRODUODENOSCOPY (EGD);  Surgeon: Gatha Mayer, MD;  Location: [facility] ENDOSCOPY;  Service: Endoscopy;  Laterality: N/A;  . Esophagogastroduodenoscopy N/A 09/14/2014    Procedure: ESOPHAGOGASTRODUODENOSCOPY (EGD);  Surgeon: Arta Silence, MD;  Location: [facility] ENDOSCOPY;  Service: Endoscopy;  Laterality: N/A;  . Colonoscopy N/A 09/14/2014    Procedure: COLONOSCOPY;  Surgeon: Arta Silence, MD;  Location: [facility] ENDOSCOPY;  Service: Endoscopy;  Laterality: N/A;  . Givens capsule study N/A  09/14/2014    Procedure: GIVENS CAPSULE STUDY;  Surgeon: Arta Silence, MD;  Location: [facility] ENDOSCOPY;  Service: Endoscopy;  Laterality: N/A;    Family History  Problem Relation Age of Onset  . Diabetes Mother   . Ovarian cancer Sister   . Cancer Sister     ovarian and colon  . Colon cancer Neg Hx     Social History:  reports that she has been smoking Cigarettes.  She has a 37.5 pack-year smoking history. She has never used smokeless tobacco. She reports that she drinks about 6.0 oz of alcohol per week. She reports that she does not use illicit  drugs.  Allergies:  Allergies  Allergen Reactions  . Penicillins     REACTION: GOES INTO SHOCK & THEN PASSES OU Has patient had a PCN reaction causing immediate rash, facial/tongue/throat swelling, SOB or lightheadedness with hypotension: NO Has patient had a PCN reaction causing severe rash involving mucus membranes or skin necrosis: NO Has patient had a PCN reaction that required hospitalization NO Has patient had a PCN reaction occurring within the last 10 years: NO If all of the above answers are "NO", then may proceed with Cephalosporin use.   . Avelox [Moxifloxacin Hcl In Nacl] Itching and Rash    Medications:  Scheduled Meds: . dolutegravir  50 mg Oral Daily  . emtricitabine-tenofovir AF  1 tablet Oral Daily  . feeding supplement (ENSURE ENLIVE)  237 mL Oral BID BM  . [START ON 11/09/2015] ferrous sulfate  325 mg Oral Q breakfast  . gabapentin  600 mg Oral BID  . lisinopril  10 mg Oral Daily  . mometasone-formoterol  2 puff Inhalation BID  . pantoprazole (PROTONIX) IV  40 mg Intravenous Q12H  . sucralfate  1 g Oral QID   Continuous Infusions:  PRN Meds:.acetaminophen **OR** acetaminophen, acyclovir, albuterol, baclofen, diazepam, loratadine   Results for orders placed or performed during the hospital encounter of 11/07/15 (from the past 48 hour(s))  Lipase, blood     Status: Abnormal   Collection Time: 11/07/15  3:38 PM  Result Value Ref Range   Lipase 67 (H) 11 - 51 U/L  Comprehensive metabolic panel     Status: Abnormal   Collection Time: 11/07/15  3:38 PM  Result Value Ref Range   Sodium 138 135 - 145 mmol/L   Potassium 3.7 3.5 - 5.1 mmol/L   Chloride 105 101 - 111 mmol/L   CO2 28 22 - 32 mmol/L   Glucose, Bld 109 (H) 65 - 99 mg/dL   BUN 25 (H) 6 - 20 mg/dL   Creatinine, Ser 1.26 (H) 0.44 - 1.00 mg/dL   Calcium 8.7 (L) 8.9 - 10.3 mg/dL   Total Protein 7.1 6.5 - 8.1 g/dL   Albumin 3.4 (L) 3.5 - 5.0 g/dL   AST 19 15 - 41 U/L   ALT 11 (L) 14 - 54 U/L    Alkaline Phosphatase 74 38 - 126 U/L   Total Bilirubin 0.4 0.3 - 1.2 mg/dL   GFR calc non Af Amer 42 (L) >60 mL/min   GFR calc Af Amer 48 (L) >60 mL/min    Comment: (NOTE) The eGFR has been calculated using the CKD EPI equation. This calculation has not been validated in all clinical situations. eGFR's persistently <60 mL/min signify possible Chronic Kidney Disease.    Anion gap 5 5 - 15  CBC     Status: Abnormal   Collection Time: 11/07/15  3:38 PM  Result Value Ref  Range   WBC 5.0 4.0 - 10.5 K/uL   RBC 2.64 (L) 3.87 - 5.11 MIL/uL   Hemoglobin 7.2 (L) 12.0 - 15.0 g/dL   HCT 23.7 (L) 36.0 - 46.0 %   MCV 89.8 78.0 - 100.0 fL   MCH 27.3 26.0 - 34.0 pg   MCHC 30.4 30.0 - 36.0 g/dL   RDW 16.4 (H) 11.5 - 15.5 %   Platelets 283 150 - 400 K/uL  I-stat troponin, ED (not at [facility], [facility])     Status: None   Collection Time: 11/07/15  4:05 PM  Result Value Ref Range   Troponin i, poc 0.00 0.00 - 0.08 ng/mL   Comment 3            Comment: Due to the release kinetics of cTnI, a negative result within the first hours of the onset of symptoms does not rule out myocardial infarction with certainty. If myocardial infarction is still suspected, repeat the test at appropriate intervals.   POC occult blood, ED Provider will collect     Status: Abnormal   Collection Time: 11/07/15  8:48 PM  Result Value Ref Range   Fecal Occult Bld POSITIVE (A) NEGATIVE  CBC with Differential     Status: Abnormal   Collection Time: 11/07/15  9:56 PM  Result Value Ref Range   WBC 5.0 4.0 - 10.5 K/uL   RBC 2.78 (L) 3.87 - 5.11 MIL/uL   Hemoglobin 7.5 (L) 12.0 - 15.0 g/dL   HCT 25.1 (L) 36.0 - 46.0 %   MCV 90.3 78.0 - 100.0 fL   MCH 27.0 26.0 - 34.0 pg   MCHC 29.9 (L) 30.0 - 36.0 g/dL   RDW 16.2 (H) 11.5 - 15.5 %   Platelets 270 150 - 400 K/uL   Neutrophils Relative % 61 %   Lymphocytes Relative 31 %   Monocytes Relative 6 %   Eosinophils Relative 2 %   Basophils Relative 0 %   Neutro Abs 3.0 1.7 - 7.7  K/uL   Lymphs Abs 1.6 0.7 - 4.0 K/uL   Monocytes Absolute 0.3 0.1 - 1.0 K/uL   Eosinophils Absolute 0.1 0.0 - 0.7 K/uL   Basophils Absolute 0.0 0.0 - 0.1 K/uL   RBC Morphology SLIGHT POLYCHROMASIA NOTED   Type and screen [facility]     Status: None (Preliminary result)   Collection Time: 11/07/15  9:56 PM  Result Value Ref Range   ABO/RH(D) O POS    Antibody Screen NEG    Sample Expiration 11/10/2015    Unit Number [id number]    Blood Component Type RED CELLS,LR    Unit division 00    Status of Unit ISSUED    Transfusion Status OK TO TRANSFUSE    Crossmatch Result Compatible    Unit Number [id number]    Blood Component Type RBC LR PHER2    Unit division 00    Status of Unit ISSUED    Transfusion Status OK TO TRANSFUSE    Crossmatch Result Compatible   Vitamin B12     Status: None   Collection Time: 11/07/15  9:56 PM  Result Value Ref Range   Vitamin B-12 333 180 - 914 pg/mL    Comment: (NOTE) This assay is not validated for testing neonatal or myeloproliferative syndrome specimens for Vitamin B12 levels.   Folate     Status: None   Collection Time: 11/07/15  9:56 PM  Result Value Ref Range   Folate 11.3 >5.9 ng/mL  Iron and TIBC     Status: Abnormal   Collection Time: 11/07/15  9:56 PM  Result Value Ref Range   Iron 10 (L) 28 - 170 ug/dL   TIBC 566 (H) 250 - 450 ug/dL   Saturation Ratios 2 (L) 10.4 - 31.8 %   UIBC 556 ug/dL  Ferritin     Status: Abnormal   Collection Time: 11/07/15  9:56 PM  Result Value Ref Range   Ferritin 3 (L) 11 - 307 ng/mL  Reticulocytes     Status: Abnormal   Collection Time: 11/07/15  9:56 PM  Result Value Ref Range   Retic Ct Pct 2.1 0.4 - 3.1 %   RBC. 2.78 (L) 3.87 - 5.11 MIL/uL   Retic Count, Manual 58.4 19.0 - 186.0 K/uL  Prepare RBC     Status: None   Collection Time: 11/07/15 10:00 PM  Result Value Ref Range   Order Confirmation ORDER PROCESSED BY BLOOD BANK   I-stat troponin, ED     Status: None    Collection Time: 11/07/15 10:02 PM  Result Value Ref Range   Troponin i, poc 0.00 0.00 - 0.08 ng/mL   Comment 3            Comment: Due to the release kinetics of cTnI, a negative result within the first hours of the onset of symptoms does not rule out myocardial infarction with certainty. If myocardial infarction is still suspected, repeat the test at appropriate intervals.   I-stat Chem 8, ED     Status: Abnormal   Collection Time: 11/07/15 10:04 PM  Result Value Ref Range   Sodium 140 135 - 145 mmol/L   Potassium 3.6 3.5 - 5.1 mmol/L   Chloride 106 101 - 111 mmol/L   BUN 28 (H) 6 - 20 mg/dL   Creatinine, Ser 1.10 (H) 0.44 - 1.00 mg/dL   Glucose, Bld 125 (H) 65 - 99 mg/dL   Calcium, Ion 1.05 (L) 1.13 - 1.30 mmol/L   TCO2 23 0 - 100 mmol/L   Hemoglobin 8.5 (L) 12.0 - 15.0 g/dL   HCT 25.0 (L) 36.0 - 46.0 %  Urinalysis, Routine w reflex microscopic (not at [facility])     Status: Abnormal   Collection Time: 11/07/15 11:05 PM  Result Value Ref Range   Color, Urine YELLOW YELLOW   APPearance CLEAR CLEAR   Specific Gravity, Urine 1.015 1.005 - 1.030   pH 6.5 5.0 - 8.0   Glucose, UA NEGATIVE NEGATIVE mg/dL   Hgb urine dipstick NEGATIVE NEGATIVE   Bilirubin Urine NEGATIVE NEGATIVE   Ketones, ur NEGATIVE NEGATIVE mg/dL   Protein, ur NEGATIVE NEGATIVE mg/dL   Nitrite NEGATIVE NEGATIVE   Leukocytes, UA TRACE (A) NEGATIVE  Urine microscopic-add on     Status: Abnormal   Collection Time: 11/07/15 11:05 PM  Result Value Ref Range   Squamous Epithelial / LPF 0-5 (A) NONE SEEN   WBC, UA 0-5 0 - 5 WBC/hpf   RBC / HPF NONE SEEN 0 - 5 RBC/hpf   Bacteria, UA RARE (A) NONE SEEN   Casts HYALINE CASTS (A) NEGATIVE  CBC     Status: Abnormal   Collection Time: 11/08/15  2:52 AM  Result Value Ref Range   WBC 4.6 4.0 - 10.5 K/uL   RBC 2.78 (L) 3.87 - 5.11 MIL/uL   Hemoglobin 7.8 (L) 12.0 - 15.0 g/dL   HCT 25.2 (L) 36.0 - 46.0 %   MCV 90.6 78.0 - 100.0 fL  MCH 28.1 26.0 - 34.0 pg   MCHC 31.0  30.0 - 36.0 g/dL   RDW 16.3 (H) 11.5 - 15.5 %   Platelets 239 150 - 400 K/uL    Dg Chest Port 1 View  11/07/2015  CLINICAL DATA:  Low hemoglobin and fatigue weakness 1 week EXAM: PORTABLE CHEST 1 VIEW COMPARISON:  09/12/2014 FINDINGS: Stable mild to moderate cardiac enlargement. Vascular pattern normal. No consolidation or effusion. IMPRESSION: Stable cardiac enlargement with no acute findings Electronically Signed   By: Skipper Cliche M.D.   On: 11/07/2015 21:29    Review of Systems  Constitutional: Positive for malaise/fatigue. Negative for fever and diaphoresis.  HENT: Negative for congestion and sore throat.   Respiratory: Positive for shortness of breath.   Cardiovascular: Positive for chest pain, palpitations, orthopnea and PND. Negative for leg swelling.  Gastrointestinal: Positive for nausea and abdominal pain. Negative for vomiting, blood in stool and melena.  Genitourinary: Negative for hematuria.  Musculoskeletal: Positive for neck pain.  Neurological: Positive for dizziness and weakness.  All other systems reviewed and are negative.  Blood pressure 171/63, pulse 46, temperature 98.7 F (37.1 C), temperature source Oral, resp. rate 20, height 5' 2"  (1.575 m), weight 133 lb 6.4 oz (60.51 kg), SpO2 97 %. Physical Exam  Nursing note and vitals reviewed. Constitutional: She is oriented to person, place, and time. She appears well-developed and well-nourished. No distress.  HENT:  Head: Normocephalic and atraumatic.  Eyes: EOM are normal. Pupils are equal, round, and reactive to light. No scleral icterus.  Neck: Normal range of motion. Neck supple. No JVD present.  Cardiovascular: Regular rhythm, S1 normal and S2 normal.  Bradycardia present.   Murmur heard.  Systolic murmur is present with a grade of 2/6  Pulses:      Radial pulses are 2+ on the right side, and 2+ on the left side.       Dorsalis pedis pulses are 2+ on the right side.  Respiratory: Effort normal and breath  sounds normal. She has no wheezes. She has no rales.  GI: Soft. There is tenderness (across the epigastric area).  Musculoskeletal: She exhibits no edema.  Lymphadenopathy:    She has no cervical adenopathy.  Neurological: She is alert and oriented to person, place, and time. She exhibits normal muscle tone.  Skin: Skin is warm and dry.  Psychiatric: She has a normal mood and affect.    Assessment/Plan: Principal Problem:   GI bleed Active Problems:   Human immunodeficiency virus (HIV) disease ([facility])   TOBACCO ABUSE   Gastroesophageal reflux disease   Essential hypertension, benign   Iron deficiency anemia   Anal condylomata   Hepatitis C   Anxiety   Asthma, chronic   Bradycardia   Junctional bradycardia   Unstable angina   69 yo F PMHx significant for HTN, CAD(prior MIs no records), HIV, GERD, CHF, tobacco abuse, iron deficiency anemia.  Echocardiogram 03/09/2014 revealed ejection fraction of 65-70%, mild LVH, grade 1 diastolic dysfunction, mild MR, mildly dilated left atrium, peak PA pressure 32 mmHg. Mildly elevated LVOT gradient of 2.1 mm/s which was likely related to vigorous LV function. She presented with weakness fatigue and GI bleed.  Hgb 7.1.    Patient is having intermittent junctional and sinus bradycardia into the low 30's and she is feeling it.  Unfortunately she is not on any beta blockers that we can stop. She reports having frequent chest pain which appears to be worse with exertion but does occur  at rest.  She needs a left heart catheterization to rule out ischemic cause for her bradycardia.  Before considering a pacemaker. It is unfortunate that she is having a GI bleed which will make it difficult to stent an add antiplatelet therapy.  External PM pads applied.  What is reassuring is that she's had 2 negative troponin POC's.  Echo today. NPO now.   Tarri Fuller, [place] 11/08/2015, 9:48 AM   The patient was seen, examined and discussed with Tarri Fuller, PA-C and I  agree with the above.   69 year old female with prior h/o ? CAD ( no cath here), HIV, chronic diastolic CHF, who presented with chest pain, fatigue and presyncope, worsening DOE after few steps with presyncopal episodes. Telemetry shows sinus bradycardia with escape junctional bradycardia with HR down to 30". We will plan for a cath, if normal consult EP for a PM placement. If abnormal cath the situation would be complicated as she has worsening anemia and might be cleared by GI before stent placement.   Dorothy Spark 11/08/2015

## 2015-11-08 NOTE — Interval H&P Note (Signed)
Cath Lab Visit (complete for each Cath Lab visit)  Clinical Evaluation Leading to the Procedure:   ACS: Yes.    Non-ACS:    Anginal Classification: CCS IV  Anti-ischemic medical therapy: Minimal Therapy (1 class of medications)  Non-Invasive Test Results: No non-invasive testing performed  Prior CABG: No previous CABG   Planned diagnostic cath.  GI bleeding.   History and Physical Interval Note:  11/08/2015 4:03 PM  Nicole Mccormick  has presented today for surgery, with the diagnosis of cp, bradycardia  The various methods of treatment have been discussed with the patient and family. After consideration of risks, benefits and other options for treatment, the patient has consented to  Procedure(s): Left Heart Cath and Coronary Angiography (N/A) as a surgical intervention .  The patient's history has been reviewed, patient examined, no change in status, stable for surgery.  I have reviewed the patient's chart and labs.  Questions were answered to the patient's satisfaction.     VARANASI,JAYADEEP S.

## 2015-11-08 NOTE — Progress Notes (Signed)
Subjective: Patient was bradycardic this morning on telemetry with HR in the 30s, no signs of dropped beats or AV block, but rhythm was irregular with pauses. She has chronic bradycardia as per records from [facility] and cardiology. She is not on a beta blocker.  HR was in the 50s on tele when we saw the patient. She reported feeling dizzy/ lightheaded and having palpitations and dyspnea on exertion. However, she also has symptomatic anemia due to GI bleed which could account for some of her symptoms. She denied having any CP or SOB at present.   Objective: Vital signs in last 24 hours: Filed Vitals:   11/08/15 0518 11/08/15 0552 11/08/15 0729 11/08/15 1220  BP: 151/51 157/53 171/63 148/47  Pulse: 51 52 46 56  Temp: 98.4 F (36.9 C) 98 F (36.7 C) 98.7 F (37.1 C) 98.9 F (37.2 C)  TempSrc: Oral Oral Oral Oral  Resp: 18 20 20 18   Height:      Weight:      SpO2: 96% 98% 97% 99%   Weight change:   Intake/Output Summary (Last 24 hours) at 11/08/15 1518 Last data filed at 11/08/15 1221  Gross per 24 hour  Intake   1246 ml  Output    950 ml  Net    296 ml   Physical Exam: General: AAOx3, NAD Cardiac: bradycardic with 2/6 systolic murmur loudest over the RUSB, normal S1/S2 Pulm: breathing well, clear to auscultation bilaterally Abd: bowel sounds normal, soft, nondistended, tender in the epigastric region even with very light palpation. No rebound or rigidity.  Ext: warm and well perfused, without pedal edema  Lab Results: Basic Metabolic Panel:  Recent Labs Lab 11/07/15 1538 11/07/15 2204 11/08/15 1140  NA 138 140  --   K 3.7 3.6  --   CL 105 106  --   CO2 28  --   --   GLUCOSE 109* 125*  --   BUN 25* 28*  --   CREATININE 1.26* 1.10*  --   CALCIUM 8.7*  --   --   MG  --   --  2.4   Liver Function Tests:  Recent Labs Lab 11/07/15 1538  AST 19  ALT 11*  ALKPHOS 74  BILITOT 0.4  PROT 7.1  ALBUMIN 3.4*    Recent Labs Lab 11/07/15 1538  LIPASE 67*    CBC:  Recent Labs Lab 11/07/15 2156  11/08/15 0252 11/08/15 1140  WBC 5.0  --  4.6 5.7  NEUTROABS 3.0  --   --   --   HGB 7.5*  < > 7.8* 9.9*  HCT 25.1*  < > 25.2* 31.2*  MCV 90.3  --  90.6 89.4  PLT 270  --  239 207  < > = values in this interval not displayed.  Anemia Panel:  Recent Labs Lab 11/07/15 2156  VITAMINB12 333  FOLATE 11.3  FERRITIN 3*  TIBC 566*  IRON 10*  RETICCTPCT 2.1   Urine Drug Screen: Drugs of Abuse     Component Value Date/Time   LABOPIA POSITIVE* 03/09/2014 1600   LABOPIA NEGATIVE 12/26/2010 0137   COCAINSCRNUR NONE DETECTED 03/09/2014 1600   COCAINSCRNUR NEGATIVE 12/26/2010 0137   LABBENZ NONE DETECTED 03/09/2014 1600   LABBENZ NEGATIVE 12/26/2010 0137   AMPHETMU NONE DETECTED 03/09/2014 1600   AMPHETMU NEGATIVE 12/26/2010 0137   THCU NONE DETECTED 03/09/2014 1600   LABBARB NONE DETECTED 03/09/2014 1600    Urinalysis:  Recent Labs Lab 11/07/15 [place]  YELLOW  LABSPEC 1.015  PHURINE 6.5  GLUCOSEU NEGATIVE  HGBUR NEGATIVE  BILIRUBINUR NEGATIVE  KETONESUR NEGATIVE  PROTEINUR NEGATIVE  NITRITE NEGATIVE  LEUKOCYTESUR TRACE*   Micro Results: No results found for this or any previous visit (from the past 240 hour(s)). Studies/Results: Dg Chest Port 1 View  11/07/2015  CLINICAL DATA:  Low hemoglobin and fatigue weakness 1 week EXAM: PORTABLE CHEST 1 VIEW COMPARISON:  09/12/2014 FINDINGS: Stable mild to moderate cardiac enlargement. Vascular pattern normal. No consolidation or effusion. IMPRESSION: Stable cardiac enlargement with no acute findings Electronically Signed   By: Skipper Cliche M.D.   On: 11/07/2015 21:29   Medications: I have reviewed the patient's current medications. Scheduled Meds: . aspirin  81 mg Oral Pre-Cath  . dolutegravir  50 mg Oral Daily  . emtricitabine-tenofovir AF  1 tablet Oral Daily  . feeding supplement (ENSURE ENLIVE)  237 mL Oral BID BM  . [START ON 11/09/2015] ferrous sulfate  325 mg  Oral Q breakfast  . gabapentin  600 mg Oral BID  . lisinopril  10 mg Oral Daily  . mometasone-formoterol  2 puff Inhalation BID  . pantoprazole (PROTONIX) IV  40 mg Intravenous Q12H  . sodium chloride  3 mL Intravenous Q12H  . sucralfate  1 g Oral QID   Continuous Infusions: . sodium chloride     PRN Meds:.sodium chloride, acetaminophen **OR** acetaminophen, acyclovir, albuterol, baclofen, diazepam, loratadine, sodium chloride Assessment/Plan: Principal Problem:   GI bleed Active Problems:   Human immunodeficiency virus (HIV) disease (HCC)   TOBACCO ABUSE   Gastroesophageal reflux disease   Essential hypertension, benign   Iron deficiency anemia   Anal condylomata   Hepatitis C   Anxiety   Asthma, chronic  Nicole Mccormick is a 69 year old African American lady with a history of a gastrointestinal bleed suspected to be from arteriovenous malformations, gastroesophageal reflux disease, well-controlled HIV, hypertension, tobacco abuse, coronary artery disease, and hepatitis C antibody positive presenting with symptomatic anemia, found to have a hemoglobin of 7.2 with a positive fecal occult blood test suggestive of another gastrointestinal bleed.   She has been compliant with taking her pantoprazole and oral iron, and has not been taking besides aspirin 81mg  daily. We'll continue her on IV pantoprazole, hold her aspirin and anti-hypertensives, and the emergency physician has consulted gastroenterology for them to evaluate her tomorrow.  IDA 2/2 Gastrointestinal bleed: Per above, possibly due to arteriovenous malformations. Arteriovenous malformations were visualized last year but she never followed-up for push versus double balloon enteroscopy. Iron studies showing Iron 10, TIBC 566, and Ferritin 3. GI will not be doing any further inpatient workup because patient had an extensive workup a year ago. They are recommending outpatient workup.  -Gastroenterology consulted; thank you for your  help -Transfused 2 units PRBCs. Hgb 9.9 now.  -Pantoprazole 40mg  IV twice daily -Holding aspirin -Outpatient GI f/u  Coronary artery disease: She says she had 2 silent myocardial infarctions in the past without stenting or CABG and has stable angina. We don't see a cath in her records but an echo in April 2015 showed an ejection fraction of 60%. Her EKG showed sinus bradycardia but is non-ischemic and a troponin was normal. Patient was bradycardic this morning on telemetry with HR in the 30s, no signs of dropped beats or AV block, but rhythm was irregular with pauses. She has chronic bradycardia as per records from [facility] and cardiology. She is not on a beta blocker.  HR was in the 50s  on tele when we saw the patient. She reported feeling dizzy/ lightheaded and having palpitations and dyspnea on exertion. However, she also has symptomatic anemia due to GI bleed which could account for some of her symptoms.She was taking aspirin before which we'll hold. -Holding aspirin 81mg  daily -cardio planning for cath today, if normal will consult EP for pacemaker placement  -Echo pending   Human immunodeficiency virus: Her last CD4 was 550 with an undetectable viral load. She's compliant with her anti-retrovirals. -Continue Tivicay and Descovy  Chronic back pain: Not an acute issue. -Continue acetaminophen 650mg  every 6 hours for pain -Continue gabapentin 600mg  twice daily -Continue baclofen 10mg  three times daily as needed for muscle spasms  Hypertension: BP high this morning with systolic in the [id number]. -Holding imdur 30mg  and chlorthalidone 25mg  -restarted lisinopril 40mg   Anxiety: Her mood is normal. -Continue diazepam 2mg  every 6 hours as needed for anxiety  Chronic obstructive pulmonary disease: Stable, not on home oxygen. -Continue mometasone inhaler twice daily -Continue albuterol nebulizers every 6 hours as needed for wheezing  Restless leg syndrome: Likely worsened by her anemia.    Dispo:  Disposition is deferred at this time, awaiting improvement of current medical problems.  Anticipated discharge in approximately 1-2 day(s).   The patient does have a current PCP Shela Leff, MD) and does need an [facility] hospital follow-up appointment after discharge.  The patient does not have transportation limitations that hinder transportation to clinic appointments.  .Services Needed at time of discharge: Y = Yes, Blank = No PT:   OT:   RN:   Equipment:   Other:       Shela Leff, MD 11/08/2015, 3:18 PM

## 2015-11-08 NOTE — Progress Notes (Signed)
Patient transferred to room 3E05 from the ED via stretcher. Oriented patient to room equiopment, and the heart failure floor. VSS. Patient able to walk from stretcher to bed in room. CMT called by the charge nurse to confirm placement of telemetry and Telemetry box number. Patient currently has one unit of packed red blood cells infusing and shows no signs or symptoms of distress or reactions. Will continue to monitor patient to end odf shfit.

## 2015-11-08 NOTE — Progress Notes (Signed)
Patient HR continues to drop to 35 to 40 ,pacer pads in place on patient. On call cardiology paged .

## 2015-11-08 NOTE — Consult Note (Signed)
Subjective:   HPI  The patient is a 69 year old female with multiple medical problems who we are asked to see in regards to iron deficiency anemia. She was feeling weak and tired and came to the hospital and was admitted with a low hemoglobin and hematocrit. She underwent extensive evaluation a year ago for heme positive stool and iron deficiency anemia. She had an EGD, colonoscopy, capsule endoscopy and it was determined that her anemia was most likely secondary to small bowel AVMs. She has been on iron, her stool is usually black in color. She has not seen any bleeding. Her stool is heme-positive. A note from last October 2015 from Dr. Paulita Fujita when she was in the hospital suggested that she should follow-up in our office for consideration of further investigation which would possibly include double balloon enteroscopy at [facility]. The patient never followed up, and does not recall that she was told about this.     Past Medical History  Diagnosis Date  . Hypertension   . Abnormal Pap smear   . Asthma   . Coronary artery disease   . HIV infection ([place])     diagnosed before 2008  . Varicosities   . Seizures ([place])     last sz fri nov 2  . GERD (gastroesophageal reflux disease)     previously on aciphex, discontinued november 2012  because patient asymptomatic, and concern about interference with HIV meds.  May try pepcid in the future if symptoms return  . Iron deficiency anemia     Ferritin = 2 in november 2012, started on iron supplemenation  . Arthritis   . CHF (congestive heart failure) ([place])   . Heart murmur   . Hepatitis C antibody test positive   . Complication of anesthesia     "they have a hard time bringing me back" (11/08/2015)   Past Surgical History  Procedure Laterality Date  . Abdominal hysterectomy    . Esophagogastroduodenoscopy  10/13/11    small hiatal hernia  . Colonoscopy  10/13/11    small adenoma, anal condyloma  . Colonoscopy  10/13/2011    Procedure:  COLONOSCOPY;  Surgeon: Gatha Mayer, MD;  Location: [facility];  Service: Endoscopy;  Laterality: N/A;  . Esophagogastroduodenoscopy  10/13/2011    Procedure: ESOPHAGOGASTRODUODENOSCOPY (EGD);  Surgeon: Gatha Mayer, MD;  Location: [facility] ENDOSCOPY;  Service: Endoscopy;  Laterality: N/A;  . Esophagogastroduodenoscopy N/A 09/14/2014    Procedure: ESOPHAGOGASTRODUODENOSCOPY (EGD);  Surgeon: Arta Silence, MD;  Location: [facility] ENDOSCOPY;  Service: Endoscopy;  Laterality: N/A;  . Colonoscopy N/A 09/14/2014    Procedure: COLONOSCOPY;  Surgeon: Arta Silence, MD;  Location: [facility] ENDOSCOPY;  Service: Endoscopy;  Laterality: N/A;  . Givens capsule study N/A 09/14/2014    Procedure: GIVENS CAPSULE STUDY;  Surgeon: Arta Silence, MD;  Location: [facility] ENDOSCOPY;  Service: Endoscopy;  Laterality: N/A;   Social History   Social History  . Marital Status: Married    Spouse Name: N/A  . Number of Children: N/A  . Years of Education: N/A   Occupational History  . Not on file.   Social History Main Topics  . Smoking status: Current Every Day Smoker -- 0.75 packs/day for 50 years    Types: Cigarettes  . Smokeless tobacco: Never Used  . Alcohol Use: 6.0 oz/week    10 Glasses of wine per week  . Drug Use: No  . Sexual Activity: Not on file     Comment: pt. given condoms   Other Topics Concern  .  Not on file   Social History Narrative   family history includes Cancer in her sister; Diabetes in her mother; Ovarian cancer in her sister. There is no history of Colon cancer.  Current facility-administered medications:  .  0.9 %  sodium chloride infusion, 250 mL, Intravenous, PRN, Brett Canales, PA-C .  0.9 %  sodium chloride infusion, , Intravenous, Continuous, Bryan W Hager, PA-C .  acetaminophen (TYLENOL) tablet 650 mg, 650 mg, Oral, Q6H PRN **OR** acetaminophen (TYLENOL) suppository 650 mg, 650 mg, Rectal, Q6H PRN, Jones Bales, MD .  acyclovir (ZOVIRAX) tablet 400 mg, 400 mg, Oral, Daily PRN,  Jones Bales, MD .  albuterol (PROVENTIL) (2.5 MG/3ML) 0.083% nebulizer solution 2.5 mg, 2.5 mg, Nebulization, Q6H PRN, Jones Bales, MD .  aspirin chewable tablet 81 mg, 81 mg, Oral, Pre-Cath, Bryan W Hager, PA-C .  baclofen (LIORESAL) tablet 10 mg, 10 mg, Oral, TID PRN, Jones Bales, MD .  diazepam (VALIUM) tablet 2 mg, 2 mg, Oral, Q8H PRN, Jones Bales, MD .  dolutegravir (TIVICAY) tablet 50 mg, 50 mg, Oral, Daily, Jones Bales, MD, 50 mg at 11/08/15 1000 .  emtricitabine-tenofovir AF (DESCOVY) 200-25 MG per tablet 1 tablet, 1 tablet, Oral, Daily, Jones Bales, MD, 1 tablet at 11/08/15 1000 .  feeding supplement (ENSURE ENLIVE) (ENSURE ENLIVE) liquid 237 mL, 237 mL, Oral, BID BM, Annia Belt, MD, 237 mL at 11/08/15 1000 .  [START ON 11/09/2015] ferrous sulfate tablet 325 mg, 325 mg, Oral, Q breakfast, Jones Bales, MD .  gabapentin (NEURONTIN) tablet 600 mg, 600 mg, Oral, BID, Jones Bales, MD, 600 mg at 11/08/15 1000 .  lisinopril (PRINIVIL,ZESTRIL) tablet 10 mg, 10 mg, Oral, Daily, Alexa Sherral Hammers, MD, 10 mg at 11/08/15 1000 .  loratadine (CLARITIN) tablet 10 mg, 10 mg, Oral, Daily PRN, Jones Bales, MD .  mometasone-formoterol (DULERA) 200-5 MCG/ACT inhaler 2 puff, 2 puff, Inhalation, BID, Jones Bales, MD, 2 puff at 11/08/15 0930 .  pantoprazole (PROTONIX) injection 40 mg, 40 mg, Intravenous, Q12H, Jones Bales, MD, 40 mg at 11/08/15 1000 .  sodium chloride 0.9 % injection 3 mL, 3 mL, Intravenous, Q12H, Bryan W Hager, PA-C .  sodium chloride 0.9 % injection 3 mL, 3 mL, Intravenous, PRN, Einar Pheasant Hager, PA-C .  sucralfate (CARAFATE) tablet 1 g, 1 g, Oral, QID, Jones Bales, MD, 1 g at 11/08/15 1000 Allergies  Allergen Reactions  . Penicillins     REACTION: GOES INTO SHOCK & THEN PASSES OU Has patient had a PCN reaction causing immediate rash, facial/tongue/throat swelling, SOB or lightheadedness with hypotension: NO Has patient had a  PCN reaction causing severe rash involving mucus membranes or skin necrosis: NO Has patient had a PCN reaction that required hospitalization NO Has patient had a PCN reaction occurring within the last 10 years: NO If all of the above answers are "NO", then may proceed with Cephalosporin use.   . Avelox [Moxifloxacin Hcl In Nacl] Itching and Rash     Objective:     BP 148/47 mmHg  Pulse 56  Temp(Src) 98.9 F (37.2 C) (Oral)  Resp 18  Ht 5\' 2"  (1.575 m)  Wt 60.51 kg (133 lb 6.4 oz)  BMI 24.39 kg/m2  SpO2 99%  She is in no distress  Nonicteric  Heart regular rhythm, bradycardic, no murmurs  Lungs clear  Abdomen soft nontender  Laboratory No components found for: D1    Assessment:  Recurrent iron deficiency anemia. Prior evaluation was done last year with EGD, colonoscopy, and capsule endoscopy. It was deemed that her bleeding was secondary to small bowel AVMs. Consideration was given to refer to [facility] for double balloon enteroscopy, but the patient never followed up for this. She states she was unaware of that recommendation.      Plan:     At this point I would recommend that once her hemoglobin and hematocrit are stable that she should be referred as an outpatient to [facility] to Dr. Olegario Messier who does double balloon enteroscopy. I do not see any value in putting her through another EGD, colonoscopy, or capsule endoscopy at this time. We will sign off. Call us if needed. Lab Results  Component Value Date   HGB 9.9* 11/08/2015   HGB 7.8* 11/08/2015   HGB 8.5* 11/07/2015   HCT 31.2* 11/08/2015   HCT 25.2* 11/08/2015   HCT 25.0* 11/07/2015   ALKPHOS 74 11/07/2015   ALKPHOS 75 10/30/2015   ALKPHOS 73 01/26/2015   AST 19 11/07/2015   AST 16 10/30/2015   AST 17 01/26/2015   ALT 11* 11/07/2015   ALT 9 10/30/2015   ALT 11 01/26/2015

## 2015-11-08 NOTE — Progress Notes (Signed)
Notified Dr.Richardson of patient having an elevated blood pressure - 171/63 after second unit of blood transfused. Patient is asymptomatic and complains of no pain or dyspnea. Patient also request for a diet so that she can eat. Orders given for diet and was advised that elevated blood pressure will be addressed. Reported off to day nurse and nurse signing off at this time.

## 2015-11-08 NOTE — Progress Notes (Addendum)
Dr Marvel Plan called patient dropping to 30 HR . Patient  in sinus brady 30 to 45 hr. EKG obtained. 171/63 R 18. Will continue to monitor patient.

## 2015-11-08 NOTE — Consult Note (Signed)
Cardiologist:  New Reason for Consult: Bradycardia Referring Physician: Jakyra Kenealy is an 69 y.o. female.  HPI:    The patient is a 69 yo F PMHx significant for HTN, CAD(prior MIs no records), HIV, GERD, CHF, tobacco abuse, iron deficiency anemia.  Echocardiogram 03/09/2014 revealed ejection fraction of 65-70%, mild LVH, grade 1 diastolic dysfunction, mild MR, mildly dilated left atrium, peak PA pressure 32 mmHg. Mildly elevated LVOT gradient of 2.1 mm/s which was likely related to vigorous LV function.  We saw her back in August 2015 for atypical chest pain in the setting of severe microcytic anemia.  She ruled out for MI at that time.  She has not been seen in the office since.   Patient was admitted with a chief complaint of " I've been lightheaded when I walk".  We are asked to see for bradycardia. She has a GI bleed.  Hemoglobin on admission was 7.2 and last week it was 9.1.  EKG shows sinus bradycardia with a rate of 48 bpm, LVH with repolarization abnormality.    Patient reports that for about a week now she's been nauseated, lightheaded with decreased energy levels, dyspnea, orthopnea, PND. At times she feels like she's going to pass out. She is also having palpitations which she describes as a little tickle. She was having a mildly was in the room and her pulse was significantly slow at that time. She also reports that she's having chest tightness all the time he can happen at rest and may be even worse with exertion.  She is also having upper abdominal pain and has lots of acid reflux.  The patient currently denies fever cough, congestion, hematochezia, melena, lower extremity edema, claudication.    Past Medical History  Diagnosis Date  . Hypertension   . Abnormal Pap smear   . Asthma   . Coronary artery disease   . HIV infection ([place])     diagnosed before 2008  . Varicosities   . Seizures ([place])     last sz fri nov 2  . GERD (gastroesophageal reflux disease)      previously on aciphex, discontinued november 2012  because patient asymptomatic, and concern about interference with HIV meds.  May try pepcid in the future if symptoms return  . Iron deficiency anemia     Ferritin = 2 in november 2012, started on iron supplemenation  . Arthritis   . CHF (congestive heart failure) ([place])   . Heart murmur   . Hepatitis C antibody test positive   . Complication of anesthesia     "they have a hard time bringing me back" (11/08/2015)    Past Surgical History  Procedure Laterality Date  . Abdominal hysterectomy    . Esophagogastroduodenoscopy  10/13/11    small hiatal hernia  . Colonoscopy  10/13/11    small adenoma, anal condyloma  . Colonoscopy  10/13/2011    Procedure: COLONOSCOPY;  Surgeon: Gatha Mayer, MD;  Location: [place];  Service: Endoscopy;  Laterality: N/A;  . Esophagogastroduodenoscopy  10/13/2011    Procedure: ESOPHAGOGASTRODUODENOSCOPY (EGD);  Surgeon: Gatha Mayer, MD;  Location: [facility] ENDOSCOPY;  Service: Endoscopy;  Laterality: N/A;  . Esophagogastroduodenoscopy N/A 09/14/2014    Procedure: ESOPHAGOGASTRODUODENOSCOPY (EGD);  Surgeon: Arta Silence, MD;  Location: [facility] ENDOSCOPY;  Service: Endoscopy;  Laterality: N/A;  . Colonoscopy N/A 09/14/2014    Procedure: COLONOSCOPY;  Surgeon: Arta Silence, MD;  Location: [facility] ENDOSCOPY;  Service: Endoscopy;  Laterality: N/A;  . Givens capsule  study N/A 09/14/2014    Procedure: GIVENS CAPSULE STUDY;  Surgeon: Arta Silence, MD;  Location: [facility] ENDOSCOPY;  Service: Endoscopy;  Laterality: N/A;    Family History  Problem Relation Age of Onset  . Diabetes Mother   . Ovarian cancer Sister   . Cancer Sister     ovarian and colon  . Colon cancer Neg Hx     Social History:  reports that she has been smoking Cigarettes.  She has a 37.5 pack-year smoking history. She has never used smokeless tobacco. She reports that she drinks about 6.0 oz of alcohol per week. She reports that she does not use illicit  drugs.  Allergies:  Allergies  Allergen Reactions  . Penicillins     REACTION: GOES INTO SHOCK & THEN PASSES OU Has patient had a PCN reaction causing immediate rash, facial/tongue/throat swelling, SOB or lightheadedness with hypotension: NO Has patient had a PCN reaction causing severe rash involving mucus membranes or skin necrosis: NO Has patient had a PCN reaction that required hospitalization NO Has patient had a PCN reaction occurring within the last 10 years: NO If all of the above answers are "NO", then may proceed with Cephalosporin use.   . Avelox [Moxifloxacin Hcl In Nacl] Itching and Rash    Medications:  Scheduled Meds: . dolutegravir  50 mg Oral Daily  . emtricitabine-tenofovir AF  1 tablet Oral Daily  . feeding supplement (ENSURE ENLIVE)  237 mL Oral BID BM  . [START ON 11/09/2015] ferrous sulfate  325 mg Oral Q breakfast  . gabapentin  600 mg Oral BID  . lisinopril  10 mg Oral Daily  . mometasone-formoterol  2 puff Inhalation BID  . pantoprazole (PROTONIX) IV  40 mg Intravenous Q12H  . sucralfate  1 g Oral QID   Continuous Infusions:  PRN Meds:.acetaminophen **OR** acetaminophen, acyclovir, albuterol, baclofen, diazepam, loratadine   Results for orders placed or performed during the hospital encounter of 11/07/15 (from the past 48 hour(s))  Lipase, blood     Status: Abnormal   Collection Time: 11/07/15  3:38 PM  Result Value Ref Range   Lipase 67 (H) 11 - 51 U/L  Comprehensive metabolic panel     Status: Abnormal   Collection Time: 11/07/15  3:38 PM  Result Value Ref Range   Sodium 138 135 - 145 mmol/L   Potassium 3.7 3.5 - 5.1 mmol/L   Chloride 105 101 - 111 mmol/L   CO2 28 22 - 32 mmol/L   Glucose, Bld 109 (H) 65 - 99 mg/dL   BUN 25 (H) 6 - 20 mg/dL   Creatinine, Ser 1.26 (H) 0.44 - 1.00 mg/dL   Calcium 8.7 (L) 8.9 - 10.3 mg/dL   Total Protein 7.1 6.5 - 8.1 g/dL   Albumin 3.4 (L) 3.5 - 5.0 g/dL   AST 19 15 - 41 U/L   ALT 11 (L) 14 - 54 U/L    Alkaline Phosphatase 74 38 - 126 U/L   Total Bilirubin 0.4 0.3 - 1.2 mg/dL   GFR calc non Af Amer 42 (L) >60 mL/min   GFR calc Af Amer 48 (L) >60 mL/min    Comment: (NOTE) The eGFR has been calculated using the CKD EPI equation. This calculation has not been validated in all clinical situations. eGFR's persistently <60 mL/min signify possible Chronic Kidney Disease.    Anion gap 5 5 - 15  CBC     Status: Abnormal   Collection Time: 11/07/15  3:38 PM  Result  Range   WBC 5.0 4.0 - 10.5 K/uL   RBC 2.64 (L) 3.87 - 5.11 MIL/uL   Hemoglobin 7.2 (L) 12.0 - 15.0 g/dL   HCT 23.7 (L) 36.0 - 46.0 %   MCV 89.8 78.0 - 100.0 fL   MCH 27.3 26.0 - 34.0 pg   MCHC 30.4 30.0 - 36.0 g/dL   RDW 16.4 (H) 11.5 - 15.5 %   Platelets 283 150 - 400 K/uL  I-stat troponin, ED (not at [facility], [facility])     Status: None   Collection Time: 11/07/15  4:05 PM  Result Value Ref Range   Troponin i, poc 0.00 0.00 - 0.08 ng/mL   Comment 3            Comment: Due to the release kinetics of cTnI, a negative result within the first hours of the onset of symptoms does not rule out myocardial infarction with certainty. If myocardial infarction is still suspected, repeat the test at appropriate intervals.   POC occult blood, ED Provider will collect     Status: Abnormal   Collection Time: 11/07/15  8:48 PM  Result Value Ref Range   Fecal Occult Bld POSITIVE (A) NEGATIVE  CBC with Differential     Status: Abnormal   Collection Time: 11/07/15  9:56 PM  Result Value Ref Range   WBC 5.0 4.0 - 10.5 K/uL   RBC 2.78 (L) 3.87 - 5.11 MIL/uL   Hemoglobin 7.5 (L) 12.0 - 15.0 g/dL   HCT 25.1 (L) 36.0 - 46.0 %   MCV 90.3 78.0 - 100.0 fL   MCH 27.0 26.0 - 34.0 pg   MCHC 29.9 (L) 30.0 - 36.0 g/dL   RDW 16.2 (H) 11.5 - 15.5 %   Platelets 270 150 - 400 K/uL   Neutrophils Relative % 61 %   Lymphocytes Relative 31 %   Monocytes Relative 6 %   Eosinophils Relative 2 %   Basophils Relative 0 %   Neutro Abs 3.0 1.7 - 7.7  K/uL   Lymphs Abs 1.6 0.7 - 4.0 K/uL   Monocytes Absolute 0.3 0.1 - 1.0 K/uL   Eosinophils Absolute 0.1 0.0 - 0.7 K/uL   Basophils Absolute 0.0 0.0 - 0.1 K/uL   RBC Morphology SLIGHT POLYCHROMASIA NOTED   Type and screen [facility]     Status: None (Preliminary result)   Collection Time: 11/07/15  9:56 PM  Result Value Ref Range   ABO/RH(D) O POS    Antibody Screen NEG    Sample Expiration 11/10/2015    Unit Number [id number]    Blood Component Type RED CELLS,LR    Unit division 00    Status of Unit ISSUED    Transfusion Status OK TO TRANSFUSE    Crossmatch Result Compatible    Unit Number [id number]    Blood Component Type RBC LR PHER2    Unit division 00    Status of Unit ISSUED    Transfusion Status OK TO TRANSFUSE    Crossmatch Result Compatible   Vitamin B12     Status: None   Collection Time: 11/07/15  9:56 PM  Result Value Ref Range   Vitamin B-12 333 180 - 914 pg/mL    Comment: (NOTE) This assay is not validated for testing neonatal or myeloproliferative syndrome specimens for Vitamin B12 levels.   Folate     Status: None   Collection Time: 11/07/15  9:56 PM  Result Value Ref Range   Folate 11.3 >5.9 ng/mL    5.9 ng/mL  Iron and TIBC     Status: Abnormal   Collection Time: 11/07/15  9:56 PM  Result Value Ref Range   Iron 10 (L) 28 - 170 ug/dL   TIBC 566 (H) 250 - 450 ug/dL   Saturation Ratios 2 (L) 10.4 - 31.8 %   UIBC 556 ug/dL  Ferritin     Status: Abnormal   Collection Time: 11/07/15  9:56 PM  Result Value Ref Range   Ferritin 3 (L) 11 - 307 ng/mL  Reticulocytes     Status: Abnormal   Collection Time: 11/07/15  9:56 PM  Result Value Ref Range   Retic Ct Pct 2.1 0.4 - 3.1 %   RBC. 2.78 (L) 3.87 - 5.11 MIL/uL   Retic Count, Manual 58.4 19.0 - 186.0 K/uL  Prepare RBC     Status: None   Collection Time: 11/07/15 10:00 PM  Result Value Ref Range   Order Confirmation ORDER PROCESSED BY BLOOD BANK   I-stat troponin, ED     Status: None    Collection Time: 11/07/15 10:02 PM  Result Value Ref Range   Troponin i, poc 0.00 0.00 - 0.08 ng/mL   Comment 3            Comment: Due to the release kinetics of cTnI, a negative result within the first hours of the onset of symptoms does not rule out myocardial infarction with certainty. If myocardial infarction is still suspected, repeat the test at appropriate intervals.   I-stat Chem 8, ED     Status: Abnormal   Collection Time: 11/07/15 10:04 PM  Result Value Ref Range   Sodium 140 135 - 145 mmol/L   Potassium 3.6 3.5 - 5.1 mmol/L   Chloride 106 101 - 111 mmol/L   BUN 28 (H) 6 - 20 mg/dL   Creatinine, Ser 1.10 (H) 0.44 - 1.00 mg/dL   Glucose, Bld 125 (H) 65 - 99 mg/dL   Calcium, Ion 1.05 (L) 1.13 - 1.30 mmol/L   TCO2 23 0 - 100 mmol/L   Hemoglobin 8.5 (L) 12.0 - 15.0 g/dL   HCT 25.0 (L) 36.0 - 46.0 %  Urinalysis, Routine w reflex microscopic (not at [facility])     Status: Abnormal   Collection Time: 11/07/15 11:05 PM  Result Value Ref Range   Color, Urine YELLOW YELLOW   APPearance CLEAR CLEAR   Specific Gravity, Urine 1.015 1.005 - 1.030   pH 6.5 5.0 - 8.0   Glucose, UA NEGATIVE NEGATIVE mg/dL   Hgb urine dipstick NEGATIVE NEGATIVE   Bilirubin Urine NEGATIVE NEGATIVE   Ketones, ur NEGATIVE NEGATIVE mg/dL   Protein, ur NEGATIVE NEGATIVE mg/dL   Nitrite NEGATIVE NEGATIVE   Leukocytes, UA TRACE (A) NEGATIVE  Urine microscopic-add on     Status: Abnormal   Collection Time: 11/07/15 11:05 PM  Result Value Ref Range   Squamous Epithelial / LPF 0-5 (A) NONE SEEN   WBC, UA 0-5 0 - 5 WBC/hpf   RBC / HPF NONE SEEN 0 - 5 RBC/hpf   Bacteria, UA RARE (A) NONE SEEN   Casts HYALINE CASTS (A) NEGATIVE  CBC     Status: Abnormal   Collection Time: 11/08/15  2:52 AM  Result Value Ref Range   WBC 4.6 4.0 - 10.5 K/uL   RBC 2.78 (L) 3.87 - 5.11 MIL/uL   Hemoglobin 7.8 (L) 12.0 - 15.0 g/dL   HCT 25.2 (L) 36.0 - 46.0 %   MCV 90.6 78.0 - 100.0  fL   MCH 28.1 26.0 - 34.0 pg   MCHC 31.0  30.0 - 36.0 g/dL   RDW 16.3 (H) 11.5 - 15.5 %   Platelets 239 150 - 400 K/uL    Dg Chest Port 1 View  11/07/2015  CLINICAL DATA:  Low hemoglobin and fatigue weakness 1 week EXAM: PORTABLE CHEST 1 VIEW COMPARISON:  09/12/2014 FINDINGS: Stable mild to moderate cardiac enlargement. Vascular pattern normal. No consolidation or effusion. IMPRESSION: Stable cardiac enlargement with no acute findings Electronically Signed   By: Skipper Cliche M.D.   On: 11/07/2015 21:29    Review of Systems  Constitutional: Positive for malaise/fatigue. Negative for fever and diaphoresis.  HENT: Negative for congestion and sore throat.   Respiratory: Positive for shortness of breath.   Cardiovascular: Positive for chest pain, palpitations, orthopnea and PND. Negative for leg swelling.  Gastrointestinal: Positive for nausea and abdominal pain. Negative for vomiting, blood in stool and melena.  Genitourinary: Negative for hematuria.  Musculoskeletal: Positive for neck pain.  Neurological: Positive for dizziness and weakness.  All other systems reviewed and are negative.  Blood pressure 171/63, pulse 46, temperature 98.7 F (37.1 C), temperature source Oral, resp. rate 20, height 5' 2"  (1.575 m), weight 133 lb 6.4 oz (60.51 kg), SpO2 97 %. Physical Exam  Nursing note and vitals reviewed. Constitutional: She is oriented to person, place, and time. She appears well-developed and well-nourished. No distress.  HENT:  Head: Normocephalic and atraumatic.  Eyes: EOM are normal. Pupils are equal, round, and reactive to light. No scleral icterus.  Neck: Normal range of motion. Neck supple. No JVD present.  Cardiovascular: Regular rhythm, S1 normal and S2 normal.  Bradycardia present.   Murmur heard.  Systolic murmur is present with a grade of 2/6  Pulses:      Radial pulses are 2+ on the right side, and 2+ on the left side.       Dorsalis pedis pulses are 2+ on the right side.  Respiratory: Effort normal and breath  sounds normal. She has no wheezes. She has no rales.  GI: Soft. There is tenderness (across the epigastric area).  Musculoskeletal: She exhibits no edema.  Lymphadenopathy:    She has no cervical adenopathy.  Neurological: She is alert and oriented to person, place, and time. She exhibits normal muscle tone.  Skin: Skin is warm and dry.  Psychiatric: She has a normal mood and affect.    Assessment/Plan: Principal Problem:   GI bleed Active Problems:   Human immunodeficiency virus (HIV) disease ([facility])   TOBACCO ABUSE   Gastroesophageal reflux disease   Essential hypertension, benign   Iron deficiency anemia   Anal condylomata   Hepatitis C   Anxiety   Asthma, chronic   Bradycardia   Junctional bradycardia   Unstable angina   69 yo F PMHx significant for HTN, CAD(prior MIs no records), HIV, GERD, CHF, tobacco abuse, iron deficiency anemia.  Echocardiogram 03/09/2014 revealed ejection fraction of 65-70%, mild LVH, grade 1 diastolic dysfunction, mild MR, mildly dilated left atrium, peak PA pressure 32 mmHg. Mildly elevated LVOT gradient of 2.1 mm/s which was likely related to vigorous LV function. She presented with weakness fatigue and GI bleed.  Hgb 7.1.    Patient is having intermittent junctional and sinus bradycardia into the low 30's and she is feeling it.  Unfortunately she is not on any beta blockers that we can stop. She reports having frequent chest pain which appears to be worse with exertion  but does occur at rest.  She needs a left heart catheterization to rule out ischemic cause for her bradycardia.  Before considering a pacemaker. It is unfortunate that she is having a GI bleed which will make it difficult to stent an add antiplatelet therapy.  External PM pads applied.  What is reassuring is that she's had 2 negative troponin POC's.  Echo today. NPO now.   Tarri Fuller, [place] 11/08/2015, 9:48 AM   The patient was seen, examined and discussed with Tarri Fuller, PA-C and I  agree with the above.   69 year old female with prior h/o ? CAD ( no cath here), HIV, chronic diastolic CHF, who presented with chest pain, fatigue and presyncope, worsening DOE after few steps with presyncopal episodes. Telemetry shows sinus bradycardia with escape junctional bradycardia with HR down to 30". We will plan for a cath, if normal consult EP for a PM placement. If abnormal cath the situation would be complicated as she has worsening anemia and might be cleared by GI before stent placement.   Dorothy Spark 11/08/2015

## 2015-11-08 NOTE — Progress Notes (Signed)
One unit of PRBCs given. Second unit of PRBCs infusing now. Will continue to monitor patient to end of shift.

## 2015-11-08 NOTE — Progress Notes (Signed)
Radial band d/c per order no bleeding or bruising noted to site rt radial site. VSS, hr 35to45  MD aware pacer pads in place to patient chest.

## 2015-11-08 NOTE — Progress Notes (Addendum)
Called by nursing staff this morning to report bradycardia overnight into the 30s. Patient was repeatedly becoming bradycardic on telemetry. She has chronic bradycardia in the 50-60s at baseline noted by [facility] and cardiology in the past and is not on a beta-blocker. Telemetry reveals repeated, prolonged bradycardia into the 30s, no signs of dropped beats or AV block, but rhythm is irregular with pauses. EKG this morning is sinus rhythm with HR of 61. Patient has had palpitations, lightheadedness and DOE, but she is also experiencing symptomatic anemia secondary to GIB. No hypothermia. Bradycardia may be secondary to anemia, which would correct itself, but given unexplained cause of bradycardia, we have consult Cardiology for their advice. We greatly appreciate Cardiology input.  Osa Craver, DO PGY-2 Internal Medicine Resident Pager # [phone] 11/08/2015 8:59 AM

## 2015-11-08 NOTE — Progress Notes (Signed)
Pacer pads placed onto patient per order . Patient NPO. Will continue to monitor patient.

## 2015-11-09 ENCOUNTER — Other Ambulatory Visit (HOSPITAL_COMMUNITY): Payer: Medicare Other

## 2015-11-09 ENCOUNTER — Encounter (HOSPITAL_COMMUNITY): Payer: Self-pay | Admitting: Interventional Cardiology

## 2015-11-09 DIAGNOSIS — G2581 Restless legs syndrome: Secondary | ICD-10-CM

## 2015-11-09 DIAGNOSIS — Z79899 Other long term (current) drug therapy: Secondary | ICD-10-CM

## 2015-11-09 DIAGNOSIS — D500 Iron deficiency anemia secondary to blood loss (chronic): Secondary | ICD-10-CM | POA: Diagnosis not present

## 2015-11-09 DIAGNOSIS — R001 Bradycardia, unspecified: Secondary | ICD-10-CM | POA: Diagnosis not present

## 2015-11-09 DIAGNOSIS — Z7951 Long term (current) use of inhaled steroids: Secondary | ICD-10-CM

## 2015-11-09 DIAGNOSIS — Z21 Asymptomatic human immunodeficiency virus [HIV] infection status: Secondary | ICD-10-CM

## 2015-11-09 DIAGNOSIS — I10 Essential (primary) hypertension: Secondary | ICD-10-CM | POA: Diagnosis not present

## 2015-11-09 DIAGNOSIS — F419 Anxiety disorder, unspecified: Secondary | ICD-10-CM

## 2015-11-09 DIAGNOSIS — K922 Gastrointestinal hemorrhage, unspecified: Secondary | ICD-10-CM

## 2015-11-09 DIAGNOSIS — I2510 Atherosclerotic heart disease of native coronary artery without angina pectoris: Secondary | ICD-10-CM | POA: Diagnosis not present

## 2015-11-09 DIAGNOSIS — K31811 Angiodysplasia of stomach and duodenum with bleeding: Secondary | ICD-10-CM | POA: Diagnosis not present

## 2015-11-09 DIAGNOSIS — J449 Chronic obstructive pulmonary disease, unspecified: Secondary | ICD-10-CM

## 2015-11-09 DIAGNOSIS — M549 Dorsalgia, unspecified: Secondary | ICD-10-CM

## 2015-11-09 DIAGNOSIS — G8929 Other chronic pain: Secondary | ICD-10-CM

## 2015-11-09 LAB — CBC
HCT: 31.1 % — ABNORMAL LOW (ref 36.0–46.0)
Hemoglobin: 9.9 g/dL — ABNORMAL LOW (ref 12.0–15.0)
MCH: 28.6 pg (ref 26.0–34.0)
MCHC: 31.8 g/dL (ref 30.0–36.0)
MCV: 89.9 fL (ref 78.0–100.0)
Platelets: 245 10*3/uL (ref 150–400)
RBC: 3.46 MIL/uL — ABNORMAL LOW (ref 3.87–5.11)
RDW: 17 % — ABNORMAL HIGH (ref 11.5–15.5)
WBC: 6.6 10*3/uL (ref 4.0–10.5)

## 2015-11-09 LAB — TYPE AND SCREEN
ABO/RH(D): O POS
Antibody Screen: NEGATIVE
Unit division: 0
Unit division: 0

## 2015-11-09 MED ORDER — LORATADINE 10 MG PO TABS
10.0000 mg | ORAL_TABLET | Freq: Once | ORAL | Status: AC
  Administered 2015-11-09: 10 mg via ORAL
  Filled 2015-11-09: qty 1

## 2015-11-09 MED ORDER — AMLODIPINE BESYLATE 10 MG PO TABS
10.0000 mg | ORAL_TABLET | Freq: Every day | ORAL | Status: DC
  Administered 2015-11-09 – 2015-11-10 (×2): 10 mg via ORAL
  Filled 2015-11-09 (×2): qty 1

## 2015-11-09 MED ORDER — ATORVASTATIN CALCIUM 40 MG PO TABS
40.0000 mg | ORAL_TABLET | Freq: Every day | ORAL | Status: DC
  Administered 2015-11-09: 40 mg via ORAL
  Filled 2015-11-09: qty 1

## 2015-11-09 MED ORDER — SODIUM CHLORIDE 0.9 % IV SOLN
510.0000 mg | Freq: Once | INTRAVENOUS | Status: AC
Start: 2015-11-09 — End: 2015-11-09
  Administered 2015-11-09: 510 mg via INTRAVENOUS
  Filled 2015-11-09 (×2): qty 17

## 2015-11-09 MED ORDER — DM-GUAIFENESIN ER 30-600 MG PO TB12
1.0000 | ORAL_TABLET | Freq: Two times a day (BID) | ORAL | Status: DC
  Administered 2015-11-09 – 2015-11-10 (×3): 1 via ORAL
  Filled 2015-11-09 (×3): qty 1

## 2015-11-09 MED ORDER — POLYETHYLENE GLYCOL 3350 17 G PO PACK
17.0000 g | PACK | Freq: Every day | ORAL | Status: DC | PRN
  Administered 2015-11-09: 17 g via ORAL
  Filled 2015-11-09: qty 1

## 2015-11-09 MED ORDER — NICOTINE 14 MG/24HR TD PT24
14.0000 mg | MEDICATED_PATCH | Freq: Every day | TRANSDERMAL | Status: DC
  Administered 2015-11-09 – 2015-11-10 (×2): 14 mg via TRANSDERMAL
  Filled 2015-11-09 (×2): qty 1

## 2015-11-09 MED ORDER — LISINOPRIL 40 MG PO TABS
40.0000 mg | ORAL_TABLET | Freq: Every day | ORAL | Status: DC
  Administered 2015-11-09 – 2015-11-10 (×2): 40 mg via ORAL
  Filled 2015-11-09 (×2): qty 1

## 2015-11-09 NOTE — Care Management Obs Status (Signed)
[facility] NOTIFICATION   Patient Details  Name: Nicole Mccormick MRN: [id number] Date of Birth: 1946-04-04   Medicare Observation Status Notification Given:   yes    Royston Bake, RN 11/09/2015, 2:03 PM

## 2015-11-09 NOTE — Progress Notes (Signed)
PT refused to use spacer, states that she takes inhaler without spacer at home.

## 2015-11-09 NOTE — Progress Notes (Signed)
Subjective: Patient denied any shortness of breath, chest pain, or light-headedness this AM. However, she complains of the "sniffles" and not having a bowel movement in 3 days. She has not noted any blood.   Objective: Vital signs in last 24 hours: Filed Vitals:   11/09/15 0519 11/09/15 0521 11/09/15 0842 11/09/15 1209  BP: 158/72   163/47  Pulse: 57   55  Temp: 98 F (36.7 C)   98.5 F (36.9 C)  TempSrc: Oral   Oral  Resp: 20     Height:      Weight:  132 lb 9.6 oz (60.147 kg)    SpO2: 100%  97% 100%   Weight change: -12.8 oz (-0.363 kg)  Intake/Output Summary (Last 24 hours) at 11/09/15 1332 Last data filed at 11/09/15 1022  Gross per 24 hour  Intake   1723 ml  Output   1675 ml  Net     48 ml   Physical Exam: General: Lying in bed, NAD.  HEENT: No tonsillar erythema or exudates. Cardiac: bradycardic with 2/6 systolic murmur loudest over the RUSB, normal S1/S2 Pulm: breathing well, clear to auscultation bilaterally Abd: bowel sounds normal, soft, nondistended, tender in the epigastric region even with very light palpation. No rebound or rigidity.  Ext: warm and well perfused, without pedal edema  Lab Results: Basic Metabolic Panel:  Recent Labs Lab 11/07/15 1538 11/07/15 2204 11/08/15 1140  NA 138 140  --   K 3.7 3.6  --   CL 105 106  --   CO2 28  --   --   GLUCOSE 109* 125*  --   BUN 25* 28*  --   CREATININE 1.26* 1.10*  --   CALCIUM 8.7*  --   --   MG  --   --  2.4   CBC:  Recent Labs Lab 11/07/15 2156  11/08/15 1140 11/09/15 0539  WBC 5.0  < > 5.7 6.6  NEUTROABS 3.0  --   --   --   HGB 7.5*  < > 9.9* 9.9*  HCT 25.1*  < > 31.2* 31.1*  MCV 90.3  < > 89.4 89.9  PLT 270  < > 207 245  < > = values in this interval not displayed.  Anemia Panel:  Recent Labs Lab 11/07/15 2156  VITAMINB12 333  FOLATE 11.3  FERRITIN 3*  TIBC 566*  IRON 10*  RETICCTPCT 2.1    Urinalysis:  Studies/Results: Dg Chest Port 1 View  11/07/2015  CLINICAL  DATA:  Low hemoglobin and fatigue weakness 1 week EXAM: PORTABLE CHEST 1 VIEW COMPARISON:  09/12/2014 FINDINGS: Stable mild to moderate cardiac enlargement. Vascular pattern normal. No consolidation or effusion. IMPRESSION: Stable cardiac enlargement with no acute findings Electronically Signed   By: Skipper Cliche M.D.   On: 11/07/2015 21:29   Medications: I have reviewed the patient's current medications. Scheduled Meds: . atorvastatin  40 mg Oral q1800  . dextromethorphan-guaiFENesin  1 tablet Oral BID  . dolutegravir  50 mg Oral Daily  . emtricitabine-tenofovir AF  1 tablet Oral Daily  . feeding supplement (ENSURE ENLIVE)  237 mL Oral BID BM  . ferrous sulfate  325 mg Oral Q breakfast  . gabapentin  600 mg Oral BID  . heparin  5,000 Units Subcutaneous 3 times per day  . lisinopril  40 mg Oral Daily  . mometasone-formoterol  2 puff Inhalation BID  . nicotine  14 mg Transdermal Daily  . pantoprazole (PROTONIX) IV  40  mg Intravenous Q12H  . sodium chloride  3 mL Intravenous Q12H  . sucralfate  1 g Oral QID   Continuous Infusions:   PRN Meds:.sodium chloride, [DISCONTINUED] acetaminophen **OR** acetaminophen, acetaminophen, acyclovir, albuterol, baclofen, diazepam, loratadine, ondansetron (ZOFRAN) IV, polyethylene glycol, sodium chloride Assessment/Plan: Ms. Luebbert is a 69 year old African American lady with a history of a gastrointestinal bleed suspected to be from arteriovenous malformations, gastroesophageal reflux disease, well-controlled HIV, hypertension, tobacco abuse, coronary artery disease, and hepatitis C antibody positive presenting with symptomatic anemia, found to have a hemoglobin of 7.2 with a positive fecal occult blood test suggestive of another gastrointestinal bleed.   She has been compliant with taking her pantoprazole and oral iron, and has not been taking besides aspirin 81mg  daily. We'll continue her on IV pantoprazole, hold her aspirin and anti-hypertensives, and the  emergency physician has consulted gastroenterology for them to evaluate her tomorrow.  IDA 2/2 Gastrointestinal bleed: Per above, possibly due to arteriovenous malformations. Arteriovenous malformations were visualized last year but she never followed-up for push versus double balloon enteroscopy. Iron studies showing Iron 10, TIBC 566, and Ferritin 3. GI will not be doing any further inpatient workup because patient had an extensive workup a year ago. They are recommending outpatient workup. She may need a referral to [facility] for a double balloon enteroscopy. This issue will need to be resolved prior to any angioplasty (see CAD below). -Gastroenterology consulted; thank you for your help -Transfused 2 units PRBCs. Hgb 9.9 now.  -Pantoprazole 40mg  IV twice daily -Holding aspirin -Outpatient GI f/u  Coronary artery disease: Cath report demonstrates 85% stenosis in her RCA, but cardiology recommends medical management since antiplatelet therapy may lead to a recurrent GI bleed. Without any anginal symptoms, stent placement must be addressed after he GI bleeding issues have resolved -Holding aspirin 81mg  daily -EP to evaluate for pacemaker placement today - Start atorvastatin 40 mg daily, no interactions noted with her HIV medications  Human immunodeficiency virus: Her last CD4 was 550 with an undetectable viral load. She's compliant with her anti-retrovirals. -Continue Tivicay and Descovy  Chronic back pain: Not an acute issue. -Continue acetaminophen 650mg  every 6 hours for pain -Continue gabapentin 600mg  twice daily -Continue baclofen 10mg  three times daily as needed for muscle spasms  Hypertension: BP high this morning with systolic in the 123456 -Holding imdur 30mg  and chlorthalidone 25mg  -restarted lisinopril 40mg   Anxiety: Her mood is normal. -Continue diazepam 2mg  every 6 hours as needed for anxiety  Chronic obstructive pulmonary disease: Stable, not on home oxygen. -Continue  mometasone inhaler twice daily -Continue albuterol nebulizers every 6 hours as needed for wheezing  Restless leg syndrome: Likely worsened by her anemia.   Dispo: Disposition is deferred at this time, awaiting improvement of current medical problems.  Anticipated discharge in approximately 1-2 day(s).   The patient does have a current PCP Shela Leff, MD) and does need an [facility] hospital follow-up appointment after discharge.  The patient does not have transportation limitations that hinder transportation to clinic appointments.  .Services Needed at time of discharge: Y = Yes, Blank = No PT:   OT:   RN:   Equipment:   Other:       Liberty Handy, MD 11/09/2015, 1:32 PM

## 2015-11-09 NOTE — Consult Note (Signed)
ELECTROPHYSIOLOGY CONSULT NOTE    Patient ID: Nicole Mccormick MRN: [id number], DOB/AGE: [birth year] 69 y.o.  Admit date: 11/07/2015 Date of Consult: 11/09/2015  Primary Physician: Shela Leff, MD Primary Cardiologist: Meda Coffee (new this admission)  Reason for Consultation: bradycardia  HPI:  Nicole Mccormick is a 69 y.o. female with a past medical history significant for HIV, GERD, diastolic heart failure, AVM's with chronic anemia, and hypertension. She also has chronic bradycardia that has been asymptomatic for the most part. She presented to the hospital with increased fatigue and shortness of breath on exertion. Catheterization this admission demonstrated complex calcific RCA disease, distal PDA with diffuse disease.  Medical therapy was recommended.  EP has been asked to evaluate for bradycardia.   Past Medical History  Diagnosis Date  . Hypertension   . Abnormal Pap smear   . Asthma   . Coronary artery disease   . HIV infection ([place])     diagnosed before 2008  . Varicosities   . Seizures ([place])     last sz fri nov 2  . GERD (gastroesophageal reflux disease)     previously on aciphex, discontinued november 2012  because patient asymptomatic, and concern about interference with HIV meds.  May try pepcid in the future if symptoms return  . Iron deficiency anemia     Ferritin = 2 in november 2012, started on iron supplemenation  . Arthritis   . CHF (congestive heart failure) ([place])   . Heart murmur   . Hepatitis C antibody test positive   . Complication of anesthesia     "they have a hard time bringing me back" (11/08/2015)     Surgical History:  Past Surgical History  Procedure Laterality Date  . Abdominal hysterectomy    . Esophagogastroduodenoscopy  10/13/11    small hiatal hernia  . Colonoscopy  10/13/11    small adenoma, anal condyloma  . Colonoscopy  10/13/2011    Procedure: COLONOSCOPY;  Surgeon: Gatha Mayer, MD;  Location: [place];  Service: Endoscopy;   Laterality: N/A;  . Esophagogastroduodenoscopy  10/13/2011    Procedure: ESOPHAGOGASTRODUODENOSCOPY (EGD);  Surgeon: Gatha Mayer, MD;  Location: [facility] ENDOSCOPY;  Service: Endoscopy;  Laterality: N/A;  . Esophagogastroduodenoscopy N/A 09/14/2014    Procedure: ESOPHAGOGASTRODUODENOSCOPY (EGD);  Surgeon: Arta Silence, MD;  Location: [facility] ENDOSCOPY;  Service: Endoscopy;  Laterality: N/A;  . Colonoscopy N/A 09/14/2014    Procedure: COLONOSCOPY;  Surgeon: Arta Silence, MD;  Location: [facility] ENDOSCOPY;  Service: Endoscopy;  Laterality: N/A;  . Givens capsule study N/A 09/14/2014    Procedure: GIVENS CAPSULE STUDY;  Surgeon: Arta Silence, MD;  Location: [facility] ENDOSCOPY;  Service: Endoscopy;  Laterality: N/A;  . Cardiac catheterization N/A 11/08/2015    Procedure: Left Heart Cath and Coronary Angiography;  Surgeon: Jettie Booze, MD;  Location: [place] CV LAB;  Service: Cardiovascular;  Laterality: N/A;     Prescriptions prior to admission  Medication Sig Dispense Refill Last Dose  . acyclovir (ZOVIRAX) 400 MG tablet Take 1 tablet (400 mg total) by mouth daily as needed (for flare ups). 30 tablet 3 11/07/2015 at Unknown time  . albuterol (PROVENTIL HFA;VENTOLIN HFA) 108 (90 BASE) MCG/ACT inhaler Inhale 2 puffs into the lungs every 6 (six) hours as needed for wheezing or shortness of breath. 1 Inhaler 2 Past Week at Unknown time  . albuterol (PROVENTIL) (2.5 MG/3ML) 0.083% nebulizer solution Take 3 mLs (2.5 mg total) by nebulization every 6 (six) hours as needed for wheezing or shortness of breath.  75 mL 12 Past Week at Unknown time  . amLODipine (NORVASC) 10 MG tablet Take 1 tablet (10 mg total) by mouth daily. 90 tablet 3 11/07/2015 at Unknown time  . baclofen (LIORESAL) 10 MG tablet Take 10 mg by mouth 3 (three) times daily as needed for muscle spasms.   11/07/2015 at Unknown time  . chlorthalidone (HYGROTON) 25 MG tablet Take 1 tablet (25 mg total) by mouth daily. 90 tablet 3 11/07/2015 at Unknown  time  . diazepam (VALIUM) 2 MG tablet take 1 tablet by mouth every 6 hours if needed for anxiety 30 tablet 0 11/06/2015 at Unknown time  . dolutegravir (TIVICAY) 50 MG tablet Take 1 tablet (50 mg total) by mouth daily. 90 tablet 3 11/07/2015 at Unknown time  . emtricitabine-tenofovir AF (DESCOVY) 200-25 MG tablet Take 1 tablet by mouth daily. 90 tablet 3 11/07/2015 at Unknown time  . ferrous sulfate 325 (65 FE) MG tablet Take 1 tablet (325 mg total) by mouth 3 (three) times daily with meals. 90 tablet 3 11/07/2015 at Unknown time  . gabapentin (NEURONTIN) 600 MG tablet Take 600 mg by mouth 2 (two) times daily.   11/07/2015 at Unknown time  . isosorbide mononitrate (IMDUR) 30 MG 24 hr tablet Take 1 tablet (30 mg total) by mouth daily. 90 tablet 3 11/07/2015 at Unknown time  . lisinopril (PRINIVIL,ZESTRIL) 40 MG tablet Take 1 tablet (40 mg total) by mouth daily. 90 tablet 3 11/07/2015 at Unknown time  . loratadine (CLARITIN) 10 MG tablet Take 1 tablet (10 mg total) by mouth daily as needed for allergies. 90 tablet 3 Past Week at Unknown time  . mometasone (ASMANEX) 220 MCG/INH inhaler Inhale 1 puff into the lungs 2 (two) times daily. 1 Inhaler 2 11/07/2015 at Unknown time  . Nutritional Supplements (ENSURE NUTRA SHAKE HI-CAL) LIQD Take 1 Can by mouth 2 (two) times daily. 60 Can 11 Past Week at Unknown time  . pantoprazole (PROTONIX) 40 MG tablet Take 1 tablet (40 mg total) by mouth 2 (two) times daily. 180 tablet 3 11/07/2015 at Unknown time  . sucralfate (CARAFATE) 1 G tablet Take 1 tablet (1 g total) by mouth 4 (four) times daily. 120 tablet 11 11/07/2015 at Unknown time  . azithromycin (ZITHROMAX) 250 MG tablet 2 po on day 1, 1 po on days 2-5 (Patient not taking: Reported on 11/07/2015) 6 each 0 Not Taking at Unknown time  . ondansetron (ZOFRAN-ODT) 4 MG disintegrating tablet dissolve 1 tablet ON TONGUE every 4 hours if needed for nausea and vomiting (Patient not taking: Reported on 11/07/2015) 20  tablet 2 Not Taking at Unknown time  . pneumococcal 13-valent conjugate vaccine (PREVNAR 13) SUSP injection Inject 0.5 mLs into the muscle tomorrow at 10 am. (Patient not taking: Reported on 11/07/2015) 0.5 mL 0 Not Taking at Unknown time  . ZOSTAVAX 91478 UNT/0.65ML injection Inject 19,400 Units into the skin once. (Patient not taking: Reported on 11/07/2015) 1 each 0 Not Taking at Unknown time    Inpatient Medications:  . atorvastatin  40 mg Oral q1800  . dextromethorphan-guaiFENesin  1 tablet Oral BID  . dolutegravir  50 mg Oral Daily  . emtricitabine-tenofovir AF  1 tablet Oral Daily  . feeding supplement (ENSURE ENLIVE)  237 mL Oral BID BM  . ferrous sulfate  325 mg Oral Q breakfast  . gabapentin  600 mg Oral BID  . heparin  5,000 Units Subcutaneous 3 times per day  . lisinopril  40 mg Oral Daily  .  mometasone-formoterol  2 puff Inhalation BID  . nicotine  14 mg Transdermal Daily  . pantoprazole (PROTONIX) IV  40 mg Intravenous Q12H  . sodium chloride  3 mL Intravenous Q12H  . sucralfate  1 g Oral QID    Allergies:  Allergies  Allergen Reactions  . Penicillins     REACTION: GOES INTO SHOCK & THEN PASSES OU Has patient had a PCN reaction causing immediate rash, facial/tongue/throat swelling, SOB or lightheadedness with hypotension: NO Has patient had a PCN reaction causing severe rash involving mucus membranes or skin necrosis: NO Has patient had a PCN reaction that required hospitalization NO Has patient had a PCN reaction occurring within the last 10 years: NO If all of the above answers are "NO", then may proceed with Cephalosporin use.   . Avelox [Moxifloxacin Hcl In Nacl] Itching and Rash    Social History   Social History  . Marital Status: Married    Spouse Name: N/A  . Number of Children: N/A  . Years of Education: N/A   Occupational History  . Not on file.   Social History Main Topics  . Smoking status: Current Every Day Smoker -- 0.75 packs/day for 50  years    Types: Cigarettes  . Smokeless tobacco: Never Used  . Alcohol Use: 6.0 oz/week    10 Glasses of wine per week  . Drug Use: No  . Sexual Activity: Not on file     Comment: pt. given condoms   Other Topics Concern  . Not on file   Social History Narrative     Family History  Problem Relation Age of Onset  . Diabetes Mother   . Ovarian cancer Sister   . Cancer Sister     ovarian and colon  . Colon cancer Neg Hx      Review of Systems: All other systems reviewed and are otherwise negative except as noted above.  Physical Exam: Filed Vitals:   11/09/15 0519 11/09/15 0521 11/09/15 0842 11/09/15 1209  BP: 158/72   163/47  Pulse: 57   55  Temp: 98 F (36.7 C)   98.5 F (36.9 C)  TempSrc: Oral   Oral  Resp: 20     Height:      Weight:  132 lb 9.6 oz (60.147 kg)    SpO2: 100%  97% 100%    GEN- The patient is well appearing, alert and oriented x 3 today.   HEENT: normocephalic, atraumatic; sclera clear, conjunctiva pale; hearing intact; oropharynx clear; neck supple Lungs- Clear to ausculation bilaterally, normal work of breathing.  No wheezes, rales, rhonchi Heart- Regular rate and rhythm  GI- soft, non-tender, non-distended, bowel sounds present Extremities- no clubbing, cyanosis, or edema; DP/PT/radial pulses 2+ bilaterally MS- no significant deformity or atrophy Skin- warm and dry, no rash or lesion Psych- euthymic mood, full affect Neuro- strength and sensation are intact  Labs:   Lab Results  Component Value Date   WBC 6.6 11/09/2015   HGB 9.9* 11/09/2015   HCT 31.1* 11/09/2015   MCV 89.9 11/09/2015   PLT 245 11/09/2015    Recent Labs Lab 11/07/15 1538 11/07/15 2204  NA 138 140  K 3.7 3.6  CL 105 106  CO2 28  --   BUN 25* 28*  CREATININE 1.26* 1.10*  CALCIUM 8.7*  --   PROT 7.1  --   BILITOT 0.4  --   ALKPHOS 74  --   ALT 11*  --   AST 19  --  GLUCOSE 109* 125*      Radiology/Studies: Dg Chest Port 1 View 11/07/2015  CLINICAL  DATA:  Low hemoglobin and fatigue weakness 1 week EXAM: PORTABLE CHEST 1 VIEW COMPARISON:  09/12/2014 FINDINGS: Stable mild to moderate cardiac enlargement. Vascular pattern normal. No consolidation or effusion. IMPRESSION: Stable cardiac enlargement with no acute findings Electronically Signed   By: Skipper Cliche M.D.   On: 11/07/2015 21:29    EKG: sinus bradycardia, rate 48, normal intervals  TELEMETRY: sinus bradycardia, rates 40-50's  Assessment/Plan: 1.  Sinus bradycardia The patient has sinus bradycardia with symptoms of fatigue and shortness of breath on exertion. She also has GI bleeding and anemia.  The onset of anemia seems to correspond with her symptoms. Her bradycardia is longstanding and persistent without symptoms prior to anemia onset. Dr Rayann Heman discussed with patient - for now, we recommend pursuing GI workup and correcting anemia. If her symptoms of lightheadedness and fatigue persist despite normal CBC, would recommend consideration of pacemaker implant at that time.   2.  HTN Stable No change required today Would avoid rate slowing agents  Electrophysiology team to see as needed while here. Please call with questions.   Signed, Chanetta Marshall, NP 11/09/2015 12:48 PM  I have seen, examined the patient, and reviewed the above assessment and plan.  On exam, comfortable appearing.  NAD.   Changes to above are made where necessary.  I suspect that most of her symptoms are due to her struggles with anemia.  I would advise further anemia workup/ management.  No acute indication for pacing, though we could consider pacing for sinus bradycardia if her symptoms persist once her anemia is resolved.  EP to see in the office for follow-up electively.  No further inpatient EP workup planned at this time.  Co Sign: Thompson Grayer, MD 11/09/2015 4:10 PM

## 2015-11-09 NOTE — Progress Notes (Signed)
Paged on-call doctor for patient request something for allergies.Awaiting return page and/or orders.

## 2015-11-09 NOTE — Progress Notes (Signed)
Telephone order given to put in order for a one time dose of Claritin. Will administer per order and continue to monitor patient to end of shift.

## 2015-11-10 ENCOUNTER — Encounter (HOSPITAL_COMMUNITY): Payer: Self-pay | Admitting: Internal Medicine

## 2015-11-10 ENCOUNTER — Observation Stay (HOSPITAL_BASED_OUTPATIENT_CLINIC_OR_DEPARTMENT_OTHER): Payer: Medicare Other

## 2015-11-10 DIAGNOSIS — K922 Gastrointestinal hemorrhage, unspecified: Secondary | ICD-10-CM | POA: Diagnosis not present

## 2015-11-10 DIAGNOSIS — Z21 Asymptomatic human immunodeficiency virus [HIV] infection status: Secondary | ICD-10-CM | POA: Diagnosis not present

## 2015-11-10 DIAGNOSIS — R0600 Dyspnea, unspecified: Secondary | ICD-10-CM

## 2015-11-10 DIAGNOSIS — R001 Bradycardia, unspecified: Secondary | ICD-10-CM | POA: Diagnosis not present

## 2015-11-10 DIAGNOSIS — I10 Essential (primary) hypertension: Secondary | ICD-10-CM | POA: Diagnosis not present

## 2015-11-10 DIAGNOSIS — D500 Iron deficiency anemia secondary to blood loss (chronic): Secondary | ICD-10-CM | POA: Diagnosis not present

## 2015-11-10 DIAGNOSIS — I2510 Atherosclerotic heart disease of native coronary artery without angina pectoris: Secondary | ICD-10-CM | POA: Diagnosis not present

## 2015-11-10 LAB — BASIC METABOLIC PANEL
Anion gap: 5 (ref 5–15)
BUN: 16 mg/dL (ref 6–20)
CO2: 28 mmol/L (ref 22–32)
Calcium: 8.9 mg/dL (ref 8.9–10.3)
Chloride: 104 mmol/L (ref 101–111)
Creatinine, Ser: 0.85 mg/dL (ref 0.44–1.00)
GFR calc Af Amer: >60 mL/min (ref >60)
GFR calc non Af Amer: >60 mL/min (ref >60)
Glucose, Bld: 101 mg/dL — ABNORMAL HIGH (ref 65–99)
Potassium: 4.3 mmol/L (ref 3.5–5.1)
Sodium: 137 mmol/L (ref 135–145)

## 2015-11-10 LAB — CBC
HCT: 33.1 % — ABNORMAL LOW (ref 36.0–46.0)
Hemoglobin: 10.2 g/dL — ABNORMAL LOW (ref 12.0–15.0)
MCH: 27.9 pg (ref 26.0–34.0)
MCHC: 30.8 g/dL (ref 30.0–36.0)
MCV: 90.7 fL (ref 78.0–100.0)
Platelets: 273 10*3/uL (ref 150–400)
RBC: 3.65 MIL/uL — ABNORMAL LOW (ref 3.87–5.11)
RDW: 17.1 % — ABNORMAL HIGH (ref 11.5–15.5)
WBC: 5.5 10*3/uL (ref 4.0–10.5)

## 2015-11-10 MED ORDER — ASPIRIN 81 MG PO TABS: 81.0000 mg | ORAL_TABLET | Freq: Every day | ORAL | Status: DC

## 2015-11-10 MED ORDER — ATORVASTATIN CALCIUM 40 MG PO TABS: 40.0000 mg | ORAL_TABLET | Freq: Every day | ORAL | Status: DC

## 2015-11-10 MED ORDER — PANTOPRAZOLE SODIUM 40 MG PO TBEC: 40.0000 mg | DELAYED_RELEASE_TABLET | Freq: Two times a day (BID) | ORAL | Status: DC

## 2015-11-10 MED ORDER — NICOTINE 14 MG/24HR TD PT24: 14.0000 mg | MEDICATED_PATCH | Freq: Every day | TRANSDERMAL | Status: DC

## 2015-11-10 MED ORDER — NICOTINE POLACRILEX 4 MG MT GUM: 4.0000 mg | CHEWING_GUM | OROMUCOSAL | Status: DC | PRN

## 2015-11-10 MED ORDER — DM-GUAIFENESIN ER 30-600 MG PO TB12: 1.0000 | ORAL_TABLET | Freq: Two times a day (BID) | ORAL | Status: DC

## 2015-11-10 MED ORDER — POLYETHYLENE GLYCOL 3350 17 G PO PACK: 17.0000 g | PACK | Freq: Every day | ORAL | Status: DC | PRN

## 2015-11-10 NOTE — Progress Notes (Signed)
Subjective: Patient denied any shortness of breath and light-headedness this AM.  She described a "pulling" sensation on her left chest that is worse when she presses on her chest. She had a large, formed bowel movement without any blood. She had questions about the management of her CAD, bleeding episodes going forward, which we answered.   Objective: Vital signs in last 24 hours: Filed Vitals:   11/10/15 0740 11/10/15 0824 11/10/15 1244 11/10/15 1400  BP:   138/42 138/40  Pulse:   54 54  Temp:   98.7 F (37.1 C)   TempSrc:   Oral   Resp:   16   Height:      Weight: 131 lb 8 oz (59.648 kg)     SpO2:  95% 100% 100%   Weight change:   Intake/Output Summary (Last 24 hours) at 11/10/15 1604 Last data filed at 11/10/15 1246  Gross per 24 hour  Intake   1190 ml  Output   2750 ml  Net  -1560 ml   Physical Exam: General: Lying in bed, NAD.  Cardiac/Chest: bradycardic with 2/6 systolic murmur loudest over the RUSB, normal S1/S2. Palpation of left chest reproduces chest pain. Pulm: Mild rhonchi auscultated. Good air movement bilaterally.  Abd: bowel sounds normal, soft, nondistended. No tenderness to palpation today. No rebound or rigidity.  Ext: warm and well perfused, without pedal edema  Lab Results: Basic Metabolic Panel:  Recent Labs Lab 11/07/15 1538 11/07/15 2204 11/08/15 1140 11/10/15 0444  NA 138 140  --  137  K 3.7 3.6  --  4.3  CL 105 106  --  104  CO2 28  --   --  28  GLUCOSE 109* 125*  --  101*  BUN 25* 28*  --  16  CREATININE 1.26* 1.10*  --  0.85  CALCIUM 8.7*  --   --  8.9  MG  --   --  2.4  --    CBC:  Recent Labs Lab 11/07/15 2156  11/09/15 0539 11/10/15 0444  WBC 5.0  < > 6.6 5.5  NEUTROABS 3.0  --   --   --   HGB 7.5*  < > 9.9* 10.2*  HCT 25.1*  < > 31.1* 33.1*  MCV 90.3  < > 89.9 90.7  PLT 270  < > 245 273  < > = values in this interval not displayed.  Anemia Panel:  Recent Labs Lab 11/07/15 2156  VITAMINB12 333  FOLATE 11.3    FERRITIN 3*  TIBC 566*  IRON 10*  RETICCTPCT 2.1    Urinalysis:  Studies/Results: No results found. Medications: I have reviewed the patient's current medications. Scheduled Meds: . amLODipine  10 mg Oral Daily  . atorvastatin  40 mg Oral q1800  . dextromethorphan-guaiFENesin  1 tablet Oral BID  . dolutegravir  50 mg Oral Daily  . emtricitabine-tenofovir AF  1 tablet Oral Daily  . feeding supplement (ENSURE ENLIVE)  237 mL Oral BID BM  . ferrous sulfate  325 mg Oral Q breakfast  . gabapentin  600 mg Oral BID  . heparin  5,000 Units Subcutaneous 3 times per day  . lisinopril  40 mg Oral Daily  . mometasone-formoterol  2 puff Inhalation BID  . nicotine  14 mg Transdermal Daily  . pantoprazole  40 mg Oral BID  . sodium chloride  3 mL Intravenous Q12H  . sucralfate  1 g Oral QID   Continuous Infusions:   PRN Meds:.sodium chloride, [DISCONTINUED]  acetaminophen **OR** acetaminophen, acetaminophen, acyclovir, albuterol, baclofen, diazepam, loratadine, ondansetron (ZOFRAN) IV, polyethylene glycol, sodium chloride  Assessment/Plan:   IDA 2/2 Gastrointestinal bleed: GI has performed extensive workup previously with capsule endoscopy, EGD, and colonoscopy. She has had AVMs in her small intestine in the past. There will be no further workup as an inpatient, but she may obtain additional workup such as push enteroscopy. We have arranged follow-up with a gastroenterologist as an outpatient. She has remained hemodynamically stable without any additional bleeding episodes and a Hgb rising to 10.2. She is stable for discharge.  -Pantoprazole 40mg  po BID - f/u outpatient GI  Coronary artery disease: Cath report demonstrates 85% stenosis in her RCA. She was seen by cardiology, who felt comfortable with 81 mg aspirin daily. She may require stent placement in the future once her GI bleeding has been fully characterized and addressed. -ASA 81 mg daily -EP to evaluate for pacemaker placement  today - Start atorvastatin 40 mg daily, no interactions noted with her HIV medications  Possible Congestive Heart Failure (EF 35-45% vs 70-75%): Discrepancy between cath (35-45%) and TTE (70-75%). She does not appear volume overloaded and has not had recent shortness of breath. No crackles on exam. This will need to be reconsidered as outpatient.   Bradycardia: She was evaluated by EP who felt that her symptoms of light-headedness were more directly related to her anemia. HR has remained >50 in that past 24 hours.   Human immunodeficiency virus: Her last CD4 was 550 with an undetectable viral load. She's compliant with her anti-retrovirals. -Continue Tivicay and Descovy  Chronic back pain: Not an acute issue. -Continue acetaminophen 650mg  every 6 hours for pain -Continue gabapentin 600mg  twice daily -Continue baclofen 10mg  three times daily as needed for muscle spasms  Hypertension: BP high this morning with systolic in the 123456 -Restart imdur 30mg   -continue lisinopril 40mg  - Discontinue chlorthalidone 25mg   Anxiety: Her mood is normal. -Continue diazepam 2mg  every 6 hours as needed for anxiety  Chronic obstructive pulmonary disease: Stable, not on home oxygen. -Continue mometasone inhaler twice daily -Continue albuterol nebulizers every 6 hours as needed for wheezing  Dispo:   Anticipated discharge today.  The patient does have a current PCP Shela Leff, MD) and does need an [facility] hospital follow-up appointment after discharge.  The patient does not have transportation limitations that hinder transportation to clinic appointments.  .Services Needed at time of discharge: Y = Yes, Blank = No PT:   OT:   RN:   Equipment:   Other:       Liberty Handy, MD 11/10/2015, 4:04 PM

## 2015-11-10 NOTE — Progress Notes (Addendum)
Patient Name: Nicole Mccormick Date of Encounter: 11/10/2015  Principal Problem:   GI bleed Active Problems:   Human immunodeficiency virus (HIV) disease ([place])   TOBACCO ABUSE   Gastroesophageal reflux disease   Essential hypertension, benign   Iron deficiency anemia   Anal condylomata   Hepatitis C   Anxiety   Asthma, chronic   Pain in the chest   Heart block   Iron deficiency anemia due to chronic blood loss   Bradycardia with 31 - 40 beats per minute   Length of Stay:   SUBJECTIVE  Denies CP, SOB or dizziness.   CURRENT MEDS . amLODipine  10 mg Oral Daily  . atorvastatin  40 mg Oral q1800  . dextromethorphan-guaiFENesin  1 tablet Oral BID  . dolutegravir  50 mg Oral Daily  . emtricitabine-tenofovir AF  1 tablet Oral Daily  . feeding supplement (ENSURE ENLIVE)  237 mL Oral BID BM  . ferrous sulfate  325 mg Oral Q breakfast  . gabapentin  600 mg Oral BID  . heparin  5,000 Units Subcutaneous 3 times per day  . lisinopril  40 mg Oral Daily  . mometasone-formoterol  2 puff Inhalation BID  . nicotine  14 mg Transdermal Daily  . pantoprazole (PROTONIX) IV  40 mg Intravenous Q12H  . sodium chloride  3 mL Intravenous Q12H  . sucralfate  1 g Oral QID    OBJECTIVE  Filed Vitals:   11/09/15 2106 11/10/15 0500 11/10/15 0740 11/10/15 0824  BP: 154/59 161/71    Pulse: 50 53    Temp: 97.7 F (36.5 C) 97.7 F (36.5 C)    TempSrc: Oral Oral    Resp: 17 16    Height:      Weight:   131 lb 8 oz (59.648 kg)   SpO2: 98% 97%  95%    Intake/Output Summary (Last 24 hours) at 11/10/15 0959 Last data filed at 11/10/15 0943  Gross per 24 hour  Intake   1590 ml  Output   3200 ml  Net  -1610 ml   Filed Weights   11/07/15 2355 11/09/15 0521 11/10/15 0740  Weight: 133 lb 6.4 oz (60.51 kg) 132 lb 9.6 oz (60.147 kg) 131 lb 8 oz (59.648 kg)    PHYSICAL EXAM  General: Pleasant, NAD. Neuro: Alert and oriented X 3. Moves all extremities spontaneously. Psych: Normal  affect. HEENT:  Normal  Neck: Supple without bruits or JVD. Lungs:  Resp regular and unlabored, CTA. Heart: RRR no s3, s4, or murmurs. Abdomen: Soft, non-tender, non-distended, BS + x 4.  Extremities: No clubbing, cyanosis or edema. DP/PT/Radials 2+ and equal bilaterally.  Accessory Clinical Findings  CBC  Recent Labs  11/07/15 2156  11/09/15 0539 11/10/15 0444  WBC 5.0  < > 6.6 5.5  NEUTROABS 3.0  --   --   --   HGB 7.5*  < > 9.9* 10.2*  HCT 25.1*  < > 31.1* 33.1*  MCV 90.3  < > 89.9 90.7  PLT 270  < > 245 273  < > = values in this interval not displayed. Basic Metabolic Panel  Recent Labs  11/07/15 1538 11/07/15 2204 11/08/15 1140 11/10/15 0444  NA 138 140  --  137  K 3.7 3.6  --  4.3  CL 105 106  --  104  CO2 28  --   --  28  GLUCOSE 109* 125*  --  101*  BUN 25* 28*  --  16  CREATININE 1.26* 1.10*  --  0.85  CALCIUM 8.7*  --   --  8.9  MG  --   --  2.4  --    Liver Function Tests  Recent Labs  11/07/15 1538  AST 19  ALT 11*  ALKPHOS 74  BILITOT 0.4  PROT 7.1  ALBUMIN 3.4*    Recent Labs  11/07/15 1538  LIPASE 67*   Radiology/Studies  Dg Chest Port 1 View  11/07/2015  CLINICAL DATA:  Low hemoglobin and fatigue weakness 1 week EXAM: PORTABLE CHEST 1 VIEW COMPARISON:  09/12/2014 FINDINGS: Stable mild to moderate cardiac enlargement. Vascular pattern normal. No consolidation or effusion. IMPRESSION: Stable cardiac enlargement with no acute findings Electronically Signed   By: Skipper Cliche M.D.   On: 11/07/2015 21:29   TELE: Sinus bradycardia and junctional bradycardia with HR down to 38 BPM.   ASSESSMENT AND PLAN  Nicole Mccormick is a 69 y.o. female with a past medical history significant for HIV, GERD, diastolic heart failure, AVM's with chronic anemia, and hypertension. She also has chronic bradycardia that has been asymptomatic for the most part. She presented to the hospital with increased fatigue and shortness of breath on exertion.  Catheterization this admission demonstrated complex calcific RCA disease, distal PDA with diffuse disease. Medical therapy was recommended. EP has been asked to evaluate for bradycardia.   1. Sinus bradycardia - Dr Rayann Heman: "I suspect that most of her symptoms are due to her struggles with anemia. I would advise further anemia workup/ management. No acute indication for pacing, though we could consider pacing for sinus bradycardia if her symptoms persist once her anemia is resolved. EP to see in the office for follow-up electively. No further inpatient EP workup planned at this time."  2. CAD - the patient underwent a cath on 11/08/15, it showed Prox RCA to Mid RCA lesion, 80% stenosed and RPDA-severely diseased, 95% stenosed. This is a Complex, calcific RCA disease. The distal PDA is small and diffusely diseased. Her circumflex is codominant. We think medical therapy is the best treatemnt at this time given her issues with GI bleeding. Rotational atherectomy would be needed and high risk given the tortuousity. She should continue atorvastatin and needs to be started on aspirin 81 mg po daily. She is asymptomatic, we ware not planning on any intervention, we will add imdur 30 mg po daily.  No GI source of bleeding identified so far. I think taht she would be stable to undergo double balloon enteroscopy as the benefits outweight the risks. We would monitor her closely.   3. HTN - add Imdur 30 mg po daily, avoid betablockers with bradycardia.   Signed, Dorothy Spark MD, [facility] 11/10/2015

## 2015-11-10 NOTE — Progress Notes (Signed)
Pt got discharged, discharge instructions provided and patient showed understanding to it, IV taken out,Telemonitor DC,pt left unit in wheelchair with all of the belongings. 

## 2015-11-10 NOTE — Discharge Instructions (Addendum)
Mrs. Gage, it was a pleasure taking care of you in the hospital. As we discussed, you have two main problems: 1) Bleeding in your gut and 2) Narrow arteries in your heart, which may lead to a heart attack. In order to make sure you have the best treatment going forward, it may be best to address the bleeding problems first, since the stents to treat your narrow heart arteries would require you to be on a blood thinner called Plavix. Plavix could possibly worsen bleeding. Therefore, the best option going forward is likely to see the source of your bleeding and treat that before the heart doctors treat your heart.  Therefore, I have made appointments with a Cardiologist, a Copywriter, advertising (for your bleeding), and our clinic here at the hospital. Together, a plan will be made over time to work out the best approach for your heart and bleeding problem.   We have restarted you on a weak blood thinner, aspirin, which we you were taking home, which was recommended by the cardiologist.   We added a new medication: Atorvastatin 40 mg daily which helps protect your heart.  If you notice blood in your stool, please return to the hospital. Symptoms of blood loss also include chest pain, shortness of breath, and light-headedness. If you notice severe chest pain that does not improve, please seek medical attention.

## 2015-11-10 NOTE — Progress Notes (Signed)
  Date: 11/10/2015  Patient name: Nicole Mccormick  Medical record number: [id number]  Date of birth: 04-23-1946   This patient has been seen and the plan of care was discussed with the house staff. Please see their note for complete details. I concur with their findings with the following additions/corrections: [name] is doing well today although c/o pulling in L anterior chest that is worse when she palpates the area. Dr Meda Coffee answered all of my questions her her note.  Resume ASA 81 mg today Would be safe cardiac wise to have further GI W/U (like push enteroscopy) as benefits out weight risk Close F/U to monitor HgB We educated her to return if she develops her sxs of anemia  ACE for low EF. Cannot tolerate BB. Euvolemic so no need for lasix Statin and ASA for CAD D/C home   Bartholomew Crews, MD 11/10/2015, 1:17 PM

## 2015-11-10 NOTE — Discharge Summary (Signed)
Name: Nicole Mccormick MRN: [id number] DOB: July 31, 1946 69 y.o. PCP: Shela Leff, MD  Date of Admission: 11/07/2015  7:30 PM Date of Discharge: 11/10/2015 Attending Physician: Larey Dresser, MD Discharge Diagnosis: 1. GI Bleed 2. Coronary Artery Disease 3. Bradycardia (31-40)  Discharge Medications:   Medication List    STOP taking these medications        azithromycin 250 MG tablet  Commonly known as:  ZITHROMAX     ondansetron 4 MG disintegrating tablet  Commonly known as:  ZOFRAN-ODT     pneumococcal 13-valent conjugate vaccine Susp injection  Commonly known as:  PREVNAR 13     sucralfate 1 G tablet  Commonly known as:  CARAFATE     ZOSTAVAX 13086 UNT/0.65ML injection  Generic drug:  zoster vaccine live (PF)      TAKE these medications        acyclovir 400 MG tablet  Commonly known as:  ZOVIRAX  Take 1 tablet (400 mg total) by mouth daily as needed (for flare ups).     albuterol 108 (90 BASE) MCG/ACT inhaler  Commonly known as:  PROVENTIL HFA;VENTOLIN HFA  Inhale 2 puffs into the lungs every 6 (six) hours as needed for wheezing or shortness of breath.     albuterol (2.5 MG/3ML) 0.083% nebulizer solution  Commonly known as:  PROVENTIL  Take 3 mLs (2.5 mg total) by nebulization every 6 (six) hours as needed for wheezing or shortness of breath.     amLODipine 10 MG tablet  Commonly known as:  NORVASC  Take 1 tablet (10 mg total) by mouth daily.     aspirin 81 MG tablet  Take 1 tablet (81 mg total) by mouth daily.     atorvastatin 40 MG tablet  Commonly known as:  LIPITOR  Take 1 tablet (40 mg total) by mouth daily at 6 PM.     baclofen 10 MG tablet  Commonly known as:  LIORESAL  Take 10 mg by mouth 3 (three) times daily as needed for muscle spasms.     chlorthalidone 25 MG tablet  Commonly known as:  HYGROTON  Take 1 tablet (25 mg total) by mouth daily.     dextromethorphan-guaiFENesin 30-600 MG 12hr tablet  Commonly known as:  MUCINEX DM    Take 1 tablet by mouth 2 (two) times daily.     diazepam 2 MG tablet  Commonly known as:  VALIUM  take 1 tablet by mouth every 6 hours if needed for anxiety     dolutegravir 50 MG tablet  Commonly known as:  TIVICAY  Take 1 tablet (50 mg total) by mouth daily.     emtricitabine-tenofovir AF 200-25 MG tablet  Commonly known as:  DESCOVY  Take 1 tablet by mouth daily.     ENSURE NUTRA SHAKE HI-CAL Liqd  Take 1 Can by mouth 2 (two) times daily.     ferrous sulfate 325 (65 FE) MG tablet  Take 1 tablet (325 mg total) by mouth 3 (three) times daily with meals.     gabapentin 600 MG tablet  Commonly known as:  NEURONTIN  Take 600 mg by mouth 2 (two) times daily.     isosorbide mononitrate 30 MG 24 hr tablet  Commonly known as:  IMDUR  Take 1 tablet (30 mg total) by mouth daily.     lisinopril 40 MG tablet  Commonly known as:  PRINIVIL,ZESTRIL  Take 1 tablet (40 mg total) by mouth daily.     loratadine 10 MG  tablet  Commonly known as:  CLARITIN  Take 1 tablet (10 mg total) by mouth daily as needed for allergies.     mometasone 220 MCG/INH inhaler  Commonly known as:  ASMANEX  Inhale 1 puff into the lungs 2 (two) times daily.     nicotine 14 mg/24hr patch  Commonly known as:  NICODERM CQ - dosed in mg/24 hours  Place 1 patch (14 mg total) onto the skin daily.     nicotine polacrilex 4 MG gum  Commonly known as:  EQ NICOTINE  Take 1 each (4 mg total) by mouth as needed for smoking cessation.     pantoprazole 40 MG tablet  Commonly known as:  PROTONIX  Take 1 tablet (40 mg total) by mouth 2 (two) times daily.     polyethylene glycol packet  Commonly known as:  MIRALAX / GLYCOLAX  Take 17 g by mouth daily as needed for mild constipation or moderate constipation.        Disposition and follow-up:   Ms.Jazzmen Jochim was discharged from [facility] in Good condition.  At the hospital follow up visit please address:  1.  She will need a CBC at her  hospital follow-up with Dr. Melburn Hake on 12/9 to ensure he hemoglobin is stable.   2. In conjunction with cardiology and gastroenterology a plan will need to be formulated for the best sequence of treating her GI bleeding source and RCA stenosis. Likely, further evaluation and treatment of her GI bleeding source (i.e. Push enteroscopy) would precede any coronary intervention as she would need to be on Plavix after stent placement. It does not appear that Dr. Olegario Messier at [facility] is currently accepting referrals for double balloon enteroscopy. This procedure is currently done at [facility] and [facility].  2.  Labs / imaging needed at time of follow-up: CBC  3.  Pending labs/ test needing follow-up: None  Follow-up Appointments:     Follow-up Information    Follow up with Truitt Merle, NP On 11/27/2015.   Specialties:  Nurse Practitioner, Interventional Cardiology, Cardiology, Radiology   Why:  Appointment at 1:30 pm   Contact information:   [address]  [id number] [phone]       Follow up with Cassell Clement, MD On 11/23/2015.   Specialty:  Gastroenterology   Why:  Arrive at 9:15 for 9:30 appointment.   Contact information:   [address] [place] [address] [phone]       Follow up with Loleta Chance, MD On 11/17/2015.   Specialty:  Internal Medicine   Why:  9:15 appt   Contact information:   [place] [id number] [phone]       Discharge Instructions: Discharge Instructions    Diet - low sodium heart healthy    Complete by:  As directed      Increase activity slowly    Complete by:  As directed           [name], it was a pleasure taking care of you in the hospital. As we discussed, you have two main problems: 1) Bleeding in your gut and 2) Narrow arteries in your heart, which may lead to a heart attack. In order to make sure you have the best treatment going forward, it may be best to  address the bleeding problems first, since the stents to treat your narrow heart arteries would require you to be  on a blood thinner called Plavix. Plavix could possibly worsen bleeding. Therefore, the best option going forward is likely to see the source of your bleeding and treat that before the heart doctors treat your heart.  Therefore, I have made appointments with a Cardiologist, a Copywriter, advertising (for your bleeding), and our clinic here at the hospital. Together, a plan will be made over time to work out the best approach for your heart and bleeding problem.   We have restarted you on a weak blood thinner, aspirin, which we you were taking home, which was recommended by the cardiologist.   We added a new medication: Atorvastatin 40 mg daily which helps protect your heart.  If you notice blood in your stool, please return to the hospital. Symptoms of blood loss also include chest pain, shortness of breath, and light-headedness. If you notice severe chest pain that does not improve, please seek medical attention.  Consultations: Treatment Team:  Wonda Horner, MD  Procedures Performed:  Dg Chest Port 1 View  11/07/2015  CLINICAL DATA:  Low hemoglobin and fatigue weakness 1 week EXAM: PORTABLE CHEST 1 VIEW COMPARISON:  09/12/2014 FINDINGS: Stable mild to moderate cardiac enlargement. Vascular pattern normal. No consolidation or effusion. IMPRESSION: Stable cardiac enlargement with no acute findings Electronically Signed   By: Skipper Cliche M.D.   On: 11/07/2015 21:29    2D Echo:   - Left ventricle: The cavity size was normal. There was moderate concentric hypertrophy. Systolic function was hyperdynamic. The estimated ejection fraction was in the range of 70% to 75%. There was dynamic obstruction at restin the mid cavity, with a peak velocity of 240 cm/sec and a peak gradient of 23 mm Hg. Wall motion was normal; there were no regional wall motion abnormalities. Doppler  parameters are consistent with abnormal left ventricular relaxation (grade 1 diastolic dysfunction). Doppler parameters are consistent with elevated mean left atrial filling pressure. - Mitral valve: Calcified annulus. There was mild regurgitation. - Left atrium: The atrium was moderately dilated. - Pulmonary arteries: PA peak pressure: 32 mm Hg (S).  Impressions:  - No change from April 2015 study.  Cardiac Cath:    Prox RCA to Mid RCA lesion, 80% stenosed.  Mid RCA lesion, 75% stenosed.  Mid Cx lesion, 50% stenosed.  Mid LAD lesion, 10% stenosed.  There is moderate left ventricular systolic dysfunction with distal inferior and apical hypokinesis.  RPDA-severely diseased, 95% stenosed.  Complex, calcific RCA disease. The distal PDA is small and diffusely diseased. Her circumflex is codominant. I think medical therapy is the best treatemnt at this time given her issues with GI bleeding. Rotational atherectomy would be needed and high risk given the tortuousity.   Admission HPI:   Ms. Chevis is a 69 year old African American lady with history of gastrointestinal bleed suspected to be from arteriovenous malformations, gastroesophageal reflux disease, well-controlled HIV, hypertension, tobacco abuse, coronary artery disease, and hepatitis C antibody positive presenting with lightheadedness on exertion, epigastric abdominal tenderness, lethargy, and nausea.  Her symptoms all started last week and have been getting progressively worse. She denies any change in her stool color or blood in her stool; she says it's been the same dark color since she started taking iron pills last year. She says she sometimes tastes blood in the back of her throat but denies any hematemesis. She has epigastric abdominal pain that is only present when someone touches it, but denies any pain elsewhere. Sometimes she has chest pain on exertion that has  been slightly worse over the last 2 weeks but  denies any symptoms at rest. She takes Tylenol and aspirin 81mg  daily but denies any other NSAIDs, and she's been taking Protonix 40mg  daily as prescribed.  In October 2015, she presented with another gastrointestinal bleed; at that time, a colonoscopy was unrevealing but endoscopy showed small AVMs in the proximal small bowel and erosions. A pill enteroscopy showed AVMs in her proximal small bowel as well, so it was suspected AVMs were contributing to her bleed. She was supposed to follow-up with Gastroenterology, with possible referral for double-balloon enteroscopy at [facility], but she did not keep the appointment.  In the emergency department, her blood pressure was 150/50, pulse 55, saturating 100% on room air, afebrile. Her hemoglobin was 7.2 with an MCV of 90, down from 9.1 last week. Her BUN was 25, creatinine 1.26, above over baseline of less than 1. A fecal occult blood test was positive. She was transfused 2 units of PRBCs and started on pantoprazole 40mg  IV twice daily in the emergency department and admitted to IMTS.  Hospital Course by problem list: Principal Problem:   GI bleed Active Problems:   Human immunodeficiency virus (HIV) disease ([place])   TOBACCO ABUSE   Gastroesophageal reflux disease   Essential hypertension, benign   Iron deficiency anemia   Anal condylomata   Hepatitis C   Anxiety   Asthma, chronic   Pain in the chest   Heart block   Iron deficiency anemia due to chronic blood loss   Bradycardia with 31 - 40 beats per minute   GI Bleed: Gastroenterology was consulted and did not recommend further workup as an inpatient given that she has known AVMs in her small bowel and had extensive workup in 2015 (EGD, colonoscopy, pill endoscopy). Given that she was symptomatic with light-headedness, she was transfused with 2U pRBCs and her hemoglobin improved from 7.8 to 10.2 by day of discharge. Concurrently, she improved symptomatically with diminished light-headedness and  epigastric pain over time. She was able to walk around the room without becoming light-headed by day of discharge. Gastroenterology recommended outpatient follow-up with Dr. Olegario Messier for evaluation for a double-balloon enteroscopy; however, it does not appear he is currently accepting referrals for this procedure at this time. The patient had no additional bleeding episodes, and had one large, non-melenic, formed bowel movement without hematochezia prior to discharge.  Coronary Artery Disease: Cardiac catheterization was performed on 11/30, revealing 80% stenosis in the RCA (see remainder of results above). Stenting was deferred in setting of recent GI bleed, given that she would have to be on Plavix post-stenting. Dr. Ena Dawley, cardiologist, re-assessed her on day of discharge and recommended medical management, 81 mg aspirin po daily as well as 30 mg imdur po daily. The patient expressed understanding that if she were to have recurrent bleeding with symptoms, that she should return to the hospital.     Sinus Bradycardia:  During her hospitalization, Ms. Capuchino was incidentally noted to have intermittent bradycardia to the 30-40s. Concurrently, she was noted to be light-headed, although she was also recovering from a GI bleed at the time. Electrophysiology evaluated her on 12/1 and surmised that her symptoms were likely due to her anemia and not bradycardia. She was also noted to have a chronic asymptomatic bradycardia on chart review. It was recommended that if her symptoms persisted despite improvement in anemia, she could be considered a candidate for outpatient evaluation for pacing. Her symptoms of light-headedness had resolved  and her HR remained 50-80 in the 24 hours prior to discharge.  Iron Deficiency Anemia: Iron panel suggestive of underlying iron-deficiency anemia with high TIBC. Patient received Fereheme transfusion on 12/1. Patient discharged on home ferrous sulfate.   Questionable  Congestive Heart Failure (EF 35-45% vs 70-75%): Discrepancy between cath (35-45%) and TTE (70-75%). She didnot appear volume overloaded and has not had recent shortness of breath. No crackles on exam. This will need to be reconsidered as outpatient.    Human immunodeficiency virus: Her last CD4 was 550 with an undetectable viral load. She's compliant with her anti-retrovirals. Continued home Tivicay and Descovy as an inpatient.  Chronic back pain: Not an acute issue. Continued home acetaminophen 650mg  every 6 hours for pain,  gabapentin 600mg  twice daily, baclofen 10mg  three times daily as needed for muscle spasms  Hypertension: BP stable during hospitalization. Home BP meds held initially in setting of GI bleed. Restarted home imdur 30mg  and lisinopril 40mg  by day of discharge. Discontinued home chlorthalidone 25mg   Anxiety: Continued home diazepam 2mg  every 6 hours as needed for anxiety  Viral URI: Noted to have rhinorrhea and chest congestion on 12/1 that was effectively managed with DXM-guaifenesin.  Chronic obstructive pulmonary disease: Stable as inpatient, not requiring oxygen. Continued home mometasone inhaler twice daily and albuterol nebulizers every 6 hours as needed for wheezing.   Discharge Vitals:   BP 138/40 mmHg  Pulse 54  Temp(Src) 98.7 F (37.1 C) (Oral)  Resp 16  Ht 5\' 2"  (1.575 m)  Wt 131 lb 8 oz (59.648 kg)  BMI 24.05 kg/m2  SpO2 100%  Discharge Labs:  Results for orders placed or performed during the hospital encounter of 11/07/15 (from the past 24 hour(s))  CBC     Status: Abnormal   Collection Time: 11/10/15  4:44 AM  Result Value Ref Range   WBC 5.5 4.0 - 10.5 K/uL   RBC 3.65 (L) 3.87 - 5.11 MIL/uL   Hemoglobin 10.2 (L) 12.0 - 15.0 g/dL   HCT 33.1 (L) 36.0 - 46.0 %   MCV 90.7 78.0 - 100.0 fL   MCH 27.9 26.0 - 34.0 pg   MCHC 30.8 30.0 - 36.0 g/dL   RDW 17.1 (H) 11.5 - 15.5 %   Platelets 273 150 - 400 K/uL  Basic metabolic panel     Status: Abnormal    Collection Time: 11/10/15  4:44 AM  Result Value Ref Range   Sodium 137 135 - 145 mmol/L   Potassium 4.3 3.5 - 5.1 mmol/L   Chloride 104 101 - 111 mmol/L   CO2 28 22 - 32 mmol/L   Glucose, Bld 101 (H) 65 - 99 mg/dL   BUN 16 6 - 20 mg/dL   Creatinine, Ser 0.85 0.44 - 1.00 mg/dL   Calcium 8.9 8.9 - 10.3 mg/dL   GFR calc non Af Amer >60 >60 mL/min   GFR calc Af Amer >60 >60 mL/min   Anion gap 5 5 - 15    Signed: Liberty Handy, MD 11/10/2015, 6:08 PM

## 2015-11-11 ENCOUNTER — Other Ambulatory Visit: Payer: Self-pay | Admitting: Internal Medicine

## 2015-11-13 ENCOUNTER — Other Ambulatory Visit: Payer: Self-pay | Admitting: Internal Medicine

## 2015-11-16 ENCOUNTER — Other Ambulatory Visit: Payer: Self-pay | Admitting: *Deleted

## 2015-11-17 ENCOUNTER — Encounter: Payer: Self-pay | Admitting: Internal Medicine

## 2015-11-17 ENCOUNTER — Ambulatory Visit (INDEPENDENT_AMBULATORY_CARE_PROVIDER_SITE_OTHER): Payer: Medicare Other | Admitting: Internal Medicine

## 2015-11-17 VITALS — BP 179/56 | HR 84 | Temp 98.1°F | Ht 62.0 in | Wt 137.1 lb

## 2015-11-17 DIAGNOSIS — D500 Iron deficiency anemia secondary to blood loss (chronic): Secondary | ICD-10-CM | POA: Diagnosis not present

## 2015-11-17 DIAGNOSIS — F17200 Nicotine dependence, unspecified, uncomplicated: Secondary | ICD-10-CM

## 2015-11-17 MED ORDER — NICOTINE POLACRILEX 4 MG MT GUM: 4.0000 mg | CHEWING_GUM | OROMUCOSAL | Status: DC | PRN

## 2015-11-17 NOTE — Assessment & Plan Note (Addendum)
Mr. Rees was recently discharged from the hospital for gastrointestinal bleed thought to be due to arteriovenous malformations. At that hospitalization, she underwent a left heart cath that showed 80% RCA stenosis. She was also bradycardic to the 50s, likely from her RCA disease. Moreover, it's difficult to say whether her lightheadedness on exertion is from her chronic iron deficiency anemia or symptomatic bradycardia.   Since hospitalization, she denies any lightheadedness, or recurrence of her gastrointestinal bleed. She is requesting to go back to work, which I agreed is a safe idea. I'll check a CBC today and will call her with the results.   Regarding her preoperative evaluation, her RCR I score is 1 for known coronary artery disease. She has a MET of less than 4, however, as she can walk about 100 yards before feeling lightheaded. In my personal opinion I believe that her cardiology and gastroenterology evaluations and conversations will be prudent to direct the viability and order of possible pacemaker implantation, coronary stenting with subsequent anticoagulation, and double balloon enteroscopy to cauterize the arteriovenous malformations. 

## 2015-11-17 NOTE — Assessment & Plan Note (Signed)
She requested a nicotine gum prescription which I provided today.

## 2015-11-17 NOTE — Progress Notes (Signed)
Internal Medicine Clinic Attending  I saw and evaluated the patient.  I personally confirmed the key portions of the history and exam documented by Dr. Melburn Hake and I reviewed pertinent patient test results.  The assessment, diagnosis, and plan were formulated together and I agree with the documentation in the resident's note.  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 She needs to go for advanced endoscopy at [facility] to potentially correct the angioectasia. She does have moderate ischemic heart disease, but I would recommend against PCI and dual antiplatelet therapy prior to correcting the source of GI bleeding. She will follow with both cardiology and GI medicine to coordinate the timing of these procedures.

## 2015-11-17 NOTE — Progress Notes (Signed)
Patient ID: Nicole Mccormick, female   DOB: 11-17-1946, 69 y.o.   MRN: [id number]   Subjective:   Patient ID: Nicole Mccormick female   DOB: [birth year] 69 y.o.   MRN: [id number]  HPI: [name] is a 69 y.o. lady with a history of coronary artery disease and gastrointestinal bleed thought to be due from arteriovenous malformations, presenting for hospital follow-up from a recent gastrointestinal bleed.  Since her discharge, she tells me she is been feeling very well, denying any lightheadedness when she walks, chest pain on exertion, or new blood in her stool. She is requesting a note to return to work and a prescription for nicotine gum.  She has a complex medical history;  gastroenterology would like to refer [facility] to [facility] for a double balloon enteroscopy to cauterize her AVMs. However, she has significant stenosis in the right coronary artery, and questionable symptomatic bradycardia. She has a follow-up appointment with the cardiologist and gastroenterologist in January.  Past Medical History  Diagnosis Date  . Hypertension   . Abnormal Pap smear   . Asthma   . Coronary artery disease   . HIV infection ([place])     diagnosed before 2008  . Varicosities   . Seizures ([place])   . GERD (gastroesophageal reflux disease)     previously on aciphex, discontinued november 2012  because patient asymptomatic, and concern about interference with HIV meds.  May try pepcid in the future if symptoms return  . Iron deficiency anemia     Ferritin = 2 in november 2012, started on iron supplemenation  . Arthritis   . CHF (congestive heart failure) ([place])   . Heart murmur   . Hepatitis C antibody test positive   . Complication of anesthesia     "they have a hard time bringing me back" (11/08/2015)   Current Outpatient Prescriptions  Medication Sig Dispense Refill  . acyclovir (ZOVIRAX) 400 MG tablet Take 1 tablet (400 mg total) by mouth daily as needed (for flare ups). 30 tablet 3  . albuterol (PROVENTIL)  (2.5 MG/3ML) 0.083% nebulizer solution Take 3 mLs (2.5 mg total) by nebulization every 6 (six) hours as needed for wheezing or shortness of breath. 75 mL 12  . amLODipine (NORVASC) 10 MG tablet Take 1 tablet (10 mg total) by mouth daily. 90 tablet 3  . aspirin 81 MG tablet Take 1 tablet (81 mg total) by mouth daily. 30 tablet 4  . atorvastatin (LIPITOR) 40 MG tablet Take 1 tablet (40 mg total) by mouth daily at 6 PM. 30 tablet 4  . baclofen (LIORESAL) 10 MG tablet Take 10 mg by mouth 3 (three) times daily as needed for muscle spasms.    . baclofen (LIORESAL) 20 MG tablet take 1 tablet by mouth three times a day 90 tablet 0  . chlorthalidone (HYGROTON) 25 MG tablet Take 1 tablet (25 mg total) by mouth daily. 90 tablet 3  . dextromethorphan-guaiFENesin (MUCINEX DM) 30-600 MG 12hr tablet Take 1 tablet by mouth 2 (two) times daily. 30 tablet 0  . diazepam (VALIUM) 2 MG tablet take 1 tablet by mouth every 6 hours if needed for anxiety 30 tablet 0  . dolutegravir (TIVICAY) 50 MG tablet Take 1 tablet (50 mg total) by mouth daily. 90 tablet 3  . emtricitabine-tenofovir AF (DESCOVY) 200-25 MG tablet Take 1 tablet by mouth daily. 90 tablet 3  . ferrous sulfate 325 (65 FE) MG tablet Take 1 tablet (325 mg total) by mouth 3 (three) times daily  with meals. 90 tablet 3  . gabapentin (NEURONTIN) 600 MG tablet Take 600 mg by mouth 2 (two) times daily.    . isosorbide mononitrate (IMDUR) 30 MG 24 hr tablet Take 1 tablet (30 mg total) by mouth daily. 90 tablet 3  . lisinopril (PRINIVIL,ZESTRIL) 40 MG tablet Take 1 tablet (40 mg total) by mouth daily. 90 tablet 3  . loratadine (CLARITIN) 10 MG tablet Take 1 tablet (10 mg total) by mouth daily as needed for allergies. 90 tablet 3  . mometasone (ASMANEX) 220 MCG/INH inhaler Inhale 1 puff into the lungs 2 (two) times daily. 1 Inhaler 2  . nicotine (NICODERM CQ - DOSED IN MG/24 HOURS) 14 mg/24hr patch Place 1 patch (14 mg total) onto the skin daily. 28 patch 0  . nicotine  polacrilex (EQ NICOTINE) 4 MG gum Take 1 each (4 mg total) by mouth as needed for smoking cessation. 100 tablet 0  . Nutritional Supplements (ENSURE NUTRA SHAKE HI-CAL) LIQD Take 1 Can by mouth 2 (two) times daily. 60 Can 11  . pantoprazole (PROTONIX) 40 MG tablet Take 1 tablet (40 mg total) by mouth 2 (two) times daily. 180 tablet 3  . polyethylene glycol (MIRALAX / GLYCOLAX) packet Take 17 g by mouth daily as needed for mild constipation or moderate constipation. 14 each 0  . PROAIR HFA 108 (90 BASE) MCG/ACT inhaler inhale 2 puffs by mouth every 6 hours if needed for wheezing or shortness of breath 8.5 Inhaler 5   No current facility-administered medications for this visit.   Family History  Problem Relation Age of Onset  . Diabetes Mother   . Ovarian cancer Sister   . Cancer Sister     ovarian and colon  . Colon cancer Neg Hx    Social History   Social History  . Marital Status: Married    Spouse Name: N/A  . Number of Children: N/A  . Years of Education: N/A   Social History Main Topics  . Smoking status: Current Every Day Smoker -- 0.75 packs/day for 50 years    Types: Cigarettes  . Smokeless tobacco: Never Used     Comment: Wants to quit.  . Alcohol Use: 6.0 oz/week    10 Glasses of wine per week     Comment: Wine.  . Drug Use: No  . Sexual Activity: Not on file     Comment: pt. given condoms   Other Topics Concern  . Not on file   Social History Narrative   Review of Systems  Constitutional: Negative for malaise/fatigue.  Eyes: Negative for blurred vision and double vision.  Cardiovascular: Positive for palpitations. Negative for chest pain, orthopnea and claudication.  Gastrointestinal: Negative for heartburn, nausea, vomiting, abdominal pain, blood in stool and melena.  Neurological: Negative for dizziness and loss of consciousness.   Objective:  Physical Exam: Filed Vitals:   11/17/15 0926  BP: 179/56  Pulse: 84  Temp: 98.1 F (36.7 C)  TempSrc: Oral    Height: 5\' 2"  (1.575 m)  Weight: 137 lb 1.6 oz (62.188 kg)  SpO2: 100%   General: resting in bed comfortably, appropriately conversational HEENT: no scleral icterus, extra-ocular muscles intact, pale conjuctiva Cardiac: Bradycardic with 2/6 systolic ejection murmur, normal orthostatics Pulm: breathing well, clear to auscultation bilaterally Abd: bowel sounds normal, soft, nondistended, non-tender Ext: warm and well perfused, without pedal edema Lymph: no cervical or supraclavicular lymphadenopathy Skin: no rash, hair, or nail changes Neuro: alert and oriented X3, cranial nerves II-XII grossly  intact, moving all extremities well  Assessment & Plan:   Please see problem-based charting for my assessment and plan.

## 2015-11-17 NOTE — Addendum Note (Signed)
Addended by: Lalla Brothers T on: 11/17/2015 02:43 PM   Modules accepted: Level of Service

## 2015-11-17 NOTE — Patient Instructions (Addendum)
Miss Pruneau,  It was a pleasure to see you again. I'm glad you're doing so well, and your gastrointestinal bleed seems to have slowed down.  I'll be in touch with her gastroenterologist and new cardiologist to try to coordinate which procedures we should do first.  I have written you a prescription for the nicotine gum, given your work note, we've checked her blood level today. I'll give you a call when we get the results later today or possibly tomorrow.  Please schedule an appointment with [place] sometime after your cardiology visit.  Don't hesitate to let us know if there is anything else we can do to help you, and I hope you have a most Merry Christmas, Dr. Melburn Hake

## 2015-11-18 LAB — CBC
Hematocrit: 33.7 % — ABNORMAL LOW (ref 34.0–46.6)
Hemoglobin: 10.6 g/dL — ABNORMAL LOW (ref 11.1–15.9)
MCH: 29.6 pg (ref 26.6–33.0)
MCHC: 31.5 g/dL (ref 31.5–35.7)
MCV: 94 fL (ref 79–97)
Platelets: 267 10*3/uL (ref 150–379)
RBC: 3.58 x10E6/uL — ABNORMAL LOW (ref 3.77–5.28)
RDW: 20.5 % — ABNORMAL HIGH (ref 12.3–15.4)
WBC: 6.3 10*3/uL (ref 3.4–10.8)

## 2015-11-20 ENCOUNTER — Telehealth: Payer: Self-pay | Admitting: Infectious Diseases

## 2015-11-20 NOTE — Telephone Encounter (Signed)
Called and left voice mail for [facility] at [place] stating we do not have in our records that patient uses this pharmacy. Myrtis Hopping

## 2015-11-20 NOTE — Telephone Encounter (Signed)
Needs to clarify which rx patient needs a refill on, descovy or truvada? (did not see truvada on med list)

## 2015-11-23 DIAGNOSIS — D500 Iron deficiency anemia secondary to blood loss (chronic): Secondary | ICD-10-CM | POA: Diagnosis not present

## 2015-11-27 ENCOUNTER — Ambulatory Visit: Payer: Medicare Other | Admitting: Infectious Diseases

## 2015-11-27 ENCOUNTER — Encounter: Payer: Self-pay | Admitting: Nurse Practitioner

## 2015-11-27 ENCOUNTER — Ambulatory Visit (INDEPENDENT_AMBULATORY_CARE_PROVIDER_SITE_OTHER): Payer: Medicare Other | Admitting: Nurse Practitioner

## 2015-11-27 VITALS — BP 160/68 | HR 50 | Ht 62.0 in | Wt 140.8 lb

## 2015-11-27 DIAGNOSIS — I10 Essential (primary) hypertension: Secondary | ICD-10-CM | POA: Diagnosis not present

## 2015-11-27 DIAGNOSIS — I5032 Chronic diastolic (congestive) heart failure: Secondary | ICD-10-CM | POA: Diagnosis not present

## 2015-11-27 DIAGNOSIS — R079 Chest pain, unspecified: Secondary | ICD-10-CM | POA: Diagnosis not present

## 2015-11-27 DIAGNOSIS — D509 Iron deficiency anemia, unspecified: Secondary | ICD-10-CM | POA: Diagnosis not present

## 2015-11-27 LAB — BASIC METABOLIC PANEL
BUN: 15 mg/dL (ref 7–25)
CO2: 29 mmol/L (ref 20–31)
Calcium: 8.9 mg/dL (ref 8.6–10.4)
Chloride: 106 mmol/L (ref 98–110)
Creat: 0.86 mg/dL (ref 0.60–0.93)
Glucose, Bld: 86 mg/dL (ref 65–99)
Potassium: 3.7 mmol/L (ref 3.5–5.3)
Sodium: 141 mmol/L (ref 135–146)

## 2015-11-27 LAB — CBC
HCT: 36.4 % (ref 36.0–46.0)
Hemoglobin: 11.7 g/dL — ABNORMAL LOW (ref 12.0–15.0)
MCH: 30.9 pg (ref 26.0–34.0)
MCHC: 32.1 g/dL (ref 30.0–36.0)
MCV: 96 fL (ref 78.0–100.0)
MPV: 9.9 fL (ref 8.6–12.4)
Platelets: 272 10*3/uL (ref 150–400)
RBC: 3.79 MIL/uL — ABNORMAL LOW (ref 3.87–5.11)
RDW: 21.9 % — ABNORMAL HIGH (ref 11.5–15.5)
WBC: 6.2 10*3/uL (ref 4.0–10.5)

## 2015-11-27 LAB — MAGNESIUM: Magnesium: 2.2 mg/dL (ref 1.5–2.5)

## 2015-11-27 NOTE — Patient Instructions (Addendum)
We will be checking the following labs today - BMET, CBC and MG   Medication Instructions:    Continue with your current medicines.     Testing/Procedures To Be Arranged:  N/A  Follow-Up:   See Dr. Meda Coffee in about 6 to 8 weeks    Other Special Instructions:   Keep that New Year's Resolution!!    If you need a refill on your cardiac medications before your next appointment, please call your pharmacy.   Call the [place] office at [phone] if you have any questions, problems or concerns.

## 2015-11-27 NOTE — Progress Notes (Signed)
CARDIOLOGY OFFICE NOTE  Date:  11/27/2015    [name] Date of Birth: 17-Apr-1946 Medical Record [id number]  PCP:  Shela Leff, MD  Cardiologist:  Meda Coffee    Chief Complaint  Patient presents with  . Coronary Artery Disease    Post hospital visit - seen for Dr. Meda Coffee    History of Present Illness: Sulamita Nash is a 69 y.o. female who presents today for a post hospital visit. Seen for Dr. Meda Coffee.   She has a past medical history significant for HIV, GERD, diastolic heart failure, AVM's with chronic anemia, and hypertension. She also has chronic bradycardia that has been asymptomatic for the most part.   She presented to the hospital earlier this month with increased fatigue and shortness of breath on exertion. Catheterization demonstrated complex calcific RCA disease, distal PDA with diffuse disease. Medical therapy was recommended. EP saw as well for bradycardia - recommended GI workup and correcting anemia and if symptoms of lightheadedness and fatigue persist despite normal CBC, then consider PPM implant.   Comes back today. Here alone. She is following with [facility] - Dr. Penelope Coop- no more testing planned and she is currently not bleeding. Chest pain has improved - she is not short of breath. Back to work. She continues to smoke but says it is her "New Years Resolution" to stop. She is happy with how she feels. Rare flutter in her chest - not really dizzy or lightheaded anymore. No syncope reported.   Past Medical History  Diagnosis Date  . Hypertension   . Abnormal Pap smear   . Asthma   . Coronary artery disease   . HIV infection ([place])     diagnosed before 2008  . Varicosities   . Seizures ([place])   . GERD (gastroesophageal reflux disease)     previously on aciphex, discontinued november 2012  because patient asymptomatic, and concern about interference with HIV meds.  May try pepcid in the future if symptoms return  . Iron deficiency anemia     Ferritin = 2 in  november 2012, started on iron supplemenation  . Arthritis   . CHF (congestive heart failure) ([place])   . Heart murmur   . Hepatitis C antibody test positive   . Complication of anesthesia     "they have a hard time bringing me back" (11/08/2015)    Past Surgical History  Procedure Laterality Date  . Abdominal hysterectomy    . Esophagogastroduodenoscopy  10/13/11    small hiatal hernia  . Colonoscopy  10/13/11    small adenoma, anal condyloma  . Colonoscopy  10/13/2011    Procedure: COLONOSCOPY;  Surgeon: Gatha Mayer, MD;  Location: [place] [facility];  Service: Endoscopy;  Laterality: N/A;  . Esophagogastroduodenoscopy  10/13/2011    Procedure: ESOPHAGOGASTRODUODENOSCOPY (EGD);  Surgeon: Gatha Mayer, MD;  Location: [facility] ENDOSCOPY;  Service: Endoscopy;  Laterality: N/A;  . Esophagogastroduodenoscopy N/A 09/14/2014    Procedure: ESOPHAGOGASTRODUODENOSCOPY (EGD);  Surgeon: Arta Silence, MD;  Location: [facility] END[facility]COPY;  Service: Endoscopy;  Laterality: N/A;  . Colonoscopy N/A 09/14/2014    Procedure: COLONOSCOPY;  Surgeon: Arta Silence, MD;  Location: [facility] ENDOSCOPY;  Service: Endoscopy;  Laterality: N/A;  . Givens capsule study N/A 09/14/2014    Procedure: GIVENS CAPSULE STUDY;  Surgeon: Arta Silence, MD;  Location: [facility] ENDOSCOPY;  Service: Endoscopy;  Laterality: N/A;  . Cardiac catheterization N/A 11/08/2015    Procedure: Left Heart Cath and Coronary Angiography;  Surgeon: Jettie Booze, MD;  Location:  [facility] INVASIVE CV LAB;  Service: Cardiovascular;  Laterality: N/A;     Medications: Current Outpatient Prescriptions  Medication Sig Dispense Refill  . acyclovir (ZOVIRAX) 400 MG tablet Take 1 tablet (400 mg total) by mouth daily as needed (for flare ups). 30 tablet 3  . albuterol (PROVENTIL) (2.5 MG/3ML) 0.083% nebulizer solution Take 3 mLs (2.5 mg total) by nebulization every 6 (six) hours as needed for wheezing or shortness of breath. 75 mL 12  . amLODipine (NORVASC) 10 MG tablet Take  10 mg by mouth daily.  0  . aspirin 81 MG tablet Take 1 tablet (81 mg total) by mouth daily. 30 tablet 4  . atorvastatin (LIPITOR) 40 MG tablet Take 1 tablet (40 mg total) by mouth daily at 6 PM. 30 tablet 4  . baclofen (LIORESAL) 10 MG tablet Take 10 mg by mouth 3 (three) times daily as needed for muscle spasms.    . baclofen (LIORESAL) 20 MG tablet take 1 tablet by mouth three times a day 90 tablet 0  . chlorthalidone (HYGROTON) 25 MG tablet Take 1 tablet (25 mg total) by mouth daily. 90 tablet 3  . dextromethorphan-guaiFENesin (MUCINEX DM) 30-600 MG 12hr tablet Take 1 tablet by mouth 2 (two) times daily. 30 tablet 0  . diazepam (VALIUM) 2 MG tablet take 1 tablet by mouth every 6 hours if needed for anxiety 30 tablet 0  . dolutegravir (TIVICAY) 50 MG tablet Take 1 tablet (50 mg total) by mouth daily. 90 tablet 3  . emtricitabine-tenofovir AF (DESCOVY) 200-25 MG tablet Take 1 tablet by mouth daily. 90 tablet 3  . ferrous sulfate 325 (65 FE) MG tablet Take 1 tablet (325 mg total) by mouth 3 (three) times daily with meals. 90 tablet 3  . gabapentin (NEURONTIN) 600 MG tablet Take 600 mg by mouth 2 (two) times daily.    . isosorbide mononitrate (IMDUR) 30 MG 24 hr tablet Take 1 tablet (30 mg total) by mouth daily. 90 tablet 3  . lisinopril (PRINIVIL,ZESTRIL) 40 MG tablet Take 1 tablet (40 mg total) by mouth daily. 90 tablet 3  . loratadine (CLARITIN) 10 MG tablet Take 1 tablet (10 mg total) by mouth daily as needed for allergies. 90 tablet 3  . mometasone (ASMANEX) 220 MCG/INH inhaler Inhale 1 puff into the lungs 2 (two) times daily. 1 Inhaler 2  . Nutritional Supplements (ENSURE NUTRA SHAKE HI-CAL) LIQD Take 1 Can by mouth 2 (two) times daily. 60 Can 11  . ondansetron (ZOFRAN-ODT) 4 MG disintegrating tablet Take 1 tablet by mouth daily as needed for nausea or vomiting.   0  . pantoprazole (PROTONIX) 40 MG tablet Take 1 tablet (40 mg total) by mouth 2 (two) times daily. 180 tablet 3  . polyethylene  glycol (MIRALAX / GLYCOLAX) packet Take 17 g by mouth daily as needed for mild constipation or moderate constipation. 14 each 0  . PROAIR HFA 108 (90 BASE) MCG/ACT inhaler inhale 2 puffs by mouth every 6 hours if needed for wheezing or shortness of breath 8.5 Inhaler 5  . TRUVADA 200-300 MG tablet Take 1 tablet by mouth daily.     No current facility-administered medications for this visit.    Allergies: Allergies  Allergen Reactions  . Penicillins     REACTION: GOES INTO SHOCK & THEN PASSES OU Has patient had a PCN reaction causing immediate rash, facial/tongue/throat swelling, SOB or lightheadedness with hypotension: NO Has patient had a PCN reaction causing severe rash involving mucus membranes or skin  necrosis: NO Has patient had a PCN reaction that required hospitalization NO Has patient had a PCN reaction occurring within the last 10 years: NO If all of the above answers are "NO", then may proceed with Cephalosporin use.   . Avelox [Moxifloxacin Hcl In Nacl] Itching and Rash    Social History: The patient  reports that she has been smoking Cigarettes.  She has a 37.5 pack-year smoking history. She has never used smokeless tobacco. She reports that she drinks about 6.0 oz of alcohol per week. She reports that she does not use illicit drugs.   Family History: The patient's family history includes Cancer in her sister; Diabetes in her mother; Ovarian cancer in her sister. There is no history of Colon cancer.   Review of Systems: Please see the history of present illness.   Otherwise, the review of systems is positive for cramps in the right leg - has with movement .   All other systems are reviewed and negative.   Physical Exam: VS:  BP 160/68 mmHg  Pulse 50  Ht 5\' 2"  (1.575 m)  Wt 140 lb 12.8 oz (63.866 kg)  BMI 25.75 kg/m2  SpO2 98% .  BMI Body mass index is 25.75 kg/(m^2).  Wt Readings from Last 3 Encounters:  11/27/15 140 lb 12.8 oz (63.866 kg)  11/17/15 137 lb 1.6 oz  (62.188 kg)  11/10/15 131 lb 8 oz (59.648 kg)   BP is 150/70 by me.   General: Pleasant. Well developed, well nourished and in no acute distress. She smells of tobacco.  HEENT: Normal. Neck: Supple, no JVD, carotid bruits, or masses noted.  Cardiac: Regular rate and rhythm. Outflow murmur noted. No edema.  Respiratory:  Lungs are clear to auscultation bilaterally with normal work of breathing.  GI: Soft and nontender.  MS: No deformity or atrophy. Gait and ROM intact. Skin: Warm and dry. Color is normal.  Neuro:  Strength and sensation are intact and no gross focal deficits noted.  Psych: Alert, appropriate and with normal affect.   LABORATORY DATA:  EKG:  EKG is not ordered today.  Lab Results  Component Value Date   WBC 6.3 11/17/2015   HGB 10.2* 11/10/2015   HCT 33.7* 11/17/2015   PLT 273 11/10/2015   GLUCOSE 101* 11/10/2015   CHOL 142 01/26/2015   TRIG 59 01/26/2015   HDL 64 01/26/2015   LDLCALC 66 01/26/2015   ALT 11* 11/07/2015   AST 19 11/07/2015   NA 137 11/10/2015   K 4.3 11/10/2015   CL 104 11/10/2015   CREATININE 0.85 11/10/2015   BUN 16 11/10/2015   CO2 28 11/10/2015   TSH 0.771 03/08/2014   INR 1.16 09/12/2014    BNP (last 3 results) No results for input(s): BNP in the last 8760 hours.  ProBNP (last 3 results) No results for input(s): PROBNP in the last 8760 hours.   Other Studies Reviewed Today: Cardiac Cath Conclusion from 11/08/2015     Prox RCA to Mid RCA lesion, 80% stenosed.  Mid RCA lesion, 75% stenosed.  Mid Cx lesion, 50% stenosed.  Mid LAD lesion, 10% stenosed.  There is moderate left ventricular systolic dysfunction with distal inferior and apical hypokinesis.  RPDA-severely diseased, 95% stenosed.  Complex, calcific RCA disease. The distal PDA is small and diffusely diseased. Her circumflex is codominant. I think medical therapy is the best treatemnt at this time given her issues with GI bleeding. Rotational atherectomy  would be needed and high risk given the  tortuousity.   Further cardiac plan per Dr. Meda Coffee and the inpatient team.   Echo Study Conclusions from 11/2015  - Left ventricle: The cavity size was normal. There was moderate concentric hypertrophy. Systolic function was hyperdynamic. The estimated ejection fraction was in the range of 70% to 75%. There was dynamic obstruction at restin the mid cavity, with a peak velocity of 240 cm/sec and a peak gradient of 23 mm Hg. Wall motion was normal; there were no regional wall motion abnormalities. Doppler parameters are consistent with abnormal left ventricular relaxation (grade 1 diastolic dysfunction). Doppler parameters are consistent with elevated mean left atrial filling pressure. - Mitral valve: Calcified annulus. There was mild regurgitation. - Left atrium: The atrium was moderately dilated. - Pulmonary arteries: PA peak pressure: 32 mm Hg (S).  Impressions:  - No change from April 2015 study.   Assessment/Plan: 1. Chest pain - known CAD - to manage medically - she is improved clinically - I have left her on her current regimen for now. It will be imperative to keep her counts up to reduce her symptoms. CV risk factor modification. Smoking cessation imperative.   2. Tobacco abuse - needs to stop. Encouraged.  3. HTN - BP fair  4. Anemia/GI bleed - followed by [facility] - rechecking her labs today.   5. Chronic bradycardia - I do not see indication for PPM at this time. She has follow up with Chanetta Marshall, NP in early January - I think we can hold off on this for now.   6. Cramps - recheck potassium and MG level today.   Current medicines are reviewed with the patient today.  The patient does not have concerns regarding medicines other than what has been noted above.  The following changes have been made:  See above.  Labs/ tests ordered today include:    Orders Placed This Encounter  Procedures  . Basic  metabolic panel  . Magnesium  . CBC     Disposition:   FU with Dr. Meda Coffee in 2 months.   Patient is agreeable to this plan and will call if any problems develop in the interim.   Signed: Burtis Junes, RN, ANP-C 11/27/2015 1:52 PM  [facility] [address] [place], [place]  [address] Phone: [phone] Fax: [phone]

## 2015-11-29 ENCOUNTER — Other Ambulatory Visit: Payer: Self-pay | Admitting: Internal Medicine

## 2015-12-08 ENCOUNTER — Other Ambulatory Visit: Payer: Self-pay | Admitting: Internal Medicine

## 2015-12-12 MED ORDER — NICOTINE 7 MG/24HR TD PT24: 7.0000 mg | MEDICATED_PATCH | TRANSDERMAL | Status: DC

## 2015-12-14 ENCOUNTER — Encounter: Payer: Medicare Other | Admitting: Nurse Practitioner

## 2015-12-15 ENCOUNTER — Ambulatory Visit (INDEPENDENT_AMBULATORY_CARE_PROVIDER_SITE_OTHER): Payer: Medicare Other | Admitting: Internal Medicine

## 2015-12-15 ENCOUNTER — Encounter: Payer: Self-pay | Admitting: Internal Medicine

## 2015-12-15 VITALS — BP 158/65 | HR 60 | Temp 98.0°F | Ht 62.0 in | Wt 133.5 lb

## 2015-12-15 DIAGNOSIS — J4520 Mild intermittent asthma, uncomplicated: Secondary | ICD-10-CM | POA: Diagnosis not present

## 2015-12-15 DIAGNOSIS — F17210 Nicotine dependence, cigarettes, uncomplicated: Secondary | ICD-10-CM | POA: Diagnosis not present

## 2015-12-15 DIAGNOSIS — G5751 Tarsal tunnel syndrome, right lower limb: Secondary | ICD-10-CM | POA: Insufficient documentation

## 2015-12-15 DIAGNOSIS — Z7951 Long term (current) use of inhaled steroids: Secondary | ICD-10-CM | POA: Diagnosis not present

## 2015-12-15 DIAGNOSIS — J45909 Unspecified asthma, uncomplicated: Secondary | ICD-10-CM

## 2015-12-15 DIAGNOSIS — K31811 Angiodysplasia of stomach and duodenum with bleeding: Secondary | ICD-10-CM

## 2015-12-15 DIAGNOSIS — Q2733 Arteriovenous malformation of digestive system vessel: Secondary | ICD-10-CM

## 2015-12-15 DIAGNOSIS — F17200 Nicotine dependence, unspecified, uncomplicated: Secondary | ICD-10-CM

## 2015-12-15 MED ORDER — PREDNISONE 5 MG PO TABS: 10.0000 mg | ORAL_TABLET | Freq: Every day | ORAL | Status: DC

## 2015-12-15 MED ORDER — NICOTINE 7 MG/24HR TD PT24: 7.0000 mg | MEDICATED_PATCH | TRANSDERMAL | Status: DC

## 2015-12-15 MED ORDER — PANTOPRAZOLE SODIUM 40 MG PO TBEC: 40.0000 mg | DELAYED_RELEASE_TABLET | Freq: Every day | ORAL | Status: DC

## 2015-12-15 MED ORDER — ALBUTEROL SULFATE HFA 108 (90 BASE) MCG/ACT IN AERS: 2.0000 | INHALATION_SPRAY | RESPIRATORY_TRACT | Status: DC | PRN

## 2015-12-15 MED ORDER — ALBUTEROL SULFATE (2.5 MG/3ML) 0.083% IN NEBU: 2.5000 mg | INHALATION_SOLUTION | Freq: Four times a day (QID) | RESPIRATORY_TRACT | Status: DC | PRN

## 2015-12-15 NOTE — Progress Notes (Signed)
Patient ID: Nicole Mccormick, female   DOB: [birth year], 70 y.o.   MRN: [id number]    Subjective:   Patient ID: Nicole Mccormick female   DOB: August 27, 1946 70 y.o.   MRN: [id number]  HPI: Nicole Mccormick is a 70 y.o. female with PMH as below, here for evaluation of right heel pain.  Please see Problem-Based charting for the status of the patient's chronic medical issues.     Past Medical History  Diagnosis Date  . Hypertension   . Abnormal Pap smear   . Asthma   . Coronary artery disease   . HIV infection ([place])     diagnosed before 2008  . Varicosities   . Seizures ([place])   . GERD (gastroesophageal reflux disease)     previously on aciphex, discontinued november 2012  because patient asymptomatic, and concern about interference with HIV meds.  May try pepcid in the future if symptoms return  . Iron deficiency anemia     Ferritin = 2 in november 2012, started on iron supplemenation  . Arthritis   . CHF (congestive heart failure) ([place])   . Heart murmur   . Hepatitis C antibody test positive   . Complication of anesthesia     "they have a hard time bringing me back" (11/08/2015)   Current Outpatient Prescriptions  Medication Sig Dispense Refill  . acyclovir (ZOVIRAX) 400 MG tablet Take 1 tablet (400 mg total) by mouth daily as needed (for flare ups). 30 tablet 3  . albuterol (PROVENTIL) (2.5 MG/3ML) 0.083% nebulizer solution Take 3 mLs (2.5 mg total) by nebulization every 6 (six) hours as needed for wheezing or shortness of breath. 75 mL 12  . amLODipine (NORVASC) 10 MG tablet Take 10 mg by mouth daily.  0  . aspirin 81 MG tablet Take 1 tablet (81 mg total) by mouth daily. 30 tablet 4  . atorvastatin (LIPITOR) 40 MG tablet Take 1 tablet (40 mg total) by mouth daily at 6 PM. 30 tablet 4  . baclofen (LIORESAL) 10 MG tablet Take 10 mg by mouth 3 (three) times daily as needed for muscle spasms.    . baclofen (LIORESAL) 20 MG tablet take 1 tablet by mouth three times a day 90 tablet 0  . chlorthalidone  (HYGROTON) 25 MG tablet Take 1 tablet (25 mg total) by mouth daily. 90 tablet 3  . dextromethorphan-guaiFENesin (MUCINEX DM) 30-600 MG 12hr tablet Take 1 tablet by mouth 2 (two) times daily. 30 tablet 0  . diazepam (VALIUM) 2 MG tablet take 1 tablet by mouth every 6 hours if needed for anxiety 30 tablet 0  . dolutegravir (TIVICAY) 50 MG tablet Take 1 tablet (50 mg total) by mouth daily. 90 tablet 3  . emtricitabine-tenofovir AF (DESCOVY) 200-25 MG tablet Take 1 tablet by mouth daily. 90 tablet 3  . ferrous sulfate 325 (65 FE) MG tablet Take 1 tablet (325 mg total) by mouth 3 (three) times daily with meals. 90 tablet 3  . gabapentin (NEURONTIN) 600 MG tablet Take 600 mg by mouth 2 (two) times daily.    Marland Kitchen gabapentin (NEURONTIN) 600 MG tablet take 1 tablet by mouth three times a day 90 tablet 3  . isosorbide mononitrate (IMDUR) 30 MG 24 hr tablet Take 1 tablet (30 mg total) by mouth daily. 90 tablet 3  . lisinopril (PRINIVIL,ZESTRIL) 40 MG tablet Take 1 tablet (40 mg total) by mouth daily. 90 tablet 3  . loratadine (CLARITIN) 10 MG tablet Take 1 tablet (10 mg total) by  mouth daily as needed for allergies. 90 tablet 3  . mometasone (ASMANEX) 220 MCG/INH inhaler Inhale 1 puff into the lungs 2 (two) times daily. 1 Inhaler 2  . nicotine (NICODERM CQ - DOSED IN MG/24 HR) 7 mg/24hr patch Place 1 patch (7 mg total) onto the skin daily. 14 patch 0  . nicotine (NICODERM CQ) 14 mg/24hr patch Place 1 patch (14 mg total) onto the skin daily. Use this dose for another 2 weeks. Then start using the lower dose patch (7mg  daily). 14 patch 0  . Nutritional Supplements (ENSURE NUTRA SHAKE HI-CAL) LIQD Take 1 Can by mouth 2 (two) times daily. 60 Can 11  . ondansetron (ZOFRAN-ODT) 4 MG disintegrating tablet Take 1 tablet by mouth daily as needed for nausea or vomiting.   0  . pantoprazole (PROTONIX) 40 MG tablet Take 1 tablet (40 mg total) by mouth 2 (two) times daily. 180 tablet 3  . polyethylene glycol (MIRALAX /  GLYCOLAX) packet Take 17 g by mouth daily as needed for mild constipation or moderate constipation. 14 each 0  . PROAIR HFA 108 (90 BASE) MCG/ACT inhaler inhale 2 puffs by mouth every 6 hours if needed for wheezing or shortness of breath 8.5 Inhaler 5  . TRUVADA 200-300 MG tablet Take 1 tablet by mouth daily.     No current facility-administered medications for this visit.   Family History  Problem Relation Age of Onset  . Diabetes Mother   . Ovarian cancer Sister   . Cancer Sister     ovarian and colon  . Colon cancer Neg Hx    Social History   Social History  . Marital Status: Married    Spouse Name: N/A  . Number of Children: N/A  . Years of Education: N/A   Social History Main Topics  . Smoking status: Current Every Day Smoker -- 0.75 packs/day for 50 years    Types: Cigarettes  . Smokeless tobacco: Never Used     Comment: Wants to quit.  . Alcohol Use: 6.0 oz/week    10 Glasses of wine per week     Comment: Wine.  . Drug Use: No  . Sexual Activity: Not on file     Comment: pt. given condoms   Other Topics Concern  . Not on file   Social History Narrative    Review of Systems: A 10 point review of systems was negative except for: runny nose, congestion, cough, postnasal drip, and intermittent SOB.  Objective:  Physical Exam: There were no vitals filed for this visit. Physical Exam  Constitutional: She is oriented to person, place, and time and well-developed, well-nourished, and in no distress. No distress.  HENT:  Head: Normocephalic and atraumatic.  Eyes: EOM are normal. No scleral icterus.  Neck: No JVD present. No tracheal deviation present.  Cardiovascular: Normal rate, regular rhythm and intact distal pulses.   Grade III/VI systolic crescendo-decrescendo murmur heard best at RUSB, radiating to the carotids.  Pulmonary/Chest: Effort normal. No stridor. No respiratory distress.  Scattered expiratory wheezes. No crackles.  Abdominal: Soft. She exhibits no  distension. There is no tenderness. There is no rebound and no guarding.  Musculoskeletal: She exhibits no edema.  Tenderness to palpation of distal calcaneus and throughout heel.  Positive Tinel's sign at tarsal tunnel.  No swelling or erythema.  Neurological: She is alert and oriented to person, place, and time.  5/5 strength on plantar and dorsiflexion of right foot.  Skin: Skin is warm and dry. She  is not diaphoretic.     Assessment & Plan:   Patient and case were discussed with Dr. Daryll Drown.  Please refer to Problem Based charting for further documentation.

## 2015-12-15 NOTE — Assessment & Plan Note (Signed)
Patient reports 2 weeks to 1 month history of right heel and medial ankle pain. She states the pain is sharp, radiating up the leg, sometimes to the hip. It is a/w numbness and tingling of the big toe, as well as the feeling that she sprained her ankle.  She denies sprain or other trauma to the foot.  Pain is worsened with standing and walking, but continues to hurt when she is not applying pressure.  Pain minimally relieved with Vapor rub and shoe inserts.  She denies swelling, erythema, or popping of the foot.   On exam, she has numbness of medial distal foot.  Point tenderness to palpation of distal calcaneus and medial malleolus.  Positive Tinel's sign at tarsal tunnel.  A/P: Tarsal tunnel syndrome > plantar fasciitis or calcaneal bone spur.  Will try conservative management with small steroid burst and Podiatry referral for orthotics.  Will avoid NSAIDs given patient's gastric/duodenal AVMs and recent GI bleed.  She is also taking 1-3 BC/Goody powders per day at this time.  If no improvement, consider foot X-rays to r/o calcaneal bone spur. - Prednisone 10 mg qday x 5 days - Protonix 40 mg daily - STOP BC/Goody powder - Consider foot Xrays if no improvement

## 2015-12-15 NOTE — Patient Instructions (Signed)
1. Take Prednisone 10 mg (2 tablets) once daily with food for 5 days. 2. Take Protonix 40 mg (1 tab) once daily before Prednisone. 3. DO NOT TAKE BC or Goody powder. 4. Continue Albuterol inhaler as needed. 5 Follow up with Podiatry for orthotics. 6. RTC 1 month.  Tarsal Tunnel Syndrome With Rehab Tarsal tunnel syndrome is a condition that involves pressure (compression) on the nerve in the ankle (posterior tibial nerve) and results in pain and loss of feeling on the bottom of the foot. The nerve is usually compressed by other structures within the ankle. SYMPTOMS   Signs of nerve damage: pain, numbness, tingling, and loss of feeling along the bottom of the foot.  Pain that worsens with activity.  Feeling a lack of stability in the ankle. CAUSES  Tarsal tunnel syndrome is caused by structures within the ankle placing pressure on the nerve inside the ankle, which causes sensations in the bottom of the foot. Common sources of pressure include:  Ligament-like tissue (retinaculum) that covers the nerve area in the ankle (tarsal tunnel).  Bony spurs or bumps.  Inflamed tendons (tendonitis). RISK INCREASES WITH:  Stretched ankle ligaments, which create a loose joint.  Flat feet.  Arthritis of the ankle.  Inflammation of tendons in the foot and ankle.  Previous foot or ankle injury. PREVENTION  Warm up and stretch properly before activity.  Maintain physical fitness:  Strength, flexibility, and endurance.  Cardiovascular fitness (increases heart rate).  Wear properly fitted shoes.  Wear arch supports (orthotics), if you have flat feet.  Protect the ankle with taping, braces, or compression bandages. PROGNOSIS  If treated properly, the symptoms of tarsal tunnel syndrome usually go away with non-surgical treatment. Occasionally, surgery is necessary to free the compressed nerve.  RELATED COMPLICATIONS  Permanent nerve damage, including pain, numbness, tingling, or weakness  in the ankle. TREATMENT Treatment first involves resting from any activities that aggravate the symptoms, as well as the use of ice and medicine to reduce pain and inflammation. The use of strengthening and stretching exercises may help reduce pain from activity. Other treatments include wearing arch supports, if you have flat feet, and cross training (training in various physical activities) to reduce stress on the foot and ankle. If symptoms persist, despite non-surgical treatment, then surgery may be recommended. Surgery usually provides full relief from symptoms.  MEDICATION   If pain medicine is necessary, nonsteroidal anti-inflammatory medicines (aspirin and ibuprofen), or other minor pain relievers (acetaminophen), are often recommended.  Do not take pain medication for 7 days before surgery.  Prescription pain relievers may be given if your caregiver thinks they are necessary. Use only as directed and only as much as you need. HEAT AND COLD  Cold treatment (icing) relieves pain and reduces inflammation. Cold treatment should be applied for 10 to 15 minutes every 2 to 3 hours, and immediately after any activity that aggravates your symptoms. Use ice packs or an ice massage.  Heat treatment may be used prior to performing stretching and strengthening activities prescribed by your caregiver, physical therapist, or athletic trainer. Use a heat pack or a warm water soak. SEEK MEDICAL CARE IF:  Treatment does not seem to help, or the condition worsens.  Any medicines produce negative side effects.  Any complications from surgery occur:  Pain, numbness, or coldness in the affected foot.  Discoloration beneath the toenails (blue or gray) of the affected foot.  Signs of infections (fever, pain, inflammation, redness, or persistent bleeding). EXERCISES RANGE OF  MOTION (ROM) AND STRETCHING EXERCISES - Tarsal Tunnel Syndrome (Posterior Tibial Nerve Compression) These exercises may help you  when beginning to restore activity to your injured foot. Complete these exercises with caution. Nerves are very sensitive tissue. They must be exercised gently. Never force a motion and do not push through discomfort. Notify your caregiver of any exercises which increase your pain or worsen your symptoms. Your symptoms may go away with or without further involvement from your physician, physical therapist or athletic trainer. While completing these exercises, remember:   Restoring tissue flexibility helps normal motion to return to the joints. This allows healthier, less painful movement and activity.  An effective stretch should be held for at least 30 seconds.  A stretch should never be painful. You should only feel a gentle lengthening or release in the stretched tissue. STRETCH - Gastroc, Standing  Place hands on wall.  Extend right / left leg behind you and place a folded washcloth under the arch of your foot for support. Keep the front knee somewhat bent.  Slightly point your toes inward on your back foot.  Keeping your right / left heel on the floor and your knee straight, shift your weight toward the wall, not allowing your back to arch.  You should feel a gentle stretch in the right / left calf. Hold this position for __________ seconds. Repeat __________ times. Complete this stretch __________ times per day. STRETCH - Soleus, Standing  Place hands on wall.  Extend right / left leg behind you and place a folded washcloth under the arch of your foot for support. Keep the front knee somewhat bent.  Slightly point your toes inward on your back foot.  Keep your right / left heel on the floor, bend your back knee, and slightly shift your weight over the back leg so that you feel a gentle stretch deep in your back calf.  Hold this position for __________ seconds. Repeat __________ times. Complete this stretch __________ times per day. RANGE OF MOTION - Toe Extension, Flexion  Sit  with your right / left leg crossed over your opposite knee.  Grasp your toes and gently pull them back toward the top of your foot. You should feel a stretch on the bottom of your toes and foot.  Hold this stretch for __________ seconds.  Now, gently pull your toes toward the bottom of your foot. You should feel a stretch on the top of your toes and foot.  Hold this stretch for __________ seconds. Repeat __________ times. Complete this stretch __________ times per day.  RANGE OF MOTION - Ankle Eversion  Sit with your right / left ankle crossed over your opposite knee.  Grip your foot with your opposite hand, placing your thumb on the top of your foot and your fingers across the bottom of your foot.  Gently push your foot downward with a slight rotation so your littlest toes rise slightly toward the ceiling.  You should feel a gentle stretch on the inside of your ankle. Hold the stretch for __________ seconds. Repeat __________ times. Complete this exercise __________ times per day.  RANGE OF MOTION - Ankle Dorsiflexion, Active Assisted  Remove shoes and sit on a chair, preferably not on a carpeted surface.  Place right / left foot on the floor, directly under knee. Extend your opposite leg for support.  Keeping your heel down, slide your right / left foot back toward the chair until you feel a stretch at your ankle or calf.  If you do not feel a stretch, slide your bottom forward to the edge of the chair while still keeping your heel down.  Hold this stretch for __________ seconds. Repeat __________ times. Complete this stretch __________ times per day.  STRETCH - Hamstrings, Supine  Lie on your back. Loop a belt or towel over the ball of your right / left foot.  Straighten your right / left knee and slowly pull on the belt to raise your leg. Do not allow the right / left knee to bend. Keep your opposite leg flat on the floor.  Raise the leg until you feel a gentle stretch behind  your right / left knee or thigh. Hold this position for __________ seconds. Repeat __________ times. Complete this stretch __________ times per day.  STRETCH - Hamstrings, Doorway  Lie on your back with your right / left leg extended and resting on the wall and the opposite leg flat on the ground through the door. Initially, position your bottom farther away from the wall than the illustration shows.  Keep your right / left knee straight. If you feel a stretch behind your knee or thigh, hold this position for __________ seconds.  If you do not feel a stretch, scoot your bottom closer to the door and hold __________ seconds. Repeat __________ times. Complete this stretch __________ times per day.  STRETCH - Hamstrings, Standing  Stand or sit, and extend your right / left leg, placing your foot on a chair or foot stool  Keep a slight arch in your low back, and keep your hips straight forward.  Lead with your chest and lean forward at the waist, until you feel a gentle stretch in the back of your right / left knee or thigh. (When done correctly, this exercise requires leaning only a small distance.)  Hold this position for __________ seconds. Repeat __________ times. Complete this stretch __________ times per day. STRENGTHENING EXERCISES - Tarsal Tunnel Syndrome (Posterior Tibial Nerve Compression) These exercises may help you when beginning to restore activity to your injured foot. Your symptoms may go away with or without further involvement from your physician, physical therapist or athletic trainer. While completing these exercises, remember:   Muscles can gain both the endurance and the strength needed for everyday activities through controlled exercises.  Complete these exercises as instructed by your physician, physical therapist or athletic trainer. Increase the resistance and repetitions only as guided.  You may experience muscle soreness or fatigue, but the pain or discomfort you  are trying to eliminate should never worsen during these exercises. If this pain does worsen, stop and make certain you are following the directions exactly. If the pain is still present after adjustments, discontinue the exercise until you can discuss the trouble with your caregiver. STRENGTH - Dorsiflexors  Secure a rubber exercise band or tubing to a fixed object (table, pole) and loop the other end around your right / left foot.  Sit on the floor facing the fixed object. The band should be slightly tense when your foot is relaxed.  Slowly draw your foot back toward you, using your ankle and toes.  Hold this position for __________ seconds. Slowly release the tension in the band and return your foot to the starting position. Repeat __________ times. Complete this exercise __________ times per day.  STRENGTH - Plantar-flexors  Sit with your right / left leg extended. Holding onto both ends of a rubber exercise band or tubing, loop it around the ball of your  foot. Keep a slight tension in the band.  Slowly push your toes away from you, pointing them downward.  Hold this position for __________ seconds. Return to the starting position slowly, controlling the tension in the band. Repeat __________ times. Complete this exercise __________ times per day.  STRENGTH - Plantar-flexors, Standing  Stand with your feet shoulder width apart. Place your hands on a wall or table to steady yourself, using as little support as needed.  Keeping your weight evenly spread over the width of your feet, rise up on your toes.*  Hold this position for __________ seconds. Repeat __________ times. Complete this exercise __________ times per day.  *If this is too easy, shift your weight toward your right / left leg until you feel challenged. Ultimately, you may be asked to do this exercise while standing on your right / left foot only. STRENGTH - Towel Curls  Sit in a chair, on a non-carpeted surface.  Place  your foot on a towel, keeping your heel on the floor.  Pull the towel toward your heel only by curling your toes. Keep your heel on the floor.  If instructed by your physician, physical therapist or athletic trainer, add ____________________ at the end of the towel. Repeat __________ times. Complete this exercise __________ times per day. STRENGTH - Ankle Eversion  Secure one end of a rubber exercise band or tubing to a fixed object (table, pole). Loop the other end around your foot, just before your toes.  Place your fists between your knees. This will focus your strengthening at your ankle.  Drawing the band across your opposite foot, away from the pole, slowly pull your little toe out and up. Make sure the band is positioned to resist the entire motion.  Hold this position for __________ seconds.  Return to the starting position slowly, controlling the tension in the band. Repeat __________ times. Complete this exercise __________ times per day.  STRENGTH - Ankle Inversion  Secure one end of a rubber exercise band or tubing to a fixed object (table, pole). Loop the other end around your foot, just before your toes.  Place your fists between your knees. This will focus your strengthening at your ankle.  Slowly, pull your big toe up and in, making sure the band is positioned to resist the entire motion.  Hold this position for __________ seconds.  Return to the starting position slowly, controlling the tension in the band. Repeat __________ times. Complete this exercises __________ times per day.    This information is not intended to replace advice given to you by your health care provider. Make sure you discuss any questions you have with your health care provider.   Document Released: 11/25/2005 Document Revised: 04/11/2015 Document Reviewed: 03/09/2009 Elsevier Interactive Patient Education [employer].

## 2015-12-15 NOTE — Assessment & Plan Note (Addendum)
Patient seen for follow up of GI bleed 2/2 AVMs.  GI wanted cardiology evaluation prior to intervention of AVMs.  Cardiology evaluation recommended medical management of her bradycardia and CAD.  She continues to follow with [facility], and has an appointment in February.  She has not seen [place] yet.  She denies CP, lightheadedness, dizziness, N/V, C/D, hematochezia, or melena.  A/P: Gastric/Duodenal AVMs, currently stable.  - Follow up with GI - STOP BC/Goody powder. - STOP smoking

## 2015-12-15 NOTE — Assessment & Plan Note (Signed)
Patient states asthma stable.  However, she has been experiencing 1 week of cough, runny nose, congestion, and PND.  She denies fever.  She is using her Albuterol inhaler twice per day currently.  Prior to this episode, she was using it once every few days.  A/P: Mild intermittent asthma, possibly worsened by acute viral URI.  - Continue Albuterol q4h PRN - Stop smoking - Nicotine patches.

## 2015-12-18 NOTE — Addendum Note (Signed)
Addended by: Gilles Chiquito B on: 12/18/2015 11:31 AM   Modules accepted: Level of Service

## 2015-12-18 NOTE — Progress Notes (Signed)
Internal Medicine Clinic Attending  Case discussed with Dr. Taylor at the time of the visit.  We reviewed the resident's history and exam and pertinent patient test results.  I agree with the assessment, diagnosis, and plan of care documented in the resident's note. 

## 2015-12-20 ENCOUNTER — Ambulatory Visit: Payer: Medicare Other | Admitting: Pulmonary Disease

## 2015-12-20 ENCOUNTER — Ambulatory Visit (INDEPENDENT_AMBULATORY_CARE_PROVIDER_SITE_OTHER): Payer: Medicare Other | Admitting: Infectious Diseases

## 2015-12-20 VITALS — BP 190/80 | HR 56 | Temp 98.1°F | Wt 138.5 lb

## 2015-12-20 DIAGNOSIS — R87622 Low grade squamous intraepithelial lesion on cytologic smear of vagina (LGSIL): Secondary | ICD-10-CM | POA: Diagnosis not present

## 2015-12-20 DIAGNOSIS — B20 Human immunodeficiency virus [HIV] disease: Secondary | ICD-10-CM

## 2015-12-20 DIAGNOSIS — B182 Chronic viral hepatitis C: Secondary | ICD-10-CM | POA: Diagnosis not present

## 2015-12-20 DIAGNOSIS — K31811 Angiodysplasia of stomach and duodenum with bleeding: Secondary | ICD-10-CM

## 2015-12-20 DIAGNOSIS — I10 Essential (primary) hypertension: Secondary | ICD-10-CM

## 2015-12-20 NOTE — Assessment & Plan Note (Signed)
Is doing very well Will continue her current art Has gotten PCV and flu vax. Getting mammo today.  rtc in 6 months

## 2015-12-20 NOTE — Assessment & Plan Note (Signed)
Treated, resolved

## 2015-12-20 NOTE — Addendum Note (Signed)
Addended by: Lorne Skeens D on: 12/20/2015 04:54 PM   Modules accepted: Orders

## 2015-12-20 NOTE — Assessment & Plan Note (Signed)
Pt needs repeat, will set her up with GYN

## 2015-12-20 NOTE — Progress Notes (Signed)
   Subjective:    Patient ID: Nicole Mccormick, female    DOB: 1945-12-14, 70 y.o.   MRN: [id number]  HPI 70 yo F with hx of HIV+, Hep C, adm to hospital 10- to 10-8 with HgB of 5.7, She underwent EGD and colonoscopy which did not show active bleeding. She had capsule endoscopy which suggested small AVMs. She was started on protonix and due to this her ART was changed to prezcobix (off the ATVr). Then changed to DTGV/descovy.  Has been taking bp rx- attributes her current reading to getting really agitated this AM. Family issues. Denies missed ACE-I. Believes she is taking the norvasc also.  Has appt to see GI and CV 3-4 weeks.  Has occas L chest dyscomfort, worse with moving her arms.    HIV 1 RNA QUANT (copies/mL)  Date Value  01/26/2015 <20  09/12/2014 <20  03/01/2014 <20   CD4 T CELL ABS (/uL)  Date Value  10/30/2015 550  01/26/2015 520  09/12/2014 620    Review of Systems  Constitutional: Negative for activity change and unexpected weight change.  Eyes: Negative for visual disturbance.  Respiratory: Negative for shortness of breath.   Cardiovascular: Negative for chest pain.  Gastrointestinal: Negative for diarrhea, constipation and anal bleeding.  Genitourinary: Negative for difficulty urinating.  Neurological: Positive for light-headedness. Negative for dizziness and headaches.       Objective:   Physical Exam  Constitutional: She appears well-developed and well-nourished.  HENT:  Mouth/Throat: No oropharyngeal exudate.  Eyes: EOM are normal. Pupils are equal, round, and reactive to light.  Neck: Neck supple.  Cardiovascular: Normal rate, regular rhythm and normal heart sounds.   Pulmonary/Chest: Effort normal and breath sounds normal.  Abdominal: Soft. Bowel sounds are normal. There is no tenderness. There is no rebound.  Musculoskeletal: She exhibits no edema.  Lymphadenopathy:    She has no cervical adenopathy.       Assessment & Plan:

## 2015-12-20 NOTE — Assessment & Plan Note (Signed)
Markedly out of control ( i repeated and got 208/80). She has minimal sx. Will get her into IM clinic asap

## 2015-12-20 NOTE — Assessment & Plan Note (Signed)
Appears stable, has GI f/u.

## 2015-12-22 ENCOUNTER — Encounter: Payer: Self-pay | Admitting: Family Medicine

## 2015-12-28 ENCOUNTER — Other Ambulatory Visit: Payer: Self-pay | Admitting: Internal Medicine

## 2015-12-28 DIAGNOSIS — Z1231 Encounter for screening mammogram for malignant neoplasm of breast: Secondary | ICD-10-CM

## 2015-12-31 ENCOUNTER — Other Ambulatory Visit: Payer: Self-pay | Admitting: Internal Medicine

## 2015-12-31 DIAGNOSIS — E2839 Other primary ovarian failure: Secondary | ICD-10-CM

## 2016-01-01 DIAGNOSIS — I10 Essential (primary) hypertension: Secondary | ICD-10-CM | POA: Diagnosis not present

## 2016-01-02 DIAGNOSIS — I10 Essential (primary) hypertension: Secondary | ICD-10-CM | POA: Diagnosis not present

## 2016-01-03 DIAGNOSIS — I10 Essential (primary) hypertension: Secondary | ICD-10-CM | POA: Diagnosis not present

## 2016-01-04 DIAGNOSIS — I10 Essential (primary) hypertension: Secondary | ICD-10-CM | POA: Diagnosis not present

## 2016-01-05 DIAGNOSIS — I10 Essential (primary) hypertension: Secondary | ICD-10-CM | POA: Diagnosis not present

## 2016-01-08 DIAGNOSIS — I10 Essential (primary) hypertension: Secondary | ICD-10-CM | POA: Diagnosis not present

## 2016-01-09 ENCOUNTER — Ambulatory Visit: Payer: Medicare Other

## 2016-01-09 DIAGNOSIS — I10 Essential (primary) hypertension: Secondary | ICD-10-CM | POA: Diagnosis not present

## 2016-01-10 DIAGNOSIS — I10 Essential (primary) hypertension: Secondary | ICD-10-CM | POA: Diagnosis not present

## 2016-01-11 DIAGNOSIS — I10 Essential (primary) hypertension: Secondary | ICD-10-CM | POA: Diagnosis not present

## 2016-01-12 ENCOUNTER — Encounter: Payer: Medicare Other | Admitting: Family Medicine

## 2016-01-12 ENCOUNTER — Telehealth: Payer: Self-pay | Admitting: *Deleted

## 2016-01-12 ENCOUNTER — Ambulatory Visit: Payer: Medicare Other | Admitting: Cardiology

## 2016-01-12 DIAGNOSIS — I10 Essential (primary) hypertension: Secondary | ICD-10-CM | POA: Diagnosis not present

## 2016-01-12 NOTE — Telephone Encounter (Signed)
Patient no showed for appointment. Per Dr Nehemiah Settle, called patient to reschedule. Patient will be in 3/3 @ 9am

## 2016-01-15 ENCOUNTER — Ambulatory Visit (HOSPITAL_COMMUNITY)
Admission: RE | Admit: 2016-01-15 | Discharge: 2016-01-15 | Disposition: A | Discharge status: Home / Self Care | Payer: Medicare Other | Source: Ambulatory Visit | Attending: Internal Medicine | Admitting: Internal Medicine

## 2016-01-15 ENCOUNTER — Ambulatory Visit (INDEPENDENT_AMBULATORY_CARE_PROVIDER_SITE_OTHER): Payer: Medicare Other | Admitting: Internal Medicine

## 2016-01-15 ENCOUNTER — Encounter: Payer: Self-pay | Admitting: Cardiology

## 2016-01-15 ENCOUNTER — Encounter: Payer: Self-pay | Admitting: Internal Medicine

## 2016-01-15 ENCOUNTER — Ambulatory Visit (INDEPENDENT_AMBULATORY_CARE_PROVIDER_SITE_OTHER): Payer: Medicare Other | Admitting: Cardiology

## 2016-01-15 VITALS — BP 178/78 | HR 59 | Temp 98.0°F | Wt 139.7 lb

## 2016-01-15 VITALS — BP 152/64 | HR 64 | Ht 62.0 in | Wt 139.0 lb

## 2016-01-15 DIAGNOSIS — Z8719 Personal history of other diseases of the digestive system: Secondary | ICD-10-CM | POA: Diagnosis not present

## 2016-01-15 DIAGNOSIS — I10 Essential (primary) hypertension: Secondary | ICD-10-CM | POA: Diagnosis not present

## 2016-01-15 DIAGNOSIS — R072 Precordial pain: Secondary | ICD-10-CM | POA: Diagnosis not present

## 2016-01-15 DIAGNOSIS — F17200 Nicotine dependence, unspecified, uncomplicated: Secondary | ICD-10-CM

## 2016-01-15 DIAGNOSIS — R001 Bradycardia, unspecified: Secondary | ICD-10-CM | POA: Diagnosis not present

## 2016-01-15 DIAGNOSIS — R05 Cough: Secondary | ICD-10-CM | POA: Insufficient documentation

## 2016-01-15 DIAGNOSIS — I2510 Atherosclerotic heart disease of native coronary artery without angina pectoris: Secondary | ICD-10-CM

## 2016-01-15 DIAGNOSIS — R059 Cough, unspecified: Secondary | ICD-10-CM

## 2016-01-15 DIAGNOSIS — R0602 Shortness of breath: Secondary | ICD-10-CM | POA: Diagnosis not present

## 2016-01-15 DIAGNOSIS — I517 Cardiomegaly: Secondary | ICD-10-CM | POA: Diagnosis not present

## 2016-01-15 DIAGNOSIS — K31811 Angiodysplasia of stomach and duodenum with bleeding: Secondary | ICD-10-CM

## 2016-01-15 DIAGNOSIS — M62830 Muscle spasm of back: Secondary | ICD-10-CM

## 2016-01-15 DIAGNOSIS — R079 Chest pain, unspecified: Secondary | ICD-10-CM | POA: Diagnosis not present

## 2016-01-15 DIAGNOSIS — I700 Atherosclerosis of aorta: Secondary | ICD-10-CM | POA: Diagnosis not present

## 2016-01-15 DIAGNOSIS — I459 Conduction disorder, unspecified: Secondary | ICD-10-CM | POA: Diagnosis not present

## 2016-01-15 DIAGNOSIS — I2583 Coronary atherosclerosis due to lipid rich plaque: Secondary | ICD-10-CM

## 2016-01-15 MED ORDER — GUAIFENESIN 100 MG/5ML PO SOLN: 5.0000 mL | ORAL | Status: DC | PRN

## 2016-01-15 MED ORDER — ISOSORBIDE MONONITRATE ER 60 MG PO TB24: 60.0000 mg | ORAL_TABLET | Freq: Every day | ORAL | Status: DC

## 2016-01-15 NOTE — Patient Instructions (Signed)
Medication Instructions:   INCREASE YOUR IMDUR TO 60 MG ONCE DAILY    Testing/Procedures:  Your physician has recommended that you wear a 24 HOUR holter monitor. Holter monitors are medical devices that record the heart's electrical activity. Doctors most often use these monitors to diagnose arrhythmias. Arrhythmias are problems with the speed or rhythm of the heartbeat. The monitor is a small, portable device. You can wear one while you do your normal daily activities. This is usually used to diagnose what is causing palpitations/syncope (passing out).    Follow-Up:  4 WEEKS WITH DR Meda Coffee OR AN EXTENDER IN OUR OFFICE       If you need a refill on your cardiac medications before your next appointment, please call your pharmacy.

## 2016-01-15 NOTE — Assessment & Plan Note (Signed)
Hx of GI bleed- ? Small bowel AVMs. She denies hematochezia or melena. Will check CBC today.

## 2016-01-15 NOTE — Progress Notes (Signed)
Patient ID: Nicole Mccormick, female   DOB: 12-Sep-1946, 70 y.o.   MRN: [id number]   Subjective:   Patient ID: Nicole Mccormick female   DOB: 1946-11-19 70 y.o.   MRN: [id number]  HPI: Nicole Mccormick is a 70 y.o. with PMH listed below presented with c/o cough of 2 weeks duration with productive ,  Whitish sputum,  with associated runny nose. No sorethroat,  No fever, has mild headache.  On ROS Pt says she has chest pain for the last 4 weeks, with SOB with exertion, she feels she always has chest pain, now a 2-3, pain can go to a 5. Pain is sharp, intermitently, lasts for a few seconds, she goes to her left arm, but otherwise she has a constant ache in her left chest. She also has associated palpitations. She has stable 2 pillow orthopnea. Takes a daily aspirin, and is compliant with her meds.   Past Medical History  Diagnosis Date  . Hypertension   . Abnormal Pap smear   . Asthma   . Coronary artery disease   . HIV infection ([place])     diagnosed before 2008  . Varicosities   . Seizures ([place])   . GERD (gastroesophageal reflux disease)     previously on aciphex, discontinued november 2012  because patient asymptomatic, and concern about interference with HIV meds.  May try pepcid in the future if symptoms return  . Iron deficiency anemia     Ferritin = 2 in november 2012, started on iron supplemenation  . Arthritis   . CHF (congestive heart failure) ([place])   . Heart murmur   . Hepatitis C antibody test positive   . Complication of anesthesia     "they have a hard time bringing me back" (11/08/2015)   Current Outpatient Prescriptions  Medication Sig Dispense Refill  . acyclovir (ZOVIRAX) 400 MG tablet Take 1 tablet (400 mg total) by mouth daily as needed (for flare ups). 30 tablet 3  . albuterol (PROAIR HFA) 108 (90 Base) MCG/ACT inhaler Inhale 2 puffs into the lungs every 4 (four) hours as needed for wheezing or shortness of breath. 8.5 Inhaler 5  . albuterol (PROVENTIL) (2.5 MG/3ML) 0.083% nebulizer  solution Take 3 mLs (2.5 mg total) by nebulization every 6 (six) hours as needed for wheezing or shortness of breath. 75 mL 12  . amLODipine (NORVASC) 10 MG tablet Take 10 mg by mouth daily.  0  . aspirin 81 MG tablet Take 1 tablet (81 mg total) by mouth daily. 30 tablet 4  . atorvastatin (LIPITOR) 40 MG tablet Take 1 tablet (40 mg total) by mouth daily at 6 PM. 30 tablet 4  . baclofen (LIORESAL) 10 MG tablet Take 10 mg by mouth 3 (three) times daily as needed for muscle spasms.    . baclofen (LIORESAL) 20 MG tablet take 1 tablet by mouth three times a day 90 tablet 0  . chlorthalidone (HYGROTON) 25 MG tablet Take 1 tablet (25 mg total) by mouth daily. 90 tablet 3  . dextromethorphan-guaiFENesin (MUCINEX DM) 30-600 MG 12hr tablet Take 1 tablet by mouth 2 (two) times daily. 30 tablet 0  . diazepam (VALIUM) 2 MG tablet take 1 tablet by mouth every 6 hours if needed for anxiety 30 tablet 0  . dolutegravir (TIVICAY) 50 MG tablet Take 1 tablet (50 mg total) by mouth daily. 90 tablet 3  . emtricitabine-tenofovir AF (DESCOVY) 200-25 MG tablet Take 1 tablet by mouth daily. 90 tablet 3  . ferrous sulfate  325 (65 FE) MG tablet Take 1 tablet (325 mg total) by mouth 3 (three) times daily with meals. 90 tablet 3  . gabapentin (NEURONTIN) 600 MG tablet Take 600 mg by mouth 2 (two) times daily.    Marland Kitchen gabapentin (NEURONTIN) 600 MG tablet take 1 tablet by mouth three times a day 90 tablet 3  . isosorbide mononitrate (IMDUR) 30 MG 24 hr tablet Take 1 tablet (30 mg total) by mouth daily. 90 tablet 3  . lisinopril (PRINIVIL,ZESTRIL) 40 MG tablet Take 1 tablet (40 mg total) by mouth daily. 90 tablet 3  . loratadine (CLARITIN) 10 MG tablet Take 1 tablet (10 mg total) by mouth daily as needed for allergies. 90 tablet 3  . mometasone (ASMANEX) 220 MCG/INH inhaler Inhale 1 puff into the lungs 2 (two) times daily. 1 Inhaler 2  . nicotine (NICODERM CQ - DOSED IN MG/24 HR) 7 mg/24hr patch Place 1 patch (7 mg total) onto the  skin daily. 14 patch 0  . Nutritional Supplements (ENSURE NUTRA SHAKE HI-CAL) LIQD Take 1 Can by mouth 2 (two) times daily. 60 Can 11  . ondansetron (ZOFRAN-ODT) 4 MG disintegrating tablet Take 1 tablet by mouth daily as needed for nausea or vomiting.   0  . pantoprazole (PROTONIX) 40 MG tablet Take 1 tablet (40 mg total) by mouth daily. 90 tablet 3  . polyethylene glycol (MIRALAX / GLYCOLAX) packet Take 17 g by mouth daily as needed for mild constipation or moderate constipation. 14 each 0  . predniSONE (DELTASONE) 5 MG tablet Take 2 tablets (10 mg total) by mouth daily with breakfast. 10 tablet 0   No current facility-administered medications for this visit.   Family History  Problem Relation Age of Onset  . Diabetes Mother   . Ovarian cancer Sister   . Cancer Sister     ovarian and colon  . Colon cancer Neg Hx    Social History   Social History  . Marital Status: Married    Spouse Name: N/A  . Number of Children: N/A  . Years of Education: N/A   Social History Main Topics  . Smoking status: Current Every Day Smoker -- 0.75 packs/day for 50 years    Types: Cigarettes  . Smokeless tobacco: Never Used     Comment: Wants to quit.  . Alcohol Use: 6.0 oz/week    10 Glasses of wine per week     Comment: Wine.  . Drug Use: No  . Sexual Activity: Not Asked     Comment: pt. given condoms   Other Topics Concern  . None   Social History Narrative   Review of Systems: CONSTITUTIONAL- No Fever, weightloss, night sweat or change in appetite. SKIN- No Rash, colour changes or itching. HEAD- No Headache or dizziness. RESPIRATORY- No Cough or SOB. CARDIAC- No Palpitations, or chest pain. GI- No vomiting, diarrhoea, abd pain. URINARY- No Frequency or dysuria. NEUROLOGIC- No Numbness, syncope, seizures or burning.  Objective:  Physical Exam: Filed Vitals:   01/15/16 1008  BP: 178/78  Pulse: 59  Temp: 98 F (36.7 C)  TempSrc: Oral  Weight: 139 lb 11.2 oz (63.368 kg)  SpO2:  98%   GENERAL- alert, co-operative, appears as stated age, not in any distress. HEENT- Atraumatic, normocephalic, neck supple. CARDIAC- bradycardic, regular, RR, 3/6 systolic murmur- aortic and pulmonary area,, rubs or gallops. RESP- Moving equal volumes of air, and clear to auscultation bilaterally, mild diffuse wheezes , no crackles. ABDOMEN- Soft, nontender, bowel sounds present. BACK-  Normal curvature of the spine NEURO- No obvious Cr N abnormality, strenght upper and lower extremities- intact, Gait- Normal. EXTREMITIES- pulse 2+, symmetric, trace bilat pitting edema. SKIN- Warm, dry, No rash or lesion. PSYCH- Normal mood and affect, appropriate thought content and speech.  Assessment & Plan:   The patient's case and plan of care was discussed with attending physician, Dr. Dareen Piano.  URTI- Most likely due to virus, with associated clear rhinorrhea. No fevers. Has SOb which has been going on for ~1 month, doubt this is related to her cough but will get imaging to endure no PNA. - Chest xray - Guiafenasin- dextromethophan for cough.  Please see problem based charting for assessment and plan.

## 2016-01-15 NOTE — Assessment & Plan Note (Addendum)
Complaints of chest pain, today, but appears to be chronic. Present for the past month. Cardiac cath - Nov 2016 showed significant CAD dx. Medical treatment recommended, ? Rotational arterectomy. Pain is not getting worse. Pt is on Imdur 30mg  daily. Dyspnea on exertion could be related to Cardiac ischemia- but EF on ECHo- 65-70%, Cath- 35-45%, Bradycardia or possible PNA with cough.  Cath results- 11/08/2015-   Prox RCA to Mid RCA lesion, 80% stenosed.  Mid RCA lesion, 75% stenosed.  Mid Cx lesion, 50% stenosed.  Mid LAD lesion, 10% stenosed.  There is moderate left ventricular systolic dysfunction with distal inferior and apical hypokinesis.  RPDA-severely diseased, 95% stenosed.  Plan- EKG done in clinic, stable, no ST or T wave abnormalities, stable bradycardia- 56bpm. - Chest xray evaluate cough and SOB - Pt has an appointment with her cardiologist today, appreciate help with pts care.

## 2016-01-15 NOTE — Progress Notes (Signed)
Patient ID: Nicole Mccormick, female   DOB: 10/03/1946, 70 y.o.   MRN: [id number]     CARDIOLOGY OFFICE NOTE  Date:  01/15/2016    Nicole Mccormick Date of Birth: [birth year] Medical Record [id number]  PCP:  Shela Leff, MD  Cardiologist:  Nancy Fetter chief complaint on file.   History of Present Illness: Nicole Mccormick is a 70 y.o. female who presents today for a post hospital visit. Seen for Dr. Meda Coffee.   She has a past medical history significant for HIV, GERD, diastolic heart failure, AVM's with chronic anemia, and hypertension. She also has chronic bradycardia that has been asymptomatic for the most part.   She presented to the hospital earlier this month with increased fatigue and shortness of breath on exertion. Catheterization demonstrated complex calcific RCA disease, distal PDA with diffuse disease. Medical therapy was recommended. EP saw as well for bradycardia - recommended GI workup and correcting anemia and if symptoms of lightheadedness and fatigue persist despite normal CBC, then consider PPM implant.   01/15/2016  - Comes back today. Here alone. She is following with [facility] - Dr. Penelope Coop- no more testing planned and she is currently not bleeding. Her most recent hemoglobin in the center 2016 was 11.7. However she states that her chest has persisted and she feels it with activities approximately 2 times a day. It resolves at rest. Her blood pressure has been elevated. She hasn't felt any improvement since she was started on Imdur 30 mg daily. She occasionally feels dizzy and feels tired all the time. This is significantly impairing her quality of life as she is usually very active and currently limited by recurrent chest pain.   Past Medical History  Diagnosis Date  . Hypertension   . Abnormal Pap smear   . Asthma   . Coronary artery disease   . HIV infection ([place])     diagnosed before 2008  . Varicosities   . Seizures ([place])   . GERD (gastroesophageal reflux disease)    previously on aciphex, discontinued november 2012  because patient asymptomatic, and concern about interference with HIV meds.  May try pepcid in the future if symptoms return  . Iron deficiency anemia     Ferritin = 2 in november 2012, started on iron supplemenation  . Arthritis   . CHF (congestive heart failure) ([place])   . Heart murmur   . Hepatitis C antibody test positive   . Complication of anesthesia     "they have a hard time bringing me back" (11/08/2015)    Past Surgical History  Procedure Laterality Date  . Abdominal hysterectomy    . Esophagogastroduodenoscopy  10/13/11    small hiatal hernia  . Colonoscopy  10/13/11    small adenoma, anal condyloma  . Colonoscopy  10/13/2011    Procedure: COLONOSCOPY;  Surgeon: Gatha Mayer, MD;  Location: [facility];  Service: Endoscopy;  Laterality: N/A;  . Esophagogastroduodenoscopy  10/13/2011    Procedure: ESOPHAGOGASTRODUODENOSCOPY (EGD);  Surgeon: Gatha Mayer, MD;  Location: [facility] ENDOSCOPY;  Service: Endoscopy;  Laterality: N/A;  . Esophagogastroduodenoscopy N/A 09/14/2014    Procedure: ESOPHAGOGASTRODUODENOSCOPY (EGD);  Surgeon: Arta Silence, MD;  Location: [facility] ENDOSCOPY;  Service: Endoscopy;  Laterality: N/A;  . Colonoscopy N/A 09/14/2014    Procedure: COLONOSCOPY;  Surgeon: Arta Silence, MD;  Location: [facility] ENDOSCOPY;  Service: Endoscopy;  Laterality: N/A;  . Givens capsule study N/A 09/14/2014    Procedure: GIVENS CAPSULE STUDY;  Surgeon: Arta Silence, MD;  Location: [facility] ENDOSCOPY;  Service: Endoscopy;  Laterality: N/A;  . Cardiac catheterization N/A 11/08/2015    Procedure: Left Heart Cath and Coronary Angiography;  Surgeon: Jettie Booze, MD;  Location: [facility] CV LAB;  Service: Cardiovascular;  Laterality: N/A;     Medications: Current Outpatient Prescriptions  Medication Sig Dispense Refill  . acyclovir (ZOVIRAX) 400 MG tablet Take 1 tablet (400 mg total) by mouth daily as needed (for flare ups). 30 tablet 3  .  albuterol (PROAIR HFA) 108 (90 Base) MCG/ACT inhaler Inhale 2 puffs into the lungs every 4 (four) hours as needed for wheezing or shortness of breath. 8.5 Inhaler 5  . albuterol (PROVENTIL) (2.5 MG/3ML) 0.083% nebulizer solution Take 3 mLs (2.5 mg total) by nebulization every 6 (six) hours as needed for wheezing or shortness of breath. 75 mL 12  . amLODipine (NORVASC) 10 MG tablet Take 10 mg by mouth daily.  0  . atorvastatin (LIPITOR) 40 MG tablet Take 1 tablet (40 mg total) by mouth daily at 6 PM. 30 tablet 4  . baclofen (LIORESAL) 10 MG tablet Take 10 mg by mouth 3 (three) times daily as needed for muscle spasms.    . chlorthalidone (HYGROTON) 25 MG tablet Take 1 tablet (25 mg total) by mouth daily. 90 tablet 3  . diazepam (VALIUM) 2 MG tablet take 1 tablet by mouth every 6 hours if needed for anxiety 30 tablet 0  . dolutegravir (TIVICAY) 50 MG tablet Take 1 tablet (50 mg total) by mouth daily. 90 tablet 3  . emtricitabine-tenofovir AF (DESCOVY) 200-25 MG tablet Take 1 tablet by mouth daily. 90 tablet 3  . ferrous sulfate 325 (65 FE) MG tablet Take 1 tablet (325 mg total) by mouth 3 (three) times daily with meals. 90 tablet 3  . gabapentin (NEURONTIN) 600 MG tablet take 1 tablet by mouth three times a day 90 tablet 3  . guaiFENesin (ROBITUSSIN) 100 MG/5ML SOLN Take 5 mLs (100 mg total) by mouth every 4 (four) hours as needed for cough or to loosen phlegm. 1200 mL 0  . isosorbide mononitrate (IMDUR) 30 MG 24 hr tablet Take 1 tablet (30 mg total) by mouth daily. 90 tablet 3  . lisinopril (PRINIVIL,ZESTRIL) 40 MG tablet Take 1 tablet (40 mg total) by mouth daily. 90 tablet 3  . loratadine (CLARITIN) 10 MG tablet Take 1 tablet (10 mg total) by mouth daily as needed for allergies. 90 tablet 3  . mometasone (ASMANEX) 220 MCG/INH inhaler Inhale 1 puff into the lungs 2 (two) times daily. 1 Inhaler 2  . NICODERM CQ 14 MG/24HR patch As directed  0  . NICORETTE 4 MG gum As directed  0  . nicotine (NICODERM  CQ - DOSED IN MG/24 HR) 7 mg/24hr patch Place 1 patch (7 mg total) onto the skin daily. 14 patch 0  . Nutritional Supplements (ENSURE NUTRA SHAKE HI-CAL) LIQD Take 1 Can by mouth 2 (two) times daily. 60 Can 11  . ondansetron (ZOFRAN-ODT) 4 MG disintegrating tablet Take 1 tablet by mouth daily as needed for nausea or vomiting.   0  . pantoprazole (PROTONIX) 40 MG tablet Take 1 tablet (40 mg total) by mouth daily. 90 tablet 3  . polyethylene glycol (MIRALAX / GLYCOLAX) packet Take 17 g by mouth daily as needed for mild constipation or moderate constipation. 14 each 0  . predniSONE (DELTASONE) 5 MG tablet Take 2 tablets (10 mg total) by mouth daily with breakfast. 10 tablet 0  . RA  ASPIRIN EC ADULT LOW ST 81 MG EC tablet Take 1 tablet by mouth daily.  0   No current facility-administered medications for this visit.    Allergies: Allergies  Allergen Reactions  . Penicillins     REACTION: GOES INTO SHOCK & THEN PASSES OU Has patient had a PCN reaction causing immediate rash, facial/tongue/throat swelling, SOB or lightheadedness with hypotension: NO Has patient had a PCN reaction causing severe rash involving mucus membranes or skin necrosis: NO Has patient had a PCN reaction that required hospitalization NO Has patient had a PCN reaction occurring within the last 10 years: NO If all of the above answers are "NO", then may proceed with Cephalosporin use.   . Avelox [Moxifloxacin Hcl In Nacl] Itching and Rash    Social History: The patient  reports that she has been smoking Cigarettes.  She has a 37.5 pack-year smoking history. She has never used smokeless tobacco. She reports that she drinks about 6.0 oz of alcohol per week. She reports that she does not use illicit drugs.   Family History: The patient's family history includes Cancer in her sister; Diabetes in her mother; Ovarian cancer in her sister. There is no history of Colon cancer.   Review of Systems: Please see the history of  present illness.   Otherwise, the review of systems is positive for cramps in the right leg - has with movement .   All other systems are reviewed and negative.   Physical Exam: VS:  Ht 5\' 2"  (1.575 m)  Wt 139 lb (63.05 kg)  BMI 25.42 kg/m2 .  BMI Body mass index is 25.42 kg/(m^2).  Wt Readings from Last 3 Encounters:  01/15/16 139 lb (63.05 kg)  01/15/16 139 lb 11.2 oz (63.368 kg)  12/20/15 138 lb 8 oz (62.823 kg)   BP 152/64 mmHg, HR 64 BPM  General: Pleasant. Well developed, well nourished and in no acute distress. She smells of tobacco.  HEENT: Normal. Neck: Supple, no JVD, carotid bruits, or masses noted.  Cardiac: Regular rate and rhythm. Outflow murmur noted. No edema.  Respiratory:  Lungs are clear to auscultation bilaterally with normal work of breathing.  GI: Soft and nontender.  MS: No deformity or atrophy. Gait and ROM intact. Skin: Warm and dry. Color is normal.  Neuro:  Strength and sensation are intact and no gross focal deficits noted.  Psych: Alert, appropriate and with normal affect.   LABORATORY DATA:  EKG:  EKG is not ordered today.  Lab Results  Component Value Date   WBC 6.2 11/27/2015   HGB 11.7* 11/27/2015   HCT 36.4 11/27/2015   PLT 272 11/27/2015   GLUCOSE 86 11/27/2015   CHOL 142 01/26/2015   TRIG 59 01/26/2015   HDL 64 01/26/2015   LDLCALC 66 01/26/2015   ALT 11* 11/07/2015   AST 19 11/07/2015   NA 141 11/27/2015   K 3.7 11/27/2015   CL 106 11/27/2015   CREATININE 0.86 11/27/2015   BUN 15 11/27/2015   CO2 29 11/27/2015   TSH 0.771 03/08/2014   INR 1.16 09/12/2014   Other Studies Reviewed Today: Cardiac Cath Conclusion from 11/08/2015     Prox RCA to Mid RCA lesion, 80% stenosed.  Mid RCA lesion, 75% stenosed.  Mid Cx lesion, 50% stenosed.  Mid LAD lesion, 10% stenosed.  There is moderate left ventricular systolic dysfunction with distal inferior and apical hypokinesis.  RPDA-severely diseased, 95% stenosed.  Complex,  calcific RCA disease. The distal PDA is small  and diffusely diseased. Her circumflex is codominant. I think medical therapy is the best treatemnt at this time given her issues with GI bleeding. Rotational atherectomy would be needed and high risk given the tortuousity.   Further cardiac plan per Dr. Meda Coffee and the inpatient team.   Echo Study Conclusions from 11/2015  - Left ventricle: The cavity size was normal. There was moderate concentric hypertrophy. Systolic function was hyperdynamic. The estimated ejection fraction was in the range of 70% to 75%. There was dynamic obstruction at restin the mid cavity, with a peak velocity of 240 cm/sec and a peak gradient of 23 mm Hg. Wall motion was normal; there were no regional wall motion abnormalities. Doppler parameters are consistent with abnormal left ventricular relaxation (grade 1 diastolic dysfunction). Doppler parameters are consistent with elevated mean left atrial filling pressure. - Mitral valve: Calcified annulus. There was mild regurgitation. - Left atrium: The atrium was moderately dilated. - Pulmonary arteries: PA peak pressure: 32 mm Hg (S).  Impressions:  - No change from April 2015 study.    Assessment/Plan:  1. Chest pain - known CAD - to manage medically - she hasn't improved clinically - her blood pressure is elevated, I will increase Imdur to 60 mg daily. Smoking cessation imperative.   2. Tobacco abuse - needs to stop. Encouraged.  3. HTN - increase Imdur to 60 mg daily, echocardiogram shows moderate concentric hypertrophy. Systolic function was hyperdynamic. The estimated ejection fraction was in the range of 70% to 75%. There was dynamic obstruction at restin the mid cavity, with a peak velocity of 240 cm/sec and a peak gradient of 23 mm Hg. ideally should be able to start beta blocker however because of her chronic bradycardia we're unable to. He might consider to place a pacemaker in  the future to be able to use beta blocker in order to alleviate her mid-cavitary gradient.   4. Anemia/GI bleed - followed by [facility] - no further testing ordered, hemoglobin 11.7.   5. Chronic bradycardia - I will order 24-hour Holter monitor and refer to EP if necessary.   6. Cramps - potassium and MG level normal in December.   Follow-up in 4 weeks  Signed: Dorothy Spark, MD  01/15/2016 1:29 PM  [facility] [address] [place], [place]  [address] Phone: [phone] Fax: [phone]

## 2016-01-15 NOTE — Assessment & Plan Note (Signed)
BP Readings from Last 3 Encounters:  01/15/16 178/78  12/20/15 190/80  12/15/15 158/65    Lab Results  Component Value Date   NA 141 11/27/2015   K 3.7 11/27/2015   CREATININE 0.86 11/27/2015    Assessment: Blood pressure control:  Uncontrolled Progress toward BP goal:   Not at goal, considering cardiac hx. Comments: Complaint with meds- Norvasc, Imdur, chlorthalidone, and lisinopril  Plan: Medications:  Cont meds for now, no BB due to bradycardia. Other plans: Consider adding hydralazine, but compliance might be an issue - She will bring a bp log next visit, check bp 2 times a day, she says her bp at home is sometimes in the 110s.  - See in 2 weeks to address bp

## 2016-01-15 NOTE — Progress Notes (Signed)
Internal Medicine Clinic Attending  Case discussed with Dr. Emokpae soon after the resident saw the patient.  We reviewed the resident's history and exam and pertinent patient test results.  I agree with the assessment, diagnosis, and plan of care documented in the resident's note. 

## 2016-01-15 NOTE — Patient Instructions (Addendum)
Please follow up with your heart doctor today.   If you have severe worsening of your chest pain please go top the ED.  We will be getting chest xray to be sure you do not have a Pneumonia. We will like you to check your blood pressure everyday and bring your record with you on your next visit, this is because your blood pressure is high. We will have to add another medication, but we will see what your heart doctor says.

## 2016-01-16 DIAGNOSIS — I10 Essential (primary) hypertension: Secondary | ICD-10-CM | POA: Diagnosis not present

## 2016-01-16 LAB — CBC
Hematocrit: 35.1 % (ref 34.0–46.6)
Hemoglobin: 10.5 g/dL — ABNORMAL LOW (ref 11.1–15.9)
MCH: 30.1 pg (ref 26.6–33.0)
MCHC: 29.9 g/dL — ABNORMAL LOW (ref 31.5–35.7)
MCV: 101 fL — ABNORMAL HIGH (ref 79–97)
Platelets: 314 10*3/uL (ref 150–379)
RBC: 3.49 x10E6/uL — ABNORMAL LOW (ref 3.77–5.28)
RDW: 19.7 % — ABNORMAL HIGH (ref 12.3–15.4)
WBC: 5.4 10*3/uL (ref 3.4–10.8)

## 2016-01-17 ENCOUNTER — Other Ambulatory Visit: Payer: Self-pay | Admitting: Cardiology

## 2016-01-17 DIAGNOSIS — R42 Dizziness and giddiness: Secondary | ICD-10-CM

## 2016-01-17 DIAGNOSIS — R001 Bradycardia, unspecified: Secondary | ICD-10-CM

## 2016-01-17 DIAGNOSIS — R072 Precordial pain: Secondary | ICD-10-CM

## 2016-01-17 DIAGNOSIS — I459 Conduction disorder, unspecified: Secondary | ICD-10-CM

## 2016-01-17 DIAGNOSIS — I10 Essential (primary) hypertension: Secondary | ICD-10-CM | POA: Diagnosis not present

## 2016-01-18 DIAGNOSIS — I10 Essential (primary) hypertension: Secondary | ICD-10-CM | POA: Diagnosis not present

## 2016-01-19 DIAGNOSIS — I10 Essential (primary) hypertension: Secondary | ICD-10-CM | POA: Diagnosis not present

## 2016-01-22 ENCOUNTER — Encounter: Payer: Medicare Other | Admitting: Podiatry

## 2016-01-22 DIAGNOSIS — I10 Essential (primary) hypertension: Secondary | ICD-10-CM | POA: Diagnosis not present

## 2016-01-22 NOTE — Addendum Note (Signed)
Addended by: Hulan Fray on: 01/22/2016 06:10 PM   Modules accepted: Orders

## 2016-01-23 DIAGNOSIS — I10 Essential (primary) hypertension: Secondary | ICD-10-CM | POA: Diagnosis not present

## 2016-01-24 ENCOUNTER — Encounter: Payer: Self-pay | Admitting: *Deleted

## 2016-01-24 DIAGNOSIS — I10 Essential (primary) hypertension: Secondary | ICD-10-CM | POA: Diagnosis not present

## 2016-01-24 NOTE — Progress Notes (Signed)
Patient ID: Nicole Mccormick, female   DOB: 1946/11/30, 70 y.o.   MRN: [id number] Patient did not show up for 01/24/2016, 10:30 AM, appointment to have a 24 hour holter monitor applied.

## 2016-01-25 DIAGNOSIS — I10 Essential (primary) hypertension: Secondary | ICD-10-CM | POA: Diagnosis not present

## 2016-01-26 DIAGNOSIS — I10 Essential (primary) hypertension: Secondary | ICD-10-CM | POA: Diagnosis not present

## 2016-01-30 ENCOUNTER — Ambulatory Visit: Payer: Medicare Other

## 2016-01-30 ENCOUNTER — Other Ambulatory Visit: Payer: Medicare Other

## 2016-02-02 ENCOUNTER — Other Ambulatory Visit: Payer: Self-pay | Admitting: *Deleted

## 2016-02-02 NOTE — Telephone Encounter (Signed)
Received faxed refill request from pt's pharmacy for pt's Baclofen.  Last office visit: 01/15/2016 Last Refill: 11/13/2015 Next appt: 04/09/2016

## 2016-02-05 MED ORDER — BACLOFEN 10 MG PO TABS: 10.0000 mg | ORAL_TABLET | Freq: Three times a day (TID) | ORAL | Status: DC | PRN

## 2016-02-08 ENCOUNTER — Ambulatory Visit (INDEPENDENT_AMBULATORY_CARE_PROVIDER_SITE_OTHER): Payer: Medicare Other

## 2016-02-08 DIAGNOSIS — R072 Precordial pain: Secondary | ICD-10-CM | POA: Diagnosis not present

## 2016-02-08 DIAGNOSIS — I459 Conduction disorder, unspecified: Secondary | ICD-10-CM | POA: Diagnosis not present

## 2016-02-08 DIAGNOSIS — R42 Dizziness and giddiness: Secondary | ICD-10-CM | POA: Diagnosis not present

## 2016-02-08 DIAGNOSIS — R001 Bradycardia, unspecified: Secondary | ICD-10-CM

## 2016-02-09 ENCOUNTER — Encounter: Payer: Medicare Other | Admitting: Obstetrics & Gynecology

## 2016-02-12 ENCOUNTER — Ambulatory Visit (INDEPENDENT_AMBULATORY_CARE_PROVIDER_SITE_OTHER): Payer: Medicare Other | Admitting: Physician Assistant

## 2016-02-12 ENCOUNTER — Encounter: Payer: Self-pay | Admitting: Physician Assistant

## 2016-02-12 VITALS — BP 144/66 | HR 64 | Ht 62.0 in | Wt 133.8 lb

## 2016-02-12 DIAGNOSIS — R0989 Other specified symptoms and signs involving the circulatory and respiratory systems: Secondary | ICD-10-CM | POA: Diagnosis not present

## 2016-02-12 DIAGNOSIS — F17200 Nicotine dependence, unspecified, uncomplicated: Secondary | ICD-10-CM

## 2016-02-12 DIAGNOSIS — I2510 Atherosclerotic heart disease of native coronary artery without angina pectoris: Secondary | ICD-10-CM

## 2016-02-12 DIAGNOSIS — I10 Essential (primary) hypertension: Secondary | ICD-10-CM | POA: Diagnosis not present

## 2016-02-12 DIAGNOSIS — R001 Bradycardia, unspecified: Secondary | ICD-10-CM

## 2016-02-12 DIAGNOSIS — I2583 Coronary atherosclerosis due to lipid rich plaque: Secondary | ICD-10-CM

## 2016-02-12 NOTE — Assessment & Plan Note (Signed)
Blood pressure is better today.

## 2016-02-12 NOTE — Assessment & Plan Note (Signed)
Check carotid Dopplers °

## 2016-02-12 NOTE — Assessment & Plan Note (Signed)
Smoking cessation again discussed.
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
## 2016-02-12 NOTE — Assessment & Plan Note (Signed)
Cath in December 2016 recommend medical therapy. Patient's symptoms have improved on increased Imdur.
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
## 2016-02-12 NOTE — Patient Instructions (Addendum)
Medication Instructions:   Your physician recommends that you continue on your current medications as directed. Please refer to the Current Medication list given to you today.   If you need a refill on your cardiac medications before your next appointment, please call your pharmacy.  Labwork:  NONE ORDER TODAY    Testing/Procedures:  Your physician has requested that you have a carotid duplex. This test is an ultrasound of the carotid arteries in your neck. It looks at blood flow through these arteries that supply the brain with blood. Allow one hour for this exam. There are no restrictions or special instructions.    Follow-Up:  NEXT AVAILABLE APPT  DR Rayann Heman   FOR CONSULTATION FOR  A POSSIBLE PACEMAKER   Any Other Special Instructions Will Be Listed Below (If Applicable).

## 2016-02-13 ENCOUNTER — Inpatient Hospital Stay (HOSPITAL_COMMUNITY): Admission: RE | Admit: 2016-02-13 | Payer: Medicare Other | Source: Ambulatory Visit

## 2016-02-15 ENCOUNTER — Encounter: Payer: Medicare Other | Admitting: Podiatry

## 2016-02-15 NOTE — Patient Instructions (Signed)

## 2016-02-21 ENCOUNTER — Inpatient Hospital Stay (HOSPITAL_COMMUNITY): Admission: RE | Admit: 2016-02-21 | Payer: Medicare Other | Source: Ambulatory Visit

## 2016-02-23 ENCOUNTER — Ambulatory Visit (INDEPENDENT_AMBULATORY_CARE_PROVIDER_SITE_OTHER): Payer: Medicare Other | Admitting: Podiatry

## 2016-02-23 ENCOUNTER — Ambulatory Visit (INDEPENDENT_AMBULATORY_CARE_PROVIDER_SITE_OTHER): Payer: Medicare Other

## 2016-02-23 ENCOUNTER — Encounter: Payer: Self-pay | Admitting: Podiatry

## 2016-02-23 VITALS — BP 159/57 | HR 46 | Resp 16 | Ht 62.0 in | Wt 135.0 lb

## 2016-02-23 DIAGNOSIS — M79604 Pain in right leg: Secondary | ICD-10-CM

## 2016-02-23 DIAGNOSIS — M722 Plantar fascial fibromatosis: Secondary | ICD-10-CM

## 2016-02-23 DIAGNOSIS — M79674 Pain in right toe(s): Secondary | ICD-10-CM

## 2016-02-23 DIAGNOSIS — B351 Tinea unguium: Secondary | ICD-10-CM | POA: Diagnosis not present

## 2016-02-23 DIAGNOSIS — M79605 Pain in left leg: Secondary | ICD-10-CM

## 2016-02-23 DIAGNOSIS — M79675 Pain in left toe(s): Secondary | ICD-10-CM | POA: Diagnosis not present

## 2016-02-23 MED ORDER — TRIAMCINOLONE ACETONIDE 10 MG/ML IJ SUSP
10.0000 mg | Freq: Once | INTRAMUSCULAR | Status: AC
  Administered 2016-02-23: 10 mg

## 2016-02-23 NOTE — Progress Notes (Signed)
Subjective:     Patient ID: Nicole Mccormick, female   DOB: 1946-04-30, 70 y.o.   MRN: [id number]  HPI patient presents with severe discomfort in the right plantar fascia at the insertion to calcaneus and also presents with severe nail I both feet that are thick incurvated with pain on all nails especially the right hallux nail. States the heel has been hurting for around 6 months   Review of Systems  All other systems reviewed and are negative.      Objective:   Physical Exam  Constitutional: She is oriented to person, place, and time.  Cardiovascular: Intact distal pulses.   Musculoskeletal: Normal range of motion.  Neurological: She is oriented to person, place, and time.  Skin: Skin is warm.  Nursing note and vitals reviewed.  neurovascular status intact muscle strength adequate range of motion within normal limits with exquisite discomfort right plantar fascia at the insertion of the tendon into the calcaneus with fluid buildup noted. Also noted to have nail disease 1-5 both feet with yellow brittle-like appearance and pain when palpated from a dorsal direction with incurvation of the nailbed right hallux both medial and lateral side     Assessment:     Daily plantar fasciitis right with severe nail disease 1-5 both feet with pain    Plan:     H&P and x-ray of both feet reviewed. Today I injected the right plantar fascia 3 Milligan Kenalog 5 mill grams Xylocaine and applied fascial brace with instructions along with physical therapy. Debrided nailbeds 1-5 both feet with no iatrogenic bleeding noted  X-ray report indicated spur formation but no signs of stress fracture arthritis

## 2016-02-23 NOTE — Patient Instructions (Signed)

## 2016-02-23 NOTE — Progress Notes (Signed)
   Subjective:    Patient ID: Nicole Mccormick, female    DOB: [birth year], 70 y.o.   MRN: [id number]  HPI Patient presents with foot pain in their right foot; heel; x1 yr.  Patient also presents with toe pain in their right foot; great toe. Pt stated, "Can't feel toe"; x6 months.  Patient presents with needing a bilateral nail trim.   Review of Systems  Constitutional: Positive for activity change and fatigue.  Cardiovascular: Positive for palpitations.  Gastrointestinal: Positive for nausea, abdominal pain and blood in stool.  Musculoskeletal: Positive for myalgias, back pain, arthralgias and gait problem.  All other systems reviewed and are negative.      Objective:   Physical Exam        Assessment & Plan:

## 2016-02-27 ENCOUNTER — Inpatient Hospital Stay (HOSPITAL_COMMUNITY): Admission: RE | Admit: 2016-02-27 | Payer: Medicare Other | Source: Ambulatory Visit

## 2016-02-28 ENCOUNTER — Encounter: Payer: Self-pay | Admitting: Internal Medicine

## 2016-02-28 ENCOUNTER — Ambulatory Visit (INDEPENDENT_AMBULATORY_CARE_PROVIDER_SITE_OTHER): Payer: Medicare Other | Admitting: Internal Medicine

## 2016-02-28 VITALS — BP 152/62 | HR 48 | Ht 62.0 in | Wt 140.6 lb

## 2016-02-28 DIAGNOSIS — I495 Sick sinus syndrome: Secondary | ICD-10-CM

## 2016-02-28 DIAGNOSIS — I119 Hypertensive heart disease without heart failure: Secondary | ICD-10-CM | POA: Insufficient documentation

## 2016-02-28 DIAGNOSIS — R001 Bradycardia, unspecified: Secondary | ICD-10-CM | POA: Diagnosis not present

## 2016-02-28 DIAGNOSIS — Z720 Tobacco use: Secondary | ICD-10-CM | POA: Diagnosis not present

## 2016-02-28 NOTE — Patient Instructions (Signed)
Medication Instructions:  Your physician recommends that you continue on your current medications as directed. Please refer to the Current Medication list given to you today.  Labwork: None ordered  Testing/Procedures: None ordered  Follow-Up: No follow up is needed at this time with Dr. Allred.  He will see you on an as needed basis.  Thank you for choosing [facility]!!          

## 2016-02-28 NOTE — Progress Notes (Signed)
Electrophysiology Office Note   Date:  02/28/2016   ID:  Nicole Mccormick, DOB 1946/07/11, MRN [id number]  PCP:  Nicole Leff, MD  Cardiologist:  Dr Nicole Mccormick Primary Electrophysiologist: Nicole Grayer, MD    Chief Complaint  Patient presents with  . Pacemaker Check     History of Present Illness: [name] is a 70 y.o. female who presents today for electrophysiology evaluation.   The patient was seen by me 11/09/15 for bradycardia and fatigue in the setting of GI bleed and anemia.  The patient feels that her bradycardia has been chronic.  She has postural dizziness.  She recently wore an event monitor which revealed some sinus bradycardia but also preserved sinus rates at times.  There were no prolonged pauses (longest only 1.8sec) and no AV block on the monitor.  She has only postural dizziness.  She has occasional nausea as well as exertional fatigue.  She has chronic difficulty with SOB and continues to smoke.  Today, she denies symptoms of palpitations, chest pain,  orthopnea, PND, lower extremity edema, claudication, presyncope, syncope, bleeding, or neurologic sequela. The patient is tolerating medications without difficulties and is otherwise without complaint today.    Past Medical History  Diagnosis Date  . Hypertension   . Abnormal Pap smear   . Asthma   . Coronary artery disease   . HIV infection ([place])     diagnosed before 2008  . Varicosities   . Seizures ([place])   . GERD (gastroesophageal reflux disease)     previously on aciphex, discontinued november 2012  because patient asymptomatic, and concern about interference with HIV meds.  May try pepcid in the future if symptoms return  . Iron deficiency anemia     Ferritin = 2 in november 2012, started on iron supplemenation  . Arthritis   . CHF (congestive heart failure) ([place])   . Heart murmur   . Hepatitis C antibody test positive   . Complication of anesthesia     "they have a hard time bringing me back"  (11/08/2015)   Past Surgical History  Procedure Laterality Date  . Abdominal hysterectomy    . Esophagogastroduodenoscopy  10/13/11    small hiatal hernia  . Colonoscopy  10/13/11    small adenoma, anal condyloma  . Colonoscopy  10/13/2011    Procedure: COLONOSCOPY;  Surgeon: Nicole Mayer, MD;  Location: [place];  Service: Endoscopy;  Laterality: N/A;  . Esophagogastroduodenoscopy  10/13/2011    Procedure: ESOPHAGOGASTRODUODENOSCOPY (EGD);  Surgeon: Nicole Mayer, MD;  Location: [facility] ENDOSCOPY;  Service: Endoscopy;  Laterality: N/A;  . Esophagogastroduodenoscopy N/A 09/14/2014    Procedure: ESOPHAGOGASTRODUODENOSCOPY (EGD);  Surgeon: Nicole Silence, MD;  Location: [facility] ENDOSCOPY;  Service: Endoscopy;  Laterality: N/A;  . Colonoscopy N/A 09/14/2014    Procedure: COLONOSCOPY;  Surgeon: Nicole Silence, MD;  Location: [facility] ENDOSCOPY;  Service: Endoscopy;  Laterality: N/A;  . Givens capsule study N/A 09/14/2014    Procedure: GIVENS CAPSULE STUDY;  Surgeon: Nicole Silence, MD;  Location: [facility] ENDOSCOPY;  Service: Endoscopy;  Laterality: N/A;  . Cardiac catheterization N/A 11/08/2015    Procedure: Left Heart Cath and Coronary Angiography;  Surgeon: Nicole Booze, MD;  Location: [place] CV LAB;  Service: Cardiovascular;  Laterality: N/A;     Current Outpatient Prescriptions  Medication Sig Dispense Refill  . acyclovir (ZOVIRAX) 400 MG tablet Take 1 tablet (400 mg total) by mouth daily as needed (for flare ups). 30 tablet 3  . albuterol (PROAIR HFA) 108 (90  Base) MCG/ACT inhaler Inhale 2 puffs into the lungs every 4 (four) hours as needed for wheezing or shortness of breath. 8.5 Inhaler 5  . albuterol (PROVENTIL) (2.5 MG/3ML) 0.083% nebulizer solution Take 3 mLs (2.5 mg total) by nebulization every 6 (six) hours as needed for wheezing or shortness of breath. 75 mL 12  . amLODipine (NORVASC) 10 MG tablet Take 10 mg by mouth daily.  0  . atorvastatin (LIPITOR) 40 MG tablet Take 1 tablet (40 mg  total) by mouth daily at 6 PM. 30 tablet 4  . baclofen (LIORESAL) 10 MG tablet Take 1 tablet (10 mg total) by mouth 3 (three) times daily as needed for muscle spasms. 30 each 0  . chlorthalidone (HYGROTON) 25 MG tablet Take 1 tablet (25 mg total) by mouth daily. 90 tablet 3  . diazepam (VALIUM) 2 MG tablet take 1 tablet by mouth every 6 hours if needed for anxiety 30 tablet 0  . dolutegravir (TIVICAY) 50 MG tablet Take 1 tablet (50 mg total) by mouth daily. 90 tablet 3  . emtricitabine-tenofovir AF (DESCOVY) 200-25 MG tablet Take 1 tablet by mouth daily. 90 tablet 3  . ferrous sulfate 325 (65 FE) MG tablet Take 1 tablet (325 mg total) by mouth 3 (three) times daily with meals. 90 tablet 3  . gabapentin (NEURONTIN) 600 MG tablet take 1 tablet by mouth three times a day 90 tablet 3  . guaiFENesin (ROBITUSSIN) 100 MG/5ML SOLN Take 5 mLs (100 mg total) by mouth every 4 (four) hours as needed for cough or to loosen phlegm. 1200 mL 0  . isosorbide mononitrate (IMDUR) 60 MG 24 hr tablet Take 1 tablet (60 mg total) by mouth daily. 90 tablet 3  . lisinopril (PRINIVIL,ZESTRIL) 40 MG tablet Take 1 tablet (40 mg total) by mouth daily. 90 tablet 3  . loratadine (CLARITIN) 10 MG tablet Take 1 tablet (10 mg total) by mouth daily as needed for allergies. 90 tablet 3  . mometasone (ASMANEX) 220 MCG/INH inhaler Inhale 1 puff into the lungs 2 (two) times daily. 1 Inhaler 2  . NICODERM CQ 14 MG/24HR patch Place 14 mg onto the skin daily. As directed  0  . NICORETTE 4 MG gum As directed  0  . nicotine (NICODERM CQ - DOSED IN MG/24 HR) 7 mg/24hr patch Place 1 patch (7 mg total) onto the skin daily. 14 patch 0  . Nutritional Supplements (ENSURE NUTRA SHAKE HI-CAL) LIQD Take 1 Can by mouth 2 (two) times daily. 60 Can 11  . ondansetron (ZOFRAN-ODT) 4 MG disintegrating tablet Take 1 tablet by mouth daily as needed for nausea or vomiting.   0  . pantoprazole (PROTONIX) 40 MG tablet Take 1 tablet (40 mg total) by mouth daily.  90 tablet 3  . polyethylene glycol (MIRALAX / GLYCOLAX) packet Take 17 g by mouth daily as needed for mild constipation or moderate constipation. 14 each 0  . RA ASPIRIN EC ADULT LOW ST 81 MG EC tablet Take 1 tablet by mouth daily.  0   No current facility-administered medications for this visit.    Allergies:   Penicillins and Avelox   Social History:  The patient  reports that she has quit smoking. Her smoking use included Cigarettes. She has a 37.5 pack-year smoking history. She has never used smokeless tobacco. She reports that she drinks about 6.0 oz of alcohol per week. She reports that she does not use illicit drugs.   Family History:  The patient's  family  history includes Colon cancer in her sister; Diabetes in her mother; Ovarian cancer in her sister and sister.    ROS:  Please see the history of present illness.   All other systems are reviewed and negative.    PHYSICAL EXAM: VS:  BP 152/62 mmHg  Pulse 48  Ht 5\' 2"  (1.575 m)  Wt 140 lb 9.6 oz (63.776 kg)  BMI 25.71 kg/m2 , BMI Body mass index is 25.71 kg/(m^2). GEN: Well nourished, well developed, in no acute distress HEENT: normal Neck: no JVD, carotid bruits, or masses Cardiac: bradycardic regular rhythm; no murmurs, rubs, or gallops,no edema  Respiratory:  clear to auscultation bilaterally, normal work of breathing GI: soft, nontender, nondistended, + BS MS: no deformity or atrophy Skin: warm and dry  Neuro:  Strength and sensation are intact Psych: euthymic mood, full affect  EKG:  EKG is ordered today. The ekg ordered today shows sinus rhythm 48 bpm, PR 182 msec, QRS 102, Qtc 414, LVH   Recent Labs: 11/07/2015: ALT 11* 11/27/2015: BUN 15; Creat 0.86; Hemoglobin 11.7*; Magnesium 2.2; Potassium 3.7; Sodium 141 01/15/2016: Platelets 314    Lipid Panel     Component Value Date/Time   CHOL 142 01/26/2015 1451   TRIG 59 01/26/2015 1451   HDL 64 01/26/2015 1451   CHOLHDL 2.2 01/26/2015 1451   VLDL 12  01/26/2015 1451   LDLCALC 66 01/26/2015 1451     Wt Readings from Last 3 Encounters:  02/28/16 140 lb 9.6 oz (63.776 kg)  02/23/16 135 lb (61.236 kg)  02/12/16 133 lb 12.8 oz (60.691 kg)      Other studies Reviewed: Additional studies/ records that were reviewed today include: my consult note from 12/16, [facility] note, Dr Thera Flake notes, prior ekgs, holter, echo    ASSESSMENT AND PLAN:  1.  Sinus bradycardia The patient does have sick sinus syndrome.  Though I suspect that she may feel better with pacing, her fatigue and SOB are certainly multifactoral.  She has had no prolonged pauses or syncope.  Today, I have offered PPM for symptomatic improvement.  Risks, benefits, alternatives to pacemaker implantation were discussed in detail with the patient today. The patient understands that the risks include but are not limited to bleeding, infection, pneumothorax, perforation, tamponade, vascular damage, renal failure, MI, stroke, death,  and lead dislodgement.  She is clear at this time that she would like to avoid pacing.  She may reconsider if her symptoms worsen of if she has presyncope or syncope. She should avoid chronotropic agents at this time.  2. Hypertensive cardioverascular disease Stable No change required today Avoid chronotropic agents  3. Tobacco Smoking cessation is advised  Follow-up with Dr Nicole Mccormick as scheduled I will see as needed going forward  Current medicines are reviewed at length with the patient today.   The patient does not have concerns regarding her medicines.  The following changes were made today:  none   Signed, Nicole Grayer, MD  02/28/2016 9:40 AM     [facility] [address] [place] [address] [phone] (office) [phone] (fax)

## 2016-03-01 ENCOUNTER — Ambulatory Visit (INDEPENDENT_AMBULATORY_CARE_PROVIDER_SITE_OTHER): Payer: Medicare Other | Admitting: Cardiology

## 2016-03-01 ENCOUNTER — Encounter: Payer: Self-pay | Admitting: Cardiology

## 2016-03-01 VITALS — BP 136/68 | HR 48 | Ht 62.0 in | Wt 137.0 lb

## 2016-03-01 DIAGNOSIS — Z79899 Other long term (current) drug therapy: Secondary | ICD-10-CM | POA: Diagnosis not present

## 2016-03-01 DIAGNOSIS — I2583 Coronary atherosclerosis due to lipid rich plaque: Secondary | ICD-10-CM

## 2016-03-01 DIAGNOSIS — I2510 Atherosclerotic heart disease of native coronary artery without angina pectoris: Secondary | ICD-10-CM

## 2016-03-01 DIAGNOSIS — R001 Bradycardia, unspecified: Secondary | ICD-10-CM | POA: Diagnosis not present

## 2016-03-01 DIAGNOSIS — E785 Hyperlipidemia, unspecified: Secondary | ICD-10-CM | POA: Diagnosis not present

## 2016-03-01 DIAGNOSIS — I119 Hypertensive heart disease without heart failure: Secondary | ICD-10-CM | POA: Diagnosis not present

## 2016-03-01 LAB — TSH: TSH: 0.81 mIU/L

## 2016-03-01 NOTE — Progress Notes (Signed)
Patient ID: Nicole Mccormick, female   DOB: [birth year], 70 y.o.   MRN: [id number]     CARDIOLOGY OFFICE NOTE  Date:  03/01/2016    [name] Date of Birth: 27-Mar-1946 Medical Record [id number]  PCP:  Shela Leff, MD  Cardiologist:  Nancy Fetter chief complaint on file.   History of Present Illness: Nicole Mccormick is a 70 y.o. female who presents today for a post hospital visit. Seen for Dr. Meda Coffee.   She has a past medical history significant for HIV, GERD, diastolic heart failure, AVM's with chronic anemia, and hypertension. She also has chronic bradycardia that has been asymptomatic for the most part.   She presented to the hospital earlier this month with increased fatigue and shortness of breath on exertion. Catheterization demonstrated complex calcific RCA disease, distal PDA with diffuse disease. Medical therapy was recommended. EP saw as well for bradycardia - recommended GI workup and correcting anemia and if symptoms of lightheadedness and fatigue persist despite normal CBC, then consider PPM implant.   03/01/2016  - 1 months follow-up, in the meantime patient saw Dr. Rayann Heman for evaluation of profound symptomatic bradycardia and he suggested a placement of pacemaker however she wanted to think about it' timing is to have a great baby coming. In the meantime the patient continues feeling fatigued she denies any palpitations or syncope. No significant dizziness. She denies any chest pain she has stable dyspnea on exertion, she is tolerating pravastatin well with no muscle pain or joint pain. She hasn't noticed any bleeding in her most recent hemoglobin in February was 10.5.  Past Medical History  Diagnosis Date  . Hypertension   . Abnormal Pap smear   . Asthma   . Coronary artery disease   . HIV infection ([place])     diagnosed before 2008  . Varicosities   . Seizures ([place])   . GERD (gastroesophageal reflux disease)     previously on aciphex, discontinued november 2012  because  patient asymptomatic, and concern about interference with HIV meds.  May try pepcid in the future if symptoms return  . Iron deficiency anemia     Ferritin = 2 in november 2012, started on iron supplemenation  . Arthritis   . CHF (congestive heart failure) ([place])   . Heart murmur   . Hepatitis C antibody test positive   . Complication of anesthesia     "they have a hard time bringing me back" (11/08/2015)    Past Surgical History  Procedure Laterality Date  . Abdominal hysterectomy    . Esophagogastroduodenoscopy  10/13/11    small hiatal hernia  . Colonoscopy  10/13/11    small adenoma, anal condyloma  . Colonoscopy  10/13/2011    Procedure: COLONOSCOPY;  Surgeon: Gatha Mayer, MD;  Location: [place];  Service: Endoscopy;  Laterality: N/A;  . Esophagogastroduodenoscopy  10/13/2011    Procedure: ESOPHAGOGASTRODUODENOSCOPY (EGD);  Surgeon: Gatha Mayer, MD;  Location: [facility] ENDOSCOPY;  Service: Endoscopy;  Laterality: N/A;  . Esophagogastroduodenoscopy N/A 09/14/2014    Procedure: ESOPHAGOGASTRODUODENOSCOPY (EGD);  Surgeon: Arta Silence, MD;  Location[facility] ENDOSCOPY;  Service: Endoscopy;  Laterality: N/A;  . Colonoscopy N/A 09/14/2014    Procedure: COLONOSCOPY;  Surgeon: Arta Silence, MD;  Location: [facility] ENDOSCOPY;  Service: Endoscopy;  Laterality: N/A;  . Givens capsule study N/A 09/14/2014    Procedure: GIVENS CAPSULE STUDY;  Surgeon: Arta Silence, MD;  Location[facility] ENDOSCOPY;  Service: Endoscopy;  Laterality: N/A;  . Cardiac catheterization N/A 11/08/2015  Procedure: Left Heart Cath and Coronary Angiography;  Surgeon: Jettie Booze, MD;  Location: [place] [facility] LAB;  Service: Cardiovascular;  Laterality: N/A;     Medications: Current Outpatient Prescriptions  Medication Sig Dispense Refill  . acyclovir (ZOVIRAX) 400 MG tablet Take 1 tablet (400 mg total) by mouth daily as needed (for flare ups). 30 tablet 3  . albuterol (PROAIR HFA) 108 (90 Base) MCG/ACT inhaler  Inhale 2 puffs into the lungs every 4 (four) hours as needed for wheezing or shortness of breath. 8.5 Inhaler 5  . albuterol (PROVENTIL) (2.5 MG/3ML) 0.083% nebulizer solution Take 3 mLs (2.5 mg total) by nebulization every 6 (six) hours as needed for wheezing or shortness of breath. 75 mL 12  . amLODipine (NORVASC) 10 MG tablet Take 10 mg by mouth daily.  0  . atorvastatin (LIPITOR) 40 MG tablet Take 1 tablet (40 mg total) by mouth daily at 6 PM. 30 tablet 4  . baclofen (LIORESAL) 10 MG tablet Take 1 tablet (10 mg total) by mouth 3 (three) times daily as needed for muscle spasms. 30 each 0  . chlorthalidone (HYGROTON) 25 MG tablet Take 1 tablet (25 mg total) by mouth daily. 90 tablet 3  . diazepam (VALIUM) 2 MG tablet take 1 tablet by mouth every 6 hours if needed for anxiety 30 tablet 0  . dolutegravir (TIVICAY) 50 MG tablet Take 1 tablet (50 mg total) by mouth daily. 90 tablet 3  . emtricitabine-tenofovir AF (DESCOVY) 200-25 MG tablet Take 1 tablet by mouth daily. 90 tablet 3  . ferrous sulfate 325 (65 FE) MG tablet Take 1 tablet (325 mg total) by mouth 3 (three) times daily with meals. 90 tablet 3  . gabapentin (NEURONTIN) 600 MG tablet take 1 tablet by mouth three times a day 90 tablet 3  . guaiFENesin (ROBITUSSIN) 100 MG/5ML SOLN Take 5 mLs (100 mg total) by mouth every 4 (four) hours as needed for cough or to loosen phlegm. 1200 mL 0  . isosorbide mononitrate (IMDUR) 60 MG 24 hr tablet Take 1 tablet (60 mg total) by mouth daily. 90 tablet 3  . lisinopril (PRINIVIL,ZESTRIL) 40 MG tablet Take 1 tablet (40 mg total) by mouth daily. 90 tablet 3  . loratadine (CLARITIN) 10 MG tablet Take 1 tablet (10 mg total) by mouth daily as needed for allergies. 90 tablet 3  . NICODERM CQ 14 MG/24HR patch Place 14 mg onto the skin daily. As directed  0  . NICORETTE 4 MG gum As directed  0  . nicotine (NICODERM CQ - DOSED IN MG/24 HR) 7 mg/24hr patch Place 1 patch (7 mg total) onto the skin daily. 14 patch 0    . Nutritional Supplements (ENSURE NUTRA SHAKE HI-CAL) LIQD Take 1 Can by mouth 2 (two) times daily. 60 Can 11  . ondansetron (ZOFRAN-ODT) 4 MG disintegrating tablet Take 1 tablet by mouth daily as needed for nausea or vomiting.   0  . pantoprazole (PROTONIX) 40 MG tablet Take 1 tablet (40 mg total) by mouth daily. 90 tablet 3  . polyethylene glycol (MIRALAX / GLYCOLAX) packet Take 17 g by mouth daily as needed for mild constipation or moderate constipation. 14 each 0  . RA ASPIRIN EC ADULT LOW ST 81 MG EC tablet Take 1 tablet by mouth daily.  0   No current facility-administered medications for this visit.    Allergies: Allergies  Allergen Reactions  . Penicillins     REACTION: GOES INTO SHOCK & THEN  PASSES OU Has patient had a PCN reaction causing immediate rash, facial/tongue/throat swelling, SOB or lightheadedness with hypotension: NO Has patient had a PCN reaction causing severe rash involving mucus membranes or skin necrosis: NO Has patient had a PCN reaction that required hospitalization NO Has patient had a PCN reaction occurring within the last 10 years: NO If all of the above answers are "NO", then may proceed with Cephalosporin use.   . Avelox [Moxifloxacin Hcl In Nacl] Itching and Rash    Social History: The patient  reports that she has quit smoking. Her smoking use included Cigarettes. She has a 37.5 pack-year smoking history. She has never used smokeless tobacco. She reports that she drinks about 6.0 oz of alcohol per week. She reports that she does not use illicit drugs.   Family History: The patient's family history includes Colon cancer in her sister; Diabetes in her mother; Ovarian cancer in her sister and sister.   Review of Systems: Please see the history of present illness.   Otherwise, the review of systems is positive for cramps in the right leg - has with movement .   All other systems are reviewed and negative.   Physical Exam: VS:  BP 136/68 mmHg  Pulse 48   Ht 5\' 2"  (1.575 m)  Wt 137 lb (62.143 kg)  BMI 25.05 kg/m2  SpO2 99% .  BMI Body mass index is 25.05 kg/(m^2).  Wt Readings from Last 3 Encounters:  03/01/16 137 lb (62.143 kg)  02/28/16 140 lb 9.6 oz (63.776 kg)  02/23/16 135 lb (61.236 kg)   BP 136/68 mmHg, HR 48 BPM General: Pleasant. Well developed, well nourished and in no acute distress. She smells of tobacco.  HEENT: Normal. Neck: Supple, no JVD, carotid bruits, or masses noted.  Cardiac: Regular rate and rhythm. Outflow murmur noted. No edema.  Respiratory:  Lungs are clear to auscultation bilaterally with normal work of breathing.  GI: Soft and nontender.  MS: No deformity or atrophy. Gait and ROM intact. Skin: Warm and dry. Color is normal.  Neuro:  Strength and sensation are intact and no gross focal deficits noted.  Psych: Alert, appropriate and with normal affect.  LABORATORY DATA:  EKG:  EKG is not ordered today.  Lab Results  Component Value Date   WBC 5.4 01/15/2016   HGB 11.7* 11/27/2015   HCT 35.1 01/15/2016   PLT 314 01/15/2016   GLUCOSE 86 11/27/2015   CHOL 142 01/26/2015   TRIG 59 01/26/2015   HDL 64 01/26/2015   LDLCALC 66 01/26/2015   ALT 11* 11/07/2015   AST 19 11/07/2015   NA 141 11/27/2015   K 3.7 11/27/2015   CL 106 11/27/2015   CREATININE 0.86 11/27/2015   BUN 15 11/27/2015   CO2 29 11/27/2015   TSH 0.771 03/08/2014   INR 1.16 09/12/2014   Other Studies Reviewed Today: Cardiac Cath Conclusion from 11/08/2015     Prox RCA to Mid RCA lesion, 80% stenosed.  Mid RCA lesion, 75% stenosed.  Mid Cx lesion, 50% stenosed.  Mid LAD lesion, 10% stenosed.  There is moderate left ventricular systolic dysfunction with distal inferior and apical hypokinesis.  RPDA-severely diseased, 95% stenosed.  Complex, calcific RCA disease. The distal PDA is small and diffusely diseased. Her circumflex is codominant. I think medical therapy is the best treatemnt at this time given her issues with  GI bleeding. Rotational atherectomy would be needed and high risk given the tortuousity.   Further cardiac plan per Dr.  Royalti Schauf and the inpatient team.   Echo Study Conclusions from 11/2015  - Left ventricle: The cavity size was normal. There was moderate concentric hypertrophy. Systolic function was hyperdynamic. The estimated ejection fraction was in the range of 70% to 75%. There was dynamic obstruction at restin the mid cavity, with a peak velocity of 240 cm/sec and a peak gradient of 23 mm Hg. Wall motion was normal; there were no regional wall motion abnormalities. Doppler parameters are consistent with abnormal left ventricular relaxation (grade 1 diastolic dysfunction). Doppler parameters are consistent with elevated mean left atrial filling pressure. - Mitral valve: Calcified annulus. There was mild regurgitation. - Left atrium: The atrium was moderately dilated. - Pulmonary arteries: PA peak pressure: 32 mm Hg (S).  Impressions:  - No change from April 2015 study.    Assessment/Plan:  1. Chest pain - known CAD - to manage medically - since we increased her Imdur and her blood pressures better control she admits to improvement of symptoms. Smoking cessation imperative.   2. Tobacco abuse - needs to stop. Encouraged. She understands having difficult times.  3. HTN - her BP is now controlled after increased Imdur to 60 mg daily, echocardiogram shows moderate concentric hypertrophy. Systolic function was hyperdynamic. The estimated ejection fraction was in the range of 70% to 75%. There was dynamic obstruction at restin the mid cavity, with a peak velocity of 240 cm/sec and a peak gradient of 23 mm Hg. ideally should be able to start beta blocker however because of her chronic bradycardia we're unable to. He might consider to place a pacemaker in the future to be able to use beta blocker in order to alleviate her mid-cavitary gradient.   4. Anemia/GI  bleed - followed by [facility] - she is now stable on baby aspirin with stable hemoglobin 10.5 in February 2017.   5. Chronic bradycardia -profound symptomatic bradycardia, pacemaker suggested by Dr. Rayann Heman she decided to undergo pacemaker placement in mid May.  6. Hyperlipidemia on atorvastatin 10 mg daily that is well tolerated, we'll schedule lipids and CMP today.  Follow-up in 6 months.  Follow-up in 4 weeks  Signed: Dorothy Spark, MD  03/01/2016 9:36 AM  [facility] [address] [place], [place]  [address] Phone: [phone] Fax: [phone]

## 2016-03-01 NOTE — Patient Instructions (Addendum)
Medication Instructions:  NO CHANGES  Labwork:  TODAY  BMET  LIPID LIVER AND TSH  Testing/Procedures: NONE  Follow-Up: Your physician wants you to follow-up in: [place] will receive a reminder letter in the mail two months in advance. If you don't receive a letter, please call our office to schedule the follow-up appointment.   Any Other Special Instructions Will Be Listed Below (If Applicable).     If you need a refill on your cardiac medications before your next appointment, please call your pharmacy.

## 2016-03-04 ENCOUNTER — Encounter: Payer: Self-pay | Admitting: Podiatry

## 2016-03-04 ENCOUNTER — Ambulatory Visit (INDEPENDENT_AMBULATORY_CARE_PROVIDER_SITE_OTHER): Payer: Medicare Other | Admitting: Podiatry

## 2016-03-04 DIAGNOSIS — M722 Plantar fascial fibromatosis: Secondary | ICD-10-CM

## 2016-03-04 MED ORDER — TRIAMCINOLONE ACETONIDE 10 MG/ML IJ SUSP
10.0000 mg | Freq: Once | INTRAMUSCULAR | Status: AC
  Administered 2016-03-04: 10 mg

## 2016-03-05 NOTE — Progress Notes (Signed)
Subjective:     Patient ID: Nicole Mccormick, female   DOB: 1946-06-18, 70 y.o.   MRN: [id number]  HPI patient presents stating I'm still having pain in my right heel that is still intense when I get up in the morning or after periods of sitting. It is some better   Review of Systems     Objective:   Physical Exam Neurovascular status intact with continued discomfort in the plantar fascia right insertional point tendon calcaneus with flattening the arch noted and pain especially after periods of rest or morning    Assessment:     Acute plantar fasciitis right still present    Plan:     Reinjected the plantar fascia right 3 mg Kenalog 5 mg Xylocaine and advised on night splint usage which was dispensed today with instructions on usage. Reappoint to recheck

## 2016-03-06 LAB — BASIC METABOLIC PANEL
BUN: 17 mg/dL (ref 7–25)
CO2: 29 mmol/L (ref 20–31)
Calcium: 9.3 mg/dL (ref 8.6–10.4)
Chloride: 102 mmol/L (ref 98–110)
Creat: 0.92 mg/dL (ref 0.60–0.93)
Glucose, Bld: 96 mg/dL (ref 65–99)
Potassium: 4 mmol/L (ref 3.5–5.3)
Sodium: 137 mmol/L (ref 135–146)

## 2016-03-06 LAB — HEPATIC FUNCTION PANEL
ALT: 9 U/L (ref 6–29)
AST: 14 U/L (ref 10–35)
Albumin: 3.9 g/dL (ref 3.6–5.1)
Alkaline Phosphatase: 67 U/L (ref 33–130)
Bilirubin, Direct: 0.1 mg/dL (ref <=0.2)
Indirect Bilirubin: 0.3 mg/dL (ref 0.2–1.2)
Total Bilirubin: 0.4 mg/dL (ref 0.2–1.2)
Total Protein: 7.5 g/dL (ref 6.1–8.1)

## 2016-03-06 LAB — LIPID PANEL
Cholesterol: 97 mg/dL — ABNORMAL LOW (ref 125–200)
HDL: 58 mg/dL (ref >=46)
LDL Cholesterol: 27 mg/dL (ref <130)
Total CHOL/HDL Ratio: 1.7 Ratio (ref <=5.0)
Triglycerides: 61 mg/dL (ref <150)
VLDL: 12 mg/dL (ref <30)

## 2016-03-08 ENCOUNTER — Other Ambulatory Visit: Payer: Self-pay | Admitting: *Deleted

## 2016-03-08 MED ORDER — BACLOFEN 10 MG PO TABS: 10.0000 mg | ORAL_TABLET | Freq: Three times a day (TID) | ORAL | Status: DC | PRN

## 2016-03-08 NOTE — Progress Notes (Signed)
This encounter was created in error - please disregard.

## 2016-03-08 NOTE — Telephone Encounter (Signed)
Last seen 11/17/15. Has f/u appt 04/09/16.

## 2016-03-19 ENCOUNTER — Other Ambulatory Visit: Payer: Self-pay | Admitting: *Deleted

## 2016-03-19 ENCOUNTER — Other Ambulatory Visit: Payer: Self-pay | Admitting: Internal Medicine

## 2016-03-19 ENCOUNTER — Encounter: Payer: Self-pay | Admitting: *Deleted

## 2016-03-19 DIAGNOSIS — B20 Human immunodeficiency virus [HIV] disease: Secondary | ICD-10-CM

## 2016-03-19 NOTE — Telephone Encounter (Signed)
This was last filled by dr hatcher, i think you will need to print the script and we can fax the script [facility]

## 2016-03-19 NOTE — Telephone Encounter (Signed)
Needs rx for [facility] [address].

## 2016-03-19 NOTE — Telephone Encounter (Signed)
Please fax signed prescription of THP at fax # [phone].  Thank you.

## 2016-03-26 ENCOUNTER — Other Ambulatory Visit: Payer: Self-pay | Admitting: *Deleted

## 2016-03-26 ENCOUNTER — Other Ambulatory Visit: Payer: Self-pay | Admitting: Infectious Diseases

## 2016-03-26 DIAGNOSIS — B20 Human immunodeficiency virus [HIV] disease: Secondary | ICD-10-CM

## 2016-03-26 MED ORDER — ENSURE NUTRA SHAKE HI-CAL PO LIQD: 1.0000 | Freq: Two times a day (BID) | ORAL | Status: DC

## 2016-03-26 NOTE — Progress Notes (Signed)
Phone call to [facility] to determine if the rx for Ensure was faxed for the pt.  Left a message for Nicole Mccormick, [facility] TAP program with this question.

## 2016-03-27 NOTE — Telephone Encounter (Signed)
Faxed to [facility]

## 2016-03-29 MED ORDER — ENSURE NUTRA SHAKE HI-CAL PO LIQD: 1.0000 | Freq: Two times a day (BID) | ORAL | Status: DC

## 2016-03-29 NOTE — Addendum Note (Signed)
Addended by: Lorne Skeens D on: 03/29/2016 09:20 AM   Modules accepted: Orders

## 2016-04-01 ENCOUNTER — Ambulatory Visit: Payer: Medicare Other | Admitting: Podiatry

## 2016-04-02 ENCOUNTER — Ambulatory Visit: Payer: Medicare Other

## 2016-04-02 ENCOUNTER — Other Ambulatory Visit: Payer: Medicare Other

## 2016-04-09 ENCOUNTER — Other Ambulatory Visit: Payer: Self-pay

## 2016-04-09 ENCOUNTER — Ambulatory Visit (INDEPENDENT_AMBULATORY_CARE_PROVIDER_SITE_OTHER): Payer: Medicare Other | Admitting: Internal Medicine

## 2016-04-09 ENCOUNTER — Encounter: Payer: Self-pay | Admitting: Internal Medicine

## 2016-04-09 VITALS — BP 177/62 | HR 59 | Temp 98.3°F | Ht 62.0 in | Wt 133.5 lb

## 2016-04-09 DIAGNOSIS — Z79899 Other long term (current) drug therapy: Secondary | ICD-10-CM

## 2016-04-09 DIAGNOSIS — I2510 Atherosclerotic heart disease of native coronary artery without angina pectoris: Secondary | ICD-10-CM

## 2016-04-09 DIAGNOSIS — Z21 Asymptomatic human immunodeficiency virus [HIV] infection status: Secondary | ICD-10-CM

## 2016-04-09 DIAGNOSIS — K219 Gastro-esophageal reflux disease without esophagitis: Secondary | ICD-10-CM | POA: Diagnosis not present

## 2016-04-09 DIAGNOSIS — I10 Essential (primary) hypertension: Secondary | ICD-10-CM | POA: Diagnosis not present

## 2016-04-09 DIAGNOSIS — R059 Cough, unspecified: Secondary | ICD-10-CM

## 2016-04-09 DIAGNOSIS — F17200 Nicotine dependence, unspecified, uncomplicated: Secondary | ICD-10-CM

## 2016-04-09 DIAGNOSIS — F17210 Nicotine dependence, cigarettes, uncomplicated: Secondary | ICD-10-CM | POA: Diagnosis not present

## 2016-04-09 DIAGNOSIS — R001 Bradycardia, unspecified: Secondary | ICD-10-CM

## 2016-04-09 DIAGNOSIS — R05 Cough: Secondary | ICD-10-CM | POA: Diagnosis not present

## 2016-04-09 MED ORDER — FAMOTIDINE 20 MG PO TABS: 20.0000 mg | ORAL_TABLET | Freq: Two times a day (BID) | ORAL | Status: DC

## 2016-04-09 MED ORDER — SPIRONOLACTONE 25 MG PO TABS: 25.0000 mg | ORAL_TABLET | Freq: Every day | ORAL | Status: DC

## 2016-04-09 NOTE — Patient Instructions (Signed)
For your blood pressure:  STOP taking Lisinopril  Start taking Spironolactone as instructed.   Keep taking Amlodipine, Chlorthalidone, and Imdur.    For acid reflux:  Start taking Pepcid as instructed.  Continue taking Protonix.    Return to the clinic for a follow-up visit in 4 weeks.

## 2016-04-12 DIAGNOSIS — R05 Cough: Secondary | ICD-10-CM | POA: Insufficient documentation

## 2016-04-12 DIAGNOSIS — R059 Cough, unspecified: Secondary | ICD-10-CM | POA: Insufficient documentation

## 2016-04-12 MED ORDER — RA ASPIRIN EC ADULT LOW ST 81 MG PO TBEC: 81.0000 mg | DELAYED_RELEASE_TABLET | Freq: Every day | ORAL | Status: DC

## 2016-04-12 NOTE — Assessment & Plan Note (Signed)
BP Readings from Last 3 Encounters:  04/09/16 177/62  03/01/16 136/68  02/28/16 152/62    Lab Results  Component Value Date   NA 137 03/01/2016   K 4.0 03/01/2016   CREATININE 0.92 03/01/2016    Assessment: Blood pressure control:   uncontrolled  Progress toward BP goal:   not at goal Comments: She is currently on amlodipine 10 mg daily, chlorthalidone 25 mg daily, Imdur 60 mg daily, and lisinopril 40 mg daily. Patient reports being compliant with all of her medications. She is also complaining of a dry cough for the past 2 weeks. Denies having any headaches or vision changes. Does report having occasional chest pain - patient has known coronary artery disease and chronic bradycardia. As per cardiology documentation, she is scheduled to get a pacemaker this month.  Plan: Medications: Discontinue lisinopril as it could be contributing to her dry cough. Start patient on spironolactone 25 mg daily instead. Continue amlodipine, chlorthalidone, and Imdur as above. She cannot be on a beta blocker at this time due to her chronic bradycardia. Educational resources provided:  Encouraged healthy eating and exercise. Encouraged her to check her blood pressure twice daily and keep a log of the readings that she can bring with her to the next visit. Other plans: -Recheck blood pressure at follow-up visit in one month. Also ask about resolution of her dry cough.

## 2016-04-12 NOTE — Assessment & Plan Note (Signed)
Assessment: Patient is a current one half pack per day smoker and reports smoking for the past 55 years. I spent a dedicated 3-4 minutes discussing smoking cessation and patient states she is not ready to quit at this time  Plan: -Patient is currently in the pre-contemplation stage. Discussed smoking cessation at next visit.

## 2016-04-12 NOTE — Assessment & Plan Note (Signed)
Assessment: Dry and hacking cough for the past 2 weeks. GERD could possibly be contributing to her cough as patient reports having an "acid-like" taste in her mouth despite taking Protonix daily. She is currently on an ACE inhibitor (lisinopril 40 mg daily) which could also possibly be a reason for her cough. Patient is a current one half pack per day smoker. She denies having any wheezing or shortness of breath and states her rescue inhaler use has not changed over the past 2 weeks. Lungs were clear on exam, however, cough variant asthma is also on the differential. She described the cough as a "choking sensation" in her throat. Not likely viral URI as patient denies having any nasal congestion, rhinorrhea, or sneezing. Patient does have HIV and is currently following up with infectious disease. Her CD4 count was 550 in November 2016 making opportunistic infections a less likely cause of her acute cough.  Plan: -Discontinue lisinopril 40 mg daily. Patient has been started on spironolactone 25 mg daily instead. -Continue Protonix 40 mg daily -Started patient on Pepcid 20 mg twice daily -Reassess symptoms at next visit

## 2016-04-12 NOTE — Progress Notes (Addendum)
Patient ID: Nicole Mccormick, female   DOB: November 08, 1946, 69 y.o.   MRN: [id number]   Subjective:   Patient ID: Nicole Mccormick female   DOB: 27-Apr-1946 70 y.o.   MRN: [id number]  HPI: [name] is a 70 y.o. female with a past medical history of conditions listed below presenting for regular checkup. Patient is also complaining of a cough for the past 2 weeks. She describes the cough as dry and hacking. States it feels like a "choking sensation" in her throat. Reports trying over-the-counter Robitussin, Delsym, cough drops, honey, and saltwater gargles but to no avail. Denies having any fevers, chills, sore throat, nasal congestion, rhinorrhea, sneezing, nausea, vomiting, or diarrhea. Denies having any recent sick contacts. Denies having any dysphagia or odynophagia. She has a history of asthma but denies having any wheezing or shortness of breath and states her rescue inhaler use has not increased in the past 2 weeks. She does report having a "acid-like" taste in her mouth despite taking Protonix daily.     Past Medical History  Diagnosis Date  . Hypertension   . Abnormal Pap smear   . Asthma   . Coronary artery disease   . HIV infection ([place])     diagnosed before 2008  . Varicosities   . Seizures ([place])   . GERD (gastroesophageal reflux disease)     previously on aciphex, discontinued november 2012  because patient asymptomatic, and concern about interference with HIV meds.  May try pepcid in the future if symptoms return  . Iron deficiency anemia     Ferritin = 2 in november 2012, started on iron supplemenation  . Arthritis   . CHF (congestive heart failure) ([place])   . Heart murmur   . Hepatitis C antibody test positive   . Complication of anesthesia     "they have a hard time bringing me back" (11/08/2015)   Current Outpatient Prescriptions  Medication Sig Dispense Refill  . acyclovir (ZOVIRAX) 400 MG tablet Take 1 tablet (400 mg total) by mouth daily as needed (for flare ups). 30 tablet 3    . albuterol (PROAIR HFA) 108 (90 Base) MCG/ACT inhaler Inhale 2 puffs into the lungs every 4 (four) hours as needed for wheezing or shortness of breath. 8.5 Inhaler 5  . albuterol (PROVENTIL) (2.5 MG/3ML) 0.083% nebulizer solution Take 3 mLs (2.5 mg total) by nebulization every 6 (six) hours as needed for wheezing or shortness of breath. 75 mL 12  . amLODipine (NORVASC) 10 MG tablet Take 10 mg by mouth daily.  0  . atorvastatin (LIPITOR) 40 MG tablet Take 1 tablet (40 mg total) by mouth daily at 6 PM. 30 tablet 4  . baclofen (LIORESAL) 10 MG tablet Take 1 tablet (10 mg total) by mouth 3 (three) times daily as needed for muscle spasms. 90 each 0  . chlorthalidone (HYGROTON) 25 MG tablet Take 1 tablet (25 mg total) by mouth daily. 90 tablet 3  . diazepam (VALIUM) 2 MG tablet take 1 tablet by mouth every 6 hours if needed for anxiety 30 tablet 0  . dolutegravir (TIVICAY) 50 MG tablet Take 1 tablet (50 mg total) by mouth daily. 90 tablet 3  . emtricitabine-tenofovir AF (DESCOVY) 200-25 MG tablet Take 1 tablet by mouth daily. 90 tablet 3  . famotidine (PEPCID) 20 MG tablet Take 1 tablet (20 mg total) by mouth 2 (two) times daily. 60 tablet 2  . ferrous sulfate 325 (65 FE) MG tablet Take 1 tablet (325 mg total) by  mouth 3 (three) times daily with meals. 90 tablet 3  . gabapentin (NEURONTIN) 600 MG tablet take 1 tablet by mouth three times a day 90 tablet 3  . guaiFENesin (ROBITUSSIN) 100 MG/5ML SOLN Take 5 mLs (100 mg total) by mouth every 4 (four) hours as needed for cough or to loosen phlegm. 1200 mL 0  . isosorbide mononitrate (IMDUR) 60 MG 24 hr tablet Take 1 tablet (60 mg total) by mouth daily. 90 tablet 3  . loratadine (CLARITIN) 10 MG tablet Take 1 tablet (10 mg total) by mouth daily as needed for allergies. 90 tablet 3  . NICODERM CQ 14 MG/24HR patch Place 14 mg onto the skin daily. As directed  0  . NICORETTE 4 MG gum As directed  0  . nicotine (NICODERM CQ - DOSED IN MG/24 HR) 7 mg/24hr patch  Place 1 patch (7 mg total) onto the skin daily. 14 patch 0  . Nutritional Supplements (ENSURE NUTRA SHAKE HI-CAL) LIQD Take 1 Can by mouth 2 (two) times daily. 90 Can 11  . ondansetron (ZOFRAN-ODT) 4 MG disintegrating tablet Take 1 tablet by mouth daily as needed for nausea or vomiting.   0  . pantoprazole (PROTONIX) 40 MG tablet Take 1 tablet (40 mg total) by mouth daily. 90 tablet 3  . polyethylene glycol (MIRALAX / GLYCOLAX) packet Take 17 g by mouth daily as needed for mild constipation or moderate constipation. 14 each 0  . RA ASPIRIN EC ADULT LOW ST 81 MG EC tablet Take 1 tablet by mouth daily.  0  . spironolactone (ALDACTONE) 25 MG tablet Take 1 tablet (25 mg total) by mouth daily. 30 tablet 2   No current facility-administered medications for this visit.   Family History  Problem Relation Age of Onset  . Diabetes Mother   . Ovarian cancer Sister   . Ovarian cancer Sister   . Colon cancer Sister    Social History   Social History  . Marital Status: Married    Spouse Name: N/A  . Number of Children: N/A  . Years of Education: N/A   Social History Main Topics  . Smoking status: Former Smoker -- 0.75 packs/day for 50 years    Types: Cigarettes  . Smokeless tobacco: Never Used     Comment: Wants to quit. Using both the patch and gum.  Marland Kitchen Alcohol Use: 6.0 oz/week    10 Glasses of wine per week     Comment: Wine.  . Drug Use: No  . Sexual Activity: Not Asked     Comment: pt. given condoms   Other Topics Concern  . None   Social History Narrative   Review of Systems: Review of Systems  Constitutional: Negative for fever, chills and malaise/fatigue.  HENT: Negative for congestion and sore throat.   Eyes: Negative for blurred vision and pain.  Respiratory: Positive for cough. Negative for sputum production, shortness of breath and wheezing.   Cardiovascular: Negative for palpitations and leg swelling.       Chest pain (chronic)  Gastrointestinal: Positive for heartburn.  Negative for nausea, vomiting, abdominal pain and diarrhea.  Genitourinary: Negative for dysuria and flank pain.  Musculoskeletal: Negative for myalgias and joint pain.  Skin: Negative for itching and rash.  Neurological: Negative for dizziness, sensory change, focal weakness and headaches.   Objective:  Physical Exam: Filed Vitals:   04/09/16 1506  BP: 177/62  Pulse: 59  Temp: 98.3 F (36.8 C)  TempSrc: Oral  Height: 5\' 2"  (1.575  m)  Weight: 133 lb 8 oz (60.555 kg)  SpO2: 100%    Physical Exam  Constitutional: She is oriented to person, place, and time. She appears well-developed and well-nourished. No distress.  HENT:  Head: Normocephalic and atraumatic.  Mouth/Throat: Oropharynx is clear and moist.  No oropharyngeal erythema or exudates  Eyes: EOM are normal. Pupils are equal, round, and reactive to light.  Neck: Neck supple. No tracheal deviation present.  Cardiovascular: Normal rate, regular rhythm and intact distal pulses.  Exam reveals no gallop and no friction rub.   No murmur heard. Bradycardic Grade 3/6 systolic murmur best appreciated at the right upper sternal border.   Pulmonary/Chest: Effort normal and breath sounds normal. No respiratory distress. She has no wheezes. She has no rales.  Abdominal: Soft. Bowel sounds are normal. She exhibits no distension. There is no tenderness.  Musculoskeletal: Normal range of motion. She exhibits no edema.  Neurological: She is alert and oriented to person, place, and time.  Skin: Skin is warm and dry. No rash noted. She is not diaphoretic. No erythema. No pallor.   Assessment & Plan:

## 2016-04-12 NOTE — Assessment & Plan Note (Signed)
Assessment:  She is currently on Protonix 40 mg daily and reports having continued GERD symptoms despite medication compliance. She has also been having a dry cough for the past 2 weeks.  Plan: -Continue Protonix 40 mg daily -Started Pepcid 20 mg twice daily -Reassess symptoms at next visit

## 2016-04-17 NOTE — Progress Notes (Signed)
Case discussed with Dr. Rathore at the time of the visit.  We reviewed the resident's history and exam and pertinent patient test results.  I agree with the assessment, diagnosis and plan of care documented in the resident's note. 

## 2016-06-12 ENCOUNTER — Ambulatory Visit: Payer: Medicare Other

## 2016-07-30 ENCOUNTER — Encounter: Payer: Self-pay | Admitting: Internal Medicine

## 2016-07-30 ENCOUNTER — Encounter: Payer: Medicare Other | Admitting: Internal Medicine

## 2016-08-08 ENCOUNTER — Other Ambulatory Visit: Payer: Self-pay | Admitting: Internal Medicine

## 2016-08-12 ENCOUNTER — Other Ambulatory Visit: Payer: Self-pay | Admitting: Infectious Diseases

## 2016-08-12 ENCOUNTER — Other Ambulatory Visit: Payer: Self-pay | Admitting: Internal Medicine

## 2016-08-12 DIAGNOSIS — I10 Essential (primary) hypertension: Secondary | ICD-10-CM

## 2016-08-21 ENCOUNTER — Other Ambulatory Visit (HOSPITAL_COMMUNITY)
Admission: RE | Admit: 2016-08-21 | Discharge: 2016-08-21 | Disposition: A | Discharge status: Home / Self Care | Payer: Medicare Other | Source: Ambulatory Visit | Attending: Infectious Diseases | Admitting: Infectious Diseases

## 2016-08-21 ENCOUNTER — Other Ambulatory Visit: Payer: Medicare Other

## 2016-08-21 DIAGNOSIS — Z79899 Other long term (current) drug therapy: Secondary | ICD-10-CM | POA: Diagnosis not present

## 2016-08-21 DIAGNOSIS — Z113 Encounter for screening for infections with a predominantly sexual mode of transmission: Secondary | ICD-10-CM | POA: Diagnosis present

## 2016-08-21 DIAGNOSIS — B20 Human immunodeficiency virus [HIV] disease: Secondary | ICD-10-CM

## 2016-08-21 LAB — LIPID PANEL
Cholesterol: 106 mg/dL — ABNORMAL LOW (ref 125–200)
HDL: 66 mg/dL (ref >=46)
LDL Cholesterol: 29 mg/dL (ref <130)
Total CHOL/HDL Ratio: 1.6 Ratio (ref <=5.0)
Triglycerides: 53 mg/dL (ref <150)
VLDL: 11 mg/dL (ref <30)

## 2016-08-21 LAB — COMPREHENSIVE METABOLIC PANEL
ALT: 10 U/L (ref 6–29)
AST: 17 U/L (ref 10–35)
Albumin: 3.8 g/dL (ref 3.6–5.1)
Alkaline Phosphatase: 61 U/L (ref 33–130)
BUN: 21 mg/dL (ref 7–25)
CO2: 27 mmol/L (ref 20–31)
Calcium: 9.3 mg/dL (ref 8.6–10.4)
Chloride: 110 mmol/L (ref 98–110)
Creat: 0.67 mg/dL (ref 0.60–0.93)
Glucose, Bld: 83 mg/dL (ref 65–99)
Potassium: 3.7 mmol/L (ref 3.5–5.3)
Sodium: 140 mmol/L (ref 135–146)
Total Bilirubin: 0.3 mg/dL (ref 0.2–1.2)
Total Protein: 7.4 g/dL (ref 6.1–8.1)

## 2016-08-21 LAB — CBC
HCT: 37.2 % (ref 35.0–45.0)
Hemoglobin: 11.8 g/dL (ref 11.7–15.5)
MCH: 30.5 pg (ref 27.0–33.0)
MCHC: 31.7 g/dL — ABNORMAL LOW (ref 32.0–36.0)
MCV: 96.1 fL (ref 80.0–100.0)
MPV: 9.7 fL (ref 7.5–12.5)
Platelets: 243 10*3/uL (ref 140–400)
RBC: 3.87 MIL/uL (ref 3.80–5.10)
RDW: 16.5 % — ABNORMAL HIGH (ref 11.0–15.0)
WBC: 5.2 10*3/uL (ref 3.8–10.8)

## 2016-08-21 NOTE — Addendum Note (Signed)
Addended byMeriel Pica F on: 08/21/2016 12:19 PM   Modules accepted: Orders

## 2016-08-22 LAB — RPR

## 2016-08-22 LAB — HIV-1 RNA QUANT-NO REFLEX-BLD
HIV 1 RNA Quant: <20 copies/mL (ref <20)
HIV-1 RNA Quant, Log: <1.3 Log copies/mL (ref <1.30)

## 2016-08-22 LAB — URINE CYTOLOGY ANCILLARY ONLY
Chlamydia: NEGATIVE
Neisseria Gonorrhea: NEGATIVE

## 2016-08-22 LAB — T-HELPER CELL (CD4) - (RCID CLINIC ONLY)
CD4 % Helper T Cell: 26 % — ABNORMAL LOW (ref 33–55)
CD4 T Cell Abs: 490 /uL (ref 400–2700)

## 2016-09-04 ENCOUNTER — Ambulatory Visit: Payer: Medicare Other | Admitting: Infectious Diseases

## 2016-09-09 ENCOUNTER — Other Ambulatory Visit: Payer: Self-pay | Admitting: Internal Medicine

## 2016-09-09 ENCOUNTER — Other Ambulatory Visit: Payer: Self-pay | Admitting: Infectious Diseases

## 2016-09-09 DIAGNOSIS — B20 Human immunodeficiency virus [HIV] disease: Secondary | ICD-10-CM

## 2016-09-09 DIAGNOSIS — I10 Essential (primary) hypertension: Secondary | ICD-10-CM

## 2016-09-10 ENCOUNTER — Other Ambulatory Visit: Payer: Self-pay | Admitting: *Deleted

## 2016-09-10 ENCOUNTER — Encounter: Payer: Medicare Other | Admitting: Internal Medicine

## 2016-09-10 ENCOUNTER — Encounter: Payer: Self-pay | Admitting: Internal Medicine

## 2016-09-10 MED ORDER — ATORVASTATIN CALCIUM 40 MG PO TABS: 40.0000 mg | ORAL_TABLET | Freq: Every day | ORAL | 0 refills | Status: DC

## 2016-09-10 MED ORDER — DOLUTEGRAVIR SODIUM 50 MG PO TABS: 50.0000 mg | ORAL_TABLET | Freq: Every day | ORAL | 3 refills | Status: DC

## 2016-09-16 ENCOUNTER — Encounter: Payer: Self-pay | Admitting: Internal Medicine

## 2016-09-16 ENCOUNTER — Ambulatory Visit: Payer: Medicare Other

## 2016-09-30 ENCOUNTER — Other Ambulatory Visit: Payer: Self-pay | Admitting: Infectious Diseases

## 2016-10-02 ENCOUNTER — Ambulatory Visit: Payer: Medicare Other | Admitting: Infectious Diseases

## 2016-10-20 ENCOUNTER — Other Ambulatory Visit: Payer: Self-pay | Admitting: Internal Medicine

## 2016-10-20 DIAGNOSIS — J45909 Unspecified asthma, uncomplicated: Secondary | ICD-10-CM

## 2016-10-30 ENCOUNTER — Encounter: Payer: Self-pay | Admitting: Internal Medicine

## 2016-10-30 ENCOUNTER — Ambulatory Visit (INDEPENDENT_AMBULATORY_CARE_PROVIDER_SITE_OTHER): Payer: Medicare Other | Admitting: Internal Medicine

## 2016-10-30 VITALS — BP 160/82 | HR 52 | Ht 62.0 in | Wt 136.8 lb

## 2016-10-30 DIAGNOSIS — R001 Bradycardia, unspecified: Secondary | ICD-10-CM | POA: Diagnosis not present

## 2016-10-30 DIAGNOSIS — I119 Hypertensive heart disease without heart failure: Secondary | ICD-10-CM | POA: Diagnosis not present

## 2016-10-30 DIAGNOSIS — F17200 Nicotine dependence, unspecified, uncomplicated: Secondary | ICD-10-CM

## 2016-10-30 NOTE — Patient Instructions (Signed)
Medication Instructions:  Your physician recommends that you continue on your current medications as directed. Please refer to the Current Medication list given to you today.   Labwork: None ordered   Testing/Procedures: None ordered   Follow-Up: Your physician recommends that you schedule a follow-up appointment in: February with Dr Rayann Heman   Any Other Special Instructions Will Be Listed Below (If Applicable).     If you need a refill on your cardiac medications before your next appointment, please call your pharmacy.

## 2016-10-30 NOTE — Progress Notes (Signed)
Electrophysiology Office Note   Date:  10/30/2016   ID:  Nicole Mccormick, DOB 1946-08-20, MRN [id number]  PCP:  Shela Leff, MD  Cardiologist:  Dr Meda Coffee Primary Electrophysiologist: Thompson Grayer, MD    Chief Complaint  Patient presents with  . Bradycardia     History of Present Illness: Nicole Mccormick is a 70 y.o. female who presents today for electrophysiology follow-up.   She has chronic bradycardia.  She has SOB which is multifactoral. She continues to smoke and smells heavily of tobacco today.  She has rare dizziness and fatigue.  I saw her in March and offered pacing which she declined.  Today, she denies symptoms of palpitations, chest pain,  orthopnea, PND, lower extremity edema, claudication, presyncope, syncope, bleeding, or neurologic sequela. The patient is tolerating medications without difficulties and is otherwise without complaint today.    Past Medical History:  Diagnosis Date  . Abnormal Pap smear   . Arthritis   . Asthma   . CHF (congestive heart failure) ([place])   . Complication of anesthesia    "they have a hard time bringing me back" (11/08/2015)  . Coronary artery disease   . GERD (gastroesophageal reflux disease)    previously on aciphex, discontinued november 2012  because patient asymptomatic, and concern about interference with HIV meds.  May try pepcid in the future if symptoms return  . Heart murmur   . Hepatitis C antibody test positive   . HIV infection ([place])    diagnosed before 2008  . Hypertension   . Iron deficiency anemia    Ferritin = 2 in november 2012, started on iron supplemenation  . Seizures ([place])   . Varicosities    Past Surgical History:  Procedure Laterality Date  . ABDOMINAL HYSTERECTOMY    . CARDIAC CATHETERIZATION N/A 11/08/2015   Procedure: Left Heart Cath and Coronary Angiography;  Surgeon: Jettie Booze, MD;  Location: [facility];  Service: Cardiovascular;  Laterality: N/A;  . COLONOSCOPY  10/13/11   small  adenoma, anal condyloma  . COLONOSCOPY  10/13/2011   Procedure: COLONOSCOPY;  Surgeon: Gatha Mayer, MD;  Location: [facility];  Service: Endoscopy;  Laterality: N/A;  . COLONOSCOPY N/A 09/14/2014   Procedure: COLONOSCOPY;  Surgeon: Arta Silence, MD;  Location: [facility] ENDOSCOPY;  Service: Endoscopy;  Laterality: N/A;  . ESOPHAGOGASTRODUODENOSCOPY  10/13/11   small hiatal hernia  . ESOPHAGOGASTRODUODENOSCOPY  10/13/2011   Procedure: ESOPHAGOGASTRODUODENOSCOPY (EGD);  Surgeon: Gatha Mayer, MD;  Location[facility] END[facility]COPY;  Service: Endoscopy;  Laterality: N/A;  . ESOPHAGOGASTRODUODENOSCOPY N/A 09/14/2014   Procedure: ESOPHAGOGASTRODUODENOSCOPY (EGD);  Surgeon: Arta Silence, MD;  Location: [facility] ENDOSCOPY;  Service: Endoscopy;  Laterality: N/A;  . GIVENS CAPSULE STUDY N/A 09/14/2014   Procedure: GIVENS CAPSULE STUDY;  Surgeon: Arta Silence, MD;  Location: [facility] ENDOSCOPY;  Service: Endoscopy;  Laterality: N/A;     Current Outpatient Prescriptions  Medication Sig Dispense Refill  . acyclovir (ZOVIRAX) 400 MG tablet Take 1 tablet (400 mg total) by mouth daily as needed (for flare ups). 30 tablet 3  . albuterol (PROVENTIL) (2.5 MG/3ML) 0.083% nebulizer solution Take 3 mLs (2.5 mg total) by nebulization every 6 (six) hours as needed for wheezing or shortness of breath. 75 mL 12  . amLODipine (NORVASC) 10 MG tablet take 1 tablet by mouth once daily 90 tablet 3  . atorvastatin (LIPITOR) 40 MG tablet take 1 tablet by mouth once daily AT 6 PM 30 tablet 0  . baclofen (LIORESAL) 10 MG tablet  Take 1 tablet (10 mg total) by mouth 3 (three) times daily as needed for muscle spasms. 90 each 0  . chlorthalidone (HYGROTON) 25 MG tablet take 1 tablet by mouth once daily 90 tablet 0  . DESCOVY 200-25 MG tablet take 1 tablet by mouth once daily 90 tablet 3  . diazepam (VALIUM) 2 MG tablet take 1 tablet by mouth every 6 hours if needed for anxiety 30 tablet 0  . dolutegravir (TIVICAY) 50 MG tablet Take 1 tablet (50 mg  total) by mouth daily. 90 tablet 3  . famotidine (PEPCID) 20 MG tablet take 1 tablet by mouth twice a day 60 tablet 0  . ferrous sulfate 325 (65 FE) MG tablet Take 1 tablet (325 mg total) by mouth 3 (three) times daily with meals. 90 tablet 3  . gabapentin (NEURONTIN) 600 MG tablet take 1 tablet by mouth three times a day 90 tablet 0  . guaiFENesin (ROBITUSSIN) 100 MG/5ML SOLN Take 5 mLs (100 mg total) by mouth every 4 (four) hours as needed for cough or to loosen phlegm. 1200 mL 0  . isosorbide mononitrate (IMDUR) 60 MG 24 hr tablet Take 1 tablet (60 mg total) by mouth daily. 90 tablet 3  . NICODERM CQ 14 MG/24HR patch Place 14 mg onto the skin daily. As directed  0  . NICORETTE 4 MG gum As directed  0  . Nutritional Supplements (ENSURE NUTRA SHAKE HI-CAL) LIQD Take 1 Can by mouth 2 (two) times daily. 90 Can 11  . ondansetron (ZOFRAN-ODT) 4 MG disintegrating tablet Take 1 tablet by mouth daily as needed for nausea or vomiting.   0  . pantoprazole (PROTONIX) 40 MG tablet Take 1 tablet (40 mg total) by mouth daily. 90 tablet 3  . polyethylene glycol (MIRALAX / GLYCOLAX) packet Take 17 g by mouth daily as needed for mild constipation or moderate constipation. 14 each 0  . PROAIR HFA 108 (90 Base) MCG/ACT inhaler inhale 2 puffs by mouth every 4 hours if needed for wheezing or shortness of breath 8.5 Inhaler 5  . RA ASPIRIN EC ADULT LOW ST 81 MG EC tablet Take 1 tablet (81 mg total) by mouth daily. 30 tablet 5  . spironolactone (ALDACTONE) 25 MG tablet take 1 tablet by mouth once daily 90 tablet 0  . loratadine (CLARITIN) 10 MG tablet Take 1 tablet (10 mg total) by mouth daily as needed for allergies. (Patient not taking: Reported on 10/30/2016) 90 tablet 3   No current facility-administered medications for this visit.     Allergies:   Penicillins and Avelox [moxifloxacin hcl in nacl]   Social History:  The patient  reports that she has quit smoking. Her smoking use included Cigarettes. She has a  37.50 pack-year smoking history. She has never used smokeless tobacco. She reports that she drinks about 6.0 oz of alcohol per week . She reports that she does not use drugs.   Family History:  The patient's  family history includes Colon cancer in her sister; Diabetes in her mother; Ovarian cancer in her sister and sister.    ROS:  Please see the history of present illness.   All other systems are reviewed and negative.    PHYSICAL EXAM: VS:  BP (!) 160/82   Pulse (!) 52   Ht 5\' 2"  (1.575 m)   Wt 136 lb 12.8 oz (62.1 kg)   SpO2 94%   BMI 25.02 kg/m  , BMI Body mass index is 25.02 kg/m. GEN: Well  nourished, well developed, in no acute distress  HEENT: normal  Neck: no JVD, carotid bruits, or masses Cardiac: bradycardic regular rhythm; no murmurs, rubs, or gallops,no edema  Respiratory:  clear to auscultation bilaterally, normal work of breathing GI: soft, nontender, nondistended, + BS MS: no deformity or atrophy  Skin: warm and dry  Neuro:  Strength and sensation are intact Psych: euthymic mood, full affect  EKG:  EKG is ordered today. The ekg ordered today shows sinus bradycardia 52 bpm LVH   Recent Labs: 11/27/2015: Magnesium 2.2 03/01/2016: TSH 0.81 08/21/2016: ALT 10; BUN 21; Creat 0.67; Hemoglobin 11.8; Platelets 243; Potassium 3.7; Sodium 140    Lipid Panel     Component Value Date/Time   CHOL 106 (L) 08/21/2016 0955   TRIG 53 08/21/2016 0955   HDL 66 08/21/2016 0955   CHOLHDL 1.6 08/21/2016 0955   VLDL 11 08/21/2016 0955   LDLCALC 29 08/21/2016 0955    Wt Readings from Last 3 Encounters:  10/30/16 136 lb 12.8 oz (62.1 kg)  04/09/16 133 lb 8 oz (60.6 kg)  03/01/16 137 lb (62.1 kg)     Other studies Reviewed: Additional studies/ records that were reviewed today include:  Dr Thera Flake notes, prior ekgs, holter, echo    ASSESSMENT AND PLAN:  1.  Sinus bradycardia The patient does have sick sinus syndrome.  Though I suspect that she may feel better with  pacing, her fatigue and SOB are certainly multifactoral.  She has had no prolonged pauses or syncope.  Today, I have offered PPM for symptomatic improvement.  Risks, benefits, alternatives to pacemaker implantation were discussed in detail with the patient today.  She says that she would like to consider pacing but prefers to wait until next year after tax season so that she can pay her rent while out of work.  2. Hypertensive cardioverascular disease Stable No change required today Avoid chronotropic agents  3. Tobacco Smoking cessation is advised.  She smells heavily of smoke today.  Return to see me in February for further discussions of pacing.  She is aware that she may contact my office should she desire to proceed earlier.  She should go to the ED should she had syncope or more advanced symptoms of bradycardia.  Current medicines are reviewed at length with the patient today.   The patient does not have concerns regarding her medicines.  The following changes were made today:  none   Signed, Thompson Grayer, MD  10/30/2016 6:13 PM     [address] [place] [address] [phone] (office) [phone] (fax)

## 2016-11-06 ENCOUNTER — Encounter: Payer: Self-pay | Admitting: Infectious Diseases

## 2016-11-06 ENCOUNTER — Telehealth: Payer: Self-pay | Admitting: Pharmacist

## 2016-11-06 ENCOUNTER — Ambulatory Visit (INDEPENDENT_AMBULATORY_CARE_PROVIDER_SITE_OTHER): Payer: Medicare Other | Admitting: Infectious Diseases

## 2016-11-06 VITALS — BP 194/66 | HR 50 | Temp 98.2°F | Ht 62.0 in | Wt 135.0 lb

## 2016-11-06 DIAGNOSIS — Z79899 Other long term (current) drug therapy: Secondary | ICD-10-CM

## 2016-11-06 DIAGNOSIS — I10 Essential (primary) hypertension: Secondary | ICD-10-CM | POA: Diagnosis not present

## 2016-11-06 DIAGNOSIS — J45909 Unspecified asthma, uncomplicated: Secondary | ICD-10-CM | POA: Diagnosis not present

## 2016-11-06 DIAGNOSIS — G5751 Tarsal tunnel syndrome, right lower limb: Secondary | ICD-10-CM | POA: Diagnosis not present

## 2016-11-06 DIAGNOSIS — B20 Human immunodeficiency virus [HIV] disease: Secondary | ICD-10-CM

## 2016-11-06 DIAGNOSIS — K219 Gastro-esophageal reflux disease without esophagitis: Secondary | ICD-10-CM

## 2016-11-06 DIAGNOSIS — R05 Cough: Secondary | ICD-10-CM

## 2016-11-06 DIAGNOSIS — R87622 Low grade squamous intraepithelial lesion on cytologic smear of vagina (LGSIL): Secondary | ICD-10-CM

## 2016-11-06 DIAGNOSIS — R059 Cough, unspecified: Secondary | ICD-10-CM

## 2016-11-06 DIAGNOSIS — B182 Chronic viral hepatitis C: Secondary | ICD-10-CM

## 2016-11-06 DIAGNOSIS — I495 Sick sinus syndrome: Secondary | ICD-10-CM

## 2016-11-06 DIAGNOSIS — F17200 Nicotine dependence, unspecified, uncomplicated: Secondary | ICD-10-CM

## 2016-11-06 DIAGNOSIS — Z113 Encounter for screening for infections with a predominantly sexual mode of transmission: Secondary | ICD-10-CM

## 2016-11-06 MED ORDER — ALBUTEROL SULFATE HFA 108 (90 BASE) MCG/ACT IN AERS: INHALATION_SPRAY | RESPIRATORY_TRACT | 5 refills | Status: DC

## 2016-11-06 MED ORDER — EMTRICITABINE-TENOFOVIR AF 200-25 MG PO TABS: 1.0000 | ORAL_TABLET | Freq: Every day | ORAL | 3 refills | Status: DC

## 2016-11-06 MED ORDER — ENSURE NUTRA SHAKE HI-CAL PO LIQD: 1.0000 | Freq: Two times a day (BID) | ORAL | 11 refills | Status: DC

## 2016-11-06 MED ORDER — DOLUTEGRAVIR SODIUM 50 MG PO TABS: 50.0000 mg | ORAL_TABLET | Freq: Every day | ORAL | 3 refills | Status: DC

## 2016-11-06 MED ORDER — ONDANSETRON 4 MG PO TBDP: 4.0000 mg | ORAL_TABLET | Freq: Every day | ORAL | 3 refills | Status: DC | PRN

## 2016-11-06 MED ORDER — PANTOPRAZOLE SODIUM 40 MG PO TBEC: 40.0000 mg | DELAYED_RELEASE_TABLET | Freq: Every day | ORAL | 3 refills | Status: DC

## 2016-11-06 MED ORDER — SPIRONOLACTONE 25 MG PO TABS: 25.0000 mg | ORAL_TABLET | Freq: Every day | ORAL | 3 refills | Status: DC

## 2016-11-06 MED ORDER — ALBUTEROL SULFATE (2.5 MG/3ML) 0.083% IN NEBU: 2.5000 mg | INHALATION_SOLUTION | Freq: Four times a day (QID) | RESPIRATORY_TRACT | 12 refills | Status: DC | PRN

## 2016-11-06 MED ORDER — AMLODIPINE BESYLATE 10 MG PO TABS: 10.0000 mg | ORAL_TABLET | Freq: Every day | ORAL | 3 refills | Status: DC

## 2016-11-06 MED ORDER — FAMOTIDINE 20 MG PO TABS: 20.0000 mg | ORAL_TABLET | Freq: Two times a day (BID) | ORAL | 3 refills | Status: DC

## 2016-11-06 MED ORDER — FERROUS SULFATE 325 (65 FE) MG PO TABS: 325.0000 mg | ORAL_TABLET | Freq: Three times a day (TID) | ORAL | 3 refills | Status: DC

## 2016-11-06 MED ORDER — ATORVASTATIN CALCIUM 40 MG PO TABS: ORAL_TABLET | ORAL | 3 refills | Status: DC

## 2016-11-06 NOTE — Progress Notes (Signed)
Requested to contact pharmacy for refill history on HTN medications. Pharmacist at [facility] states patient is generally consistent with refills but has not filled chlorthalidone in over 1 month. Patient is due for refills on other medications at this time, so I asked pharmacist to refill medications today. Patient was notified of medication refills at the pharmacy and advised to contact me if any questions. Patient verbalized understanding.

## 2016-11-06 NOTE — Progress Notes (Signed)
   Subjective:    Patient ID: Nicole Mccormick, female    DOB: 12/28/1945, 70 y.o.   MRN: [id number]  HPI 70 yo F with hx of HIV+, Hep C, adm to hospital 10- to 10-8 with HgB of 5.7, She underwent EGD and colonoscopy which did not show active bleeding. She had capsule endoscopy which suggested small AVMs. She was started on protonix and due to this her ART was changed to prezcobix (off the ATVr). Then changed to DTGV/descovy. She was adm to hospital this year with fatigue and SOB- found on cath to have "complex calcific RCA disease, distal PDA with diffuse disease. Medical therapy was recommended." She was also eval for pacer due to bradycardia. Still has fatigue but doesn't feel like she needs one. Is scared of procedure.   Has been having a "cold"- chills, congestion, mucous, no fever, cough, slight headaches. Has tried multiple OTCs without relief: robitussin, allegra, zyrtec, claritin. No nasal inhalers.   Needs refills on everything.  Has 21 grandkids, great grand kids.  Just bought travel trailer.   HIV 1 RNA Quant (copies/mL)  Date Value  08/21/2016 <20  01/26/2015 <20  09/12/2014 <20   CD4 T Cell Abs (/uL)  Date Value  08/21/2016 490  10/30/2015 550  01/26/2015 520    Review of Systems  Constitutional: Positive for chills. Negative for appetite change, fever and unexpected weight change.  HENT: Positive for congestion and rhinorrhea.   Respiratory: Positive for cough. Negative for shortness of breath.   Cardiovascular: Positive for palpitations. Negative for chest pain and leg swelling.  Gastrointestinal: Negative for constipation and diarrhea.  Genitourinary: Negative for difficulty urinating.       Objective:   Physical Exam  Constitutional: She appears well-developed and well-nourished.  HENT:  Mouth/Throat: No oropharyngeal exudate.  No sinus tenderness  Eyes: EOM are normal. Pupils are equal, round, and reactive to light.  Neck: Neck supple.  Cardiovascular: Normal  heart sounds.  Bradycardia present.   Pulmonary/Chest: Effort normal and breath sounds normal.  Abdominal: Soft. Bowel sounds are normal. There is no tenderness. There is no rebound.  Musculoskeletal: She exhibits no edema.  Lymphadenopathy:    She has no cervical adenopathy.      Assessment & Plan:

## 2016-11-06 NOTE — Assessment & Plan Note (Signed)
She is doing very well Continue her current art Will see her back in 6 months with labs prior Has gotten flu shot Has gotten shingles vax.

## 2016-11-06 NOTE — Assessment & Plan Note (Signed)
States she will quit smoking when she has pacer.

## 2016-11-06 NOTE — Assessment & Plan Note (Signed)
I encouraged her to f/u with CV and get pacer.  She is still fatigued. And bradycardic.

## 2016-11-06 NOTE — Assessment & Plan Note (Signed)
She has sx of sinusitis, most likely allergic. No rx written today.

## 2016-11-06 NOTE — Assessment & Plan Note (Signed)
Will recheck her HCV RNA

## 2016-11-06 NOTE — Assessment & Plan Note (Signed)
Will get her PAP list.

## 2016-11-08 ENCOUNTER — Encounter: Payer: Self-pay | Admitting: Internal Medicine

## 2016-11-11 ENCOUNTER — Encounter: Payer: Self-pay | Admitting: Internal Medicine

## 2016-11-12 ENCOUNTER — Encounter: Payer: Medicare Other | Admitting: Internal Medicine

## 2016-12-17 ENCOUNTER — Encounter: Payer: Self-pay | Admitting: Internal Medicine

## 2017-01-02 ENCOUNTER — Other Ambulatory Visit: Payer: Self-pay | Admitting: *Deleted

## 2017-01-02 DIAGNOSIS — B20 Human immunodeficiency virus [HIV] disease: Secondary | ICD-10-CM

## 2017-01-02 MED ORDER — ENSURE NUTRA SHAKE HI-CAL PO LIQD: 1.0000 | Freq: Two times a day (BID) | ORAL | 11 refills | Status: DC

## 2017-01-31 ENCOUNTER — Ambulatory Visit: Payer: Medicare Other | Admitting: Internal Medicine

## 2017-02-03 ENCOUNTER — Encounter: Payer: Self-pay | Admitting: Internal Medicine

## 2017-02-18 ENCOUNTER — Other Ambulatory Visit: Payer: Self-pay | Admitting: Infectious Diseases

## 2017-03-05 ENCOUNTER — Ambulatory Visit (INDEPENDENT_AMBULATORY_CARE_PROVIDER_SITE_OTHER): Payer: Medicare Other | Admitting: Internal Medicine

## 2017-03-05 ENCOUNTER — Encounter (INDEPENDENT_AMBULATORY_CARE_PROVIDER_SITE_OTHER): Payer: Self-pay

## 2017-03-05 VITALS — BP 130/70 | HR 50 | Ht 61.0 in | Wt 138.0 lb

## 2017-03-05 DIAGNOSIS — I495 Sick sinus syndrome: Secondary | ICD-10-CM

## 2017-03-05 DIAGNOSIS — R001 Bradycardia, unspecified: Secondary | ICD-10-CM

## 2017-03-05 NOTE — Patient Instructions (Signed)
Medication Instructions:  Your physician recommends that you continue on your current medications as directed. Please refer to the Current Medication list given to you today.  Labwork: None ordered.  Testing/Procedures: None ordered.  Follow-Up: Your physician recommends that you schedule a follow-up appointment as needed.   Any Other Special Instructions Will Be Listed Below (If Applicable).     If you need a refill on your cardiac medications before your next appointment, please call your pharmacy.   

## 2017-03-05 NOTE — Progress Notes (Signed)
Electrophysiology Office Note   Date:  03/05/2017   ID:  Nicole Mccormick, DOB 09-27-1946, MRN [id number]  PCP:  [name], MD  Cardiologist:  Dr Meda Coffee Primary Electrophysiologist: Thompson Grayer, MD    Chief Complaint  Patient presents with  . Follow-up    Bradycardia      History of Present Illness: Nicole Mccormick is a 71 y.o. female who presents today for electrophysiology follow-up.   She has chronic bradycardia.  She is minimally symtomatic.   Rare SOB.  Denies dizziness.  Currently working 40 hrs per week without difficulty and states "I feel great". Today, she denies symptoms of palpitations, chest pain,  orthopnea, PND, lower extremity edema, claudication, presyncope, syncope, bleeding, or neurologic sequela. The patient is tolerating medications without difficulties and is otherwise without complaint today.    Past Medical History:  Diagnosis Date  . Abnormal Pap smear   . Arthritis   . Asthma   . CHF (congestive heart failure) ([place])   . Complication of anesthesia    "they have a hard time bringing me back" (11/08/2015)  . Coronary artery disease   . GERD (gastroesophageal reflux disease)    previously on aciphex, discontinued november 2012  because patient asymptomatic, and concern about interference with HIV meds.  May try pepcid in the future if symptoms return  . Heart murmur   . Hepatitis C antibody test positive   . HIV infection ([place])    diagnosed before 2008  . Hypertension   . Iron deficiency anemia    Ferritin = 2 in november 2012, started on iron supplemenation  . Seizures ([place])   . Varicosities    Past Surgical History:  Procedure Laterality Date  . ABDOMINAL HYSTERECTOMY    . CARDIAC CATHETERIZATION N/A 11/08/2015   Procedure: Left Heart Cath and Coronary Angiography;  Surgeon: Jettie Booze, MD;  Location: [facility];  Service: Cardiovascular;  Laterality: N/A;  . COLONOSCOPY  10/13/11   small adenoma, anal condyloma  . COLONOSCOPY   10/13/2011   Procedure: COLONOSCOPY;  Surgeon: Gatha Mayer, MD;  Location: [facility];  Service: Endoscopy;  Laterality: N/A;  . COLONOSCOPY N/A 09/14/2014   Procedure: COLONOSCOPY;  Surgeon: Arta Silence, MD;  Location: [facility] ENDOSCOPY;  Service: Endoscopy;  Laterality: N/A;  . ESOPHAGOGASTRODUODENOSCOPY  10/13/11   small hiatal hernia  . ESOPHAGOGASTRODUODENOSCOPY  10/13/2011   Procedure: ESOPHAGOGASTRODUODENOSCOPY (EGD);  Surgeon: Gatha Mayer, MD;  Location: [facility] ENDOSCOPY;  Service: Endoscopy;  Laterality: N/A;  . ESOPHAGOGASTRODUODENOSCOPY N/A 09/14/2014   Procedure: ESOPHAGOGASTRODUODENOSCOPY (EGD);  Surgeon: Arta Silence, MD;  Location: [facility] ENDOSCOPY;  Service: Endoscopy;  Laterality: N/A;  . GIVENS CAPSULE STUDY N/A 09/14/2014   Procedure: GIVENS CAPSULE STUDY;  Surgeon: Arta Silence, MD;  Location: [facility] ENDOSCOPY;  Service: Endoscopy;  Laterality: N/A;     Current Outpatient Prescriptions  Medication Sig Dispense Refill  . acyclovir (ZOVIRAX) 400 MG tablet Take 1 tablet (400 mg total) by mouth daily as needed (for flare ups). 30 tablet 3  . albuterol (PROAIR HFA) 108 (90 Base) MCG/ACT inhaler inhale 2 puffs by mouth every 4 hours if needed for wheezing or shortness of breath 8.5 Inhaler 5  . albuterol (PROVENTIL) (2.5 MG/3ML) 0.083% nebulizer solution Take 3 mLs (2.5 mg total) by nebulization every 6 (six) hours as needed for wheezing or shortness of breath. 75 mL 12  . amLODipine (NORVASC) 10 MG tablet Take 1 tablet (10 mg total) by mouth daily. 90 tablet 3  .  atorvastatin (LIPITOR) 40 MG tablet take 1 tablet by mouth once daily AT 6 PM 90 tablet 3  . baclofen (LIORESAL) 10 MG tablet Take 1 tablet (10 mg total) by mouth 3 (three) times daily as needed for muscle spasms. 90 each 0  . chlorthalidone (HYGROTON) 25 MG tablet take 1 tablet by mouth once daily 90 tablet 0  . diazepam (VALIUM) 2 MG tablet take 1 tablet by mouth every 6 hours if needed for anxiety 30 tablet 0  .  dolutegravir (TIVICAY) 50 MG tablet Take 1 tablet (50 mg total) by mouth daily. 90 tablet 3  . emtricitabine-tenofovir AF (DESCOVY) 200-25 MG tablet Take 1 tablet by mouth daily. 90 tablet 3  . famotidine (PEPCID) 20 MG tablet Take 1 tablet (20 mg total) by mouth 2 (two) times daily. 60 tablet 3  . ferrous sulfate 325 (65 FE) MG tablet Take 1 tablet (325 mg total) by mouth 3 (three) times daily with meals. 90 tablet 3  . gabapentin (NEURONTIN) 600 MG tablet take 1 tablet by mouth three times a day 90 tablet 0  . guaiFENesin (ROBITUSSIN) 100 MG/5ML SOLN Take 5 mLs (100 mg total) by mouth every 4 (four) hours as needed for cough or to loosen phlegm. 1200 mL 0  . isosorbide mononitrate (IMDUR) 60 MG 24 hr tablet Take 1 tablet (60 mg total) by mouth daily. 90 tablet 3  . loratadine (CLARITIN) 10 MG tablet Take 1 tablet (10 mg total) by mouth daily as needed for allergies. 90 tablet 3  . NICORETTE 4 MG gum As directed  0  . Nutritional Supplements (ENSURE NUTRA SHAKE HI-CAL) LIQD Take 1 Can by mouth 2 (two) times daily. 90 Can 11  . ondansetron (ZOFRAN-ODT) 4 MG disintegrating tablet Take 1 tablet (4 mg total) by mouth daily as needed for nausea or vomiting. 20 tablet 3  . pantoprazole (PROTONIX) 40 MG tablet Take 1 tablet (40 mg total) by mouth daily. 90 tablet 3  . RA ASPIRIN EC ADULT LOW ST 81 MG EC tablet Take 1 tablet (81 mg total) by mouth daily. 30 tablet 5  . spironolactone (ALDACTONE) 25 MG tablet Take 1 tablet (25 mg total) by mouth daily. 90 tablet 3   No current facility-administered medications for this visit.     Allergies:   Penicillins and Avelox [moxifloxacin hcl in nacl]   Social History:  The patient  reports that she has quit smoking. Her smoking use included Cigarettes. She has a 37.50 pack-year smoking history. She has never used smokeless tobacco. She reports that she drinks about 6.0 oz of alcohol per week . She reports that she does not use drugs.   Family History:  The  patient's  family history includes Colon cancer in her sister; Diabetes in her mother; Ovarian cancer in her sister and sister.    ROS:  Please see the history of present illness.   All other systems are reviewed and negative.    PHYSICAL EXAM: VS:  BP 130/70   Pulse (!) 50   Ht 5\' 1"  (1.549 m)   Wt 138 lb (62.6 kg)   SpO2 93%   BMI 26.07 kg/m  , BMI Body mass index is 26.07 kg/m. GEN: Well nourished, well developed, in no acute distress  HEENT: normal  Neck: no JVD, carotid bruits, or masses Cardiac: bradycardic regular rhythm  Respiratory:  clear to auscultation bilaterally, normal work of breathing GI: soft, nontender, nondistended, + BS MS: no deformity or atrophy  Skin: warm and dry  Neuro:  Strength and sensation are intact Psych: euthymic mood, full affect  EKG:  EKG is ordered today. The ekg ordered today shows sinus bradycardia 50 bpm, LVH   Recent Labs: 08/21/2016: ALT 10; BUN 21; Creat 0.67; Hemoglobin 11.8; Platelets 243; Potassium 3.7; Sodium 140    Lipid Panel     Component Value Date/Time   CHOL 106 (L) 08/21/2016 0955   TRIG 53 08/21/2016 0955   HDL 66 08/21/2016 0955   CHOLHDL 1.6 08/21/2016 0955   VLDL 11 08/21/2016 0955   LDLCALC 29 08/21/2016 0955    Wt Readings from Last 3 Encounters:  03/05/17 138 lb (62.6 kg)  11/06/16 135 lb (61.2 kg)  10/30/16 136 lb 12.8 oz (62.1 kg)      ASSESSMENT AND PLAN:  1.  Sinus bradycardia Minimally symptomatic She is very clear that she is not interested in pacing.  She will contact my office if she develops presyncope, syncope, or worsening symptoms of bradycardia.  2. Hypertensive cardioverascular disease Stable No change required today Avoid chronotropic agents  3. Tobacco Smoking cessation is advised.    I will see as needed going forward   Signed, Thompson Grayer, MD  03/05/2017 11:09 AM     [place] [address] [phone]  (office) [phone] (fax)

## 2017-03-13 ENCOUNTER — Encounter (HOSPITAL_COMMUNITY): Payer: Self-pay | Admitting: Emergency Medicine

## 2017-03-13 ENCOUNTER — Emergency Department (HOSPITAL_COMMUNITY): Payer: Medicare Other

## 2017-03-13 ENCOUNTER — Emergency Department (HOSPITAL_COMMUNITY)
Admission: EM | Admit: 2017-03-13 | Discharge: 2017-03-13 | Disposition: A | Discharge status: Home / Self Care | Payer: Medicare Other | Attending: Emergency Medicine | Admitting: Emergency Medicine

## 2017-03-13 DIAGNOSIS — R0781 Pleurodynia: Secondary | ICD-10-CM

## 2017-03-13 DIAGNOSIS — M25561 Pain in right knee: Secondary | ICD-10-CM

## 2017-03-13 DIAGNOSIS — Z87891 Personal history of nicotine dependence: Secondary | ICD-10-CM | POA: Diagnosis not present

## 2017-03-13 DIAGNOSIS — I509 Heart failure, unspecified: Secondary | ICD-10-CM | POA: Insufficient documentation

## 2017-03-13 DIAGNOSIS — Y939 Activity, unspecified: Secondary | ICD-10-CM | POA: Diagnosis not present

## 2017-03-13 DIAGNOSIS — Z955 Presence of coronary angioplasty implant and graft: Secondary | ICD-10-CM | POA: Insufficient documentation

## 2017-03-13 DIAGNOSIS — Y92481 Parking lot as the place of occurrence of the external cause: Secondary | ICD-10-CM | POA: Insufficient documentation

## 2017-03-13 DIAGNOSIS — Z79899 Other long term (current) drug therapy: Secondary | ICD-10-CM | POA: Insufficient documentation

## 2017-03-13 DIAGNOSIS — J45909 Unspecified asthma, uncomplicated: Secondary | ICD-10-CM | POA: Insufficient documentation

## 2017-03-13 DIAGNOSIS — Y999 Unspecified external cause status: Secondary | ICD-10-CM | POA: Diagnosis not present

## 2017-03-13 DIAGNOSIS — S299XXA Unspecified injury of thorax, initial encounter: Secondary | ICD-10-CM | POA: Diagnosis not present

## 2017-03-13 DIAGNOSIS — I2510 Atherosclerotic heart disease of native coronary artery without angina pectoris: Secondary | ICD-10-CM | POA: Insufficient documentation

## 2017-03-13 DIAGNOSIS — I110 Hypertensive heart disease with heart failure: Secondary | ICD-10-CM | POA: Diagnosis not present

## 2017-03-13 DIAGNOSIS — R0789 Other chest pain: Secondary | ICD-10-CM

## 2017-03-13 DIAGNOSIS — S8991XA Unspecified injury of right lower leg, initial encounter: Secondary | ICD-10-CM | POA: Diagnosis not present

## 2017-03-13 MED ORDER — ACETAMINOPHEN 500 MG PO TABS: 500.0000 mg | ORAL_TABLET | Freq: Four times a day (QID) | ORAL | 0 refills | Status: DC | PRN
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
## 2017-04-05 ENCOUNTER — Other Ambulatory Visit: Payer: Self-pay | Admitting: Infectious Diseases

## 2017-04-05 DIAGNOSIS — B20 Human immunodeficiency virus [HIV] disease: Secondary | ICD-10-CM

## 2017-05-06 ENCOUNTER — Other Ambulatory Visit: Payer: Medicare Other

## 2017-05-15 ENCOUNTER — Telehealth: Payer: Self-pay

## 2017-05-15 ENCOUNTER — Other Ambulatory Visit: Payer: Medicare Other

## 2017-05-15 DIAGNOSIS — Z113 Encounter for screening for infections with a predominantly sexual mode of transmission: Secondary | ICD-10-CM

## 2017-05-15 DIAGNOSIS — Z79899 Other long term (current) drug therapy: Secondary | ICD-10-CM | POA: Diagnosis not present

## 2017-05-15 DIAGNOSIS — B20 Human immunodeficiency virus [HIV] disease: Secondary | ICD-10-CM

## 2017-05-15 LAB — CBC
HCT: 43.5 % (ref 35.0–45.0)
Hemoglobin: 14.3 g/dL (ref 11.7–15.5)
MCH: 33 pg (ref 27.0–33.0)
MCHC: 32.9 g/dL (ref 32.0–36.0)
MCV: 100.5 fL — ABNORMAL HIGH (ref 80.0–100.0)
MPV: 9.9 fL (ref 7.5–12.5)
Platelets: 222 10*3/uL (ref 140–400)
RBC: 4.33 MIL/uL (ref 3.80–5.10)
RDW: 14.8 % (ref 11.0–15.0)
WBC: 6.2 10*3/uL (ref 3.8–10.8)

## 2017-05-15 NOTE — Telephone Encounter (Signed)
Called Pt to inform her that she has an appointment on 05/20/2017. Pt states that she's aware however she states that she forgot to come in and do her labs prior to her Dr visit. Pt states that she'd like to come in today after 3 as a lab walk in. I advised her to be here before 1600. Pt has FMLA paperwork as well. Notes are in triage.

## 2017-05-16 LAB — LIPID PANEL
Cholesterol: 102 mg/dL (ref <200)
HDL: 65 mg/dL (ref >50)
LDL Cholesterol: 24 mg/dL (ref <100)
Total CHOL/HDL Ratio: 1.6 Ratio (ref <5.0)
Triglycerides: 63 mg/dL (ref <150)
VLDL: 13 mg/dL (ref <30)

## 2017-05-16 LAB — COMPREHENSIVE METABOLIC PANEL
ALT: 11 U/L (ref 6–29)
AST: 15 U/L (ref 10–35)
Albumin: 3.8 g/dL (ref 3.6–5.1)
Alkaline Phosphatase: 64 U/L (ref 33–130)
BUN: 18 mg/dL (ref 7–25)
CO2: 24 mmol/L (ref 20–31)
Calcium: 9.5 mg/dL (ref 8.6–10.4)
Chloride: 107 mmol/L (ref 98–110)
Creat: 0.83 mg/dL (ref 0.60–0.93)
Glucose, Bld: 86 mg/dL (ref 65–99)
Potassium: 3.4 mmol/L — ABNORMAL LOW (ref 3.5–5.3)
Sodium: 140 mmol/L (ref 135–146)
Total Bilirubin: 0.3 mg/dL (ref 0.2–1.2)
Total Protein: 7.3 g/dL (ref 6.1–8.1)

## 2017-05-16 LAB — RPR

## 2017-05-16 LAB — T-HELPER CELL (CD4) - (RCID CLINIC ONLY)
CD4 % Helper T Cell: 26 % — ABNORMAL LOW (ref 33–55)
CD4 T Cell Abs: 480 /uL (ref 400–2700)

## 2017-05-20 ENCOUNTER — Encounter: Payer: Self-pay | Admitting: Infectious Diseases

## 2017-05-20 ENCOUNTER — Ambulatory Visit (INDEPENDENT_AMBULATORY_CARE_PROVIDER_SITE_OTHER): Payer: Medicare Other | Admitting: Infectious Diseases

## 2017-05-20 VITALS — BP 198/72 | HR 54 | Temp 98.1°F | Ht 62.5 in | Wt 138.0 lb

## 2017-05-20 DIAGNOSIS — I119 Hypertensive heart disease without heart failure: Secondary | ICD-10-CM

## 2017-05-20 DIAGNOSIS — R87622 Low grade squamous intraepithelial lesion on cytologic smear of vagina (LGSIL): Secondary | ICD-10-CM

## 2017-05-20 DIAGNOSIS — R05 Cough: Secondary | ICD-10-CM | POA: Diagnosis not present

## 2017-05-20 DIAGNOSIS — R059 Cough, unspecified: Secondary | ICD-10-CM

## 2017-05-20 DIAGNOSIS — B20 Human immunodeficiency virus [HIV] disease: Secondary | ICD-10-CM | POA: Diagnosis not present

## 2017-05-20 DIAGNOSIS — Z79899 Other long term (current) drug therapy: Secondary | ICD-10-CM | POA: Diagnosis not present

## 2017-05-20 DIAGNOSIS — K219 Gastro-esophageal reflux disease without esophagitis: Secondary | ICD-10-CM

## 2017-05-20 DIAGNOSIS — Z1239 Encounter for other screening for malignant neoplasm of breast: Secondary | ICD-10-CM

## 2017-05-20 DIAGNOSIS — Z0000 Encounter for general adult medical examination without abnormal findings: Secondary | ICD-10-CM | POA: Diagnosis not present

## 2017-05-20 DIAGNOSIS — Z1231 Encounter for screening mammogram for malignant neoplasm of breast: Secondary | ICD-10-CM

## 2017-05-20 DIAGNOSIS — Z113 Encounter for screening for infections with a predominantly sexual mode of transmission: Secondary | ICD-10-CM | POA: Diagnosis not present

## 2017-05-20 LAB — HIV-1 RNA QUANT-NO REFLEX-BLD
HIV 1 RNA Quant: <20 copies/mL
HIV-1 RNA Quant, Log: <1.3 Log copies/mL

## 2017-05-20 MED ORDER — CETIRIZINE HCL 10 MG PO TBDP: 1.0000 | ORAL_TABLET | Freq: Every day | ORAL | 3 refills | Status: DC

## 2017-05-20 MED ORDER — ONDANSETRON 4 MG PO TBDP: 4.0000 mg | ORAL_TABLET | Freq: Every day | ORAL | 3 refills | Status: DC | PRN

## 2017-05-20 MED ORDER — CETIRIZINE HCL 10 MG PO TABS: 10.0000 mg | ORAL_TABLET | Freq: Every day | ORAL | Status: DC

## 2017-05-20 MED ORDER — CYCLOBENZAPRINE HCL 5 MG PO TABS: 5.0000 mg | ORAL_TABLET | Freq: Three times a day (TID) | ORAL | 0 refills | Status: DC | PRN

## 2017-05-20 NOTE — Assessment & Plan Note (Signed)
Will give her rx for zyrtec.

## 2017-05-20 NOTE — Assessment & Plan Note (Signed)
She is going to get new IMTS referral

## 2017-05-20 NOTE — Addendum Note (Signed)
Addended by: Darletta Moll on: 05/20/2017 04:25 PM   Modules accepted: Orders

## 2017-05-20 NOTE — Progress Notes (Signed)
Patient referred back to [facility] for Primary Care at her request via email to [facility]. Landis Gandy, RN

## 2017-05-20 NOTE — Assessment & Plan Note (Signed)
She appears to be doing well HIV RNA pending Will continue her current art Will see her back in 6 months.
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
## 2017-05-20 NOTE — Assessment & Plan Note (Signed)
Will set her up for repeat

## 2017-05-20 NOTE — Addendum Note (Signed)
Addended by: Landis Gandy on: 05/20/2017 05:05 PM   Modules accepted: Orders

## 2017-05-20 NOTE — Assessment & Plan Note (Signed)
Will set her up for mammo

## 2017-06-17 ENCOUNTER — Telehealth: Payer: Self-pay | Admitting: *Deleted

## 2017-06-17 NOTE — Telephone Encounter (Signed)
Patient called stating she needed an Rx for [place] for her "milk". Called her back and explained that her BMI (body mass index) does not qualify her for Ensure; as it is in the normal range of 24. She stated, "but it helps me". Advised her she can purchase it over the counter; but unfortunately she does not currently qualify for an Rx. It is not currently on her med list and she did not talk to Dr. Johnnye Sima about it at her last office visit.

## 2017-07-16 ENCOUNTER — Other Ambulatory Visit: Payer: Self-pay | Admitting: Infectious Diseases

## 2017-07-16 DIAGNOSIS — B20 Human immunodeficiency virus [HIV] disease: Secondary | ICD-10-CM

## 2017-08-10 ENCOUNTER — Other Ambulatory Visit: Payer: Self-pay | Admitting: Internal Medicine

## 2017-08-13 ENCOUNTER — Ambulatory Visit (HOSPITAL_COMMUNITY)
Admission: EM | Admit: 2017-08-13 | Discharge: 2017-08-13 | Disposition: A | Discharge status: Home / Self Care | Payer: Medicare Other | Attending: Physician Assistant | Admitting: Physician Assistant

## 2017-08-13 ENCOUNTER — Encounter (HOSPITAL_COMMUNITY): Payer: Self-pay | Admitting: *Deleted

## 2017-08-13 DIAGNOSIS — S1096XA Insect bite of unspecified part of neck, initial encounter: Secondary | ICD-10-CM

## 2017-08-13 DIAGNOSIS — Z21 Asymptomatic human immunodeficiency virus [HIV] infection status: Secondary | ICD-10-CM | POA: Diagnosis not present

## 2017-08-13 DIAGNOSIS — W57XXXA Bitten or stung by nonvenomous insect and other nonvenomous arthropods, initial encounter: Secondary | ICD-10-CM

## 2017-08-13 MED ORDER — TRIAMCINOLONE ACETONIDE 0.1 % EX CREA: 1.0000 "application " | TOPICAL_CREAM | Freq: Two times a day (BID) | CUTANEOUS | 0 refills | Status: DC

## 2017-08-13 NOTE — ED Triage Notes (Signed)
Patient reports while at work last night she developed pain and itching with raised area around collar area. Areas painful to touch and itching. Appears to be multiple bites around collar area of chest and neck.

## 2017-08-13 NOTE — Discharge Instructions (Signed)
Take zyrtec to help with itchiness. Triamcinolone cream on affected area for itchiness and inflammation. Ice compress as needed. Monitor for any worsening of symptoms, spreading redness, increased warmth, increased pain, follow up here or with PCP for further evaluation. If experiencing trouble breathing, trouble swallowing, swelling of the throat, go to the emergency department for further evaluation.  Your blood pressure was elevated at this visit. Follow up with PCP for further management and treatment of high blood pressure. If experiencing chest pain, shortness of breath, headache, dizziness, weakness, blurry vision, go to the emergency room for further evaluation.

## 2017-08-13 NOTE — ED Provider Notes (Signed)
[place]    CSN: [id number] Arrival date & time: 08/13/17  1540     History   Chief Complaint Chief Complaint  Patient presents with  . Insect Bite    HPI Nicole Mccormick is a 71 y.o. female.   71 year old female with history of HIV, hypertension, seizures, CHF, comes in for 1 day history of bug bites around her neck. States there is itchiness, and pain. There is surrounding erythema around the bug bites, that is not spreading and without increased warmth. She took a Benadryl earlier today without relief. Denies fever, chills, night sweats. Denies trouble breathing, trouble swallowing, swelling of the throat. HIV is well controlled, patient states last viral load is none detectable.       Past Medical History:  Diagnosis Date  . Abnormal Pap smear   . Arthritis   . Asthma   . CHF (congestive heart failure) ([place])   . Complication of anesthesia    "they have a hard time bringing me back" (11/08/2015)  . Coronary artery disease   . GERD (gastroesophageal reflux disease)    previously on aciphex, discontinued november 2012  because patient asymptomatic, and concern about interference with HIV meds.  May try pepcid in the future if symptoms return  . Heart murmur   . Hepatitis C antibody test positive   . HIV infection ([place])    diagnosed before 2008  . Hypertension   . Iron deficiency anemia    Ferritin = 2 in november 2012, started on iron supplemenation  . Seizures ([place])   . Varicosities     Patient Active Problem List   Diagnosis Date Noted  . Cough 04/12/2016  . Sick sinus syndrome ([place]) 02/28/2016  . Hypertensive cardiovascular disease 02/28/2016  . Bilateral carotid bruits 02/12/2016  . LVH (left ventricular hypertrophy) 01/15/2016  . GI bleed 11/07/2015  . Anxiety 07/22/2015  . Seasonal allergies 07/22/2015  . Asthma, chronic 07/22/2015  . Health care maintenance 02/17/2015  . Hepatitis C 08/07/2014  . LGSIL Pap smear of vagina 01/16/2012  . Anal  condylomata 11/18/2011  . CAD (coronary artery disease) 08/07/2011  . Essential hypertension, benign 12/25/2010  . Human immunodeficiency virus (HIV) disease ([place]) 01/23/2007  . TOBACCO ABUSE 01/23/2007  . CATARACT NEC 01/23/2007  . Gastroesophageal reflux disease 01/23/2007    Past Surgical History:  Procedure Laterality Date  . ABDOMINAL HYSTERECTOMY    . CARDIAC CATHETERIZATION N/A 11/08/2015   Procedure: Left Heart Cath and Coronary Angiography;  Surgeon: Jettie Booze, MD;  Location: [place] [facility];  Service: Cardiovascular;  Laterality: N/A;  . COLONOSCOPY  10/13/11   small adenoma, anal condyloma  . COLONOSCOPY  10/13/2011   Procedure: COLONOSCOPY;  Surgeon: Gatha Mayer, MD;  Location: [place];  Service: Endoscopy;  Laterality: N/A;  . COLONOSCOPY N/A 09/14/2014   Procedure: COLONOSCOPY;  Surgeon: Arta Silence, MD;  Location: [facility] ENDOSCOPY;  Service: Endoscopy;  Laterality: N/A;  . ESOPHAGOGASTRODUODENOSCOPY  10/13/11   small hiatal hernia  . ESOPHAGOGASTRODUODENOSCOPY  10/13/2011   Procedure: ESOPHAGOGASTRODUODENOSCOPY (EGD);  Surgeon: Gatha Mayer, MD;  Location: [facility] ENDOSCOPY;  Service: Endoscopy;  Laterality: N/A;  . ESOPHAGOGASTRODUODENOSCOPY N/A 09/14/2014   Procedure: ESOPHAGOGASTRODUODENOSCOPY (EGD);  Surgeon: Arta Silence, MD;  Location: [facility] ENDOSCOPY;  Service: Endoscopy;  Laterality: N/A;  . GIVENS CAPSULE STUDY N/A 09/14/2014   Procedure: GIVENS CAPSULE STUDY;  Surgeon: Arta Silence, MD;  Location: [facility] ENDOSCOPY;  Service: Endoscopy;  Laterality: N/A;    OB History  Gravida Para Term Preterm AB Living   6 5     1 5    SAB TAB Ectopic Multiple Live Births   1               Home Medications    Prior to Admission medications   Medication Sig Start Date End Date Taking? Authorizing Provider  acyclovir (ZOVIRAX) 400 MG tablet Take 1 tablet (400 mg total) by mouth daily as needed (for flare ups). 09/16/14   Bartholomew Crews, MD  albuterol  [facility] HFA) [phone] Base) MCG/ACT inhaler inhale 2 puffs by mouth every 4 hours if needed for wheezing or shortness of breath 11/06/16   Campbell Riches, MD  albuterol (PROVENTIL) (2.5 MG/3ML) 0.083% nebulizer solution Take 3 mLs (2.5 mg total) by nebulization every 6 (six) hours as needed for wheezing or shortness of breath. 11/06/16   Campbell Riches, MD  amLODipine (NORVASC) 10 MG tablet Take 1 tablet (10 mg total) by mouth daily. 11/06/16   Campbell Riches, MD  atorvastatin (LIPITOR) 40 MG tablet take 1 tablet by mouth once daily AT 6 PM 11/06/16   Campbell Riches, MD  Cetirizine HCl (ZYRTEC ALLERGY) 10 MG TBDP Take 1 tablet by mouth daily. 05/20/17   Campbell Riches, MD  chlorthalidone (HYGROTON) 25 MG tablet take 1 tablet by mouth once daily 10/01/16   Campbell Riches, MD  cyclobenzaprine (FLEXERIL) 5 MG tablet Take 1 tablet (5 mg total) by mouth 3 (three) times daily as needed for muscle spasms. 05/20/17   Campbell Riches, MD  diazepam (VALIUM) 2 MG tablet take 1 tablet by mouth every 6 hours if needed for anxiety 07/21/15   Shela Leff, MD  emtricitabine-tenofovir AF (DESCOVY) 200-25 MG tablet Take 1 tablet by mouth daily. 11/06/16   Campbell Riches, MD  famotidine (PEPCID) 20 MG tablet Take 1 tablet (20 mg total) by mouth 2 (two) times daily. 11/06/16   Campbell Riches, MD  ferrous sulfate 325 (65 FE) MG tablet Take 1 tablet (325 mg total) by mouth 3 (three) times daily with meals. Patient taking differently: Take 325 mg by mouth daily with breakfast.  11/06/16   Campbell Riches, MD  gabapentin (NEURONTIN) 600 MG tablet take 1 tablet by mouth three times a day 10/22/16   Shela Leff, MD  guaiFENesin (ROBITUSSIN) 100 MG/5ML SOLN Take 5 mLs (100 mg total) by mouth every 4 (four) hours as needed for cough or to loosen phlegm. 01/15/16   Emokpae, Ejiroghene E, MD  isosorbide mononitrate (IMDUR) 60 MG 24 hr tablet Take 1 tablet (60 mg total) by mouth daily.  01/15/16   Dorothy Spark, MD  loratadine (CLARITIN) 10 MG tablet Take 1 tablet (10 mg total) by mouth daily as needed for allergies. 07/21/15   Shela Leff, MD  NICOTINE TD Place 1 patch onto the skin daily as needed (smoking cessation).    [provider]  Nutritional Supplements (ENSURE NUTRA SHAKE HI-CAL) LIQD Take 1 Can by mouth 2 (two) times daily. 01/02/17   Campbell Riches, MD  ondansetron (ZOFRAN-ODT) 4 MG disintegrating tablet Take 1 tablet (4 mg total) by mouth daily as needed for nausea or vomiting. 05/20/17   Campbell Riches, MD  pantoprazole (PROTONIX) 40 MG tablet Take 1 tablet (40 mg total) by mouth daily. 11/06/16   Campbell Riches, MD  RA ASPIRIN EC ADULT LOW ST 81 MG EC tablet Take 1 tablet (81 mg total) by  mouth daily. 04/12/16   Shela Leff, MD  spironolactone (ALDACTONE) 25 MG tablet Take 1 tablet (25 mg total) by mouth daily. 11/06/16   Campbell Riches, MD  TIVICAY 50 MG tablet take 1 tablet by mouth once daily 04/07/17   Campbell Riches, MD  triamcinolone cream (KENALOG) 0.1 % Apply 1 application topically 2 (two) times daily. 08/13/17   Ok Edwards, PA-C    Family History Family History  Problem Relation Age of Onset  . Diabetes Mother   . Ovarian cancer Sister   . Colon cancer Sister     Social History Social History  Substance Use Topics  . Smoking status: Former Smoker    Packs/day: 0.75    Years: 50.00    Types: Cigarettes  . Smokeless tobacco: Never Used     Comment: Wants to quit. Using both the patch and gum.  Marland Kitchen Alcohol use 6.0 oz/week    10 Glasses of wine per week     Comment: Wine.     Allergies   Penicillins and Avelox [moxifloxacin hcl in nacl]   Review of Systems Review of Systems  Reason unable to perform ROS: See HPI as above.     Physical Exam Triage Vital Signs ED Triage Vitals  Enc Vitals Group     BP 08/13/17 1609 (!) 201/79     Pulse Rate 08/13/17 1609 (!) 50     Resp 08/13/17 1609 17      Temp 08/13/17 1609 98 F (36.7 C)     Temp Source 08/13/17 1609 Oral     SpO2 08/13/17 1609 100 %     Weight --      Height --      Head Circumference --      Peak Flow --      Pain Score 08/13/17 1610 8     Pain Loc --      Pain Edu? --      Excl. in [place]? --    No data found.   Updated Vital Signs BP (!) 201/79 Comment: denies CP, dizziness, patient took meds this am  Pulse (!) 50   Temp 98 F (36.7 C) (Oral)   Resp 17   SpO2 100%   Visual Acuity Right Eye Distance:   Left Eye Distance:   Bilateral Distance:    Right Eye Near:   Left Eye Near:    Bilateral Near:     Physical Exam  Constitutional: She is oriented to person, place, and time. She appears well-developed and well-nourished. No distress.  HENT:  Head: Normocephalic and atraumatic.  Eyes: Pupils are equal, round, and reactive to light. Conjunctivae are normal.  Cardiovascular: Regular rhythm.  Bradycardia present.  Exam reveals no gallop and no friction rub.   Murmur heard. Pulmonary/Chest: Effort normal and breath sounds normal. No respiratory distress. She has no wheezes. She has no rales.  Neurological: She is alert and oriented to person, place, and time.  Skin:  See pictures as below         UC Treatments / Results  Labs (all labs ordered are listed, but only abnormal results are displayed) Labs Reviewed - No data to display  EKG  EKG Interpretation None       Radiology No results found.  Procedures Procedures (including critical care time)  Medications Ordered in UC Medications - No data to display   Initial Impression / Assessment and Plan / UC Course  I have reviewed the triage vital  signs and the nursing notes.  Pertinent labs & imaging results that were available during my care of the patient were reviewed by me and considered in my medical decision making (see chart for details).  Clinical Course as of Aug 13 1706  Wed Aug 13, 2017  1705 Patient with elevated blood  pressure, which she states is normal for her, denies chest pain, shortness of breath, headache, dizziness, weakness, blurry vision.   [AY]    Clinical Course User Index [AY] Ok Edwards, PA-C   Start Zyrtec for itchiness. Triamcinolone cream on the affected area. Ice compresses needed. Return precautions given.  Patient with elevated blood pressure at this visit, which she states is normal for her. Currently denies symptoms. She is bradycardic, which has been evaluated by cardiology, where she declined pacemaker. Patient to follow up with PCP for further management of hypertension, return precautions given.  Final Clinical Impressions(s) / UC Diagnoses   Final diagnoses:  Insect bite, initial encounter    New Prescriptions New Prescriptions   TRIAMCINOLONE CREAM (KENALOG) 0.1 %    Apply 1 application topically 2 (two) times daily.      Ok Edwards, PA-C 08/13/17 [phone]

## 2017-08-20 ENCOUNTER — Other Ambulatory Visit: Payer: Self-pay | Admitting: Infectious Diseases

## 2017-08-20 DIAGNOSIS — K5521 Angiodysplasia of colon with hemorrhage: Secondary | ICD-10-CM

## 2017-08-20 DIAGNOSIS — K219 Gastro-esophageal reflux disease without esophagitis: Secondary | ICD-10-CM

## 2017-08-29 ENCOUNTER — Other Ambulatory Visit: Payer: Self-pay | Admitting: Physician Assistant

## 2017-08-29 DIAGNOSIS — R1012 Left upper quadrant pain: Secondary | ICD-10-CM | POA: Diagnosis not present

## 2017-08-29 DIAGNOSIS — R1032 Left lower quadrant pain: Secondary | ICD-10-CM

## 2017-08-29 DIAGNOSIS — R1084 Generalized abdominal pain: Secondary | ICD-10-CM | POA: Diagnosis not present

## 2017-08-29 DIAGNOSIS — K5900 Constipation, unspecified: Secondary | ICD-10-CM | POA: Diagnosis not present

## 2017-09-03 ENCOUNTER — Ambulatory Visit
Admission: RE | Admit: 2017-09-03 | Discharge: 2017-09-03 | Disposition: A | Discharge status: Home / Self Care | Payer: Medicare Other | Source: Ambulatory Visit | Attending: Physician Assistant | Admitting: Physician Assistant

## 2017-09-03 DIAGNOSIS — R1032 Left lower quadrant pain: Secondary | ICD-10-CM

## 2017-09-03 DIAGNOSIS — R1012 Left upper quadrant pain: Secondary | ICD-10-CM | POA: Diagnosis not present

## 2017-09-03 DIAGNOSIS — R1084 Generalized abdominal pain: Secondary | ICD-10-CM

## 2017-09-03 MED ORDER — IOPAMIDOL (ISOVUE-300) INJECTION 61%
100.0000 mL | Freq: Once | INTRAVENOUS | Status: AC | PRN
  Administered 2017-09-03: 100 mL via INTRAVENOUS

## 2017-09-08 ENCOUNTER — Other Ambulatory Visit: Payer: Self-pay | Admitting: Physician Assistant

## 2017-09-08 DIAGNOSIS — N289 Disorder of kidney and ureter, unspecified: Secondary | ICD-10-CM

## 2017-09-11 ENCOUNTER — Encounter: Payer: Self-pay | Admitting: Obstetrics & Gynecology

## 2017-09-15 ENCOUNTER — Encounter: Payer: Self-pay | Admitting: Internal Medicine

## 2017-09-15 ENCOUNTER — Ambulatory Visit (INDEPENDENT_AMBULATORY_CARE_PROVIDER_SITE_OTHER): Payer: Medicare Other | Admitting: Internal Medicine

## 2017-09-15 ENCOUNTER — Other Ambulatory Visit: Payer: Self-pay | Admitting: Infectious Diseases

## 2017-09-15 VITALS — BP 216/86 | HR 51 | Ht 62.0 in | Wt 133.8 lb

## 2017-09-15 DIAGNOSIS — I119 Hypertensive heart disease without heart failure: Secondary | ICD-10-CM | POA: Diagnosis not present

## 2017-09-15 DIAGNOSIS — F17200 Nicotine dependence, unspecified, uncomplicated: Secondary | ICD-10-CM | POA: Diagnosis not present

## 2017-09-15 DIAGNOSIS — B20 Human immunodeficiency virus [HIV] disease: Secondary | ICD-10-CM

## 2017-09-15 DIAGNOSIS — I10 Essential (primary) hypertension: Secondary | ICD-10-CM | POA: Diagnosis not present

## 2017-09-15 DIAGNOSIS — I495 Sick sinus syndrome: Secondary | ICD-10-CM | POA: Diagnosis not present

## 2017-09-15 DIAGNOSIS — R072 Precordial pain: Secondary | ICD-10-CM

## 2017-09-15 DIAGNOSIS — J45909 Unspecified asthma, uncomplicated: Secondary | ICD-10-CM

## 2017-09-15 MED ORDER — CHLORTHALIDONE 25 MG PO TABS: 25.0000 mg | ORAL_TABLET | Freq: Every day | ORAL | 3 refills | Status: DC

## 2017-09-15 MED ORDER — SPIRONOLACTONE 25 MG PO TABS: 25.0000 mg | ORAL_TABLET | Freq: Every day | ORAL | 3 refills | Status: DC

## 2017-09-15 MED ORDER — AMLODIPINE BESYLATE 10 MG PO TABS: 10.0000 mg | ORAL_TABLET | Freq: Every day | ORAL | 3 refills | Status: DC

## 2017-09-15 MED ORDER — ISOSORBIDE MONONITRATE ER 60 MG PO TB24: 60.0000 mg | ORAL_TABLET | Freq: Every day | ORAL | 3 refills | Status: DC

## 2017-09-15 NOTE — Progress Notes (Signed)
PCP: Campbell Riches, MD Primary Cardiologist: Dr Meda Coffee Primary EP: Dr Rayann Heman  Nicole Mccormick is a 71 y.o. female who presents today for routine electrophysiology followup.  Since last being seen in our clinic, the patient reports doing reasonably well.  She has missed multiple primary care appointments and therefore has been released from their practice.  She is having trouble finding a PCP and is therefore having trouble getting medication refills.  BP is very elevated.  She has not taken her BP medicines recently.  With elevation in BP, she has noticed some SOB and occasional palpitations.   Previously, I had planned to see her as needed.  She has not currently having EP complaints.  Today, she denies symptoms of chest pain,   lower extremity edema, dizziness, presyncope, or syncope.  The patient is otherwise without complaint today.   Past Medical History:  Diagnosis Date  . Abnormal Pap smear   . Arthritis   . Asthma   . CHF (congestive heart failure) ([place])   . Complication of anesthesia    "they have a hard time bringing me back" (11/08/2015)  . Coronary artery disease   . GERD (gastroesophageal reflux disease)    previously on aciphex, discontinued november 2012  because patient asymptomatic, and concern about interference with HIV meds.  May try pepcid in the future if symptoms return  . Heart murmur   . Hepatitis C antibody test positive   . HIV infection ([place])    diagnosed before 2008  . Hypertension   . Iron deficiency anemia    Ferritin = 2 in november 2012, started on iron supplemenation  . Seizures ([place])   . Varicosities    Past Surgical History:  Procedure Laterality Date  . ABDOMINAL HYSTERECTOMY    . CARDIAC CATHETERIZATION N/A 11/08/2015   Procedure: Left Heart Cath and Coronary Angiography;  Surgeon: Jettie Booze, MD;  Location: [facility] CV LAB;  Service: Cardiovascular;  Laterality: N/A;  . COLONOSCOPY  10/13/11   small adenoma, anal condyloma  .  COLONOSCOPY  10/13/2011   Procedure: COLONOSCOPY;  Surgeon: Gatha Mayer, MD;  Location: [place];  Service: Endoscopy;  Laterality: N/A;  . COLONOSCOPY N/A 09/14/2014   Procedure: COLONOSCOPY;  Surgeon: Arta Silence, MD;  Location: [facility] ENDOSCOPY;  Service: Endoscopy;  Laterality: N/A;  . ESOPHAGOGASTRODUODENOSCOPY  10/13/11   small hiatal hernia  . ESOPHAGOGASTRODUODENOSCOPY  10/13/2011   Procedure: ESOPHAGOGASTRODUODENOSCOPY (EGD);  Surgeon: Gatha Mayer, MD;  Location: [facility] ENDOSCOPY;  Service: Endoscopy;  Laterality: N/A;  . ESOPHAGOGASTRODUODENOSCOPY N/A 09/14/2014   Procedure: ESOPHAGOGASTRODUODENOSCOPY (EGD);  Surgeon: Arta Silence, MD;  Location: [facility] ENDOSCOPY;  Service: Endoscopy;  Laterality: N/A;  . GIVENS CAPSULE STUDY N/A 09/14/2014   Procedure: GIVENS CAPSULE STUDY;  Surgeon: Arta Silence, MD;  Location: [facility] ENDOSCOPY;  Service: Endoscopy;  Laterality: N/A;    ROS- all systems are reviewed and negatives except as per HPI above  Current Outpatient Prescriptions  Medication Sig Dispense Refill  . acyclovir (ZOVIRAX) 400 MG tablet Take 1 tablet (400 mg total) by mouth daily as needed (for flare ups). 30 tablet 3  . albuterol (PROVENTIL) (2.5 MG/3ML) 0.083% nebulizer solution Take 3 mLs (2.5 mg total) by nebulization every 6 (six) hours as needed for wheezing or shortness of breath. 75 mL 12  . amLODipine (NORVASC) 10 MG tablet Take 1 tablet (10 mg total) by mouth daily. 90 tablet 3  . atorvastatin (LIPITOR) 40 MG tablet take 1 tablet by mouth once  daily AT 6 PM 90 tablet 3  . Cetirizine HCl (ZYRTEC ALLERGY) 10 MG TBDP Take 1 tablet by mouth daily. 90 tablet 3  . chlorthalidone (HYGROTON) 25 MG tablet take 1 tablet by mouth once daily 90 tablet 0  . cyclobenzaprine (FLEXERIL) 5 MG tablet take 1 tablet by mouth three times a day if needed for muscle spasm 30 tablet 0  . diazepam (VALIUM) 2 MG tablet take 1 tablet by mouth every 6 hours if needed for anxiety 30 tablet 0  .  emtricitabine-tenofovir AF (DESCOVY) 200-25 MG tablet Take 1 tablet by mouth daily. 90 tablet 3  . famotidine (PEPCID) 20 MG tablet Take 1 tablet (20 mg total) by mouth 2 (two) times daily. 60 tablet 3  . ferrous sulfate 325 (65 FE) MG tablet Take 1 tablet (325 mg total) by mouth 3 (three) times daily with meals. (Patient taking differently: Take 325 mg by mouth daily with breakfast. ) 90 tablet 3  . gabapentin (NEURONTIN) 600 MG tablet take 1 tablet by mouth three times a day 90 tablet 0  . guaiFENesin (ROBITUSSIN) 100 MG/5ML SOLN Take 5 mLs (100 mg total) by mouth every 4 (four) hours as needed for cough or to loosen phlegm. 1200 mL 0  . isosorbide mononitrate (IMDUR) 60 MG 24 hr tablet Take 1 tablet (60 mg total) by mouth daily. 90 tablet 3  . loratadine (CLARITIN) 10 MG tablet Take 1 tablet (10 mg total) by mouth daily as needed for allergies. 90 tablet 3  . NICOTINE TD Place 1 patch onto the skin daily as needed (smoking cessation).    . Nutritional Supplements (ENSURE NUTRA SHAKE HI-CAL) LIQD Take 1 Can by mouth 2 (two) times daily. 90 Can 11  . ondansetron (ZOFRAN-ODT) 4 MG disintegrating tablet Take 1 tablet (4 mg total) by mouth daily as needed for nausea or vomiting. 20 tablet 3  . pantoprazole (PROTONIX) 40 MG tablet Take 1 tablet (40 mg total) by mouth daily. 90 tablet 3  . PROAIR HFA 108 (90 Base) MCG/ACT inhaler inhale 2 puffs by mouth every 4 hours if needed for wheezing or shortness of breath 8.5 g 0  . RA ASPIRIN EC ADULT LOW ST 81 MG EC tablet Take 1 tablet (81 mg total) by mouth daily. 30 tablet 5  . spironolactone (ALDACTONE) 25 MG tablet Take 1 tablet (25 mg total) by mouth daily. 90 tablet 3  . TIVICAY 50 MG tablet take 1 tablet by mouth once daily 90 tablet 0  . triamcinolone cream (KENALOG) 0.1 % Apply 1 application topically 2 (two) times daily. 30 g 0   No current facility-administered medications for this visit.     Physical Exam: Vitals:   09/15/17 1559  BP: (!)  216/86  Pulse: (!) 51  SpO2: 94%  Weight: 133 lb 12.8 oz (60.7 kg)  Height: 5\' 2"  (1.575 m)    GEN- The patient is well appearing, alert and oriented x 3 today.   Head- normocephalic, atraumatic Eyes-  Sclera clear, conjunctiva pink Ears- hearing intact Oropharynx- clear Lungs- Clear to ausculation bilaterally, normal work of breathing Heart- bradycardic regular rhythm, no murmurs, rubs or gallops, PMI not laterally displaced GI- soft, NT, ND, + BS Extremities- no clubbing, cyanosis, or edema  EKG tracing ordered today is personally reviewed and shows sinus bradycardia 51 bpm, PR 190 msec, QRS 98 msec, Qtc 453 msec, LVH  Assessment and Plan:  1. Sinus bradycardia Minimally symptomatic She wishes to avoid pacing Return as  needed  2. Hypertensive cardiovascular disease Importance of compliance with medicines discussed today She has not been taking them recently.  As she has medicare and medicaid, costs of medicines should be very low.  She needs to reestablish with primary care.  We have given information for [facility] to hopefully help establish with a [facility] primary care physician.  She is instructed to call them.  3. Tobacco She is not ready to quit I have advised cessation today  She is advised to follow-up with primary care and also Dr Meda Coffee I will see as needed going forward  Thompson Grayer MD, [facility] 09/15/2017 4:10 PM

## 2017-09-15 NOTE — Patient Instructions (Signed)
Medication Instructions:  Your physician recommends that you continue on your current medications as directed. Please refer to the Current Medication list given to you today.  -- If you need a refill on your cardiac medications before your next appointment, please call your pharmacy. --  Labwork: None ordered  Testing/Procedures: None ordered  Follow-Up: Your physician wants you to follow-up in: 3 months with Dr. Meda Coffee.   Thank you for choosing [facility]!!   Frederik Schmidt, RN [phone]  Any Other Special Instructions Will Be Listed Below (If Applicable).

## 2017-09-26 DIAGNOSIS — R1032 Left lower quadrant pain: Secondary | ICD-10-CM | POA: Diagnosis not present

## 2017-09-26 DIAGNOSIS — N289 Disorder of kidney and ureter, unspecified: Secondary | ICD-10-CM | POA: Diagnosis not present

## 2017-09-26 DIAGNOSIS — K5900 Constipation, unspecified: Secondary | ICD-10-CM | POA: Diagnosis not present

## 2017-09-29 ENCOUNTER — Ambulatory Visit
Admission: RE | Admit: 2017-09-29 | Discharge: 2017-09-29 | Disposition: A | Discharge status: Home / Self Care | Payer: Medicare Other | Source: Ambulatory Visit | Attending: Physician Assistant | Admitting: Physician Assistant

## 2017-09-29 DIAGNOSIS — R109 Unspecified abdominal pain: Secondary | ICD-10-CM | POA: Diagnosis not present

## 2017-09-29 DIAGNOSIS — N289 Disorder of kidney and ureter, unspecified: Secondary | ICD-10-CM

## 2017-09-29 MED ORDER — GADOBENATE DIMEGLUMINE 529 MG/ML IV SOLN
12.0000 mL | Freq: Once | INTRAVENOUS | Status: AC | PRN
  Administered 2017-09-29: 12 mL via INTRAVENOUS

## 2017-10-22 ENCOUNTER — Encounter: Payer: Medicare Other | Admitting: Obstetrics & Gynecology

## 2017-10-27 ENCOUNTER — Other Ambulatory Visit: Payer: Self-pay | Admitting: *Deleted

## 2017-10-27 DIAGNOSIS — B20 Human immunodeficiency virus [HIV] disease: Secondary | ICD-10-CM

## 2017-10-27 DIAGNOSIS — J45909 Unspecified asthma, uncomplicated: Secondary | ICD-10-CM

## 2017-10-27 MED ORDER — ALBUTEROL SULFATE HFA 108 (90 BASE) MCG/ACT IN AERS: INHALATION_SPRAY | RESPIRATORY_TRACT | 2 refills | Status: DC

## 2017-10-27 MED ORDER — CYCLOBENZAPRINE HCL 5 MG PO TABS: ORAL_TABLET | ORAL | 2 refills | Status: DC

## 2017-11-10 ENCOUNTER — Other Ambulatory Visit: Payer: Medicare Other

## 2017-11-10 DIAGNOSIS — Z79899 Other long term (current) drug therapy: Secondary | ICD-10-CM

## 2017-11-10 DIAGNOSIS — B20 Human immunodeficiency virus [HIV] disease: Secondary | ICD-10-CM

## 2017-11-10 DIAGNOSIS — Z113 Encounter for screening for infections with a predominantly sexual mode of transmission: Secondary | ICD-10-CM

## 2017-11-11 LAB — COMPREHENSIVE METABOLIC PANEL
AG Ratio: 1.1 (calc) (ref 1.0–2.5)
ALT: 8 U/L (ref 6–29)
AST: 15 U/L (ref 10–35)
Albumin: 4 g/dL (ref 3.6–5.1)
Alkaline phosphatase (APISO): 70 U/L (ref 33–130)
BUN/Creatinine Ratio: 32 (calc) — ABNORMAL HIGH (ref 6–22)
BUN: 32 mg/dL — ABNORMAL HIGH (ref 7–25)
CO2: 28 mmol/L (ref 20–32)
Calcium: 9.8 mg/dL (ref 8.6–10.4)
Chloride: 105 mmol/L (ref 98–110)
Creat: 1 mg/dL — ABNORMAL HIGH (ref 0.60–0.93)
Globulin: 3.5 g/dL (calc) (ref 1.9–3.7)
Glucose, Bld: 89 mg/dL (ref 65–99)
Potassium: 4.1 mmol/L (ref 3.5–5.3)
Sodium: 141 mmol/L (ref 135–146)
Total Bilirubin: 0.4 mg/dL (ref 0.2–1.2)
Total Protein: 7.5 g/dL (ref 6.1–8.1)

## 2017-11-11 LAB — LIPID PANEL
Cholesterol: 111 mg/dL (ref <200)
HDL: 59 mg/dL (ref >50)
LDL Cholesterol (Calc): 35 mg/dL (calc)
Non-HDL Cholesterol (Calc): 52 mg/dL (calc) (ref <130)
Total CHOL/HDL Ratio: 1.9 (calc) (ref <5.0)
Triglycerides: 89 mg/dL (ref <150)

## 2017-11-11 LAB — CBC
HCT: 40.4 % (ref 35.0–45.0)
Hemoglobin: 13.8 g/dL (ref 11.7–15.5)
MCH: 33.3 pg — ABNORMAL HIGH (ref 27.0–33.0)
MCHC: 34.2 g/dL (ref 32.0–36.0)
MCV: 97.3 fL (ref 80.0–100.0)
MPV: 10 fL (ref 7.5–12.5)
Platelets: 220 10*3/uL (ref 140–400)
RBC: 4.15 10*6/uL (ref 3.80–5.10)
RDW: 13.2 % (ref 11.0–15.0)
WBC: 5.8 10*3/uL (ref 3.8–10.8)

## 2017-11-11 LAB — RPR: RPR Ser Ql: NONREACTIVE

## 2017-11-11 LAB — T-HELPER CELL (CD4) - (RCID CLINIC ONLY)
CD4 % Helper T Cell: 25 % — ABNORMAL LOW (ref 33–55)
CD4 T Cell Abs: 520 /uL (ref 400–2700)

## 2017-11-12 LAB — HIV-1 RNA QUANT-NO REFLEX-BLD
HIV 1 RNA Quant: <20 copies/mL
HIV-1 RNA Quant, Log: <1.3 Log copies/mL

## 2017-11-20 ENCOUNTER — Other Ambulatory Visit: Payer: Self-pay

## 2017-11-20 ENCOUNTER — Emergency Department (HOSPITAL_COMMUNITY)
Admission: EM | Admit: 2017-11-20 | Discharge: 2017-11-20 | Disposition: A | Discharge status: Home / Self Care | Payer: Medicare Other | Attending: Emergency Medicine | Admitting: Emergency Medicine

## 2017-11-20 ENCOUNTER — Encounter (HOSPITAL_COMMUNITY): Payer: Self-pay | Admitting: Emergency Medicine

## 2017-11-20 DIAGNOSIS — Y9301 Activity, walking, marching and hiking: Secondary | ICD-10-CM | POA: Insufficient documentation

## 2017-11-20 DIAGNOSIS — I110 Hypertensive heart disease with heart failure: Secondary | ICD-10-CM | POA: Diagnosis not present

## 2017-11-20 DIAGNOSIS — Z21 Asymptomatic human immunodeficiency virus [HIV] infection status: Secondary | ICD-10-CM | POA: Insufficient documentation

## 2017-11-20 DIAGNOSIS — S39012A Strain of muscle, fascia and tendon of lower back, initial encounter: Secondary | ICD-10-CM | POA: Insufficient documentation

## 2017-11-20 DIAGNOSIS — W000XXA Fall on same level due to ice and snow, initial encounter: Secondary | ICD-10-CM | POA: Diagnosis not present

## 2017-11-20 DIAGNOSIS — Z87891 Personal history of nicotine dependence: Secondary | ICD-10-CM | POA: Insufficient documentation

## 2017-11-20 DIAGNOSIS — Y929 Unspecified place or not applicable: Secondary | ICD-10-CM | POA: Diagnosis not present

## 2017-11-20 DIAGNOSIS — Z79899 Other long term (current) drug therapy: Secondary | ICD-10-CM | POA: Diagnosis not present

## 2017-11-20 DIAGNOSIS — Z880 Allergy status to penicillin: Secondary | ICD-10-CM | POA: Diagnosis not present

## 2017-11-20 DIAGNOSIS — I509 Heart failure, unspecified: Secondary | ICD-10-CM | POA: Diagnosis not present

## 2017-11-20 DIAGNOSIS — Y999 Unspecified external cause status: Secondary | ICD-10-CM | POA: Insufficient documentation

## 2017-11-20 DIAGNOSIS — W19XXXA Unspecified fall, initial encounter: Secondary | ICD-10-CM

## 2017-11-20 DIAGNOSIS — T148XXA Other injury of unspecified body region, initial encounter: Secondary | ICD-10-CM

## 2017-11-20 DIAGNOSIS — S3992XA Unspecified injury of lower back, initial encounter: Secondary | ICD-10-CM | POA: Diagnosis present

## 2017-11-20 MED ORDER — ACETAMINOPHEN 325 MG PO TABS
650.0000 mg | ORAL_TABLET | Freq: Once | ORAL | Status: AC
  Administered 2017-11-20: 650 mg via ORAL
  Filled 2017-11-20: qty 2

## 2017-11-20 NOTE — ED Notes (Signed)
edp aware of bp  

## 2017-11-20 NOTE — ED Triage Notes (Signed)
Pt states she fell in the ice today and hit her left side. Pt did not hit head/no LOC. Pt ambulatory. Pt complaining of left hip- leg pain.

## 2017-11-20 NOTE — Discharge Instructions (Signed)
Please read attached information. If you experience any new or worsening signs or symptoms please return to the emergency room for evaluation. Please follow-up with your primary care provider or specialist as discussed.  Please use Tylenol as needed for discomfort. °

## 2017-11-20 NOTE — ED Provider Notes (Signed)
[facility] EMERGENCY DEPARTMENT Provider Note   CSN: [id number] Arrival date & time: 11/20/17  1113     History   Chief Complaint Chief Complaint  Patient presents with  . Fall    HPI Nicole Mccormick is a 71 y.o. female.  HPI   71 year old female presents status post fall.  Patient reports she was walking when she slipped on some ice.  She landed on her left hip.  She notes minor pain to the left lateral lumbar musculature.  She notes she ambulates without significant pain or difficulty, denies any midline pain, denies any hip pain or pain down into the lower extremities.  No head trauma.  No medications prior to arrival.  Patient denies any abdominal pain.  No neurological deficits.  Past Medical History:  Diagnosis Date  . Abnormal Pap smear   . Arthritis   . Asthma   . CHF (congestive heart failure) ([place])   . Complication of anesthesia    "they have a hard time bringing me back" (11/08/2015)  . Coronary artery disease   . GERD (gastroesophageal reflux disease)    previously on aciphex, discontinued november 2012  because patient asymptomatic, and concern about interference with HIV meds.  May try pepcid in the future if symptoms return  . Heart murmur   . Hepatitis C antibody test positive   . HIV infection ([place])    diagnosed before 2008  . Hypertension   . Iron deficiency anemia    Ferritin = 2 in november 2012, started on iron supplemenation  . Seizures ([place])   . Varicosities     Patient Active Problem List   Diagnosis Date Noted  . Cough 04/12/2016  . Sick sinus syndrome ([place]) 02/28/2016  . Hypertensive cardiovascular disease 02/28/2016  . Bilateral carotid bruits 02/12/2016  . LVH (left ventricular hypertrophy) 01/15/2016  . GI bleed 11/07/2015  . Anxiety 07/22/2015  . Seasonal allergies 07/22/2015  . Asthma, chronic 07/22/2015  . Health care maintenance 02/17/2015  . Hepatitis C 08/07/2014  . LGSIL Pap smear of vagina 01/16/2012  .  Anal condylomata 11/18/2011  . CAD (coronary artery disease) 08/07/2011  . Essential hypertension, benign 12/25/2010  . Human immunodeficiency virus (HIV) disease ([place]) 01/23/2007  . TOBACCO ABUSE 01/23/2007  . CATARACT NEC 01/23/2007  . Gastroesophageal reflux disease 01/23/2007    Past Surgical History:  Procedure Laterality Date  . ABDOMINAL HYSTERECTOMY    . CARDIAC CATHETERIZATION N/A 11/08/2015   Procedure: Left Heart Cath and Coronary Angiography;  Surgeon: Jettie Booze, MD;  Location: [facility];  Service: Cardiovascular;  Laterality: N/A;  . COLONOSCOPY  10/13/11   small adenoma, anal condyloma  . COLONOSCOPY  10/13/2011   Procedure: COLONOSCOPY;  Surgeon: Gatha Mayer, MD;  Location: [facility];  Service: Endoscopy;  Laterality: N/A;  . COLONOSCOPY N/A 09/14/2014   Procedure: COLONOSCOPY;  Surgeon: Arta Silence, MD;  Location: [facility] ENDOSCOPY;  Service: Endoscopy;  Laterality: N/A;  . ESOPHAGOGASTRODUODENOSCOPY  10/13/11   small hiatal hernia  . ESOPHAGOGASTRODUODENOSCOPY  10/13/2011   Procedure: ESOPHAGOGASTRODUODENOSCOPY (EGD);  Surgeon: Gatha Mayer, MD;  Location: [facility] ENDOSCOPY;  Service: Endoscopy;  Laterality: N/A;  . ESOPHAGOGASTRODUODENOSCOPY N/A 09/14/2014   Procedure: ESOPHAGOGASTRODUODENOSCOPY (EGD);  Surgeon: Arta Silence, MD;  Location: [facility] ENDOSCOPY;  Service: Endoscopy;  Laterality: N/A;  . GIVENS CAPSULE STUDY N/A 09/14/2014   Procedure: GIVENS CAPSULE STUDY;  Surgeon: Arta Silence, MD;  Location: [facility] ENDOSCOPY;  Service: Endoscopy;  Laterality: N/A;  OB History    Gravida Para Term Preterm AB Living   6 5     1 5    SAB TAB Ectopic Multiple Live Births   1               Home Medications    Prior to Admission medications   Medication Sig Start Date End Date Taking? Authorizing Provider  acyclovir (ZOVIRAX) 400 MG tablet Take 1 tablet (400 mg total) by mouth daily as needed (for flare ups). 09/16/14   Bartholomew Crews, MD    albuterol [facility] HFA) [phone] Base) MCG/ACT inhaler inhale 2 puffs by mouth every 4 hours if needed for wheezing or shortness of breath 10/27/17   Campbell Riches, MD  albuterol (PROVENTIL) (2.5 MG/3ML) 0.083% nebulizer solution Take 3 mLs (2.5 mg total) by nebulization every 6 (six) hours as needed for wheezing or shortness of breath. 11/06/16   Campbell Riches, MD  amLODipine (NORVASC) 10 MG tablet Take 1 tablet (10 mg total) by mouth daily. 09/15/17   Allred, Jeneen Rinks, MD  atorvastatin (LIPITOR) 40 MG tablet take 1 tablet by mouth once daily AT 6 PM 11/06/16   Campbell Riches, MD  Cetirizine HCl (ZYRTEC ALLERGY) 10 MG TBDP Take 1 tablet by mouth daily. 05/20/17   Campbell Riches, MD  chlorthalidone (HYGROTON) 25 MG tablet Take 1 tablet (25 mg total) by mouth daily. 09/15/17   Allred, Jeneen Rinks, MD  cyclobenzaprine (FLEXERIL) 5 MG tablet take 1 tablet by mouth three times a day if needed for muscle spasm 10/27/17   Campbell Riches, MD  diazepam (VALIUM) 2 MG tablet take 1 tablet by mouth every 6 hours if needed for anxiety 07/21/15   Shela Leff, MD  emtricitabine-tenofovir AF (DESCOVY) 200-25 MG tablet Take 1 tablet by mouth daily. 11/06/16   Campbell Riches, MD  famotidine (PEPCID) 20 MG tablet Take 1 tablet (20 mg total) by mouth 2 (two) times daily. 11/06/16   Campbell Riches, MD  ferrous sulfate 325 (65 FE) MG tablet Take 1 tablet (325 mg total) by mouth 3 (three) times daily with meals. Patient taking differently: Take 325 mg by mouth daily with breakfast.  11/06/16   Campbell Riches, MD  gabapentin (NEURONTIN) 600 MG tablet take 1 tablet by mouth three times a day 10/22/16   Shela Leff, MD  guaiFENesin (ROBITUSSIN) 100 MG/5ML SOLN Take 5 mLs (100 mg total) by mouth every 4 (four) hours as needed for cough or to loosen phlegm. 01/15/16   Emokpae, Ejiroghene E, MD  isosorbide mononitrate (IMDUR) 60 MG 24 hr tablet Take 1 tablet (60 mg total) by mouth daily. 09/15/17    Allred, Jeneen Rinks, MD  loratadine (CLARITIN) 10 MG tablet Take 1 tablet (10 mg total) by mouth daily as needed for allergies. 07/21/15   Shela Leff, MD  NICOTINE TD Place 1 patch onto the skin daily as needed (smoking cessation).    [provider]  Nutritional Supplements (ENSURE NUTRA SHAKE HI-CAL) LIQD Take 1 Can by mouth 2 (two) times daily. 01/02/17   Campbell Riches, MD  ondansetron (ZOFRAN-ODT) 4 MG disintegrating tablet Take 1 tablet (4 mg total) by mouth daily as needed for nausea or vomiting. 05/20/17   Campbell Riches, MD  pantoprazole (PROTONIX) 40 MG tablet Take 1 tablet (40 mg total) by mouth daily. 11/06/16   Campbell Riches, MD  RA ASPIRIN EC ADULT LOW ST 81 MG EC tablet Take 1 tablet (81 mg  total) by mouth daily. 04/12/16   Shela Leff, MD  spironolactone (ALDACTONE) 25 MG tablet Take 1 tablet (25 mg total) by mouth daily. 09/15/17   Allred, Jeneen Rinks, MD  TIVICAY 50 MG tablet take 1 tablet by mouth once daily 04/07/17   Campbell Riches, MD  triamcinolone cream (KENALOG) 0.1 % Apply 1 application topically 2 (two) times daily. 08/13/17   Ok Edwards, PA-C    Family History Family History  Problem Relation Age of Onset  . Diabetes Mother   . Ovarian cancer Sister   . Colon cancer Sister     Social History Social History   Tobacco Use  . Smoking status: Former Smoker    Packs/day: 0.75    Years: 50.00    Pack years: 37.50    Types: Cigarettes  . Smokeless tobacco: Never Used  . Tobacco comment: Wants to quit. Using both the patch and gum.  Substance Use Topics  . Alcohol use: Yes    Alcohol/week: 6.0 oz    Types: 10 Glasses of wine per week    Comment: Wine.  . Drug use: No     Allergies   Penicillins and Avelox [moxifloxacin hcl in nacl]   Review of Systems Review of Systems  All other systems reviewed and are negative.    Physical Exam Updated Vital Signs BP (!) 170/78 (BP Location: Right Arm)   Pulse (!) 58   Temp 98.3 F (36.8  C) (Oral)   Resp 16   Ht 5\' 2"  (1.575 m)   Wt 62.1 kg (137 lb)   SpO2 97%   BMI 25.06 kg/m   Physical Exam  Constitutional: She is oriented to person, place, and time. She appears well-developed and well-nourished.  HENT:  Head: Normocephalic and atraumatic.  Eyes: Conjunctivae are normal. Pupils are equal, round, and reactive to light. Right eye exhibits no discharge. Left eye exhibits no discharge. No scleral icterus.  Neck: Normal range of motion. No JVD present. No tracheal deviation present.  Pulmonary/Chest: Effort normal. No stridor.  Abdominal: Soft. She exhibits no distension and no mass. There is no tenderness. There is no rebound and no guarding. No hernia.  Musculoskeletal:  No CT or L-spine tenderness, minimal tenderness to palpation of the left lateral lumbar musculature, hip stable bilateral with both AP and lateral compression, nontender to palpation, bilateral lower extremities nontender to palpation full active pain-free range of motion, patient ambulates without pain.  Neurological: She is alert and oriented to person, place, and time. Coordination normal.  Psychiatric: She has a normal mood and affect. Her behavior is normal. Judgment and thought content normal.  Nursing note and vitals reviewed.    ED Treatments / Results  Labs (all labs ordered are listed, but only abnormal results are displayed) Labs Reviewed - No data to display  EKG  EKG Interpretation None       Radiology No results found.  Procedures Procedures (including critical care time)  Medications Ordered in ED Medications  acetaminophen (TYLENOL) tablet 650 mg (not administered)     Initial Impression / Assessment and Plan / ED Course  I have reviewed the triage vital signs and the nursing notes.  Pertinent labs & imaging results that were available during my care of the patient were reviewed by me and considered in my medical decision making (see chart for details).       Final Clinical Impressions(s) / ED Diagnoses   Final diagnoses:  Fall, initial encounter  Muscle strain  Labs:   Imaging:  Consults:  Therapeutics: tylenol  Discharge Meds:   Assessment/Plan: 71 year old female presents status post fall.  Patient without any bony abnormality, no significant pain with ambulation, this appears muscular in nature.  Patient does not believe she has any fractures, she would like to try pain medicine and rest.  I find this completely reasonable in this otherwise well-appearing patient.  She was given Tylenol, encouraged follow-up with primary care if symptoms persist return to the emergency room if they worsen.  Patient verbalized understanding and agreement to today's plan had no further questions or concerns      ED Discharge Orders    None       Francee Gentile 11/20/17 1241    Tegeler, Gwenyth Allegra, MD 11/20/17 2028

## 2017-11-24 ENCOUNTER — Ambulatory Visit: Payer: Medicare Other | Admitting: Infectious Diseases

## 2017-12-16 ENCOUNTER — Ambulatory Visit (INDEPENDENT_AMBULATORY_CARE_PROVIDER_SITE_OTHER): Payer: Medicare Other | Admitting: Infectious Diseases

## 2017-12-16 VITALS — BP 231/78 | HR 57 | Temp 98.0°F | Wt 136.0 lb

## 2017-12-16 DIAGNOSIS — J45909 Unspecified asthma, uncomplicated: Secondary | ICD-10-CM

## 2017-12-16 DIAGNOSIS — K219 Gastro-esophageal reflux disease without esophagitis: Secondary | ICD-10-CM

## 2017-12-16 DIAGNOSIS — Z23 Encounter for immunization: Secondary | ICD-10-CM | POA: Diagnosis not present

## 2017-12-16 DIAGNOSIS — G5751 Tarsal tunnel syndrome, right lower limb: Secondary | ICD-10-CM | POA: Diagnosis not present

## 2017-12-16 DIAGNOSIS — F17200 Nicotine dependence, unspecified, uncomplicated: Secondary | ICD-10-CM | POA: Diagnosis not present

## 2017-12-16 DIAGNOSIS — R87622 Low grade squamous intraepithelial lesion on cytologic smear of vagina (LGSIL): Secondary | ICD-10-CM | POA: Diagnosis not present

## 2017-12-16 DIAGNOSIS — I10 Essential (primary) hypertension: Secondary | ICD-10-CM

## 2017-12-16 DIAGNOSIS — R072 Precordial pain: Secondary | ICD-10-CM | POA: Diagnosis not present

## 2017-12-16 DIAGNOSIS — B20 Human immunodeficiency virus [HIV] disease: Secondary | ICD-10-CM | POA: Diagnosis not present

## 2017-12-16 DIAGNOSIS — M545 Low back pain, unspecified: Secondary | ICD-10-CM

## 2017-12-16 DIAGNOSIS — Z0000 Encounter for general adult medical examination without abnormal findings: Secondary | ICD-10-CM

## 2017-12-16 DIAGNOSIS — Z1231 Encounter for screening mammogram for malignant neoplasm of breast: Secondary | ICD-10-CM

## 2017-12-16 DIAGNOSIS — G8929 Other chronic pain: Secondary | ICD-10-CM

## 2017-12-16 MED ORDER — CYCLOBENZAPRINE HCL 5 MG PO TABS: ORAL_TABLET | ORAL | 0 refills | Status: DC

## 2017-12-16 MED ORDER — ISOSORBIDE MONONITRATE ER 60 MG PO TB24: 60.0000 mg | ORAL_TABLET | Freq: Every day | ORAL | 0 refills | Status: DC

## 2017-12-16 MED ORDER — ALBUTEROL SULFATE HFA 108 (90 BASE) MCG/ACT IN AERS: INHALATION_SPRAY | RESPIRATORY_TRACT | 2 refills | Status: DC

## 2017-12-16 MED ORDER — DOLUTEGRAVIR SODIUM 50 MG PO TABS: 50.0000 mg | ORAL_TABLET | Freq: Every day | ORAL | 11 refills | Status: DC

## 2017-12-16 MED ORDER — PANTOPRAZOLE SODIUM 40 MG PO TBEC: 40.0000 mg | DELAYED_RELEASE_TABLET | Freq: Every day | ORAL | 0 refills | Status: AC

## 2017-12-16 MED ORDER — AMLODIPINE BESYLATE 10 MG PO TABS: 10.0000 mg | ORAL_TABLET | Freq: Every day | ORAL | 0 refills | Status: DC

## 2017-12-16 MED ORDER — SPIRONOLACTONE 25 MG PO TABS: 25.0000 mg | ORAL_TABLET | Freq: Every day | ORAL | 0 refills | Status: DC

## 2017-12-16 MED ORDER — ALBUTEROL SULFATE (2.5 MG/3ML) 0.083% IN NEBU: 2.5000 mg | INHALATION_SOLUTION | Freq: Four times a day (QID) | RESPIRATORY_TRACT | 3 refills | Status: DC | PRN

## 2017-12-16 MED ORDER — ATORVASTATIN CALCIUM 40 MG PO TABS: ORAL_TABLET | ORAL | 0 refills | Status: AC

## 2017-12-16 MED ORDER — EMTRICITABINE-TENOFOVIR AF 200-25 MG PO TABS: 1.0000 | ORAL_TABLET | Freq: Every day | ORAL | 3 refills | Status: DC

## 2017-12-16 NOTE — Assessment & Plan Note (Signed)
Excellent control on labs from December however she tells me she has been out of medications for 1 month now. Will refill her tivicay and descovy. She will come back to see Dr. Johnnye Sima again in 6 months with labs before her visit.

## 2017-12-16 NOTE — Assessment & Plan Note (Signed)
Not ready to quit. Counseled on how this will help manage her blood pressure.

## 2017-12-16 NOTE — Patient Instructions (Signed)
Will refill your Imdur, Spironolactone and your Norvasc today to restart for your blood pressure treatment. You should be expecting a call for an appointment to follow up for this with the internal medicine clinic to help monitor this for you.  Please continue taking your Tivicay and Descovy once a day.   Return to see Dr. Johnnye Sima in 6 months with labs 2 weeks before.   Mammogram orders have been entered and you should expect a call to arrange an appointment.

## 2017-12-16 NOTE — Assessment & Plan Note (Signed)
Although she is beyond convenitonal ASCCP recommended guidelines being she is HIV+ and has had serial history of abnormal cytologies I counseled should reconsider a pap to ensure no risk for progression. She declined this today.

## 2017-12-16 NOTE — Assessment & Plan Note (Addendum)
Uncontrolled and off medication. We discussed refilling her medications and starting them today vs providing with clonidine in clinic today and normal regimen tomorrow. She prefers home regimen and starting back on it today. Considering she is asymptomatic and she likely has readings in this range all the time I will refill her spironolactone, imdur, amlodipine, statin, protonix and inhalers today. Discussed she needs her to keep her appointment with IM clinic to help manage her comorbid conditions.
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
## 2017-12-16 NOTE — Assessment & Plan Note (Addendum)
Orders placed for screening mammogram. Patient provided information as to who to call for appointment.  Counseled that with her previous history of LSIL would be best to screen for further changes that could be concerning for cervical cancer.  She continues to decline pap smear.  Flu vaccine today

## 2017-12-17 ENCOUNTER — Telehealth: Payer: Self-pay | Admitting: *Deleted

## 2017-12-17 ENCOUNTER — Encounter: Payer: Self-pay | Admitting: Infectious Diseases

## 2017-12-17 NOTE — Assessment & Plan Note (Signed)
Will provide refill on her flexeril but at reduced frequency considering her age and reintroduction of a lot of other medications. She is interested in physical therapy referral as this was helpful last visit.

## 2017-12-17 NOTE — Telephone Encounter (Signed)
Called patient and notified that Dr. Johnnye Sima wanted her referred to a new primary care provider. I asked if she had someone in mind and she said no. She asked that it be later in the day as she works. New patient appts end at 2:00 pm. Patient stated that if she had notice she can request the day off. Appt scheduled with Cammie Sickle for 01/02/18 at 2:00 pm. Called patient back and got her voice mail, left the appt info and asked that she return the call and verify she received the date and time. She is aware the office is located by [facility].

## 2017-12-17 NOTE — Telephone Encounter (Signed)
Thanks jackie!

## 2017-12-29 NOTE — Telephone Encounter (Signed)
Patient returned call and was given information about appointment 01/02/18. She was given address and phone number as well.

## 2018-01-02 ENCOUNTER — Ambulatory Visit: Payer: Medicare Other | Admitting: Family Medicine

## 2018-01-07 ENCOUNTER — Other Ambulatory Visit: Payer: Self-pay

## 2018-01-07 ENCOUNTER — Encounter (HOSPITAL_COMMUNITY): Payer: Self-pay

## 2018-01-07 ENCOUNTER — Emergency Department (HOSPITAL_COMMUNITY)
Admission: EM | Admit: 2018-01-07 | Discharge: 2018-01-07 | Disposition: A | Discharge status: Home / Self Care | Payer: Medicare Other | Attending: Emergency Medicine | Admitting: Emergency Medicine

## 2018-01-07 ENCOUNTER — Emergency Department (HOSPITAL_COMMUNITY): Payer: Medicare Other

## 2018-01-07 DIAGNOSIS — Z79899 Other long term (current) drug therapy: Secondary | ICD-10-CM | POA: Diagnosis not present

## 2018-01-07 DIAGNOSIS — J45909 Unspecified asthma, uncomplicated: Secondary | ICD-10-CM | POA: Insufficient documentation

## 2018-01-07 DIAGNOSIS — I495 Sick sinus syndrome: Secondary | ICD-10-CM | POA: Diagnosis not present

## 2018-01-07 DIAGNOSIS — I2510 Atherosclerotic heart disease of native coronary artery without angina pectoris: Secondary | ICD-10-CM | POA: Insufficient documentation

## 2018-01-07 DIAGNOSIS — R001 Bradycardia, unspecified: Secondary | ICD-10-CM | POA: Diagnosis not present

## 2018-01-07 DIAGNOSIS — B20 Human immunodeficiency virus [HIV] disease: Secondary | ICD-10-CM | POA: Insufficient documentation

## 2018-01-07 DIAGNOSIS — R0602 Shortness of breath: Secondary | ICD-10-CM | POA: Insufficient documentation

## 2018-01-07 DIAGNOSIS — Z87891 Personal history of nicotine dependence: Secondary | ICD-10-CM | POA: Diagnosis not present

## 2018-01-07 DIAGNOSIS — R0781 Pleurodynia: Secondary | ICD-10-CM | POA: Diagnosis present

## 2018-01-07 DIAGNOSIS — R072 Precordial pain: Secondary | ICD-10-CM | POA: Diagnosis not present

## 2018-01-07 DIAGNOSIS — Z880 Allergy status to penicillin: Secondary | ICD-10-CM | POA: Insufficient documentation

## 2018-01-07 DIAGNOSIS — R531 Weakness: Secondary | ICD-10-CM | POA: Insufficient documentation

## 2018-01-07 DIAGNOSIS — M940 Chondrocostal junction syndrome [Tietze]: Secondary | ICD-10-CM

## 2018-01-07 DIAGNOSIS — K219 Gastro-esophageal reflux disease without esophagitis: Secondary | ICD-10-CM | POA: Diagnosis present

## 2018-01-07 DIAGNOSIS — I10 Essential (primary) hypertension: Secondary | ICD-10-CM | POA: Diagnosis not present

## 2018-01-07 DIAGNOSIS — R079 Chest pain, unspecified: Secondary | ICD-10-CM | POA: Diagnosis not present

## 2018-01-07 HISTORY — DX: Pleurodynia: R07.81

## 2018-01-07 LAB — BASIC METABOLIC PANEL
Anion gap: 9 (ref 5–15)
BUN: 23 mg/dL — ABNORMAL HIGH (ref 6–20)
CO2: 28 mmol/L (ref 22–32)
Calcium: 9.2 mg/dL (ref 8.9–10.3)
Chloride: 102 mmol/L (ref 101–111)
Creatinine, Ser: 0.83 mg/dL (ref 0.44–1.00)
GFR calc Af Amer: >60 mL/min (ref >60)
GFR calc non Af Amer: >60 mL/min (ref >60)
Glucose, Bld: 115 mg/dL — ABNORMAL HIGH (ref 65–99)
Potassium: 4.6 mmol/L (ref 3.5–5.1)
Sodium: 139 mmol/L (ref 135–145)

## 2018-01-07 LAB — I-STAT TROPONIN, ED: Troponin i, poc: 0.01 ng/mL (ref 0.00–0.08)

## 2018-01-07 LAB — CBC
HCT: 39.6 % (ref 36.0–46.0)
Hemoglobin: 12.8 g/dL (ref 12.0–15.0)
MCH: 32.8 pg (ref 26.0–34.0)
MCHC: 32.3 g/dL (ref 30.0–36.0)
MCV: 101.5 fL — ABNORMAL HIGH (ref 78.0–100.0)
Platelets: 221 10*3/uL (ref 150–400)
RBC: 3.9 MIL/uL (ref 3.87–5.11)
RDW: 13.8 % (ref 11.5–15.5)
WBC: 7.8 10*3/uL (ref 4.0–10.5)

## 2018-01-07 MED ORDER — PREDNISONE 20 MG PO TABS: 40.0000 mg | ORAL_TABLET | Freq: Every day | ORAL | 0 refills | Status: AC

## 2018-01-07 NOTE — ED Provider Notes (Signed)
Patient seen and evaluated by cardiology.  Please see cardiology consultation note for complete details.  Patient now with P waves and back to a sinus bradycardia.  No longer junctional.  Outpatient electrophysiology referral.  Troponin negative.  Stable from a cardiac standpoint per cardiology.  Outpatient cardiology follow-up.  Patient understands return to the ER for new or worsening symptoms.   Jola Schmidt, MD 01/07/18 1430

## 2018-01-07 NOTE — ED Triage Notes (Signed)
Patient complains of chest pain x 2-3 days. States that the pain is worse with movement and inspiration. No cold or cough. Patient hyperventilating on arrival

## 2018-01-07 NOTE — ED Notes (Signed)
O2 sat noted as 88 with good wave form. Pt placed on 2l/[place]. Will inform Dr. Venora Maples.

## 2018-01-07 NOTE — ED Provider Notes (Signed)
[facility] EMERGENCY DEPARTMENT Provider Note   CSN: [id number] Arrival date & time: 01/07/18  0709     History   Chief Complaint Chief Complaint  Patient presents with  . Chest Pain    HPI Nicole Mccormick is a 72 y.o. female.  HPI 72 year old female who reports intermittent sharp chest discomfort the past 2-3 days as well as increasing generalized weakness and exertional shortness of breath.  Denies significant exertional chest pain.  Patient has a known history of sick sinus syndrome and has been offered a pacemaker before in the past.  She had declined.  She presents with a junctional rhythm in the 40s.  No hypotension.  Reports some occasional lightheadedness but none now.  No syncope.  Per the medical chart she does have a history of HIV, congestive heart failure, coronary artery disease.  Pain is located in her left chest and is sharp and does not radiate.  No history of DVT or pulmonary embolism.   Past Medical History:  Diagnosis Date  . Abnormal Pap smear   . Arthritis   . Asthma   . CHF (congestive heart failure) ([place])   . Complication of anesthesia    "they have a hard time bringing me back" (11/08/2015)  . Coronary artery disease   . GERD (gastroesophageal reflux disease)    previously on aciphex, discontinued november 2012  because patient asymptomatic, and concern about interference with HIV meds.  May try pepcid in the future if symptoms return  . Heart murmur   . Hepatitis C antibody test positive   . HIV infection ([place])    diagnosed before 2008  . Hypertension   . Iron deficiency anemia    Ferritin = 2 in november 2012, started on iron supplemenation  . Seizures ([place])   . Varicosities     Patient Active Problem List   Diagnosis Date Noted  . Cough 04/12/2016  . Sick sinus syndrome ([place]) 02/28/2016  . Hypertensive cardiovascular disease 02/28/2016  . Bilateral carotid bruits 02/12/2016  . LVH (left ventricular hypertrophy) 01/15/2016    . GI bleed 11/07/2015  . Anxiety 07/22/2015  . Seasonal allergies 07/22/2015  . Asthma, chronic 07/22/2015  . Health care maintenance 02/17/2015  . Hepatitis C 08/07/2014  . Back pain 10/28/2012  . LGSIL Pap smear of vagina 01/16/2012  . Anal condylomata 11/18/2011  . CAD (coronary artery disease) 08/07/2011  . Essential hypertension, benign 12/25/2010  . Human immunodeficiency virus (HIV) disease ([place]) 01/23/2007  . TOBACCO ABUSE 01/23/2007  . CATARACT NEC 01/23/2007  . Gastroesophageal reflux disease 01/23/2007    Past Surgical History:  Procedure Laterality Date  . ABDOMINAL HYSTERECTOMY    . CARDIAC CATHETERIZATION N/A 11/08/2015   Procedure: Left Heart Cath and Coronary Angiography;  Surgeon: Jettie Booze, MD;  Location: [facility];  Service: Cardiovascular;  Laterality: N/A;  . COLONOSCOPY  10/13/11   small adenoma, anal condyloma  . COLONOSCOPY  10/13/2011   Procedure: COLONOSCOPY;  Surgeon: Gatha Mayer, MD;  Location: [facility];  Service: Endoscopy;  Laterality: N/A;  . COLONOSCOPY N/A 09/14/2014   Procedure: COLONOSCOPY;  Surgeon: Arta Silence, MD;  Location: [facility] ENDOSCOPY;  Service: Endoscopy;  Laterality: N/A;  . ESOPHAGOGASTRODUODENOSCOPY  10/13/11   small hiatal hernia  . ESOPHAGOGASTRODUODENOSCOPY  10/13/2011   Procedure: ESOPHAGOGASTRODUODENOSCOPY (EGD);  Surgeon: Gatha Mayer, MD;  Location: [facility] ENDOSCOPY;  Service: Endoscopy;  Laterality: N/A;  . ESOPHAGOGASTRODUODENOSCOPY N/A 09/14/2014   Procedure: ESOPHAGOGASTRODUODENOSCOPY (EGD);  Surgeon:  Arta Silence, MD;  Location: [facility] ENDOSCOPY;  Service: Endoscopy;  Laterality: N/A;  . GIVENS CAPSULE STUDY N/A 09/14/2014   Procedure: GIVENS CAPSULE STUDY;  Surgeon: Arta Silence, MD;  Location: [facility] ENDOSCOPY;  Service: Endoscopy;  Laterality: N/A;    OB History    Gravida Para Term Preterm AB Living   6 5     1 5    SAB TAB Ectopic Multiple Live Births   1               Home Medications     Prior to Admission medications   Medication Sig Start Date End Date Taking? Authorizing Provider  acyclovir (ZOVIRAX) 400 MG tablet Take 1 tablet (400 mg total) by mouth daily as needed (for flare ups). 09/16/14   Bartholomew Crews, MD  albuterol [facility] HFA) [phone] Base) MCG/ACT inhaler inhale 2 puffs by mouth every 4 hours if needed for wheezing or shortness of breath 12/16/17   [place] Callas, NP  albuterol (PROVENTIL) (2.5 MG/3ML) 0.083% nebulizer solution Take 3 mLs (2.5 mg total) by nebulization every 6 (six) hours as needed for wheezing or shortness of breath. 12/16/17   [place] Callas, NP  amLODipine (NORVASC) 10 MG tablet Take 1 tablet (10 mg total) by mouth daily. 12/16/17   [place] Callas, NP  atorvastatin (LIPITOR) 40 MG tablet take 1 tablet by mouth once daily AT 6 PM 12/16/17   Dixon, Melton Krebs, NP  Cetirizine HCl (ZYRTEC ALLERGY) 10 MG TBDP Take 1 tablet by mouth daily. 05/20/17   Campbell Riches, MD  chlorthalidone (HYGROTON) 25 MG tablet Take 1 tablet (25 mg total) by mouth daily. 09/15/17   Allred, Jeneen Rinks, MD  cyclobenzaprine (FLEXERIL) 5 MG tablet take 1 tablet by mouth two times a day if needed for muscle spasm 12/16/17   [place] Callas, NP  diazepam (VALIUM) 2 MG tablet take 1 tablet by mouth every 6 hours if needed for anxiety 07/21/15   Shela Leff, MD  dolutegravir (TIVICAY) 50 MG tablet Take 1 tablet (50 mg total) by mouth daily. 12/16/17   [place] Callas, NP  emtricitabine-tenofovir AF (DESCOVY) 200-25 MG tablet Take 1 tablet by mouth daily. 12/16/17   [place] Callas, NP  famotidine (PEPCID) 20 MG tablet Take 1 tablet (20 mg total) by mouth 2 (two) times daily. 11/06/16   Campbell Riches, MD  gabapentin (NEURONTIN) 600 MG tablet take 1 tablet by mouth three times a day 10/22/16   Shela Leff, MD  guaiFENesin (ROBITUSSIN) 100 MG/5ML SOLN Take 5 mLs (100 mg total) by mouth every 4 (four) hours as needed for cough or to loosen phlegm. 01/15/16    Emokpae, Ejiroghene E, MD  isosorbide mononitrate (IMDUR) 60 MG 24 hr tablet Take 1 tablet (60 mg total) by mouth daily. 12/16/17   [place] Callas, NP  NICOTINE TD Place 1 patch onto the skin daily as needed (smoking cessation).    [provider]  Nutritional Supplements (ENSURE NUTRA SHAKE HI-CAL) LIQD Take 1 Can by mouth 2 (two) times daily. Patient not taking: Reported on 12/16/2017 01/02/17   Campbell Riches, MD  ondansetron (ZOFRAN-ODT) 4 MG disintegrating tablet Take 1 tablet (4 mg total) by mouth daily as needed for nausea or vomiting. Patient not taking: Reported on 12/16/2017 05/20/17   Campbell Riches, MD  pantoprazole (PROTONIX) 40 MG tablet Take 1 tablet (40 mg total) by mouth daily. 12/16/17    Callas, NP  RA ASPIRIN  EC ADULT LOW ST 81 MG EC tablet Take 1 tablet (81 mg total) by mouth daily. Patient not taking: Reported on 12/16/2017 04/12/16   Shela Leff, MD  spironolactone (ALDACTONE) 25 MG tablet Take 1 tablet (25 mg total) by mouth daily. 12/16/17   [place] Callas, NP  triamcinolone cream (KENALOG) 0.1 % Apply 1 application topically 2 (two) times daily. 08/13/17   Ok Edwards, PA-C    Family History Family History  Problem Relation Age of Onset  . Diabetes Mother   . Ovarian cancer Sister   . Colon cancer Sister     Social History Social History   Tobacco Use  . Smoking status: Former Smoker    Packs/day: 0.75    Years: 50.00    Pack years: 37.50    Types: Cigarettes  . Smokeless tobacco: Never Used  . Tobacco comment: Wants to quit. Using both the patch and gum.  Substance Use Topics  . Alcohol use: Yes    Alcohol/week: 6.0 oz    Types: 10 Glasses of wine per week    Comment: Wine.  . Drug use: No     Allergies   Penicillins and Avelox [moxifloxacin hcl in nacl]   Review of Systems Review of Systems  All other systems reviewed and are negative.    Physical Exam Updated Vital Signs BP (!) 168/78 (BP Location: Right Arm)    Pulse 62   Temp 99.1 F (37.3 C)   Resp 20   Ht 5\' 2"  (1.575 m)   Wt 61.7 kg (136 lb)   SpO2 97%   BMI 24.87 kg/m   Physical Exam  Constitutional: She is oriented to person, place, and time. She appears well-developed and well-nourished. No distress.  HENT:  Head: Normocephalic and atraumatic.  Eyes: EOM are normal.  Neck: Normal range of motion.  Cardiovascular: Regular rhythm and normal heart sounds.  Bradycardia  Pulmonary/Chest: Effort normal and breath sounds normal.  Abdominal: Soft. She exhibits no distension. There is no tenderness.  Musculoskeletal: Normal range of motion.  Neurological: She is alert and oriented to person, place, and time.  Skin: Skin is warm and dry.  Psychiatric: She has a normal mood and affect. Judgment normal.  Nursing note and vitals reviewed.    ED Treatments / Results  Labs (all labs ordered are listed, but only abnormal results are displayed) Labs Reviewed  BASIC METABOLIC PANEL - Abnormal; Notable for the following components:      Result Value   Glucose, Bld 115 (*)    BUN 23 (*)    All other components within normal limits  CBC - Abnormal; Notable for the following components:   MCV 101.5 (*)    All other components within normal limits  I-STAT TROPONIN, ED    EKG  EKG Interpretation  Date/Time:  Wednesday January 07 2018 07:11:52 EST Ventricular Rate:  65 PR Interval:    QRS Duration: 82 QT Interval:  436 QTC Calculation: 453 R Axis:   -24 Text Interpretation:  Junctional rhythm with occasional Premature ventricular complexes Septal infarct , age undetermined Abnormal ECG changed from prior ecg Confirmed by Jola Schmidt [phone]) on 01/07/2018 7:29:37 AM       Radiology Dg Chest 2 View  Result Date: 01/07/2018 CLINICAL DATA:  Chest pain EXAM: CHEST  2 VIEW COMPARISON:  03/13/2017 chest radiograph. FINDINGS: Stable cardiomediastinal silhouette with mild cardiomegaly. No pneumothorax. No pleural effusion. No overt  pulmonary edema. No acute consolidative airspace disease. IMPRESSION: Stable  mild cardiomegaly without overt pulmonary edema. No active pulmonary disease. Electronically Signed   By: Ilona Sorrel M.D.   On: 01/07/2018 08:51    Procedures Procedures (including critical care time)  Medications Ordered in ED Medications - No data to display   Initial Impression / Assessment and Plan / ED Course  I have reviewed the triage vital signs and the nursing notes.  Pertinent labs & imaging results that were available during my care of the patient were reviewed by me and considered in my medical decision making (see chart for details).     Junctional bradycardia.  Electrolytes normal.  Patient has a known history of sick sinus syndrome.  I suspect she is symptomatic from her bradycardia.  She is agreeable to pacemaker at this time.  I will discussed the case with cardiology/electrophysiology.  She would need evaluation in the emergency department by the EP team.  No hypotension or syncope.  She does not need emergent pacer placement at this time.  She is on cardiac monitor.  Final Clinical Impressions(s) / ED Diagnoses   Final diagnoses:  None    ED Discharge Orders    None       Jola Schmidt, MD 01/07/18 [id number]

## 2018-01-07 NOTE — ED Notes (Signed)
Pt placed on purewick. Tolerates very well.

## 2018-01-07 NOTE — Consult Note (Signed)
Cardiology Consultation:   Patient ID: Shamekia Tippets; [id number]; 09-Jun-1946   Admit date: 01/07/2018 Date of Consult: 01/07/2018  Primary Care Provider: Campbell Riches, MD Primary Primary Cardiologist: Dr Meda Coffee 03/01/2016 Primary Electrophysiologist:  Dr Rayann Heman 09/15/2017   Patient Profile:   Nariyah Osias is a 72 y.o. female with a hx of CAD rx medically (cath 2016), HIV, GERD, D-CHF, AVM's, anemia, HTN, med noncompliance, who is being seen today for the evaluation of bradycardia and CP at the request of Dr Venora Maples.  History of Present Illness:   Ms. Penaloza has been followed by Dr Rayann Heman for sinus bradycardia with a heart rate in the low 50s. She has not wanted a PPM and has not been symptomatic enough to need one.   Today, Ms Yott came to the ER for sharp, intermittent chest pain, DOE and general weakness. She was in junctional rhythm, HR 40s, not hypotensive. NAD on CXR. Cards asked to see.   Pt began feeling bad 01/26, chest pain at a 3/10. It is sharp and on the L side. Pain gradually got worse. The pain was worse with deep inspiration. No change w/ position change. She vomited mucus once, pt was not eating because putting food in her mouth made her gag. She was able to drink coffee with cream, kept that down but then pain got worse. Pt did not try any rx for pain.  Today, sx became unbearable and pt came to the ER. The oxygen helps some, but the pain is no better. She can move around a little better.   She had no awareness that her HR was lower than usual. Has not taken her BP recently.   Similar sx 20 yr ago, not as bad. Went to hospital and was told she had a mild MI. Refused cath.    Past Medical History:  Diagnosis Date  . Abnormal Pap smear   . Arthritis   . Asthma   . CHF (congestive heart failure) ([place])   . Complication of anesthesia    "they have a hard time bringing me back" (11/08/2015)  . Coronary artery disease   . GERD (gastroesophageal reflux disease)    previously on aciphex, discontinued november 2012  because patient asymptomatic, and concern about interference with HIV meds.  May try pepcid in the future if symptoms return  . Heart murmur   . Hepatitis C antibody test positive   . HIV infection ([place])    diagnosed before 2008  . Hypertension   . Iron deficiency anemia    Ferritin = 2 in november 2012, started on iron supplemenation  . Pleuritic chest pain 01/07/2018  . Seizures ([place])   . Varicosities     Past Surgical History:  Procedure Laterality Date  . ABDOMINAL HYSTERECTOMY    . CARDIAC CATHETERIZATION N/A 11/08/2015   Procedure: Left Heart Cath and Coronary Angiography;  Surgeon: Jettie Booze, MD;  Location: [place] [facility];  Service: Cardiovascular;  Laterality: N/A;  . COLONOSCOPY  10/13/11   small adenoma, anal condyloma  . COLONOSCOPY  10/13/2011   Procedure: COLONOSCOPY;  Surgeon: Gatha Mayer, MD;  Location: [place];  Service: Endoscopy;  Laterality: N/A;  . COLONOSCOPY N/A 09/14/2014   Procedure: COLONOSCOPY;  Surgeon: Arta Silence, MD;  Location: [facility] ENDOSCOPY;  Service: Endoscopy;  Laterality: N/A;  . ESOPHAGOGASTRODUODENOSCOPY  10/13/11   small hiatal hernia  . ESOPHAGOGASTRODUODENOSCOPY  10/13/2011   Procedure: ESOPHAGOGASTRODUODENOSCOPY (EGD);  Surgeon: Gatha Mayer, MD;  Location: [place];  Service: Endoscopy;  Laterality: N/A;  . ESOPHAGOGASTRODUODENOSCOPY N/A 09/14/2014   Procedure: ESOPHAGOGASTRODUODENOSCOPY (EGD);  Surgeon: Arta Silence, MD;  Location: [facility] ENDOSCOPY;  Service: Endoscopy;  Laterality: N/A;  . GIVENS CAPSULE STUDY N/A 09/14/2014   Procedure: GIVENS CAPSULE STUDY;  Surgeon: Arta Silence, MD;  Location: [facility] ENDOSCOPY;  Service: Endoscopy;  Laterality: N/A;     Prior to Admission medications   Medication Sig Start Date End Date Taking? Authorizing Provider  acyclovir (ZOVIRAX) 400 MG tablet Take 1 tablet (400 mg total) by mouth daily as needed (for flare ups). 09/16/14  Yes  Bartholomew Crews, MD  albuterol [facility] HFA) [phone] Base) MCG/ACT inhaler inhale 2 puffs by mouth every 4 hours if needed for wheezing or shortness of breath 12/16/17  Yes [place] Callas, NP  albuterol (PROVENTIL) (2.5 MG/3ML) 0.083% nebulizer solution Take 3 mLs (2.5 mg total) by nebulization every 6 (six) hours as needed for wheezing or shortness of breath. 12/16/17  Yes [place] Callas, NP  amLODipine (NORVASC) 10 MG tablet Take 1 tablet (10 mg total) by mouth daily. 12/16/17  Yes [place] Callas, NP  Ascorbic Acid (VITAMIN C) 1000 MG tablet Take 1,000 mg by mouth daily.   Yes [provider]  atorvastatin (LIPITOR) 40 MG tablet take 1 tablet by mouth once daily AT 6 PM 12/16/17  Yes [place] Callas, NP  Cetirizine HCl (ZYRTEC ALLERGY) 10 MG TBDP Take 1 tablet by mouth daily. 05/20/17  Yes Campbell Riches, MD  chlorthalidone (HYGROTON) 25 MG tablet Take 1 tablet (25 mg total) by mouth daily. 09/15/17  Yes Allred, Jeneen Rinks, MD  cholecalciferol (VITAMIN D) 1000 units tablet Take 1,000 Units by mouth daily.   Yes [provider]  cyclobenzaprine (FLEXERIL) 5 MG tablet take 1 tablet by mouth two times a day if needed for muscle spasm 12/16/17  Yes Theodosia Callas, NP  diazepam (VALIUM) 2 MG tablet take 1 tablet by mouth every 6 hours if needed for anxiety 07/21/15  Yes Shela Leff, MD  dolutegravir (TIVICAY) 50 MG tablet Take 1 tablet (50 mg total) by mouth daily. 12/16/17  Yes [place] Callas, NP  emtricitabine-tenofovir AF (DESCOVY) 200-25 MG tablet Take 1 tablet by mouth daily. 12/16/17  Yes [place] Callas, NP  famotidine (PEPCID) 20 MG tablet Take 1 tablet (20 mg total) by mouth 2 (two) times daily. 11/06/16  Yes Campbell Riches, MD  ferrous sulfate 325 (65 FE) MG EC tablet Take 325 mg by mouth daily.   Yes [provider]  Flaxseed, Linseed, (FLAX SEED OIL) 1000 MG CAPS Take 1,000 mg by mouth daily.   Yes [provider]  gabapentin  (NEURONTIN) 600 MG tablet take 1 tablet by mouth three times a day 10/22/16  Yes Shela Leff, MD  guaiFENesin (ROBITUSSIN) 100 MG/5ML SOLN Take 5 mLs (100 mg total) by mouth every 4 (four) hours as needed for cough or to loosen phlegm. 01/15/16  Yes Emokpae, Ejiroghene E, MD  hydroxypropyl methylcellulose / hypromellose (ISOPTO TEARS / GONIOVISC) 2.5 % ophthalmic solution Place 1 drop into both eyes as needed for dry eyes.   Yes [provider]  isosorbide mononitrate (IMDUR) 60 MG 24 hr tablet Take 1 tablet (60 mg total) by mouth daily. 12/16/17  Yes  Callas, NP  Omega-3 Fatty Acids (FISH OIL) 1000 MG CPDR Take 1,000 mg by mouth daily.   Yes [provider]  ondansetron (ZOFRAN-ODT) 4 MG disintegrating tablet Take 1 tablet (4  mg total) by mouth daily as needed for nausea or vomiting. 05/20/17  Yes Campbell Riches, MD  pantoprazole (PROTONIX) 40 MG tablet Take 1 tablet (40 mg total) by mouth daily. 12/16/17  Yes Dixon, Melton Krebs, NP  RA ASPIRIN EC ADULT LOW ST 81 MG EC tablet Take 1 tablet (81 mg total) by mouth daily. 04/12/16  Yes Shela Leff, MD  spironolactone (ALDACTONE) 25 MG tablet Take 1 tablet (25 mg total) by mouth daily. 12/16/17  Yes [place] Callas, NP  triamcinolone cream (KENALOG) 0.1 % Apply 1 application topically 2 (two) times daily. 08/13/17  Yes Yu, Amy V, PA-C  vitamin A 10000 UNIT capsule Take 10,000 Units by mouth daily.   Yes [provider]  vitamin B-12 (CYANOCOBALAMIN) 1000 MCG tablet Take 1,000 mcg by mouth daily.   Yes [provider]  NICOTINE TD Place 1 patch onto the skin daily as needed (smoking cessation).    [provider]  Nutritional Supplements (ENSURE NUTRA SHAKE HI-CAL) LIQD Take 1 Can by mouth 2 (two) times daily. Patient not taking: Reported on 12/16/2017 01/02/17   Campbell Riches, MD    Inpatient Medications: None  Allergies:    Allergies  Allergen Reactions  . Penicillins      REACTION: GOES INTO SHOCK & THEN PASSES OU Has patient had a PCN reaction causing immediate rash, facial/tongue/throat swelling, SOB or lightheadedness with hypotension: NO Has patient had a PCN reaction causing severe rash involving mucus membranes or skin necrosis: NO Has patient had a PCN reaction that required hospitalization NO Has patient had a PCN reaction occurring within the last 10 years: NO If all of the above answers are "NO", then may proceed with Cephalosporin use.   . Avelox [Moxifloxacin Hcl In Nacl] Itching and Rash    Social History:   Social History   Socioeconomic History  . Marital status: Married    Spouse name: Not on file  . Number of children: Not on file  . Years of education: Not on file  . Highest education level: Not on file  Social Needs  . Financial resource strain: Not on file  . Food insecurity - worry: Not on file  . Food insecurity - inability: Not on file  . Transportation needs - medical: Not on file  . Transportation needs - non-medical: Not on file  Occupational History  . Not on file  Tobacco Use  . Smoking status: Former Smoker    Packs/day: 0.75    Years: 50.00    Pack years: 37.50    Types: Cigarettes  . Smokeless tobacco: Never Used  . Tobacco comment: Wants to quit. Using both the patch and gum.  Substance and Sexual Activity  . Alcohol use: Yes    Alcohol/week: 6.0 oz    Types: 10 Glasses of wine per week    Comment: Wine.  . Drug use: No  . Sexual activity: Not on file    Comment: pt. given condoms  Other Topics Concern  . Not on file  Social History Narrative  . Not on file    Family History:   Family History  Problem Relation Age of Onset  . Diabetes Mother   . Ovarian cancer Sister   . Colon cancer Sister    Family Status:  Family Status  Relation Name Status  . Mother  Alive  . Father  Deceased  . Sister  (Not Specified)  . Sister  (Not Specified)    ROS:  Please see the history of present illness.    All other ROS reviewed and negative.     Physical Exam/Data:   Vitals:   01/07/18 1130 01/07/18 1145 01/07/18 1200 01/07/18 1215  BP: (!) 169/82 (!) 146/76 (!) 162/75 (!) 190/73  Pulse: (!) 46 (!) 48 (!) 45 (!) 38  Resp: (!) 28 (!) 32 (!) 23 (!) 30  Temp:      SpO2: 96% 94% 96% 96%  Weight:      Height:       No intake or output data in the 24 hours ending 01/07/18 1358 Filed Weights   01/07/18 0715  Weight: 136 lb (61.7 kg)   Body mass index is 24.87 kg/m.  General:  Well nourished, well developed, in moderate pain HEENT: normal Lymph: no adenopathy Neck: minimal JVD Endocrine:  No thryomegaly Vascular: No carotid bruits; 4/4 extremity pulses 2+, without bruits  Cardiac:  normal S1, S2; RRR; 2/6 murmur  Lungs:  Decreased BS bases bilaterally, no wheezing or rhonchi, few rales, exquisite chest wall tenderness lateral L chest in the mid-axillary line. Abd: soft, nontender, no hepatomegaly  Ext: no edema Musculoskeletal:  No deformities, BUE and BLE strength normal and equal Skin: warm and dry  Neuro:  CNs 2-12 intact, no focal abnormalities noted Psych:  Normal affect   EKG:  The EKG was personally reviewed and demonstrates:  01/30, junctional rhythm w/ HR 47, interpolated beats increases HR to 65, +LVH Telemetry:  Telemetry was personally reviewed and demonstrates:  Junctional rhythm w/ HR 40s and high 30s at times, converted to SR and HR now 60s  Relevant CV Studies:  ECHO: 11/10/2015 - Left ventricle: The cavity size was normal. There was moderate   concentric hypertrophy. Systolic function was hyperdynamic. The   estimated ejection fraction was in the range of 70% to 75%. There   was dynamic obstruction at restin the mid cavity, with a peak   velocity of 240 cm/sec and a peak gradient of 23 mm Hg. Wall   motion was normal; there were no regional wall motion   abnormalities. Doppler parameters are consistent with abnormal   left ventricular relaxation (grade 1  diastolic dysfunction).   Doppler parameters are consistent with elevated mean left atrial   filling pressure. - Mitral valve: Calcified annulus. There was mild regurgitation. - Left atrium: The atrium was moderately dilated. - Pulmonary arteries: PA peak pressure: 32 mm Hg (S). Impressions: - No change from April 2015 study.  CATH: 11/08/2015  Prox RCA to Mid RCA lesion, 80% stenosed.  Mid RCA lesion, 75% stenosed.  Mid Cx lesion, 50% stenosed.  Mid LAD lesion, 10% stenosed.  There is moderate left ventricular systolic dysfunction with distal inferior and apical hypokinesis.  RPDA-severely diseased, 95% stenosed.  Complex, calcific RCA disease.  The distal PDA is small and diffusely diseased.  Her circumflex is codominant.  I think medical therapy is the best treatment at this time given her issues with GI bleeding.  Rotational atherectomy would be needed and high risk given the tortuousity.    Laboratory Data:  Chemistry Recent Labs  Lab 01/07/18 0713  NA 139  K 4.6  CL 102  CO2 28  GLUCOSE 115*  BUN 23*  CREATININE 0.83  CALCIUM 9.2  GFRNONAA >60  GFRAA >60  ANIONGAP 9    Lab Results  Component Value Date   ALT 8 11/10/2017   AST 15 11/10/2017   ALKPHOS 64 05/15/2017   BILITOT 0.4 11/10/2017  Hematology Recent Labs  Lab 01/07/18 0713  WBC 7.8  RBC 3.90  HGB 12.8  HCT 39.6  MCV 101.5*  MCH 32.8  MCHC 32.3  RDW 13.8  PLT 221   Cardiac EnzymesNo results for input(s): TROPONINI in the last 168 hours.  Recent Labs  Lab 01/07/18 0723  TROPIPOC 0.01    BNPNo results for input(s): BNP, PROBNP in the last 168 hours.   TSH:  Lab Results  Component Value Date   TSH 0.81 03/01/2016   Lipids: Lab Results  Component Value Date   CHOL 111 11/10/2017   HDL 59 11/10/2017   LDLCALC 24 05/15/2017   TRIG 89 11/10/2017   CHOLHDL 1.9 11/10/2017    Radiology/Studies:  Dg Chest 2 View  Result Date: 01/07/2018 CLINICAL DATA:  Chest pain EXAM:  CHEST  2 VIEW COMPARISON:  03/13/2017 chest radiograph. FINDINGS: Stable cardiomediastinal silhouette with mild cardiomegaly. No pneumothorax. No pleural effusion. No overt pulmonary edema. No acute consolidative airspace disease. IMPRESSION: Stable mild cardiomegaly without overt pulmonary edema. No active pulmonary disease. Electronically Signed   By: Ilona Sorrel M.D.   On: 01/07/2018 08:51    Assessment and Plan:   Principal Problem: 1.  Pleuritic chest pain - sx have been continuous for > 48 hr without ez elevation or ischemic ECG changes - reluctant to use NSAIDs w/ hx AVMs - MD advise on colchicine or steroids, Voltarne gel may help sx  Active Problems: 2.  Sick sinus syndrome (H[facility]) - pt has long hx sinus brady - Holter monitor 02/2016 w/ HR 30s at times and junctional beats seen - pt having a great deal of CP, unclear what sx were from the bradycardia (if any) - f/u with Dr Rayann Heman, she is willing to consider a PPM.  3.  Gastroesophageal reflux disease - if CP not felt MS, consider GI cocktail  4.  Essential hypertension, benign - Pt had problems in the past w/ getting PCP, was out of her BP meds for a long time - Had PCP appt 01/25, hopefully will do better now.   For questions or updates, please contact [place] Please consult [email or web address] for contact info under Cardiology/STEMI.   Signed, Rosaria Ferries, PA-C  01/07/2018 1:58 PM  I have seen and examined the patient along with Rosaria Ferries, PA-C .  I have reviewed the chart, notes and new data.  I agree with PA's note.  Key new complaints: Her chest pain is clearly musculoskeletal.  It increases when she takes a deep breath but is also exquisitely tender to palpation over both the left and right costochondral joints.  It is consistent with costochondritis and not with a cardiac or pulmonary etiology.  Independently, she describes a long standing problems with fatigue and dyspnea, consistent with sinus node  dysfunction/chronotropic incompetence, which is well documented. Key examination changes: Exquisite tenderness to palpation over the costochondral joints.  Regular rate and rhythm, grade 2/6 early peaking systolic ejection murmur heard at both the upper right and left sternal borders, no diastolic murmurs, no gallop no edema. Key new findings / data: ECG on presentation showed junctional escape rhythm with PVCs, but on telemetry she is currently back in sinus rhythm in the low 60s.  PLAN: - Acute presentation is due to costochondritis and will improve with anti-inflammatory medications.  Consider use of corticosteroids due to history of GI bleeding from AVMs. - Her fatigue and dyspnea are chronic complaints. They are related to well-established sinus node dysfunction.  She has  declined pacemaker therapy in the past, but seems more predisposed to consider it at this time.  We will make sure that she gets a follow-up appointment with her electrophysiologist to arrange outpatient pacemaker implantation.  She is hemodynamically stable, has not had syncope or any threatening heart rhythms.  She can be managed safely as an outpatient in my opinion.  Sanda Klein, MD, [place] [phone] 01/07/2018, 1:58 PM

## 2018-01-07 NOTE — ED Notes (Signed)
Attempted iv x 1 with no success, pt did not tolerate well.

## 2018-01-07 NOTE — ED Notes (Signed)
Pt states she understands instructions. Home stable with family\. 

## 2018-01-07 NOTE — ED Notes (Signed)
Patient transported to X-ray 

## 2018-01-08 ENCOUNTER — Ambulatory Visit: Payer: Medicare Other | Admitting: Family Medicine

## 2018-01-20 ENCOUNTER — Ambulatory Visit (INDEPENDENT_AMBULATORY_CARE_PROVIDER_SITE_OTHER): Payer: Medicare Other | Admitting: Cardiology

## 2018-01-20 ENCOUNTER — Encounter: Payer: Self-pay | Admitting: Cardiology

## 2018-01-20 VITALS — BP 208/76 | HR 47 | Ht 62.0 in | Wt 136.0 lb

## 2018-01-20 DIAGNOSIS — R011 Cardiac murmur, unspecified: Secondary | ICD-10-CM

## 2018-01-20 DIAGNOSIS — R001 Bradycardia, unspecified: Secondary | ICD-10-CM | POA: Diagnosis not present

## 2018-01-20 DIAGNOSIS — I119 Hypertensive heart disease without heart failure: Secondary | ICD-10-CM | POA: Diagnosis not present

## 2018-01-20 DIAGNOSIS — Z720 Tobacco use: Secondary | ICD-10-CM | POA: Diagnosis not present

## 2018-01-20 DIAGNOSIS — I10 Essential (primary) hypertension: Secondary | ICD-10-CM | POA: Diagnosis not present

## 2018-01-20 DIAGNOSIS — I495 Sick sinus syndrome: Secondary | ICD-10-CM

## 2018-01-20 MED ORDER — LISINOPRIL 20 MG PO TABS: 20.0000 mg | ORAL_TABLET | Freq: Every day | ORAL | 3 refills | Status: DC

## 2018-01-20 NOTE — Patient Instructions (Signed)
Medication Instructions:   START TAKING LISINOPRIL 20 MG ONCE DAILY    Testing/Procedures:  Your physician has requested that you have an echocardiogram. Echocardiography is a painless test that uses sound waves to create images of your heart. It provides your doctor with information about the size and shape of your heart and how well your heart's chambers and valves are working. This procedure takes approximately one hour. There are no restrictions for this procedure.    Follow-Up:  2 WEEKS WITH DR. Meda Coffee ON 01/30/18 AT HER 12 PM SLOT    A NURSE FROM THE OFFICE WILL CALL YOU BACK ABOUT SCHEDULING OF YOUR PACEMAKER PLACEMENT BY DR. Rayann Heman       If you need a refill on your cardiac medications before your next appointment, please call your pharmacy.

## 2018-01-20 NOTE — Progress Notes (Signed)
Patient ID: Nicole Mccormick, female   DOB: 27-Nov-1946, 72 y.o.   MRN: [id number]    CARDIOLOGY OFFICE NOTE  Date:  01/25/2018   Nicole Mccormick Date of Birth: Nov 11, 1946 Medical Record #[id number]  PCP:  Campbell Riches, MD  Cardiologist:  Meda Coffee    Chief Complaint  Patient presents with  . Chest Pain  . Irregular Heart Beat   History of Present Illness: Nicole Mccormick is a 72 y.o. female who presents today for a post hospital visit. Seen for Dr. Meda Coffee.   She has a past medical history significant for HIV, GERD, diastolic heart failure, AVM's with chronic anemia, and hypertension. She also has chronic bradycardia that has been asymptomatic for the most part.   She presented to the hospital earlier this month with increased fatigue and shortness of breath on exertion. Catheterization demonstrated complex calcific RCA disease, distal PDA with diffuse disease. Medical therapy was recommended. EP saw as well for bradycardia - recommended GI workup and correcting anemia and if symptoms of lightheadedness and fatigue persist despite normal CBC, then consider PPM implant.   03/01/2016  - 1 months follow-up, in the meantime patient saw Dr. Rayann Heman for evaluation of profound symptomatic bradycardia and he suggested a placement of pacemaker however she wanted to think about it' timing is to have a great baby coming. In the meantime the patient continues feeling fatigued she denies any palpitations or syncope. No significant dizziness. She denies any chest pain she has stable dyspnea on exertion, she is tolerating pravastatin well with no muscle pain or joint pain. She hasn't noticed any bleeding in her most recent hemoglobin in February was 10.5.  01/20/2018 - 2 year follow  Up, the patient went to the ER with headaches, dizziness, fatigue, she was found to be hypertensive and in junctional rhythm. Today for the first time she admits that she is opened to an idea of a PM placement. Denies any syncope, no  orthopnea, PND, no LE edema, no recent chest pain. Just significant fatigue and headaches.   Past Medical History:  Diagnosis Date  . Abnormal Pap smear   . Arthritis   . Asthma   . CHF (congestive heart failure) ([place])   . Complication of anesthesia    "they have a hard time bringing me back" (11/08/2015)  . Coronary artery disease   . GERD (gastroesophageal reflux disease)    previously on aciphex, discontinued november 2012  because patient asymptomatic, and concern about interference with HIV meds.  May try pepcid in the future if symptoms return  . Heart murmur   . Hepatitis C antibody test positive   . HIV infection ([place])    diagnosed before 2008  . Hypertension   . Iron deficiency anemia    Ferritin = 2 in november 2012, started on iron supplemenation  . Pleuritic chest pain 01/07/2018  . Seizures ([place])   . Varicosities     Past Surgical History:  Procedure Laterality Date  . ABDOMINAL HYSTERECTOMY    . CARDIAC CATHETERIZATION N/A 11/08/2015   Procedure: Left Heart Cath and Coronary Angiography;  Surgeon: Jettie Booze, MD;  Location: [facility];  Service: Cardiovascular;  Laterality: N/A;  . COLONOSCOPY  10/13/11   small adenoma, anal condyloma  . COLONOSCOPY  10/13/2011   Procedure: COLONOSCOPY;  Surgeon: Gatha Mayer, MD;  Location: [facility];  Service: Endoscopy;  Laterality: N/A;  . COLONOSCOPY N/A 09/14/2014   Procedure: COLONOSCOPY;  Surgeon: Arta Silence, MD;  Location[facility]  ENDOSCOPY;  Service: Endoscopy;  Laterality: N/A;  . ESOPHAGOGASTRODUODENOSCOPY  10/13/11   small hiatal hernia  . ESOPHAGOGASTRODUODENOSCOPY  10/13/2011   Procedure: ESOPHAGOGASTRODUODENOSCOPY (EGD);  Surgeon: Gatha Mayer, MD;  Location: [facility] ENDOSCOPY;  Service: Endoscopy;  Laterality: N/A;  . ESOPHAGOGASTRODUODENOSCOPY N/A 09/14/2014   Procedure: ESOPHAGOGASTRODUODENOSCOPY (EGD);  Surgeon: Arta Silence, MD;  Location: [facility] ENDOSCOPY;  Service: Endoscopy;  Laterality: N/A;    . GIVENS CAPSULE STUDY N/A 09/14/2014   Procedure: GIVENS CAPSULE STUDY;  Surgeon: Arta Silence, MD;  Location: [facility] ENDOSCOPY;  Service: Endoscopy;  Laterality: N/A;     Medications: Current Outpatient Medications  Medication Sig Dispense Refill  . acyclovir (ZOVIRAX) 400 MG tablet Take 1 tablet (400 mg total) by mouth daily as needed (for flare ups). 30 tablet 3  . albuterol (PROAIR HFA) 108 (90 Base) MCG/ACT inhaler inhale 2 puffs by mouth every 4 hours if needed for wheezing or shortness of breath 8.5 g 2  . albuterol (PROVENTIL) (2.5 MG/3ML) 0.083% nebulizer solution Take 3 mLs (2.5 mg total) by nebulization every 6 (six) hours as needed for wheezing or shortness of breath. 75 mL 3  . amLODipine (NORVASC) 10 MG tablet Take 1 tablet (10 mg total) by mouth daily. 90 tablet 0  . Ascorbic Acid (VITAMIN C) 1000 MG tablet Take 1,000 mg by mouth daily.    Marland Kitchen atorvastatin (LIPITOR) 40 MG tablet take 1 tablet by mouth once daily AT 6 PM 90 tablet 0  . Cetirizine HCl (ZYRTEC ALLERGY) 10 MG TBDP Take 1 tablet by mouth daily. 90 tablet 3  . chlorthalidone (HYGROTON) 25 MG tablet Take 1 tablet (25 mg total) by mouth daily. 90 tablet 3  . cholecalciferol (VITAMIN D) 1000 units tablet Take 1,000 Units by mouth daily.    . cyclobenzaprine (FLEXERIL) 5 MG tablet take 1 tablet by mouth two times a day if needed for muscle spasm 30 tablet 0  . diazepam (VALIUM) 2 MG tablet take 1 tablet by mouth every 6 hours if needed for anxiety 30 tablet 0  . dolutegravir (TIVICAY) 50 MG tablet Take 1 tablet (50 mg total) by mouth daily. 30 tablet 11  . emtricitabine-tenofovir AF (DESCOVY) 200-25 MG tablet Take 1 tablet by mouth daily. 90 tablet 3  . famotidine (PEPCID) 20 MG tablet Take 1 tablet (20 mg total) by mouth 2 (two) times daily. 60 tablet 3  . ferrous sulfate 325 (65 FE) MG EC tablet Take 325 mg by mouth daily.    . Flaxseed, Linseed, (FLAX SEED OIL) 1000 MG CAPS Take 1,000 mg by mouth daily.    Marland Kitchen gabapentin  (NEURONTIN) 600 MG tablet take 1 tablet by mouth three times a day 90 tablet 0  . guaiFENesin (ROBITUSSIN) 100 MG/5ML SOLN Take 5 mLs (100 mg total) by mouth every 4 (four) hours as needed for cough or to loosen phlegm. 1200 mL 0  . hydroxypropyl methylcellulose / hypromellose (ISOPTO TEARS / GONIOVISC) 2.5 % ophthalmic solution Place 1 drop into both eyes as needed for dry eyes.    . isosorbide mononitrate (IMDUR) 60 MG 24 hr tablet Take 1 tablet (60 mg total) by mouth daily. 90 tablet 0  . Nutritional Supplements (ENSURE NUTRA SHAKE HI-CAL) LIQD Take 1 Can by mouth 2 (two) times daily. 90 Can 11  . Omega-3 Fatty Acids (FISH OIL) 1000 MG CPDR Take 1,000 mg by mouth daily.    . ondansetron (ZOFRAN-ODT) 4 MG disintegrating tablet Take 1 tablet (4 mg total) by mouth daily  as needed for nausea or vomiting. 20 tablet 3  . pantoprazole (PROTONIX) 40 MG tablet Take 1 tablet (40 mg total) by mouth daily. 90 tablet 0  . RA ASPIRIN EC ADULT LOW ST 81 MG EC tablet Take 1 tablet (81 mg total) by mouth daily. 30 tablet 5  . spironolactone (ALDACTONE) 25 MG tablet Take 1 tablet (25 mg total) by mouth daily. 90 tablet 0  . triamcinolone cream (KENALOG) 0.1 % Apply 1 application topically 2 (two) times daily. 30 g 0  . vitamin A 10000 UNIT capsule Take 10,000 Units by mouth daily.    . vitamin B-12 (CYANOCOBALAMIN) 1000 MCG tablet Take 1,000 mcg by mouth daily.    Marland Kitchen lisinopril (PRINIVIL,ZESTRIL) 20 MG tablet Take 1 tablet (20 mg total) by mouth daily. 90 tablet 3   No current facility-administered medications for this visit.     Allergies: Allergies  Allergen Reactions  . Penicillins     REACTION: GOES INTO SHOCK & THEN PASSES OU Has patient had a PCN reaction causing immediate rash, facial/tongue/throat swelling, SOB or lightheadedness with hypotension: NO Has patient had a PCN reaction causing severe rash involving mucus membranes or skin necrosis: NO Has patient had a PCN reaction that required  hospitalization NO Has patient had a PCN reaction occurring within the last 10 years: NO If all of the above answers are "NO", then may proceed with Cephalosporin use.   . Avelox [Moxifloxacin Hcl In Nacl] Itching and Rash    Social History: The patient  reports that she has been smoking cigarettes.  She has a 37.50 pack-year smoking history. she has never used smokeless tobacco. She reports that she drinks about 6.0 oz of alcohol per week. She reports that she does not use drugs.   Family History: The patient's family history includes Colon cancer in her sister; Diabetes in her mother; Ovarian cancer in her sister.   Review of Systems: Please see the history of present illness.   Otherwise, the review of systems is positive for cramps in the right leg - has with movement .   All other systems are reviewed and negative.   Physical Exam: VS:  BP (!) 208/76 (BP Location: Left Arm, Patient Position: Sitting, Cuff Size: Normal)   Pulse (!) 47   Ht 5\' 2"  (1.575 m)   Wt 136 lb (61.7 kg)   SpO2 95%   BMI 24.87 kg/m  .  BMI Body mass index is 24.87 kg/m.  Wt Readings from Last 3 Encounters:  01/20/18 136 lb (61.7 kg)  01/07/18 136 lb (61.7 kg)  12/16/17 136 lb (61.7 kg)   BP 136/68 mmHg, HR 48 BPM General: Pleasant. Well developed, well nourished and in no acute distress. She smells of tobacco.  HEENT: Normal.  Neck: Supple, no JVD, carotid bruits, or masses noted.  Cardiac: Regular rate and rhythm. Outflow murmur noted. No edema.  Respiratory:  Lungs are clear to auscultation bilaterally with normal work of breathing.  GI: Soft and nontender.  MS: No deformity or atrophy. Gait and ROM intact.  Skin: Warm and dry. Color is normal.  Neuro:  Strength and sensation are intact and no gross focal deficits noted.  Psych: Alert, appropriate and with normal affect.  LABORATORY DATA:  EKG:  EKG is not ordered today.  Lab Results  Component Value Date   WBC 7.8 01/07/2018   HGB 12.8  01/07/2018   HCT 39.6 01/07/2018   PLT 221 01/07/2018   GLUCOSE 115 (H) 01/07/2018  CHOL 111 11/10/2017   TRIG 89 11/10/2017   HDL 59 11/10/2017   LDLCALC 24 05/15/2017   ALT 8 11/10/2017   AST 15 11/10/2017   NA 139 01/07/2018   K 4.6 01/07/2018   CL 102 01/07/2018   CREATININE 0.83 01/07/2018   BUN 23 (H) 01/07/2018   CO2 28 01/07/2018   TSH 0.81 03/01/2016   INR 1.16 09/12/2014   Other Studies Reviewed Today: Cardiac Cath Conclusion from 11/08/2015     Prox RCA to Mid RCA lesion, 80% stenosed.  Mid RCA lesion, 75% stenosed.  Mid Cx lesion, 50% stenosed.  Mid LAD lesion, 10% stenosed.  There is moderate left ventricular systolic dysfunction with distal inferior and apical hypokinesis.  RPDA-severely diseased, 95% stenosed.  Complex, calcific RCA disease. The distal PDA is small and diffusely diseased. Her circumflex is codominant. I think medical therapy is the best treatemnt at this time given her issues with GI bleeding. Rotational atherectomy would be needed and high risk given the tortuousity.   Further cardiac plan per Dr. Meda Coffee and the inpatient team.   Echo Study Conclusions from 11/2015  - Left ventricle: The cavity size was normal. There was moderate concentric hypertrophy. Systolic function was hyperdynamic. The estimated ejection fraction was in the range of 70% to 75%. There was dynamic obstruction at restin the mid cavity, with a peak velocity of 240 cm/sec and a peak gradient of 23 mm Hg. Wall motion was normal; there were no regional wall motion abnormalities. Doppler parameters are consistent with abnormal left ventricular relaxation (grade 1 diastolic dysfunction). Doppler parameters are consistent with elevated mean left atrial filling pressure. - Mitral valve: Calcified annulus. There was mild regurgitation. - Left atrium: The atrium was moderately dilated. - Pulmonary arteries: PA peak pressure: 32 mm Hg  (S).  Impressions: - No change from April 2015 study.  ECG: SB with sinus arrhythmia, LAE, LVH, previously escape junctional rhythm with PVCs.   Assessment/Plan:  1. Hypertensive urgency - I will add Lisinopril 20 mg po daily and follow up in 2 weeks. Obtain echocardiogram.  2. Symptomatic bradycardia - previously HR down to 30' during awake hours, ECGs showing escape junctional rhythm with PVCs, recommended PM placement but refused. Now more symptomatic and agrees to a PM placement, we will arrange for an appointment with Dr Rayann Heman.  3. CAD - no recent chest pain, continue asa 81 mg po daily and atorvastatin 40 mg po daily  4. Tobacco abuse - cessation discussed  5. Anemia/GI bleed - followed by [facility] - she is now stable on baby aspirin with stable hemoglobin 12.8 in 12/2017.   6. Hyperlipidemia on atorvastatin 40 mg daily that is well tolerated, we'll schedule lipids and CMP today.  Follow-up in 2 weeks.  Signed: Ena Dawley, MD  01/25/2018 9:12 PM  [facility] [address] [place], [place]  [address] Phone: [phone] Fax: [phone]

## 2018-01-22 ENCOUNTER — Telehealth: Payer: Self-pay | Admitting: *Deleted

## 2018-01-22 NOTE — Telephone Encounter (Signed)
Informed pt that Dr. Rayann Heman would like to see her in the office prior to scheduling PPM implant. Transferred call for scheduler, Melissa, to arrange OV.

## 2018-01-23 ENCOUNTER — Other Ambulatory Visit: Payer: Self-pay

## 2018-01-23 ENCOUNTER — Ambulatory Visit (HOSPITAL_COMMUNITY): Payer: Medicare Other | Attending: Cardiology

## 2018-01-23 DIAGNOSIS — B1920 Unspecified viral hepatitis C without hepatic coma: Secondary | ICD-10-CM | POA: Diagnosis not present

## 2018-01-23 DIAGNOSIS — J45909 Unspecified asthma, uncomplicated: Secondary | ICD-10-CM | POA: Diagnosis not present

## 2018-01-23 DIAGNOSIS — R011 Cardiac murmur, unspecified: Secondary | ICD-10-CM | POA: Diagnosis not present

## 2018-01-23 DIAGNOSIS — R569 Unspecified convulsions: Secondary | ICD-10-CM | POA: Diagnosis not present

## 2018-01-23 DIAGNOSIS — I110 Hypertensive heart disease with heart failure: Secondary | ICD-10-CM | POA: Diagnosis not present

## 2018-01-23 DIAGNOSIS — I509 Heart failure, unspecified: Secondary | ICD-10-CM | POA: Insufficient documentation

## 2018-01-23 DIAGNOSIS — R001 Bradycardia, unspecified: Secondary | ICD-10-CM

## 2018-01-23 DIAGNOSIS — I2510 Atherosclerotic heart disease of native coronary artery without angina pectoris: Secondary | ICD-10-CM | POA: Diagnosis not present

## 2018-01-23 DIAGNOSIS — I10 Essential (primary) hypertension: Secondary | ICD-10-CM | POA: Diagnosis not present

## 2018-01-23 DIAGNOSIS — I495 Sick sinus syndrome: Secondary | ICD-10-CM | POA: Diagnosis not present

## 2018-01-23 DIAGNOSIS — B20 Human immunodeficiency virus [HIV] disease: Secondary | ICD-10-CM | POA: Insufficient documentation

## 2018-01-28 ENCOUNTER — Encounter: Payer: Self-pay | Admitting: Cardiology

## 2018-01-28 ENCOUNTER — Ambulatory Visit (INDEPENDENT_AMBULATORY_CARE_PROVIDER_SITE_OTHER): Payer: Medicare Other | Admitting: Cardiology

## 2018-01-28 VITALS — BP 130/72 | HR 59 | Ht 62.0 in | Wt 138.0 lb

## 2018-01-28 DIAGNOSIS — I119 Hypertensive heart disease without heart failure: Secondary | ICD-10-CM

## 2018-01-28 DIAGNOSIS — E7800 Pure hypercholesterolemia, unspecified: Secondary | ICD-10-CM

## 2018-01-28 DIAGNOSIS — Z720 Tobacco use: Secondary | ICD-10-CM

## 2018-01-28 DIAGNOSIS — R001 Bradycardia, unspecified: Secondary | ICD-10-CM

## 2018-01-28 MED ORDER — LISINOPRIL 2.5 MG PO TABS: 2.5000 mg | ORAL_TABLET | Freq: Every day | ORAL | 3 refills | Status: DC

## 2018-01-28 NOTE — Progress Notes (Signed)
Patient ID: Nicole Mccormick, female   DOB: 06-30-1946, 72 y.o.   MRN: [id number]    CARDIOLOGY OFFICE NOTE  Date:  01/28/2018   [name] Date of Birth: December 03, 1946 Medical Record #[id number]  PCP:  Campbell Riches, MD  Cardiologist:  Nancy Fetter chief complaint on file.  History of Present Illness: Nicole Mccormick is a 72 y.o. female who presents today for a post hospital visit. Seen for Dr. Meda Coffee.   She has a past medical history significant for HIV, GERD, diastolic heart failure, AVM's with chronic anemia, and hypertension. She also has chronic bradycardia that has been asymptomatic for the most part.   She presented to the hospital earlier this month with increased fatigue and shortness of breath on exertion. Catheterization demonstrated complex calcific RCA disease, distal PDA with diffuse disease. Medical therapy was recommended. EP saw as well for bradycardia - recommended GI workup and correcting anemia and if symptoms of lightheadedness and fatigue persist despite normal CBC, then consider PPM implant.   03/01/2016  - 1 months follow-up, in the meantime patient saw Dr. Rayann Heman for evaluation of profound symptomatic bradycardia and he suggested a placement of pacemaker however she wanted to think about it' timing is to have a great baby coming. In the meantime the patient continues feeling fatigued she denies any palpitations or syncope. No significant dizziness. She denies any chest pain she has stable dyspnea on exertion, she is tolerating pravastatin well with no muscle pain or joint pain. She hasn't noticed any bleeding in her most recent hemoglobin in February was 10.5.  01/20/2018 - 2 year follow  Up, the patient went to the ER with headaches, dizziness, fatigue, she was found to be hypertensive and in junctional rhythm. Today for the first time she admits that she is opened to an idea of a PM placement. Denies any syncope, no orthopnea, PND, no LE edema, no recent chest pain. Just  significant fatigue and headaches.  01/28/18 - 1 week follow up, the patient took one pill of lisinopril with BP drop from 200 to 20 and it remained that way ever since. She continues to feel SOB with minimal exertion and ocassionally dizzy, no falls.    Past Medical History:  Diagnosis Date  . Abnormal Pap smear   . Arthritis   . Asthma   . CHF (congestive heart failure) ([place])   . Complication of anesthesia    "they have a hard time bringing me back" (11/08/2015)  . Coronary artery disease   . GERD (gastroesophageal reflux disease)    previously on aciphex, discontinued november 2012  because patient asymptomatic, and concern about interference with HIV meds.  May try pepcid in the future if symptoms return  . Heart murmur   . Hepatitis C antibody test positive   . HIV infection ([place])    diagnosed before 2008  . Hypertension   . Iron deficiency anemia    Ferritin = 2 in november 2012, started on iron supplemenation  . Pleuritic chest pain 01/07/2018  . Seizures ([place])   . Varicosities     Past Surgical History:  Procedure Laterality Date  . ABDOMINAL HYSTERECTOMY    . CARDIAC CATHETERIZATION N/A 11/08/2015   Procedure: Left Heart Cath and Coronary Angiography;  Surgeon: Jettie Booze, MD;  Location: [place] [facility];  Service: Cardiovascular;  Laterality: N/A;  . COLONOSCOPY  10/13/11   small adenoma, anal condyloma  . COLONOSCOPY  10/13/2011   Procedure: COLONOSCOPY;  Surgeon:  Gatha Mayer, MD;  Location: [place];  Service: Endoscopy;  Laterality: N/A;  . COLONOSCOPY N/A 09/14/2014   Procedure: COLONOSCOPY;  Surgeon: Arta Silence, MD;  Location: [facility] ENDOSCOPY;  Service: Endoscopy;  Laterality: N/A;  . ESOPHAGOGASTRODUODENOSCOPY  10/13/11   small hiatal hernia  . ESOPHAGOGASTRODUODENOSCOPY  10/13/2011   Procedure: ESOPHAGOGASTRODUODENOSCOPY (EGD);  Surgeon: Gatha Mayer, MD;  Location: [facility] ENDOSCOPY;  Service: Endoscopy;  Laterality: N/A;  .  ESOPHAGOGASTRODUODENOSCOPY N/A 09/14/2014   Procedure: ESOPHAGOGASTRODUODENOSCOPY (EGD);  Surgeon: Arta Silence, MD;  Location: [facility] ENDOSCOPY;  Service: Endoscopy;  Laterality: N/A;  . GIVENS CAPSULE STUDY N/A 09/14/2014   Procedure: GIVENS CAPSULE STUDY;  Surgeon: Arta Silence, MD;  Location: [facility] ENDOSCOPY;  Service: Endoscopy;  Laterality: N/A;     Medications: Current Outpatient Medications  Medication Sig Dispense Refill  . acyclovir (ZOVIRAX) 400 MG tablet Take 1 tablet (400 mg total) by mouth daily as needed (for flare ups). 30 tablet 3  . albuterol (PROAIR HFA) 108 (90 Base) MCG/ACT inhaler inhale 2 puffs by mouth every 4 hours if needed for wheezing or shortness of breath 8.5 g 2  . albuterol (PROVENTIL) (2.5 MG/3ML) 0.083% nebulizer solution Take 3 mLs (2.5 mg total) by nebulization every 6 (six) hours as needed for wheezing or shortness of breath. 75 mL 3  . amLODipine (NORVASC) 10 MG tablet Take 1 tablet (10 mg total) by mouth daily. 90 tablet 0  . Ascorbic Acid (VITAMIN C) 1000 MG tablet Take 1,000 mg by mouth daily.    Marland Kitchen atorvastatin (LIPITOR) 40 MG tablet take 1 tablet by mouth once daily AT 6 PM 90 tablet 0  . Cetirizine HCl (ZYRTEC ALLERGY) 10 MG TBDP Take 1 tablet by mouth daily. 90 tablet 3  . chlorthalidone (HYGROTON) 25 MG tablet Take 1 tablet (25 mg total) by mouth daily. 90 tablet 3  . cholecalciferol (VITAMIN D) 1000 units tablet Take 1,000 Units by mouth daily.    . cyclobenzaprine (FLEXERIL) 5 MG tablet take 1 tablet by mouth two times a day if needed for muscle spasm 30 tablet 0  . diazepam (VALIUM) 2 MG tablet take 1 tablet by mouth every 6 hours if needed for anxiety 30 tablet 0  . dolutegravir (TIVICAY) 50 MG tablet Take 1 tablet (50 mg total) by mouth daily. 30 tablet 11  . emtricitabine-tenofovir AF (DESCOVY) 200-25 MG tablet Take 1 tablet by mouth daily. 90 tablet 3  . famotidine (PEPCID) 20 MG tablet Take 1 tablet (20 mg total) by mouth 2 (two) times daily. 60  tablet 3  . ferrous sulfate 325 (65 FE) MG EC tablet Take 325 mg by mouth daily.    . Flaxseed, Linseed, (FLAX SEED OIL) 1000 MG CAPS Take 1,000 mg by mouth daily.    Marland Kitchen gabapentin (NEURONTIN) 600 MG tablet take 1 tablet by mouth three times a day 90 tablet 0  . guaiFENesin (ROBITUSSIN) 100 MG/5ML SOLN Take 5 mLs (100 mg total) by mouth every 4 (four) hours as needed for cough or to loosen phlegm. 1200 mL 0  . hydroxypropyl methylcellulose / hypromellose (ISOPTO TEARS / GONIOVISC) 2.5 % ophthalmic solution Place 1 drop into both eyes as needed for dry eyes.    . isosorbide mononitrate (IMDUR) 60 MG 24 hr tablet Take 1 tablet (60 mg total) by mouth daily. 90 tablet 0  . lisinopril (PRINIVIL,ZESTRIL) 20 MG tablet Take 1 tablet (20 mg total) by mouth daily. 90 tablet 3  . Nutritional Supplements (ENSURE NUTRA SHAKE  HI-CAL) LIQD Take 1 Can by mouth 2 (two) times daily. 90 Can 11  . Omega-3 Fatty Acids (FISH OIL) 1000 MG CPDR Take 1,000 mg by mouth daily.    . ondansetron (ZOFRAN-ODT) 4 MG disintegrating tablet Take 1 tablet (4 mg total) by mouth daily as needed for nausea or vomiting. 20 tablet 3  . pantoprazole (PROTONIX) 40 MG tablet Take 1 tablet (40 mg total) by mouth daily. 90 tablet 0  . RA ASPIRIN EC ADULT LOW ST 81 MG EC tablet Take 1 tablet (81 mg total) by mouth daily. 30 tablet 5  . spironolactone (ALDACTONE) 25 MG tablet Take 1 tablet (25 mg total) by mouth daily. 90 tablet 0  . triamcinolone cream (KENALOG) 0.1 % Apply 1 application topically 2 (two) times daily. 30 g 0  . vitamin A 10000 UNIT capsule Take 10,000 Units by mouth daily.    . vitamin B-12 (CYANOCOBALAMIN) 1000 MCG tablet Take 1,000 mcg by mouth daily.     No current facility-administered medications for this visit.     Allergies: Allergies  Allergen Reactions  . Penicillins     REACTION: GOES INTO SHOCK & THEN PASSES OU Has patient had a PCN reaction causing immediate rash, facial/tongue/throat swelling, SOB or  lightheadedness with hypotension: NO Has patient had a PCN reaction causing severe rash involving mucus membranes or skin necrosis: NO Has patient had a PCN reaction that required hospitalization NO Has patient had a PCN reaction occurring within the last 10 years: NO If all of the above answers are "NO", then may proceed with Cephalosporin use.   . Avelox [Moxifloxacin Hcl In Nacl] Itching and Rash    Social History: The patient  reports that she has been smoking cigarettes.  She has a 37.50 pack-year smoking history. she has never used smokeless tobacco. She reports that she drinks about 6.0 oz of alcohol per week. She reports that she does not use drugs.   Family History: The patient's family history includes Colon cancer in her sister; Diabetes in her mother; Ovarian cancer in her sister.   Review of Systems: Please see the history of present illness.   Otherwise, the review of systems is positive for cramps in the right leg - has with movement .   All other systems are reviewed and negative.   Physical Exam: VS:  There were no vitals taken for this visit. Marland Kitchen  BMI There is no height or weight on file to calculate BMI.  Wt Readings from Last 3 Encounters:  01/20/18 136 lb (61.7 kg)  01/07/18 136 lb (61.7 kg)  12/16/17 136 lb (61.7 kg)   BP 136/68 mmHg, HR 48 BPM General: Pleasant. Well developed, well nourished and in no acute distress. She smells of tobacco.  HEENT: Normal.  Neck: Supple, no JVD, carotid bruits, or masses noted.  Cardiac: Regular rate and rhythm. Outflow murmur noted. No edema.  Respiratory:  Lungs are clear to auscultation bilaterally with normal work of breathing.  GI: Soft and nontender.  MS: No deformity or atrophy. Gait and ROM intact.  Skin: Warm and dry. Color is normal.  Neuro:  Strength and sensation are intact and no gross focal deficits noted.  Psych: Alert, appropriate and with normal affect.  LABORATORY DATA:  EKG:  EKG is not ordered  today.  Lab Results  Component Value Date   WBC 7.8 01/07/2018   HGB 12.8 01/07/2018   HCT 39.6 01/07/2018   PLT 221 01/07/2018   GLUCOSE 115 (H)  01/07/2018   CHOL 111 11/10/2017   TRIG 89 11/10/2017   HDL 59 11/10/2017   LDLCALC 24 05/15/2017   ALT 8 11/10/2017   AST 15 11/10/2017   NA 139 01/07/2018   K 4.6 01/07/2018   CL 102 01/07/2018   CREATININE 0.83 01/07/2018   BUN 23 (H) 01/07/2018   CO2 28 01/07/2018   TSH 0.81 03/01/2016   INR 1.16 09/12/2014   Other Studies Reviewed Today: Cardiac Cath Conclusion from 11/08/2015     Prox RCA to Mid RCA lesion, 80% stenosed.  Mid RCA lesion, 75% stenosed.  Mid Cx lesion, 50% stenosed.  Mid LAD lesion, 10% stenosed.  There is moderate left ventricular systolic dysfunction with distal inferior and apical hypokinesis.  RPDA-severely diseased, 95% stenosed.  Complex, calcific RCA disease. The distal PDA is small and diffusely diseased. Her circumflex is codominant. I think medical therapy is the best treatemnt at this time given her issues with GI bleeding. Rotational atherectomy would be needed and high risk given the tortuousity.   Further cardiac plan per Dr. Meda Coffee and the inpatient team.   Echo Study Conclusions from 11/2015  - Left ventricle: The cavity size was normal. There was moderate concentric hypertrophy. Systolic function was hyperdynamic. The estimated ejection fraction was in the range of 70% to 75%. There was dynamic obstruction at restin the mid cavity, with a peak velocity of 240 cm/sec and a peak gradient of 23 mm Hg. Wall motion was normal; there were no regional wall motion abnormalities. Doppler parameters are consistent with abnormal left ventricular relaxation (grade 1 diastolic dysfunction). Doppler parameters are consistent with elevated mean left atrial filling pressure. - Mitral valve: Calcified annulus. There was mild regurgitation. - Left atrium: The atrium was  moderately dilated. - Pulmonary arteries: PA peak pressure: 32 mm Hg (S).  Impressions: - No change from April 2015 study.  ECG: SB with sinus arrhythmia, LAE, LVH, previously escape junctional rhythm with PVCs.   Assessment/Plan:  1. Hypertensive urgency - great response with lisinopril, BP too low for her, I will decrease to 2.5 mg po daily. Her echocardiogram shows hyperdynamic LVEF with intracavitary gradient of 56 mmHg, she will benefit from BB once has a PM.  2. Symptomatic bradycardia - previously HR down to 30' during awake hours, ECGs showing escape junctional rhythm with PVCs, recommended PM placement but refused. Now more symptomatic and agrees to a PM placement, she is seeing Dr Rayann Heman on 2/27.  3. CAD - no recent chest pain, continue asa 81 mg po daily and atorvastatin 40 mg po daily  4. Aortic stenosis - mild to moderate - we will follow  5. Tobacco abuse - cessation discussed  6. Anemia/GI bleed - followed by [facility] - she is now stable on baby aspirin with stable hemoglobin 12.8 in 12/2017.   7. Hyperlipidemia on atorvastatin 40 mg daily that is well tolerated, LFTs are normal, LDL 35, HDL 59, TG 89.  Follow-up in 2 months.  Signed: Ena Dawley, MD  01/28/2018 12:40 PM  [facility] [address] [place], [place]  [address] Phone: [phone] Fax: [phone]

## 2018-01-28 NOTE — Patient Instructions (Signed)
Medication Instructions:  Your physician has recommended you make the following change in your medication:   DECREASE: Lisinopril to 2.5 mg daily  Labwork: None ordered  Testing/Procedures: None ordered  Follow-Up: Your physician recommends that you schedule a follow-up appointment in: 2 months with Dr. Meda Coffee    Any Other Special Instructions Will Be Listed Below (If Applicable).     If you need a refill on your cardiac medications before your next appointment, please call your pharmacy.

## 2018-01-29 ENCOUNTER — Ambulatory Visit: Payer: Medicare Other

## 2018-01-30 ENCOUNTER — Ambulatory Visit: Payer: Medicare Other | Admitting: Cardiology

## 2018-02-04 ENCOUNTER — Ambulatory Visit (INDEPENDENT_AMBULATORY_CARE_PROVIDER_SITE_OTHER): Payer: Medicare Other | Admitting: Internal Medicine

## 2018-02-04 ENCOUNTER — Encounter: Payer: Self-pay | Admitting: Internal Medicine

## 2018-02-04 VITALS — BP 188/88 | HR 50 | Ht 62.0 in | Wt 141.0 lb

## 2018-02-04 DIAGNOSIS — R001 Bradycardia, unspecified: Secondary | ICD-10-CM

## 2018-02-04 DIAGNOSIS — I495 Sick sinus syndrome: Secondary | ICD-10-CM | POA: Diagnosis not present

## 2018-02-04 DIAGNOSIS — I119 Hypertensive heart disease without heart failure: Secondary | ICD-10-CM | POA: Diagnosis not present

## 2018-02-04 NOTE — Progress Notes (Signed)
PCP: Campbell Riches, MD  Primary EP: Dr Rayann Heman  Nicole Mccormick is a 72 y.o. female who presents today for routine electrophysiology followup.  Since last being seen in our clinic, the patient reports doing reasonably well.  She was recently hospitalized at [facility] (notes reviewed).  She reports SOB and decreased exercise tolerance.  She was found to have bradycardia.  Today, she denies symptoms of palpitations, chest pain,  lower extremity edema, dizziness, presyncope, or syncope.  The patient is otherwise without complaint today.   Past Medical History:  Diagnosis Date  . Abnormal Pap smear   . Arthritis   . Asthma   . CHF (congestive heart failure) ([place])   . Complication of anesthesia    "they have a hard time bringing me back" (11/08/2015)  . Coronary artery disease   . GERD (gastroesophageal reflux disease)    previously on aciphex, discontinued november 2012  because patient asymptomatic, and concern about interference with HIV meds.  May try pepcid in the future if symptoms return  . Heart murmur   . Hepatitis C antibody test positive   . HIV infection ([place])    diagnosed before 2008  . Hypertension   . Iron deficiency anemia    Ferritin = 2 in november 2012, started on iron supplemenation  . Pleuritic chest pain 01/07/2018  . Seizures ([place])   . Varicosities    Past Surgical History:  Procedure Laterality Date  . ABDOMINAL HYSTERECTOMY    . CARDIAC CATHETERIZATION N/A 11/08/2015   Procedure: Left Heart Cath and Coronary Angiography;  Surgeon: Jettie Booze, MD;  Location: [place] [facility];  Service: Cardiovascular;  Laterality: N/A;  . COLONOSCOPY  10/13/11   small adenoma, anal condyloma  . COLONOSCOPY  10/13/2011   Procedure: COLONOSCOPY;  Surgeon: Gatha Mayer, MD;  Location: [place];  Service: Endoscopy;  Laterality: N/A;  . COLONOSCOPY N/A 09/14/2014   Procedure: COLONOSCOPY;  Surgeon: Arta Silence, MD;  Location: [facility] ENDOSCOPY;  Service: Endoscopy;   Laterality: N/A;  . ESOPHAGOGASTRODUODENOSCOPY  10/13/11   small hiatal hernia  . ESOPHAGOGASTRODUODENOSCOPY  10/13/2011   Procedure: ESOPHAGOGASTRODUODENOSCOPY (EGD);  Surgeon: Gatha Mayer, MD;  Location: [facility] ENDOSCOPY;  Service: Endoscopy;  Laterality: N/A;  . ESOPHAGOGASTRODUODENOSCOPY N/A 09/14/2014   Procedure: ESOPHAGOGASTRODUODENOSCOPY (EGD);  Surgeon: Arta Silence, MD;  Location: [facility] ENDOSCOPY;  Service: Endoscopy;  Laterality: N/A;  . GIVENS CAPSULE STUDY N/A 09/14/2014   Procedure: GIVENS CAPSULE STUDY;  Surgeon: Arta Silence, MD;  Location: [facility] ENDOSCOPY;  Service: Endoscopy;  Laterality: N/A;    ROS- all systems are reviewed and negatives except as per HPI above  Current Outpatient Medications  Medication Sig Dispense Refill  . acyclovir (ZOVIRAX) 400 MG tablet Take 1 tablet (400 mg total) by mouth daily as needed (for flare ups). 30 tablet 3  . albuterol (PROAIR HFA) 108 (90 Base) MCG/ACT inhaler inhale 2 puffs by mouth every 4 hours if needed for wheezing or shortness of breath 8.5 g 2  . albuterol (PROVENTIL) (2.5 MG/3ML) 0.083% nebulizer solution Take 3 mLs (2.5 mg total) by nebulization every 6 (six) hours as needed for wheezing or shortness of breath. 75 mL 3  . amLODipine (NORVASC) 10 MG tablet Take 1 tablet (10 mg total) by mouth daily. 90 tablet 0  . Ascorbic Acid (VITAMIN C) 1000 MG tablet Take 1,000 mg by mouth daily.    Marland Kitchen atorvastatin (LIPITOR) 40 MG tablet take 1 tablet by mouth once daily AT 6 PM  90 tablet 0  . Cetirizine HCl (ZYRTEC ALLERGY) 10 MG TBDP Take 1 tablet by mouth daily. 90 tablet 3  . chlorthalidone (HYGROTON) 25 MG tablet Take 1 tablet (25 mg total) by mouth daily. 90 tablet 3  . cholecalciferol (VITAMIN D) 1000 units tablet Take 1,000 Units by mouth daily.    . cyclobenzaprine (FLEXERIL) 5 MG tablet take 1 tablet by mouth two times a day if needed for muscle spasm 30 tablet 0  . diazepam (VALIUM) 2 MG tablet take 1 tablet by mouth every 6 hours if  needed for anxiety 30 tablet 0  . dolutegravir (TIVICAY) 50 MG tablet Take 1 tablet (50 mg total) by mouth daily. 30 tablet 11  . emtricitabine-tenofovir AF (DESCOVY) 200-25 MG tablet Take 1 tablet by mouth daily. 90 tablet 3  . famotidine (PEPCID) 20 MG tablet Take 1 tablet (20 mg total) by mouth 2 (two) times daily. 60 tablet 3  . ferrous sulfate 325 (65 FE) MG EC tablet Take 325 mg by mouth daily.    . Flaxseed, Linseed, (FLAX SEED OIL) 1000 MG CAPS Take 1,000 mg by mouth daily.    Marland Kitchen gabapentin (NEURONTIN) 600 MG tablet take 1 tablet by mouth three times a day 90 tablet 0  . guaiFENesin (ROBITUSSIN) 100 MG/5ML SOLN Take 5 mLs (100 mg total) by mouth every 4 (four) hours as needed for cough or to loosen phlegm. 1200 mL 0  . hydroxypropyl methylcellulose / hypromellose (ISOPTO TEARS / GONIOVISC) 2.5 % ophthalmic solution Place 1 drop into both eyes as needed for dry eyes.    . isosorbide mononitrate (IMDUR) 60 MG 24 hr tablet Take 1 tablet (60 mg total) by mouth daily. 90 tablet 0  . lisinopril (PRINIVIL,ZESTRIL) 2.5 MG tablet Take 1 tablet (2.5 mg total) by mouth daily. 90 tablet 3  . Omega-3 Fatty Acids (FISH OIL) 1000 MG CPDR Take 1,000 mg by mouth daily.    . ondansetron (ZOFRAN-ODT) 4 MG disintegrating tablet Take 1 tablet (4 mg total) by mouth daily as needed for nausea or vomiting. 20 tablet 3  . pantoprazole (PROTONIX) 40 MG tablet Take 1 tablet (40 mg total) by mouth daily. 90 tablet 0  . RA ASPIRIN EC ADULT LOW ST 81 MG EC tablet Take 1 tablet (81 mg total) by mouth daily. 30 tablet 5  . spironolactone (ALDACTONE) 25 MG tablet Take 1 tablet (25 mg total) by mouth daily. 90 tablet 0  . triamcinolone cream (KENALOG) 0.1 % Apply 1 application topically 2 (two) times daily. 30 g 0  . vitamin A 10000 UNIT capsule Take 10,000 Units by mouth daily.    . vitamin B-12 (CYANOCOBALAMIN) 1000 MCG tablet Take 1,000 mcg by mouth daily.     No current facility-administered medications for this visit.      Physical Exam: Vitals:   02/04/18 0900  BP: (!) 188/88  Pulse: (!) 50  SpO2: 97%  Weight: 141 lb (64 kg)  Height: 5\' 2"  (1.575 m)    GEN- The patient is well appearing, alert and oriented x 3 today.   Head- normocephalic, atraumatic Eyes-  Sclera clear, conjunctiva pink Ears- hearing intact Oropharynx- clear Lungs- Clear to ausculation bilaterally, normal work of breathing Heart- Regular rate and rhythm, no murmurs, rubs or gallops, PMI not laterally displaced GI- soft, NT, ND, + BS Extremities- no clubbing, cyanosis, or edema  EKG tracing ordered today shows sinus bradycardia  Assessment and Plan:  1. Sinus bradycardia The patient has symptomatic bradycardia.  I would therefore recommend pacemaker implantation at this time.  Risks, benefits, alternatives to pacemaker implantation were discussed in detail with the patient today. The patient understands that the risks include but are not limited to bleeding, infection, pneumothorax, perforation, tamponade, vascular damage, renal failure, MI, stroke, death,  and lead dislodgement and wishes to proceed. We will therefore schedule the procedure at the next available time.  2. Hypertensive cardiovascular disease Stable No change required today  3. SOB Unclear etiology  Possibly due to bradycardia Per Dr [name] recent note, she does not feel ischemic workup is currently required.  If symptoms persist after PPM, would reassess.  Mid cavitary gradient also noted on echo  4. Tobacco Cessation advised   Thompson Grayer MD, [facility] 02/04/2018 9:21 AM

## 2018-02-04 NOTE — Patient Instructions (Addendum)
Medication Instructions:  Your physician recommends that you continue on your current medications as directed. Please refer to the Current Medication list given to you today.  Labwork: TODAY: CBC/BMET    Testing/Procedures: Your physician has recommended that you have a pacemaker inserted TOMORROW 02/05/18. A pacemaker is a small device that is placed under the skin of your chest or abdomen to help control abnormal heart rhythms. This device uses electrical pulses to prompt the heart to beat at a normal rate. Pacemakers are used to treat heart rhythms that are too slow. Wire (leads) are attached to the pacemaker that goes into the chambers of you heart. This is done in the hospital and usually requires and overnight stay. Please see the instruction sheet given to you today for more information.    Follow-Up: Follow-up for a wound check in the [facility] 10-14 days after procedure and with Dr. Rayann Heman 91 days after procedure.      Procedure Instructions:  Report to the [facility] at 2:00 PM TOMORROW 02/05/18  Nothing to eat or drink after midnight the night before procedure  On the day of procedure do not take any medicines  You will need someone to drive you home after the procedure  Please wash with the Surgical Scub the night before and morning of procedure (follow instruction page "Preparing For Surgery").     Pacemaker Implantation, Adult Pacemaker implantation is a procedure to place a pacemaker inside your chest. A pacemaker is a small computer that sends electrical signals to the heart and helps your heart beat normally. A pacemaker also stores information about your heart rhythms. You may need pacemaker implantation if you:  Have a slow heartbeat (bradycardia).  Faint (syncope).  Have shortness of breath (dyspnea) due to heart problems.  The pacemaker attaches to your heart through a wire, called a lead. Sometimes just one lead is  needed. Other times, there will be two leads. There are two types of pacemakers:  Transvenous pacemaker. This type is placed under the skin or muscle of your chest. The lead goes through a vein in the chest area to reach the inside of the heart.  Epicardial pacemaker. This type is placed under the skin or muscle of your chest or belly. The lead goes through your chest to the outside of the heart.  Tell a health care provider about:  Any allergies you have.  All medicines you are taking, including vitamins, herbs, eye drops, creams, and over-the-counter medicines.  Any problems you or family members have had with anesthetic medicines.  Any blood or bone disorders you have.  Any surgeries you have had.  Any medical conditions you have.  Whether you are pregnant or may be pregnant. What are the risks? Generally, this is a safe procedure. However, problems may occur, including:  Infection.  Bleeding.  Failure of the pacemaker or the lead.  Collapse of a lung or bleeding into a lung.  Blood clot inside a blood vessel with a lead.  Damage to the heart.  Infection inside the heart (endocarditis).  Allergic reactions to medicines.  What happens before the procedure? Staying hydrated Follow instructions from your health care provider about hydration, which may include:  Up to 2 hours before the procedure - you may continue to drink clear liquids, such as water, clear fruit juice, black coffee, and plain tea.  Eating and drinking restrictions Follow instructions from your health care provider about eating and drinking, which  may include:  8 hours before the procedure - stop eating heavy meals or foods such as meat, fried foods, or fatty foods.  6 hours before the procedure - stop eating light meals or foods, such as toast or cereal.  6 hours before the procedure - stop drinking milk or drinks that contain milk.  2 hours before the procedure - stop drinking clear  liquids.  Medicines  Ask your health care provider about: ? Changing or stopping your regular medicines. This is especially important if you are taking diabetes medicines or blood thinners. ? Taking medicines such as aspirin and ibuprofen. These medicines can thin your blood. Do not take these medicines before your procedure if your health care provider instructs you not to.  You may be given antibiotic medicine to help prevent infection. General instructions  You will have a heart evaluation. This may include an electrocardiogram (ECG), chest X-ray, and heart imaging (echocardiogram,  or echo) tests.  You will have blood tests.  Do not use any products that contain nicotine or tobacco, such as cigarettes and e-cigarettes. If you need help quitting, ask your health care provider.  Plan to have someone take you home from the hospital or clinic.  If you will be going home right after the procedure, plan to have someone with you for 24 hours.  Ask your health care provider how your surgical site will be marked or identified. What happens during the procedure?  To reduce your risk of infection: ? Your health care team will wash or sanitize their hands. ? Your skin will be washed with soap. ? Hair may be removed from the surgical area.  An IV tube will be inserted into one of your veins.  You will be given one or more of the following: ? A medicine to help you relax (sedative). ? A medicine to numb the area (local anesthetic). ? A medicine to make you fall asleep (general anesthetic).  If you are getting a transvenous pacemaker: ? An incision will be made in your upper chest. ? A pocket will be made for the pacemaker. It may be placed under the skin or between layers of muscle. ? The lead will be inserted into a blood vessel that returns to the heart. ? While X-rays are taken by an imaging machine (fluoroscopy), the lead will be advanced through the vein to the inside of your  heart. ? The other end of the lead will be tunneled under the skin and attached to the pacemaker.  If you are getting an epicardial pacemaker: ? An incision will be made near your ribs or breastbone (sternum) for the lead. ? The lead will be attached to the outside of your heart. ? Another incision will be made in your chest or upper belly to create a pocket for the pacemaker. ? The free end of the lead will be tunneled under the skin and attached to the pacemaker.  The transvenous or epicardial pacemaker will be tested. Imaging studies may be done to check the lead position.  The incisions will be closed with stitches (sutures), adhesive strips, or skin glue.  Bandages (dressing) will be placed over the incisions. The procedure may vary among health care providers and hospitals. What happens after the procedure?  Your blood pressure, heart rate, breathing rate, and blood oxygen level will be monitored until the medicines you were given have worn off.  You will be given antibiotics and pain medicine.  ECG and chest x-rays will be  done.  You will wear a continuous type of ECG (Holter monitor) to check your heart rhythm.  Your health care provider willprogram the pacemaker.  Do not drive for 24 hours if you received a sedative. This information is not intended to replace advice given to you by your health care provider. Make sure you discuss any questions you have with your health care provider. Document Released: 11/15/2002 Document Revised: 06/14/2016 Document Reviewed: 05/08/2016 Elsevier Interactive Patient Education  [employer].

## 2018-02-04 NOTE — H&P (View-Only) (Signed)
PCP: Campbell Riches, MD  Primary EP: Dr Rayann Heman  Nicole Mccormick is a 72 y.o. female who presents today for routine electrophysiology followup.  Since last being seen in our clinic, the patient reports doing reasonably well.  She was recently hospitalized at [facility] (notes reviewed).  She reports SOB and decreased exercise tolerance.  She was found to have bradycardia.  Today, she denies symptoms of palpitations, chest pain,  lower extremity edema, dizziness, presyncope, or syncope.  The patient is otherwise without complaint today.   Past Medical History:  Diagnosis Date  . Abnormal Pap smear   . Arthritis   . Asthma   . CHF (congestive heart failure) ([place])   . Complication of anesthesia    "they have a hard time bringing me back" (11/08/2015)  . Coronary artery disease   . GERD (gastroesophageal reflux disease)    previously on aciphex, discontinued november 2012  because patient asymptomatic, and concern about interference with HIV meds.  May try pepcid in the future if symptoms return  . Heart murmur   . Hepatitis C antibody test positive   . HIV infection ([place])    diagnosed before 2008  . Hypertension   . Iron deficiency anemia    Ferritin = 2 in november 2012, started on iron supplemenation  . Pleuritic chest pain 01/07/2018  . Seizures ([place])   . Varicosities    Past Surgical History:  Procedure Laterality Date  . ABDOMINAL HYSTERECTOMY    . CARDIAC CATHETERIZATION N/A 11/08/2015   Procedure: Left Heart Cath and Coronary Angiography;  Surgeon: Jettie Booze, MD;  Location: [facility];  Service: Cardiovascular;  Laterality: N/A;  . COLONOSCOPY  10/13/11   small adenoma, anal condyloma  . COLONOSCOPY  10/13/2011   Procedure: COLONOSCOPY;  Surgeon: Gatha Mayer, MD;  Location: [place];  Service: Endoscopy;  Laterality: N/A;  . COLONOSCOPY N/A 09/14/2014   Procedure: COLONOSCOPY;  Surgeon: Arta Silence, MD;  Location: [facility] ENDOSCOPY;  Service: Endoscopy;   Laterality: N/A;  . ESOPHAGOGASTRODUODENOSCOPY  10/13/11   small hiatal hernia  . ESOPHAGOGASTRODUODENOSCOPY  10/13/2011   Procedure: ESOPHAGOGASTRODUODENOSCOPY (EGD);  Surgeon: Gatha Mayer, MD;  Location: [facility] ENDOSCOPY;  Service: Endoscopy;  Laterality: N/A;  . ESOPHAGOGASTRODUODENOSCOPY N/A 09/14/2014   Procedure: ESOPHAGOGASTRODUODENOSCOPY (EGD);  Surgeon: Arta Silence, MD;  Location: [facility] ENDOSCOPY;  Service: Endoscopy;  Laterality: N/A;  . GIVENS CAPSULE STUDY N/A 09/14/2014   Procedure: GIVENS CAPSULE STUDY;  Surgeon: Arta Silence, MD;  Location: [facility] ENDOSCOPY;  Service: Endoscopy;  Laterality: N/A;    ROS- all systems are reviewed and negatives except as per HPI above  Current Outpatient Medications  Medication Sig Dispense Refill  . acyclovir (ZOVIRAX) 400 MG tablet Take 1 tablet (400 mg total) by mouth daily as needed (for flare ups). 30 tablet 3  . albuterol (PROAIR HFA) 108 (90 Base) MCG/ACT inhaler inhale 2 puffs by mouth every 4 hours if needed for wheezing or shortness of breath 8.5 g 2  . albuterol (PROVENTIL) (2.5 MG/3ML) 0.083% nebulizer solution Take 3 mLs (2.5 mg total) by nebulization every 6 (six) hours as needed for wheezing or shortness of breath. 75 mL 3  . amLODipine (NORVASC) 10 MG tablet Take 1 tablet (10 mg total) by mouth daily. 90 tablet 0  . Ascorbic Acid (VITAMIN C) 1000 MG tablet Take 1,000 mg by mouth daily.    Marland Kitchen atorvastatin (LIPITOR) 40 MG tablet take 1 tablet by mouth once daily AT 6 PM  90 tablet 0  . Cetirizine HCl (ZYRTEC ALLERGY) 10 MG TBDP Take 1 tablet by mouth daily. 90 tablet 3  . chlorthalidone (HYGROTON) 25 MG tablet Take 1 tablet (25 mg total) by mouth daily. 90 tablet 3  . cholecalciferol (VITAMIN D) 1000 units tablet Take 1,000 Units by mouth daily.    . cyclobenzaprine (FLEXERIL) 5 MG tablet take 1 tablet by mouth two times a day if needed for muscle spasm 30 tablet 0  . diazepam (VALIUM) 2 MG tablet take 1 tablet by mouth every 6 hours if  needed for anxiety 30 tablet 0  . dolutegravir (TIVICAY) 50 MG tablet Take 1 tablet (50 mg total) by mouth daily. 30 tablet 11  . emtricitabine-tenofovir AF (DESCOVY) 200-25 MG tablet Take 1 tablet by mouth daily. 90 tablet 3  . famotidine (PEPCID) 20 MG tablet Take 1 tablet (20 mg total) by mouth 2 (two) times daily. 60 tablet 3  . ferrous sulfate 325 (65 FE) MG EC tablet Take 325 mg by mouth daily.    . Flaxseed, Linseed, (FLAX SEED OIL) 1000 MG CAPS Take 1,000 mg by mouth daily.    Marland Kitchen gabapentin (NEURONTIN) 600 MG tablet take 1 tablet by mouth three times a day 90 tablet 0  . guaiFENesin (ROBITUSSIN) 100 MG/5ML SOLN Take 5 mLs (100 mg total) by mouth every 4 (four) hours as needed for cough or to loosen phlegm. 1200 mL 0  . hydroxypropyl methylcellulose / hypromellose (ISOPTO TEARS / GONIOVISC) 2.5 % ophthalmic solution Place 1 drop into both eyes as needed for dry eyes.    . isosorbide mononitrate (IMDUR) 60 MG 24 hr tablet Take 1 tablet (60 mg total) by mouth daily. 90 tablet 0  . lisinopril (PRINIVIL,ZESTRIL) 2.5 MG tablet Take 1 tablet (2.5 mg total) by mouth daily. 90 tablet 3  . Omega-3 Fatty Acids (FISH OIL) 1000 MG CPDR Take 1,000 mg by mouth daily.    . ondansetron (ZOFRAN-ODT) 4 MG disintegrating tablet Take 1 tablet (4 mg total) by mouth daily as needed for nausea or vomiting. 20 tablet 3  . pantoprazole (PROTONIX) 40 MG tablet Take 1 tablet (40 mg total) by mouth daily. 90 tablet 0  . RA ASPIRIN EC ADULT LOW ST 81 MG EC tablet Take 1 tablet (81 mg total) by mouth daily. 30 tablet 5  . spironolactone (ALDACTONE) 25 MG tablet Take 1 tablet (25 mg total) by mouth daily. 90 tablet 0  . triamcinolone cream (KENALOG) 0.1 % Apply 1 application topically 2 (two) times daily. 30 g 0  . vitamin A 10000 UNIT capsule Take 10,000 Units by mouth daily.    . vitamin B-12 (CYANOCOBALAMIN) 1000 MCG tablet Take 1,000 mcg by mouth daily.     No current facility-administered medications for this visit.      Physical Exam: Vitals:   02/04/18 0900  BP: (!) 188/88  Pulse: (!) 50  SpO2: 97%  Weight: 141 lb (64 kg)  Height: 5\' 2"  (1.575 m)    GEN- The patient is well appearing, alert and oriented x 3 today.   Head- normocephalic, atraumatic Eyes-  Sclera clear, conjunctiva pink Ears- hearing intact Oropharynx- clear Lungs- Clear to ausculation bilaterally, normal work of breathing Heart- Regular rate and rhythm, no murmurs, rubs or gallops, PMI not laterally displaced GI- soft, NT, ND, + BS Extremities- no clubbing, cyanosis, or edema  EKG tracing ordered today shows sinus bradycardia  Assessment and Plan:  1. Sinus bradycardia The patient has symptomatic bradycardia.  I would therefore recommend pacemaker implantation at this time.  Risks, benefits, alternatives to pacemaker implantation were discussed in detail with the patient today. The patient understands that the risks include but are not limited to bleeding, infection, pneumothorax, perforation, tamponade, vascular damage, renal failure, MI, stroke, death,  and lead dislodgement and wishes to proceed. We will therefore schedule the procedure at the next available time.  2. Hypertensive cardiovascular disease Stable No change required today  3. SOB Unclear etiology  Possibly due to bradycardia Per Dr [name] recent note, she does not feel ischemic workup is currently required.  If symptoms persist after PPM, would reassess.  Mid cavitary gradient also noted on echo  4. Tobacco Cessation advised   Thompson Grayer MD, [facility] 02/04/2018 9:21 AM

## 2018-02-05 ENCOUNTER — Ambulatory Visit (HOSPITAL_COMMUNITY)
Admission: RE | Admit: 2018-02-05 | Discharge: 2018-02-06 | Disposition: A | Discharge status: Home / Self Care | Payer: Medicare Other | Source: Ambulatory Visit | Attending: Internal Medicine | Admitting: Internal Medicine

## 2018-02-05 ENCOUNTER — Ambulatory Visit (HOSPITAL_COMMUNITY)
Admission: RE | Disposition: A | Discharge status: Home / Self Care | Payer: Self-pay | Source: Ambulatory Visit | Attending: Internal Medicine

## 2018-02-05 DIAGNOSIS — I2510 Atherosclerotic heart disease of native coronary artery without angina pectoris: Secondary | ICD-10-CM | POA: Insufficient documentation

## 2018-02-05 DIAGNOSIS — Z79899 Other long term (current) drug therapy: Secondary | ICD-10-CM | POA: Diagnosis not present

## 2018-02-05 DIAGNOSIS — B20 Human immunodeficiency virus [HIV] disease: Secondary | ICD-10-CM | POA: Insufficient documentation

## 2018-02-05 DIAGNOSIS — I110 Hypertensive heart disease with heart failure: Secondary | ICD-10-CM | POA: Insufficient documentation

## 2018-02-05 DIAGNOSIS — J45909 Unspecified asthma, uncomplicated: Secondary | ICD-10-CM | POA: Diagnosis not present

## 2018-02-05 DIAGNOSIS — I509 Heart failure, unspecified: Secondary | ICD-10-CM | POA: Diagnosis not present

## 2018-02-05 DIAGNOSIS — Z959 Presence of cardiac and vascular implant and graft, unspecified: Secondary | ICD-10-CM

## 2018-02-05 DIAGNOSIS — Z880 Allergy status to penicillin: Secondary | ICD-10-CM | POA: Insufficient documentation

## 2018-02-05 DIAGNOSIS — I495 Sick sinus syndrome: Secondary | ICD-10-CM | POA: Insufficient documentation

## 2018-02-05 DIAGNOSIS — Z7982 Long term (current) use of aspirin: Secondary | ICD-10-CM | POA: Diagnosis not present

## 2018-02-05 DIAGNOSIS — B1920 Unspecified viral hepatitis C without hepatic coma: Secondary | ICD-10-CM | POA: Diagnosis not present

## 2018-02-05 HISTORY — PX: PACEMAKER IMPLANT: EP1218

## 2018-02-05 LAB — CBC WITH DIFFERENTIAL/PLATELET
Basophils Absolute: 0 10*3/uL (ref 0.0–0.2)
Basos: 0 %
EOS (ABSOLUTE): 0.1 10*3/uL (ref 0.0–0.4)
Eos: 1 %
Hematocrit: 36.3 % (ref 34.0–46.6)
Hemoglobin: 11.9 g/dL (ref 11.1–15.9)
Immature Grans (Abs): 0 10*3/uL (ref 0.0–0.1)
Immature Granulocytes: 0 %
Lymphocytes Absolute: 1.7 10*3/uL (ref 0.7–3.1)
Lymphs: 34 %
MCH: 31.1 pg (ref 26.6–33.0)
MCHC: 32.8 g/dL (ref 31.5–35.7)
MCV: 95 fL (ref 79–97)
Monocytes Absolute: 0.5 10*3/uL (ref 0.1–0.9)
Monocytes: 10 %
Neutrophils Absolute: 2.7 10*3/uL (ref 1.4–7.0)
Neutrophils: 55 %
Platelets: 275 10*3/uL (ref 150–379)
RBC: 3.83 x10E6/uL (ref 3.77–5.28)
RDW: 14.8 % (ref 12.3–15.4)
WBC: 5 10*3/uL (ref 3.4–10.8)

## 2018-02-05 LAB — BASIC METABOLIC PANEL
BUN/Creatinine Ratio: 19 (ref 12–28)
BUN: 17 mg/dL (ref 8–27)
CO2: 26 mmol/L (ref 20–29)
Calcium: 9.6 mg/dL (ref 8.7–10.3)
Chloride: 102 mmol/L (ref 96–106)
Creatinine, Ser: 0.9 mg/dL (ref 0.57–1.00)
GFR calc Af Amer: 73 mL/min/{1.73_m2} (ref >59)
GFR calc non Af Amer: 64 mL/min/{1.73_m2} (ref >59)
Glucose: 98 mg/dL (ref 65–99)
Potassium: 4.5 mmol/L (ref 3.5–5.2)
Sodium: 139 mmol/L (ref 134–144)

## 2018-02-05 LAB — SURGICAL PCR SCREEN
MRSA, PCR: NEGATIVE
Staphylococcus aureus: NEGATIVE

## 2018-02-05 SURGERY — PACEMAKER IMPLANT

## 2018-02-05 MED ORDER — MIDAZOLAM HCL 5 MG/5ML IJ SOLN
INTRAMUSCULAR | Status: AC
  Filled 2018-02-05: qty 5

## 2018-02-05 MED ORDER — SODIUM CHLORIDE 0.9 % IR SOLN
Status: AC
  Filled 2018-02-05: qty 2

## 2018-02-05 MED ORDER — HEPARIN (PORCINE) IN NACL 2-0.9 UNIT/ML-% IJ SOLN
INTRAMUSCULAR | Status: AC
  Filled 2018-02-05: qty 500

## 2018-02-05 MED ORDER — VANCOMYCIN HCL IN DEXTROSE 1-5 GM/200ML-% IV SOLN
1000.0000 mg | Freq: Two times a day (BID) | INTRAVENOUS | Status: AC
  Administered 2018-02-06: 1000 mg via INTRAVENOUS
  Filled 2018-02-05: qty 200

## 2018-02-05 MED ORDER — VANCOMYCIN HCL IN DEXTROSE 1-5 GM/200ML-% IV SOLN
1000.0000 mg | INTRAVENOUS | Status: AC
  Administered 2018-02-05: 1000 mg via INTRAVENOUS
  Filled 2018-02-05: qty 200

## 2018-02-05 MED ORDER — FAMOTIDINE 20 MG PO TABS
20.0000 mg | ORAL_TABLET | Freq: Two times a day (BID) | ORAL | Status: DC
  Administered 2018-02-05 – 2018-02-06 (×2): 20 mg via ORAL
  Filled 2018-02-05 (×2): qty 1

## 2018-02-05 MED ORDER — IOPAMIDOL (ISOVUE-370) INJECTION 76%
INTRAVENOUS | Status: DC | PRN
  Administered 2018-02-05: 15 mL via INTRAVENOUS

## 2018-02-05 MED ORDER — SODIUM CHLORIDE 0.9 % IV SOLN
INTRAVENOUS | Status: DC
  Administered 2018-02-05: 15:00:00 via INTRAVENOUS

## 2018-02-05 MED ORDER — CYCLOBENZAPRINE HCL 10 MG PO TABS: 5.0000 mg | ORAL_TABLET | Freq: Three times a day (TID) | ORAL | Status: DC | PRN

## 2018-02-05 MED ORDER — VANCOMYCIN HCL IN DEXTROSE 1-5 GM/200ML-% IV SOLN
INTRAVENOUS | Status: AC
  Filled 2018-02-05: qty 200

## 2018-02-05 MED ORDER — GABAPENTIN 300 MG PO CAPS
600.0000 mg | ORAL_CAPSULE | Freq: Three times a day (TID) | ORAL | Status: DC
  Administered 2018-02-05 – 2018-02-06 (×2): 600 mg via ORAL
  Filled 2018-02-05 (×3): qty 2

## 2018-02-05 MED ORDER — ACETAMINOPHEN 325 MG PO TABS: 325.0000 mg | ORAL_TABLET | ORAL | Status: DC | PRN

## 2018-02-05 MED ORDER — SODIUM CHLORIDE 0.9% FLUSH: 3.0000 mL | INTRAVENOUS | Status: DC | PRN

## 2018-02-05 MED ORDER — SPIRONOLACTONE 25 MG PO TABS
25.0000 mg | ORAL_TABLET | Freq: Every day | ORAL | Status: DC
  Administered 2018-02-05 – 2018-02-06 (×2): 25 mg via ORAL
  Filled 2018-02-05 (×2): qty 1

## 2018-02-05 MED ORDER — LIDOCAINE HCL (PF) 1 % IJ SOLN
INTRAMUSCULAR | Status: DC | PRN
Start: 2018-02-05 — End: 2018-02-05
  Administered 2018-02-05: 60 mL

## 2018-02-05 MED ORDER — MIDAZOLAM HCL 5 MG/5ML IJ SOLN
INTRAMUSCULAR | Status: DC | PRN
  Administered 2018-02-05 (×4): 1 mg via INTRAVENOUS

## 2018-02-05 MED ORDER — FENTANYL CITRATE (PF) 100 MCG/2ML IJ SOLN
INTRAMUSCULAR | Status: DC | PRN
  Administered 2018-02-05: 25 ug via INTRAVENOUS
  Administered 2018-02-05 (×2): 12.5 ug via INTRAVENOUS

## 2018-02-05 MED ORDER — MUPIROCIN 2 % EX OINT
TOPICAL_OINTMENT | CUTANEOUS | Status: AC
  Administered 2018-02-05: 1 via TOPICAL
  Filled 2018-02-05: qty 22

## 2018-02-05 MED ORDER — IOPAMIDOL (ISOVUE-370) INJECTION 76%
INTRAVENOUS | Status: AC
  Filled 2018-02-05: qty 50

## 2018-02-05 MED ORDER — CHLORHEXIDINE GLUCONATE 4 % EX LIQD
60.0000 mL | Freq: Once | CUTANEOUS | Status: DC
  Filled 2018-02-05: qty 60

## 2018-02-05 MED ORDER — HEPARIN (PORCINE) IN NACL 2-0.9 UNIT/ML-% IJ SOLN
INTRAMUSCULAR | Status: AC | PRN
  Administered 2018-02-05: 500 mL

## 2018-02-05 MED ORDER — FERROUS SULFATE 325 (65 FE) MG PO TABS
325.0000 mg | ORAL_TABLET | Freq: Every day | ORAL | Status: DC
  Administered 2018-02-06: 325 mg via ORAL
  Filled 2018-02-05: qty 1

## 2018-02-05 MED ORDER — ONDANSETRON HCL 4 MG/2ML IJ SOLN
4.0000 mg | Freq: Four times a day (QID) | INTRAMUSCULAR | Status: DC | PRN
  Administered 2018-02-06: 4 mg via INTRAVENOUS
  Filled 2018-02-05: qty 2

## 2018-02-05 MED ORDER — DIAZEPAM 2 MG PO TABS: 2.0000 mg | ORAL_TABLET | Freq: Four times a day (QID) | ORAL | Status: DC | PRN

## 2018-02-05 MED ORDER — PANTOPRAZOLE SODIUM 40 MG PO TBEC
40.0000 mg | DELAYED_RELEASE_TABLET | Freq: Every day | ORAL | Status: DC
  Administered 2018-02-05 – 2018-02-06 (×2): 40 mg via ORAL
  Filled 2018-02-05 (×2): qty 1

## 2018-02-05 MED ORDER — DOLUTEGRAVIR SODIUM 50 MG PO TABS
50.0000 mg | ORAL_TABLET | Freq: Every day | ORAL | Status: DC
  Administered 2018-02-05 – 2018-02-06 (×2): 50 mg via ORAL
  Filled 2018-02-05 (×3): qty 1

## 2018-02-05 MED ORDER — LISINOPRIL 2.5 MG PO TABS
2.5000 mg | ORAL_TABLET | Freq: Every day | ORAL | Status: DC
  Administered 2018-02-05 – 2018-02-06 (×2): 2.5 mg via ORAL
  Filled 2018-02-05 (×2): qty 1

## 2018-02-05 MED ORDER — AMLODIPINE BESYLATE 10 MG PO TABS
10.0000 mg | ORAL_TABLET | Freq: Every day | ORAL | Status: DC
  Administered 2018-02-05 – 2018-02-06 (×2): 10 mg via ORAL
  Filled 2018-02-05 (×2): qty 1

## 2018-02-05 MED ORDER — LIDOCAINE HCL 1 % IJ SOLN
INTRAMUSCULAR | Status: AC
  Filled 2018-02-05: qty 60

## 2018-02-05 MED ORDER — HYDROCODONE-ACETAMINOPHEN 5-325 MG PO TABS
1.0000 | ORAL_TABLET | ORAL | Status: DC | PRN
  Administered 2018-02-05 – 2018-02-06 (×3): 2 via ORAL
  Filled 2018-02-05 (×3): qty 2

## 2018-02-05 MED ORDER — SODIUM CHLORIDE 0.9% FLUSH
3.0000 mL | Freq: Two times a day (BID) | INTRAVENOUS | Status: DC
  Administered 2018-02-05: 3 mL via INTRAVENOUS

## 2018-02-05 MED ORDER — SODIUM CHLORIDE 0.9 % IR SOLN
80.0000 mg | Status: AC
  Administered 2018-02-05: 80 mg
  Filled 2018-02-05: qty 2

## 2018-02-05 MED ORDER — SODIUM CHLORIDE 0.9 % IV SOLN: 250.0000 mL | INTRAVENOUS | Status: DC | PRN

## 2018-02-05 MED ORDER — ISOSORBIDE MONONITRATE ER 60 MG PO TB24
60.0000 mg | ORAL_TABLET | Freq: Every day | ORAL | Status: DC
  Administered 2018-02-05 – 2018-02-06 (×2): 60 mg via ORAL
  Filled 2018-02-05 (×2): qty 1

## 2018-02-05 MED ORDER — FENTANYL CITRATE (PF) 100 MCG/2ML IJ SOLN
INTRAMUSCULAR | Status: AC
  Filled 2018-02-05: qty 2

## 2018-02-05 MED ORDER — MUPIROCIN 2 % EX OINT
1.0000 "application " | TOPICAL_OINTMENT | Freq: Once | CUTANEOUS | Status: AC
  Administered 2018-02-05: 1 via TOPICAL

## 2018-02-05 MED ORDER — EMTRICITABINE-TENOFOVIR AF 200-25 MG PO TABS
1.0000 | ORAL_TABLET | Freq: Every day | ORAL | Status: DC
  Administered 2018-02-05 – 2018-02-06 (×2): 1 via ORAL
  Filled 2018-02-05 (×3): qty 1

## 2018-02-05 MED ORDER — CHLORTHALIDONE 25 MG PO TABS
25.0000 mg | ORAL_TABLET | Freq: Every day | ORAL | Status: DC
  Administered 2018-02-05 – 2018-02-06 (×2): 25 mg via ORAL
  Filled 2018-02-05 (×4): qty 1

## 2018-02-05 SURGICAL SUPPLY — 7 items
CABLE SURGICAL S-101-97-12 (CABLE) ×2 IMPLANT
LEAD TENDRIL MRI 46CM LPA1200M (Lead) ×2 IMPLANT
LEAD TENDRIL MRI 58CM LPA1200M (Lead) ×2 IMPLANT
PACEMAKER ASSURITY DR-RF (Pacemaker) ×2 IMPLANT
PAD DEFIB LIFELINK (PAD) ×2 IMPLANT
SHEATH CLASSIC 8F (SHEATH) ×4 IMPLANT
TRAY PACEMAKER INSERTION (PACKS) ×2 IMPLANT

## 2018-02-05 NOTE — Interval H&P Note (Signed)
History and Physical Interval Note:  02/05/2018 2:58 PM  Nicole Mccormick  has presented today for surgery, with the diagnosis of sss  The various methods of treatment have been discussed with the patient and family. After consideration of risks, benefits and other options for treatment, the patient has consented to  Procedure(s): PACEMAKER IMPLANT (N/A) as a surgical intervention .  The patient's history has been reviewed, patient examined, no change in status, stable for surgery.  I have reviewed the patient's chart and labs.  Questions were answered to the patient's satisfaction.     Thompson Grayer

## 2018-02-05 NOTE — Discharge Instructions (Signed)
° ° °  Supplemental Discharge Instructions for  Pacemaker/Defibrillator Patients  Activity No heavy lifting or vigorous activity with your left/right arm for 6 to 8 weeks.  Do not raise your left/right arm above your head for one week.  Gradually raise your affected arm as drawn below.           __     02/08/18                     02/09/18                     02/10/18                    02/11/18  NO DRIVING for  1 week   ; you may begin driving on  02/15/75   .  WOUND CARE - Keep the wound area clean and dry.  Do not get this area wet for one week. No showers for one week; you may shower on  02/11/18  . - The tape/steri-strips on your wound will fall off; do not pull them off.  No bandage is needed on the site.  DO  NOT apply any creams, oils, or ointments to the wound area. - If you notice any drainage or discharge from the wound, any swelling or bruising at the site, or you develop a fever > 101? F after you are discharged home, call the office at once.  Special Instructions - You are still able to use cellular telephones; use the ear opposite the side where you have your pacemaker/defibrillator.  Avoid carrying your cellular phone near your device. - When traveling through airports, show security personnel your identification card to avoid being screened in the metal detectors.  Ask the security personnel to use the hand wand. - Avoid arc welding equipment, TENS units (transcutaneous nerve stimulators).  Call the office for questions about other devices. - Avoid electrical appliances that are in poor condition or are not properly grounded. - Microwave ovens are safe to be near or to operate.

## 2018-02-05 NOTE — Discharge Summary (Addendum)
ELECTROPHYSIOLOGY PROCEDURE DISCHARGE SUMMARY    Patient ID: Nicole Mccormick,  MRN: [id number], DOB/AGE: 06-30-1946 72 y.o.  Admit date: 02/05/2018 Discharge date: 02/06/2018  Primary Care Physician: Campbell Riches, MD Primary Cardiologist: Meda Coffee Electrophysiologist: Edge Mauger  Primary Discharge Diagnosis:  Symptomatic bradycardia status post pacemaker implantation this admission  Secondary Discharge Diagnosis:  1.  HTN 2.  HIV 3.  Hepatitis C 4.  Tobacco abuse   Allergies  Allergen Reactions  . Penicillins     REACTION: GOES INTO SHOCK & THEN PASSES OU Has patient had a PCN reaction causing immediate rash, facial/tongue/throat swelling, SOB or lightheadedness with hypotension: NO Has patient had a PCN reaction causing severe rash involving mucus membranes or skin necrosis: NO Has patient had a PCN reaction that required hospitalization NO Has patient had a PCN reaction occurring within the last 10 years: NO If all of the above answers are "NO", then may proceed with Cephalosporin use.   . Avelox [Moxifloxacin Hcl In Nacl] Itching and Rash     Procedures This Admission:  1.  Implantation of a STJdual chamber PPM on 02/05/18 by Dr Rayann Heman.  The patient received a STJ model number Assurity PPM with model number [id number] right atrial lead and [id number] right ventricular lead. There were no immediate post procedure complications. 2.  CXR on 2/29/19 demonstrated no pneumothorax status post device implantation.   Brief HPI: Nicole Mccormick is a 72 y.o. female was referred to electrophysiology in the outpatient setting for consideration of PPM implantation.  The patient has had symptomatic bradycardia without reversible causes identified.  Risks, benefits, and alternatives to PPM implantation were reviewed with the patient who wished to proceed.   Hospital Course:  The patient was admitted and underwent implantation of a STJ dual chamber PPM with details as outlined above.  She  was  monitored on telemetry overnight which demonstrated atrial pacing with intrinsic ventricular conduction.  Left chest was without hematoma or ecchymosis.  The device was interrogated and found to be functioning normally.  CXR was obtained and demonstrated no pneumothorax status post device implantation.  Wound care, arm mobility, and restrictions were reviewed with the patient.  The patient was examined and considered stable for discharge to home.    Physical Exam: Vitals:   02/05/18 1704 02/05/18 1834 02/05/18 2108 02/06/18 0608  BP: (!) 210/92 (!) 175/88 (!) 149/79 (!) 167/83  Pulse: 67 69 64 64  Resp: 16 14 16 16   Temp: 98.7 F (37.1 C)  98.1 F (36.7 C) (!) 97.4 F (36.3 C)  TempSrc: Oral  Axillary Axillary  SpO2: 95%  92% 95%  Weight:    133 lb 1.6 oz (60.4 kg)  Height:        GEN- The patient is well appearing, alert and oriented x 3 today.   HEENT: normocephalic, atraumatic; sclera clear, conjunctiva pink; hearing intact; oropharynx clear; neck supple  Lungs- Clear to ausculation bilaterally, normal work of breathing.  No wheezes, rales, rhonchi Heart- Regular rate and rhythm  GI- soft, non-tender, non-distended, bowel sounds present  Extremities- no clubbing, cyanosis, or edema  MS- no significant deformity or atrophy Skin- warm and dry, no rash or lesion, left chest without hematoma/ecchymosis Psych- euthymic mood, full affect Neuro- strength and sensation are intact   Labs:   Lab Results  Component Value Date   WBC 5.0 02/04/2018   HGB 11.9 02/04/2018   HCT 36.3 02/04/2018   MCV 95 02/04/2018   PLT 275 02/04/2018  Recent Labs  Lab 02/06/18 0353  NA 134*  K 4.3  CL 101  CO2 24  BUN 17  CREATININE 1.00  CALCIUM 9.0  GLUCOSE 107*    Discharge Medications:  Allergies as of 02/06/2018      Reactions   Penicillins    REACTION: GOES INTO SHOCK & THEN PASSES OU Has patient had a PCN reaction causing immediate rash, facial/tongue/throat swelling, SOB or  lightheadedness with hypotension: NO Has patient had a PCN reaction causing severe rash involving mucus membranes or skin necrosis: NO Has patient had a PCN reaction that required hospitalization NO Has patient had a PCN reaction occurring within the last 10 years: NO If all of the above answers are "NO", then may proceed with Cephalosporin use.   Avelox [moxifloxacin Hcl In Nacl] Itching, Rash      Medication List    TAKE these medications   acyclovir 400 MG tablet Commonly known as:  ZOVIRAX Take 1 tablet (400 mg total) by mouth daily as needed (for flare ups).   albuterol 108 (90 Base) MCG/ACT inhaler Commonly known as:  PROAIR HFA inhale 2 puffs by mouth every 4 hours if needed for wheezing or shortness of breath   albuterol (2.5 MG/3ML) 0.083% nebulizer solution Commonly known as:  PROVENTIL Take 3 mLs (2.5 mg total) by nebulization every 6 (six) hours as needed for wheezing or shortness of breath.   amLODipine 10 MG tablet Commonly known as:  NORVASC Take 1 tablet (10 mg total) by mouth daily.   atorvastatin 40 MG tablet Commonly known as:  LIPITOR take 1 tablet by mouth once daily AT 6 PM   Cetirizine HCl 10 MG Tbdp Commonly known as:  ZYRTEC ALLERGY Take 1 tablet by mouth daily.   chlorthalidone 25 MG tablet Commonly known as:  HYGROTON Take 1 tablet (25 mg total) by mouth daily.   cholecalciferol 1000 units tablet Commonly known as:  VITAMIN D Take 1,000 Units by mouth daily.   cyclobenzaprine 5 MG tablet Commonly known as:  FLEXERIL take 1 tablet by mouth two times a day if needed for muscle spasm   diazepam 2 MG tablet Commonly known as:  VALIUM take 1 tablet by mouth every 6 hours if needed for anxiety   dolutegravir 50 MG tablet Commonly known as:  TIVICAY Take 1 tablet (50 mg total) by mouth daily.   emtricitabine-tenofovir AF 200-25 MG tablet Commonly known as:  DESCOVY Take 1 tablet by mouth daily.   famotidine 20 MG tablet Commonly known  as:  PEPCID Take 1 tablet (20 mg total) by mouth 2 (two) times daily.   ferrous sulfate 325 (65 FE) MG EC tablet Take 325 mg by mouth daily.   Fish Oil 1000 MG Cpdr Take 1,000 mg by mouth daily.   Flax Seed Oil 1000 MG Caps Take 1,000 mg by mouth daily.   gabapentin 600 MG tablet Commonly known as:  NEURONTIN take 1 tablet by mouth three times a day   guaiFENesin 100 MG/5ML Soln Commonly known as:  ROBITUSSIN Take 5 mLs (100 mg total) by mouth every 4 (four) hours as needed for cough or to loosen phlegm.   HYDROcodone-acetaminophen 5-325 MG tablet Commonly known as:  NORCO Take 1 tablet by mouth every 6 (six) hours as needed for moderate pain.   hydroxypropyl methylcellulose / hypromellose 2.5 % ophthalmic solution Commonly known as:  ISOPTO TEARS / GONIOVISC Place 1 drop into both eyes as needed for dry eyes.   isosorbide  mononitrate 60 MG 24 hr tablet Commonly known as:  IMDUR Take 1 tablet (60 mg total) by mouth daily.   lisinopril 2.5 MG tablet Commonly known as:  PRINIVIL,ZESTRIL Take 1 tablet (2.5 mg total) by mouth daily.   ondansetron 4 MG disintegrating tablet Commonly known as:  ZOFRAN-ODT Take 1 tablet (4 mg total) by mouth daily as needed for nausea or vomiting.   pantoprazole 40 MG tablet Commonly known as:  PROTONIX Take 1 tablet (40 mg total) by mouth daily.   RA ASPIRIN EC ADULT LOW ST 81 MG EC tablet Generic drug:  aspirin Take 1 tablet (81 mg total) by mouth daily.   spironolactone 25 MG tablet Commonly known as:  ALDACTONE Take 1 tablet (25 mg total) by mouth daily.   triamcinolone cream 0.1 % Commonly known as:  KENALOG Apply 1 application topically 2 (two) times daily.   vitamin A 10000 UNIT capsule Take 10,000 Units by mouth daily.   vitamin B-12 1000 MCG tablet Commonly known as:  CYANOCOBALAMIN Take 1,000 mcg by mouth daily.   vitamin C 1000 MG tablet Take 1,000 mg by mouth daily.       Disposition:  Discharge Instructions     Diet - low sodium heart healthy   Complete by:  As directed    Increase activity slowly   Complete by:  As directed      Follow-up Information    [facility] Follow up on 02/20/2018.   Specialty:  Cardiology Why:  at [facility] information: [address] [place] [phone]       Thompson Grayer, MD Follow up on 05/06/2018.   Specialty:  Cardiology Why:  at [facility] information: [place] [address] [phone]           Duration of Discharge Encounter: Greater than 30 minutes including physician time.  Signed, Chanetta Marshall, NP 02/06/2018 10:39 AM  Thompson Grayer MD, [facility]

## 2018-02-06 ENCOUNTER — Ambulatory Visit (HOSPITAL_COMMUNITY): Payer: Medicare Other

## 2018-02-06 ENCOUNTER — Encounter (HOSPITAL_COMMUNITY): Payer: Self-pay | Admitting: Internal Medicine

## 2018-02-06 ENCOUNTER — Encounter: Payer: Self-pay | Admitting: Nurse Practitioner

## 2018-02-06 DIAGNOSIS — Z950 Presence of cardiac pacemaker: Secondary | ICD-10-CM | POA: Diagnosis not present

## 2018-02-06 DIAGNOSIS — B1920 Unspecified viral hepatitis C without hepatic coma: Secondary | ICD-10-CM | POA: Diagnosis not present

## 2018-02-06 DIAGNOSIS — I2510 Atherosclerotic heart disease of native coronary artery without angina pectoris: Secondary | ICD-10-CM | POA: Diagnosis not present

## 2018-02-06 DIAGNOSIS — J9811 Atelectasis: Secondary | ICD-10-CM | POA: Diagnosis not present

## 2018-02-06 DIAGNOSIS — Z79899 Other long term (current) drug therapy: Secondary | ICD-10-CM | POA: Diagnosis not present

## 2018-02-06 DIAGNOSIS — I110 Hypertensive heart disease with heart failure: Secondary | ICD-10-CM | POA: Diagnosis not present

## 2018-02-06 DIAGNOSIS — I495 Sick sinus syndrome: Secondary | ICD-10-CM | POA: Diagnosis not present

## 2018-02-06 DIAGNOSIS — I509 Heart failure, unspecified: Secondary | ICD-10-CM | POA: Diagnosis not present

## 2018-02-06 DIAGNOSIS — Z7982 Long term (current) use of aspirin: Secondary | ICD-10-CM | POA: Diagnosis not present

## 2018-02-06 DIAGNOSIS — J45909 Unspecified asthma, uncomplicated: Secondary | ICD-10-CM | POA: Diagnosis not present

## 2018-02-06 DIAGNOSIS — Z880 Allergy status to penicillin: Secondary | ICD-10-CM | POA: Diagnosis not present

## 2018-02-06 LAB — BASIC METABOLIC PANEL
Anion gap: 9 (ref 5–15)
BUN: 17 mg/dL (ref 6–20)
CO2: 24 mmol/L (ref 22–32)
Calcium: 9 mg/dL (ref 8.9–10.3)
Chloride: 101 mmol/L (ref 101–111)
Creatinine, Ser: 1 mg/dL (ref 0.44–1.00)
GFR calc Af Amer: >60 mL/min (ref >60)
GFR calc non Af Amer: 55 mL/min — ABNORMAL LOW (ref >60)
Glucose, Bld: 107 mg/dL — ABNORMAL HIGH (ref 65–99)
Potassium: 4.3 mmol/L (ref 3.5–5.1)
Sodium: 134 mmol/L — ABNORMAL LOW (ref 135–145)

## 2018-02-06 MED ORDER — ONDANSETRON 4 MG PO TBDP
4.0000 mg | ORAL_TABLET | Freq: Once | ORAL | Status: DC
  Filled 2018-02-06: qty 1

## 2018-02-06 MED ORDER — HYDROCODONE-ACETAMINOPHEN 5-325 MG PO TABS: 1.0000 | ORAL_TABLET | Freq: Four times a day (QID) | ORAL | 0 refills | Status: DC | PRN

## 2018-02-06 MED ORDER — ONDANSETRON 4 MG PO TBDP: 4.0000 mg | ORAL_TABLET | Freq: Once | ORAL | 0 refills | Status: AC

## 2018-02-06 MED FILL — Lidocaine HCl Local Inj 1%: INTRAMUSCULAR | Qty: 60 | Status: AC

## 2018-02-20 ENCOUNTER — Ambulatory Visit (INDEPENDENT_AMBULATORY_CARE_PROVIDER_SITE_OTHER): Payer: Medicare Other | Admitting: *Deleted

## 2018-02-20 DIAGNOSIS — I495 Sick sinus syndrome: Secondary | ICD-10-CM

## 2018-02-23 LAB — CUP PACEART INCLINIC DEVICE CHECK
Battery Remaining Longevity: 124 mo
Battery Voltage: 3.1 V
Brady Statistic RA Percent Paced: 94 %
Brady Statistic RV Percent Paced: 1.3 %
Date Time Interrogation Session: 20190315170757
Implantable Lead Implant Date: 20190228
Implantable Lead Implant Date: 20190228
Implantable Lead Location: 753859
Implantable Lead Location: 753860
Implantable Pulse Generator Implant Date: 20190228
Lead Channel Impedance Value: 412.5 Ohm
Lead Channel Impedance Value: 575 Ohm
Lead Channel Pacing Threshold Amplitude: 0.5 V
Lead Channel Pacing Threshold Amplitude: 0.5 V
Lead Channel Pacing Threshold Amplitude: 0.5 V
Lead Channel Pacing Threshold Amplitude: 0.5 V
Lead Channel Pacing Threshold Pulse Width: 0.5 ms
Lead Channel Pacing Threshold Pulse Width: 0.5 ms
Lead Channel Pacing Threshold Pulse Width: 0.5 ms
Lead Channel Pacing Threshold Pulse Width: 0.5 ms
Lead Channel Sensing Intrinsic Amplitude: 10.3 mV
Lead Channel Sensing Intrinsic Amplitude: 4.6 mV
Lead Channel Setting Pacing Amplitude: 0.75 V
Lead Channel Setting Pacing Amplitude: 1.375
Lead Channel Setting Pacing Pulse Width: 0.5 ms
Lead Channel Setting Sensing Sensitivity: 2 mV
Pulse Gen Model: 2272
Pulse Gen Serial Number: 8996997

## 2018-02-23 NOTE — Progress Notes (Signed)
Wound check appointment. Steri-strips removed. Wound without redness or edema. Incision edges approximated, wound well healed. Normal device function. Thresholds, sensing, and impedances consistent with implant measurements. Device programmed at auto capture. Histogram distribution appropriate for patient and level of activity. No mode switches or high ventricular rates noted. Patient educated about wound care, arm mobility, lifting restrictions. ROV with JA 5/29

## 2018-04-16 ENCOUNTER — Ambulatory Visit: Payer: Medicare Other | Admitting: Cardiology

## 2018-04-29 ENCOUNTER — Other Ambulatory Visit: Payer: Self-pay | Admitting: Infectious Diseases

## 2018-04-29 DIAGNOSIS — J45909 Unspecified asthma, uncomplicated: Secondary | ICD-10-CM

## 2018-05-06 ENCOUNTER — Encounter: Payer: Medicare Other | Admitting: Internal Medicine

## 2018-05-19 ENCOUNTER — Other Ambulatory Visit: Payer: Self-pay | Admitting: Infectious Diseases

## 2018-05-19 DIAGNOSIS — R072 Precordial pain: Secondary | ICD-10-CM

## 2018-05-25 ENCOUNTER — Other Ambulatory Visit: Payer: Medicare Other

## 2018-05-27 ENCOUNTER — Encounter: Payer: Medicare Other | Admitting: Internal Medicine

## 2018-05-28 ENCOUNTER — Encounter: Payer: Self-pay | Admitting: Internal Medicine

## 2018-06-15 ENCOUNTER — Ambulatory Visit: Payer: Medicare Other | Admitting: Infectious Diseases

## 2018-06-29 ENCOUNTER — Other Ambulatory Visit: Payer: Self-pay | Admitting: Infectious Diseases

## 2018-06-29 ENCOUNTER — Other Ambulatory Visit: Payer: Self-pay

## 2018-06-29 DIAGNOSIS — R072 Precordial pain: Secondary | ICD-10-CM

## 2018-06-29 DIAGNOSIS — I10 Essential (primary) hypertension: Secondary | ICD-10-CM

## 2018-06-29 MED ORDER — AMLODIPINE BESYLATE 10 MG PO TABS: 10.0000 mg | ORAL_TABLET | Freq: Every day | ORAL | 0 refills | Status: DC

## 2018-07-06 ENCOUNTER — Encounter: Payer: Self-pay | Admitting: Infectious Diseases

## 2018-07-06 ENCOUNTER — Ambulatory Visit (INDEPENDENT_AMBULATORY_CARE_PROVIDER_SITE_OTHER): Payer: Medicare Other | Admitting: Infectious Diseases

## 2018-07-06 ENCOUNTER — Other Ambulatory Visit: Payer: Self-pay

## 2018-07-06 ENCOUNTER — Other Ambulatory Visit (HOSPITAL_COMMUNITY)
Admission: RE | Admit: 2018-07-06 | Discharge: 2018-07-06 | Disposition: A | Discharge status: Home / Self Care | Payer: Medicare Other | Source: Ambulatory Visit | Attending: Infectious Diseases | Admitting: Infectious Diseases

## 2018-07-06 VITALS — BP 164/86 | HR 60 | Temp 97.7°F | Ht 62.0 in | Wt 128.0 lb

## 2018-07-06 DIAGNOSIS — B20 Human immunodeficiency virus [HIV] disease: Secondary | ICD-10-CM | POA: Diagnosis not present

## 2018-07-06 DIAGNOSIS — Z23 Encounter for immunization: Secondary | ICD-10-CM

## 2018-07-06 DIAGNOSIS — I495 Sick sinus syndrome: Secondary | ICD-10-CM | POA: Diagnosis not present

## 2018-07-06 DIAGNOSIS — M25562 Pain in left knee: Secondary | ICD-10-CM

## 2018-07-06 DIAGNOSIS — Z113 Encounter for screening for infections with a predominantly sexual mode of transmission: Secondary | ICD-10-CM

## 2018-07-06 DIAGNOSIS — M25551 Pain in right hip: Secondary | ICD-10-CM

## 2018-07-06 DIAGNOSIS — R87622 Low grade squamous intraepithelial lesion on cytologic smear of vagina (LGSIL): Secondary | ICD-10-CM

## 2018-07-06 DIAGNOSIS — I10 Essential (primary) hypertension: Secondary | ICD-10-CM

## 2018-07-06 DIAGNOSIS — B182 Chronic viral hepatitis C: Secondary | ICD-10-CM

## 2018-07-06 DIAGNOSIS — Z79899 Other long term (current) drug therapy: Secondary | ICD-10-CM

## 2018-07-06 DIAGNOSIS — J45909 Unspecified asthma, uncomplicated: Secondary | ICD-10-CM

## 2018-07-06 DIAGNOSIS — M25552 Pain in left hip: Secondary | ICD-10-CM

## 2018-07-06 DIAGNOSIS — M25561 Pain in right knee: Secondary | ICD-10-CM

## 2018-07-06 DIAGNOSIS — R072 Precordial pain: Secondary | ICD-10-CM

## 2018-07-06 DIAGNOSIS — F17200 Nicotine dependence, unspecified, uncomplicated: Secondary | ICD-10-CM

## 2018-07-06 DIAGNOSIS — Z1239 Encounter for other screening for malignant neoplasm of breast: Secondary | ICD-10-CM

## 2018-07-06 DIAGNOSIS — G8929 Other chronic pain: Secondary | ICD-10-CM

## 2018-07-06 DIAGNOSIS — Z1231 Encounter for screening mammogram for malignant neoplasm of breast: Secondary | ICD-10-CM

## 2018-07-06 MED ORDER — LISINOPRIL 2.5 MG PO TABS: 2.5000 mg | ORAL_TABLET | Freq: Every day | ORAL | 3 refills | Status: DC

## 2018-07-06 MED ORDER — GABAPENTIN 600 MG PO TABS: 600.0000 mg | ORAL_TABLET | Freq: Three times a day (TID) | ORAL | 1 refills | Status: DC

## 2018-07-06 MED ORDER — ISOSORBIDE MONONITRATE ER 60 MG PO TB24
60.0000 mg | ORAL_TABLET | Freq: Every day | ORAL | 1 refills | Status: AC
Start: 2018-07-06 — End: ?

## 2018-07-06 MED ORDER — BICTEGRAVIR-EMTRICITAB-TENOFOV 50-200-25 MG PO TABS: 1.0000 | ORAL_TABLET | Freq: Every day | ORAL | 3 refills | Status: DC

## 2018-07-06 MED ORDER — CYCLOBENZAPRINE HCL 5 MG PO TABS: ORAL_TABLET | ORAL | 0 refills | Status: DC

## 2018-07-06 MED ORDER — ACYCLOVIR 400 MG PO TABS: 400.0000 mg | ORAL_TABLET | Freq: Every day | ORAL | 3 refills | Status: AC | PRN

## 2018-07-06 MED ORDER — ALBUTEROL SULFATE (2.5 MG/3ML) 0.083% IN NEBU: 2.5000 mg | INHALATION_SOLUTION | Freq: Four times a day (QID) | RESPIRATORY_TRACT | 3 refills | Status: DC | PRN

## 2018-07-06 MED ORDER — AMLODIPINE BESYLATE 10 MG PO TABS: 10.0000 mg | ORAL_TABLET | Freq: Every day | ORAL | 0 refills | Status: DC

## 2018-07-06 NOTE — Assessment & Plan Note (Signed)
Encouraged to quit. 

## 2018-07-06 NOTE — Assessment & Plan Note (Signed)
She is out of one of her medicaitons, will refill.

## 2018-07-06 NOTE — Progress Notes (Signed)
   Subjective:    Patient ID: Nicole Mccormick, female    DOB: 01-10-1946, 72 y.o.   MRN: [id number]  HPI 72 yo F with hx of HTN and HIV+ (on tivicay-descovy). She is also long term smoker.  She had episodes of bradycardia over the winter and had pacer placed 02-05-18. She feels like her energy is better.  No problems with medications. Has not been off meds.  Continue to smoke.   HIV 1 RNA Quant (copies/mL)  Date Value  11/10/2017 <20 NOT DETECTED  05/15/2017 <20 NOT DETECTED  08/21/2016 <20   CD4 T Cell Abs (/uL)  Date Value  11/10/2017 520  05/15/2017 480  08/21/2016 490    Review of Systems  Constitutional: Positive for appetite change and unexpected weight change.  Respiratory: Negative for cough and shortness of breath.   Gastrointestinal: Negative for constipation and diarrhea.  Genitourinary: Negative for difficulty urinating.  Musculoskeletal: Positive for arthralgias.  Psychiatric/Behavioral: Negative for dysphoric mood.  morning stiffnes- took asa without relief. Out of flexeril.      Objective:   Physical Exam  Constitutional: She is oriented to person, place, and time. She appears well-developed and well-nourished.  HENT:  Mouth/Throat: No oropharyngeal exudate.  Eyes: Pupils are equal, round, and reactive to light. EOM are normal.  Neck: Normal range of motion. Neck supple.  Cardiovascular: Normal rate, regular rhythm and normal heart sounds.  Pulmonary/Chest: Effort normal and breath sounds normal.  Abdominal: Soft. Bowel sounds are normal. She exhibits no distension. There is no tenderness.  Musculoskeletal: Normal range of motion. She exhibits no edema.  Lymphadenopathy:    She has no cervical adenopathy.  Neurological: She is alert and oriented to person, place, and time.  Psychiatric: She has a normal mood and affect.       Assessment & Plan:

## 2018-07-06 NOTE — Assessment & Plan Note (Signed)
She defers repeat CT scan.

## 2018-07-06 NOTE — Assessment & Plan Note (Signed)
Worse in the Am Would like to see rheum Has failed tyelonl, motrin.Marland Kitchen

## 2018-07-06 NOTE — Assessment & Plan Note (Signed)
Will give her PCV 13 Will change her ART to biktarvy.  Offered/refused condoms.  Will see her back in 6 months.

## 2018-07-06 NOTE — Assessment & Plan Note (Signed)
Pacer site is clean, non-tender.

## 2018-07-07 LAB — COMPREHENSIVE METABOLIC PANEL
AG Ratio: 1.2 (calc) (ref 1.0–2.5)
ALT: 8 U/L (ref 6–29)
AST: 15 U/L (ref 10–35)
Albumin: 4.1 g/dL (ref 3.6–5.1)
Alkaline phosphatase (APISO): 75 U/L (ref 33–130)
BUN: 24 mg/dL (ref 7–25)
CO2: 26 mmol/L (ref 20–32)
Calcium: 9.2 mg/dL (ref 8.6–10.4)
Chloride: 106 mmol/L (ref 98–110)
Creat: 0.9 mg/dL (ref 0.60–0.93)
Globulin: 3.3 g/dL (calc) (ref 1.9–3.7)
Glucose, Bld: 105 mg/dL — ABNORMAL HIGH (ref 65–99)
Potassium: 4.5 mmol/L (ref 3.5–5.3)
Sodium: 139 mmol/L (ref 135–146)
Total Bilirubin: 0.5 mg/dL (ref 0.2–1.2)
Total Protein: 7.4 g/dL (ref 6.1–8.1)

## 2018-07-07 LAB — RPR: RPR Ser Ql: NONREACTIVE

## 2018-07-07 LAB — CBC
HCT: 39.5 % (ref 35.0–45.0)
Hemoglobin: 13.1 g/dL (ref 11.7–15.5)
MCH: 32.7 pg (ref 27.0–33.0)
MCHC: 33.2 g/dL (ref 32.0–36.0)
MCV: 98.5 fL (ref 80.0–100.0)
MPV: 9.8 fL (ref 7.5–12.5)
Platelets: 249 10*3/uL (ref 140–400)
RBC: 4.01 10*6/uL (ref 3.80–5.10)
RDW: 12.5 % (ref 11.0–15.0)
WBC: 5 10*3/uL (ref 3.8–10.8)

## 2018-07-07 LAB — T-HELPER CELL (CD4) - (RCID CLINIC ONLY)
CD4 % Helper T Cell: 23 % — ABNORMAL LOW (ref 33–55)
CD4 T Cell Abs: 400 /uL (ref 400–2700)

## 2018-07-08 LAB — URINE CYTOLOGY ANCILLARY ONLY
Chlamydia: NEGATIVE
Neisseria Gonorrhea: NEGATIVE

## 2018-07-11 LAB — HIV-1 RNA ULTRAQUANT REFLEX TO GENTYP+
HIV 1 RNA Quant: <20 copies/mL
HIV-1 RNA Quant, Log: <1.3 Log copies/mL

## 2018-08-12 ENCOUNTER — Ambulatory Visit: Payer: Medicare Other

## 2018-08-14 ENCOUNTER — Ambulatory Visit: Payer: Medicare Other | Admitting: Cardiology

## 2018-08-17 ENCOUNTER — Encounter: Payer: Self-pay | Admitting: Cardiology

## 2018-08-24 ENCOUNTER — Other Ambulatory Visit: Payer: Self-pay | Admitting: Infectious Diseases

## 2018-08-24 ENCOUNTER — Telehealth: Payer: Self-pay | Admitting: *Deleted

## 2018-08-24 DIAGNOSIS — M25561 Pain in right knee: Secondary | ICD-10-CM

## 2018-08-24 DIAGNOSIS — B20 Human immunodeficiency virus [HIV] disease: Secondary | ICD-10-CM

## 2018-08-24 DIAGNOSIS — G8929 Other chronic pain: Secondary | ICD-10-CM

## 2018-08-24 DIAGNOSIS — M25552 Pain in left hip: Secondary | ICD-10-CM

## 2018-08-24 DIAGNOSIS — M25562 Pain in left knee: Secondary | ICD-10-CM

## 2018-08-24 DIAGNOSIS — M25551 Pain in right hip: Secondary | ICD-10-CM

## 2018-08-24 NOTE — Telephone Encounter (Signed)
Patient is asking for refill of flexeril. In January 2019 visit, this was reduced due to age. She was given 30 day fill at her last visit with you 06/2018, was referred to rheumatology for follow up. This referral to rheumatology was denied. Please advise on flexeril refill. Landis Gandy, RN

## 2018-08-25 NOTE — Telephone Encounter (Signed)
Can you give her 30 days again please thanks

## 2018-08-25 NOTE — Telephone Encounter (Signed)
Done

## 2018-08-26 ENCOUNTER — Ambulatory Visit (INDEPENDENT_AMBULATORY_CARE_PROVIDER_SITE_OTHER): Payer: Medicare Other | Admitting: *Deleted

## 2018-08-26 DIAGNOSIS — R001 Bradycardia, unspecified: Secondary | ICD-10-CM | POA: Diagnosis not present

## 2018-08-26 NOTE — Progress Notes (Signed)
Remote pacemaker transmission.   

## 2018-08-29 ENCOUNTER — Other Ambulatory Visit: Payer: Self-pay | Admitting: Infectious Diseases

## 2018-08-29 DIAGNOSIS — M25562 Pain in left knee: Principal | ICD-10-CM

## 2018-08-29 DIAGNOSIS — M25561 Pain in right knee: Principal | ICD-10-CM

## 2018-08-29 DIAGNOSIS — M25552 Pain in left hip: Principal | ICD-10-CM

## 2018-08-29 DIAGNOSIS — M25551 Pain in right hip: Principal | ICD-10-CM

## 2018-08-29 DIAGNOSIS — G8929 Other chronic pain: Secondary | ICD-10-CM

## 2018-09-01 ENCOUNTER — Other Ambulatory Visit: Payer: Self-pay | Admitting: *Deleted

## 2018-09-01 DIAGNOSIS — G8929 Other chronic pain: Secondary | ICD-10-CM

## 2018-09-01 DIAGNOSIS — M25562 Pain in left knee: Principal | ICD-10-CM

## 2018-09-01 DIAGNOSIS — M25551 Pain in right hip: Principal | ICD-10-CM

## 2018-09-01 DIAGNOSIS — M25552 Pain in left hip: Principal | ICD-10-CM

## 2018-09-01 DIAGNOSIS — M25561 Pain in right knee: Principal | ICD-10-CM

## 2018-09-01 MED ORDER — GABAPENTIN 600 MG PO TABS: 600.0000 mg | ORAL_TABLET | Freq: Three times a day (TID) | ORAL | 1 refills | Status: DC

## 2018-09-28 LAB — CUP PACEART REMOTE DEVICE CHECK
Battery Remaining Longevity: 121 mo
Battery Remaining Percentage: 95.5 %
Battery Voltage: 3.02 V
Brady Statistic AP VP Percent: 1.4 %
Brady Statistic AP VS Percent: 89 %
Brady Statistic AS VP Percent: 1 %
Brady Statistic AS VS Percent: 10 %
Brady Statistic RA Percent Paced: 89 %
Brady Statistic RV Percent Paced: 1.4 %
Date Time Interrogation Session: 20190918060014
Implantable Lead Implant Date: 20190228
Implantable Lead Implant Date: 20190228
Implantable Lead Location: 753859
Implantable Lead Location: 753860
Implantable Pulse Generator Implant Date: 20190228
Lead Channel Impedance Value: 400 Ohm
Lead Channel Impedance Value: 530 Ohm
Lead Channel Pacing Threshold Amplitude: 0.375 V
Lead Channel Pacing Threshold Amplitude: 0.5 V
Lead Channel Pacing Threshold Pulse Width: 0.5 ms
Lead Channel Pacing Threshold Pulse Width: 0.5 ms
Lead Channel Sensing Intrinsic Amplitude: 10.9 mV
Lead Channel Sensing Intrinsic Amplitude: 2.9 mV
Lead Channel Setting Pacing Amplitude: 0.75 V
Lead Channel Setting Pacing Amplitude: 1.375
Lead Channel Setting Pacing Pulse Width: 0.5 ms
Lead Channel Setting Sensing Sensitivity: 2 mV
Pulse Gen Model: 2272
Pulse Gen Serial Number: 8996997

## 2018-10-04 ENCOUNTER — Other Ambulatory Visit: Payer: Self-pay | Admitting: Infectious Diseases

## 2018-10-04 DIAGNOSIS — I10 Essential (primary) hypertension: Secondary | ICD-10-CM

## 2018-10-12 ENCOUNTER — Other Ambulatory Visit: Payer: Self-pay | Admitting: Infectious Diseases

## 2018-10-12 DIAGNOSIS — I10 Essential (primary) hypertension: Secondary | ICD-10-CM

## 2018-11-03 ENCOUNTER — Other Ambulatory Visit: Payer: Self-pay | Admitting: Infectious Diseases

## 2018-11-03 DIAGNOSIS — M25562 Pain in left knee: Principal | ICD-10-CM

## 2018-11-03 DIAGNOSIS — G8929 Other chronic pain: Secondary | ICD-10-CM

## 2018-11-03 DIAGNOSIS — M25561 Pain in right knee: Principal | ICD-10-CM

## 2018-11-03 DIAGNOSIS — M25551 Pain in right hip: Principal | ICD-10-CM

## 2018-11-03 DIAGNOSIS — M25552 Pain in left hip: Principal | ICD-10-CM

## 2018-11-04 ENCOUNTER — Telehealth: Payer: Self-pay

## 2018-11-04 NOTE — Telephone Encounter (Signed)
Gabapentin called to pharmacy .   Laverle Patter, RN

## 2018-11-23 ENCOUNTER — Encounter: Payer: Medicare Other | Admitting: Internal Medicine

## 2018-11-25 ENCOUNTER — Ambulatory Visit (INDEPENDENT_AMBULATORY_CARE_PROVIDER_SITE_OTHER): Payer: Medicare Other

## 2018-11-25 DIAGNOSIS — I495 Sick sinus syndrome: Secondary | ICD-10-CM

## 2018-11-25 NOTE — Progress Notes (Signed)
Remote pacemaker transmission.   

## 2018-11-26 ENCOUNTER — Encounter: Payer: Self-pay | Admitting: Cardiology

## 2018-12-13 LAB — CUP PACEART REMOTE DEVICE CHECK
Battery Remaining Longevity: 118 mo
Battery Remaining Percentage: 95.5 %
Battery Voltage: 3.02 V
Brady Statistic AP VP Percent: 1.3 %
Brady Statistic AP VS Percent: 88 %
Brady Statistic AS VP Percent: 1 %
Brady Statistic AS VS Percent: 11 %
Brady Statistic RA Percent Paced: 88 %
Brady Statistic RV Percent Paced: 1.3 %
Date Time Interrogation Session: 20191218071535
Implantable Lead Implant Date: 20190228
Implantable Lead Implant Date: 20190228
Implantable Lead Location: 753859
Implantable Lead Location: 753860
Implantable Pulse Generator Implant Date: 20190228
Lead Channel Impedance Value: 390 Ohm
Lead Channel Impedance Value: 490 Ohm
Lead Channel Pacing Threshold Amplitude: 0.5 V
Lead Channel Pacing Threshold Amplitude: 0.625 V
Lead Channel Pacing Threshold Pulse Width: 0.5 ms
Lead Channel Pacing Threshold Pulse Width: 0.5 ms
Lead Channel Sensing Intrinsic Amplitude: 10.1 mV
Lead Channel Sensing Intrinsic Amplitude: 4.7 mV
Lead Channel Setting Pacing Amplitude: 0.875
Lead Channel Setting Pacing Amplitude: 1.5 V
Lead Channel Setting Pacing Pulse Width: 0.5 ms
Lead Channel Setting Sensing Sensitivity: 2 mV
Pulse Gen Model: 2272
Pulse Gen Serial Number: 8996997

## 2018-12-22 ENCOUNTER — Ambulatory Visit: Payer: Medicare Other | Admitting: Nurse Practitioner

## 2018-12-23 ENCOUNTER — Other Ambulatory Visit: Payer: Medicare Other

## 2018-12-23 ENCOUNTER — Other Ambulatory Visit: Payer: Self-pay | Admitting: Family

## 2018-12-23 DIAGNOSIS — Z79899 Other long term (current) drug therapy: Secondary | ICD-10-CM | POA: Diagnosis not present

## 2018-12-23 DIAGNOSIS — F17200 Nicotine dependence, unspecified, uncomplicated: Secondary | ICD-10-CM

## 2018-12-23 DIAGNOSIS — I110 Hypertensive heart disease with heart failure: Secondary | ICD-10-CM | POA: Diagnosis not present

## 2018-12-23 DIAGNOSIS — Z0189 Encounter for other specified special examinations: Secondary | ICD-10-CM | POA: Diagnosis not present

## 2018-12-23 DIAGNOSIS — D509 Iron deficiency anemia, unspecified: Secondary | ICD-10-CM | POA: Diagnosis not present

## 2018-12-29 ENCOUNTER — Ambulatory Visit
Admission: RE | Admit: 2018-12-29 | Discharge: 2018-12-29 | Disposition: A | Discharge status: Home / Self Care | Payer: Medicare Other | Source: Ambulatory Visit | Attending: Family | Admitting: Family

## 2018-12-29 DIAGNOSIS — F17200 Nicotine dependence, unspecified, uncomplicated: Secondary | ICD-10-CM

## 2019-01-05 ENCOUNTER — Other Ambulatory Visit: Payer: Self-pay | Admitting: Internal Medicine

## 2019-01-05 DIAGNOSIS — Z13820 Encounter for screening for osteoporosis: Secondary | ICD-10-CM

## 2019-01-06 ENCOUNTER — Encounter: Payer: Medicare Other | Admitting: Infectious Diseases

## 2019-01-06 ENCOUNTER — Other Ambulatory Visit: Payer: Self-pay | Admitting: Infectious Diseases

## 2019-01-06 DIAGNOSIS — J45909 Unspecified asthma, uncomplicated: Secondary | ICD-10-CM

## 2019-01-12 ENCOUNTER — Encounter: Payer: Medicare Other | Admitting: Infectious Diseases

## 2019-01-20 ENCOUNTER — Encounter: Payer: Self-pay | Admitting: Internal Medicine

## 2019-01-20 ENCOUNTER — Encounter: Payer: Medicare Other | Admitting: Internal Medicine

## 2019-01-20 ENCOUNTER — Ambulatory Visit (INDEPENDENT_AMBULATORY_CARE_PROVIDER_SITE_OTHER): Payer: Medicare Other | Admitting: Internal Medicine

## 2019-01-20 VITALS — BP 178/90 | HR 63 | Ht 62.0 in | Wt 123.0 lb

## 2019-01-20 DIAGNOSIS — R001 Bradycardia, unspecified: Secondary | ICD-10-CM | POA: Diagnosis not present

## 2019-01-20 DIAGNOSIS — I10 Essential (primary) hypertension: Secondary | ICD-10-CM

## 2019-01-20 DIAGNOSIS — I119 Hypertensive heart disease without heart failure: Secondary | ICD-10-CM | POA: Diagnosis not present

## 2019-01-20 DIAGNOSIS — Z950 Presence of cardiac pacemaker: Secondary | ICD-10-CM

## 2019-01-20 DIAGNOSIS — I701 Atherosclerosis of renal artery: Secondary | ICD-10-CM | POA: Diagnosis not present

## 2019-01-20 NOTE — Patient Instructions (Addendum)
Medication Instructions:  Your physician recommends that you continue on your current medications as directed. Please refer to the Current Medication list given to you today.  Labwork: None ordered.  Testing/Procedures: Your physician has requested that you have a renal artery duplex. During this test, an ultrasound is used to evaluate blood flow to the kidneys. Allow one hour for this exam. Do not eat after midnight the day before and avoid carbonated beverages. Take your medications as you usually do.  Please schedule for renal arterial duplex  Follow-Up:  Your physician wants you to follow-up in: one year with Nicole Marshall, NP.   You will receive a reminder letter in the mail two months in advance. If you don't receive a letter, please call our office to schedule the follow-up appointment.  Remote monitoring is used to monitor your Pacemaker from home. This monitoring reduces the number of office visits required to check your device to one time per year. It allows [place] to keep an eye on the functioning of your device to ensure it is working properly. You are scheduled for a device check from home on 02/24/2019. You may send your transmission at any time that day. If you have a wireless device, the transmission will be sent automatically. After your physician reviews your transmission, you will receive a postcard with your next transmission date.  Any Other Special Instructions Will Be Listed Below (If Applicable).  Referral placed to Dr. Oval Linsey to establish for hypertension  If you need a refill on your cardiac medications before your next appointment, please call your pharmacy.

## 2019-01-20 NOTE — Progress Notes (Signed)
PCP: Sonia Side., FNP   Primary EP:  Dr Rayann Heman  Nicole Mccormick is a 73 y.o. female who presents today for routine electrophysiology followup.  Since last being seen in our clinic, the patient reports doing very well.  Today, she denies symptoms of palpitations, chest pain, shortness of breath,  lower extremity edema, dizziness, presyncope, or syncope.  The patient is otherwise without complaint today.   Past Medical History:  Diagnosis Date  . Abnormal Pap smear   . Arthritis   . Asthma   . CHF (congestive heart failure) ([place])   . Complication of anesthesia    "they have a hard time bringing me back" (11/08/2015)  . Coronary artery disease   . GERD (gastroesophageal reflux disease)    previously on aciphex, discontinued november 2012  because patient asymptomatic, and concern about interference with HIV meds.  May try pepcid in the future if symptoms return  . Heart murmur   . Hepatitis C antibody test positive   . HIV infection ([place])    diagnosed before 2008  . Hypertension   . Iron deficiency anemia    Ferritin = 2 in november 2012, started on iron supplemenation  . Pleuritic chest pain 01/07/2018  . Seizures ([place])   . Varicosities    Past Surgical History:  Procedure Laterality Date  . ABDOMINAL HYSTERECTOMY    . CARDIAC CATHETERIZATION N/A 11/08/2015   Procedure: Left Heart Cath and Coronary Angiography;  Surgeon: Jettie Booze, MD;  Location: [facility];  Service: Cardiovascular;  Laterality: N/A;  . COLONOSCOPY  10/13/11   small adenoma, anal condyloma  . COLONOSCOPY  10/13/2011   Procedure: COLONOSCOPY;  Surgeon: Gatha Mayer, MD;  Location: [place];  Service: Endoscopy;  Laterality: N/A;  . COLONOSCOPY N/A 09/14/2014   Procedure: COLONOSCOPY;  Surgeon: Arta Silence, MD;  Location[facility] ENDOSCOPY;  Service: Endoscopy;  Laterality: N/A;  . ESOPHAGOGASTRODUODENOSCOPY  10/13/11   small hiatal hernia  . ESOPHAGOGASTRODUODENOSCOPY  10/13/2011   Procedure: ESOPHAGOGASTRODUODENOSCOPY (EGD);  Surgeon: Gatha Mayer, MD;  Location: [facility] ENDOSCOPY;  Service: Endoscopy;  Laterality: N/A;  . ESOPHAGOGASTRODUODENOSCOPY N/A 09/14/2014   Procedure: ESOPHAGOGASTRODUODENOSCOPY (EGD);  Surgeon: Arta Silence, MD;  Location[facility] ENDOSCOPY;  Service: Endoscopy;  Laterality: N/A;  . GIVENS CAPSULE STUDY N/A 09/14/2014   Procedure: GIVENS CAPSULE STUDY;  Surgeon: Arta Silence, MD;  Location: [facility] ENDOSCOPY;  Service: Endoscopy;  Laterality: N/A;  . PACEMAKER IMPLANT N/A 02/05/2018   Procedure: PACEMAKER IMPLANT;  Surgeon: Thompson Grayer, MD;  Location: [place];  Service: Cardiovascular;  Laterality: N/A;    ROS- all systems are reviewed and negative except as per HPI above  Current Outpatient Medications  Medication Sig Dispense Refill  . acyclovir (ZOVIRAX) 400 MG tablet Take 1 tablet (400 mg total) by mouth daily as needed (for flare ups). 30 tablet 3  . albuterol (PROAIR HFA) 108 (90 Base) MCG/ACT inhaler INHALE 2 PUFFS BY MOUTH EVERY 4 HOURS IF NEEDED FOR WHEEZING OR SHORTNESS OF BREATH 8.5 g 0  . albuterol (PROVENTIL) (2.5 MG/3ML) 0.083% nebulizer solution Take 3 mLs (2.5 mg total) by nebulization every 6 (six) hours as needed for wheezing or shortness of breath. 75 mL 3  . amLODipine (NORVASC) 10 MG tablet TAKE 1 TABLET BY MOUTH EVERY DAY 90 tablet 0  . Ascorbic Acid (VITAMIN C) 1000 MG tablet Take 1,000 mg by mouth daily.    Marland Kitchen atorvastatin (LIPITOR) 40 MG tablet take 1 tablet  by mouth once daily AT 6 PM 90 tablet 0  . bictegravir-emtricitabine-tenofovir AF (BIKTARVY) 50-200-25 MG TABS tablet Take 1 tablet by mouth daily. 90 tablet 3  . Cetirizine HCl (ZYRTEC ALLERGY) 10 MG TBDP Take 1 tablet by mouth daily. 90 tablet 3  . cholecalciferol (VITAMIN D) 1000 units tablet Take 1,000 Units by mouth daily.    . cyclobenzaprine (FLEXERIL) 5 MG tablet TAKE 1 TABLET BY MOUTH TWICE DAILY AS NEEDED FOR MUSCLE SPASM 30 tablet 0  . diazepam (VALIUM) 2  MG tablet take 1 tablet by mouth every 6 hours if needed for anxiety 30 tablet 0  . escitalopram (LEXAPRO) 10 MG tablet Take 1 tablet by mouth daily as needed for anxiety.    . famotidine (PEPCID) 20 MG tablet Take 1 tablet (20 mg total) by mouth 2 (two) times daily. 60 tablet 3  . ferrous sulfate 325 (65 FE) MG EC tablet Take 325 mg by mouth daily.    . Flaxseed, Linseed, (FLAX SEED OIL) 1000 MG CAPS Take 1,000 mg by mouth daily.    Marland Kitchen gabapentin (NEURONTIN) 600 MG tablet TAKE 1 TABLET BY MOUTH THREE TIMES DAILY 90 tablet 4  . HYDROcodone-acetaminophen (NORCO) 5-325 MG tablet Take 1 tablet by mouth every 6 (six) hours as needed for moderate pain. 6 tablet 0  . isosorbide mononitrate (IMDUR) 60 MG 24 hr tablet Take 1 tablet (60 mg total) by mouth daily. 90 tablet 1  . lisinopril (PRINIVIL,ZESTRIL) 5 MG tablet Take 2 tablets by mouth daily.    . Omega-3 Fatty Acids (FISH OIL) 1000 MG CPDR Take 1,000 mg by mouth daily.    . ondansetron (ZOFRAN-ODT) 4 MG disintegrating tablet Take 1 tablet (4 mg total) by mouth daily as needed for nausea or vomiting. 20 tablet 3  . pantoprazole (PROTONIX) 40 MG tablet Take 1 tablet (40 mg total) by mouth daily. 90 tablet 0  . RA ASPIRIN EC ADULT LOW ST 81 MG EC tablet Take 1 tablet (81 mg total) by mouth daily. 30 tablet 5  . spironolactone (ALDACTONE) 25 MG tablet TAKE 1 TABLET BY MOUTH EVERY DAY 30 tablet 0  . triamcinolone cream (KENALOG) 0.1 % Apply 1 application topically 2 (two) times daily. 30 g 0  . vitamin A 10000 UNIT capsule Take 10,000 Units by mouth daily.    . vitamin B-12 (CYANOCOBALAMIN) 1000 MCG tablet Take 1,000 mcg by mouth daily.    Marland Kitchen lisinopril (PRINIVIL,ZESTRIL) 2.5 MG tablet Take 1 tablet (2.5 mg total) by mouth daily. 90 tablet 3   No current facility-administered medications for this visit.     Physical Exam: Vitals:   01/20/19 1212  BP: (!) 178/90  Pulse: 63  SpO2: 96%  Weight: 123 lb (55.8 kg)  Height: 5\' 2"  (1.575 m)    GEN-  The patient is well appearing, alert and oriented x 3 today.   Head- normocephalic, atraumatic Eyes-  Sclera clear, conjunctiva pink Ears- hearing intact Oropharynx- clear Lungs- Clear to ausculation bilaterally, normal work of breathing Chest- pacemaker pocket is well healed Heart- Regular rate and rhythm, no murmurs, rubs or gallops, PMI not laterally displaced GI- soft, NT, ND, + BS Extremities- no clubbing, cyanosis, or edema  Pacemaker interrogation- reviewed in detail today,  See PACEART report  ekg tracing ordered today is personally reviewed and shows atrial paced rhythm  Assessment and Plan:  1. Symptomatic sinus bradycardia  Normal pacemaker function See Pace Art report  2. HTN Very elevated Her PCP increased lisinopril  to 5mg  daily today Will check renal arterial dopplers.  MRI 2018 of abdomen did not note significant abnormality at that time. I will refer to Dr Oval Linsey for hypertension management  3. Tobacco Cessation advised  Merlin Return to see EP NP in a year  Thompson Grayer MD, [facility] 01/20/2019 12:43 PM

## 2019-01-21 ENCOUNTER — Ambulatory Visit: Payer: Medicare Other

## 2019-01-26 ENCOUNTER — Ambulatory Visit: Payer: Medicare Other

## 2019-01-26 LAB — CUP PACEART INCLINIC DEVICE CHECK
Battery Remaining Longevity: 116 mo
Battery Voltage: 3.01 V
Brady Statistic RA Percent Paced: 88 %
Brady Statistic RV Percent Paced: 1.2 %
Date Time Interrogation Session: 20200212181609
Implantable Lead Implant Date: 20190228
Implantable Lead Implant Date: 20190228
Implantable Lead Location: 753859
Implantable Lead Location: 753860
Implantable Pulse Generator Implant Date: 20190228
Lead Channel Impedance Value: 400 Ohm
Lead Channel Impedance Value: 562.5 Ohm
Lead Channel Pacing Threshold Amplitude: 0.5 V
Lead Channel Pacing Threshold Amplitude: 0.625 V
Lead Channel Pacing Threshold Pulse Width: 0.5 ms
Lead Channel Pacing Threshold Pulse Width: 0.5 ms
Lead Channel Sensing Intrinsic Amplitude: 12 mV
Lead Channel Sensing Intrinsic Amplitude: 4 mV
Lead Channel Setting Pacing Amplitude: 0.875
Lead Channel Setting Pacing Amplitude: 1.5 V
Lead Channel Setting Pacing Pulse Width: 0.5 ms
Lead Channel Setting Sensing Sensitivity: 2 mV
Pulse Gen Model: 2272
Pulse Gen Serial Number: 8996997

## 2019-01-29 ENCOUNTER — Other Ambulatory Visit: Payer: Medicare Other

## 2019-02-09 ENCOUNTER — Ambulatory Visit (HOSPITAL_COMMUNITY)
Admission: RE | Admit: 2019-02-09 | Payer: Medicare Other | Source: Ambulatory Visit | Attending: Internal Medicine | Admitting: Internal Medicine

## 2019-02-11 ENCOUNTER — Encounter: Payer: Self-pay | Admitting: Internal Medicine

## 2019-02-11 ENCOUNTER — Other Ambulatory Visit: Payer: Medicare Other

## 2019-02-19 ENCOUNTER — Ambulatory Visit: Payer: Medicare Other

## 2019-02-24 ENCOUNTER — Ambulatory Visit (INDEPENDENT_AMBULATORY_CARE_PROVIDER_SITE_OTHER): Payer: Medicare Other | Admitting: *Deleted

## 2019-02-24 ENCOUNTER — Other Ambulatory Visit: Payer: Self-pay

## 2019-02-24 DIAGNOSIS — I495 Sick sinus syndrome: Secondary | ICD-10-CM | POA: Diagnosis not present

## 2019-02-24 LAB — CUP PACEART REMOTE DEVICE CHECK
Battery Remaining Longevity: 116 mo
Battery Remaining Percentage: 95.5 %
Battery Voltage: 3.01 V
Brady Statistic AP VP Percent: 1.4 %
Brady Statistic AP VS Percent: 94 %
Brady Statistic AS VP Percent: 1 %
Brady Statistic AS VS Percent: 4.4 %
Brady Statistic RA Percent Paced: 94 %
Brady Statistic RV Percent Paced: 1.5 %
Date Time Interrogation Session: 20200318060014
Implantable Lead Implant Date: 20190228
Implantable Lead Implant Date: 20190228
Implantable Lead Location: 753859
Implantable Lead Location: 753860
Implantable Pulse Generator Implant Date: 20190228
Lead Channel Impedance Value: 390 Ohm
Lead Channel Impedance Value: 550 Ohm
Lead Channel Pacing Threshold Amplitude: 0.375 V
Lead Channel Pacing Threshold Amplitude: 0.625 V
Lead Channel Pacing Threshold Pulse Width: 0.5 ms
Lead Channel Pacing Threshold Pulse Width: 0.5 ms
Lead Channel Sensing Intrinsic Amplitude: 12 mV
Lead Channel Sensing Intrinsic Amplitude: 5 mV
Lead Channel Setting Pacing Amplitude: 0.875
Lead Channel Setting Pacing Amplitude: 1.375
Lead Channel Setting Pacing Pulse Width: 0.5 ms
Lead Channel Setting Sensing Sensitivity: 2 mV
Pulse Gen Model: 2272
Pulse Gen Serial Number: 8996997

## 2019-03-03 ENCOUNTER — Other Ambulatory Visit: Payer: Self-pay | Admitting: Internal Medicine

## 2019-03-03 DIAGNOSIS — M25552 Pain in left hip: Principal | ICD-10-CM

## 2019-03-03 DIAGNOSIS — M25561 Pain in right knee: Principal | ICD-10-CM

## 2019-03-03 DIAGNOSIS — M25551 Pain in right hip: Principal | ICD-10-CM

## 2019-03-03 DIAGNOSIS — M25562 Pain in left knee: Principal | ICD-10-CM

## 2019-03-03 DIAGNOSIS — G8929 Other chronic pain: Secondary | ICD-10-CM

## 2019-03-04 ENCOUNTER — Encounter: Payer: Self-pay | Admitting: Cardiology

## 2019-03-04 NOTE — Progress Notes (Signed)
Remote pacemaker transmission.   

## 2019-03-29 ENCOUNTER — Telehealth: Payer: Self-pay | Admitting: Physician Assistant

## 2019-03-29 NOTE — Telephone Encounter (Signed)
Patient set up for MyChart?  DECLINED Is patient using Smartphone/computer/tablet? ANDROID  Did audio/video work DOXIMITY  Does patient need telephone visit?NO  Best phone number to use? [phone]     Virtual Visit Pre-Appointment Phone Call  Steps For Call:  1. Confirm consent - "In the setting of the current Covid19 crisis, you are scheduled for a (phone or video) visit with your provider on (date) at (time).  Just as we do with many in-office visits, in order for you to participate in this visit, we must obtain consent.  If you'd like, I can send this to your mychart (if signed up) or email for you to review.  Otherwise, I can obtain your verbal consent now.  All virtual visits are billed to your insurance company just like a normal visit would be.  By agreeing to a virtual visit, we'd like you to understand that the technology does not allow for your provider to perform an examination, and thus may limit your provider's ability to fully assess your condition. If your provider identifies any concerns that need to be evaluated in person, we will make arrangements to do so.  Finally, though the technology is pretty good, we cannot assure that it will always work on either your or our end, and in the setting of a video visit, we may have to convert it to a phone-only visit.  In either situation, we cannot ensure that we have a secure connection.  Are you willing to proceed?" STAFF: Did the patient verbally acknowledge consent to telehealth visit? Document YES/NO here: YES  2. Confirm the BEST phone number to call the day of the visit by including in appointment notes  3. Give patient instructions for WebEx/MyChart download to smartphone as below or Doximity/Doxy.me if video visit (depending on what platform provider is using)  4. Advise patient to be prepared with their blood pressure, heart rate, weight, any heart rhythm information, their current medicines, and a piece of paper and pen  handy for any instructions they may receive the day of their visit  5. Inform patient they will receive a phone call 15 minutes prior to their appointment time (may be from unknown caller ID) so they should be prepared to answer  6. Confirm that appointment type is correct in Epic appointment notes (VIDEO vs PHONE)     TELEPHONE CALL NOTE  Shallyn Constancio has been deemed a candidate for a follow-up tele-health visit to limit community exposure during the Covid-19 pandemic. I spoke with the patient via phone to ensure availability of phone/video source, confirm preferred email & phone number, and discuss instructions and expectations.  I reminded Damara Klunder to be prepared with any vital sign and/or heart rhythm information that could potentially be obtained via home monitoring, at the time of her visit. I reminded Azka Steger to expect a phone call at the time of her visit if her visit.  Howie Ill 03/29/2019 11:20 AM   INSTRUCTIONS FOR DOWNLOADING THE WEBEX APP TO SMARTPHONE  - If Apple, ask patient to go to [employer] and type in WebEx in the search bar. [place] [employer], the blue/green circle. If Android, go to [employer] and type in [employer] in the search bar. The app is free but as with any other app downloads, their phone may require them to verify saved payment information or Apple/Android password.  - The patient does NOT have to create an account. - On the day of the  visit, the assist will walk the patient through joining the meeting with the meeting number/password.  INSTRUCTIONS FOR DOWNLOADING THE MYCHART APP TO SMARTPHONE  - The patient must first make sure to have activated MyChart and know their login information - If Apple, go to [employer] and type in MyChart in the search bar and download the app. If Android, ask patient to go to [employer] and type in [place] in the search bar and download the app. The app is free but as with any other app downloads,  their phone may require them to verify saved payment information or Apple/Android password.  - The patient will need to then log into the app with their MyChart username and password, and select [facility] as their healthcare provider to link the account. When it is time for your visit, go to the MyChart app, find appointments, and click Begin Video Visit. Be sure to Select Allow for your device to access the Microphone and Camera for your visit. You will then be connected, and your provider will be with you shortly.  **If they have any issues connecting, or need assistance please contact MyChart service desk [phone])**  **If using a computer, in order to ensure the best quality for their visit they will need to use either of the following Internet Browsers: [employer], or Google Chrome**  IF USING DOXIMITY or DOXY.ME - The patient will receive a link just prior to their visit, either by text or email (to be determined day of appointment depending on if it's doxy.me or Doximity).     FULL LENGTH CONSENT FOR TELE-HEALTH VISIT   I hereby voluntarily request, consent and authorize [facility] and its employed or contracted physicians, physician assistants, nurse practitioners or other licensed health care professionals (the Practitioner), to provide me with telemedicine health care services (the Services") as deemed necessary by the treating Practitioner. I acknowledge and consent to receive the Services by the Practitioner via telemedicine. I understand that the telemedicine visit will involve communicating with the Practitioner through live audiovisual communication technology and the disclosure of certain medical information by electronic transmission. I acknowledge that I have been given the opportunity to request an in-person assessment or other available alternative prior to the telemedicine visit and am voluntarily participating in the telemedicine visit.  I understand  that I have the right to withhold or withdraw my consent to the use of telemedicine in the course of my care at any time, without affecting my right to future care or treatment, and that the Practitioner or I may terminate the telemedicine visit at any time. I understand that I have the right to inspect all information obtained and/or recorded in the course of the telemedicine visit and may receive copies of available information for a reasonable fee.  I understand that some of the potential risks of receiving the Services via telemedicine include:   Delay or interruption in medical evaluation due to technological equipment failure or disruption;  Information transmitted may not be sufficient (e.g. poor resolution of images) to allow for appropriate medical decision making by the Practitioner; and/or   In rare instances, security protocols could fail, causing a breach of personal health information.  Furthermore, I acknowledge that it is my responsibility to provide information about my medical history, conditions and care that is complete and accurate to the best of my ability. I acknowledge that Practitioner's advice, recommendations, and/or decision may be based on factors not within their control, such as  incomplete or inaccurate data provided by me or distortions of diagnostic images or specimens that may result from electronic transmissions. I understand that the practice of medicine is not an exact science and that Practitioner makes no warranties or guarantees regarding treatment outcomes. I acknowledge that I will receive a copy of this consent concurrently upon execution via email to the email address I last provided but may also request a printed copy by calling the office of [facility].    I understand that my insurance will be billed for this visit.   I have read or had this consent read to me.  I understand the contents of this consent, which adequately explains the benefits and risks  of the Services being provided via telemedicine.   I have been provided ample opportunity to ask questions regarding this consent and the Services and have had my questions answered to my satisfaction.  I give my informed consent for the services to be provided through the use of telemedicine in my medical care  By participating in this telemedicine visit I agree to the above.

## 2019-03-30 DIAGNOSIS — M5126 Other intervertebral disc displacement, lumbar region: Secondary | ICD-10-CM | POA: Diagnosis not present

## 2019-03-30 DIAGNOSIS — G44309 Post-traumatic headache, unspecified, not intractable: Secondary | ICD-10-CM | POA: Diagnosis not present

## 2019-03-30 DIAGNOSIS — M25861 Other specified joint disorders, right knee: Secondary | ICD-10-CM | POA: Diagnosis not present

## 2019-03-30 DIAGNOSIS — I700 Atherosclerosis of aorta: Secondary | ICD-10-CM | POA: Diagnosis not present

## 2019-03-30 DIAGNOSIS — M542 Cervicalgia: Secondary | ICD-10-CM | POA: Diagnosis not present

## 2019-03-30 DIAGNOSIS — M47818 Spondylosis without myelopathy or radiculopathy, sacral and sacrococcygeal region: Secondary | ICD-10-CM | POA: Diagnosis not present

## 2019-03-30 DIAGNOSIS — M25852 Other specified joint disorders, left hip: Secondary | ICD-10-CM | POA: Diagnosis not present

## 2019-03-30 DIAGNOSIS — M160 Bilateral primary osteoarthritis of hip: Secondary | ICD-10-CM | POA: Diagnosis not present

## 2019-03-30 DIAGNOSIS — M79632 Pain in left forearm: Secondary | ICD-10-CM | POA: Diagnosis not present

## 2019-03-30 DIAGNOSIS — M25851 Other specified joint disorders, right hip: Secondary | ICD-10-CM | POA: Diagnosis not present

## 2019-03-30 DIAGNOSIS — M25462 Effusion, left knee: Secondary | ICD-10-CM | POA: Diagnosis not present

## 2019-03-30 DIAGNOSIS — W010XXA Fall on same level from slipping, tripping and stumbling without subsequent striking against object, initial encounter: Secondary | ICD-10-CM | POA: Diagnosis not present

## 2019-03-30 DIAGNOSIS — M19032 Primary osteoarthritis, left wrist: Secondary | ICD-10-CM | POA: Diagnosis not present

## 2019-03-30 DIAGNOSIS — M8588 Other specified disorders of bone density and structure, other site: Secondary | ICD-10-CM | POA: Diagnosis not present

## 2019-03-30 DIAGNOSIS — M48061 Spinal stenosis, lumbar region without neurogenic claudication: Secondary | ICD-10-CM | POA: Diagnosis not present

## 2019-03-30 DIAGNOSIS — M25522 Pain in left elbow: Secondary | ICD-10-CM | POA: Diagnosis not present

## 2019-03-30 DIAGNOSIS — I517 Cardiomegaly: Secondary | ICD-10-CM | POA: Diagnosis not present

## 2019-03-30 DIAGNOSIS — I2510 Atherosclerotic heart disease of native coronary artery without angina pectoris: Secondary | ICD-10-CM | POA: Diagnosis not present

## 2019-03-30 DIAGNOSIS — M25561 Pain in right knee: Secondary | ICD-10-CM | POA: Diagnosis not present

## 2019-03-30 DIAGNOSIS — K449 Diaphragmatic hernia without obstruction or gangrene: Secondary | ICD-10-CM | POA: Diagnosis not present

## 2019-03-30 DIAGNOSIS — R51 Headache: Secondary | ICD-10-CM | POA: Diagnosis not present

## 2019-03-30 DIAGNOSIS — M25532 Pain in left wrist: Secondary | ICD-10-CM | POA: Diagnosis not present

## 2019-03-30 DIAGNOSIS — Y998 Other external cause status: Secondary | ICD-10-CM | POA: Diagnosis not present

## 2019-03-30 DIAGNOSIS — M25862 Other specified joint disorders, left knee: Secondary | ICD-10-CM | POA: Diagnosis not present

## 2019-03-30 DIAGNOSIS — W19XXXA Unspecified fall, initial encounter: Secondary | ICD-10-CM | POA: Diagnosis not present

## 2019-03-30 DIAGNOSIS — M545 Low back pain: Secondary | ICD-10-CM | POA: Diagnosis not present

## 2019-03-30 DIAGNOSIS — R079 Chest pain, unspecified: Secondary | ICD-10-CM | POA: Diagnosis not present

## 2019-04-01 ENCOUNTER — Encounter: Payer: Self-pay | Admitting: Physician Assistant

## 2019-04-01 ENCOUNTER — Telehealth (INDEPENDENT_AMBULATORY_CARE_PROVIDER_SITE_OTHER): Payer: Medicare Other | Admitting: Physician Assistant

## 2019-04-01 ENCOUNTER — Other Ambulatory Visit: Payer: Self-pay

## 2019-04-01 ENCOUNTER — Telehealth: Payer: Self-pay

## 2019-04-01 VITALS — BP 148/80 | HR 65 | Ht 62.0 in | Wt 127.0 lb

## 2019-04-01 DIAGNOSIS — I350 Nonrheumatic aortic (valve) stenosis: Secondary | ICD-10-CM

## 2019-04-01 DIAGNOSIS — Q248 Other specified congenital malformations of heart: Secondary | ICD-10-CM

## 2019-04-01 DIAGNOSIS — I2510 Atherosclerotic heart disease of native coronary artery without angina pectoris: Secondary | ICD-10-CM | POA: Diagnosis not present

## 2019-04-01 DIAGNOSIS — B20 Human immunodeficiency virus [HIV] disease: Secondary | ICD-10-CM

## 2019-04-01 DIAGNOSIS — G8929 Other chronic pain: Secondary | ICD-10-CM

## 2019-04-01 DIAGNOSIS — I119 Hypertensive heart disease without heart failure: Secondary | ICD-10-CM

## 2019-04-01 DIAGNOSIS — M25552 Pain in left hip: Secondary | ICD-10-CM

## 2019-04-01 DIAGNOSIS — R001 Bradycardia, unspecified: Secondary | ICD-10-CM

## 2019-04-01 DIAGNOSIS — M25562 Pain in left knee: Secondary | ICD-10-CM

## 2019-04-01 DIAGNOSIS — M25561 Pain in right knee: Secondary | ICD-10-CM

## 2019-04-01 DIAGNOSIS — M25551 Pain in right hip: Secondary | ICD-10-CM

## 2019-04-01 MED ORDER — CARVEDILOL 3.125 MG PO TABS: 3.1250 mg | ORAL_TABLET | Freq: Two times a day (BID) | ORAL | 3 refills | Status: DC

## 2019-04-01 NOTE — Progress Notes (Signed)
Virtual Visit via Telephone Note   This visit type was conducted due to national recommendations for restrictions regarding the COVID-19 Pandemic (e.g. social distancing) in an effort to limit this patient's exposure and mitigate transmission in our community.  Due to her co-morbid illnesses, this patient is at least at moderate risk for complications without adequate follow up.  This format is felt to be most appropriate for this patient at this time.  The patient had technical difficulties with video requiring transitioning to audio format only (telephone). All issues noted in this document were discussed and addressed.  No formal physical exam could be performed with this format.  Please refer to the patient's chart for her  consent to telehealth for [facility].   Evaluation Performed:  Follow-up visit  Date:  04/01/2019   ID:  Nicole Mccormick, DOB 18-May-1946, MRN [id number]  Patient Location: Home Provider Location: Home  PCP:  Sonia Side., FNP  Cardiologist:  Ena Dawley, MD  Electrophysiologist:  Thompson Grayer, MD   Chief Complaint:  F/u CAD, hypertensive heart disease, aortic stenosis  History of Present Illness:    Nicole Mccormick (Roo-eez) is a 73 y.o. female with diffuse CAD of RCA by cath 2016 managed medically, hypertensive heart disease with LV mid cavity dynamic obstruction, mild-moderate aortic stenosis, bradycardia s/p PPM, HIV, hepatitis C antibody positive, AVMs, tobacco abuse, iron deficiency anemia, HTN, asthma, arthritis, seizures, varicosities who presents for routine cardiovascular follow-up. There is mention of diastolic CHF in her notes but diagnosis unclear as other cardiology notes indicate "Without CHF." She has not required standing loop diuretic. In 2016 she was reporting exertional dyspnea and underewnt cath showing complex, calcific RCA disease with small and diffusely diseased distal PDA as well. Per cath report at that time, "Her circumflex is codominant.   I think medical therapy is the best treatemnt at this time given her issues with GI bleeding.  Rotational atherectomy would be needed and high risk given the tortuousity." Echo had shown EF 70-75% with hyperdynamic LV function, dynamic obstruction at rest in the mid cavity, grade 1 DD. She wore a monitor in 2017 which demonstrated significant bradycardia with HR down the 30's. She initially deferred PPM due to family events with a grandchild coming. In 2019 she continued to have headaches, fatigue and dizziness and ultimately underwent St. Jude MRI conditional dual chamber PPM 02/05/18 by Dr. Rayann Heman. Pre-PPM echo 01/2018 showed mild LVH, EF 65-70%, dynamic obstruction at rest in the mid cavity, mild-moderate aortic stenosis. Dr. Rayann Heman saw the patient in 01/2019 and ordered renal artery duplex which had not yet been obtained. Last labs 03/30/19 showed Hgb 13.8, Plt 203, K 4.1, Cr 0.83, LFTs wnl, last lipids 11/2017 showed LDL 35 (followed by ID).  From cardiac standpoint the patient is doing very well. She has not had any CP, SOB, palpitations, syncope or fluid retention. She has a dx of asthma but rarely has flare ups or issues with this. She sustained a slip and fall at [employer] earlier this week on a puddle of water and hit her left shoulder and suffered a wrist injury as well. She was evaluated in [facility] emergency department for this and has been in communication with her PCP. She denies any known injury to her PPM site. The patient does not have symptoms concerning for COVID-19 infection (fever, chills, cough, or new shortness of breath).   Past Medical History:  Diagnosis Date   Abnormal Pap smear    Aortic stenosis  Arthritis    Asthma    AVM (arteriovenous malformation)    Bradycardia    Complication of anesthesia    "they have a hard time bringing me back" (11/08/2015)   Coronary artery disease    a. diffuse CAD of RCA by cath 2016 managed medically.   Dynamic left ventricular  outflow obstruction    GERD (gastroesophageal reflux disease)    previously on aciphex, discontinued november 2012  because patient asymptomatic, and concern about interference with HIV meds.  May try pepcid in the future if symptoms return   Heart murmur    Hepatitis C antibody test positive    HIV infection ([place])    diagnosed before 2008   Hypertension    Hypertensive heart disease    Iron deficiency anemia    Ferritin = 2 in november 2012, started on iron supplemenation   Pacemaker    a. St Jude PPM 01/2018.   Seizures ([place])    Varicosities    Past Surgical History:  Procedure Laterality Date   ABDOMINAL HYSTERECTOMY     CARDIAC CATHETERIZATION N/A 11/08/2015   Procedure: Left Heart Cath and Coronary Angiography;  Surgeon: Jettie Booze, MD;  Location: [place] CV LAB;  Service: Cardiovascular;  Laterality: N/A;   COLONOSCOPY  10/13/11   small adenoma, anal condyloma   COLONOSCOPY  10/13/2011   Procedure: COLONOSCOPY;  Surgeon: Gatha Mayer, MD;  Location: [place];  Service: Endoscopy;  Laterality: N/A;   COLONOSCOPY N/A 09/14/2014   Procedure: COLONOSCOPY;  Surgeon: Arta Silence, MD;  Location: [facility] ENDOSCOPY;  Service: Endoscopy;  Laterality: N/A;   ESOPHAGOGASTRODUODENOSCOPY  10/13/11   small hiatal hernia   ESOPHAGOGASTRODUODENOSCOPY  10/13/2011   Procedure: ESOPHAGOGASTRODUODENOSCOPY (EGD);  Surgeon: Gatha Mayer, MD;  Location: [facility] ENDOSCOPY;  Service: Endoscopy;  Laterality: N/A;   ESOPHAGOGASTRODUODENOSCOPY N/A 09/14/2014   Procedure: ESOPHAGOGASTRODUODENOSCOPY (EGD);  Surgeon: Arta Silence, MD;  Location: [facility] ENDOSCOPY;  Service: Endoscopy;  Laterality: N/A;   GIVENS CAPSULE STUDY N/A 09/14/2014   Procedure: GIVENS CAPSULE STUDY;  Surgeon: Arta Silence, MD;  Location: [facility] ENDOSCOPY;  Service: Endoscopy;  Laterality: N/A;   PACEMAKER IMPLANT N/A 02/05/2018   Procedure: PACEMAKER IMPLANT;  Surgeon: Thompson Grayer, MD;  Location: [place]  CV LAB;  Service: Cardiovascular;  Laterality: N/A;     Current Meds  Medication Sig   acyclovir (ZOVIRAX) 400 MG tablet Take 1 tablet (400 mg total) by mouth daily as needed (for flare ups).   albuterol (PROAIR HFA) 108 (90 Base) MCG/ACT inhaler INHALE 2 PUFFS BY MOUTH EVERY 4 HOURS IF NEEDED FOR WHEEZING OR SHORTNESS OF BREATH   albuterol (PROVENTIL) (2.5 MG/3ML) 0.083% nebulizer solution Take 3 mLs (2.5 mg total) by nebulization every 6 (six) hours as needed for wheezing or shortness of breath.   amLODipine (NORVASC) 10 MG tablet TAKE 1 TABLET BY MOUTH EVERY DAY   Ascorbic Acid (VITAMIN C) 1000 MG tablet Take 1,000 mg by mouth daily.   atorvastatin (LIPITOR) 40 MG tablet take 1 tablet by mouth once daily AT 6 PM   bictegravir-emtricitabine-tenofovir AF (BIKTARVY) 50-200-25 MG TABS tablet Take 1 tablet by mouth daily.   Cetirizine HCl (ZYRTEC ALLERGY) 10 MG TBDP Take 1 tablet by mouth daily.   cholecalciferol (VITAMIN D) 1000 units tablet Take 1,000 Units by mouth daily.   cyclobenzaprine (FLEXERIL) 5 MG tablet TAKE 1 TABLET BY MOUTH TWICE DAILY AS NEEDED FOR MUSCLE SPASM   diazepam (VALIUM) 2 MG tablet take 1 tablet by mouth  every 6 hours if needed for anxiety   escitalopram (LEXAPRO) 10 MG tablet Take 1 tablet by mouth daily as needed for anxiety.   famotidine (PEPCID) 20 MG tablet Take 1 tablet (20 mg total) by mouth 2 (two) times daily.   ferrous sulfate 325 (65 FE) MG EC tablet Take 325 mg by mouth daily.   Flaxseed, Linseed, (FLAX SEED OIL) 1000 MG CAPS Take 1,000 mg by mouth daily.   gabapentin (NEURONTIN) 600 MG tablet TAKE 1 TABLET BY MOUTH THREE TIMES DAILY   HYDROcodone-acetaminophen (NORCO) 5-325 MG tablet Take 1 tablet by mouth every 6 (six) hours as needed for moderate pain.   isosorbide mononitrate (IMDUR) 60 MG 24 hr tablet Take 1 tablet (60 mg total) by mouth daily.   lisinopril (ZESTRIL) 10 MG tablet Take 10 mg by mouth daily.   Omega-3 Fatty Acids  (FISH OIL) 1000 MG CPDR Take 1,000 mg by mouth daily.   ondansetron (ZOFRAN-ODT) 4 MG disintegrating tablet Take 1 tablet (4 mg total) by mouth daily as needed for nausea or vomiting.   pantoprazole (PROTONIX) 40 MG tablet Take 1 tablet (40 mg total) by mouth daily.   RA ASPIRIN EC ADULT LOW ST 81 MG EC tablet Take 1 tablet (81 mg total) by mouth daily.   spironolactone (ALDACTONE) 25 MG tablet TAKE 1 TABLET BY MOUTH EVERY DAY   triamcinolone cream (KENALOG) 0.1 % Apply 1 application topically 2 (two) times daily.   vitamin A 10000 UNIT capsule Take 10,000 Units by mouth daily.   vitamin B-12 (CYANOCOBALAMIN) 1000 MCG tablet Take 1,000 mcg by mouth daily.     Allergies:   Penicillins and Avelox [moxifloxacin hcl in nacl]   Social History   Tobacco Use   Smoking status: Current Some Day Smoker    Packs/day: 0.50    Years: 50.00    Pack years: 25.00    Types: Cigarettes   Smokeless tobacco: Never Used   Tobacco comment: Wants to quit. Using both the patch and gum.  Substance Use Topics   Alcohol use: Yes    Alcohol/week: 10.0 standard drinks    Types: 10 Glasses of wine per week    Comment: Wine.   Drug use: No     Family Hx: The patient's family history includes Colon cancer in her sister; Diabetes in her mother; Ovarian cancer in her sister.  ROS:   Please see the history of present illness.    All other systems reviewed and are negative.   Prior CV studies:   Reviewed above  Labs/Other Tests and Data Reviewed:    EKG:  01/20/19 - Atrial paced rhythm with nonspecific ST T changes  Recent Labs: 07/06/2018: ALT 8; BUN 24; Creat 0.90; Hemoglobin 13.1; Platelets 249; Potassium 4.5; Sodium 139   Recent Lipid Panel Lab Results  Component Value Date/Time   CHOL 111 11/10/2017 02:17 PM   TRIG 89 11/10/2017 02:17 PM   HDL 59 11/10/2017 02:17 PM   CHOLHDL 1.9 11/10/2017 02:17 PM   LDLCALC 35 11/10/2017 02:17 PM    Wt Readings from Last 3 Encounters:    04/01/19 127 lb (57.6 kg)  01/20/19 123 lb (55.8 kg)  07/06/18 128 lb (58.1 kg)     Objective:    Vital Signs:  BP (!) 148/80    Pulse 65    Ht 5\' 2"  (1.575 m)    Wt 127 lb (57.6 kg)    BMI 23.23 kg/m    VITAL SIGNS:  reviewed GEN:  engaging, alert female without any verbalization of distress RESPIRATORY:  no labored breathing on the phone, no wheezing or coughing noted NEURO:  alert and oriented x 3, no slurred speech PSYCH:  normal affect  ASSESSMENT & PLAN:    1. CAD - no symptoms to suggest angina. She is on low dose ASA with stable Hgb by 03/2019 labs at [facility]. Lipids are followed by ID, in setting of concomitant HIV treatment. 2. Hypertensive heart disease with dynamic LVOT obstruction - no current symptoms. Dr. Meda Coffee has previously suggested that she would benefit from a beta blocker once her PPM was implanted. She also needs better BP control. I worry metoprolol wouldn't do much for her BP. Will add carvedilol 3.125mg  BID. I have asked her to follow her BP daily and call in a few days with the readings for our review as well as update how she is feeling. If she has any change in her breathing will need to consider more selective agent. She is on Imdur and nitrates are typically avoided in this subset of patients - however, given that she is asymptomatic and has a concomitant diagnosis of significant RCA CAD, will hold course. 3. Aortic stenosis - per guidelines, f/u echo recommended every 1-2 years, closer to 1 year if heavy calcification. As she is asymptomatic and we are hoping to defer community exposure in setting of Covid pandemic, will arrange echo for 6 months from now. She will notify us of any new symptoms. 4. Essential HTN - follow BP with addition of carvedilol. She did not know that the renal duplex was previously scheduled. Await response to BP. If BP still elevated, consider re-visiting this once Covid pandemic has passed.  5. Bradycardia s/p PPM - followed by EP. They  anticipate seeing patient next in 01/2020. Have asked nurse to reach out to device clinic to find out if there is anything the patient needs to do s/p fall to ensure device integrity - they request manual transmission. I will have the nurse call to give instructions and they will be on the lookout for this.   COVID-19 Education: The signs and symptoms of COVID-19 were discussed with the patient and how to seek care for testing (follow up with PCP or arrange E-visit).  The importance of social distancing was discussed today.  Time:   Today, I have spent 18 minutes with the patient with telehealth technology discussing the above problems.     Medication Adjustments/Labs and Tests Ordered: Current medicines are reviewed at length with the patient today.  Concerns regarding medicines are outlined above.   Disposition:  Follow up 6 months with Dr. Meda Coffee  Signed, Charlie Pitter, PA-C  04/01/2019 11:20 AM    [place]

## 2019-04-01 NOTE — Telephone Encounter (Signed)
Pt daughter called I helped them send a manual transmission successfully.

## 2019-04-01 NOTE — Telephone Encounter (Signed)
Normal device function as per last scheduled remote 02/2019 (see procedures tab). Follow up as scheduled  Chanetta Marshall, NP 04/01/2019 5:45 PM

## 2019-04-01 NOTE — Telephone Encounter (Signed)
Pt is waiting for her daughter in law to come to her house to help her do the transmission. I gave the pt my direct office number and told her I would be more than happy to help her send a manual transmission when she is ready.

## 2019-04-01 NOTE — Patient Instructions (Addendum)
Medication Instructions:  Your physician has recommended you make the following change in your medication:  1.  START Carvedilol (Coreg) 3.125 taking 1 tablet twice a day  Monitor your blood pressure for a week and call our office next week with the readings.   If you need a refill on your cardiac medications before your next appointment, please call your pharmacy.   Lab work: None ordered  If you have labs (blood work) drawn today and your tests are completely normal, you will receive your results only by: Marland Kitchen MyChart Message (if you have MyChart) OR . A paper copy in the mail If you have any lab test that is abnormal or we need to change your treatment, we will call you to review the results.  Testing/Procedures: Your physician has requested that you have an echocardiogram IN 6 MONTHS.  SOMEONE FROM THE SCHEDULING DEPARTMENT WILL CALL AND GET THIS ARRANGED. Echocardiography is a painless test that uses sound waves to create images of your heart. It provides your doctor with information about the size and shape of your heart and how well your heart's chambers and valves are working. This procedure takes approximately one hour. There are no restrictions for this procedure.    Follow-Up: At [facility], you and your health needs are our priority.  As part of our continuing mission to provide you with exceptional heart care, we have created designated Provider Care Teams.  These Care Teams include your primary Cardiologist (physician) and Advanced Practice Providers (APPs -  Physician Assistants and Nurse Practitioners) who all work together to provide you with the care you need, when you need it. You will need a follow up appointment in 6 months.  Please call our office 2 months in advance to schedule this appointment.  You may see Ena Dawley, MD or one of the following Advanced Practice Providers on your designated Care Team:   [place], PA-C Melina Copa, PA-C . Ermalinda Barrios,  PA-C  Any Other Special Instructions Will Be Listed Below (If Applicable).    Echocardiogram An echocardiogram is a procedure that uses painless sound waves (ultrasound) to produce an image of the heart. Images from an echocardiogram can provide important information about:  Signs of coronary artery disease (CAD).  Aneurysm detection. An aneurysm is a weak or damaged part of an artery wall that bulges out from the normal force of blood pumping through the body.  Heart size and shape. Changes in the size or shape of the heart can be associated with certain conditions, including heart failure, aneurysm, and CAD.  Heart muscle function.  Heart valve function.  Signs of a past heart attack.  Fluid buildup around the heart.  Thickening of the heart muscle.  A tumor or infectious growth around the heart valves. Tell a health care provider about:  Any allergies you have.  All medicines you are taking, including vitamins, herbs, eye drops, creams, and over-the-counter medicines.  Any blood disorders you have.  Any surgeries you have had.  Any medical conditions you have.  Whether you are pregnant or may be pregnant. What are the risks? Generally, this is a safe procedure. However, problems may occur, including:  Allergic reaction to dye (contrast) that may be used during the procedure. What happens before the procedure? No specific preparation is needed. You may eat and drink normally. What happens during the procedure?   An IV tube may be inserted into one of your veins.  You may receive contrast through this  tube. A contrast is an injection that improves the quality of the pictures from your heart.  A gel will be applied to your chest.  A wand-like tool (transducer) will be moved over your chest. The gel will help to transmit the sound waves from the transducer.  The sound waves will harmlessly bounce off of your heart to allow the heart images to be captured in  real-time motion. The images will be recorded on a computer. The procedure may vary among health care providers and hospitals. What happens after the procedure?  You may return to your normal, everyday life, including diet, activities, and medicines, unless your health care provider tells you not to do that. Summary  An echocardiogram is a procedure that uses painless sound waves (ultrasound) to produce an image of the heart.  Images from an echocardiogram can provide important information about the size and shape of your heart, heart muscle function, heart valve function, and fluid buildup around your heart.  You do not need to do anything to prepare before this procedure. You may eat and drink normally.  After the echocardiogram is completed, you may return to your normal, everyday life, unless your health care provider tells you not to do that. This information is not intended to replace advice given to you by your health care provider. Make sure you discuss any questions you have with your health care provider. Document Released: 11/22/2000 Document Revised: 12/28/2016 Document Reviewed: 12/28/2016 Elsevier Interactive Patient Education  2019 [employer].

## 2019-04-02 ENCOUNTER — Other Ambulatory Visit: Payer: Self-pay | Admitting: Infectious Diseases

## 2019-04-02 DIAGNOSIS — J449 Chronic obstructive pulmonary disease, unspecified: Secondary | ICD-10-CM | POA: Diagnosis not present

## 2019-04-02 DIAGNOSIS — I10 Essential (primary) hypertension: Secondary | ICD-10-CM

## 2019-04-02 DIAGNOSIS — I5032 Chronic diastolic (congestive) heart failure: Secondary | ICD-10-CM | POA: Diagnosis not present

## 2019-04-02 DIAGNOSIS — R072 Precordial pain: Secondary | ICD-10-CM

## 2019-04-02 DIAGNOSIS — B20 Human immunodeficiency virus [HIV] disease: Secondary | ICD-10-CM

## 2019-04-02 DIAGNOSIS — I495 Sick sinus syndrome: Secondary | ICD-10-CM | POA: Diagnosis not present

## 2019-04-02 NOTE — Telephone Encounter (Signed)
Thank you Amber! I will route to Triage to let patient device interrogation looked fine since Anderson Malta is off today.  Virl Coble PA-C

## 2019-04-02 NOTE — Telephone Encounter (Signed)
I spoke to the patient and informed her of interrogation results.  She verbalized understanding.

## 2019-04-03 ENCOUNTER — Other Ambulatory Visit: Payer: Self-pay | Admitting: Infectious Diseases

## 2019-04-03 DIAGNOSIS — B20 Human immunodeficiency virus [HIV] disease: Secondary | ICD-10-CM

## 2019-04-21 DIAGNOSIS — M25532 Pain in left wrist: Secondary | ICD-10-CM | POA: Diagnosis not present

## 2019-04-21 DIAGNOSIS — M25519 Pain in unspecified shoulder: Secondary | ICD-10-CM | POA: Diagnosis not present

## 2019-04-21 DIAGNOSIS — M792 Neuralgia and neuritis, unspecified: Secondary | ICD-10-CM | POA: Diagnosis not present

## 2019-04-28 DIAGNOSIS — M19032 Primary osteoarthritis, left wrist: Secondary | ICD-10-CM | POA: Diagnosis not present

## 2019-04-28 DIAGNOSIS — M19019 Primary osteoarthritis, unspecified shoulder: Secondary | ICD-10-CM | POA: Insufficient documentation

## 2019-04-28 DIAGNOSIS — M25532 Pain in left wrist: Secondary | ICD-10-CM | POA: Diagnosis not present

## 2019-04-28 DIAGNOSIS — M19039 Primary osteoarthritis, unspecified wrist: Secondary | ICD-10-CM | POA: Insufficient documentation

## 2019-04-28 DIAGNOSIS — M19012 Primary osteoarthritis, left shoulder: Secondary | ICD-10-CM | POA: Diagnosis not present

## 2019-04-28 DIAGNOSIS — M25512 Pain in left shoulder: Secondary | ICD-10-CM | POA: Diagnosis not present

## 2019-05-02 ENCOUNTER — Other Ambulatory Visit: Payer: Self-pay | Admitting: Internal Medicine

## 2019-05-02 DIAGNOSIS — G8929 Other chronic pain: Secondary | ICD-10-CM

## 2019-05-26 ENCOUNTER — Ambulatory Visit (INDEPENDENT_AMBULATORY_CARE_PROVIDER_SITE_OTHER): Payer: Medicare Other | Admitting: *Deleted

## 2019-05-26 DIAGNOSIS — I495 Sick sinus syndrome: Secondary | ICD-10-CM

## 2019-05-26 LAB — CUP PACEART REMOTE DEVICE CHECK
Battery Remaining Longevity: 125 mo
Battery Remaining Percentage: 95.5 %
Battery Voltage: 3.01 V
Brady Statistic AP VP Percent: 1.3 %
Brady Statistic AP VS Percent: 93 %
Brady Statistic AS VP Percent: 1 %
Brady Statistic AS VS Percent: 5.7 %
Brady Statistic RA Percent Paced: 93 %
Brady Statistic RV Percent Paced: 1.3 %
Date Time Interrogation Session: 20200617060029
Implantable Lead Implant Date: 20190228
Implantable Lead Implant Date: 20190228
Implantable Lead Location: 753859
Implantable Lead Location: 753860
Implantable Pulse Generator Implant Date: 20190228
Lead Channel Impedance Value: 400 Ohm
Lead Channel Impedance Value: 590 Ohm
Lead Channel Pacing Threshold Amplitude: 0.5 V
Lead Channel Pacing Threshold Amplitude: 0.625 V
Lead Channel Pacing Threshold Pulse Width: 0.5 ms
Lead Channel Pacing Threshold Pulse Width: 0.5 ms
Lead Channel Sensing Intrinsic Amplitude: 12 mV
Lead Channel Sensing Intrinsic Amplitude: 5 mV
Lead Channel Setting Pacing Amplitude: 0.875
Lead Channel Setting Pacing Amplitude: 1.5 V
Lead Channel Setting Pacing Pulse Width: 0.5 ms
Lead Channel Setting Sensing Sensitivity: 2 mV
Pulse Gen Model: 2272
Pulse Gen Serial Number: 8996997

## 2019-06-02 ENCOUNTER — Encounter

## 2019-06-09 ENCOUNTER — Encounter: Payer: Self-pay | Admitting: Cardiology

## 2019-06-09 NOTE — Progress Notes (Signed)
Remote pacemaker transmission.   

## 2019-08-25 ENCOUNTER — Ambulatory Visit (INDEPENDENT_AMBULATORY_CARE_PROVIDER_SITE_OTHER): Payer: Medicare Other | Admitting: *Deleted

## 2019-08-25 DIAGNOSIS — I495 Sick sinus syndrome: Secondary | ICD-10-CM

## 2019-08-25 LAB — CUP PACEART REMOTE DEVICE CHECK
Battery Remaining Longevity: 124 mo
Battery Remaining Percentage: 95.5 %
Battery Voltage: 3.01 V
Brady Statistic AP VP Percent: 1.4 %
Brady Statistic AP VS Percent: 94 %
Brady Statistic AS VP Percent: 1 %
Brady Statistic AS VS Percent: 4.9 %
Brady Statistic RA Percent Paced: 94 %
Brady Statistic RV Percent Paced: 1.4 %
Date Time Interrogation Session: 20200916060015
Implantable Lead Implant Date: 20190228
Implantable Lead Implant Date: 20190228
Implantable Lead Location: 753859
Implantable Lead Location: 753860
Implantable Pulse Generator Implant Date: 20190228
Lead Channel Impedance Value: 390 Ohm
Lead Channel Impedance Value: 530 Ohm
Lead Channel Pacing Threshold Amplitude: 0.375 V
Lead Channel Pacing Threshold Amplitude: 0.625 V
Lead Channel Pacing Threshold Pulse Width: 0.5 ms
Lead Channel Pacing Threshold Pulse Width: 0.5 ms
Lead Channel Sensing Intrinsic Amplitude: 3.1 mV
Lead Channel Sensing Intrinsic Amplitude: 9.7 mV
Lead Channel Setting Pacing Amplitude: 0.875
Lead Channel Setting Pacing Amplitude: 1.375
Lead Channel Setting Pacing Pulse Width: 0.5 ms
Lead Channel Setting Sensing Sensitivity: 2 mV
Pulse Gen Model: 2272
Pulse Gen Serial Number: 8996997

## 2019-08-31 ENCOUNTER — Encounter: Payer: Self-pay | Admitting: Cardiology

## 2019-08-31 NOTE — Progress Notes (Signed)
Remote pacemaker transmission.   

## 2019-10-08 ENCOUNTER — Other Ambulatory Visit (HOSPITAL_COMMUNITY): Payer: Medicare Other

## 2019-10-08 ENCOUNTER — Encounter (HOSPITAL_COMMUNITY): Payer: Self-pay | Admitting: Physician Assistant

## 2019-10-26 ENCOUNTER — Other Ambulatory Visit (HOSPITAL_COMMUNITY): Payer: Medicare Other

## 2019-11-09 ENCOUNTER — Other Ambulatory Visit (HOSPITAL_COMMUNITY): Payer: Medicare Other

## 2019-11-16 ENCOUNTER — Other Ambulatory Visit (HOSPITAL_COMMUNITY): Payer: Medicare Other

## 2019-11-23 ENCOUNTER — Other Ambulatory Visit (HOSPITAL_COMMUNITY): Payer: Medicare Other

## 2019-11-24 ENCOUNTER — Ambulatory Visit (INDEPENDENT_AMBULATORY_CARE_PROVIDER_SITE_OTHER): Payer: Medicare Other | Admitting: *Deleted

## 2019-11-24 DIAGNOSIS — I495 Sick sinus syndrome: Secondary | ICD-10-CM | POA: Diagnosis not present

## 2019-11-25 LAB — CUP PACEART REMOTE DEVICE CHECK
Battery Remaining Longevity: 123 mo
Battery Remaining Percentage: 95.5 %
Battery Voltage: 3.01 V
Brady Statistic AP VP Percent: 1.4 %
Brady Statistic AP VS Percent: 95 %
Brady Statistic AS VP Percent: 1 %
Brady Statistic AS VS Percent: 3.8 %
Brady Statistic RA Percent Paced: 95 %
Brady Statistic RV Percent Paced: 1.4 %
Date Time Interrogation Session: 20201216020015
Implantable Lead Implant Date: 20190228
Implantable Lead Implant Date: 20190228
Implantable Lead Location: 753859
Implantable Lead Location: 753860
Implantable Pulse Generator Implant Date: 20190228
Lead Channel Impedance Value: 360 Ohm
Lead Channel Impedance Value: 510 Ohm
Lead Channel Pacing Threshold Amplitude: 0.375 V
Lead Channel Pacing Threshold Amplitude: 0.625 V
Lead Channel Pacing Threshold Pulse Width: 0.5 ms
Lead Channel Pacing Threshold Pulse Width: 0.5 ms
Lead Channel Sensing Intrinsic Amplitude: 3 mV
Lead Channel Sensing Intrinsic Amplitude: 9 mV
Lead Channel Setting Pacing Amplitude: 0.875
Lead Channel Setting Pacing Amplitude: 1.375
Lead Channel Setting Pacing Pulse Width: 0.5 ms
Lead Channel Setting Sensing Sensitivity: 2 mV
Pulse Gen Model: 2272
Pulse Gen Serial Number: 8996997

## 2019-12-21 ENCOUNTER — Ambulatory Visit (HOSPITAL_COMMUNITY): Payer: Medicare Other | Attending: Cardiology

## 2019-12-21 ENCOUNTER — Other Ambulatory Visit: Payer: Self-pay

## 2019-12-21 DIAGNOSIS — I350 Nonrheumatic aortic (valve) stenosis: Secondary | ICD-10-CM | POA: Insufficient documentation

## 2019-12-29 NOTE — Progress Notes (Deleted)
Cardiology Office Note    Date:  12/29/2019   ID:  Nicole Mccormick, DOB Jan 08, 1946, MRN [id number]  PCP:  Sonia Side., FNP  Cardiologist:  Ena Dawley, MD  Electrophysiologist:  Thompson Grayer, MD   Chief Complaint: f/u hypertensive heart disease, CAD, BP  History of Present Illness:   Nicole Mccormick is a 74 y.o. female with history of diffuse CAD of RCA by cath 2016 managed medically, hypertensive heart disease with LV mid cavity dynamic obstruction, mild-moderate aortic stenosis, bradycardia s/p PPM, HIV, hepatitis C antibody positive, AVMs, tobacco abuse, iron deficiency anemia, HTN, asthma, arthritis, seizures, varicosities who presents for routine cardiovascular follow-up.   There is mention of diastolic CHF in her notes but diagnosis unclear as other cardiology notes indicate without CHF. She has not required standing loop diuretic. In 2016 she was reporting exertional dyspnea and underewnt cath showing complex, calcific RCA disease with small and diffusely diseased distal PDA as well. Per cath report at that time, "Her circumflex is codominant. I think medical therapy is the best treatemnt at this time given her issues with GI bleeding.  Rotational atherectomy would be needed and high risk given the tortuousity." She wore a monitor in 2017 which demonstrated significant bradycardia with HR down the 30's. She initially deferred PPM due to family events with a grandchild coming. In 2019 she continued to have headaches, fatigue and dizziness and ultimately underwent St. Jude MRI conditional dual chamber PPM 02/05/18 by Dr. Rayann Heman. Pre-PPM echo 01/2018 showed mild LVH, EF 65-70%, dynamic obstruction at rest in the mid cavity, mild-moderate aortic stenosis. Dr. Rayann Heman saw the patient in 01/2019 and ordered renal artery duplex which had not yet been obtained. I saw her via telemedicine over the spring of 2020 and she was doing well, except had had a slip/fall at [facility] recently. We added carvedilol  for elevated blood pressure. Due to her valve disease, a f/u echocardiogram was ordered which was done 12/21/19 showing EF >75%, hyperdynamic, intracavitary gradient of 4.6 m/s (26mmHg), moderate LVH, grade 1 DD, normal RVF, mild-moderate aortic stenosis (mean gradient 19 mmHg), mildly elevated PA systolic pressure. Last labs personally reviewed from 03/30/19 showed Hgb 13.8, Plt 203, K 4.1, Cr 0.83, LFTs wnl, last lipids 11/2017 showed LDL 35 (followed by ID).   [place] labs? renal duplex if needed esnure ep f/u dynamic lvot  Essential HTN CAD Hypertensive heart disease with dynamic LVOT obstruction Aortic stenosis Bradycardia s/p PPM   Past Medical History:  Diagnosis Date  . Abnormal Pap smear   . Aortic stenosis   . Arthritis   . Asthma   . AVM (arteriovenous malformation)   . Bradycardia   . Complication of anesthesia    "they have a hard time bringing me back" (11/08/2015)  . Coronary artery disease    a. diffuse CAD of RCA by cath 2016 managed medically.  . Dynamic left ventricular outflow obstruction   . GERD (gastroesophageal reflux disease)    previously on aciphex, discontinued november 2012  because patient asymptomatic, and concern about interference with HIV meds.  May try pepcid in the future if symptoms return  . Heart murmur   . Hepatitis C antibody test positive   . HIV infection ([place])    diagnosed before 2008  . Hypertension   . Hypertensive heart disease   . Iron deficiency anemia    Ferritin = 2 in november 2012, started on iron supplemenation  . Pacemaker    a. St Jude PPM 01/2018.  Marland Kitchen  Seizures ([place])   . Varicosities     Past Surgical History:  Procedure Laterality Date  . ABDOMINAL HYSTERECTOMY    . CARDIAC CATHETERIZATION N/A 11/08/2015   Procedure: Left Heart Cath and Coronary Angiography;  Surgeon: Jettie Booze, MD;  Location: [facility];  Service: Cardiovascular;  Laterality: N/A;  . COLONOSCOPY  10/13/11   small adenoma, anal  condyloma  . COLONOSCOPY  10/13/2011   Procedure: COLONOSCOPY;  Surgeon: Gatha Mayer, MD;  Location: [facility];  Service: Endoscopy;  Laterality: N/A;  . COLONOSCOPY N/A 09/14/2014   Procedure: COLONOSCOPY;  Surgeon: Arta Silence, MD;  Location: [facility] ENDOSCOPY;  Service: Endoscopy;  Laterality: N/A;  . ESOPHAGOGASTRODUODENOSCOPY  10/13/11   small hiatal hernia  . ESOPHAGOGASTRODUODENOSCOPY  10/13/2011   Procedure: ESOPHAGOGASTRODUODENOSCOPY (EGD);  Surgeon: Gatha Mayer, MD;  Location[facility] ENDOSCOPY;  Service: Endoscopy;  Laterality: N/A;  . ESOPHAGOGASTRODUODENOSCOPY N/A 09/14/2014   Procedure: ESOPHAGOGASTRODUODENOSCOPY (EGD);  Surgeon: Arta Silence, MD;  Location: [facility] ENDOSCOPY;  Service: Endoscopy;  Laterality: N/A;  . GIVENS CAPSULE STUDY N/A 09/14/2014   Procedure: GIVENS CAPSULE STUDY;  Surgeon: Arta Silence, MD;  Location: [facility] ENDOSCOPY;  Service: Endoscopy;  Laterality: N/A;  . PACEMAKER IMPLANT N/A 02/05/2018   Procedure: PACEMAKER IMPLANT;  Surgeon: Thompson Grayer, MD;  Location: [facility];  Service: Cardiovascular;  Laterality: N/A;    Current Medications: No outpatient medications have been marked as taking for the 12/31/19 encounter (Appointment) with Charlie Pitter, PA-C.   ***   Allergies:   Penicillins and Avelox [moxifloxacin hcl in nacl]   Social History   Socioeconomic History  . Marital status: Married    Spouse name: Not on file  . Number of children: Not on file  . Years of education: Not on file  . Highest education level: Not on file  Occupational History  . Not on file  Tobacco Use  . Smoking status: Current Some Day Smoker    Packs/day: 0.50    Years: 50.00    Pack years: 25.00    Types: Cigarettes  . Smokeless tobacco: Never Used  . Tobacco comment: Wants to quit. Using both the patch and gum.  Substance and Sexual Activity  . Alcohol use: Yes    Alcohol/week: 10.0 standard drinks    Types: 10 Glasses of wine per week    Comment: Wine.   . Drug use: No  . Sexual activity: Not on file    Comment: pt. given condoms  Other Topics Concern  . Not on file  Social History Narrative  . Not on file   Social Determinants of Health   Financial Resource Strain:   . Difficulty of Paying Living Expenses: Not on file  Food Insecurity:   . Worried About Charity fundraiser in the Last Year: Not on file  . Ran Out of Food in the Last Year: Not on file  Transportation Needs:   . Lack of Transportation (Medical): Not on file  . Lack of Transportation (Non-Medical): Not on file  Physical Activity:   . Days of Exercise per Week: Not on file  . Minutes of Exercise per Session: Not on file  Stress:   . Feeling of Stress : Not on file  Social Connections:   . Frequency of Communication with Friends and Family: Not on file  . Frequency of Social Gatherings with Friends and Family: Not on file  . Attends Religious Services: Not on file  . Active Member of  Clubs or Organizations: Not on file  . Attends Archivist Meetings: Not on file  . Marital Status: Not on file     Family History:  The patient's ***family history includes Colon cancer in her sister; Diabetes in her mother; Ovarian cancer in her sister.  ROS:   Please see the history of present illness. Otherwise, review of systems is positive for ***.  All other systems are reviewed and otherwise negative.    EKGs/Labs/Other Studies Reviewed:    Studies reviewed are outlined and summarized above. Reports may be copied below with additional information if pertinent.  2D echo 12/21/19  1. Left ventricular ejection fraction, by visual estimation, is >75%. The left ventricle has hyperdynamic function. There is moderately increased left ventricular hypertrophy.  2. Elevated left atrial pressure.  3. Left ventricular diastolic parameters are consistent with Grade I diastolic dysfunction (impaired relaxation).  4. The left ventricle has no regional wall motion  abnormalities.  5. Global right ventricle has normal systolic function.The right ventricular size is normal.  6. Left atrial size was normal.  7. Right atrial size was normal.  8. Moderate mitral annular calcification.  9. The mitral valve is normal in structure. No evidence of mitral valve regurgitation. No evidence of mitral stenosis. 10. The tricuspid valve is normal in structure. 11. The aortic valve has an indeterminant number of cusps. Aortic valve regurgitation is not visualized. Mild to moderate aortic valve stenosis. 12. The pulmonic valve was normal in structure. Pulmonic valve regurgitation is trivial. 13. Mildly elevated pulmonary artery systolic pressure. 14. A pacer wire is visualized. 15. The inferior vena cava is normal in size with greater than 50% respiratory variability, suggesting right atrial pressure of 3 mmHg. 16. Hyperdynamic LV systolic function; moderate LVH; grade 1 diastolic dysfunction; intracavitary gradient of 4.6 m/s; calcified aortic valve with mild to moderate AS (mean gradient 19 mmHg).    EKG:  EKG is ordered today, personally reviewed, demonstrating ***  Recent Labs: No results found for requested labs within last 8760 hours.  Recent Lipid Panel    Component Value Date/Time   CHOL 111 11/10/2017 1417   TRIG 89 11/10/2017 1417   HDL 59 11/10/2017 1417   CHOLHDL 1.9 11/10/2017 1417   VLDL 13 05/15/2017 1646   LDLCALC 35 11/10/2017 1417    PHYSICAL EXAM:    VS:  There were no vitals taken for this visit.  BMI: There is no height or weight on file to calculate BMI.  GEN: Well nourished, well developed, in no acute distress HEENT: normocephalic, atraumatic Neck: no JVD, carotid bruits, or masses Cardiac: ***RRR; no murmurs, rubs, or gallops, no edema  Respiratory:  clear to auscultation bilaterally, normal work of breathing GI: soft, nontender, nondistended, + BS MS: no deformity or atrophy Skin: warm and dry, no rash Neuro:  Alert and  Oriented x 3, Strength and sensation are intact, follows commands Psych: euthymic mood, full affect  Wt Readings from Last 3 Encounters:  04/01/19 127 lb (57.6 kg)  01/20/19 123 lb (55.8 kg)  07/06/18 128 lb (58.1 kg)     ASSESSMENT & PLAN:   1. ***  Disposition: F/u with ***   Medication Adjustments/Labs and Tests Ordered: Current medicines are reviewed at length with the patient today.  Concerns regarding medicines are outlined above. Medication changes, Labs and Tests ordered today are summarized above and listed in the Patient Instructions accessible in Encounters.   Signed, Charlie Pitter, PA-C  12/29/2019 10:37 AM  [facility] [place], [place], [place]  [address] Phone: [phone]; Fax: [phone]

## 2019-12-31 ENCOUNTER — Ambulatory Visit: Payer: Medicare Other | Admitting: Physician Assistant

## 2020-02-04 ENCOUNTER — Other Ambulatory Visit: Payer: Self-pay

## 2020-02-04 ENCOUNTER — Telehealth (INDEPENDENT_AMBULATORY_CARE_PROVIDER_SITE_OTHER): Payer: Medicare Other | Admitting: Internal Medicine

## 2020-02-04 DIAGNOSIS — Z950 Presence of cardiac pacemaker: Secondary | ICD-10-CM | POA: Diagnosis not present

## 2020-02-04 DIAGNOSIS — I495 Sick sinus syndrome: Secondary | ICD-10-CM

## 2020-02-04 DIAGNOSIS — I10 Essential (primary) hypertension: Secondary | ICD-10-CM | POA: Diagnosis not present

## 2020-02-04 DIAGNOSIS — I119 Hypertensive heart disease without heart failure: Secondary | ICD-10-CM

## 2020-02-04 NOTE — Progress Notes (Signed)
Electrophysiology TeleHealth Note  Due to national recommendations of social distancing due to [place] 19, an audio telehealth visit is felt to be most appropriate for this patient at this time.  Verbal consent was obtained by me for the telehealth visit today.  The patient does not have capability for a virtual visit.  A phone visit is therefore required today.   Date:  02/04/2020   ID:  Nicole Mccormick, DOB [birth year], MRN [id number]  Location: patient's home  Provider location:  [place]   Evaluation Performed: Follow-up visit  PCP:  Sonia Side., FNP   Electrophysiologist:  Dr Rayann Heman  Chief Complaint:  Pacemaker follow up  History of Present Illness:    Nicole Mccormick is a 74 y.o. female who presents via telehealth conferencing today.  Since last being seen in our clinic, the patient reports doing very well.  Today, she denies symptoms of palpitations, chest pain, shortness of breath,  lower extremity edema, dizziness, presyncope, or syncope.  The patient is otherwise without complaint today.  The patient denies symptoms of fevers, chills, cough, or new SOB worrisome for COVID 19.  Past Medical History:  Diagnosis Date  . Abnormal Pap smear   . Aortic stenosis   . Arthritis   . Asthma   . AVM (arteriovenous malformation)   . Bradycardia   . Complication of anesthesia    "they have a hard time bringing me back" (11/08/2015)  . Coronary artery disease    a. diffuse CAD of RCA by cath 2016 managed medically.  . Dynamic left ventricular outflow obstruction   . GERD (gastroesophageal reflux disease)    previously on aciphex, discontinued november 2012  because patient asymptomatic, and concern about interference with HIV meds.  May try pepcid in the future if symptoms return  . Heart murmur   . Hepatitis C antibody test positive   . HIV infection ([place])    diagnosed before 2008  . Hypertension   . Hypertensive heart disease   . Iron deficiency anemia    Ferritin = 2 in  november 2012, started on iron supplemenation  . Pacemaker    a. St Jude PPM 01/2018.  . Seizures ([place])   . Varicosities     Past Surgical History:  Procedure Laterality Date  . ABDOMINAL HYSTERECTOMY    . CARDIAC CATHETERIZATION N/A 11/08/2015   Procedure: Left Heart Cath and Coronary Angiography;  Surgeon: Jettie Booze, MD;  Location: [place] [facility];  Service: Cardiovascular;  Laterality: N/A;  . COLONOSCOPY  10/13/11   small adenoma, anal condyloma  . COLONOSCOPY  10/13/2011   Procedure: COLONOSCOPY;  Surgeon: Gatha Mayer, MD;  Location: [place];  Service: Endoscopy;  Laterality: N/A;  . COLONOSCOPY N/A 09/14/2014   Procedure: COLONOSCOPY;  Surgeon: Arta Silence, MD;  Location: [facility] ENDOSCOPY;  Service: Endoscopy;  Laterality: N/A;  . ESOPHAGOGASTRODUODENOSCOPY  10/13/11   small hiatal hernia  . ESOPHAGOGASTRODUODENOSCOPY  10/13/2011   Procedure: ESOPHAGOGASTRODUODENOSCOPY (EGD);  Surgeon: Gatha Mayer, MD;  Location: [facility] ENDOSCOPY;  Service: Endoscopy;  Laterality: N/A;  . ESOPHAGOGASTRODUODENOSCOPY N/A 09/14/2014   Procedure: ESOPHAGOGASTRODUODENOSCOPY (EGD);  Surgeon: Arta Silence, MD;  Location: [facility] ENDOSCOPY;  Service: Endoscopy;  Laterality: N/A;  . GIVENS CAPSULE STUDY N/A 09/14/2014   Procedure: GIVENS CAPSULE STUDY;  Surgeon: Arta Silence, MD;  Location: [facility] ENDOSCOPY;  Service: Endoscopy;  Laterality: N/A;  . PACEMAKER IMPLANT N/A 02/05/2018   Procedure: PACEMAKER IMPLANT;  Surgeon: Thompson Grayer, MD;  Location: [facility]  INVASIVE CV LAB;  Service: Cardiovascular;  Laterality: N/A;    Current Outpatient Medications  Medication Sig Dispense Refill  . acyclovir (ZOVIRAX) 400 MG tablet Take 1 tablet (400 mg total) by mouth daily as needed (for flare ups). 30 tablet 3  . albuterol (PROAIR HFA) 108 (90 Base) MCG/ACT inhaler INHALE 2 PUFFS BY MOUTH EVERY 4 HOURS IF NEEDED FOR WHEEZING OR SHORTNESS OF BREATH 8.5 g 0  . albuterol (PROVENTIL) (2.5 MG/3ML) 0.083% nebulizer  solution Take 3 mLs (2.5 mg total) by nebulization every 6 (six) hours as needed for wheezing or shortness of breath. 75 mL 3  . amLODipine (NORVASC) 10 MG tablet TAKE 1 TABLET BY MOUTH EVERY DAY 90 tablet 0  . Ascorbic Acid (VITAMIN C) 1000 MG tablet Take 1,000 mg by mouth daily.    Marland Kitchen atorvastatin (LIPITOR) 40 MG tablet take 1 tablet by mouth once daily AT 6 PM 90 tablet 0  . BIKTARVY 50-200-25 MG TABS tablet TAKE 1 TABLET BY MOUTH EVERY DAY 90 tablet 3  . Cetirizine HCl (ZYRTEC ALLERGY) 10 MG TBDP Take 1 tablet by mouth daily. 90 tablet 3  . cholecalciferol (VITAMIN D) 1000 units tablet Take 1,000 Units by mouth daily.    . cyclobenzaprine (FLEXERIL) 5 MG tablet TAKE 1 TABLET BY MOUTH TWICE DAILY AS NEEDED FOR MUSCLE SPASM 30 tablet 0  . diazepam (VALIUM) 2 MG tablet take 1 tablet by mouth every 6 hours if needed for anxiety 30 tablet 0  . escitalopram (LEXAPRO) 10 MG tablet Take 1 tablet by mouth daily as needed for anxiety.    . famotidine (PEPCID) 20 MG tablet Take 1 tablet (20 mg total) by mouth 2 (two) times daily. 60 tablet 3  . ferrous sulfate 325 (65 FE) MG EC tablet Take 325 mg by mouth daily.    . Flaxseed, Linseed, (FLAX SEED OIL) 1000 MG CAPS Take 1,000 mg by mouth daily.    Marland Kitchen gabapentin (NEURONTIN) 600 MG tablet TAKE 1 TABLET BY MOUTH THREE TIMES DAILY 90 tablet 1  . HYDROcodone-acetaminophen (NORCO) 5-325 MG tablet Take 1 tablet by mouth every 6 (six) hours as needed for moderate pain. 6 tablet 0  . isosorbide mononitrate (IMDUR) 60 MG 24 hr tablet Take 1 tablet (60 mg total) by mouth daily. 90 tablet 1  . lisinopril (ZESTRIL) 10 MG tablet Take 10 mg by mouth daily.    . Omega-3 Fatty Acids (FISH OIL) 1000 MG CPDR Take 1,000 mg by mouth daily.    . ondansetron (ZOFRAN-ODT) 4 MG disintegrating tablet Take 1 tablet (4 mg total) by mouth daily as needed for nausea or vomiting. 20 tablet 3  . pantoprazole (PROTONIX) 40 MG tablet Take 1 tablet (40 mg total) by mouth daily. 90 tablet 0    . RA ASPIRIN EC ADULT LOW ST 81 MG EC tablet Take 1 tablet (81 mg total) by mouth daily. 30 tablet 5  . spironolactone (ALDACTONE) 25 MG tablet TAKE 1 TABLET BY MOUTH EVERY DAY 30 tablet 0  . triamcinolone cream (KENALOG) 0.1 % Apply 1 application topically 2 (two) times daily. 30 g 0  . vitamin A 10000 UNIT capsule Take 10,000 Units by mouth daily.    . vitamin B-12 (CYANOCOBALAMIN) 1000 MCG tablet Take 1,000 mcg by mouth daily.    . carvedilol (COREG) 3.125 MG tablet Take 1 tablet (3.125 mg total) by mouth 2 (two) times daily. 180 tablet 3   No current facility-administered medications for this visit.  Allergies:   Penicillins and Avelox [moxifloxacin hcl in nacl]   Social History:  The patient  reports that she has been smoking cigarettes. She has a 25.00 pack-year smoking history. She has never used smokeless tobacco. She reports current alcohol use of about 10.0 standard drinks of alcohol per week. She reports that she does not use drugs.   Family History:  The patient's family history includes Colon cancer in her sister; Diabetes in her mother; Ovarian cancer in her sister.   ROS:  Please see the history of present illness.   All other systems are personally reviewed and negative.    Exam:    Vital Signs:  There were no vitals taken for this visit.  Well sounding, alert and conversant   Labs/Other Tests and Data Reviewed:    Recent Labs: No results found for requested labs within last 8760 hours.   Wt Readings from Last 3 Encounters:  04/01/19 127 lb (57.6 kg)  01/20/19 123 lb (55.8 kg)  07/06/18 128 lb (58.1 kg)     Last device remote is reviewed from [place] PDF which reveals normal device function    ASSESSMENT & PLAN:    1.  Symptomatic sinus bradycardia Normal device function by recent remote See PaceArt report  2.  HTN Stable No change required today   Follow-up:  Remotes, EP NP in a year   Patient Risk:  after full review of this patients clinical  status, I feel that they are at moderate risk at this time.  Today, I have spent 15 minutes with the patient with telehealth technology discussing arrhythmia management .    Army Fossa, MD  02/04/2020 10:16 AM     [facility] [address] [place] [address] [phone] (office) [phone] (fax)

## 2020-02-23 ENCOUNTER — Ambulatory Visit (INDEPENDENT_AMBULATORY_CARE_PROVIDER_SITE_OTHER): Payer: Medicare Other | Admitting: *Deleted

## 2020-02-23 DIAGNOSIS — I495 Sick sinus syndrome: Secondary | ICD-10-CM

## 2020-02-23 LAB — CUP PACEART REMOTE DEVICE CHECK
Battery Remaining Longevity: 122 mo
Battery Remaining Percentage: 95.5 %
Battery Voltage: 3.01 V
Brady Statistic AP VP Percent: 1.4 %
Brady Statistic AP VS Percent: 95 %
Brady Statistic AS VP Percent: 1 %
Brady Statistic AS VS Percent: 3.8 %
Brady Statistic RA Percent Paced: 95 %
Brady Statistic RV Percent Paced: 1.4 %
Date Time Interrogation Session: 20210317020023
Implantable Lead Implant Date: 20190228
Implantable Lead Implant Date: 20190228
Implantable Lead Location: 753859
Implantable Lead Location: 753860
Implantable Pulse Generator Implant Date: 20190228
Lead Channel Impedance Value: 350 Ohm
Lead Channel Impedance Value: 460 Ohm
Lead Channel Pacing Threshold Amplitude: 0.375 V
Lead Channel Pacing Threshold Amplitude: 0.625 V
Lead Channel Pacing Threshold Pulse Width: 0.5 ms
Lead Channel Pacing Threshold Pulse Width: 0.5 ms
Lead Channel Sensing Intrinsic Amplitude: 3.1 mV
Lead Channel Sensing Intrinsic Amplitude: 9.2 mV
Lead Channel Setting Pacing Amplitude: 0.875
Lead Channel Setting Pacing Amplitude: 1.375
Lead Channel Setting Pacing Pulse Width: 0.5 ms
Lead Channel Setting Sensing Sensitivity: 2 mV
Pulse Gen Model: 2272
Pulse Gen Serial Number: 8996997

## 2020-02-23 NOTE — Progress Notes (Signed)
PPM Remote  

## 2020-03-22 ENCOUNTER — Other Ambulatory Visit: Payer: Self-pay | Admitting: Physician Assistant

## 2020-05-24 ENCOUNTER — Ambulatory Visit (INDEPENDENT_AMBULATORY_CARE_PROVIDER_SITE_OTHER): Payer: Medicare Other | Admitting: *Deleted

## 2020-05-24 DIAGNOSIS — R001 Bradycardia, unspecified: Secondary | ICD-10-CM

## 2020-05-24 LAB — CUP PACEART REMOTE DEVICE CHECK
Battery Remaining Longevity: 121 mo
Battery Remaining Percentage: 95.5 %
Battery Voltage: 3.01 V
Brady Statistic AP VP Percent: 1.5 %
Brady Statistic AP VS Percent: 92 %
Brady Statistic AS VP Percent: 1 %
Brady Statistic AS VS Percent: 6.4 %
Brady Statistic RA Percent Paced: 92 %
Brady Statistic RV Percent Paced: 1.5 %
Date Time Interrogation Session: 20210616020013
Implantable Lead Implant Date: 20190228
Implantable Lead Implant Date: 20190228
Implantable Lead Location: 753859
Implantable Lead Location: 753860
Implantable Pulse Generator Implant Date: 20190228
Lead Channel Impedance Value: 330 Ohm
Lead Channel Impedance Value: 440 Ohm
Lead Channel Pacing Threshold Amplitude: 0.5 V
Lead Channel Pacing Threshold Amplitude: 0.5 V
Lead Channel Pacing Threshold Pulse Width: 0.5 ms
Lead Channel Pacing Threshold Pulse Width: 0.5 ms
Lead Channel Sensing Intrinsic Amplitude: 2 mV
Lead Channel Sensing Intrinsic Amplitude: 8.4 mV
Lead Channel Setting Pacing Amplitude: 0.75 V
Lead Channel Setting Pacing Amplitude: 1.5 V
Lead Channel Setting Pacing Pulse Width: 0.5 ms
Lead Channel Setting Sensing Sensitivity: 2 mV
Pulse Gen Model: 2272
Pulse Gen Serial Number: 8996997

## 2020-05-25 NOTE — Progress Notes (Signed)
Remote pacemaker transmission.   

## 2020-06-03 ENCOUNTER — Inpatient Hospital Stay (HOSPITAL_COMMUNITY)
Admission: AD | Admit: 2020-06-03 | Discharge: 2020-06-05 | DRG: 377 | Disposition: A | Discharge status: Home Health | Payer: Medicare Other | Attending: Internal Medicine | Admitting: Internal Medicine

## 2020-06-03 ENCOUNTER — Inpatient Hospital Stay (HOSPITAL_COMMUNITY): Payer: Medicare Other

## 2020-06-03 ENCOUNTER — Emergency Department (HOSPITAL_COMMUNITY): Payer: Medicare Other

## 2020-06-03 ENCOUNTER — Encounter (HOSPITAL_COMMUNITY): Payer: Self-pay | Admitting: Emergency Medicine

## 2020-06-03 ENCOUNTER — Other Ambulatory Visit: Payer: Self-pay

## 2020-06-03 DIAGNOSIS — I495 Sick sinus syndrome: Secondary | ICD-10-CM | POA: Diagnosis present

## 2020-06-03 DIAGNOSIS — Z79899 Other long term (current) drug therapy: Secondary | ICD-10-CM

## 2020-06-03 DIAGNOSIS — D125 Benign neoplasm of sigmoid colon: Secondary | ICD-10-CM | POA: Diagnosis present

## 2020-06-03 DIAGNOSIS — F17210 Nicotine dependence, cigarettes, uncomplicated: Secondary | ICD-10-CM | POA: Diagnosis present

## 2020-06-03 DIAGNOSIS — Z21 Asymptomatic human immunodeficiency virus [HIV] infection status: Secondary | ICD-10-CM | POA: Diagnosis not present

## 2020-06-03 DIAGNOSIS — Z90710 Acquired absence of both cervix and uterus: Secondary | ICD-10-CM

## 2020-06-03 DIAGNOSIS — D62 Acute posthemorrhagic anemia: Secondary | ICD-10-CM | POA: Diagnosis present

## 2020-06-03 DIAGNOSIS — F410 Panic disorder [episodic paroxysmal anxiety] without agoraphobia: Secondary | ICD-10-CM | POA: Diagnosis present

## 2020-06-03 DIAGNOSIS — K648 Other hemorrhoids: Secondary | ICD-10-CM | POA: Diagnosis present

## 2020-06-03 DIAGNOSIS — D72819 Decreased white blood cell count, unspecified: Secondary | ICD-10-CM | POA: Diagnosis present

## 2020-06-03 DIAGNOSIS — J441 Chronic obstructive pulmonary disease with (acute) exacerbation: Secondary | ICD-10-CM | POA: Diagnosis present

## 2020-06-03 DIAGNOSIS — I119 Hypertensive heart disease without heart failure: Secondary | ICD-10-CM | POA: Diagnosis present

## 2020-06-03 DIAGNOSIS — K449 Diaphragmatic hernia without obstruction or gangrene: Secondary | ICD-10-CM | POA: Diagnosis present

## 2020-06-03 DIAGNOSIS — K219 Gastro-esophageal reflux disease without esophagitis: Secondary | ICD-10-CM | POA: Diagnosis present

## 2020-06-03 DIAGNOSIS — Z833 Family history of diabetes mellitus: Secondary | ICD-10-CM

## 2020-06-03 DIAGNOSIS — R0602 Shortness of breath: Secondary | ICD-10-CM

## 2020-06-03 DIAGNOSIS — B1920 Unspecified viral hepatitis C without hepatic coma: Secondary | ICD-10-CM | POA: Diagnosis present

## 2020-06-03 DIAGNOSIS — D122 Benign neoplasm of ascending colon: Secondary | ICD-10-CM | POA: Diagnosis present

## 2020-06-03 DIAGNOSIS — J9600 Acute respiratory failure, unspecified whether with hypoxia or hypercapnia: Secondary | ICD-10-CM | POA: Diagnosis present

## 2020-06-03 DIAGNOSIS — I2510 Atherosclerotic heart disease of native coronary artery without angina pectoris: Secondary | ICD-10-CM | POA: Diagnosis present

## 2020-06-03 DIAGNOSIS — K5521 Angiodysplasia of colon with hemorrhage: Secondary | ICD-10-CM | POA: Diagnosis present

## 2020-06-03 DIAGNOSIS — D509 Iron deficiency anemia, unspecified: Secondary | ICD-10-CM | POA: Diagnosis not present

## 2020-06-03 DIAGNOSIS — Z880 Allergy status to penicillin: Secondary | ICD-10-CM | POA: Diagnosis not present

## 2020-06-03 DIAGNOSIS — Z7982 Long term (current) use of aspirin: Secondary | ICD-10-CM

## 2020-06-03 DIAGNOSIS — Z950 Presence of cardiac pacemaker: Secondary | ICD-10-CM | POA: Diagnosis not present

## 2020-06-03 DIAGNOSIS — Z888 Allergy status to other drugs, medicaments and biological substances status: Secondary | ICD-10-CM | POA: Diagnosis not present

## 2020-06-03 DIAGNOSIS — R0600 Dyspnea, unspecified: Secondary | ICD-10-CM

## 2020-06-03 DIAGNOSIS — I10 Essential (primary) hypertension: Secondary | ICD-10-CM | POA: Diagnosis not present

## 2020-06-03 DIAGNOSIS — B20 Human immunodeficiency virus [HIV] disease: Secondary | ICD-10-CM | POA: Diagnosis present

## 2020-06-03 DIAGNOSIS — Z8041 Family history of malignant neoplasm of ovary: Secondary | ICD-10-CM | POA: Diagnosis not present

## 2020-06-03 DIAGNOSIS — D649 Anemia, unspecified: Secondary | ICD-10-CM | POA: Diagnosis not present

## 2020-06-03 DIAGNOSIS — D500 Iron deficiency anemia secondary to blood loss (chronic): Secondary | ICD-10-CM | POA: Diagnosis not present

## 2020-06-03 DIAGNOSIS — Z800 Family history of malignant neoplasm of digestive organs: Secondary | ICD-10-CM | POA: Diagnosis not present

## 2020-06-03 DIAGNOSIS — Z20822 Contact with and (suspected) exposure to covid-19: Secondary | ICD-10-CM | POA: Diagnosis present

## 2020-06-03 DIAGNOSIS — K922 Gastrointestinal hemorrhage, unspecified: Secondary | ICD-10-CM | POA: Diagnosis not present

## 2020-06-03 LAB — URINALYSIS, COMPLETE (UACMP) WITH MICROSCOPIC
Bilirubin Urine: NEGATIVE
Glucose, UA: NEGATIVE mg/dL
Ketones, ur: NEGATIVE mg/dL
Leukocytes,Ua: NEGATIVE
Nitrite: NEGATIVE
Protein, ur: 30 mg/dL — AB
Specific Gravity, Urine: 1.025 (ref 1.005–1.030)
pH: 5.5 (ref 5.0–8.0)

## 2020-06-03 LAB — CBC WITH DIFFERENTIAL/PLATELET
Abs Immature Granulocytes: 0 10*3/uL (ref 0.00–0.07)
Basophils Absolute: 0.1 10*3/uL (ref 0.0–0.1)
Basophils Relative: 1 %
Eosinophils Absolute: 0 10*3/uL (ref 0.0–0.5)
Eosinophils Relative: 0 %
HCT: 30.1 % — ABNORMAL LOW (ref 36.0–46.0)
Hemoglobin: 9.2 g/dL — ABNORMAL LOW (ref 12.0–15.0)
Lymphocytes Relative: 8 %
Lymphs Abs: 0.4 10*3/uL — ABNORMAL LOW (ref 0.7–4.0)
MCH: 26.4 pg (ref 26.0–34.0)
MCHC: 30.6 g/dL (ref 30.0–36.0)
MCV: 86.5 fL (ref 80.0–100.0)
Monocytes Absolute: 0 10*3/uL — ABNORMAL LOW (ref 0.1–1.0)
Monocytes Relative: 0 %
Neutro Abs: 4.6 10*3/uL (ref 1.7–7.7)
Neutrophils Relative %: 91 %
Platelets: 218 10*3/uL (ref 150–400)
RBC: 3.48 MIL/uL — ABNORMAL LOW (ref 3.87–5.11)
RDW: 16 % — ABNORMAL HIGH (ref 11.5–15.5)
WBC: 5.1 10*3/uL (ref 4.0–10.5)
nRBC: 0 % (ref 0.0–0.2)
nRBC: 3 /100 WBC — ABNORMAL HIGH

## 2020-06-03 LAB — CBC
HCT: 19.3 % — ABNORMAL LOW (ref 36.0–46.0)
Hemoglobin: 5 g/dL — CL (ref 12.0–15.0)
MCH: 22.6 pg — ABNORMAL LOW (ref 26.0–34.0)
MCHC: 25.9 g/dL — ABNORMAL LOW (ref 30.0–36.0)
MCV: 87.3 fL (ref 80.0–100.0)
Platelets: 208 10*3/uL (ref 150–400)
RBC: 2.21 MIL/uL — ABNORMAL LOW (ref 3.87–5.11)
RDW: 17.7 % — ABNORMAL HIGH (ref 11.5–15.5)
WBC: 4 10*3/uL (ref 4.0–10.5)
nRBC: 0 % (ref 0.0–0.2)

## 2020-06-03 LAB — SARS CORONAVIRUS 2 BY RT PCR (HOSPITAL ORDER, PERFORMED IN ~~LOC~~ HOSPITAL LAB): SARS Coronavirus 2: NEGATIVE

## 2020-06-03 LAB — BLOOD GAS, ARTERIAL
Acid-base deficit: 1.2 mmol/L (ref 0.0–2.0)
Bicarbonate: 23.6 mmol/L (ref 20.0–28.0)
Drawn by: 51185
FIO2: 100
O2 Saturation: 99.9 %
Patient temperature: 37
pCO2 arterial: 44 mmHg (ref 32.0–48.0)
pH, Arterial: 7.349 — ABNORMAL LOW (ref 7.350–7.450)
pO2, Arterial: 231 mmHg — ABNORMAL HIGH (ref 83.0–108.0)

## 2020-06-03 LAB — BASIC METABOLIC PANEL
Anion gap: 9 (ref 5–15)
BUN: 12 mg/dL (ref 8–23)
CO2: 25 mmol/L (ref 22–32)
Calcium: 8.9 mg/dL (ref 8.9–10.3)
Chloride: 104 mmol/L (ref 98–111)
Creatinine, Ser: 0.8 mg/dL (ref 0.44–1.00)
GFR calc Af Amer: >60 mL/min (ref >60)
GFR calc non Af Amer: >60 mL/min (ref >60)
Glucose, Bld: 104 mg/dL — ABNORMAL HIGH (ref 70–99)
Potassium: 3.7 mmol/L (ref 3.5–5.1)
Sodium: 138 mmol/L (ref 135–145)

## 2020-06-03 LAB — HEPATIC FUNCTION PANEL
ALT: 15 U/L (ref 0–44)
AST: 19 U/L (ref 15–41)
Albumin: 3.3 g/dL — ABNORMAL LOW (ref 3.5–5.0)
Alkaline Phosphatase: 55 U/L (ref 38–126)
Bilirubin, Direct: <0.1 mg/dL (ref 0.0–0.2)
Total Bilirubin: 0.4 mg/dL (ref 0.3–1.2)
Total Protein: 6.3 g/dL — ABNORMAL LOW (ref 6.5–8.1)

## 2020-06-03 LAB — TROPONIN I (HIGH SENSITIVITY)
Troponin I (High Sensitivity): 12 ng/L (ref <18)
Troponin I (High Sensitivity): 13 ng/L (ref <18)
Troponin I (High Sensitivity): 13 ng/L (ref <18)

## 2020-06-03 LAB — FERRITIN: Ferritin: 6 ng/mL — ABNORMAL LOW (ref 11–307)

## 2020-06-03 LAB — PROTIME-INR
INR: 1.1 (ref 0.8–1.2)
Prothrombin Time: 13.7 seconds (ref 11.4–15.2)

## 2020-06-03 LAB — PREPARE RBC (CROSSMATCH)

## 2020-06-03 LAB — IRON AND TIBC
Iron: 24 ug/dL — ABNORMAL LOW (ref 28–170)
Saturation Ratios: 5 % — ABNORMAL LOW (ref 10.4–31.8)
TIBC: 524 ug/dL — ABNORMAL HIGH (ref 250–450)
UIBC: 500 ug/dL

## 2020-06-03 LAB — POC OCCULT BLOOD, ED: Fecal Occult Bld: POSITIVE — AB

## 2020-06-03 MED ORDER — ISOSORBIDE MONONITRATE ER 30 MG PO TB24: 60.0000 mg | ORAL_TABLET | Freq: Every day | ORAL | Status: DC

## 2020-06-03 MED ORDER — METHYLPREDNISOLONE SODIUM SUCC 40 MG IJ SOLR
40.0000 mg | Freq: Four times a day (QID) | INTRAMUSCULAR | Status: AC
  Administered 2020-06-03 – 2020-06-04 (×4): 40 mg via INTRAVENOUS
  Filled 2020-06-03 (×4): qty 1

## 2020-06-03 MED ORDER — FUROSEMIDE 10 MG/ML IJ SOLN
40.0000 mg | Freq: Once | INTRAMUSCULAR | Status: AC
  Administered 2020-06-03: 40 mg via INTRAVENOUS
  Filled 2020-06-03: qty 4

## 2020-06-03 MED ORDER — HYDRALAZINE HCL 20 MG/ML IJ SOLN
5.0000 mg | Freq: Once | INTRAMUSCULAR | Status: AC
  Administered 2020-06-03: 5 mg via INTRAVENOUS
  Filled 2020-06-03: qty 1

## 2020-06-03 MED ORDER — SODIUM CHLORIDE 0.9 % IV SOLN
500.0000 mg | INTRAVENOUS | Status: AC
  Administered 2020-06-03: 500 mg via INTRAVENOUS
  Filled 2020-06-03: qty 500

## 2020-06-03 MED ORDER — NICOTINE 14 MG/24HR TD PT24
14.0000 mg | MEDICATED_PATCH | Freq: Every day | TRANSDERMAL | Status: DC
  Administered 2020-06-03 – 2020-06-05 (×3): 14 mg via TRANSDERMAL
  Filled 2020-06-03 (×3): qty 1

## 2020-06-03 MED ORDER — UMECLIDINIUM BROMIDE 62.5 MCG/INH IN AEPB
1.0000 | INHALATION_SPRAY | Freq: Every day | RESPIRATORY_TRACT | Status: DC
  Filled 2020-06-03: qty 7

## 2020-06-03 MED ORDER — AMLODIPINE BESYLATE 10 MG PO TABS
10.0000 mg | ORAL_TABLET | Freq: Every day | ORAL | Status: DC
  Administered 2020-06-03 – 2020-06-05 (×3): 10 mg via ORAL
  Filled 2020-06-03 (×3): qty 1

## 2020-06-03 MED ORDER — AMLODIPINE BESYLATE 5 MG PO TABS: 10.0000 mg | ORAL_TABLET | Freq: Every day | ORAL | Status: DC

## 2020-06-03 MED ORDER — BICTEGRAVIR-EMTRICITAB-TENOFOV 50-200-25 MG PO TABS
1.0000 | ORAL_TABLET | Freq: Every day | ORAL | Status: DC
  Administered 2020-06-03 – 2020-06-05 (×3): 1 via ORAL
  Filled 2020-06-03 (×4): qty 1

## 2020-06-03 MED ORDER — ALBUTEROL SULFATE HFA 108 (90 BASE) MCG/ACT IN AERS
2.0000 | INHALATION_SPRAY | Freq: Once | RESPIRATORY_TRACT | Status: AC
  Administered 2020-06-03: 2 via RESPIRATORY_TRACT
  Filled 2020-06-03: qty 6.7

## 2020-06-03 MED ORDER — PREDNISONE 20 MG PO TABS: 40.0000 mg | ORAL_TABLET | Freq: Every day | ORAL | Status: DC

## 2020-06-03 MED ORDER — AZITHROMYCIN 250 MG PO TABS
500.0000 mg | ORAL_TABLET | Freq: Every day | ORAL | Status: DC
  Administered 2020-06-04 – 2020-06-05 (×2): 500 mg via ORAL
  Filled 2020-06-03 (×2): qty 2

## 2020-06-03 MED ORDER — SPIRONOLACTONE 25 MG PO TABS
25.0000 mg | ORAL_TABLET | Freq: Every day | ORAL | Status: DC
  Administered 2020-06-03 – 2020-06-05 (×3): 25 mg via ORAL
  Filled 2020-06-03 (×4): qty 1

## 2020-06-03 MED ORDER — ISOSORBIDE MONONITRATE ER 60 MG PO TB24
60.0000 mg | ORAL_TABLET | Freq: Every day | ORAL | Status: DC
  Administered 2020-06-03 – 2020-06-05 (×3): 60 mg via ORAL
  Filled 2020-06-03 (×3): qty 1

## 2020-06-03 MED ORDER — HYDROCHLOROTHIAZIDE 25 MG PO TABS: 12.5000 mg | ORAL_TABLET | Freq: Every day | ORAL | Status: DC

## 2020-06-03 MED ORDER — SODIUM CHLORIDE 0.9 % IV SOLN
10.0000 mL/h | Freq: Once | INTRAVENOUS | Status: AC
  Administered 2020-06-03: 10 mL/h via INTRAVENOUS

## 2020-06-03 MED ORDER — LISINOPRIL 40 MG PO TABS
40.0000 mg | ORAL_TABLET | Freq: Every day | ORAL | Status: DC
  Administered 2020-06-03 – 2020-06-05 (×3): 40 mg via ORAL
  Filled 2020-06-03 (×3): qty 1

## 2020-06-03 MED ORDER — BICTEGRAVIR-EMTRICITAB-TENOFOV 50-200-25 MG PO TABS: 1.0000 | ORAL_TABLET | Freq: Every day | ORAL | Status: DC

## 2020-06-03 MED ORDER — SPIRONOLACTONE 25 MG PO TABS: 25.0000 mg | ORAL_TABLET | Freq: Every day | ORAL | Status: DC

## 2020-06-03 MED ORDER — ATORVASTATIN CALCIUM 40 MG PO TABS
40.0000 mg | ORAL_TABLET | Freq: Every day | ORAL | Status: DC
  Administered 2020-06-03 – 2020-06-05 (×3): 40 mg via ORAL
  Filled 2020-06-03 (×3): qty 1

## 2020-06-03 MED ORDER — PANTOPRAZOLE SODIUM 40 MG IV SOLR
40.0000 mg | Freq: Once | INTRAVENOUS | Status: AC
  Administered 2020-06-03: 40 mg via INTRAVENOUS
  Filled 2020-06-03: qty 40

## 2020-06-03 MED ORDER — SODIUM CHLORIDE 0.9% FLUSH: 3.0000 mL | Freq: Once | INTRAVENOUS | Status: DC

## 2020-06-03 MED ORDER — LISINOPRIL 20 MG PO TABS: 40.0000 mg | ORAL_TABLET | Freq: Every day | ORAL | Status: DC

## 2020-06-03 MED ORDER — IPRATROPIUM-ALBUTEROL 0.5-2.5 (3) MG/3ML IN SOLN
3.0000 mL | Freq: Three times a day (TID) | RESPIRATORY_TRACT | Status: DC
  Administered 2020-06-04 – 2020-06-05 (×5): 3 mL via RESPIRATORY_TRACT
  Filled 2020-06-03 (×5): qty 3

## 2020-06-03 MED ORDER — LACTATED RINGERS IV SOLN: INTRAVENOUS | Status: DC

## 2020-06-03 MED ORDER — IPRATROPIUM-ALBUTEROL 0.5-2.5 (3) MG/3ML IN SOLN
3.0000 mL | Freq: Four times a day (QID) | RESPIRATORY_TRACT | Status: DC
  Administered 2020-06-03 (×3): 3 mL via RESPIRATORY_TRACT
  Filled 2020-06-03 (×3): qty 3

## 2020-06-03 NOTE — ED Triage Notes (Signed)
Pt reports SOB at rest x 2-3 days.  Denies pain.  Reports mild swelling to bilateral feet.

## 2020-06-03 NOTE — ED Notes (Signed)
Got patient into a gown on the monitor patient is resting with call bell in reach and family at bedside got patient a warm blanket 

## 2020-06-03 NOTE — Significant Event (Addendum)
Rapid Response Event Note  Overview: Time Called: 3159 Arrival Time: 1450 Event Type: Respiratory Acute onset increased work of breathing.  Initial Focused Assessment: Pt lying in bed, restless, anxious. Pt has accessory muscle use and is tachypneic. Pt is able to follow commands and answers orientation questions appropriately. Lung sounds are diminished. Skin is normal in color, warm, moist. Abdomen is soft. Extremities are warm.   VS: BP 219/79, HR 60, RR 28, SpO2 100% on 100% NRB  Interventions: -ABG 7.349/ 44/ 231/ 23.6 -BiPAP 16/8 40% -CXR -EKG -Trop: 13 -IV Lasix -Transfusion reaction order set placed  Plan of Care (if not transferred): -Pt appears much more comfortable and work of breathing has significantly improved since being placed on BiPAP -Transfer to progressive care unit for additional monitoring  Call rapid response for additional needs.   Event Summary: Name of Physician Notified: Dr. Truman Hayward at  (MD at bedside upon my arrival.) Outcome: Transferred (Comment) Event End Time: [place]

## 2020-06-03 NOTE — H&P (Signed)
Date: 06/03/2020               Patient Name:  Nicole Mccormick MRN: [id number]  DOB: [birth year] Age / Sex: 74 y.o., female   PCP: Sonia Side., FNP         Medical Service: Internal Medicine Teaching Service         Attending Physician: Dr. Sid Falcon, MD    First Contact: Marva Panda, MD, [place] Pager: [place] [phone])  Second Contact: Truman Hayward, MD, Vonna Kotyk Pager: [place] [phone])       After Hours (After 5p/  First Contact Pager: [phone]  weekends / holidays): Second Contact Pager: [phone]   Chief Complaint: dyspnea  History of Present Illness: Nicole Mccormick is a  74 y.o. female w/ PMH significant for CAD, sick sinus syndrome s/p pacemaker placement, iron deficiency anemia, HIV on Biktarvy, hepatitis C presenting with dyspnea.   She was in her usual state of health until couple days ago when she began to have shortness of breath with exertion. Over the last 48 hours, her dyspnea worsened with shortness of breath at rest. She also developed productive cough with clear sputum. She also began to use her husband's oxygen with mild improvement in her dyspnea. She also used her rescue inhaler with some improvement. She endorses central substernal chest pain without radiation with coughing. Notes generalized fatigue and lightheadedness. She denies any fevers, chills, sick contact. She mentions that she has received her COVID vaccination and continues to wear a mask in public. She notes some ongoing nausea but denies any vomiting, abdominal pain, diarrhea/constipation, hematochezia or dark stools.   ED course: Patient presented with progressively worsening dyspnea and lightheadedness. Patient given albuterol treatment for dyspnea. Noted to have Hb of 5 for which she was given 2u pRBC. Given her history of small bowel AVMs with prior GI bleeds and FOBT positive stools, GI consulted. Patient to be admitted to internal medicine for further management.    Meds:  Current Meds  Medication Sig  .  acyclovir (ZOVIRAX) 400 MG tablet Take 1 tablet (400 mg total) by mouth daily as needed (for flare ups).  Marland Kitchen albuterol (PROAIR HFA) 108 (90 Base) MCG/ACT inhaler INHALE 2 PUFFS BY MOUTH EVERY 4 HOURS IF NEEDED FOR WHEEZING OR SHORTNESS OF BREATH (Patient taking differently: Inhale 2 puffs into the lungs every 4 (four) hours as needed. INHALE 2 PUFFS BY MOUTH EVERY 4 HOURS IF NEEDED FOR WHEEZING OR SHORTNESS OF BREATH)  . amLODipine (NORVASC) 10 MG tablet TAKE 1 TABLET BY MOUTH EVERY DAY (Patient taking differently: Take 10 mg by mouth daily. )  . Ascorbic Acid (VITAMIN C) 1000 MG tablet Take 1,000 mg by mouth daily.  Marland Kitchen atorvastatin (LIPITOR) 40 MG tablet take 1 tablet by mouth once daily AT 6 PM (Patient taking differently: Take 40 mg by mouth daily. take 1 tablet by mouth once daily AT 6 PM)  . BIKTARVY 50-200-25 MG TABS tablet TAKE 1 TABLET BY MOUTH EVERY DAY (Patient taking differently: Take 1 tablet by mouth daily. )  . carvedilol (COREG) 3.125 MG tablet TAKE 1 TABLET(3.125 MG) BY MOUTH TWICE DAILY (Patient taking differently: Take 3.125 mg by mouth 2 (two) times daily with a meal. )  . Cetirizine HCl (ZYRTEC ALLERGY) 10 MG TBDP Take 1 tablet by mouth daily.  . cholecalciferol (VITAMIN D) 1000 units tablet Take 1,000 Units by mouth daily.  Marland Kitchen dextromethorphan (DELSYM) 30 MG/5ML liquid Take 30 mg by mouth as needed  for cough.  . diazepam (VALIUM) 2 MG tablet take 1 tablet by mouth every 6 hours if needed for anxiety  . famotidine (PEPCID) 20 MG tablet Take 1 tablet (20 mg total) by mouth 2 (two) times daily.  . ferrous sulfate 325 (65 FE) MG EC tablet Take 325 mg by mouth daily.  . Flaxseed, Linseed, (FLAX SEED OIL) 1000 MG CAPS Take 1,000 mg by mouth daily.  Marland Kitchen gabapentin (NEURONTIN) 600 MG tablet TAKE 1 TABLET BY MOUTH THREE TIMES DAILY (Patient taking differently: Take 600 mg by mouth 3 (three) times daily. )  . hydrochlorothiazide (HYDRODIURIL) 12.5 MG tablet Take 12.5 mg by mouth daily.  Marland Kitchen  HYDROcodone-acetaminophen (NORCO) 5-325 MG tablet Take 1 tablet by mouth every 6 (six) hours as needed for moderate pain.  . isosorbide mononitrate (IMDUR) 60 MG 24 hr tablet Take 1 tablet (60 mg total) by mouth daily.  Marland Kitchen lisinopril (ZESTRIL) 40 MG tablet Take 40 mg by mouth daily.  . Omega-3 Fatty Acids (FISH OIL) 1000 MG CPDR Take 1,000 mg by mouth daily.  . ondansetron (ZOFRAN-ODT) 4 MG disintegrating tablet Take 1 tablet (4 mg total) by mouth daily as needed for nausea or vomiting.  . pantoprazole (PROTONIX) 40 MG tablet Take 1 tablet (40 mg total) by mouth daily.  Marland Kitchen RA ASPIRIN EC ADULT LOW ST 81 MG EC tablet Take 1 tablet (81 mg total) by mouth daily.  Marland Kitchen spironolactone (ALDACTONE) 25 MG tablet TAKE 1 TABLET BY MOUTH EVERY DAY (Patient taking differently: Take 25 mg by mouth daily. )  . triamcinolone cream (KENALOG) 0.1 % Apply 1 application topically 2 (two) times daily. (Patient taking differently: Apply 1 application topically 2 (two) times daily as needed (rash, itching). )  . vitamin A 10000 UNIT capsule Take 10,000 Units by mouth daily.  . vitamin B-12 (CYANOCOBALAMIN) 1000 MCG tablet Take 1,000 mcg by mouth daily.  . [DISCONTINUED] lisinopril (ZESTRIL) 10 MG tablet Take 10 mg by mouth daily.     Allergies: Allergies as of 06/03/2020 - Review Complete 06/03/2020  Allergen Reaction Noted  . Penicillins  01/26/2007  . Avelox [moxifloxacin hcl in nacl] Itching and Rash 10/13/2011   Past Medical History:  Diagnosis Date  . Abnormal Pap smear   . Aortic stenosis   . Arthritis   . Asthma   . AVM (arteriovenous malformation)   . Bradycardia   . Complication of anesthesia    "they have a hard time bringing me back" (11/08/2015)  . Coronary artery disease    a. diffuse CAD of RCA by cath 2016 managed medically.  . Dynamic left ventricular outflow obstruction   . GERD (gastroesophageal reflux disease)    previously on aciphex, discontinued november 2012  because patient  asymptomatic, and concern about interference with HIV meds.  May try pepcid in the future if symptoms return  . Heart murmur   . Hepatitis C antibody test positive   . HIV infection ([place])    diagnosed before 2008  . Hypertension   . Hypertensive heart disease   . Iron deficiency anemia    Ferritin = 2 in november 2012, started on iron supplemenation  . Pacemaker    a. St Jude PPM 01/2018.  . Seizures ([place])   . Varicosities     Family History:  Family History  Problem Relation Age of Onset  . Diabetes Mother   . Ovarian cancer Sister   . Colon cancer Sister     Social History:  Social History  Tobacco Use  . Smoking status: Current Some Day Smoker    Packs/day: 0.50    Years: 50.00    Pack years: 25.00    Types: Cigarettes  . Smokeless tobacco: Never Used  . Tobacco comment: Wants to quit. Using both the patch and gum.  Substance Use Topics  . Alcohol use: Yes    Alcohol/week: 10.0 standard drinks    Types: 10 Glasses of wine per week    Comment: Wine.  . Drug use: No   Patient lives with husband and is currently raising her grandchildren, including a 19 year old and identical twins. She endorses smoking 0.5-1ppd for 60 years. She reports occasional alcohol use with drinking a glass of wine "every now and then". Denies any illicit drug use.   Review of Systems: A complete ROS was negative except as per HPI.   Physical Exam: Blood pressure (!) 172/86, pulse 60, temperature 98.7 F (37.1 C), resp. rate 20, height 5\' 2"  (1.575 m), weight 56.2 kg, SpO2 100 %.  Physical Exam Constitutional:      General: She is not in acute distress.    Appearance: She is well-developed. She is not ill-appearing or diaphoretic.  HENT:     Head: Normocephalic and atraumatic.  Eyes:     Extraocular Movements: Extraocular movements intact.     Pupils: Pupils are equal, round, and reactive to light.  Cardiovascular:     Rate and Rhythm: Normal rate and regular rhythm.     Pulses:  Normal pulses.     Heart sounds: Murmur heard.  No friction rub. No gallop.   Pulmonary:     Effort: Pulmonary effort is normal. Tachypnea present. No accessory muscle usage.     Breath sounds: Wheezing present.     Comments: Diffuse expiratory wheezing Chest:     Chest wall: No deformity, tenderness or crepitus.  Abdominal:     General: Bowel sounds are normal.     Palpations: Abdomen is soft.     Tenderness: There is no abdominal tenderness. There is no guarding or rebound.  Musculoskeletal:        General: Normal range of motion.     Right lower leg: No tenderness. No edema.     Left lower leg: No tenderness. No edema.  Skin:    General: Skin is warm and dry.     Capillary Refill: Capillary refill takes less than 2 seconds.  Neurological:     General: No focal deficit present.     Mental Status: She is alert and oriented to person, place, and time.     Cranial Nerves: No cranial nerve deficit.     Motor: No weakness.    EKG: personally reviewed my interpretation is atrial paced rhythm; unchanged from prior EKG  CXR: personally reviewed my interpretation is hyperinflated lungs, mild pulmonary congestion, no focal opacities noted.   Assessment & Plan by Problem: Active Problems:   Symptomatic anemia Nicole Mccormick is a 75 year old female with PMHx of hypertension, sick sinus syndrome s/p pacemaker placement, HIV on Biktarvy, CAD, GERD, iron deficiency anemia and small bowel AVMs admitted for symptomatic anemia.   Symptomatic anemia Patient has history of GERD on pepcid, iron deficiency anemia on PO supplements and history of small bowel AVMs on prior capsule endoscopy in 2015. She is presenting with few days of worsening dyspnea, fatigue and lightheadedness. Noted to have Hb of 5 (13.1 in 2019) on admission for which given 2u pRBC. Denies any hematemesis,  hematochezia or dark stools; however, given history of small bowel AVMs, possible may have ongoing slow bleed. GI consulted  in ED for further evaluation.  - GI consulted, appreciate recommendations  - IV protonix 40mg  bid - Possible EGD and colonoscopy in AM - CBC daily  - Iron studies - Cardiac monitoring  Acute respiratory failure ?COPD exacerbation Patient with several days of dyspnea and new oxygen requirement. She also endorses productive cough with thick sputum. No history of diagnosed COPD or asthma but does have albuterol inhaler and CXR with hyperinflated lungs. She has been using rescue inhaler more frequently. On examination, has diffuse expiratory wheezing noted.  - Azithromycin 500mg  po x4 days - IV solumedrol x1 followed by prednisone 40mg  daily - Duonebs prn - Incruse Ellipta 1 puff daily  Hypertension  CAD:  Patient with history of hypertension and CAD on multiple antihypertensives. Noted to be hypertensive to SBP >200. Patient notes that she has not taken antihypertensives this morning. Will hold off on IV beta blocker at this time given possible COPD exacerbation.  - IV hydralazine 5mg  x1 - Amlodipine 10mg  daily - IMDUR 60mg  daily - Lisinopril 40mg  daily - Spironolactone 25mg  daily  Sick sinus syndrome s/p pacemaker placement Patient has pacemaker in place. EKG with atrial-paced rhythm.  - Continue to monitor   HIV:  - Biktarvy 50-200-25mg  daily  Dispo: Admit patient to Inpatient with expected length of stay greater than 2 midnights.  Signed: Harvie Heck, MD  Internal Medicine, PGY-1 06/03/2020, 2:38 PM  Pager: [phone]

## 2020-06-03 NOTE — ED Notes (Signed)
Critical Hemoglobin 5.0. EDP made aware.

## 2020-06-03 NOTE — Progress Notes (Addendum)
Pt O2 saturation continues at 100% 5 LPM via N/Cann[place]. decerased to 3 LPM  And reported to oncoming RN . Erin from [facility] had called and indicated that she has spoken with the Pathologist and determined that there was no blood transfusion reaction. Called Dr Marva Panda for update including latest Bp at 191/73. New order received for CBC  And trial her off the Bipap. Bipap turned off and placed her on 5 L of O2 per N/C. Pt O2 saturation currently at 98% on 5 LPM n/c. Wilburn Cornelia of [facility] and [employer] charge updated. Will continue to monitor pt.

## 2020-06-03 NOTE — Plan of Care (Signed)

## 2020-06-03 NOTE — Progress Notes (Signed)
RT NOTE: RT transported patient on bipap from 57M to room 3H68 with no complications. Patient is resting comfortably on bipap and vitals are stable. RT on floor has been given report. RT will continue to monitor.

## 2020-06-03 NOTE — Consult Note (Addendum)
Referring Provider:  EDP Primary Care Physician:  Sonia Side., FNP Primary Gastroenterologist:  Dr. Penelope Coop   Reason for Consultation:  Symptomatic anemia.  HPI: Nicole Mccormick is a 74 y.o. female with past medical history of coronary artery disease, history of sick sinus syndrome status post pacemaker placement, history of HIV and hepatitis C presented to the hospital with shortness of breath.  She was found to have severe anemia with hemoglobin of 5.  GI is consulted for further evaluation.  Patient's hemoglobin was normal in July 2019.  Normal LFTs.  Normal INR on admission.  Patient seen and examined at bedside in the emergency room.  She continues to feel short of breath.  She is denying any abdominal pain, nausea or vomiting.  Denies seeing any blood in the stool or black stool.  Denies any diarrhea or constipation.  Denies any reflux, dysphagia and odynophagia.  Previous GI work-up Patient with history of chronic anemia.  Was last seen by [facility] in September 2018 and blood work at that time showed normal hemoglobin of around 14.  EGD in about 2015 showed small AVMs in the duodenum but no significant bleeding. Colonoscopy in October 2015 showed condylomata not otherwise no polyps or bleeding seen. Capsule endoscopy in October 2015 showed multiple AVMs in the proximal small bowel.  Past Medical History:  Diagnosis Date  . Abnormal Pap smear   . Aortic stenosis   . Arthritis   . Asthma   . AVM (arteriovenous malformation)   . Bradycardia   . Complication of anesthesia    "they have a hard time bringing me back" (11/08/2015)  . Coronary artery disease    a. diffuse CAD of RCA by cath 2016 managed medically.  . Dynamic left ventricular outflow obstruction   . GERD (gastroesophageal reflux disease)    previously on aciphex, discontinued november 2012  because patient asymptomatic, and concern about interference with HIV meds.  May try pepcid in the future if symptoms return  .  Heart murmur   . Hepatitis C antibody test positive   . HIV infection ([place])    diagnosed before 2008  . Hypertension   . Hypertensive heart disease   . Iron deficiency anemia    Ferritin = 2 in november 2012, started on iron supplemenation  . Pacemaker    a. St Jude PPM 01/2018.  . Seizures ([place])   . Varicosities     Past Surgical History:  Procedure Laterality Date  . ABDOMINAL HYSTERECTOMY    . CARDIAC CATHETERIZATION N/A 11/08/2015   Procedure: Left Heart Cath and Coronary Angiography;  Surgeon: Jettie Booze, MD;  Location: [facility];  Service: Cardiovascular;  Laterality: N/A;  . COLONOSCOPY  10/13/11   small adenoma, anal condyloma  . COLONOSCOPY  10/13/2011   Procedure: COLONOSCOPY;  Surgeon: Gatha Mayer, MD;  Location: [place];  Service: Endoscopy;  Laterality: N/A;  . COLONOSCOPY N/A 09/14/2014   Procedure: COLONOSCOPY;  Surgeon: Arta Silence, MD;  Location: [facility] ENDOSCOPY;  Service: Endoscopy;  Laterality: N/A;  . ESOPHAGOGASTRODUODENOSCOPY  10/13/11   small hiatal hernia  . ESOPHAGOGASTRODUODENOSCOPY  10/13/2011   Procedure: ESOPHAGOGASTRODUODENOSCOPY (EGD);  Surgeon: Gatha Mayer, MD;  Location: [facility] ENDOSCOPY;  Service: Endoscopy;  Laterality: N/A;  . ESOPHAGOGASTRODUODENOSCOPY N/A 09/14/2014   Procedure: ESOPHAGOGASTRODUODENOSCOPY (EGD);  Surgeon: Arta Silence, MD;  Location: [facility] ENDOSCOPY;  Service: Endoscopy;  Laterality: N/A;  . GIVENS CAPSULE STUDY N/A 09/14/2014   Procedure: GIVENS CAPSULE STUDY;  Surgeon:  Arta Silence, MD;  Location[facility];  Service: Endoscopy;  Laterality: N/A;  . PACEMAKER IMPLANT N/A 02/05/2018   Procedure: PACEMAKER IMPLANT;  Surgeon: Thompson Grayer, MD;  Location: [facility];  Service: Cardiovascular;  Laterality: N/A;    Prior to Admission medications   Medication Sig Start Date End Date Taking? Authorizing Provider  acyclovir (ZOVIRAX) 400 MG tablet Take 1 tablet (400 mg total) by mouth daily as needed  (for flare ups). 07/06/18  Yes Campbell Riches, MD  albuterol (PROAIR HFA) 108 (90 Base) MCG/ACT inhaler INHALE 2 PUFFS BY MOUTH EVERY 4 HOURS IF NEEDED FOR WHEEZING OR SHORTNESS OF BREATH Patient taking differently: Inhale 2 puffs into the lungs every 4 (four) hours as needed. INHALE 2 PUFFS BY MOUTH EVERY 4 HOURS IF NEEDED FOR WHEEZING OR SHORTNESS OF BREATH 01/06/19  Yes Campbell Riches, MD  amLODipine (NORVASC) 10 MG tablet TAKE 1 TABLET BY MOUTH EVERY DAY Patient taking differently: Take 10 mg by mouth daily.  10/05/18  Yes Campbell Riches, MD  Ascorbic Acid (VITAMIN C) 1000 MG tablet Take 1,000 mg by mouth daily.   Yes [provider]  atorvastatin (LIPITOR) 40 MG tablet take 1 tablet by mouth once daily AT 6 PM Patient taking differently: Take 40 mg by mouth daily. take 1 tablet by mouth once daily AT 6 PM 12/16/17  Yes Dixon, Melton Krebs, NP  BIKTARVY 50-200-25 MG TABS tablet TAKE 1 TABLET BY MOUTH EVERY DAY Patient taking differently: Take 1 tablet by mouth daily.  04/05/19  Yes Campbell Riches, MD  carvedilol (COREG) 3.125 MG tablet TAKE 1 TABLET(3.125 MG) BY MOUTH TWICE DAILY Patient taking differently: Take 3.125 mg by mouth 2 (two) times daily with a meal.  03/23/20  Yes Allred, Jeneen Rinks, MD  Cetirizine HCl (ZYRTEC ALLERGY) 10 MG TBDP Take 1 tablet by mouth daily. 05/20/17  Yes Campbell Riches, MD  cholecalciferol (VITAMIN D) 1000 units tablet Take 1,000 Units by mouth daily.   Yes [provider]  dextromethorphan (DELSYM) 30 MG/5ML liquid Take 30 mg by mouth as needed for cough.   Yes [provider]  diazepam (VALIUM) 2 MG tablet take 1 tablet by mouth every 6 hours if needed for anxiety 07/21/15  Yes Shela Leff, MD  famotidine (PEPCID) 20 MG tablet Take 1 tablet (20 mg total) by mouth 2 (two) times daily. 11/06/16  Yes Campbell Riches, MD  ferrous sulfate 325 (65 FE) MG EC tablet Take 325 mg by mouth daily.   Yes [provider]   Flaxseed, Linseed, (FLAX SEED OIL) 1000 MG CAPS Take 1,000 mg by mouth daily.   Yes [provider]  gabapentin (NEURONTIN) 600 MG tablet TAKE 1 TABLET BY MOUTH THREE TIMES DAILY Patient taking differently: Take 600 mg by mouth 3 (three) times daily.  03/03/19  Yes Michel Bickers, MD  hydrochlorothiazide (HYDRODIURIL) 12.5 MG tablet Take 12.5 mg by mouth daily. 03/30/20  Yes [provider]  HYDROcodone-acetaminophen (NORCO) 5-325 MG tablet Take 1 tablet by mouth every 6 (six) hours as needed for moderate pain. 02/06/18  Yes Seiler, Amber K, NP  isosorbide mononitrate (IMDUR) 60 MG 24 hr tablet Take 1 tablet (60 mg total) by mouth daily. 07/06/18  Yes Campbell Riches, MD  lisinopril (ZESTRIL) 40 MG tablet Take 40 mg by mouth daily. 05/11/20  Yes [provider]  Omega-3 Fatty Acids (FISH OIL) 1000 MG CPDR Take 1,000 mg by mouth daily.   Yes  [provider]  ondansetron (ZOFRAN-ODT) 4 MG disintegrating tablet Take 1 tablet (4 mg total) by mouth daily as needed for nausea or vomiting. 05/20/17  Yes Campbell Riches, MD  pantoprazole (PROTONIX) 40 MG tablet Take 1 tablet (40 mg total) by mouth daily. 12/16/17  Yes Dixon, Melton Krebs, NP  RA ASPIRIN EC ADULT LOW ST 81 MG EC tablet Take 1 tablet (81 mg total) by mouth daily. 04/12/16  Yes Shela Leff, MD  spironolactone (ALDACTONE) 25 MG tablet TAKE 1 TABLET BY MOUTH EVERY DAY Patient taking differently: Take 25 mg by mouth daily.  10/05/18  Yes Campbell Riches, MD  triamcinolone cream (KENALOG) 0.1 % Apply 1 application topically 2 (two) times daily. Patient taking differently: Apply 1 application topically 2 (two) times daily as needed (rash, itching).  08/13/17  Yes Yu, Amy V, PA-C  vitamin A 10000 UNIT capsule Take 10,000 Units by mouth daily.   Yes [provider]  vitamin B-12 (CYANOCOBALAMIN) 1000 MCG tablet Take 1,000 mcg by mouth daily.   Yes [provider]  albuterol (PROVENTIL) (2.5  MG/3ML) 0.083% nebulizer solution Take 3 mLs (2.5 mg total) by nebulization every 6 (six) hours as needed for wheezing or shortness of breath. 07/06/18   Campbell Riches, MD  cyclobenzaprine (FLEXERIL) 5 MG tablet TAKE 1 TABLET BY MOUTH TWICE DAILY AS NEEDED FOR MUSCLE SPASM Patient not taking: Reported on 06/03/2020 08/25/18   Campbell Riches, MD  escitalopram (LEXAPRO) 10 MG tablet Take 1 tablet by mouth daily as needed for anxiety. 12/23/18   [provider]    Scheduled Meds: . [START ON 06/04/2020] amLODipine  10 mg Oral Daily  . [START ON 06/04/2020] azithromycin  500 mg Oral Daily  . [START ON 06/04/2020] bictegravir-emtricitabine-tenofovir AF  1 tablet Oral Daily  . [START ON 06/04/2020] hydrochlorothiazide  12.5 mg Oral Daily  . ipratropium-albuterol  3 mL Nebulization Q6H  . [START ON 06/04/2020] isosorbide mononitrate  60 mg Oral Daily  . [START ON 06/04/2020] lisinopril  40 mg Oral Daily  . methylPREDNISolone (SOLU-MEDROL) injection  40 mg Intravenous Q6H   Followed by  . [START ON 06/04/2020] predniSONE  40 mg Oral Q breakfast  . nicotine  14 mg Transdermal Daily  . pantoprazole (PROTONIX) IV  40 mg Intravenous Once  . sodium chloride flush  3 mL Intravenous Once  . [START ON 06/04/2020] spironolactone  25 mg Oral Daily  . umeclidinium bromide  1 puff Inhalation Daily   Continuous Infusions: . azithromycin    . lactated ringers     PRN Meds:.  Allergies as of 06/03/2020 - Review Complete 06/03/2020  Allergen Reaction Noted  . Penicillins  01/26/2007  . Avelox [moxifloxacin hcl in nacl] Itching and Rash 10/13/2011    Family History  Problem Relation Age of Onset  . Diabetes Mother   . Ovarian cancer Sister   . Colon cancer Sister     Social History   Socioeconomic History  . Marital status: Married    Spouse name: Not on file  . Number of children: Not on file  . Years of education: Not on file  . Highest education level: Not on file  Occupational  History  . Not on file  Tobacco Use  . Smoking status: Current Some Day Smoker    Packs/day: 0.50    Years: 50.00    Pack years: 25.00    Types: Cigarettes  . Smokeless tobacco: Never Used  . Tobacco comment: Wants  to quit. Using both the patch and gum.  Substance and Sexual Activity  . Alcohol use: Yes    Alcohol/week: 10.0 standard drinks    Types: 10 Glasses of wine per week    Comment: Wine.  . Drug use: No  . Sexual activity: Not on file    Comment: pt. given condoms  Other Topics Concern  . Not on file  Social History Narrative  . Not on file   Social Determinants of Health   Financial Resource Strain:   . Difficulty of Paying Living Expenses:   Food Insecurity:   . Worried About Charity fundraiser in the Last Year:   . Arboriculturist in the Last Year:   Transportation Needs:   . Film/video editor (Medical):   Marland Kitchen Lack of Transportation (Non-Medical):   Physical Activity:   . Days of Exercise per Week:   . Minutes of Exercise per Session:   Stress:   . Feeling of Stress :   Social Connections:   . Frequency of Communication with Friends and Family:   . Frequency of Social Gatherings with Friends and Family:   . Attends Religious Services:   . Active Member of Clubs or Organizations:   . Attends Archivist Meetings:   Marland Kitchen Marital Status:   Intimate Partner Violence:   . Fear of Current or Ex-Partner:   . Emotionally Abused:   Marland Kitchen Physically Abused:   . Sexually Abused:     Review of Systems:Review of Systems  Constitutional: Positive for malaise/fatigue. Negative for chills and fever.  HENT: Negative for hearing loss and tinnitus.   Eyes: Negative for blurred vision and double vision.  Respiratory: Positive for shortness of breath. Negative for cough and hemoptysis.   Cardiovascular: Negative for chest pain and palpitations.  Gastrointestinal: Negative for abdominal pain, blood in stool, constipation, diarrhea, heartburn, melena, nausea and  vomiting.  Genitourinary: Negative for dysuria and urgency.  Musculoskeletal: Negative for myalgias and neck pain.  Skin: Negative for itching and rash.  Neurological: Positive for weakness. Negative for seizures.  Endo/Heme/Allergies: Does not bruise/bleed easily.  Psychiatric/Behavioral: Negative for substance abuse. The patient is nervous/anxious.    Physical Exam: Vital signs: Vitals:   06/03/20 1230 06/03/20 1308  BP: (!) 214/73 (!) 172/86  Pulse: 67 60  Resp: (!) 22 20  Temp:  98.7 F (37.1 C)  SpO2: 99% 100%     Physical Exam Vitals reviewed.  Constitutional:      General: She is in acute distress.     Appearance: She is well-developed.  HENT:     Head: Normocephalic and atraumatic.     Mouth/Throat:     Mouth: Mucous membranes are moist.     Pharynx: Oropharynx is clear. No oropharyngeal exudate.  Eyes:     Extraocular Movements: Extraocular movements intact.  Cardiovascular:     Rate and Rhythm: Normal rate and regular rhythm.     Heart sounds: Murmur heard.   Pulmonary:     Effort: Tachypnea present.     Comments: Mild respiratory distress noted Abdominal:     General: Bowel sounds are normal.     Palpations: Abdomen is soft.     Tenderness: There is no abdominal tenderness. There is no guarding or rebound.  Musculoskeletal:     Right lower leg: No edema.     Left lower leg: No edema.  Skin:    General: Skin is warm.     Findings: No rash.  Neurological:     Mental Status: She is alert and oriented to person, place, and time.  Psychiatric:        Mood and Affect: Mood is anxious.        Behavior: Behavior normal.     GI:  Lab Results: Recent Labs    06/03/20 0900  WBC 4.0  HGB 5.0*  HCT 19.3*  PLT 208   BMET Recent Labs    06/03/20 0900  NA 138  K 3.7  CL 104  CO2 25  GLUCOSE 104*  BUN 12  CREATININE 0.80  CALCIUM 8.9   LFT Recent Labs    06/03/20 1054  PROT 6.3*  ALBUMIN 3.3*  AST 19  ALT 15  ALKPHOS 55  BILITOT 0.4   BILIDIR <0.1  IBILI NOT CALCULATED   PT/INR Recent Labs    06/03/20 0900  LABPROT 13.7  INR 1.1     Studies/Results: DG Chest 2 View  Result Date: 06/03/2020 CLINICAL DATA:  Short of breath at rest for 2-3 days. EXAM: CHEST - 2 VIEW COMPARISON:  02/06/2018.  Chest CT, 12/29/2018. FINDINGS: Mild enlargement of the cardiopericardial silhouette. No mediastinal or hilar masses or evidence of adenopathy. Stable left anterior chest wall sequential pacemaker. Lungs are hyperexpanded, but clear. No pleural effusion or pneumothorax. Mild compression deformities of 2 adjacent mid to upper thoracic vertebra, stable from the prior chest CT. No acute skeletal abnormality. IMPRESSION: 1. No acute cardiopulmonary disease. Electronically Signed   By: Lajean Manes M.D.   On: 06/03/2020 09:46    Impression/Plan: -Symptomatic anemia.  No GI symptoms. -History of iron deficiency anemia with EGD, colonoscopy and  capsule endoscopy in 2015 showing few AVMs in the proximal small bowel. -History of coronary artery disease -History of sick sinus syndrome status post pacemaker placement  Recommendations ---------------------- -Transfuse to keep hemoglobin around 8. -Patient currently feeling fatigued and weak.  Has shortness of breath.  Would not be ideal to start colonoscopy prep today. -Okay to start full liquid diet today. -Consider starting colonoscopy prep tomorrow for EGD and colonoscopy for Monday. -Monitor H&H. -GI will follow    LOS: 0 days   Otis Brace  MD, FACP 06/03/2020, 1:13 PM  Contact #  [phone]

## 2020-06-03 NOTE — ED Notes (Signed)
Pt gave verbal consent to receive blood transfusion during the initial interaction with the EDP & this RN. Written consent was obtained during the dual sign off of the first bag of blood being prepared for infusion by Elmyra Ricks, RN. Pt's granddaughter was given permission by the pt to sign the consent form on her behalf. Consent is at bedside & will remain there during the blood transfusions ordered at this time.

## 2020-06-03 NOTE — ED Provider Notes (Signed)
[facility] EMERGENCY DEPARTMENT Provider Note   CSN: [id number] Arrival date & time: 06/03/20  0844     History Chief Complaint  Patient presents with   Shortness of Breath    Nicole Mccormick is a 74 y.o. female with history of coronary artery disease, sick sinus syndrome s/p pacemaker placement, IDA, HIV+, Hep C who presents with shortness of breath.  Patient states that is been going on for a past couple of days and gradually worsening. It's worse with exertion and lying down. It's better with rest and sitting up. She reports associated chest cramping with the SOB and lightheadedness without syncope. She also has a cough but has been smoking for 60 years. She denies fever, chills, abdominal pain, N/V/D, urinary symptoms. She is not on a blood thinner. She does not note any blood in the stool. She has required a blood transfusion. She underwent EGD, capsule endoscopy, and colonoscopy by [facility] in 2015 which showed small bowel AVMs. She is taking Protonix and Iron supplements daily. She denies blood thinner use. She takes 81mg  ASA daily. She reports compliance with her ART.  HPI     Past Medical History:  Diagnosis Date   Abnormal Pap smear    Aortic stenosis    Arthritis    Asthma    AVM (arteriovenous malformation)    Bradycardia    Complication of anesthesia    "they have a hard time bringing me back" (11/08/2015)   Coronary artery disease    a. diffuse CAD of RCA by cath 2016 managed medically.   Dynamic left ventricular outflow obstruction    GERD (gastroesophageal reflux disease)    previously on aciphex, discontinued november 2012  because patient asymptomatic, and concern about interference with HIV meds.  May try pepcid in the future if symptoms return   Heart murmur    Hepatitis C antibody test positive    HIV infection ([place])    diagnosed before 2008   Hypertension    Hypertensive heart disease    Iron deficiency anemia    Ferritin  = 2 in november 2012, started on iron supplemenation   Pacemaker    a. St Jude PPM 01/2018.   Seizures ([place])    Varicosities     Patient Active Problem List   Diagnosis Date Noted   Chronic arthralgias of knees and hips 07/06/2018   Sick sinus syndrome ([place]) 02/28/2016   Hypertensive cardiovascular disease 02/28/2016   Bilateral carotid bruits 02/12/2016   LVH (left ventricular hypertrophy) 01/15/2016   GI bleed 11/07/2015   Anxiety 07/22/2015   Seasonal allergies 07/22/2015   Asthma, chronic 07/22/2015   Health care maintenance 02/17/2015   Hepatitis C 08/07/2014   Back pain 10/28/2012   LGSIL Pap smear of vagina 01/16/2012   Anal condylomata 11/18/2011   CAD (coronary artery disease) 08/07/2011   Essential hypertension, benign 12/25/2010   Human immunodeficiency virus (HIV) disease ([place]) 01/23/2007   TOBACCO ABUSE 01/23/2007   CATARACT NEC 01/23/2007   Gastroesophageal reflux disease 01/23/2007    Past Surgical History:  Procedure Laterality Date   ABDOMINAL HYSTERECTOMY     CARDIAC CATHETERIZATION N/A 11/08/2015   Procedure: Left Heart Cath and Coronary Angiography;  Surgeon: Jettie Booze, MD;  Location: [place] [facility];  Service: Cardiovascular;  Laterality: N/A;   COLONOSCOPY  10/13/11   small adenoma, anal condyloma   COLONOSCOPY  10/13/2011   Procedure: COLONOSCOPY;  Surgeon: Gatha Mayer, MD;  Location: [place];  Service: Endoscopy;  Laterality: N/A;   COLONOSCOPY N/A 09/14/2014   Procedure: COLONOSCOPY;  Surgeon: Arta Silence, MD;  Location: [facility] ENDOSCOPY;  Service: Endoscopy;  Laterality: N/A;   ESOPHAGOGASTRODUODENOSCOPY  10/13/11   small hiatal hernia   ESOPHAGOGASTRODUODENOSCOPY  10/13/2011   Procedure: ESOPHAGOGASTRODUODENOSCOPY (EGD);  Surgeon: Gatha Mayer, MD;  Location: [facility] ENDOSCOPY;  Service: Endoscopy;  Laterality: N/A;   ESOPHAGOGASTRODUODENOSCOPY N/A 09/14/2014   Procedure: ESOPHAGOGASTRODUODENOSCOPY  (EGD);  Surgeon: Arta Silence, MD;  Location: [facility] ENDOSCOPY;  Service: Endoscopy;  Laterality: N/A;   GIVENS CAPSULE STUDY N/A 09/14/2014   Procedure: GIVENS CAPSULE STUDY;  Surgeon: Arta Silence, MD;  Location: [facility] ENDOSCOPY;  Service: Endoscopy;  Laterality: N/A;   PACEMAKER IMPLANT N/A 02/05/2018   Procedure: PACEMAKER IMPLANT;  Surgeon: Thompson Grayer, MD;  Location: [place] [facility] LAB;  Service: Cardiovascular;  Laterality: N/A;     OB History    Gravida  6   Para  5   Term      Preterm      AB  1   Living  5     SAB  1   TAB      Ectopic      Multiple      Live Births              Family History  Problem Relation Age of Onset   Diabetes Mother    Ovarian cancer Sister    Colon cancer Sister     Social History   Tobacco Use   Smoking status: Current Some Day Smoker    Packs/day: 0.50    Years: 50.00    Pack years: 25.00    Types: Cigarettes   Smokeless tobacco: Never Used   Tobacco comment: Wants to quit. Using both the patch and gum.  Substance Use Topics   Alcohol use: Yes    Alcohol/week: 10.0 standard drinks    Types: 10 Glasses of wine per week    Comment: Wine.   Drug use: No    Home Medications Prior to Admission medications   Medication Sig Start Date End Date Taking? Authorizing Provider  acyclovir (ZOVIRAX) 400 MG tablet Take 1 tablet (400 mg total) by mouth daily as needed (for flare ups). 07/06/18   Campbell Riches, MD  albuterol (PROAIR HFA) 108 (90 Base) MCG/ACT inhaler INHALE 2 PUFFS BY MOUTH EVERY 4 HOURS IF NEEDED FOR WHEEZING OR SHORTNESS OF BREATH 01/06/19   Campbell Riches, MD  albuterol (PROVENTIL) (2.5 MG/3ML) 0.083% nebulizer solution Take 3 mLs (2.5 mg total) by nebulization every 6 (six) hours as needed for wheezing or shortness of breath. 07/06/18   Campbell Riches, MD  amLODipine (NORVASC) 10 MG tablet TAKE 1 TABLET BY MOUTH EVERY DAY 10/05/18   Campbell Riches, MD  Ascorbic Acid (VITAMIN C) 1000 MG  tablet Take 1,000 mg by mouth daily.    [provider]  atorvastatin (LIPITOR) 40 MG tablet take 1 tablet by mouth once daily AT 6 PM 12/16/17   Merigold Callas, NP  BIKTARVY 50-200-25 MG TABS tablet TAKE 1 TABLET BY MOUTH EVERY DAY 04/05/19   Campbell Riches, MD  carvedilol (COREG) 3.125 MG tablet TAKE 1 TABLET(3.125 MG) BY MOUTH TWICE DAILY 03/23/20   Allred, Jeneen Rinks, MD  Cetirizine HCl (ZYRTEC ALLERGY) 10 MG TBDP Take 1 tablet by mouth daily. 05/20/17   Campbell Riches, MD  cholecalciferol (VITAMIN D) 1000 units tablet Take 1,000 Units by mouth daily.  [provider]  cyclobenzaprine (FLEXERIL) 5 MG tablet TAKE 1 TABLET BY MOUTH TWICE DAILY AS NEEDED FOR MUSCLE SPASM 08/25/18   Campbell Riches, MD  diazepam (VALIUM) 2 MG tablet take 1 tablet by mouth every 6 hours if needed for anxiety 07/21/15   Shela Leff, MD  escitalopram (LEXAPRO) 10 MG tablet Take 1 tablet by mouth daily as needed for anxiety. 12/23/18   [provider]  famotidine (PEPCID) 20 MG tablet Take 1 tablet (20 mg total) by mouth 2 (two) times daily. 11/06/16   Campbell Riches, MD  ferrous sulfate 325 (65 FE) MG EC tablet Take 325 mg by mouth daily.    [provider]  Flaxseed, Linseed, (FLAX SEED OIL) 1000 MG CAPS Take 1,000 mg by mouth daily.    [provider]  gabapentin (NEURONTIN) 600 MG tablet TAKE 1 TABLET BY MOUTH THREE TIMES DAILY 03/03/19   Michel Bickers, MD  HYDROcodone-acetaminophen [facility]) 5-325 MG tablet Take 1 tablet by mouth every 6 (six) hours as needed for moderate pain. 02/06/18   Patsey Berthold, NP  isosorbide mononitrate (IMDUR) 60 MG 24 hr tablet Take 1 tablet (60 mg total) by mouth daily. 07/06/18   Campbell Riches, MD  lisinopril (ZESTRIL) 10 MG tablet Take 10 mg by mouth daily. 01/20/19   [provider]  Omega-3 Fatty Acids (FISH OIL) 1000 MG CPDR Take 1,000 mg by mouth daily.    [provider]  ondansetron (ZOFRAN-ODT) 4 MG  disintegrating tablet Take 1 tablet (4 mg total) by mouth daily as needed for nausea or vomiting. 05/20/17   Campbell Riches, MD  pantoprazole (PROTONIX) 40 MG tablet Take 1 tablet (40 mg total) by mouth daily. 12/16/17   Pingree Callas, NP  RA ASPIRIN EC ADULT LOW ST 81 MG EC tablet Take 1 tablet (81 mg total) by mouth daily. 04/12/16   Shela Leff, MD  spironolactone (ALDACTONE) 25 MG tablet TAKE 1 TABLET BY MOUTH EVERY DAY 10/05/18   Campbell Riches, MD  triamcinolone cream (KENALOG) 0.1 % Apply 1 application topically 2 (two) times daily. 08/13/17   Tasia Catchings, Amy V, PA-C  vitamin A 10000 UNIT capsule Take 10,000 Units by mouth daily.    [provider]  vitamin B-12 (CYANOCOBALAMIN) 1000 MCG tablet Take 1,000 mcg by mouth daily.    [provider]    Allergies    Penicillins and Avelox [moxifloxacin hcl in nacl]  Review of Systems   Review of Systems  Constitutional: Negative for chills and fever.  Respiratory: Positive for shortness of breath.   Cardiovascular: Positive for chest pain.  Gastrointestinal: Positive for nausea. Negative for abdominal pain, blood in stool, diarrhea and vomiting.  Genitourinary: Negative for dysuria.  Neurological: Positive for light-headedness. Negative for syncope.  All other systems reviewed and are negative.   Physical Exam Updated Vital Signs BP (!) 207/71    Pulse (!) 59    Temp 98.6 F (37 C) (Oral)    Resp 19    Ht 5\' 2"  (1.575 m)    Wt 56.2 kg    SpO2 97%    BMI 22.68 kg/m   Physical Exam Vitals and nursing note reviewed.  Constitutional:      General: She is in acute distress (mild due to SOB).     Appearance: She is well-developed. She is not ill-appearing.     Comments: Cooperative and tachypenic  HENT:     Head: Normocephalic and atraumatic.  Eyes:     General: No scleral icterus.       Right eye: No discharge.        Left eye: No discharge.     Conjunctiva/sclera: Conjunctivae normal.     Pupils: Pupils  are equal, round, and reactive to light.     Comments: Pale conjunctiva  Cardiovascular:     Rate and Rhythm: Normal rate and regular rhythm.  Pulmonary:     Effort: Pulmonary effort is normal. No respiratory distress.     Breath sounds: Normal breath sounds.  Abdominal:     General: There is no distension.     Palpations: Abdomen is soft.     Tenderness: There is no abdominal tenderness.  Genitourinary:    Comments: Rectal: Non-thrombosed external hemorrhoid. Brown stool noted Musculoskeletal:     Cervical back: Normal range of motion.  Skin:    General: Skin is warm and dry.     Coloration: Skin is pale.  Neurological:     Mental Status: She is alert and oriented to person, place, and time.  Psychiatric:        Behavior: Behavior normal.     ED Results / Procedures / Treatments   Labs (all labs ordered are listed, but only abnormal results are displayed) Labs Reviewed  BASIC METABOLIC PANEL - Abnormal; Notable for the following components:      Result Value   Glucose, Bld 104 (*)    All other components within normal limits  CBC - Abnormal; Notable for the following components:   RBC 2.21 (*)    Hemoglobin 5.0 (*)    HCT 19.3 (*)    MCH 22.6 (*)    MCHC 25.9 (*)    RDW 17.7 (*)    All other components within normal limits  HEPATIC FUNCTION PANEL  TYPE AND SCREEN  TROPONIN I (HIGH SENSITIVITY)    EKG EKG Interpretation  Date/Time:  Saturday June 03 2020 08:44:03 EDT Ventricular Rate:  60 PR Interval:  208 QRS Duration: 90 QT Interval:  408 QTC Calculation: 408 R Axis:   0 Text Interpretation: Atrial-paced rhythm Cannot rule out Anterior infarct , age undetermined No significant change since last tracing Confirmed by Blanchie Dessert [phone]) on 06/03/2020 9:29:19 AM   Radiology DG Chest 2 View  Result Date: 06/03/2020 CLINICAL DATA:  Short of breath at rest for 2-3 days. EXAM: CHEST - 2 VIEW COMPARISON:  02/06/2018.  Chest CT, 12/29/2018. FINDINGS: Mild  enlargement of the cardiopericardial silhouette. No mediastinal or hilar masses or evidence of adenopathy. Stable left anterior chest wall sequential pacemaker. Lungs are hyperexpanded, but clear. No pleural effusion or pneumothorax. Mild compression deformities of 2 adjacent mid to upper thoracic vertebra, stable from the prior chest CT. No acute skeletal abnormality. IMPRESSION: 1. No acute cardiopulmonary disease. Electronically Signed   By: Lajean Manes M.D.   On: 06/03/2020 09:46    Procedures Procedures (including critical care time)  CRITICAL CARE Performed by: Recardo Evangelist   Total critical care time: 40 minutes  Critical care time was exclusive of separately billable procedures and treating other patients.  Critical care was necessary to treat or prevent imminent or life-threatening deterioration.  Critical care was time spent personally by me on the following activities: development of treatment plan with patient and/or surrogate as well as nursing, discussions with consultants, evaluation of patient's response to treatment, examination of patient, obtaining history from patient or surrogate, ordering and performing treatments and interventions, ordering and review  of laboratory studies, ordering and review of radiographic studies, pulse oximetry and re-evaluation of patient's condition.   Medications Ordered in ED Medications  sodium chloride flush (NS) 0.9 % injection 3 mL (0 mLs Intravenous Hold 06/03/20 0936)  0.9 %  sodium chloride infusion (10 mL/hr Intravenous New Bag/Given (Non-Interop) 06/03/20 1125)  albuterol (VENTOLIN HFA) 108 (90 Base) MCG/ACT inhaler 2 puff (2 puffs Inhalation Given 06/03/20 1140)    ED Course  I have reviewed the triage vital signs and the nursing notes.  Pertinent labs & imaging results that were available during my care of the patient were reviewed by me and considered in my medical decision making (see chart for details).  74 year old  female presents with gradually worsening shortness of breath over the past couple of days with associated lightheadedness.  She is hypertensive but otherwise vital signs are reassuring.  She is significantly short of breath although not hypoxic.  Heart is regular rate and rhythm.  She is tachypneic on exam.  Abdomen is soft and nontender.  Rectal exam is remarkable for nonthrombosed hemorrhoids and brown stool.  Hemoccult is positive here.  CBC is remarkable for hemoglobin of 5.  Initial troponin is normal.  Chest x-ray is negative.  Shared visit with Dr. Maryan Rued.  Will give albuterol treatment and 2 units of packed red blood cells.  Will discuss with GI.  Discussed with Dr. Luan Pulling with [facility].  He will come to see the patient.  Will consult medicine for admission.  Covid swab ordered.  Discussed with internal medicine who will admit   MDM Rules/Calculators/A&P                           Final Clinical Impression(s) / ED Diagnoses Final diagnoses:  Symptomatic anemia  Shortness of breath    Rx / DC Orders ED Discharge Orders    None       Recardo Evangelist, PA-C 06/03/20 1312    Blanchie Dessert, MD 06/03/20 2049

## 2020-06-03 NOTE — Progress Notes (Signed)
Paged by nursing staff regarding worsening respiratory distress with dyspnea. Examined and evaluated Nicole Mccormick at bedside. She mentions acute worsening of her dyspnea while receiving her transfusion. Noted to have difficulty getting words out. Repeatedly states 'I can't breathe' Per bedside nursing staff, received breathing treatment recently in ED prior to moving up to floors  Gen: Well-developed, well nourished, in distress HEENT: NCAT head, hearing intact CV: RRR, S1, S2 normal, No rubs, no murmurs, no gallops Pulm: Distant breath sounds, no wheeze, no rales Extm: ROM intact, Peripheral pulses intact, No peripheral edema Skin: Dry, Warm Neuro: AAOx3  A/P  Acute respiratory distress 2/2 TRALI vs TACO vs COPD On admission noted to have expiratory wheezes, started on solumedrol and duonebs for COPD exacerbation. Worsening dyspnea despite receiving duonebs, solumedrol. Does not appear volume loaded on exam. No obvious wheezing currently. Possibly TRALI considering <6 hrs since transfusion - ABGs - Troponin - Chest X-ray - Put on Bipap - Transfer to progressive

## 2020-06-03 NOTE — H&P (View-Only) (Signed)
Referring Provider:  EDP Primary Care Physician:  Sonia Side., FNP Primary Gastroenterologist:  Dr. Penelope Coop   Reason for Consultation:  Symptomatic anemia.  HPI: Nicole Mccormick is a 74 y.o. female with past medical history of coronary artery disease, history of sick sinus syndrome status post pacemaker placement, history of HIV and hepatitis C presented to the hospital with shortness of breath.  She was found to have severe anemia with hemoglobin of 5.  GI is consulted for further evaluation.  Patient's hemoglobin was normal in July 2019.  Normal LFTs.  Normal INR on admission.  Patient seen and examined at bedside in the emergency room.  She continues to feel short of breath.  She is denying any abdominal pain, nausea or vomiting.  Denies seeing any blood in the stool or black stool.  Denies any diarrhea or constipation.  Denies any reflux, dysphagia and odynophagia.  Previous GI work-up Patient with history of chronic anemia.  Was last seen by [facility] in September 2018 and blood work at that time showed normal hemoglobin of around 14.  EGD in about 2015 showed small AVMs in the duodenum but no significant bleeding. Colonoscopy in October 2015 showed condylomata not otherwise no polyps or bleeding seen. Capsule endoscopy in October 2015 showed multiple AVMs in the proximal small bowel.  Past Medical History:  Diagnosis Date  . Abnormal Pap smear   . Aortic stenosis   . Arthritis   . Asthma   . AVM (arteriovenous malformation)   . Bradycardia   . Complication of anesthesia    "they have a hard time bringing me back" (11/08/2015)  . Coronary artery disease    a. diffuse CAD of RCA by cath 2016 managed medically.  . Dynamic left ventricular outflow obstruction   . GERD (gastroesophageal reflux disease)    previously on aciphex, discontinued november 2012  because patient asymptomatic, and concern about interference with HIV meds.  May try pepcid in the future if symptoms return  .  Heart murmur   . Hepatitis C antibody test positive   . HIV infection ([place])    diagnosed before 2008  . Hypertension   . Hypertensive heart disease   . Iron deficiency anemia    Ferritin = 2 in november 2012, started on iron supplemenation  . Pacemaker    a. St Jude PPM 01/2018.  . Seizures ([place])   . Varicosities     Past Surgical History:  Procedure Laterality Date  . ABDOMINAL HYSTERECTOMY    . CARDIAC CATHETERIZATION N/A 11/08/2015   Procedure: Left Heart Cath and Coronary Angiography;  Surgeon: Jettie Booze, MD;  Location: [place] [facility];  Service: Cardiovascular;  Laterality: N/A;  . COLONOSCOPY  10/13/11   small adenoma, anal condyloma  . COLONOSCOPY  10/13/2011   Procedure: COLONOSCOPY;  Surgeon: Gatha Mayer, MD;  Location: [place];  Service: Endoscopy;  Laterality: N/A;  . COLONOSCOPY N/A 09/14/2014   Procedure: COLONOSCOPY;  Surgeon: Arta Silence, MD;  Location: [facility] ENDOSCOPY;  Service: Endoscopy;  Laterality: N/A;  . ESOPHAGOGASTRODUODENOSCOPY  10/13/11   small hiatal hernia  . ESOPHAGOGASTRODUODENOSCOPY  10/13/2011   Procedure: ESOPHAGOGASTRODUODENOSCOPY (EGD);  Surgeon: Gatha Mayer, MD;  Location: [facility] ENDOSCOPY;  Service: Endoscopy;  Laterality: N/A;  . ESOPHAGOGASTRODUODENOSCOPY N/A 09/14/2014   Procedure: ESOPHAGOGASTRODUODENOSCOPY (EGD);  Surgeon: Arta Silence, MD;  Location: [facility] ENDOSCOPY;  Service: Endoscopy;  Laterality: N/A;  . GIVENS CAPSULE STUDY N/A 09/14/2014   Procedure: GIVENS CAPSULE STUDY;  Surgeon:  Arta Silence, MD;  Location: [facility] ENDOSCOPY;  Service: Endoscopy;  Laterality: N/A;  . PACEMAKER IMPLANT N/A 02/05/2018   Procedure: PACEMAKER IMPLANT;  Surgeon: Thompson Grayer, MD;  Location: [place] [facility];  Service: Cardiovascular;  Laterality: N/A;    Prior to Admission medications   Medication Sig Start Date End Date Taking? Authorizing Provider  acyclovir (ZOVIRAX) 400 MG tablet Take 1 tablet (400 mg total) by mouth daily as needed  (for flare ups). 07/06/18  Yes Campbell Riches, MD  albuterol (PROAIR HFA) 108 (90 Base) MCG/ACT inhaler INHALE 2 PUFFS BY MOUTH EVERY 4 HOURS IF NEEDED FOR WHEEZING OR SHORTNESS OF BREATH Patient taking differently: Inhale 2 puffs into the lungs every 4 (four) hours as needed. INHALE 2 PUFFS BY MOUTH EVERY 4 HOURS IF NEEDED FOR WHEEZING OR SHORTNESS OF BREATH 01/06/19  Yes Campbell Riches, MD  amLODipine (NORVASC) 10 MG tablet TAKE 1 TABLET BY MOUTH EVERY DAY Patient taking differently: Take 10 mg by mouth daily.  10/05/18  Yes Campbell Riches, MD  Ascorbic Acid (VITAMIN C) 1000 MG tablet Take 1,000 mg by mouth daily.   Yes [provider]  atorvastatin (LIPITOR) 40 MG tablet take 1 tablet by mouth once daily AT 6 PM Patient taking differently: Take 40 mg by mouth daily. take 1 tablet by mouth once daily AT 6 PM 12/16/17  Yes Dixon, Melton Krebs, NP  BIKTARVY 50-200-25 MG TABS tablet TAKE 1 TABLET BY MOUTH EVERY DAY Patient taking differently: Take 1 tablet by mouth daily.  04/05/19  Yes Campbell Riches, MD  carvedilol (COREG) 3.125 MG tablet TAKE 1 TABLET(3.125 MG) BY MOUTH TWICE DAILY Patient taking differently: Take 3.125 mg by mouth 2 (two) times daily with a meal.  03/23/20  Yes Allred, Jeneen Rinks, MD  Cetirizine HCl (ZYRTEC ALLERGY) 10 MG TBDP Take 1 tablet by mouth daily. 05/20/17  Yes Campbell Riches, MD  cholecalciferol (VITAMIN D) 1000 units tablet Take 1,000 Units by mouth daily.   Yes [provider]  dextromethorphan (DELSYM) 30 MG/5ML liquid Take 30 mg by mouth as needed for cough.   Yes [provider]  diazepam (VALIUM) 2 MG tablet take 1 tablet by mouth every 6 hours if needed for anxiety 07/21/15  Yes Shela Leff, MD  famotidine (PEPCID) 20 MG tablet Take 1 tablet (20 mg total) by mouth 2 (two) times daily. 11/06/16  Yes Campbell Riches, MD  ferrous sulfate 325 (65 FE) MG EC tablet Take 325 mg by mouth daily.   Yes [provider]   Flaxseed, Linseed, (FLAX SEED OIL) 1000 MG CAPS Take 1,000 mg by mouth daily.   Yes [provider]  gabapentin (NEURONTIN) 600 MG tablet TAKE 1 TABLET BY MOUTH THREE TIMES DAILY Patient taking differently: Take 600 mg by mouth 3 (three) times daily.  03/03/19  Yes Michel Bickers, MD  hydrochlorothiazide (HYDRODIURIL) 12.5 MG tablet Take 12.5 mg by mouth daily. 03/30/20  Yes [provider]  HYDROcodone-acetaminophen (NORCO) 5-325 MG tablet Take 1 tablet by mouth every 6 (six) hours as needed for moderate pain. 02/06/18  Yes Seiler, Amber K, NP  isosorbide mononitrate (IMDUR) 60 MG 24 hr tablet Take 1 tablet (60 mg total) by mouth daily. 07/06/18  Yes Campbell Riches, MD  lisinopril (ZESTRIL) 40 MG tablet Take 40 mg by mouth daily. 05/11/20  Yes [provider]  Omega-3 Fatty Acids (FISH OIL) 1000 MG CPDR Take 1,000 mg by mouth daily.   Yes  [provider]  ondansetron (ZOFRAN-ODT) 4 MG disintegrating tablet Take 1 tablet (4 mg total) by mouth daily as needed for nausea or vomiting. 05/20/17  Yes Campbell Riches, MD  pantoprazole (PROTONIX) 40 MG tablet Take 1 tablet (40 mg total) by mouth daily. 12/16/17  Yes Dixon, Melton Krebs, NP  RA ASPIRIN EC ADULT LOW ST 81 MG EC tablet Take 1 tablet (81 mg total) by mouth daily. 04/12/16  Yes Shela Leff, MD  spironolactone (ALDACTONE) 25 MG tablet TAKE 1 TABLET BY MOUTH EVERY DAY Patient taking differently: Take 25 mg by mouth daily.  10/05/18  Yes Campbell Riches, MD  triamcinolone cream (KENALOG) 0.1 % Apply 1 application topically 2 (two) times daily. Patient taking differently: Apply 1 application topically 2 (two) times daily as needed (rash, itching).  08/13/17  Yes Yu, Amy V, PA-C  vitamin A 10000 UNIT capsule Take 10,000 Units by mouth daily.   Yes [provider]  vitamin B-12 (CYANOCOBALAMIN) 1000 MCG tablet Take 1,000 mcg by mouth daily.   Yes [provider]  albuterol (PROVENTIL) (2.5  MG/3ML) 0.083% nebulizer solution Take 3 mLs (2.5 mg total) by nebulization every 6 (six) hours as needed for wheezing or shortness of breath. 07/06/18   Campbell Riches, MD  cyclobenzaprine (FLEXERIL) 5 MG tablet TAKE 1 TABLET BY MOUTH TWICE DAILY AS NEEDED FOR MUSCLE SPASM Patient not taking: Reported on 06/03/2020 08/25/18   Campbell Riches, MD  escitalopram (LEXAPRO) 10 MG tablet Take 1 tablet by mouth daily as needed for anxiety. 12/23/18   [provider]    Scheduled Meds: . [START ON 06/04/2020] amLODipine  10 mg Oral Daily  . [START ON 06/04/2020] azithromycin  500 mg Oral Daily  . [START ON 06/04/2020] bictegravir-emtricitabine-tenofovir AF  1 tablet Oral Daily  . [START ON 06/04/2020] hydrochlorothiazide  12.5 mg Oral Daily  . ipratropium-albuterol  3 mL Nebulization Q6H  . [START ON 06/04/2020] isosorbide mononitrate  60 mg Oral Daily  . [START ON 06/04/2020] lisinopril  40 mg Oral Daily  . methylPREDNISolone (SOLU-MEDROL) injection  40 mg Intravenous Q6H   Followed by  . [START ON 06/04/2020] predniSONE  40 mg Oral Q breakfast  . nicotine  14 mg Transdermal Daily  . pantoprazole (PROTONIX) IV  40 mg Intravenous Once  . sodium chloride flush  3 mL Intravenous Once  . [START ON 06/04/2020] spironolactone  25 mg Oral Daily  . umeclidinium bromide  1 puff Inhalation Daily   Continuous Infusions: . azithromycin    . lactated ringers     PRN Meds:.  Allergies as of 06/03/2020 - Review Complete 06/03/2020  Allergen Reaction Noted  . Penicillins  01/26/2007  . Avelox [moxifloxacin hcl in nacl] Itching and Rash 10/13/2011    Family History  Problem Relation Age of Onset  . Diabetes Mother   . Ovarian cancer Sister   . Colon cancer Sister     Social History   Socioeconomic History  . Marital status: Married    Spouse name: Not on file  . Number of children: Not on file  . Years of education: Not on file  . Highest education level: Not on file  Occupational  History  . Not on file  Tobacco Use  . Smoking status: Current Some Day Smoker    Packs/day: 0.50    Years: 50.00    Pack years: 25.00    Types: Cigarettes  . Smokeless tobacco: Never Used  . Tobacco comment: Wants  to quit. Using both the patch and gum.  Substance and Sexual Activity  . Alcohol use: Yes    Alcohol/week: 10.0 standard drinks    Types: 10 Glasses of wine per week    Comment: Wine.  . Drug use: No  . Sexual activity: Not on file    Comment: pt. given condoms  Other Topics Concern  . Not on file  Social History Narrative  . Not on file   Social Determinants of Health   Financial Resource Strain:   . Difficulty of Paying Living Expenses:   Food Insecurity:   . Worried About Charity fundraiser in the Last Year:   . Arboriculturist in the Last Year:   Transportation Needs:   . Film/video editor (Medical):   Marland Kitchen Lack of Transportation (Non-Medical):   Physical Activity:   . Days of Exercise per Week:   . Minutes of Exercise per Session:   Stress:   . Feeling of Stress :   Social Connections:   . Frequency of Communication with Friends and Family:   . Frequency of Social Gatherings with Friends and Family:   . Attends Religious Services:   . Active Member of Clubs or Organizations:   . Attends Archivist Meetings:   Marland Kitchen Marital Status:   Intimate Partner Violence:   . Fear of Current or Ex-Partner:   . Emotionally Abused:   Marland Kitchen Physically Abused:   . Sexually Abused:     Review of Systems:Review of Systems  Constitutional: Positive for malaise/fatigue. Negative for chills and fever.  HENT: Negative for hearing loss and tinnitus.   Eyes: Negative for blurred vision and double vision.  Respiratory: Positive for shortness of breath. Negative for cough and hemoptysis.   Cardiovascular: Negative for chest pain and palpitations.  Gastrointestinal: Negative for abdominal pain, blood in stool, constipation, diarrhea, heartburn, melena, nausea and  vomiting.  Genitourinary: Negative for dysuria and urgency.  Musculoskeletal: Negative for myalgias and neck pain.  Skin: Negative for itching and rash.  Neurological: Positive for weakness. Negative for seizures.  Endo/Heme/Allergies: Does not bruise/bleed easily.  Psychiatric/Behavioral: Negative for substance abuse. The patient is nervous/anxious.    Physical Exam: Vital signs: Vitals:   06/03/20 1230 06/03/20 1308  BP: (!) 214/73 (!) 172/86  Pulse: 67 60  Resp: (!) 22 20  Temp:  98.7 F (37.1 C)  SpO2: 99% 100%     Physical Exam Vitals reviewed.  Constitutional:      General: She is in acute distress.     Appearance: She is well-developed.  HENT:     Head: Normocephalic and atraumatic.     Mouth/Throat:     Mouth: Mucous membranes are moist.     Pharynx: Oropharynx is clear. No oropharyngeal exudate.  Eyes:     Extraocular Movements: Extraocular movements intact.  Cardiovascular:     Rate and Rhythm: Normal rate and regular rhythm.     Heart sounds: Murmur heard.   Pulmonary:     Effort: Tachypnea present.     Comments: Mild respiratory distress noted Abdominal:     General: Bowel sounds are normal.     Palpations: Abdomen is soft.     Tenderness: There is no abdominal tenderness. There is no guarding or rebound.  Musculoskeletal:     Right lower leg: No edema.     Left lower leg: No edema.  Skin:    General: Skin is warm.     Findings: No rash.  Neurological:     Mental Status: She is alert and oriented to person, place, and time.  Psychiatric:        Mood and Affect: Mood is anxious.        Behavior: Behavior normal.     GI:  Lab Results: Recent Labs    06/03/20 0900  WBC 4.0  HGB 5.0*  HCT 19.3*  PLT 208   BMET Recent Labs    06/03/20 0900  NA 138  K 3.7  CL 104  CO2 25  GLUCOSE 104*  BUN 12  CREATININE 0.80  CALCIUM 8.9   LFT Recent Labs    06/03/20 1054  PROT 6.3*  ALBUMIN 3.3*  AST 19  ALT 15  ALKPHOS 55  BILITOT 0.4   BILIDIR <0.1  IBILI NOT CALCULATED   PT/INR Recent Labs    06/03/20 0900  LABPROT 13.7  INR 1.1     Studies/Results: DG Chest 2 View  Result Date: 06/03/2020 CLINICAL DATA:  Short of breath at rest for 2-3 days. EXAM: CHEST - 2 VIEW COMPARISON:  02/06/2018.  Chest CT, 12/29/2018. FINDINGS: Mild enlargement of the cardiopericardial silhouette. No mediastinal or hilar masses or evidence of adenopathy. Stable left anterior chest wall sequential pacemaker. Lungs are hyperexpanded, but clear. No pleural effusion or pneumothorax. Mild compression deformities of 2 adjacent mid to upper thoracic vertebra, stable from the prior chest CT. No acute skeletal abnormality. IMPRESSION: 1. No acute cardiopulmonary disease. Electronically Signed   By: Lajean Manes M.D.   On: 06/03/2020 09:46    Impression/Plan: -Symptomatic anemia.  No GI symptoms. -History of iron deficiency anemia with EGD, colonoscopy and  capsule endoscopy in 2015 showing few AVMs in the proximal small bowel. -History of coronary artery disease -History of sick sinus syndrome status post pacemaker placement  Recommendations ---------------------- -Transfuse to keep hemoglobin around 8. -Patient currently feeling fatigued and weak.  Has shortness of breath.  Would not be ideal to start colonoscopy prep today. -Okay to start full liquid diet today. -Consider starting colonoscopy prep tomorrow for EGD and colonoscopy for Monday. -Monitor H&H. -GI will follow    LOS: 0 days   Otis Brace  MD, FACP 06/03/2020, 1:13 PM  Contact #  [phone]

## 2020-06-04 DIAGNOSIS — R0600 Dyspnea, unspecified: Secondary | ICD-10-CM

## 2020-06-04 DIAGNOSIS — D72819 Decreased white blood cell count, unspecified: Secondary | ICD-10-CM

## 2020-06-04 DIAGNOSIS — I10 Essential (primary) hypertension: Secondary | ICD-10-CM

## 2020-06-04 DIAGNOSIS — K922 Gastrointestinal hemorrhage, unspecified: Secondary | ICD-10-CM

## 2020-06-04 DIAGNOSIS — R0602 Shortness of breath: Secondary | ICD-10-CM

## 2020-06-04 DIAGNOSIS — J9600 Acute respiratory failure, unspecified whether with hypoxia or hypercapnia: Secondary | ICD-10-CM

## 2020-06-04 DIAGNOSIS — D649 Anemia, unspecified: Secondary | ICD-10-CM

## 2020-06-04 DIAGNOSIS — D509 Iron deficiency anemia, unspecified: Secondary | ICD-10-CM

## 2020-06-04 DIAGNOSIS — Z21 Asymptomatic human immunodeficiency virus [HIV] infection status: Secondary | ICD-10-CM

## 2020-06-04 DIAGNOSIS — J441 Chronic obstructive pulmonary disease with (acute) exacerbation: Secondary | ICD-10-CM

## 2020-06-04 DIAGNOSIS — I2510 Atherosclerotic heart disease of native coronary artery without angina pectoris: Secondary | ICD-10-CM

## 2020-06-04 LAB — BASIC METABOLIC PANEL
Anion gap: 9 (ref 5–15)
BUN: 10 mg/dL (ref 8–23)
CO2: 25 mmol/L (ref 22–32)
Calcium: 8.9 mg/dL (ref 8.9–10.3)
Chloride: 101 mmol/L (ref 98–111)
Creatinine, Ser: 0.81 mg/dL (ref 0.44–1.00)
GFR calc Af Amer: >60 mL/min (ref >60)
GFR calc non Af Amer: >60 mL/min (ref >60)
Glucose, Bld: 141 mg/dL — ABNORMAL HIGH (ref 70–99)
Potassium: 3.8 mmol/L (ref 3.5–5.1)
Sodium: 135 mmol/L (ref 135–145)

## 2020-06-04 LAB — TYPE AND SCREEN
ABO/RH(D): O POS
Antibody Screen: NEGATIVE
Unit division: 0
Unit division: 0

## 2020-06-04 LAB — BPAM RBC
Blood Product Expiration Date: 202107272359
Blood Product Expiration Date: 202107272359
ISSUE DATE / TIME: 202106261100
ISSUE DATE / TIME: 202106261100
Unit Type and Rh: 5100
Unit Type and Rh: 5100

## 2020-06-04 LAB — GLUCOSE, CAPILLARY: Glucose-Capillary: 213 mg/dL — ABNORMAL HIGH (ref 70–99)

## 2020-06-04 LAB — CBC
HCT: 28 % — ABNORMAL LOW (ref 36.0–46.0)
Hemoglobin: 8.5 g/dL — ABNORMAL LOW (ref 12.0–15.0)
MCH: 25.9 pg — ABNORMAL LOW (ref 26.0–34.0)
MCHC: 30.4 g/dL (ref 30.0–36.0)
MCV: 85.4 fL (ref 80.0–100.0)
Platelets: 198 10*3/uL (ref 150–400)
RBC: 3.28 MIL/uL — ABNORMAL LOW (ref 3.87–5.11)
RDW: 16.1 % — ABNORMAL HIGH (ref 11.5–15.5)
WBC: 2.7 10*3/uL — ABNORMAL LOW (ref 4.0–10.5)
nRBC: 0 % (ref 0.0–0.2)

## 2020-06-04 MED ORDER — INSULIN ASPART 100 UNIT/ML ~~LOC~~ SOLN
0.0000 [IU] | Freq: Three times a day (TID) | SUBCUTANEOUS | Status: DC
  Administered 2020-06-04: 3 [IU] via SUBCUTANEOUS

## 2020-06-04 MED ORDER — SODIUM CHLORIDE 0.9 % IV SOLN
510.0000 mg | Freq: Once | INTRAVENOUS | Status: AC
  Administered 2020-06-04: 510 mg via INTRAVENOUS
  Filled 2020-06-04: qty 17

## 2020-06-04 MED ORDER — SODIUM CHLORIDE 0.9 % IV SOLN: INTRAVENOUS | Status: DC

## 2020-06-04 MED ORDER — PREDNISONE 20 MG PO TABS
40.0000 mg | ORAL_TABLET | Freq: Every day | ORAL | Status: DC
  Administered 2020-06-05: 40 mg via ORAL
  Filled 2020-06-04: qty 2

## 2020-06-04 MED ORDER — BENZONATATE 100 MG PO CAPS
100.0000 mg | ORAL_CAPSULE | Freq: Three times a day (TID) | ORAL | Status: DC | PRN
  Administered 2020-06-04: 100 mg via ORAL
  Filled 2020-06-04: qty 1

## 2020-06-04 MED ORDER — PEG 3350-KCL-NA BICARB-NACL 420 G PO SOLR
4000.0000 mL | Freq: Once | ORAL | Status: AC
  Administered 2020-06-04: 4000 mL via ORAL
  Filled 2020-06-04: qty 4000

## 2020-06-04 MED ORDER — ACETAMINOPHEN 325 MG PO TABS
650.0000 mg | ORAL_TABLET | Freq: Four times a day (QID) | ORAL | Status: DC | PRN
  Administered 2020-06-04 – 2020-06-05 (×3): 650 mg via ORAL
  Filled 2020-06-04 (×3): qty 2

## 2020-06-04 MED ORDER — BOOST / RESOURCE BREEZE PO LIQD CUSTOM
1.0000 | Freq: Three times a day (TID) | ORAL | Status: DC
  Administered 2020-06-04 (×2): 1 via ORAL

## 2020-06-04 MED ORDER — PANTOPRAZOLE SODIUM 40 MG IV SOLR
40.0000 mg | Freq: Two times a day (BID) | INTRAVENOUS | Status: DC
  Administered 2020-06-04 – 2020-06-05 (×3): 40 mg via INTRAVENOUS
  Filled 2020-06-04 (×3): qty 40

## 2020-06-04 NOTE — Progress Notes (Signed)
[facility]astro[facility] Progress Note  Taytem Ghattas 74 y.o. May 30, 1946  CC: Symptomatic anemia   Subjective: Patient seen and examined at bedside.  Doing much better now.  Denies any blood in the stool or black stool.  Denies abdominal pain, nausea and vomiting.  ROS : Shortness of breath improved.  Denies acute chest pain   Objective: Vital signs in last 24 hours: Vitals:   06/04/20 1141 06/04/20 1200  BP: 133/63 131/61  Pulse: 71 65  Resp: 17   Temp:  98.2 F (36.8 C)  SpO2: 96% 98%    Physical Exam:  General:  Alert, cooperative, no distress, appears stated age  Head:  Normocephalic, without obvious abnormality, atraumatic  Eyes:  , EOM's intact,   Lungs:    No respiratory distress  Heart:  Regular rate and rhythm, S1, S2 normal  Abdomen:   Soft, non-tender, nondistended, bowel sounds present.  No peritoneal signs,   Extremities: Extremities normal, atraumatic, no  edema       Lab Results: Recent Labs    06/03/20 0900 06/04/20 0230  NA 138 135  K 3.7 3.8  CL 104 101  CO2 25 25  GLUCOSE 104* 141*  BUN 12 10  CREATININE 0.80 0.81  CALCIUM 8.9 8.9   Recent Labs    06/03/20 1054  AST 19  ALT 15  ALKPHOS 55  BILITOT 0.4  PROT 6.3*  ALBUMIN 3.3*   Recent Labs    06/03/20 1856 06/04/20 0230  WBC 5.1 2.7*  NEUTROABS 4.6  --   HGB 9.2* 8.5*  HCT 30.1* 28.0*  MCV 86.5 85.4  PLT 218 198   Recent Labs    06/03/20 0900  LABPROT 13.7  INR 1.1      Assessment/Plan: -Symptomatic anemia with occult blood positive stool.  Hemoglobin improved status post blood transfusion. -History of iron deficiency anemia with EGD, colonoscopy and  capsule endoscopy in 2015 showing few AVMs in the proximal small bowel. -History of coronary artery disease -History of sick sinus syndrome status post pacemaker placement  Recommendations ---------------------- -Plan for EGD and colonoscopy tomorrow. -Okay to have clear liquid diet today.  N.p.o. past  midnight -Discussed with family at bedside.  Risks (bleeding, infection, bowel perforation that could require surgery, sedation-related changes in cardiopulmonary systems), benefits (identification and possible treatment of source of symptoms, exclusion of certain causes of symptoms), and alternatives (watchful waiting, radiographic imaging studies, empiric medical treatment)  were explained to patient/family in detail and patient wishes to proceed.   Otis Brace MD, [place] 06/04/2020, 12:21 PM  Contact #  [phone]

## 2020-06-04 NOTE — Progress Notes (Signed)
Pt on RA at this time tolerating well. Pt does not wish to wear BIpap tonight unless she is in distress. I old her to call if she feels like she needs Bipap. RT will continue to monitor.

## 2020-06-04 NOTE — Progress Notes (Signed)
  Date: 06/04/2020  Patient name: Nicole Mccormick  Medical record number: [id number]  Date of birth: Sep 26, 1946   I have seen and evaluated Nicole Mccormick and discussed their care with the Residency Team. Briefly, Nicole Mccormick came in with DOE which progressed to DOE, wheezing and symptomatic anemia.  She had a blood transfusion yesterday evening. After transfusion she had an acute respiratory distress requiring bipap.  She had DAT tested on the blood and it was negative.  This was possibly a panic attack vs. Another reaction to treatment.  Today  She is feeling much better, continues to be on oxygen for comfort, she is 100% on pulse ox.  She feels much better.  She is aware of GI plan for EGD and colonoscopy.   Vitals:   06/04/20 0748 06/04/20 0807  BP: (!) 197/79   Pulse: 65   Resp:    Temp: (!) 97.4 F (36.3 C)   SpO2: 100% 100%   General: Sitting up in bed, NAD HENT: Neck is supple, MMM CV: RR, NR, blood pressure is high, she has not had her morning medications.  She has no LE edema.  She has a systolic murmur which is radiating to the carotids.  Best heard at LUSB.  Pulm: Breathing more comfortably, no nasal cannula, occasional expiratory wheezing, dry cough with deep breathing Abd: +BS, NT, ND MSK: Normal muscle tone and bulk, no contractures Psych: Pleasant, normal mood.   Assessment and Plan: I have seen and evaluated the patient as outlined above. I agree with the formulated Assessment and Plan as detailed in the residents' note, with the following changes:   1. Acute GI bleeding, acute blood loss anemia, symptomatic anemia - Likely source is known AVMS with slow bleeding, however, she will need to be evaluated for lower or upper GI source - GI following, anticipate endoscopy tomorrow, NPO at midnight - IV protonix - Trend CBC - Continue oxygen, wean if possible - Iron is low, consider IV infusion  2. Acute respiratory failure (she does not carry a diagnosis of COPD, will need OP PFTs if  possible) - Continue oxygen, wean to room air - Treatment of presumed COPD exacerbation with steroids, azithromycin and duonebs - Tobacco cessation counseling should be attempted.   3. Leukopenia - All lines down, monitor - DAT for blood transfusion reaction negative - Consider allergy to new medication if continues to worsen.   Other issues per Dr. Margy Mccormick note.   Sid Falcon, MD 6/27/20218:50 AM

## 2020-06-04 NOTE — Progress Notes (Signed)
Initial Nutrition Assessment  DOCUMENTATION CODES:   Not applicable  INTERVENTION:   Boost Breeze po TID, each supplement provides 250 kcal and 9 grams of protein  NUTRITION DIAGNOSIS:   Inadequate oral intake related to altered GI function (GI bleed) as evidenced by  (limited to clear liquids today, NPO after midnight).  GOAL:   Patient will meet greater than or equal to 90% of their needs  MONITOR:   Diet advancement, PO intake, Supplement acceptance  REASON FOR ASSESSMENT:   Consult COPD Protocol  ASSESSMENT:   74 yo female admitted with symptomatic anemia, acute GI bleed. PMH includes HTN, asthma, CAD, HIV, seizures, GERD, pacemaker, small bowel AVMs.   Patient is currently on clear liquids. She will be NPO after midnight for EGD and colonoscopy tomorrow.    Patient reports recent intake has been good and weight has been stable. She is hungry and would like to have something to eat, but only allowed clear liquids today in preparation for EGD and colonoscopy tomorrow. She agreed to try Boost Breeze supplement to maximize protein and calorie intake while on clear liquids.  Labs reviewed.  Medications reviewed and include novolog, protonix, prednisone, aldactone, feraheme.  NUTRITION - FOCUSED PHYSICAL EXAM:  unable to complete  Diet Order:   Diet Order            Diet NPO time specified  Diet effective midnight           Diet clear liquid Room service appropriate? Yes; Fluid consistency: Thin  Diet effective now                 EDUCATION NEEDS:   Not appropriate for education at this time  Skin:  Skin Assessment: Reviewed RN Assessment  Last BM:  6/26  Height:   Ht Readings from Last 1 Encounters:  06/03/20 5\' 2"  (1.575 m)    Weight:   Wt Readings from Last 1 Encounters:  06/03/20 56.2 kg    Ideal Body Weight:  50 kg  BMI:  Body mass index is 22.68 kg/m.  Estimated Nutritional Needs:   Kcal:  1500-1700  Protein:  75-90 gm  Fluid:   >/= 1.5 L    Lucas Mallow, RD, LDN, CNSC Please refer to Amion for contact information.

## 2020-06-04 NOTE — Evaluation (Signed)
Physical Therapy Evaluation Patient Details Name: Nicole Mccormick MRN: [id number] DOB: [birth year] Today's Date: 06/04/2020   History of Present Illness  Nicole Mccormick is a 74 y.o. female with past medical history of coronary artery disease, history of sick sinus syndrome status post pacemaker placement, history of HIV and hepatitis C presented to the hospital with shortness of breath.  She was found to have severe anemia with hemoglobin of 5.  Clinical Impression  Pt admitted with above diagnosis. Pt was able to ambulate with min guard assist with RW with good stability. Did not need O2 with ambulation today and sats >95% on RA.  Pt progressing well and should do well with equipment and HHPt at home.  Did discuss A living vs I living with pt and daughter as pt and daughter want to check into a better home enviroment for pt and husband that has less steps. Pt currently with functional limitations due to the deficits listed below (see PT Problem List). Pt will benefit from skilled PT to increase their independence and safety with mobility to allow discharge to the venue listed below.    Follow Up Recommendations Home health PT;Supervision - Intermittent    Equipment Recommendations  3in1 (PT) (rollator)    Recommendations for Other Services       Precautions / Restrictions Restrictions Weight Bearing Restrictions: No      Mobility  Bed Mobility Overal bed mobility: Independent                Transfers Overall transfer level: Independent                  Ambulation/Gait Ambulation/Gait assistance: Min guard Gait Distance (Feet): 150 Feet Assistive device: Rolling walker (2 wheeled) Gait Pattern/deviations: Step-through pattern;Decreased stride length   Gait velocity interpretation: 1.31 - 2.62 ft/sec, indicative of limited community ambulator General Gait Details: Pt was able to ambulate well with RW.  Pt interented in obtaining a rollator so she can rest prn with ambulation  and this PT agrees.  Pt sats on RA were 95% threfore left O2 off as pt does not seem to desaturate on RA at rest or with actvitiy.   Stairs            Wheelchair Mobility    Modified Rankin (Stroke Patients Only)       Balance Overall balance assessment: Needs assistance Sitting-balance support: No upper extremity supported;Feet supported Sitting balance-Leahy Scale: Fair     Standing balance support: Bilateral upper extremity supported;During functional activity Standing balance-Leahy Scale: Fair Standing balance comment: can stand statically without UE support                             Pertinent Vitals/Pain Pain Assessment: No/denies pain    Home Living Family/patient expects to be discharged to:: Private residence Living Arrangements: Spouse/significant other Available Help at Discharge: Family;Available 24 hours/day Type of Home: Apartment Home Access: Level entry Entrance Stairs-Rails: None Entrance Stairs-Number of Steps: 1 Home Layout: Two level Home Equipment: Shower seat Additional Comments: husband cannot assist pt as his balance is impaired. Children work but stop by as often as able.     Prior Function Level of Independence: Needs assistance   Gait / Transfers Assistance Needed: used no device PTA  ADL's / Homemaking Assistance Needed: Pt B/D self, children assist prn        Hand Dominance   Dominant Hand: Right    Extremity/Trunk Assessment  Upper Extremity Assessment Upper Extremity Assessment: Defer to OT evaluation    Lower Extremity Assessment Lower Extremity Assessment: Generalized weakness    Cervical / Trunk Assessment Cervical / Trunk Assessment: Normal  Communication   Communication: No difficulties  Cognition Arousal/Alertness: Awake/alert Behavior During Therapy: WFL for tasks assessed/performed Overall Cognitive Status: Within Functional Limits for tasks assessed                                         General Comments      Exercises     Assessment/Plan    PT Assessment Patient needs continued PT services  PT Problem List Decreased activity tolerance;Decreased balance;Decreased mobility;Decreased knowledge of use of DME;Decreased safety awareness;Decreased knowledge of precautions       PT Treatment Interventions DME instruction;Gait training;Functional mobility training;Therapeutic activities;Therapeutic exercise;Balance training;Patient/family education;Stair training    PT Goals (Current goals can be found in the Care Plan section)  Acute Rehab PT Goals Patient Stated Goal: to go home PT Goal Formulation: With patient Time For Goal Achievement: 06/18/20 Potential to Achieve Goals: Good    Frequency Min 3X/week   Barriers to discharge        Co-evaluation               AM-PAC PT "6 Clicks" Mobility  Outcome Measure Help needed turning from your back to your side while in a flat bed without using bedrails?: None Help needed moving from lying on your back to sitting on the side of a flat bed without using bedrails?: None Help needed moving to and from a bed to a chair (including a wheelchair)?: A Little Help needed standing up from a chair using your arms (e.g., wheelchair or bedside chair)?: A Little Help needed to walk in hospital room?: A Little Help needed climbing 3-5 steps with a railing? : A Little 6 Click Score: 20    End of Session Equipment Utilized During Treatment: Gait belt Activity Tolerance: Patient limited by fatigue Patient left: with call bell/phone within reach;in bed;with family/visitor present Nurse Communication: Mobility status PT Visit Diagnosis: Muscle weakness (generalized) (M62.81)    Time: 5885-0277 PT Time Calculation (min) (ACUTE ONLY): 30 min   Charges:   PT Evaluation $PT Eval Moderate Complexity: 1 Mod          Alica Shellhammer W,PT Acute Rehabilitation Services Pager:  [phone]  Office:  [place] 06/04/2020, 4:00 PM

## 2020-06-04 NOTE — Progress Notes (Signed)
Subjective: HD 1 Overnight, no acute events reported.   Nicole Mccormick was examined and evaluated at bedside this am. She mentions having significant improvement in her breathing. States she would like to eat if possible. Discussed need to start bowel prep for possible scope. Nicole Mccormick expressed understanding  Objective:  Vital signs in last 24 hours: Vitals:   06/04/20 0441 06/04/20 0748 06/04/20 0807 06/04/20 1141  BP: 132/67 (!) 197/79  133/63  Pulse:  65  71  Resp:    17  Temp: 98.6 F (37 C) (!) 97.4 F (36.3 C)    TempSrc: Oral Oral    SpO2:  100% 100% 96%  Weight:      Height:       CBC Latest Ref Rng & Units 06/04/2020 06/03/2020 06/03/2020  WBC 4.0 - 10.5 K/uL 2.7(L) 5.1 4.0  Hemoglobin 12.0 - 15.0 g/dL 8.5(L) 9.2(L) 5.0(LL)  Hematocrit 36 - 46 % 28.0(L) 30.1(L) 19.3(L)  Platelets 150 - 400 K/uL 198 218 208   BMP Latest Ref Rng & Units 06/04/2020 06/03/2020 07/06/2018  Glucose 70 - 99 mg/dL 141(H) 104(H) 105(H)  BUN 8 - 23 mg/dL 10 12 24   Creatinine 0.44 - 1.00 mg/dL 0.81 0.80 0.90  BUN/Creat Ratio 6 - 22 (calc) - - NOT APPLICABLE  Sodium 419 - 145 mmol/L 135 138 139  Potassium 3.5 - 5.1 mmol/L 3.8 3.7 4.5  Chloride 98 - 111 mmol/L 101 104 106  CO2 22 - 32 mmol/L 25 25 26   Calcium 8.9 - 10.3 mg/dL 8.9 8.9 9.2   Physical Exam  Constitutional: Appears well-developed and well-nourished. No distress.  HENT:  Normocephalic and atraumatic. EOMI and MMM, conjunctivae nl Cardiovascular: RRR, S1 and S2 present, systolic murmur, no rubs or gallops noted Respiratory: No respiratory distress, resting comfortably on room air, minimal expiratory wheezing, dry cough with deep breathing  GI: Soft. Bowel sounds are normal. No distension. There is no tenderness.  Musculoskeletal: No edema. Normal muscle tone and bulk Neurological: Is alert and oriented x4, no apparent focal deficits noted  Skin: Not diaphoretic. No erythema, rash or lesions noted  Psychiatric: Normal mood and  affect.  Assessment/Plan:  Active Problems:   Symptomatic anemia   Dyspnea   Shortness of breath Nicole Mccormick is a 74 year old female with PMHx of hypertension, sick sinus syndrome s/p pacemaker placement, HIV on Biktarvy, CAD, GERD, iron deficiency anemia and small bowel AVMs admitted for symptomatic anemia.   Symptomatic anemia Acute GI bleed Iron deficiency anemia Patient with history of small bowel AVMs and iron deficiency anemia presenting with Hb of 5. S/p 2u pRBC with Hb of 8.5 this AM. Iron studies with severe iron deficiency anemia. Patient evaluated by GI and recommended for EGD and colonoscopy.  - GI consulted, appreciate their recommendations - IV protonix 40mg  bid - EGD and colonoscopy in AM - IV feraheme infusion  - Trend CBC - Cardiac monitoring   Acute respiratory failure 2/2 COPD exacerbation  Patient required BiPAP for worsening respiratory failure yesterday. However, quickly improved and able to be transitioned to nasal cannula. Patient weaned to room air during examination and maintaining oxygen saturation >95%. Notes improvement in symptoms but ongoing cough  - Continue azithromycin 500mg  day 2/5 - Prednisone 40mg  daily day 2/5 - Duonebs tid - Tessalon 100mg  tid prn  Leukopenia HIV: Patient w/Hx of HIV on Biktarvy noted to have leukopenia on labs. No signs of infection at this time. Last CD4 count in 2008 was 420. HIV viral  load undetectable in 2019. Last seen by Dr. Johnnye Sima in 2019 - CD4 count  - CBC daily  - Continue Biktarvy  Hypertension CAD:  - Amlodipine 10mg  daily - IMDUR 60mg  daily - Lisinopril 40mg  daily - Spironolactone 25mg  daily  FEN/GI Diet: Full liquid Fluids: None Electrolytes: Monitor and replete prn  DVT Prophylaxis: SCDs Code status: FULL  Prior to Admission Living Arrangement: Home Anticipated Discharge Location: Home Barriers to Discharge: Continued medical management  Dispo: Anticipated discharge in approximately 1-2  day(s).   Harvie Heck, MD  Internal Medicine, PGY-1 06/04/2020, 11:48 AM Pager: [phone] After 5pm on weekdays and 1pm on weekends: On Call pager [phone]

## 2020-06-05 ENCOUNTER — Encounter (HOSPITAL_COMMUNITY)
Admission: AD | Disposition: A | Discharge status: Home Health | Payer: Self-pay | Source: Home / Self Care | Attending: Internal Medicine

## 2020-06-05 ENCOUNTER — Encounter (HOSPITAL_COMMUNITY): Payer: Self-pay | Admitting: Internal Medicine

## 2020-06-05 ENCOUNTER — Inpatient Hospital Stay (HOSPITAL_COMMUNITY): Payer: Medicare Other | Admitting: Anesthesiology

## 2020-06-05 ENCOUNTER — Other Ambulatory Visit: Payer: Self-pay

## 2020-06-05 DIAGNOSIS — D500 Iron deficiency anemia secondary to blood loss (chronic): Secondary | ICD-10-CM

## 2020-06-05 HISTORY — PX: ESOPHAGOGASTRODUODENOSCOPY (EGD) WITH PROPOFOL: SHX5813

## 2020-06-05 HISTORY — PX: COLONOSCOPY WITH PROPOFOL: SHX5780

## 2020-06-05 HISTORY — PX: POLYPECTOMY: SHX5525

## 2020-06-05 LAB — CBC
HCT: 27.8 % — ABNORMAL LOW (ref 36.0–46.0)
Hemoglobin: 8.4 g/dL — ABNORMAL LOW (ref 12.0–15.0)
MCH: 26.3 pg (ref 26.0–34.0)
MCHC: 30.2 g/dL (ref 30.0–36.0)
MCV: 86.9 fL (ref 80.0–100.0)
Platelets: 209 10*3/uL (ref 150–400)
RBC: 3.2 MIL/uL — ABNORMAL LOW (ref 3.87–5.11)
RDW: 16.4 % — ABNORMAL HIGH (ref 11.5–15.5)
WBC: 6.9 10*3/uL (ref 4.0–10.5)
nRBC: 0.3 % — ABNORMAL HIGH (ref 0.0–0.2)

## 2020-06-05 LAB — BASIC METABOLIC PANEL
Anion gap: 8 (ref 5–15)
BUN: 9 mg/dL (ref 8–23)
CO2: 27 mmol/L (ref 22–32)
Calcium: 9.1 mg/dL (ref 8.9–10.3)
Chloride: 104 mmol/L (ref 98–111)
Creatinine, Ser: 0.74 mg/dL (ref 0.44–1.00)
GFR calc Af Amer: >60 mL/min (ref >60)
GFR calc non Af Amer: >60 mL/min (ref >60)
Glucose, Bld: 104 mg/dL — ABNORMAL HIGH (ref 70–99)
Potassium: 3.3 mmol/L — ABNORMAL LOW (ref 3.5–5.1)
Sodium: 139 mmol/L (ref 135–145)

## 2020-06-05 LAB — T-HELPER CELLS (CD4) COUNT (NOT AT ARMC)
CD4 % Helper T Cell: 35 % (ref 33–65)
CD4 T Cell Abs: 389 /uL — ABNORMAL LOW (ref 400–1790)

## 2020-06-05 LAB — HEMOGLOBIN A1C
Hgb A1c MFr Bld: 4.9 % (ref 4.8–5.6)
Mean Plasma Glucose: 93.93 mg/dL

## 2020-06-05 LAB — GLUCOSE, CAPILLARY: Glucose-Capillary: 118 mg/dL — ABNORMAL HIGH (ref 70–99)

## 2020-06-05 SURGERY — ESOPHAGOGASTRODUODENOSCOPY (EGD) WITH PROPOFOL
Anesthesia: Monitor Anesthesia Care

## 2020-06-05 MED ORDER — PROPOFOL 500 MG/50ML IV EMUL
INTRAVENOUS | Status: DC | PRN
  Administered 2020-06-05: 125 ug/kg/min via INTRAVENOUS

## 2020-06-05 MED ORDER — PREDNISONE 20 MG PO TABS: 40.0000 mg | ORAL_TABLET | Freq: Every day | ORAL | 0 refills | Status: AC

## 2020-06-05 MED ORDER — PROPOFOL 10 MG/ML IV BOLUS
INTRAVENOUS | Status: DC | PRN
  Administered 2020-06-05: 20 mg via INTRAVENOUS

## 2020-06-05 MED ORDER — LACTATED RINGERS IV SOLN
INTRAVENOUS | Status: AC | PRN
  Administered 2020-06-05: 1000 mL via INTRAVENOUS

## 2020-06-05 MED ORDER — AZITHROMYCIN 250 MG PO TABS: ORAL_TABLET | ORAL | 0 refills | Status: DC

## 2020-06-05 SURGICAL SUPPLY — 25 items

## 2020-06-05 NOTE — Anesthesia Preprocedure Evaluation (Addendum)
Anesthesia Evaluation  Patient identified by MRN, date of birth, ID band Patient awake    Reviewed: Allergy & Precautions, NPO status , Patient's Chart, lab work & pertinent test results  Airway Mallampati: I  TM Distance: >3 FB Neck ROM: Full    Dental no notable dental hx. (+) Upper Dentures   Pulmonary shortness of breath, Current Smoker,    Pulmonary exam normal breath sounds clear to auscultation       Cardiovascular hypertension, + CAD  Normal cardiovascular exam+ pacemaker + Valvular Problems/Murmurs AS  Rhythm:Regular Rate:Normal  01/28/20 Echo - Normal LV size with mild LV hypertrophy. EF 65-70%, vigorous  systolic function. There was an LV mid-cavity gradient, 42 mmHg  peak at rest and 56 mmHg peak with Valsalva. Normal RV size and  systolic function. Mild to moderate aortic stenosis (mean  gradient 20 mmHg, AVA 1.77 cm^2).    Neuro/Psych negative neurological ROS  negative psych ROS   GI/Hepatic (+) Hepatitis -, C  Endo/Other    Renal/GU negative Renal ROS     Musculoskeletal  (+) Arthritis ,   Abdominal   Peds  Hematology  (+) anemia , HIV, Lab Results      Component                Value               Date                      WBC                      6.9                 06/05/2020                HGB                      8.4 (L)             06/05/2020                HCT                      27.8 (L)            06/05/2020                MCV                      86.9                06/05/2020                PLT                      209                 06/05/2020             Anesthesia Other Findings   Reproductive/Obstetrics                            Anesthesia Physical Anesthesia Plan  ASA: III  Anesthesia Plan: MAC   Post-op Pain Management:    Induction:   PONV Risk Score and Plan: Treatment may vary due to age or medical condition  Airway Management  Planned: Nasal Cannula  and Natural Airway  Additional Equipment:   Intra-op Plan:   Post-operative Plan:   Informed Consent: I have reviewed the patients History and Physical, chart, labs and discussed the procedure including the risks, benefits and alternatives for the proposed anesthesia with the patient or authorized representative who has indicated his/her understanding and acceptance.     Dental advisory given  Plan Discussed with:   Anesthesia Plan Comments: (Pt w Anemia for EGD)       Anesthesia Quick Evaluation

## 2020-06-05 NOTE — Interval H&P Note (Signed)
History and Physical Interval Note:  06/05/2020 10:12 AM  Nicole Mccormick  has presented today for surgery, with the diagnosis of Symptomatic anemia.  The various methods of treatment have been discussed with the patient and family. After consideration of risks, benefits and other options for treatment, the patient has consented to  Procedure(s): ESOPHAGOGASTRODUODENOSCOPY (EGD) WITH PROPOFOL (N/A) COLONOSCOPY WITH PROPOFOL (N/A) as a surgical intervention.  The patient's history has been reviewed, patient examined, no change in status, stable for surgery.  I have reviewed the patient's chart and labs.  Questions were answered to the patient's satisfaction.     [name]

## 2020-06-05 NOTE — Progress Notes (Signed)
Physician contacted to inform that patient is back from endo and given the results of the procedure as reported to me by endo staff. Patient has had a regular diet ordered and is calling to order lunch. Resting at bedside with no complaints.

## 2020-06-05 NOTE — Anesthesia Postprocedure Evaluation (Signed)
Anesthesia Post Note  Patient: Nicole Mccormick  Procedure(s) Performed: ESOPHAGOGASTRODUODENOSCOPY (EGD) WITH PROPOFOL (N/A ) COLONOSCOPY WITH PROPOFOL (N/A ) POLYPECTOMY     Patient location during evaluation: Endoscopy Anesthesia Type: MAC Level of consciousness: awake and alert Pain management: pain level controlled Vital Signs Assessment: post-procedure vital signs reviewed and stable Respiratory status: spontaneous breathing, nonlabored ventilation, respiratory function stable and patient connected to nasal cannula oxygen Cardiovascular status: blood pressure returned to baseline and stable Postop Assessment: no apparent nausea or vomiting Anesthetic complications: no   No complications documented.  Last Vitals:  Vitals:   06/05/20 1120 06/05/20 1130  BP: (!) 171/75 (!) 172/89  Pulse: (!) 59 63  Resp: 19 20  Temp:    SpO2: (!) 88% 95%    Last Pain:  Vitals:   06/05/20 1109  TempSrc: Oral  PainSc: 0-No pain                 Barnet Glasgow

## 2020-06-05 NOTE — Telephone Encounter (Signed)
Nicole Mccormick with [facility] requesting to speak with a nurse about azithromycin (ZITHROMAX) tablet 500 mg. Please call back.

## 2020-06-05 NOTE — Anesthesia Procedure Notes (Signed)
Procedure Name: MAC Date/Time: 06/05/2020 10:21 AM Performed by: Inda Coke, CRNA Pre-anesthesia Checklist: Patient identified, Emergency Drugs available, Suction available, Timeout performed and Patient being monitored Patient Re-evaluated:Patient Re-evaluated prior to induction Oxygen Delivery Method: Nasal cannula Induction Type: IV induction Dental Injury: Teeth and Oropharynx as per pre-operative assessment

## 2020-06-05 NOTE — Op Note (Signed)
[facility] Patient Name: Nicole Mccormick Procedure Date : 06/05/2020 MRN: [id number] Attending MD: Arta Silence , MD Date of Birth: 07/27/1946 CSN: [id number] Age: 74 Admit Type: Inpatient Procedure:                Upper GI endoscopy Indications:              Suspected upper gastrointestinal bleeding in                            patient with unexplained iron deficiency anemia Providers:                Arta Silence, MD, Josie Dixon, RN, Elspeth Cho Tech., Technician, Rejeana Brock, CRNA Referring MD:              Medicines:                Monitored Anesthesia Care Complications:            No immediate complications. Estimated Blood Loss:     Estimated blood loss: none. Procedure:                Pre-Anesthesia Assessment:                           - Prior to the procedure, a History and Physical                            was performed, and patient medications and                            allergies were reviewed. The patient's tolerance of                            previous anesthesia was also reviewed. The risks                            and benefits of the procedure and the sedation                            options and risks were discussed with the patient.                            All questions were answered, and informed consent                            was obtained. Prior Anticoagulants: The patient has                            taken no previous anticoagulant or antiplatelet                            agents. ASA Grade Assessment: III - A patient with  severe systemic disease. After reviewing the risks                            and benefits, the patient was deemed in                            satisfactory condition to undergo the procedure.                           After obtaining informed consent, the endoscope was                            passed under direct vision. Throughout the                             procedure, the patient's blood pressure, pulse, and                            oxygen saturations were monitored continuously. The                            [id number]-[id number] ([id number] Olympus gastroscope was                            introduced through the mouth, and advanced to the                            second part of duodenum. The upper GI endoscopy was                            accomplished without difficulty. The patient                            tolerated the procedure well. Scope In: Scope Out: Findings:      A small hiatal hernia was present.      The exam of the esophagus was otherwise normal.      Mild pre-pyloric and pyloric channel edema; the entire examined stomach       was normal.      A single diminutive angiodysplastic lesion without bleeding was found in       the second portion of the duodenum.      Diminutive duodenal bulb; the exam of the duodenum was otherwise normal.      No old or fresh blood was seen to the extent of our examination. Impression:               - Small hiatal hernia.                           - Normal stomach.                           - A single non-bleeding angiodysplastic lesion in                            the duodenum. Recommendation:           -  Perform a colonoscopy today. Procedure Code(s):        --- Professional ---                           [phone], Esophagogastroduodenoscopy, flexible,                            transoral; diagnostic, including collection of                            specimen(s) by brushing or washing, when performed                            (separate procedure) Diagnosis Code(s):        --- Professional ---                           K44.9, Diaphragmatic hernia without obstruction or                            gangrene                           K31.819, Angiodysplasia of stomach and duodenum                            without bleeding                           D50.9, Iron deficiency anemia,  unspecified CPT copyright 2019 [employer]. All rights reserved. The codes documented in this report are preliminary and upon coder review may  be revised to meet current compliance requirements. Arta Silence, MD 06/05/2020 11:13:31 AM This report has been signed electronically. Number of Addenda: 0

## 2020-06-05 NOTE — Interval H&P Note (Signed)
History and Physical Interval Note:  06/05/2020 10:11 AM  Nicole Mccormick  has presented today for surgery, with the diagnosis of Symptomatic anemia.  The various methods of treatment have been discussed with the patient and family. After consideration of risks, benefits and other options for treatment, the patient has consented to  Procedure(s): ESOPHAGOGASTRODUODENOSCOPY (EGD) WITH PROPOFOL (N/A) COLONOSCOPY WITH PROPOFOL (N/A) as a surgical intervention.  The patient's history has been reviewed, patient examined, no change in status, stable for surgery.  I have reviewed the patient's chart and labs.  Questions were answered to the patient's satisfaction.     [name]

## 2020-06-05 NOTE — Transfer of Care (Signed)
Immediate Anesthesia Transfer of Care Note  Patient: Nicole Mccormick  Procedure(s) Performed: ESOPHAGOGASTRODUODENOSCOPY (EGD) WITH PROPOFOL (N/A ) COLONOSCOPY WITH PROPOFOL (N/A ) POLYPECTOMY  Patient Location: PACU and Endoscopy Unit  Anesthesia Type:MAC  Level of Consciousness: awake and alert   Airway & Oxygen Therapy: Patient Spontanous Breathing and Patient connected to nasal cannula oxygen  Post-op Assessment: Report given to RN and Post -op Vital signs reviewed and stable  Post vital signs: Reviewed and stable  Last Vitals:  Vitals Value Taken Time  BP    Temp    Pulse    Resp    SpO2      Last Pain:  Vitals:   06/05/20 0932  TempSrc: Temporal  PainSc: 0-No pain      Patients Stated Pain Goal: 0 (25/36/64 4034)  Complications: No complications documented.

## 2020-06-05 NOTE — Progress Notes (Signed)
Subjective: HD 2 Overnight, no acute events reported.   Nicole Mccormick was examined and evaluated at bedside this am. She is resting comfortably in bed. She notes feeling much better this morning. Cough has improved and no further respiratory symptoms. She denies any abdominal pain.   Objective:  Vital signs in last 24 hours: Vitals:   06/04/20 1415 06/04/20 1544 06/04/20 2053 06/05/20 0552  BP:  (!) 149/68 (!) 144/76 (!) 165/78  Pulse:  64 60 64  Resp:  18 18 18   Temp:  98.7 F (37.1 C) 98.7 F (37.1 C) 98.6 F (37 C)  TempSrc:  Oral Oral Oral  SpO2: 97% 98% 96% 96%  Weight:    56.2 kg  Height:       CBC Latest Ref Rng & Units 06/05/2020 06/04/2020 06/03/2020  WBC 4.0 - 10.5 K/uL 6.9 2.7(L) 5.1  Hemoglobin 12.0 - 15.0 g/dL 8.4(L) 8.5(L) 9.2(L)  Hematocrit 36 - 46 % 27.8(L) 28.0(L) 30.1(L)  Platelets 150 - 400 K/uL 209 198 218   BMP Latest Ref Rng & Units 06/05/2020 06/04/2020 06/03/2020  Glucose 70 - 99 mg/dL 104(H) 141(H) 104(H)  BUN 8 - 23 mg/dL 9 10 12   Creatinine 0.44 - 1.00 mg/dL 0.74 0.81 0.80  BUN/Creat Ratio 6 - 22 (calc) - - -  Sodium 135 - 145 mmol/L 139 135 138  Potassium 3.5 - 5.1 mmol/L 3.3(L) 3.8 3.7  Chloride 98 - 111 mmol/L 104 101 104  CO2 22 - 32 mmol/L 27 25 25   Calcium 8.9 - 10.3 mg/dL 9.1 8.9 8.9   Physical Exam  Constitutional: Appears well-developed and well-nourished. No distress.  HENT:  Normocephalic and atraumatic. EOMI and MMM, conjunctivae nl Cardiovascular: RRR, S1 and S2 present, systolic murmur, no rubs or gallops noted Respiratory: No respiratory distress, resting comfortably on room air, clear to auscultation bilaterally GI: Soft. Bowel sounds are normal. No distension. There is no tenderness.  Musculoskeletal: No edema. Normal muscle tone and bulk Neurological: Is alert and oriented x4, no apparent focal deficits noted  Skin: Not diaphoretic. No erythema, rash or lesions noted  Psychiatric: Normal mood and  affect.  Assessment/Plan:  Active Problems:   Symptomatic anemia   Dyspnea   Shortness of breath [name] is a 74 year old female with PMHx of hypertension, sick sinus syndrome s/p pacemaker placement, HIV on Biktarvy, CAD, GERD, iron deficiency anemia and small bowel AVMs admitted for symptomatic anemia.   Symptomatic anemia Acute GI bleed Iron deficiency anemia Patient with history of small bowel AVMs and iron deficiency anemia admitted with Hb of 5. S/p 2u pRBC with Hb of 8.4. She has received one IV infusion yesterday. Patient for EGD and colonoscopy today.  - GI consulted, appreciate their recommendations - IV protonix 40mg  bid - f/u EGD and colonoscopy results  - Trend CBC  Acute respiratory failure 2/2 COPD exacerbation  Patient currently on room air. Cough is improved and no longer productive. She is maintaining O2 saturations >95%. Notes improvement in symptoms overall.  - Continue azithromycin 500mg  day 3/5 - Prednisone 40mg  daily day 3/5 - Duonebs tid - Tessalon 100mg  tid prn  Hypertension CAD:  - Amlodipine 10mg  daily - IMDUR 60mg  daily - Lisinopril 40mg  daily - Spironolactone 25mg  daily  FEN/GI Diet: NPO for endoscopy; can advance as tolerated following  Fluids: None Electrolytes: Monitor and replete prn  DVT Prophylaxis: SCDs Code status: FULL  Prior to Admission Living Arrangement: Home Anticipated Discharge Location: Home Barriers to Discharge: pending EGD/colonoscopy  results  Dispo: Anticipated discharge in approximately 0-1 day(s).   Harvie Heck, MD  Internal Medicine, PGY-1 06/05/2020, 8:13 AM Pager: [phone] After 5pm on weekdays and 1pm on weekends: On Call pager [phone]

## 2020-06-05 NOTE — Op Note (Signed)
[facility] Patient Name: Nicole Mccormick Procedure Date : 06/05/2020 MRN: [id number] Attending MD: Arta Silence , MD Date of Birth: 31-Jan-1946 CSN: [id number] Age: 74 Admit Type: Inpatient Procedure:                Colonoscopy Indications:              Last colonoscopy: 2015, Iron deficiency anemia Providers:                Arta Silence, MD, Josie Dixon, RN, Elspeth Cho Tech., Technician, Rejeana Brock, CRNA Referring MD:             [facility] Medicines:                Monitored Anesthesia Care Complications:            No immediate complications. Estimated Blood Loss:     Estimated blood loss: none. Procedure:                Pre-Anesthesia Assessment:                           - Prior to the procedure, a History and Physical                            was performed, and patient medications and                            allergies were reviewed. The patient's tolerance of                            previous anesthesia was also reviewed. The risks                            and benefits of the procedure and the sedation                            options and risks were discussed with the patient.                            All questions were answered, and informed consent                            was obtained. Prior Anticoagulants: The patient has                            taken no previous anticoagulant or antiplatelet                            agents. ASA Grade Assessment: III - A patient with                            severe systemic disease. After reviewing the risks  and benefits, the patient was deemed in                            satisfactory condition to undergo the procedure.                           After obtaining informed consent, the colonoscope                            was passed under direct vision. Throughout the                            procedure, the patient's blood pressure,  pulse, and                            oxygen saturations were monitored continuously. The                            [id number]-H190DL ([id number] Olympus pediatric colonoscope                            was introduced through the anus and advanced to the                            the cecum, identified by appendiceal orifice and                            ileocecal valve. The ileocecal valve, appendiceal                            orifice, and rectum were photographed. The entire                            colon was examined. The colonoscopy was performed                            without difficulty. The patient tolerated the                            procedure well. The quality of the bowel                            preparation was good. Scope In: 10:39:44 AM Scope Out: 11:03:04 AM Scope Withdrawal Time: 0 hours 10 minutes 37 seconds  Total Procedure Duration: 0 hours 23 minutes 20 seconds  Findings:      Hemorrhoids were found on perianal exam.      Internal hemorrhoids were found during retroflexion. The hemorrhoids       were moderate.      No additional abnormalities were found on retroflexion.      Two sessile polyps were found in the sigmoid colon and ascending colon.       The polyps were 4 to 5 mm in size. These polyps were removed with a cold       snare. Resection and retrieval were complete.  Colon otherwise normal; no other polyps, masses, vascular ectasias, or       inflammatory changes were seen.      No old or fresh blood was seen to the extent of our examination. Impression:               - Hemorrhoids found on perianal exam.                           - Internal hemorrhoids.                           - Two 4 to 5 mm polyps in the sigmoid colon and in                            the ascending colon, removed with a cold snare.                            Resected and retrieved.                           - Suspect anemia likely from very slow losses from                             small bowel AVMs (seen capsule 2015); no obvious                            source bleeding on today's endoscopy and                            colonoscopy. Moderate Sedation:      None Recommendation:           - Return patient to hospital ward for observation.                           - Clear liquid diet today.                           - Await pathology results.                           - Repeat colonoscopy (date not yet determined) for                            surveillance based on pathology results.                           - Would do QD PPI, minimize NSAIDs/anticoagulants                            as possible.                           - Manage anemia supportively; no further GI  inpatient procedures planned; if anemia persists as                            outpatient despite oral (+/- parenteral) iron,                            would consider outpatient capsule endoscopy.                           [facility] will follow. Procedure Code(s):        --- Professional ---                           [id number], Colonoscopy, flexible; with removal of                            tumor(s), polyp(s), or other lesion(s) by snare                            technique Diagnosis Code(s):        --- Professional ---                           K64.8, Other hemorrhoids                           K63.5, Polyp of colon                           D50.9, Iron deficiency anemia, unspecified CPT copyright 2019 American Medical Association. All rights reserved. The codes documented in this report are preliminary and upon coder review may  be revised to meet current compliance requirements. Arta Silence, MD 06/05/2020 11:19:21 AM This report has been signed electronically. Number of Addenda: 0

## 2020-06-05 NOTE — TOC Initial Note (Addendum)
Transition of Care [facility]) - Initial/Assessment Note    Patient Details  Name: Nicole Mccormick MRN: [id number] Date of Birth: 1946-09-20  [facility] CM/SW Contact:    Ninfa Meeker, RN Phone Number: [phone] (working remotely) 06/05/2020, 1:56 PM  Clinical Narrative: Patient is a 74 y.o. female with past medical history of coronary artery disease, history of sick sinus syndrome status post pacemaker placement. Presented to the hospital with shortness of breath.  She was found to have severe anemia with hemoglobin of 5, transfused 2 units, HGB today is 8.4. Case manager spoke with patient via telephone to discuss discharge needs. Discuss choice for [place], Referral called [facility] Patient states she will have family support at discharge.   Expected Discharge Plan: [place] Barriers to discharge: None  Patient Goals and CMS Choice Patient states their goals for this hospitalization and ongoing recovery are:: to go home CMS Medicare.gov Compare Post Acute Care list provided to:: Patient Choice offered to / list presented to : Patient (discussed via telephone)  Expected Discharge Plan and Services Expected Discharge Plan: [place] In-house Referral: NA Discharge Planning Services: CM Consult Post Acute Care Choice: Durable Medical Equipment, Home Health Living arrangements for the past 2 months: Single Family Home                 DME Arranged: 3-N-1, Walker rolling with seat DME Agency: [employer] Date DME Agency Contacted: 06/05/20 Time DME Agency Contacted: 24 Representative spoke with at DME Agency: [place]: PT [place]: [place] Date [place]: 06/05/20 Time [place]: 1330 Representative spoke with at [place]: Jenny Reichmann  Prior Living Arrangements/Services Living arrangements for the past 2 months: [place] Lives with::  Relatives Patient language and need for interpreter reviewed:: Yes Do you feel safe going back to the place where you live?: Yes      Need for Family Participation in Patient Care: Yes (Comment) Care giver support system in place?: Yes (comment)   Criminal Activity/Legal Involvement Pertinent to Current Situation/Hospitalization: No - Comment as needed  Activities of Daily Living      Permission Sought/Granted Permission sought to share information with : Case Manager       Permission granted to share info w AGENCY: Alvis Lemmings        Emotional Assessment   Attitude/Demeanor/Rapport: Set designer, Engaged   Orientation: : Oriented to Self, Oriented to Place, Oriented to  Time, Oriented to Situation Alcohol / Substance Use: Not Applicable Psych Involvement: No (comment)  Admission diagnosis:  Shortness of breath [R06.02] Symptomatic anemia [D64.9] Patient Active Problem List   Diagnosis Date Noted  . Dyspnea   . Shortness of breath   . Symptomatic anemia 06/03/2020  . Chronic arthralgias of knees and hips 07/06/2018  . Sick sinus syndrome ([place]) 02/28/2016  . Hypertensive cardiovascular disease 02/28/2016  . Bilateral carotid bruits 02/12/2016  . LVH (left ventricular hypertrophy) 01/15/2016  . GI bleed 11/07/2015  . Anxiety 07/22/2015  . Seasonal allergies 07/22/2015  . Asthma, chronic 07/22/2015  . Health care maintenance 02/17/2015  . Hepatitis C 08/07/2014  . Back pain 10/28/2012  . LGSIL Pap smear of vagina 01/16/2012  . Anal condylomata 11/18/2011  . CAD (coronary artery disease) 08/07/2011  . Essential hypertension, benign 12/25/2010  . Human immunodeficiency virus (HIV) disease ([place]) 01/23/2007  . TOBACCO ABUSE 01/23/2007  . CATARACT NEC 01/23/2007  .  Gastroesophageal reflux disease 01/23/2007   PCP:  Sonia Side., FNP Pharmacy:   [facility] [place], [address] AT [address] [place] [address] Phone: [phone] Fax: [phone]     Social Determinants of Health (SDOH) Interventions    Readmission Risk Interventions No flowsheet data found.

## 2020-06-05 NOTE — Discharge Instructions (Signed)
Shortness of Breath, Adult Shortness of breath means you have trouble breathing. Shortness of breath could be a sign of a medical problem. Follow these instructions at home:   Watch for any changes in your symptoms.  Do not use any products that contain nicotine or tobacco, such as cigarettes, e-cigarettes, and chewing tobacco.  Do not smoke. Smoking can cause shortness of breath. If you need help to quit smoking, ask your doctor.  Avoid things that can make it harder to breathe, such as: ? Mold. ? Dust. ? Air pollution. ? Chemical smells. ? Things that can cause allergy symptoms (allergens), if you have allergies.  Keep your living space clean. Use products that help remove mold and dust.  Rest as needed. Slowly return to your normal activities.  Take over-the-counter and prescription medicines only as told by your doctor. This includes oxygen therapy and inhaled medicines.  Keep all follow-up visits as told by your doctor. This is important. Contact a doctor if:  Your condition does not get better as soon as expected.  You have a hard time doing your normal activities, even after you rest.  You have new symptoms. Get help right away if:  Your shortness of breath gets worse.  You have trouble breathing when you are resting.  You feel light-headed or you pass out (faint).  You have a cough that is not helped by medicines.  You cough up blood.  You have pain with breathing.  You have pain in your chest, arms, shoulders, or belly (abdomen).  You have a fever.  You cannot walk up stairs.  You cannot exercise the way you normally do. These symptoms may represent a serious problem that is an emergency. Do not wait to see if the symptoms will go away. Get medical help right away. Call your local emergency services (911 in the [place] Do not drive yourself to the hospital. Summary  Shortness of breath is when you have trouble breathing enough air. It can be a sign of a  medical problem.  Avoid things that make it hard for you to breathe, such as smoking, pollution, mold, and dust.  Watch for any changes in your symptoms. Contact your doctor if you do not get better or you get worse. This information is not intended to replace advice given to you by your health care provider. Make sure you discuss any questions you have with your health care provider. Document Revised: 04/27/2018 Document Reviewed: 04/27/2018 Elsevier Patient Education  2020 Elsevier Inc.  

## 2020-06-05 NOTE — Evaluation (Signed)
Occupational Therapy Evaluation Patient Details Name: Nicole Mccormick MRN: [id number] DOB: 1946-02-18 Today's Date: 06/05/2020    History of Present Illness Anette Barra is a 74 y.o. female with past medical history of coronary artery disease, history of sick sinus syndrome status post pacemaker placement, history of HIV and hepatitis C presented to the hospital with shortness of breath.  She was found to have severe anemia with hemoglobin of 5.   Clinical Impression   Pt PTA: pt living with spouse and grandchildren. Pt reports staying busy keeping grandchildren and independent with ADL and mobility. Pt currently performing ADL tasks with modified independence; mobility in room with no AD with modified independence. VSS on RA; 69 BPM, O2 on 98% on RA. Pt has 13 steps to apartment and would benefit from ILF or level entry entrance. Pt reports that she and her family plan to assist with finding a place. Pt does not require continued OT skilled services. OT signing off.       Follow Up Recommendations  No OT follow up    Equipment Recommendations  None recommended by OT    Recommendations for Other Services       Precautions / Restrictions Precautions Precautions: Fall Restrictions Weight Bearing Restrictions: No      Mobility Bed Mobility Overal bed mobility: Independent                Transfers Overall transfer level: Independent                    Balance Overall balance assessment: Needs assistance Sitting-balance support: No upper extremity supported;Feet supported Sitting balance-Leahy Scale: Good     Standing balance support: Single extremity supported;During functional activity Standing balance-Leahy Scale: Good Standing balance comment: performing ADL tasks at sink                           ADL either performed or assessed with clinical judgement   ADL Overall ADL's : Needs assistance/impaired;Modified independent                                      Functional mobility during ADLs: Supervision/safety;Cueing for safety General ADL Comments: Pt performing own bed mobility, transfers and ADL functional tasks at sink. Pt transferring on/off commode with no difficulty. Pt standing at sink for grooming tasks x5 mins for ADL tasks.      Vision Baseline Vision/History: No visual deficits Patient Visual Report: No change from baseline Vision Assessment?: No apparent visual deficits     Perception     Praxis      Pertinent Vitals/Pain Pain Assessment: No/denies pain     Hand Dominance Right   Extremity/Trunk Assessment Upper Extremity Assessment Upper Extremity Assessment: Overall WFL for tasks assessed   Lower Extremity Assessment Lower Extremity Assessment: Overall WFL for tasks assessed   Cervical / Trunk Assessment Cervical / Trunk Assessment: Normal   Communication Communication Communication: No difficulties   Cognition Arousal/Alertness: Awake/alert Behavior During Therapy: WFL for tasks assessed/performed Overall Cognitive Status: Within Functional Limits for tasks assessed                                     General Comments  VSS on RA; 69 BPM, O2 on 98% on RA    Exercises  Shoulder Instructions      Home Living Family/patient expects to be discharged to:: Private residence Living Arrangements: Spouse/significant other Available Help at Discharge: Family;Available 24 hours/day Type of Home: Apartment Home Access: Level entry Entrance Stairs-Number of Steps: 1 Entrance Stairs-Rails: None Home Layout: Two level Alternate Level Stairs-Number of Steps: 13 Alternate Level Stairs-Rails: Right (no rails on second part) Bathroom Shower/Tub: Teacher, early years/pre: Standard     Home Equipment: Shower seat   Additional Comments: husband cannot assist pt as his balance is impaired. Children work but stop by as often as able.       Prior  Functioning/Environment Level of Independence: Needs assistance  Gait / Transfers Assistance Needed: used no device PTA ADL's / Homemaking Assistance Needed: Pt B/D self, children assist prn for IADLs; pt does drive            OT Problem List: Decreased activity tolerance      OT Treatment/Interventions:      OT Goals(Current goals can be found in the care plan section) Acute Rehab OT Goals Patient Stated Goal: to go home OT Goal Formulation: With patient  OT Frequency:     Barriers to D/C:            Co-evaluation              AM-PAC OT "6 Clicks" Daily Activity     Outcome Measure Help from another person eating meals?: None Help from another person taking care of personal grooming?: None Help from another person toileting, which includes using toliet, bedpan, or urinal?: None Help from another person bathing (including washing, rinsing, drying)?: None Help from another person to put on and taking off regular upper body clothing?: None Help from another person to put on and taking off regular lower body clothing?: None 6 Click Score: 24   End of Session Nurse Communication: Mobility status  Activity Tolerance: Patient tolerated treatment well Patient left: in bed;with call bell/phone within reach  OT Visit Diagnosis: Unsteadiness on feet (R26.81);Muscle weakness (generalized) (M62.81)                Time: 0802-0820 OT Time Calculation (min): 18 min Charges:  OT General Charges $OT Visit: 1 Visit OT Evaluation $OT Eval Moderate Complexity: 1 Mod  Jefferey Pica, OTR/L Acute Rehabilitation Services Pager: [phone] Office: [phone]   [id number] C 06/05/2020, 2:21 PM

## 2020-06-05 NOTE — Discharge Summary (Signed)
Name: Nicole Mccormick MRN: [id number] DOB: 03/31/1946 74 y.o. PCP: Sonia Side., FNP  Date of Admission: 06/03/2020  8:44 AM Date of Discharge: 06/05/20 Attending Physician: Joni Reining  Discharge Diagnosis: 1. Symptomatic anemia due to chronic bleed 2. COPD exacerbation  Discharge Medications: Allergies as of 06/05/2020      Reactions   Penicillins    REACTION: GOES INTO SHOCK & THEN PASSES OU Has patient had a PCN reaction causing immediate rash, facial/tongue/throat swelling, SOB or lightheadedness with hypotension: NO Has patient had a PCN reaction causing severe rash involving mucus membranes or skin necrosis: NO Has patient had a PCN reaction that required hospitalization NO Has patient had a PCN reaction occurring within the last 10 years: NO If all of the above answers are "NO", then may proceed with Cephalosporin use.   Avelox [moxifloxacin Hcl In Nacl] Itching, Rash      Medication List    STOP taking these medications   famotidine 20 MG tablet Commonly known as: PEPCID     TAKE these medications   acyclovir 400 MG tablet Commonly known as: ZOVIRAX Take 1 tablet (400 mg total) by mouth daily as needed (for flare ups).   albuterol (2.5 MG/3ML) 0.083% nebulizer solution Commonly known as: PROVENTIL Take 3 mLs (2.5 mg total) by nebulization every 6 (six) hours as needed for wheezing or shortness of breath. What changed: Another medication with the same name was changed. Make sure you understand how and when to take each.   albuterol 108 (90 Base) MCG/ACT inhaler Commonly known as: ProAir HFA INHALE 2 PUFFS BY MOUTH EVERY 4 HOURS IF NEEDED FOR WHEEZING OR SHORTNESS OF BREATH What changed:   how much to take  how to take this  when to take this  reasons to take this   amLODipine 10 MG tablet Commonly known as: NORVASC TAKE 1 TABLET BY MOUTH EVERY DAY Notes to patient: Decreases chest pain  Lowers blood pressure    atorvastatin 40 MG tablet Commonly  known as: LIPITOR take 1 tablet by mouth once daily AT 6 PM What changed:   how much to take  how to take this  when to take this Notes to patient: Lowers cholesterol and triglycerides   azithromycin 250 MG tablet Commonly known as: ZITHROMAX Take 1 tablet daily for 3 days Start taking on: June 06, 2020 Notes to patient: Antibiotic  Take until medication is gone   Biktarvy 50-200-25 MG Tabs tablet Generic drug: bictegravir-emtricitabine-tenofovir AF TAKE 1 TABLET BY MOUTH EVERY DAY   carvedilol 3.125 MG tablet Commonly known as: COREG TAKE 1 TABLET(3.125 MG) BY MOUTH TWICE DAILY What changed: See the new instructions. Notes to patient: Lowers work of the heart Lowers blood pressure and heart rate Treats heart failure   Cetirizine HCl 10 MG Tbdp Commonly known as: ZyrTEC Allergy Take 1 tablet by mouth daily. Notes to patient: Treats allergy symptoms   cholecalciferol 1000 units tablet Commonly known as: VITAMIN D Take 1,000 Units by mouth daily. Notes to patient: Supplement    cyclobenzaprine 5 MG tablet Commonly known as: FLEXERIL TAKE 1 TABLET BY MOUTH TWICE DAILY AS NEEDED FOR MUSCLE SPASM   Delsym 30 MG/5ML liquid Generic drug: dextromethorphan Take 30 mg by mouth as needed for cough.   diazepam 2 MG tablet Commonly known as: VALIUM take 1 tablet by mouth every 6 hours if needed for anxiety   escitalopram 10 MG tablet Commonly known as: LEXAPRO Take 1 tablet by mouth daily  as needed for anxiety.   ferrous sulfate 325 (65 FE) MG EC tablet Take 325 mg by mouth daily. Notes to patient: Supplement    Fish Oil 1000 MG Cpdr Take 1,000 mg by mouth daily. Notes to patient: Supplement    Flax Seed Oil 1000 MG Caps Take 1,000 mg by mouth daily. Notes to patient: Supplement    gabapentin 600 MG tablet Commonly known as: NEURONTIN TAKE 1 TABLET BY MOUTH THREE TIMES DAILY   hydrochlorothiazide 12.5 MG tablet Commonly known as: HYDRODIURIL Take 12.5 mg  by mouth daily. Notes to patient: Weak fluid pill   HYDROcodone-acetaminophen 5-325 MG tablet Commonly known as: Norco Take 1 tablet by mouth every 6 (six) hours as needed for moderate pain.   isosorbide mononitrate 60 MG 24 hr tablet Commonly known as: IMDUR Take 1 tablet (60 mg total) by mouth daily. Notes to patient: Increases blood flow to the heart  Lowers blood pressure    lisinopril 40 MG tablet Commonly known as: ZESTRIL Take 40 mg by mouth daily. Notes to patient: Lowers blood pressure   ondansetron 4 MG disintegrating tablet Commonly known as: ZOFRAN-ODT Take 1 tablet (4 mg total) by mouth daily as needed for nausea or vomiting.   pantoprazole 40 MG tablet Commonly known as: PROTONIX Take 1 tablet (40 mg total) by mouth daily. Notes to patient: Treat acid reflux  Prevents heartburn   predniSONE 20 MG tablet Commonly known as: DELTASONE Take 2 tablets (40 mg total) by mouth daily with breakfast for 3 days. Start taking on: June 06, 2020   RA Aspirin EC Adult Low St 81 MG EC tablet Generic drug: aspirin Take 1 tablet (81 mg total) by mouth daily.   spironolactone 25 MG tablet Commonly known as: ALDACTONE TAKE 1 TABLET BY MOUTH EVERY DAY Notes to patient: Weak fluid pill    triamcinolone cream 0.1 % Commonly known as: KENALOG Apply 1 application topically 2 (two) times daily. What changed:   when to take this  reasons to take this   vitamin A 10000 UNIT capsule Take 10,000 Units by mouth daily. Notes to patient: Supplement    vitamin B-12 1000 MCG tablet Commonly known as: CYANOCOBALAMIN Take 1,000 mcg by mouth daily. Notes to patient: Supplement    vitamin C 1000 MG tablet Take 1,000 mg by mouth daily. Notes to patient: Supplement             Durable Medical Equipment  (From admission, onward)         Start     Ordered   06/05/20 1355  DME Walker  Once       Question Answer Comment  Walker: With 5 Inch Wheels   Patient needs a  walker to treat with the following condition Shortness of breath      06/05/20 1359   06/05/20 1327  For home use only DME 3 n 1  Once        06/05/20 1327   06/05/20 1325  For home use only DME Walker rolling  Once       Comments: Needs rollator. Height 5'2"  Question Answer Comment  Walker: With [place] Wheels   Patient needs a walker to treat with the following condition Generalized weakness      06/05/20 1327          Disposition and follow-up:   Ms.Reylene Basquez was discharged from [facility] in Stable condition.  At the hospital follow up visit please address:  1. Symptomatic anemia due to chronic bleed - Check cbc - Assess for any return of bleed - F/u pathology results - Schedule patient for 2nd IV iron infusion after 06/10/20  2. COPD exacerbation - Ensure she finished her course of prednisone and azithromycin for 3 additional days - Consider starting maintenance inhaler therapy with LAMA  2.  Labs / imaging needed at time of follow-up: cbc, bmp  3.  Pending labs/ test needing follow-up: EGD/Colonoscopy pathology  Follow-up Appointments:  Follow-up Information    Sonia Side., FNP. Call.   Specialty: Family Medicine Contact information: [place] [address] [phone]        Campbell Riches, MD .   Specialty: Infectious Diseases Contact information: [place] [address] [phone]        Dorothy Spark, MD .   Specialty: Cardiology Contact information: [address] [place] [address] [phone]        Thompson Grayer, MD .   Specialty: Cardiology Contact information: [address] [place] [address] [phone]        Care, [facility] Follow up.   Specialty: [place] Why: A representative from [facility] will contact you to arrange start date and time for your therapy. Contact information: [address] [place] [address] [phone]               Hospital Course by problem list:  1. Symptomatic anemia due to chronic bleed: Ms.Hislop is a 74 yo F w/ PMH of CAD, sick sinus syndrome s/p pacemaker, iron deficiency anemia, HIV on Biktarvy and prior hx of small bowel AVMs presenting to [facility] w/ complaint of progressive weakness. She was found to have hemoglobin of 5 and ferritin of 6. She received two units of pRBCs and Feraheme IV infusion. GI was consulted who performed EGD and colonoscopy showing no active bleed but small colon polyps which were biopsied. Her anemia was presumed to be from chronic bleed from small bowel AVMs seen back in 2016. She was discharged on hospital day 3 after hgb remained stable at 8.5.  2. COPD exacerbation: On hospital day 1, noted to have significant respiratory distress requiring BiPap. Initially thought to be due to TRALI but pathology labs came back negative for transfusion reaction. Treated w/ steroids and azithromycin for COPD exacerbation. Treated w/ 3 additional days to complete 5 day course.  Discharge Vitals:   BP (!) 164/73 (BP Location: Left Arm)   Pulse 60   Temp 97.8 F (36.6 C) (Oral)   Resp 18   Ht 5\' 2"  (1.575 m)   Wt 56.8 kg   SpO2 98%   BMI 22.92 kg/m   Pertinent Labs, Studies, and Procedures:  CBC Latest Ref Rng & Units 06/05/2020 06/04/2020 06/03/2020  WBC 4.0 - 10.5 K/uL 6.9 2.7(L) 5.1  Hemoglobin 12.0 - 15.0 g/dL 8.4(L) 8.5(L) 9.2(L)  Hematocrit 36 - 46 % 27.8(L) 28.0(L) 30.1(L)  Platelets 150 - 400 K/uL 209 198 218   BMP Latest Ref Rng & Units 06/05/2020 06/04/2020 06/03/2020  Glucose 70 - 99 mg/dL 104(H) 141(H) 104(H)  BUN 8 - 23 mg/dL 9 10 12   Creatinine 0.44 - 1.00 mg/dL 0.74 0.81 0.80  BUN/Creat Ratio 6 - 22 (calc) - - -  Sodium 135 - 145 mmol/L 139 135 138  Potassium 3.5 - 5.1 mmol/L 3.3(L) 3.8 3.7  Chloride 98 - 111  mmol/L 104 101 104  CO2 22 - 32 mmol/L 27 25 25   Calcium 8.9 - 10.3 mg/dL 9.1 8.9  8.9   CHEST - 2 VIEW  FINDINGS: Mild enlargement of the cardiopericardial silhouette. No mediastinal or hilar masses or evidence of adenopathy. Stable left anterior chest wall sequential pacemaker.  Lungs are hyperexpanded, but clear. No pleural effusion or pneumothorax.  Mild compression deformities of 2 adjacent mid to upper thoracic vertebra, stable from the prior chest CT. No acute skeletal abnormality.  IMPRESSION: 1. No acute cardiopulmonary disease.  Discharge Instructions: Discharge Instructions    Call MD for:  difficulty breathing, headache or visual disturbances   Complete by: As directed    Call MD for:  extreme fatigue   Complete by: As directed    Call MD for:  hives   Complete by: As directed    Call MD for:  persistant dizziness or light-headedness   Complete by: As directed    Call MD for:  persistant nausea and vomiting   Complete by: As directed    Call MD for:  severe uncontrolled pain   Complete by: As directed    Call MD for:  temperature >100.4   Complete by: As directed    Diet - low sodium heart healthy   Complete by: As directed    Discharge instructions   Complete by: As directed    Ms. Lamoine, Fredricksen were admitted to the hospital with severely low hemoglobin levels and shortness of breath.  1. For your anemia - You received blood transfusion and iron infusion with improvement in your hemoglobin levels. You underwent and EGD and colonoscopy that showed small polyps that were removed- On discharge, please continue to take Protonix 40mg  daily.  2. For your shortness of breath - we suspect this was secondary to a COPD exacerbation. You were treated with breathing treatments, steroids and antibiotics. On discharge, please continue to take azithromycin and prednisone for two more days to complete a 5 day course of treatment. Recommend smoking cessation at this time.  Please follow up with your PCP. You may benefit from further testing with pulmonary  function tests for official diagnosis and long term management of your COPD.   Please continue to take all other medications as prescribed   Thank you!   Increase activity slowly   Complete by: As directed      Signed: Mosetta Anis, MD 06/05/2020, 6:25 PM Pager: [phone] After 5pm on weekdays and 1pm on weekends: On Call Pager: [phone]

## 2020-06-06 LAB — SURGICAL PATHOLOGY

## 2020-06-06 LAB — TRANSFUSION REACTION
DAT C3: NEGATIVE
Post RXN DAT IgG: NEGATIVE

## 2020-06-06 NOTE — Telephone Encounter (Signed)
Returned call to Merrilee Seashore at [facility]. States he received a corrected Rx yesterday from Dr. Truman Hayward. Nothing further needed at this time. Hubbard Hartshorn, BSN, RN-BC

## 2020-06-07 ENCOUNTER — Encounter (HOSPITAL_COMMUNITY): Payer: Self-pay | Admitting: Gastroenterology

## 2020-06-16 ENCOUNTER — Other Ambulatory Visit: Payer: Self-pay | Admitting: Family

## 2020-06-16 DIAGNOSIS — E2839 Other primary ovarian failure: Secondary | ICD-10-CM

## 2020-08-04 ENCOUNTER — Ambulatory Visit: Payer: Medicare Other | Admitting: Infectious Diseases

## 2020-08-08 ENCOUNTER — Ambulatory Visit: Payer: Medicaid Other | Admitting: Infectious Diseases

## 2020-08-11 ENCOUNTER — Other Ambulatory Visit: Payer: Self-pay | Admitting: Student

## 2020-08-11 DIAGNOSIS — Z8669 Personal history of other diseases of the nervous system and sense organs: Secondary | ICD-10-CM

## 2020-08-11 DIAGNOSIS — R42 Dizziness and giddiness: Secondary | ICD-10-CM

## 2020-08-23 ENCOUNTER — Other Ambulatory Visit: Payer: Self-pay

## 2020-08-23 ENCOUNTER — Telehealth: Payer: Self-pay

## 2020-08-23 ENCOUNTER — Ambulatory Visit (INDEPENDENT_AMBULATORY_CARE_PROVIDER_SITE_OTHER): Payer: 59

## 2020-08-23 ENCOUNTER — Ambulatory Visit (INDEPENDENT_AMBULATORY_CARE_PROVIDER_SITE_OTHER): Payer: 59 | Admitting: Infectious Diseases

## 2020-08-23 ENCOUNTER — Encounter: Payer: Self-pay | Admitting: Infectious Diseases

## 2020-08-23 VITALS — BP 136/75 | HR 72 | Temp 97.9°F | Ht 62.0 in | Wt 122.0 lb

## 2020-08-23 DIAGNOSIS — Z113 Encounter for screening for infections with a predominantly sexual mode of transmission: Secondary | ICD-10-CM | POA: Diagnosis not present

## 2020-08-23 DIAGNOSIS — R001 Bradycardia, unspecified: Secondary | ICD-10-CM

## 2020-08-23 DIAGNOSIS — M1990 Unspecified osteoarthritis, unspecified site: Secondary | ICD-10-CM

## 2020-08-23 DIAGNOSIS — Z79899 Other long term (current) drug therapy: Secondary | ICD-10-CM | POA: Diagnosis not present

## 2020-08-23 DIAGNOSIS — B20 Human immunodeficiency virus [HIV] disease: Secondary | ICD-10-CM | POA: Diagnosis not present

## 2020-08-23 NOTE — Telephone Encounter (Signed)
Received verbal order per Dr. Johnnye Sima to have patient return to ED or Urgent care for repeat lab draw for Hemoglobin. Patient called and made aware. Patient verbalized understanding.  Nicole Mccormick

## 2020-08-23 NOTE — Assessment & Plan Note (Addendum)
She is doing well PCV 23 today Arrange mammo Dental referral placed today for [facility]. Information to schedule appointment completed today. Labs today See if we can get her ensure from [facility] (BMI is 22) rtc 9 months

## 2020-08-23 NOTE — Telephone Encounter (Signed)
Received call from [facility] with reports of low hemoglobin of 5.4 Routing message to provider to make aware. Eugenia Mcalpine

## 2020-08-23 NOTE — Progress Notes (Signed)
   Subjective:    Patient ID: Nicole Mccormick, female    DOB: [birth year], 74 y.o.   MRN: [id number]  HPI  74 yo F with hx of HTN and HIV+. She is also long term smoker.  She had episodes of bradycardia over the winter and had pacer placed 02-05-18.  Was changed to biktarvy 06-2018 from tivicay-descovey.  Has still been having joint pains.  Has been hungry but can't eat. Has been using ensure. Has lost #19. Was prev taking hydrocodone prn for pain "all over".  Now living in single story dwelling.   She was in hospital June 2021 with anemia- she had colonoscopy (4-5 polyps, hemmeroids) and EGD (avm).   HIV 1 RNA Quant (copies/mL)  Date Value  07/06/2018 <20 NOT DETECTED  11/10/2017 <20 NOT DETECTED  05/15/2017 <20 NOT DETECTED   CD4 T Cell Abs (/uL)  Date Value  06/05/2020 389 (L)  07/06/2018 400  11/10/2017 520    Review of Systems  Constitutional: Positive for appetite change and unexpected weight change. Negative for chills and fever.  Respiratory: Negative for cough and shortness of breath.   Gastrointestinal: Negative for diarrhea and nausea.  Genitourinary: Negative for difficulty urinating.  Musculoskeletal: Positive for arthralgias.  Neurological: Negative for headaches.  Please see HPI. All other systems reviewed and negative.      Objective:   Physical Exam Vitals reviewed.  Constitutional:      Appearance: Normal appearance.  HENT:     Mouth/Throat:     Mouth: Mucous membranes are moist.     Pharynx: No oropharyngeal exudate.  Eyes:     Extraocular Movements: Extraocular movements intact.     Pupils: Pupils are equal, round, and reactive to light.  Neurological:     Mental Status: She is alert.           Assessment & Plan:

## 2020-08-23 NOTE — Assessment & Plan Note (Signed)
Appreciate rheum f/u Defer her hydrocodone refill to them.

## 2020-08-24 ENCOUNTER — Encounter (HOSPITAL_COMMUNITY): Payer: Self-pay | Admitting: *Deleted

## 2020-08-24 ENCOUNTER — Inpatient Hospital Stay (HOSPITAL_COMMUNITY)
Admission: EM | Admit: 2020-08-24 | Discharge: 2020-08-26 | DRG: 377 | Disposition: A | Discharge status: Home / Self Care | Payer: 59 | Attending: Internal Medicine | Admitting: Internal Medicine

## 2020-08-24 DIAGNOSIS — Z950 Presence of cardiac pacemaker: Secondary | ICD-10-CM | POA: Diagnosis not present

## 2020-08-24 DIAGNOSIS — D649 Anemia, unspecified: Secondary | ICD-10-CM

## 2020-08-24 DIAGNOSIS — Z800 Family history of malignant neoplasm of digestive organs: Secondary | ICD-10-CM

## 2020-08-24 DIAGNOSIS — Z880 Allergy status to penicillin: Secondary | ICD-10-CM

## 2020-08-24 DIAGNOSIS — Z7951 Long term (current) use of inhaled steroids: Secondary | ICD-10-CM

## 2020-08-24 DIAGNOSIS — K219 Gastro-esophageal reflux disease without esophagitis: Secondary | ICD-10-CM | POA: Diagnosis present

## 2020-08-24 DIAGNOSIS — Z21 Asymptomatic human immunodeficiency virus [HIV] infection status: Secondary | ICD-10-CM | POA: Diagnosis present

## 2020-08-24 DIAGNOSIS — I10 Essential (primary) hypertension: Secondary | ICD-10-CM | POA: Diagnosis present

## 2020-08-24 DIAGNOSIS — E785 Hyperlipidemia, unspecified: Secondary | ICD-10-CM | POA: Diagnosis present

## 2020-08-24 DIAGNOSIS — B20 Human immunodeficiency virus [HIV] disease: Secondary | ICD-10-CM | POA: Diagnosis present

## 2020-08-24 DIAGNOSIS — K31811 Angiodysplasia of stomach and duodenum with bleeding: Principal | ICD-10-CM | POA: Diagnosis present

## 2020-08-24 DIAGNOSIS — K644 Residual hemorrhoidal skin tags: Secondary | ICD-10-CM | POA: Diagnosis present

## 2020-08-24 DIAGNOSIS — I2510 Atherosclerotic heart disease of native coronary artery without angina pectoris: Secondary | ICD-10-CM | POA: Diagnosis present

## 2020-08-24 DIAGNOSIS — E869 Volume depletion, unspecified: Secondary | ICD-10-CM | POA: Diagnosis present

## 2020-08-24 DIAGNOSIS — Q2730 Arteriovenous malformation, site unspecified: Secondary | ICD-10-CM

## 2020-08-24 DIAGNOSIS — K922 Gastrointestinal hemorrhage, unspecified: Secondary | ICD-10-CM | POA: Diagnosis present

## 2020-08-24 DIAGNOSIS — R571 Hypovolemic shock: Secondary | ICD-10-CM | POA: Diagnosis present

## 2020-08-24 DIAGNOSIS — M1990 Unspecified osteoarthritis, unspecified site: Secondary | ICD-10-CM | POA: Diagnosis present

## 2020-08-24 DIAGNOSIS — Z833 Family history of diabetes mellitus: Secondary | ICD-10-CM | POA: Diagnosis not present

## 2020-08-24 DIAGNOSIS — F39 Unspecified mood [affective] disorder: Secondary | ICD-10-CM | POA: Diagnosis present

## 2020-08-24 DIAGNOSIS — F17200 Nicotine dependence, unspecified, uncomplicated: Secondary | ICD-10-CM | POA: Diagnosis present

## 2020-08-24 DIAGNOSIS — G40909 Epilepsy, unspecified, not intractable, without status epilepticus: Secondary | ICD-10-CM | POA: Diagnosis present

## 2020-08-24 DIAGNOSIS — J449 Chronic obstructive pulmonary disease, unspecified: Secondary | ICD-10-CM | POA: Diagnosis present

## 2020-08-24 DIAGNOSIS — R0602 Shortness of breath: Secondary | ICD-10-CM

## 2020-08-24 DIAGNOSIS — Z8041 Family history of malignant neoplasm of ovary: Secondary | ICD-10-CM | POA: Diagnosis not present

## 2020-08-24 DIAGNOSIS — R001 Bradycardia, unspecified: Secondary | ICD-10-CM | POA: Diagnosis present

## 2020-08-24 DIAGNOSIS — D62 Acute posthemorrhagic anemia: Secondary | ICD-10-CM | POA: Diagnosis present

## 2020-08-24 DIAGNOSIS — Z20822 Contact with and (suspected) exposure to covid-19: Secondary | ICD-10-CM | POA: Diagnosis present

## 2020-08-24 DIAGNOSIS — Z6822 Body mass index (BMI) 22.0-22.9, adult: Secondary | ICD-10-CM

## 2020-08-24 DIAGNOSIS — Z888 Allergy status to other drugs, medicaments and biological substances status: Secondary | ICD-10-CM

## 2020-08-24 DIAGNOSIS — F17210 Nicotine dependence, cigarettes, uncomplicated: Secondary | ICD-10-CM | POA: Diagnosis present

## 2020-08-24 DIAGNOSIS — K31819 Angiodysplasia of stomach and duodenum without bleeding: Secondary | ICD-10-CM | POA: Diagnosis present

## 2020-08-24 DIAGNOSIS — G4710 Hypersomnia, unspecified: Secondary | ICD-10-CM | POA: Diagnosis present

## 2020-08-24 DIAGNOSIS — Z86010 Personal history of colonic polyps: Secondary | ICD-10-CM

## 2020-08-24 DIAGNOSIS — R630 Anorexia: Secondary | ICD-10-CM | POA: Diagnosis present

## 2020-08-24 DIAGNOSIS — Z79899 Other long term (current) drug therapy: Secondary | ICD-10-CM

## 2020-08-24 HISTORY — DX: Anemia, unspecified: D64.9

## 2020-08-24 LAB — CBC
HCT: 17.9 % — ABNORMAL LOW (ref 36.0–46.0)
HCT: 22 % — ABNORMAL LOW (ref 36.0–46.0)
Hemoglobin: 4.7 g/dL — CL (ref 12.0–15.0)
Hemoglobin: 6.1 g/dL — CL (ref 12.0–15.0)
MCH: 27.5 pg (ref 26.0–34.0)
MCH: 26.2 pg (ref 26.0–34.0)
MCHC: 26.3 g/dL — ABNORMAL LOW (ref 30.0–36.0)
MCHC: 27.7 g/dL — ABNORMAL LOW (ref 30.0–36.0)
MCV: 104.7 fL — ABNORMAL HIGH (ref 80.0–100.0)
MCV: 94.4 fL (ref 80.0–100.0)
Platelets: 234 10*3/uL (ref 150–400)
Platelets: 215 10*3/uL (ref 150–400)
RBC: 1.71 MIL/uL — ABNORMAL LOW (ref 3.87–5.11)
RBC: 2.33 MIL/uL — ABNORMAL LOW (ref 3.87–5.11)
RDW: 18.1 % — ABNORMAL HIGH (ref 11.5–15.5)
RDW: 22.2 % — ABNORMAL HIGH (ref 11.5–15.5)
WBC: 3.1 10*3/uL — ABNORMAL LOW (ref 4.0–10.5)
WBC: 3.8 10*3/uL — ABNORMAL LOW (ref 4.0–10.5)
nRBC: 0 % (ref 0.0–0.2)
nRBC: 0 % (ref 0.0–0.2)

## 2020-08-24 LAB — RETICULOCYTES
Immature Retic Fract: 29.4 % — ABNORMAL HIGH (ref 2.3–15.9)
RBC.: 1.81 MIL/uL — ABNORMAL LOW (ref 3.87–5.11)
Retic Count, Absolute: 95.4 10*3/uL (ref 19.0–186.0)
Retic Ct Pct: 5.3 % — ABNORMAL HIGH (ref 0.4–3.1)

## 2020-08-24 LAB — POC OCCULT BLOOD, ED: Fecal Occult Bld: POSITIVE — AB

## 2020-08-24 LAB — IRON AND TIBC
Iron: 11 ug/dL — ABNORMAL LOW (ref 28–170)
Saturation Ratios: 2 % — ABNORMAL LOW (ref 10.4–31.8)
TIBC: 550 ug/dL — ABNORMAL HIGH (ref 250–450)
UIBC: 539 ug/dL

## 2020-08-24 LAB — T-HELPER CELL (CD4) - (RCID CLINIC ONLY)
CD4 % Helper T Cell: 34 % (ref 33–65)
CD4 T Cell Abs: 395 /uL — ABNORMAL LOW (ref 400–1790)

## 2020-08-24 LAB — BASIC METABOLIC PANEL
Anion gap: 7 (ref 5–15)
BUN: 24 mg/dL — ABNORMAL HIGH (ref 8–23)
CO2: 23 mmol/L (ref 22–32)
Calcium: 8.8 mg/dL — ABNORMAL LOW (ref 8.9–10.3)
Chloride: 109 mmol/L (ref 98–111)
Creatinine, Ser: 1.05 mg/dL — ABNORMAL HIGH (ref 0.44–1.00)
GFR calc Af Amer: 60 mL/min — ABNORMAL LOW (ref >60)
GFR calc non Af Amer: 52 mL/min — ABNORMAL LOW (ref >60)
Glucose, Bld: 108 mg/dL — ABNORMAL HIGH (ref 70–99)
Potassium: 4.6 mmol/L (ref 3.5–5.1)
Sodium: 139 mmol/L (ref 135–145)

## 2020-08-24 LAB — FOLATE: Folate: 21 ng/mL (ref >5.9)

## 2020-08-24 LAB — PREPARE RBC (CROSSMATCH)

## 2020-08-24 LAB — SARS CORONAVIRUS 2 BY RT PCR (HOSPITAL ORDER, PERFORMED IN ~~LOC~~ HOSPITAL LAB): SARS Coronavirus 2: NEGATIVE

## 2020-08-24 LAB — FERRITIN: Ferritin: 6 ng/mL — ABNORMAL LOW (ref 11–307)

## 2020-08-24 LAB — VITAMIN B12: Vitamin B-12: 321 pg/mL (ref 180–914)

## 2020-08-24 MED ORDER — ONDANSETRON HCL 4 MG PO TABS: 4.0000 mg | ORAL_TABLET | Freq: Four times a day (QID) | ORAL | Status: DC | PRN

## 2020-08-24 MED ORDER — LISINOPRIL 40 MG PO TABS
40.0000 mg | ORAL_TABLET | Freq: Every day | ORAL | Status: DC
  Administered 2020-08-24 – 2020-08-26 (×3): 40 mg via ORAL
  Filled 2020-08-24 (×2): qty 1
  Filled 2020-08-24: qty 2

## 2020-08-24 MED ORDER — ONDANSETRON HCL 4 MG/2ML IJ SOLN: 4.0000 mg | Freq: Four times a day (QID) | INTRAMUSCULAR | Status: DC | PRN

## 2020-08-24 MED ORDER — SODIUM CHLORIDE 0.9 % IV SOLN: 10.0000 mL/h | Freq: Once | INTRAVENOUS | Status: DC

## 2020-08-24 MED ORDER — AMLODIPINE BESYLATE 10 MG PO TABS
10.0000 mg | ORAL_TABLET | Freq: Every day | ORAL | Status: DC
  Administered 2020-08-24 – 2020-08-26 (×3): 10 mg via ORAL
  Filled 2020-08-24: qty 1
  Filled 2020-08-24: qty 2
  Filled 2020-08-24: qty 1

## 2020-08-24 MED ORDER — MOMETASONE FURO-FORMOTEROL FUM 200-5 MCG/ACT IN AERO
2.0000 | INHALATION_SPRAY | Freq: Two times a day (BID) | RESPIRATORY_TRACT | Status: DC
  Administered 2020-08-25 – 2020-08-26 (×3): 2 via RESPIRATORY_TRACT
  Filled 2020-08-24: qty 8.8

## 2020-08-24 MED ORDER — ALBUTEROL SULFATE (2.5 MG/3ML) 0.083% IN NEBU: 3.0000 mL | INHALATION_SOLUTION | RESPIRATORY_TRACT | Status: DC | PRN

## 2020-08-24 MED ORDER — ACETAMINOPHEN 650 MG RE SUPP: 650.0000 mg | Freq: Four times a day (QID) | RECTAL | Status: DC | PRN

## 2020-08-24 MED ORDER — ACETAMINOPHEN 325 MG PO TABS
650.0000 mg | ORAL_TABLET | Freq: Four times a day (QID) | ORAL | Status: DC | PRN
  Administered 2020-08-25: 650 mg via ORAL
  Filled 2020-08-24: qty 2

## 2020-08-24 MED ORDER — LACTATED RINGERS IV SOLN: INTRAVENOUS | Status: DC

## 2020-08-24 MED ORDER — BICTEGRAVIR-EMTRICITAB-TENOFOV 50-200-25 MG PO TABS
1.0000 | ORAL_TABLET | Freq: Every day | ORAL | Status: DC
  Administered 2020-08-24 – 2020-08-26 (×3): 1 via ORAL
  Filled 2020-08-24 (×3): qty 1

## 2020-08-24 MED ORDER — ATORVASTATIN CALCIUM 40 MG PO TABS
40.0000 mg | ORAL_TABLET | Freq: Every day | ORAL | Status: DC
  Administered 2020-08-24 – 2020-08-26 (×3): 40 mg via ORAL
  Filled 2020-08-24 (×3): qty 1

## 2020-08-24 MED ORDER — GABAPENTIN 100 MG PO CAPS
100.0000 mg | ORAL_CAPSULE | Freq: Three times a day (TID) | ORAL | Status: DC
  Administered 2020-08-24 – 2020-08-26 (×7): 100 mg via ORAL
  Filled 2020-08-24 (×8): qty 1

## 2020-08-24 MED ORDER — ENSURE ENLIVE PO LIQD
237.0000 mL | Freq: Two times a day (BID) | ORAL | Status: DC
  Administered 2020-08-25 (×2): 237 mL via ORAL

## 2020-08-24 MED ORDER — ISOSORBIDE MONONITRATE ER 60 MG PO TB24
60.0000 mg | ORAL_TABLET | Freq: Every day | ORAL | Status: DC
  Administered 2020-08-24 – 2020-08-26 (×3): 60 mg via ORAL
  Filled 2020-08-24: qty 1
  Filled 2020-08-24: qty 2
  Filled 2020-08-24: qty 1

## 2020-08-24 MED ORDER — PANTOPRAZOLE SODIUM 40 MG IV SOLR
40.0000 mg | Freq: Once | INTRAVENOUS | Status: AC
  Administered 2020-08-24: 40 mg via INTRAVENOUS
  Filled 2020-08-24: qty 40

## 2020-08-24 MED ORDER — DIAZEPAM 2 MG PO TABS
2.0000 mg | ORAL_TABLET | Freq: Two times a day (BID) | ORAL | Status: DC | PRN
  Administered 2020-08-24: 2 mg via ORAL
  Filled 2020-08-24: qty 1

## 2020-08-24 MED ORDER — CARVEDILOL 3.125 MG PO TABS
3.1250 mg | ORAL_TABLET | Freq: Two times a day (BID) | ORAL | Status: DC
  Administered 2020-08-24 – 2020-08-26 (×5): 3.125 mg via ORAL
  Filled 2020-08-24 (×7): qty 1

## 2020-08-24 NOTE — ED Notes (Signed)
Pt reports hx of anemia, reportedly receiving call from PCP for abnormal lab. Pt reports feeling fatigue and weakness x2 weeks. Pt denies having bleeding, denies dark tarry stools, or bright red blood in stool. Pt denies bleeding to gums.

## 2020-08-24 NOTE — ED Provider Notes (Addendum)
[facility] EMERGENCY DEPARTMENT Provider Note   CSN: [id number] Arrival date & time: 08/24/20  0356     History Chief Complaint  Patient presents with  . low hgb    Nicole Mccormick is a 74 y.o. female.  The history is provided by the patient and medical records. No language interpreter was used.     74 year old female significant history of HIV, iron deficiency anemia, has a pacemaker, history of AVM, CAD sent here by PCP for evaluation of low hemoglobin.  Patient reports she was seen by her PCP yesterday for regular checkup.  She received a call later with notification that her hemoglobin is low and she would need to go to the ER for further evaluation.  Patient admits that she has been feeling weak and tired along with lightheadedness ongoing for the past 2 to 3 weeks.  She initially attributed to receiving her COVID-19 booster shot that she received 3 weeks ago.  She does not complain of any fever chills no headache chest pain or shortness of breath no abdominal pain nausea vomiting or diarrhea.  She has not noticed any change in the stool color.  She denies any heavy use of NSAIDs or alcohol.  She has had blood transfusion in the past.  She denies any other abnormal bleeding such as bleeding gums.  She is not on blood thinner medication.  She denies any recent medication changes or change in her diet.  Past Medical History:  Diagnosis Date  . Abnormal Pap smear   . Aortic stenosis   . Arthritis   . Asthma   . AVM (arteriovenous malformation)   . Bradycardia   . Complication of anesthesia    "they have a hard time bringing me back" (11/08/2015)  . Coronary artery disease    a. diffuse CAD of RCA by cath 2016 managed medically.  . Dynamic left ventricular outflow obstruction   . GERD (gastroesophageal reflux disease)    previously on aciphex, discontinued november 2012  because patient asymptomatic, and concern about interference with HIV meds.  May try pepcid in  the future if symptoms return  . Heart murmur   . Hepatitis C antibody test positive   . HIV infection ([place])    diagnosed before 2008  . Hypertension   . Hypertensive heart disease   . Iron deficiency anemia    Ferritin = 2 in november 2012, started on iron supplemenation  . Pacemaker    a. St Jude PPM 01/2018.  . Seizures ([place])   . Varicosities     Patient Active Problem List   Diagnosis Date Noted  . Arthritis 08/23/2020  . Dyspnea   . Shortness of breath   . Symptomatic anemia 06/03/2020  . Chronic arthralgias of knees and hips 07/06/2018  . Sick sinus syndrome ([place]) 02/28/2016  . Hypertensive cardiovascular disease 02/28/2016  . Bilateral carotid bruits 02/12/2016  . LVH (left ventricular hypertrophy) 01/15/2016  . GI bleed 11/07/2015  . Anxiety 07/22/2015  . Seasonal allergies 07/22/2015  . Asthma, chronic 07/22/2015  . Health care maintenance 02/17/2015  . Hepatitis C 08/07/2014  . Back pain 10/28/2012  . LGSIL Pap smear of vagina 01/16/2012  . Anal condylomata 11/18/2011  . CAD (coronary artery disease) 08/07/2011  . Essential hypertension, benign 12/25/2010  . Human immunodeficiency virus (HIV) disease ([place]) 01/23/2007  . TOBACCO ABUSE 01/23/2007  . CATARACT NEC 01/23/2007  . Gastroesophageal reflux disease 01/23/2007    Past Surgical History:  Procedure Laterality Date  .  ABDOMINAL HYSTERECTOMY    . CARDIAC CATHETERIZATION N/A 11/08/2015   Procedure: Left Heart Cath and Coronary Angiography;  Surgeon: Jettie Booze, MD;  Location: [facility];  Service: Cardiovascular;  Laterality: N/A;  . COLONOSCOPY  10/13/11   small adenoma, anal condyloma  . COLONOSCOPY  10/13/2011   Procedure: COLONOSCOPY;  Surgeon: Gatha Mayer, MD;  Location: [facility];  Service: Endoscopy;  Laterality: N/A;  . COLONOSCOPY N/A 09/14/2014   Procedure: COLONOSCOPY;  Surgeon: Arta Silence, MD;  Location: [facility] ENDOSCOPY;  Service: Endoscopy;  Laterality: N/A;  .  COLONOSCOPY WITH PROPOFOL N/A 06/05/2020   Procedure: COLONOSCOPY WITH PROPOFOL;  Surgeon: Arta Silence, MD;  Location: [facility];  Service: Endoscopy;  Laterality: N/A;  . ESOPHAGOGASTRODUODENOSCOPY  10/13/11   small hiatal hernia  . ESOPHAGOGASTRODUODENOSCOPY  10/13/2011   Procedure: ESOPHAGOGASTRODUODENOSCOPY (EGD);  Surgeon: Gatha Mayer, MD;  Location: [facility] ENDOSCOPY;  Service: Endoscopy;  Laterality: N/A;  . ESOPHAGOGASTRODUODENOSCOPY N/A 09/14/2014   Procedure: ESOPHAGOGASTRODUODENOSCOPY (EGD);  Surgeon: Arta Silence, MD;  Location: [facility] ENDOSCOPY;  Service: Endoscopy;  Laterality: N/A;  . ESOPHAGOGASTRODUODENOSCOPY (EGD) WITH PROPOFOL N/A 06/05/2020   Procedure: ESOPHAGOGASTRODUODENOSCOPY (EGD) WITH PROPOFOL;  Surgeon: Arta Silence, MD;  Location: [place];  Service: Endoscopy;  Laterality: N/A;  . GIVENS CAPSULE STUDY N/A 09/14/2014   Procedure: GIVENS CAPSULE STUDY;  Surgeon: Arta Silence, MD;  Location: [facility] ENDOSCOPY;  Service: Endoscopy;  Laterality: N/A;  . PACEMAKER IMPLANT N/A 02/05/2018   Procedure: PACEMAKER IMPLANT;  Surgeon: Thompson Grayer, MD;  Location: [place] CV LAB;  Service: Cardiovascular;  Laterality: N/A;  . POLYPECTOMY  06/05/2020   Procedure: POLYPECTOMY;  Surgeon: Arta Silence, MD;  Location: [facility] ENDOSCOPY;  Service: Endoscopy;;     OB History    Gravida  6   Para  5   Term      Preterm      AB  1   Living  5     SAB  1   TAB      Ectopic      Multiple      Live Births              Family History  Problem Relation Age of Onset  . Diabetes Mother   . Ovarian cancer Sister   . Colon cancer Sister     Social History   Tobacco Use  . Smoking status: Current Some Day Smoker    Packs/day: 1.00    Years: 50.00    Pack years: 50.00    Types: Cigarettes  . Smokeless tobacco: Never Used  . Tobacco comment: Wants to quit. Using both the patch and gum.  Substance Use Topics  . Alcohol use: Yes    Alcohol/week: 10.0  standard drinks    Types: 10 Glasses of wine per week    Comment: Wine.  . Drug use: No    Home Medications Prior to Admission medications   Medication Sig Start Date End Date Taking? Authorizing Provider  acyclovir (ZOVIRAX) 400 MG tablet Take 1 tablet (400 mg total) by mouth daily as needed (for flare ups). 07/06/18   Campbell Riches, MD  albuterol (PROAIR HFA) 108 (90 Base) MCG/ACT inhaler INHALE 2 PUFFS BY MOUTH EVERY 4 HOURS IF NEEDED FOR WHEEZING OR SHORTNESS OF BREATH Patient taking differently: Inhale 2 puffs into the lungs every 4 (four) hours as needed. INHALE 2 PUFFS BY MOUTH EVERY 4 HOURS IF NEEDED FOR WHEEZING OR SHORTNESS OF BREATH 01/06/19  Campbell Riches, MD  albuterol (PROVENTIL) (2.5 MG/3ML) 0.083% nebulizer solution Take 3 mLs (2.5 mg total) by nebulization every 6 (six) hours as needed for wheezing or shortness of breath. 07/06/18   Campbell Riches, MD  amLODipine (NORVASC) 10 MG tablet TAKE 1 TABLET BY MOUTH EVERY DAY Patient taking differently: Take 10 mg by mouth daily.  10/05/18   Campbell Riches, MD  Ascorbic Acid (VITAMIN C) 1000 MG tablet Take 1,000 mg by mouth daily.    [provider]  atorvastatin (LIPITOR) 40 MG tablet take 1 tablet by mouth once daily AT 6 PM Patient taking differently: Take 40 mg by mouth daily. take 1 tablet by mouth once daily AT 6 PM 12/16/17   [place] Callas, NP  azithromycin (ZITHROMAX) 250 MG tablet Take 1 tablet daily for 3 days Patient not taking: Reported on 08/23/2020 06/06/20   Mosetta Anis, MD  benzonatate (TESSALON) 200 MG capsule Take by mouth. 03/27/20   [provider]  BIKTARVY 50-200-25 MG TABS tablet TAKE 1 TABLET BY MOUTH EVERY DAY Patient taking differently: Take 1 tablet by mouth daily.  04/05/19   Campbell Riches, MD  carvedilol (COREG) 3.125 MG tablet TAKE 1 TABLET(3.125 MG) BY MOUTH TWICE DAILY Patient taking differently: Take 3.125 mg by mouth 2 (two) times daily with a meal.  03/23/20    Allred, Jeneen Rinks, MD  Cetirizine HCl (ZYRTEC ALLERGY) 10 MG TBDP Take 1 tablet by mouth daily. 05/20/17   Campbell Riches, MD  cholecalciferol (VITAMIN D) 1000 units tablet Take 1,000 Units by mouth daily.    [provider]  cyclobenzaprine (FLEXERIL) 5 MG tablet TAKE 1 TABLET BY MOUTH TWICE DAILY AS NEEDED FOR MUSCLE SPASM 08/25/18   Campbell Riches, MD  dextromethorphan (DELSYM) 30 MG/5ML liquid Take 30 mg by mouth as needed for cough.    [provider]  diazepam (VALIUM) 2 MG tablet take 1 tablet by mouth every 6 hours if needed for anxiety 07/21/15   Shela Leff, MD  emtricitabine-tenofovir AF (DESCOVY) 200-25 MG tablet Descovy 200 mg-25 mg tablet  TAKE 1 TABLET BY MOUTH DAILY    [provider]  escitalopram (LEXAPRO) 10 MG tablet Take 1 tablet by mouth daily as needed for anxiety. 12/23/18   [provider]  ferrous sulfate 325 (65 FE) MG EC tablet Take 325 mg by mouth daily.    [provider]  Flaxseed, Linseed, (FLAX SEED OIL) 1000 MG CAPS Take 1,000 mg by mouth daily.    [provider]  FLUoxetine (PROZAC) 10 MG capsule fluoxetine 10 mg capsule    [provider]  gabapentin (NEURONTIN) 600 MG tablet TAKE 1 TABLET BY MOUTH THREE TIMES DAILY Patient taking differently: Take 600 mg by mouth 3 (three) times daily.  03/03/19   Michel Bickers, MD  hydrochlorothiazide (HYDRODIURIL) 12.5 MG tablet Take 12.5 mg by mouth daily. 03/30/20   [provider]  HYDROcodone-acetaminophen (NORCO) 5-325 MG tablet Take 1 tablet by mouth every 6 (six) hours as needed for moderate pain. 02/06/18   Patsey Berthold, NP  isosorbide mononitrate (IMDUR) 60 MG 24 hr tablet Take 1 tablet (60 mg total) by mouth daily. 07/06/18   Campbell Riches, MD  lisinopril (ZESTRIL) 40 MG tablet Take 40 mg by mouth daily. 05/11/20   [provider]  meclizine (ANTIVERT) 25 MG tablet Take 25 mg by mouth 2 (two) times daily. 08/03/20   [provider]  methylPREDNISolone (MEDROL)  4 MG tablet methylprednisolone 4 mg tablet    [provider]  Omega-3 Fatty Acids (FISH OIL) 1000 MG CPDR Take 1,000 mg by mouth daily.    [provider]  ondansetron (ZOFRAN-ODT) 4 MG disintegrating tablet Take 1 tablet (4 mg total) by mouth daily as needed for nausea or vomiting. 05/20/17   Campbell Riches, MD  pantoprazole (PROTONIX) 40 MG tablet Take 1 tablet (40 mg total) by mouth daily. 12/16/17   [place] Callas, NP  RA ASPIRIN EC ADULT LOW ST 81 MG EC tablet Take 1 tablet (81 mg total) by mouth daily. 04/12/16   Shela Leff, MD  spironolactone (ALDACTONE) 25 MG tablet TAKE 1 TABLET BY MOUTH EVERY DAY Patient taking differently: Take 25 mg by mouth daily.  10/05/18   Campbell Riches, MD  SYMBICORT 160-4.5 MCG/ACT inhaler Inhale 2 puffs into the lungs 2 (two) times daily. 07/27/20   [provider]  traMADol (ULTRAM) 50 MG tablet tramadol 50 mg tablet    [provider]  triamcinolone cream (KENALOG) 0.1 % Apply 1 application topically 2 (two) times daily. Patient taking differently: Apply 1 application topically 2 (two) times daily as needed (rash, itching).  08/13/17   Tasia Catchings, Amy V, PA-C  vitamin A 10000 UNIT capsule Take 10,000 Units by mouth daily.    [provider]  vitamin B-12 (CYANOCOBALAMIN) 1000 MCG tablet Take 1,000 mcg by mouth daily.    [provider]    Allergies    Penicillins and Avelox [moxifloxacin hcl in nacl]  Review of Systems   Review of Systems  All other systems reviewed and are negative.   Physical Exam Updated Vital Signs BP (!) 116/58 (BP Location: Right Arm)   Pulse 66   Temp 99.3 F (37.4 C) (Oral)   Resp 14   Ht 5\' 2"  (1.575 m)   Wt 55.8 kg   SpO2 100%   BMI 22.50 kg/m   Physical Exam Vitals and nursing note reviewed. Exam conducted with a chaperone present.  Constitutional:      General: She is not in acute distress.    Appearance:  She is well-developed.  HENT:     Head: Atraumatic.  Eyes:     Conjunctiva/sclera: Conjunctivae normal.  Cardiovascular:     Rate and Rhythm: Normal rate and regular rhythm.     Heart sounds: Murmur heard.   Pulmonary:     Effort: Pulmonary effort is normal.     Breath sounds: Normal breath sounds. No wheezing, rhonchi or rales.  Abdominal:     Palpations: Abdomen is soft.     Tenderness: There is no abdominal tenderness.  Genitourinary:    Comments: Andria Frames, RN was available to chaperone.  Normal rectal tone, no obvious mass, no anal fissure, dark black tarry stool noted on exam, fecal occult blood test positive. Musculoskeletal:     Cervical back: Neck supple.  Skin:    Capillary Refill: Capillary refill takes less than 2 seconds.     Findings: No rash.  Neurological:     Mental Status: She is alert and oriented to person, place, and time.  Psychiatric:        Mood and Affect: Mood normal.     ED Results / Procedures / Treatments   Labs (all labs ordered are listed, but only abnormal results are displayed) Labs Reviewed  CBC - Abnormal; Notable for the following components:      Result Value   WBC 3.1 (*)  RBC 1.71 (*)    Hemoglobin 4.7 (*)    HCT 17.9 (*)    MCV 104.7 (*)    MCHC 26.3 (*)    RDW 18.1 (*)    All other components within normal limits  IRON AND TIBC - Abnormal; Notable for the following components:   Iron 11 (*)    TIBC 550 (*)    Saturation Ratios 2 (*)    All other components within normal limits  FERRITIN - Abnormal; Notable for the following components:   Ferritin 6 (*)    All other components within normal limits  RETICULOCYTES - Abnormal; Notable for the following components:   Retic Ct Pct 5.3 (*)    RBC. 1.81 (*)    Immature Retic Fract 29.4 (*)    All other components within normal limits  BASIC METABOLIC PANEL - Abnormal; Notable for the following components:   Glucose, Bld 108 (*)    BUN 24 (*)    Creatinine, Ser 1.05 (*)     Calcium 8.8 (*)    GFR calc non Af Amer 52 (*)    GFR calc Af Amer 60 (*)    All other components within normal limits  POC OCCULT BLOOD, ED - Abnormal; Notable for the following components:   Fecal Occult Bld POSITIVE (*)    All other components within normal limits  SARS CORONAVIRUS 2 BY RT PCR (HOSPITAL ORDER, [facility] LAB)  VITAMIN B12  FOLATE  TYPE AND SCREEN  PREPARE RBC (CROSSMATCH)    EKG EKG Interpretation  Date/Time:  Thursday August 24 2020 04:11:49 EDT Ventricular Rate:  60 PR Interval:  198 QRS Duration: 92 QT Interval:  450 QTC Calculation: 450 R Axis:   4 Text Interpretation: Atrial-paced rhythm Minimal voltage criteria for LVH, may be normal variant ( Cornell product ) Cannot rule out Anterior infarct , age undetermined Abnormal ECG No significant change was found Confirmed by Ezequiel Essex [phone]) on 08/24/2020 5:50:40 AM   Radiology No results found.  Procedures .Critical Care Performed by: Domenic Moras, PA-C Authorized by: Domenic Moras, PA-C   Critical care provider statement:    Critical care time (minutes):  40   Critical care was time spent personally by me on the following activities:  Discussions with consultants, evaluation of patient's response to treatment, examination of patient, ordering and performing treatments and interventions, ordering and review of laboratory studies, ordering and review of radiographic studies, pulse oximetry, re-evaluation of patient's condition, obtaining history from patient or surrogate and review of old charts   (including critical care time)  Medications Ordered in ED Medications  0.9 %  sodium chloride infusion (has no administration in time range)  pantoprazole (PROTONIX) injection 40 mg (40 mg Intravenous Given 08/24/20 0830)    ED Course  I have reviewed the triage vital signs and the nursing notes.  Pertinent labs & imaging results that were available during my care of the  patient were reviewed by me and considered in my medical decision making (see chart for details).    MDM Rules/Calculators/A&P                          BP 139/82   Pulse 60   Temp 99.3 F (37.4 C) (Oral)   Resp (!) 23   Ht 5\' 2"  (1.575 m)   Wt 55.8 kg   SpO2 100%   BMI 22.50 kg/m   Final Clinical Impression(s) /  ED Diagnoses Final diagnoses:  Symptomatic anemia  Gastrointestinal hemorrhage, unspecified gastrointestinal hemorrhage type    Rx / DC Orders ED Discharge Orders    None     7:33 AM Patient with history of AVM as well as history of iron deficiency anemia sent here due to generalized weakness and hemoglobin of 4.7.  She does not have any active pain.  She does have black tarry stool positive of fecal occult blood test.  She has had a recent EGD and colonoscopy in June of this year when she was admitted for same.  At that time, it shows no active bleeding but small colon polyps that was biopsied.  Anemia was presumed to be chronic bleeding from a small bowel AVM seen back in 2016.  7:50 AM GI specialist Dr. Penelope Coop have been made aware of pt via [place].  Since pt has black tarry stool, will initiate protonix for potential UGIB.  Will transfuse blood and will consult for admission.  Screening covid test ordered.  Care discussed with Dr. Wyvonnia Dusky.   8:18 AM Appreciate consultation from [facility] Dr. Lorin Mercy who agrees to see and admit pt for further management of her symptomatic anemia.  Pt is currently receiving blood transfusion.  COVID-19 screening test ordered.     Domenic Moras, PA-C 08/24/20 0834    Domenic Moras, PA-C 08/24/20 [id number]    Ezequiel Essex, MD 08/24/20 [phone]

## 2020-08-24 NOTE — Consult Note (Signed)
Subjective:   HPI  The patient is a 74 year old female with multiple medical problems as described below.  She came to the emergency room today because of progressive weakness and feeling tired over the past 2 or 3 weeks.  She was found to have a hemoglobin of 6.1 and a hematocrit of 22.  The patient has a history of recurring iron deficiency anemia.  In looking over the records I see this dating back to 2012.  She has been heme positive in the past.  She is heme positive again.  She has undergone evaluation for this problem in the past.  She has had several EGDs and colonoscopies per review of records.  Also back in 2015 she had a capsule endoscopy showing multiple small bowel AVMs.  Her last admission to this hospital was in June of this year and she had an EGD on June 28 and also a colonoscopy on June 28 and the results were reviewed.  There was no definite point of bleeding and per Dr. Erlinda Hong last note that I saw from the last procedure was that her anemia should be treated and then if recurring anemia occurred consider repeating capsule endoscopy.  The patient states that she has not noticed active bleeding or black stools however her stools were found to be positive here in the emergency room.  Of note also is that her iron percent saturation is only 2%.    Past Medical History:  Diagnosis Date  . Abnormal Pap smear   . Aortic stenosis   . Arthritis   . Asthma   . AVM (arteriovenous malformation)   . Bradycardia   . Complication of anesthesia    "they have a hard time bringing me back" (11/08/2015)  . Coronary artery disease    a. diffuse CAD of RCA by cath 2016 managed medically.  . Dynamic left ventricular outflow obstruction   . GERD (gastroesophageal reflux disease)    previously on aciphex, discontinued november 2012  because patient asymptomatic, and concern about interference with HIV meds.  May try pepcid in the future if symptoms return  . Heart murmur   . Hepatitis C antibody  test positive   . HIV infection ([place])    diagnosed before 2008  . Hypertension   . Hypertensive heart disease   . Iron deficiency anemia    Ferritin = 2 in november 2012, started on iron supplemenation  . Pacemaker    a. St Jude PPM 01/2018.  . Seizures ([place])   . Varicosities    Past Surgical History:  Procedure Laterality Date  . ABDOMINAL HYSTERECTOMY    . CARDIAC CATHETERIZATION N/A 11/08/2015   Procedure: Left Heart Cath and Coronary Angiography;  Surgeon: Jettie Booze, MD;  Location: [facility];  Service: Cardiovascular;  Laterality: N/A;  . COLONOSCOPY  10/13/11   small adenoma, anal condyloma  . COLONOSCOPY  10/13/2011   Procedure: COLONOSCOPY;  Surgeon: Gatha Mayer, MD;  Location: [place];  Service: Endoscopy;  Laterality: N/A;  . COLONOSCOPY N/A 09/14/2014   Procedure: COLONOSCOPY;  Surgeon: Arta Silence, MD;  Location[facility];  Service: Endoscopy;  Laterality: N/A;  . COLONOSCOPY WITH PROPOFOL N/A 06/05/2020   Procedure: COLONOSCOPY WITH PROPOFOL;  Surgeon: Arta Silence, MD;  Location: [place];  Service: Endoscopy;  Laterality: N/A;  . ESOPHAGOGASTRODUODENOSCOPY  10/13/11   small hiatal hernia  . ESOPHAGOGASTRODUODENOSCOPY  10/13/2011   Procedure: ESOPHAGOGASTRODUODENOSCOPY (EGD);  Surgeon: Gatha Mayer, MD;  Location: [place];  Service: Endoscopy;  Laterality: N/A;  . ESOPHAGOGASTRODUODENOSCOPY N/A 09/14/2014   Procedure: ESOPHAGOGASTRODUODENOSCOPY (EGD);  Surgeon: Arta Silence, MD;  Location[facility];  Service: Endoscopy;  Laterality: N/A;  . ESOPHAGOGASTRODUODENOSCOPY (EGD) WITH PROPOFOL N/A 06/05/2020   Procedure: ESOPHAGOGASTRODUODENOSCOPY (EGD) WITH PROPOFOL;  Surgeon: Arta Silence, MD;  Location: [place];  Service: Endoscopy;  Laterality: N/A;  . GIVENS CAPSULE STUDY N/A 09/14/2014   Procedure: GIVENS CAPSULE STUDY;  Surgeon: Arta Silence, MD;  Location: [facility];  Service: Endoscopy;  Laterality: N/A;  .  PACEMAKER IMPLANT N/A 02/05/2018   Procedure: PACEMAKER IMPLANT;  Surgeon: Thompson Grayer, MD;  Location: [place] [facility];  Service: Cardiovascular;  Laterality: N/A;  . POLYPECTOMY  06/05/2020   Procedure: POLYPECTOMY;  Surgeon: Arta Silence, MD;  Location: [facility] ENDOSCOPY;  Service: Endoscopy;;   Social History   Socioeconomic History  . Marital status: Married    Spouse name: Not on file  . Number of children: Not on file  . Years of education: Not on file  . Highest education level: Not on file  Occupational History  . Occupation: retired  Tobacco Use  . Smoking status: Current Every Day Smoker    Packs/day: 1.00    Years: 60.00    Pack years: 60.00    Types: Cigarettes  . Smokeless tobacco: Never Used  . Tobacco comment: Wants to quit. Using both the patch and gum.  Substance and Sexual Activity  . Alcohol use: Yes    Alcohol/week: 10.0 standard drinks    Types: 10 Glasses of wine per week    Comment: drinking less recently, denies h/o heavy use  . Drug use: No  . Sexual activity: Yes    Partners: Male    Birth control/protection: Surgical    Comment: pt. given condoms 08/23/20  Other Topics Concern  . Not on file  Social History Narrative  . Not on file   Social Determinants of Health   Financial Resource Strain:   . Difficulty of Paying Living Expenses: Not on file  Food Insecurity:   . Worried About Charity fundraiser in the Last Year: Not on file  . Ran Out of Food in the Last Year: Not on file  Transportation Needs:   . Lack of Transportation (Medical): Not on file  . Lack of Transportation (Non-Medical): Not on file  Physical Activity:   . Days of Exercise per Week: Not on file  . Minutes of Exercise per Session: Not on file  Stress:   . Feeling of Stress : Not on file  Social Connections:   . Frequency of Communication with Friends and Family: Not on file  . Frequency of Social Gatherings with Friends and Family: Not on file  . Attends Religious  Services: Not on file  . Active Member of Clubs or Organizations: Not on file  . Attends Archivist Meetings: Not on file  . Marital Status: Not on file  Intimate Partner Violence:   . Fear of Current or Ex-Partner: Not on file  . Emotionally Abused: Not on file  . Physically Abused: Not on file  . Sexually Abused: Not on file   family history includes Colon cancer in her sister; Diabetes in her mother; Ovarian cancer in her sister.  Current Facility-Administered Medications:  .  0.9 %  sodium chloride infusion, 10 mL/hr, Intravenous, Once, Karmen Bongo, MD .  acetaminophen (TYLENOL) tablet 650 mg, 650 mg, Oral, Q6H PRN **OR** acetaminophen (TYLENOL) suppository 650 mg, 650 mg, Rectal,  Q6H PRN, Karmen Bongo, MD .  albuterol (PROVENTIL) (2.5 MG/3ML) 0.083% nebulizer solution 3 mL, 3 mL, Inhalation, Q4H PRN, Karmen Bongo, MD .  amLODipine (NORVASC) tablet 10 mg, 10 mg, Oral, Daily, Karmen Bongo, MD, 10 mg at 08/24/20 1229 .  atorvastatin (LIPITOR) tablet 40 mg, 40 mg, Oral, Daily, Karmen Bongo, MD, 40 mg at 08/24/20 1233 .  bictegravir-emtricitabine-tenofovir AF (BIKTARVY) 50-200-25 MG per tablet 1 tablet, 1 tablet, Oral, Daily, Karmen Bongo, MD, 1 tablet at 08/24/20 1230 .  carvedilol (COREG) tablet 3.125 mg, 3.125 mg, Oral, BID WC, Karmen Bongo, MD .  diazepam (VALIUM) tablet 2 mg, 2 mg, Oral, Q12H PRN, Karmen Bongo, MD, 2 mg at 08/24/20 1323 .  gabapentin (NEURONTIN) capsule 100 mg, 100 mg, Oral, TID, Karmen Bongo, MD, 100 mg at 08/24/20 1230 .  isosorbide mononitrate (IMDUR) 24 hr tablet 60 mg, 60 mg, Oral, Daily, Karmen Bongo, MD, 60 mg at 08/24/20 1229 .  lactated ringers infusion, , Intravenous, Continuous, Karmen Bongo, MD, Last Rate: 100 mL/hr at 08/24/20 1010, New Bag at 08/24/20 1010 .  lisinopril (ZESTRIL) tablet 40 mg, 40 mg, Oral, Daily, Karmen Bongo, MD, 40 mg at 08/24/20 1230 .  mometasone-formoterol (DULERA) 200-5 MCG/ACT inhaler 2  puff, 2 puff, Inhalation, BID, Karmen Bongo, MD .  ondansetron [facility]) tablet 4 mg, 4 mg, Oral, Q6H PRN **OR** ondansetron (ZOFRAN) injection 4 mg, 4 mg, Intravenous, Q6H PRN, Karmen Bongo, MD  Current Outpatient Medications:  .  acyclovir (ZOVIRAX) 400 MG tablet, Take 1 tablet (400 mg total) by mouth daily as needed (for flare ups)., Disp: 30 tablet, Rfl: 3 .  albuterol (PROAIR HFA) 108 (90 Base) MCG/ACT inhaler, INHALE 2 PUFFS BY MOUTH EVERY 4 HOURS IF NEEDED FOR WHEEZING OR SHORTNESS OF BREATH (Patient taking differently: Inhale 2 puffs into the lungs every 4 (four) hours as needed for wheezing or shortness of breath. ), Disp: 8.5 g, Rfl: 0 .  amLODipine (NORVASC) 10 MG tablet, TAKE 1 TABLET BY MOUTH EVERY DAY (Patient taking differently: Take 10 mg by mouth daily. ), Disp: 90 tablet, Rfl: 0 .  Ascorbic Acid (VITAMIN C) 1000 MG tablet, Take 1,000 mg by mouth daily., Disp: , Rfl:  .  aspirin 81 MG EC tablet, Take 81 mg by mouth daily., Disp: , Rfl:  .  atorvastatin (LIPITOR) 40 MG tablet, take 1 tablet by mouth once daily AT 6 PM (Patient taking differently: Take 40 mg by mouth daily. take 1 tablet by mouth once daily AT 6 PM), Disp: 90 tablet, Rfl: 0 .  BIKTARVY 50-200-25 MG TABS tablet, TAKE 1 TABLET BY MOUTH EVERY DAY (Patient taking differently: Take 1 tablet by mouth daily. ), Disp: 90 tablet, Rfl: 3 .  carvedilol (COREG) 3.125 MG tablet, TAKE 1 TABLET(3.125 MG) BY MOUTH TWICE DAILY (Patient taking differently: Take 3.125 mg by mouth 2 (two) times daily with a meal. ), Disp: 180 tablet, Rfl: 3 .  Cetirizine HCl (ZYRTEC ALLERGY) 10 MG TBDP, Take 1 tablet by mouth daily. (Patient taking differently: Take 10 mg by mouth as needed (allergies). ), Disp: 90 tablet, Rfl: 3 .  cholecalciferol (VITAMIN D) 1000 units tablet, Take 1,000 Units by mouth daily., Disp: , Rfl:  .  dextromethorphan (DELSYM) 30 MG/5ML liquid, Take 30 mg by mouth as needed for cough., Disp: , Rfl:  .  diazepam (VALIUM) 2  MG tablet, take 1 tablet by mouth every 6 hours if needed for anxiety (Patient taking differently: Take 2 mg by mouth as  needed for anxiety. ), Disp: 30 tablet, Rfl: 0 .  escitalopram (LEXAPRO) 10 MG tablet, Take 10 mg by mouth daily as needed (anxiety). , Disp: , Rfl:  .  ferrous sulfate 325 (65 FE) MG EC tablet, Take 325 mg by mouth daily., Disp: , Rfl:  .  Flaxseed, Linseed, (FLAX SEED OIL) 1000 MG CAPS, Take 1,000 mg by mouth daily., Disp: , Rfl:  .  gabapentin (NEURONTIN) 100 MG capsule, Take 100 mg by mouth 3 (three) times daily., Disp: , Rfl:  .  hydrochlorothiazide (HYDRODIURIL) 25 MG tablet, Take 25 mg by mouth daily. , Disp: , Rfl:  .  isosorbide mononitrate (IMDUR) 60 MG 24 hr tablet, Take 1 tablet (60 mg total) by mouth daily., Disp: 90 tablet, Rfl: 1 .  lisinopril (ZESTRIL) 40 MG tablet, Take 40 mg by mouth daily., Disp: , Rfl:  .  meclizine (ANTIVERT) 25 MG tablet, Take 25 mg by mouth as needed for dizziness. , Disp: , Rfl:  .  Omega-3 Fatty Acids (FISH OIL) 1000 MG CPDR, Take 1,000 mg by mouth daily., Disp: , Rfl:  .  pantoprazole (PROTONIX) 40 MG tablet, Take 1 tablet (40 mg total) by mouth daily., Disp: 90 tablet, Rfl: 0 .  spironolactone (ALDACTONE) 25 MG tablet, TAKE 1 TABLET BY MOUTH EVERY DAY (Patient taking differently: Take 25 mg by mouth daily. ), Disp: 30 tablet, Rfl: 0 .  SYMBICORT 160-4.5 MCG/ACT inhaler, Inhale 2 puffs into the lungs 2 (two) times daily., Disp: , Rfl:  .  triamcinolone cream (KENALOG) 0.1 %, Apply 1 application topically 2 (two) times daily. (Patient taking differently: Apply 1 application topically 2 (two) times daily as needed (rash, itching). ), Disp: 30 g, Rfl: 0 .  vitamin A 10000 UNIT capsule, Take 10,000 Units by mouth daily., Disp: , Rfl:  .  vitamin B-12 (CYANOCOBALAMIN) 1000 MCG tablet, Take 1,000 mcg by mouth daily., Disp: , Rfl:  Allergies  Allergen Reactions  . Penicillins     REACTION: GOES INTO SHOCK & THEN PASSES OU Has patient had a PCN  reaction causing immediate rash, facial/tongue/throat swelling, SOB or lightheadedness with hypotension: NO Has patient had a PCN reaction causing severe rash involving mucus membranes or skin necrosis: NO Has patient had a PCN reaction that required hospitalization NO Has patient had a PCN reaction occurring within the last 10 years: NO If all of the above answers are "NO", then may proceed with Cephalosporin use.   . Avelox [Moxifloxacin Hcl In Nacl] Itching and Rash     Objective:     BP 135/72   Pulse 60   Temp 98.3 F (36.8 C)   Resp 14   Ht 5\' 2"  (1.575 m)   Wt 55.8 kg   SpO2 99%   BMI 22.50 kg/m   Alert and oriented  No acute distress  Heart regular rhythm no murmurs  Lungs clear  Abdomen: Bowel sounds present, soft, nontender, no hepatosplenomegaly  Laboratory No components found for: D1    Assessment:     Recurring iron deficiency anemia  Heme positive stool  Chronic slow GI blood loss from AVMs previously seen on capsule endoscopy.  Most recent EGD and colonoscopy in late June of this year, which were unrevealing for a source of active bleeding.      Plan:     Transfuse blood as is currently being done.  The patient states she is starving and wants something to eat and therefore since we are not going to  do any procedures today I will put her on a regular diet.  I think the underlying problem here is bleeding slowly and chronically from small bowel AVMs.  We can discuss updating a capsule endoscopy either inpatient or outpatient.  This is a difficult problem which seems to have been going on at least 9 years now from what I speculate in reviewing her chart.

## 2020-08-24 NOTE — H&P (Addendum)
History and Physical    Nicole Mccormick BLT:[id number] DOB: 1946-11-21 DOA: 08/24/2020  PCP: Sonia Side., FNP Consultants:  Johnnye Sima - ID; Outlaw - GI; Allred - cardiology Patient coming from:  Home - lives with husband and son; Donald Prose: Husband, [phone]  Chief Complaint: "Low blood"  HPI: Nicole Mccormick is a 74 y.o. female with medical history significant of seizures; pacemaker placement; HTN; HIV; AVM; and nonobstructive CAD presenting with "low blood."  She thinks this has been going on for about 2 weeks.  She has been feeling dizzy, no energy, sleeping all day, not eating.  +nausea, no vomiting.  Normal BMs.  Has not seen blood in her stools.  Mild SOB.  Unintentional weight loss of 10 pounds.    ED Course: Hgb 4.7.  Saw PCP yesterday, Hgb 5 and sent here.  Black, tarry stool.  Given Protonix.  Heme positive.  EGD/colonoscopy in June, thought to be AVM.  Dr. Penelope Coop consulted.  Transfusing 3 units.  Review of Systems: As per HPI; otherwise review of systems reviewed and negative.   Ambulatory Status:  Ambulates without assistance normally, but using a walker recently  COVID Vaccine Status:  Complete with booster  Past Medical History:  Diagnosis Date  . Abnormal Pap smear   . Aortic stenosis   . Arthritis   . Asthma   . AVM (arteriovenous malformation)   . Bradycardia   . Complication of anesthesia    "they have a hard time bringing me back" (11/08/2015)  . Coronary artery disease    a. diffuse CAD of RCA by cath 2016 managed medically.  . Dynamic left ventricular outflow obstruction   . GERD (gastroesophageal reflux disease)    previously on aciphex, discontinued november 2012  because patient asymptomatic, and concern about interference with HIV meds.  May try pepcid in the future if symptoms return  . Heart murmur   . Hepatitis C antibody test positive   . HIV infection ([place])    diagnosed before 2008  . Hypertension   . Hypertensive heart disease   . Iron deficiency  anemia    Ferritin = 2 in november 2012, started on iron supplemenation  . Pacemaker    a. St Jude PPM 01/2018.  . Seizures ([place])   . Varicosities     Past Surgical History:  Procedure Laterality Date  . ABDOMINAL HYSTERECTOMY    . CARDIAC CATHETERIZATION N/A 11/08/2015   Procedure: Left Heart Cath and Coronary Angiography;  Surgeon: Jettie Booze, MD;  Location: [place] [facility];  Service: Cardiovascular;  Laterality: N/A;  . COLONOSCOPY  10/13/11   small adenoma, anal condyloma  . COLONOSCOPY  10/13/2011   Procedure: COLONOSCOPY;  Surgeon: Gatha Mayer, MD;  Location: [place];  Service: Endoscopy;  Laterality: N/A;  . COLONOSCOPY N/A 09/14/2014   Procedure: COLONOSCOPY;  Surgeon: Arta Silence, MD;  Location: [facility] ENDOSCOPY;  Service: Endoscopy;  Laterality: N/A;  . COLONOSCOPY WITH PROPOFOL N/A 06/05/2020   Procedure: COLONOSCOPY WITH PROPOFOL;  Surgeon: Arta Silence, MD;  Location: [place];  Service: Endoscopy;  Laterality: N/A;  . ESOPHAGOGASTRODUODENOSCOPY  10/13/11   small hiatal hernia  . ESOPHAGOGASTRODUODENOSCOPY  10/13/2011   Procedure: ESOPHAGOGASTRODUODENOSCOPY (EGD);  Surgeon: Gatha Mayer, MD;  Location: [facility] ENDOSCOPY;  Service: Endoscopy;  Laterality: N/A;  . ESOPHAGOGASTRODUODENOSCOPY N/A 09/14/2014   Procedure: ESOPHAGOGASTRODUODENOSCOPY (EGD);  Surgeon: Arta Silence, MD;  Location: [facility] ENDOSCOPY;  Service: Endoscopy;  Laterality: N/A;  . ESOPHAGOGASTRODUODENOSCOPY (EGD) WITH PROPOFOL N/A 06/05/2020  Procedure: ESOPHAGOGASTRODUODENOSCOPY (EGD) WITH PROPOFOL;  Surgeon: Arta Silence, MD;  Location: [facility] ENDOSCOPY;  Service: Endoscopy;  Laterality: N/A;  . GIVENS CAPSULE STUDY N/A 09/14/2014   Procedure: GIVENS CAPSULE STUDY;  Surgeon: Arta Silence, MD;  Location: [facility] ENDOSCOPY;  Service: Endoscopy;  Laterality: N/A;  . PACEMAKER IMPLANT N/A 02/05/2018   Procedure: PACEMAKER IMPLANT;  Surgeon: Thompson Grayer, MD;  Location: [place] [facility];  Service:  Cardiovascular;  Laterality: N/A;  . POLYPECTOMY  06/05/2020   Procedure: POLYPECTOMY;  Surgeon: Arta Silence, MD;  Location: [facility] ENDOSCOPY;  Service: Endoscopy;;    Social History   Socioeconomic History  . Marital status: Married    Spouse name: Not on file  . Number of children: Not on file  . Years of education: Not on file  . Highest education level: Not on file  Occupational History  . Occupation: retired  Tobacco Use  . Smoking status: Current Every Day Smoker    Packs/day: 1.00    Years: 60.00    Pack years: 60.00    Types: Cigarettes  . Smokeless tobacco: Never Used  . Tobacco comment: Wants to quit. Using both the patch and gum.  Substance and Sexual Activity  . Alcohol use: Yes    Alcohol/week: 10.0 standard drinks    Types: 10 Glasses of wine per week    Comment: drinking less recently, denies h/o heavy use  . Drug use: No  . Sexual activity: Yes    Partners: Male    Birth control/protection: Surgical    Comment: pt. given condoms 08/23/20  Other Topics Concern  . Not on file  Social History Narrative  . Not on file   Social Determinants of Health   Financial Resource Strain:   . Difficulty of Paying Living Expenses: Not on file  Food Insecurity:   . Worried About Charity fundraiser in the Last Year: Not on file  . Ran Out of Food in the Last Year: Not on file  Transportation Needs:   . Lack of Transportation (Medical): Not on file  . Lack of Transportation (Non-Medical): Not on file  Physical Activity:   . Days of Exercise per Week: Not on file  . Minutes of Exercise per Session: Not on file  Stress:   . Feeling of Stress : Not on file  Social Connections:   . Frequency of Communication with Friends and Family: Not on file  . Frequency of Social Gatherings with Friends and Family: Not on file  . Attends Religious Services: Not on file  . Active Member of Clubs or Organizations: Not on file  . Attends Archivist Meetings: Not on file    . Marital Status: Not on file  Intimate Partner Violence:   . Fear of Current or Ex-Partner: Not on file  . Emotionally Abused: Not on file  . Physically Abused: Not on file  . Sexually Abused: Not on file    Allergies  Allergen Reactions  . Penicillins     REACTION: GOES INTO SHOCK & THEN PASSES OU Has patient had a PCN reaction causing immediate rash, facial/tongue/throat swelling, SOB or lightheadedness with hypotension: NO Has patient had a PCN reaction causing severe rash involving mucus membranes or skin necrosis: NO Has patient had a PCN reaction that required hospitalization NO Has patient had a PCN reaction occurring within the last 10 years: NO If all of the above answers are "NO", then may proceed with Cephalosporin use.   . Avelox [Moxifloxacin Hcl  In Nacl] Itching and Rash    Family History  Problem Relation Age of Onset  . Diabetes Mother   . Ovarian cancer Sister   . Colon cancer Sister     Prior to Admission medications   Medication Sig Start Date End Date Taking? Authorizing Provider  acyclovir (ZOVIRAX) 400 MG tablet Take 1 tablet (400 mg total) by mouth daily as needed (for flare ups). 07/06/18   Campbell Riches, MD  albuterol (PROAIR HFA) 108 (90 Base) MCG/ACT inhaler INHALE 2 PUFFS BY MOUTH EVERY 4 HOURS IF NEEDED FOR WHEEZING OR SHORTNESS OF BREATH Patient taking differently: Inhale 2 puffs into the lungs every 4 (four) hours as needed. INHALE 2 PUFFS BY MOUTH EVERY 4 HOURS IF NEEDED FOR WHEEZING OR SHORTNESS OF BREATH 01/06/19   Campbell Riches, MD  albuterol (PROVENTIL) (2.5 MG/3ML) 0.083% nebulizer solution Take 3 mLs (2.5 mg total) by nebulization every 6 (six) hours as needed for wheezing or shortness of breath. 07/06/18   Campbell Riches, MD  amLODipine (NORVASC) 10 MG tablet TAKE 1 TABLET BY MOUTH EVERY DAY Patient taking differently: Take 10 mg by mouth daily.  10/05/18   Campbell Riches, MD  Ascorbic Acid (VITAMIN C) 1000 MG tablet Take  1,000 mg by mouth daily.    [provider]  atorvastatin (LIPITOR) 40 MG tablet take 1 tablet by mouth once daily AT 6 PM Patient taking differently: Take 40 mg by mouth daily. take 1 tablet by mouth once daily AT 6 PM 12/16/17   Redford Callas, NP  azithromycin (ZITHROMAX) 250 MG tablet Take 1 tablet daily for 3 days Patient not taking: Reported on 08/23/2020 06/06/20   Mosetta Anis, MD  benzonatate (TESSALON) 200 MG capsule Take by mouth. 03/27/20   [provider]  BIKTARVY 50-200-25 MG TABS tablet TAKE 1 TABLET BY MOUTH EVERY DAY Patient taking differently: Take 1 tablet by mouth daily.  04/05/19   Campbell Riches, MD  carvedilol (COREG) 3.125 MG tablet TAKE 1 TABLET(3.125 MG) BY MOUTH TWICE DAILY Patient taking differently: Take 3.125 mg by mouth 2 (two) times daily with a meal.  03/23/20   Allred, Jeneen Rinks, MD  Cetirizine HCl (ZYRTEC ALLERGY) 10 MG TBDP Take 1 tablet by mouth daily. 05/20/17   Campbell Riches, MD  cholecalciferol (VITAMIN D) 1000 units tablet Take 1,000 Units by mouth daily.    [provider]  cyclobenzaprine (FLEXERIL) 5 MG tablet TAKE 1 TABLET BY MOUTH TWICE DAILY AS NEEDED FOR MUSCLE SPASM 08/25/18   Campbell Riches, MD  dextromethorphan (DELSYM) 30 MG/5ML liquid Take 30 mg by mouth as needed for cough.    [provider]  diazepam (VALIUM) 2 MG tablet take 1 tablet by mouth every 6 hours if needed for anxiety 07/21/15   Shela Leff, MD  emtricitabine-tenofovir AF (DESCOVY) 200-25 MG tablet Descovy 200 mg-25 mg tablet  TAKE 1 TABLET BY MOUTH DAILY    [provider]  escitalopram (LEXAPRO) 10 MG tablet Take 1 tablet by mouth daily as needed for anxiety. 12/23/18   [provider]  ferrous sulfate 325 (65 FE) MG EC tablet Take 325 mg by mouth daily.    [provider]  Flaxseed, Linseed, (FLAX SEED OIL) 1000 MG CAPS Take 1,000 mg by mouth daily.    [provider]  FLUoxetine (PROZAC) 10 MG  capsule fluoxetine 10 mg capsule    [provider]  gabapentin (NEURONTIN) 600 MG tablet TAKE 1  TABLET BY MOUTH THREE TIMES DAILY Patient taking differently: Take 600 mg by mouth 3 (three) times daily.  03/03/19   Michel Bickers, MD  hydrochlorothiazide (HYDRODIURIL) 12.5 MG tablet Take 12.5 mg by mouth daily. 03/30/20   [provider]  HYDROcodone-acetaminophen (NORCO) 5-325 MG tablet Take 1 tablet by mouth every 6 (six) hours as needed for moderate pain. 02/06/18   Patsey Berthold, NP  isosorbide mononitrate (IMDUR) 60 MG 24 hr tablet Take 1 tablet (60 mg total) by mouth daily. 07/06/18   Campbell Riches, MD  lisinopril (ZESTRIL) 40 MG tablet Take 40 mg by mouth daily. 05/11/20   [provider]  meclizine (ANTIVERT) 25 MG tablet Take 25 mg by mouth 2 (two) times daily. 08/03/20   [provider]  methylPREDNISolone (MEDROL) 4 MG tablet methylprednisolone 4 mg tablet    [provider]  Omega-3 Fatty Acids (FISH OIL) 1000 MG CPDR Take 1,000 mg by mouth daily.    [provider]  ondansetron (ZOFRAN-ODT) 4 MG disintegrating tablet Take 1 tablet (4 mg total) by mouth daily as needed for nausea or vomiting. 05/20/17   Campbell Riches, MD  pantoprazole (PROTONIX) 40 MG tablet Take 1 tablet (40 mg total) by mouth daily. 12/16/17   La Vernia Callas, NP  RA ASPIRIN EC ADULT LOW ST 81 MG EC tablet Take 1 tablet (81 mg total) by mouth daily. 04/12/16   Shela Leff, MD  spironolactone (ALDACTONE) 25 MG tablet TAKE 1 TABLET BY MOUTH EVERY DAY Patient taking differently: Take 25 mg by mouth daily.  10/05/18   Campbell Riches, MD  SYMBICORT 160-4.5 MCG/ACT inhaler Inhale 2 puffs into the lungs 2 (two) times daily. 07/27/20   [provider]  traMADol (ULTRAM) 50 MG tablet tramadol 50 mg tablet    [provider]  triamcinolone cream (KENALOG) 0.1 % Apply 1 application topically 2 (two) times daily. Patient taking differently: Apply  1 application topically 2 (two) times daily as needed (rash, itching).  08/13/17   Tasia Catchings, Amy V, PA-C  vitamin A 10000 UNIT capsule Take 10,000 Units by mouth daily.    [provider]  vitamin B-12 (CYANOCOBALAMIN) 1000 MCG tablet Take 1,000 mcg by mouth daily.    [provider]    Physical Exam: Vitals:   08/24/20 0915 08/24/20 0930 08/24/20 0958 08/24/20 1045  BP: (!) 149/66 (!) 168/60 (!) 149/62 (!) 129/38  Pulse: 64 63 61 (!) 59  Resp: 18 15 11 11   Temp:      TempSrc:      SpO2: 96% 100% 100% 100%  Weight:      Height:         . General:  Appears calm and comfortable and is NAD . Eyes:  PERRL, EOMI, normal lids, iris . ENT:  grossly normal hearing, lips & tongue, mmm; appropriate dentition . Neck:  no LAD, masses or thyromegaly; no carotid bruits . Cardiovascular:  RRR, no r/g, 6-9/6 systolic murmur. No LE edema.  Marland Kitchen Respiratory:   CTA bilaterally with no wheezes/rales/rhonchi.  Normal respiratory effort. . Abdomen:  soft, NT, ND, NABS . Back:   normal alignment, no CVAT . Skin:  no rash or induration seen on limited exam . Musculoskeletal:  grossly normal tone BUE/BLE, good ROM, no bony abnormality . Lower extremity:  No LE edema.  Limited foot exam with no ulcerations.  2+ distal pulses. Marland Kitchen Psychiatric:  grossly normal mood and affect, speech fluent and appropriate, AOx3 .  Neurologic:  CN 2-12 grossly intact, moves all extremities in coordinated fashion, sensation intact    Radiological Exams on Admission: No results found.  EKG: Independently reviewed.  Atrial-paced rhythm with rate 60; nonspecific ST changes with no evidence of acute ischemia; NSCSLT   Labs on Admission: I have personally reviewed the available labs and imaging studies at the time of the admission.  Pertinent labs:   Glucose 108 BUN 24/Creatinine 1.05/GFR 60 WBC 3.1 Hgb 4.7; 5.4 on 9/15 Heme positive COVID pending  Assessment/Plan Principal Problem:   Acute blood loss  anemia Active Problems:   Human immunodeficiency virus (HIV) disease (HCC)   TOBACCO ABUSE   Essential hypertension, benign   CAD (coronary artery disease)   GI bleed   COPD (chronic obstructive pulmonary disease) (HCC)   Symptomatic anemia, thought to be due to GI bleeding -Patient with prior admission for symptomatic from GI bleed in 05/2020 -During that admission, EGD showed a small non-bleeding angiodysplastic lesion in the duodenum; colonoscopy with internal and external hemorrhoids, 2 x 4-5 mm polyps in the sigmoid and ascending colon that were removed -Prior capsule endoscopy in 2015 showed small bowel AVMs and this was thought to be the source of her bleeding at that time -Now with several weeks of weakness, fatigue, hypersomnolence, anorexia -Hgb 5.4 yesterday and 4.7 today; it was 8.4 on 6/28 -MCV is normal to high -With iron low and ferritin; if <15, diagnostic of iron deficiency -Heme testing was positive in the ER -Likely slow blood loss given the severity of her anemia and her relatively mild symptoms -Will admit to med surg bed; given her very low starting Hgb, she appears to need ongoing evaluation and treatment. -Transfuse 3 units PRBC to start and recheck Hgb afterwards.  Given her very low starting Hgb, she is likely to require more units.   -GI has been consulted  HIV -Continue Biktarvy -Followed by Dr. Johnnye Sima  HTN -Continue Norvasc, Coreg -Hold HCTZ, Lisinopril, Aldactone in the setting of presumed chronic blood loss anemia; consider resumption tomorrow if BP will tolerate  Seizure d/o -Uncertain when last seizure was, remote  CAD -Nonobstructive, med management -Continue Imdur  Tobacco dependence with COPD -Encourage cessation.   -This was discussed with the patient and should be reviewed on an ongoing basis.   -Patch ordered  -Continue Albuterol and Symbicort ([place] formulary substitution)  HLD -Continue Lipitor  Mood disorder -Hold Lexapro -  prn use of this medication is not generally effective -Continue Valium prn     Note: This patient has been tested and is negative for the novel coronavirus COVID-19.  DVT prophylaxis:  SCDs Code Status:  Full - confirmed with patient Family Communication: None present Disposition Plan:  The patient is from: home  Anticipated d/c is to: home without [facility] services   Anticipated d/c date will depend on clinical response to treatment, likely several days  Patient is currently: acutely ill Consults called: GI  Admission status:  Admit - It is my clinical opinion that admission to [place] is reasonable and necessary because of the expectation that this patient will require hospital care that crosses at least 2 midnights to treat this condition based on the medical complexity of the problems presented.  Given the aforementioned information, the predictability of an adverse outcome is felt to be significant.    Karmen Bongo MD [facility]   How to contact the [facility] Attending or Consulting provider [place] or covering provider during after hours [place], for  this patient?  1. Check the care team in [facility] and look for a) attending/consulting [facility] provider listed and b) the [facility] team listed 2. Log into [email or web address] and use [place]'s universal password to access. If you do not have the password, please contact the hospital operator. 3. Locate the [facility] provider you are looking for under [facility] and page to a number that you can be directly reached. 4. If you still have difficulty reaching the provider, please page the [facility] (Director on Call) for the Hospitalists listed on amion for assistance.   08/24/2020, 11:09 AM

## 2020-08-24 NOTE — ED Notes (Signed)
Date and time results received: 08/24/20 0552 (use smartphrase ".now" to insert current time)  Test: Hbg Critical Value: 4.7  Name of Provider Notified: [place] Orders Received? Or Actions Taken?:

## 2020-08-24 NOTE — ED Triage Notes (Signed)
Pt told by pcp to come to ED 'or [facility]' due to low hemaglobin. Blood drawn today at Dr Algis Downs office. Pt is alert and oriented. States she has been feeling tired for weeks. No cp or sob. Appears in nad. States she has not seen any blood in her stool.

## 2020-08-25 ENCOUNTER — Inpatient Hospital Stay (HOSPITAL_COMMUNITY): Payer: 59

## 2020-08-25 ENCOUNTER — Other Ambulatory Visit: Payer: Medicaid Other

## 2020-08-25 LAB — TYPE AND SCREEN
ABO/RH(D): O POS
Antibody Screen: NEGATIVE
Unit division: 0
Unit division: 0
Unit division: 0

## 2020-08-25 LAB — BASIC METABOLIC PANEL
Anion gap: 6 (ref 5–15)
BUN: 15 mg/dL (ref 8–23)
CO2: 24 mmol/L (ref 22–32)
Calcium: 8.4 mg/dL — ABNORMAL LOW (ref 8.9–10.3)
Chloride: 110 mmol/L (ref 98–111)
Creatinine, Ser: 0.76 mg/dL (ref 0.44–1.00)
GFR calc Af Amer: >60 mL/min (ref >60)
GFR calc non Af Amer: >60 mL/min (ref >60)
Glucose, Bld: 126 mg/dL — ABNORMAL HIGH (ref 70–99)
Potassium: 3.7 mmol/L (ref 3.5–5.1)
Sodium: 140 mmol/L (ref 135–145)

## 2020-08-25 LAB — HEMOGLOBIN AND HEMATOCRIT, BLOOD
HCT: 28 % — ABNORMAL LOW (ref 36.0–46.0)
HCT: 28.6 % — ABNORMAL LOW (ref 36.0–46.0)
HCT: 29.2 % — ABNORMAL LOW (ref 36.0–46.0)
Hemoglobin: 8.5 g/dL — ABNORMAL LOW (ref 12.0–15.0)
Hemoglobin: 8.7 g/dL — ABNORMAL LOW (ref 12.0–15.0)
Hemoglobin: 8.8 g/dL — ABNORMAL LOW (ref 12.0–15.0)

## 2020-08-25 LAB — CBC
HCT: 28.7 % — ABNORMAL LOW (ref 36.0–46.0)
Hemoglobin: 8.7 g/dL — ABNORMAL LOW (ref 12.0–15.0)
MCH: 27.2 pg (ref 26.0–34.0)
MCHC: 30.3 g/dL (ref 30.0–36.0)
MCV: 89.7 fL (ref 80.0–100.0)
Platelets: 196 10*3/uL (ref 150–400)
RBC: 3.2 MIL/uL — ABNORMAL LOW (ref 3.87–5.11)
RDW: 20.2 % — ABNORMAL HIGH (ref 11.5–15.5)
WBC: 4.4 10*3/uL (ref 4.0–10.5)
nRBC: 0 % (ref 0.0–0.2)

## 2020-08-25 LAB — BPAM RBC
Blood Product Expiration Date: 202109282359
Blood Product Expiration Date: 202109282359
Blood Product Expiration Date: 202109292359
ISSUE DATE / TIME: 202109160806
ISSUE DATE / TIME: 202109161342
ISSUE DATE / TIME: 202109162157
Unit Type and Rh: 5100
Unit Type and Rh: 5100
Unit Type and Rh: 5100

## 2020-08-25 MED ORDER — SODIUM CHLORIDE 0.9 % IV SOLN
510.0000 mg | Freq: Once | INTRAVENOUS | Status: AC
  Administered 2020-08-25: 510 mg via INTRAVENOUS
  Filled 2020-08-25: qty 17

## 2020-08-25 NOTE — Progress Notes (Signed)
[facility] Progress Note  Subjective:  The patient is feeling much better today since she has received blood transfusions and has eaten.  She still denies seeing any blood in the stool or even melena.  She denies abdominal pain Objective: Vital signs in last 24 hours: Temp:  [98 F (36.7 C)-98.5 F (36.9 C)] 98 F (36.7 C) (09/17 0448) Pulse Rate:  [59-70] 60 (09/17 0448) Resp:  [11-20] 17 (09/17 0448) BP: (88-156)/(38-98) 146/67 (09/17 0448) SpO2:  [93 %-100 %] 99 % (09/17 0448) Weight change:    PE:  No distress  Lab Results: Results for orders placed or performed during the hospital encounter of 08/24/20 (from the past 24 hour(s))  CBC     Status: Abnormal   Collection Time: 08/24/20 12:54 PM  Result Value Ref Range   WBC 3.8 (L) 4.0 - 10.5 K/uL   RBC 2.33 (L) 3.87 - 5.11 MIL/uL   Hemoglobin 6.1 (LL) 12.0 - 15.0 g/dL   HCT 22.0 (L) 36 - 46 %   MCV 94.4 80.0 - 100.0 fL   MCH 26.2 26.0 - 34.0 pg   MCHC 27.7 (L) 30.0 - 36.0 g/dL   RDW 22.2 (H) 11.5 - 15.5 %   Platelets 215 150 - 400 K/uL   nRBC 0.0 0.0 - 0.2 %  Prepare RBC (crossmatch)     Status: None   Collection Time: 08/24/20 12:54 PM  Result Value Ref Range   Order Confirmation      BB SAMPLE OR UNITS ALREADY AVAILABLE Performed at [facility], [address] [place],  [address]   Basic metabolic panel     Status: Abnormal   Collection Time: 08/25/20  3:17 AM  Result Value Ref Range   Sodium 140 135 - 145 mmol/L   Potassium 3.7 3.5 - 5.1 mmol/L   Chloride 110 98 - 111 mmol/L   CO2 24 22 - 32 mmol/L   Glucose, Bld 126 (H) 70 - 99 mg/dL   BUN 15 8 - 23 mg/dL   Creatinine, Ser 0.76 0.44 - 1.00 mg/dL   Calcium 8.4 (L) 8.9 - 10.3 mg/dL   GFR calc non Af Amer >60 >60 mL/min   GFR calc Af Amer >60 >60 mL/min   Anion gap 6 5 - 15  CBC     Status: Abnormal   Collection Time: 08/25/20  3:17 AM  Result Value Ref Range   WBC 4.4 4.0 - 10.5 K/uL   RBC 3.20 (L) 3.87 - 5.11 MIL/uL    Hemoglobin 8.7 (L) 12.0 - 15.0 g/dL   HCT 28.7 (L) 36 - 46 %   MCV 89.7 80.0 - 100.0 fL   MCH 27.2 26.0 - 34.0 pg   MCHC 30.3 30.0 - 36.0 g/dL   RDW 20.2 (H) 11.5 - 15.5 %   Platelets 196 150 - 400 K/uL   nRBC 0.0 0.0 - 0.2 %  Hemoglobin and hematocrit, blood     Status: Abnormal   Collection Time: 08/25/20  9:13 AM  Result Value Ref Range   Hemoglobin 8.5 (L) 12.0 - 15.0 g/dL   HCT 28.0 (L) 36 - 46 %    Studies/Results: No results found.    Assessment: Chronic GI blood loss resulting in significant anemia.  Recent EGD and colonoscopy unrevealing for active bleeding.  She has a history of small bowel AVMs based on capsule endoscopy in 2015.  Plan:   Update capsule endoscopy.  We will plan to drop the capsule  tomorrow.    SAM F Emmory Solivan 08/25/2020, 10:21 AM  Pager: [phone] If no answer or after 5 PM call [phone]

## 2020-08-25 NOTE — Progress Notes (Signed)
Initial Nutrition Assessment  DOCUMENTATION CODES:   Not applicable  INTERVENTION:  Continue Ensure Enlive po BID, each supplement provides 350 kcal and 20 grams of protein.  Encourage adequate PO intake.   NUTRITION DIAGNOSIS:   Increased nutrient needs related to  (HIV, COPD) as evidenced by estimated needs.  GOAL:   Patient will meet greater than or equal to 90% of their needs  MONITOR:   PO intake, Skin, Supplement acceptance, Weight trends, Labs, I & O's  REASON FOR ASSESSMENT:   Malnutrition Screening Tool    ASSESSMENT:   74 year old female with past medical history notable for seizure disorder, symptomatic bradycardia status post PPM, essential hypertension, HIV, AVM, and nonobstructive CAD who presents with progressive weakness. Pt with low hemoglobin and FOBT positive  Meal completion has been 90%. Pt reports having a good appetite currently and prior to admission with usual consumption of at least 3 meals a day with multiple snacks. Pt with no significant weight loss per weight records. Pt currently has Ensure ordered and has been consuming them. RD to continue with current orders to aid in caloric and protein needs. Plans for capsule endoscopy tomorrow.   NUTRITION - FOCUSED PHYSICAL EXAM:    Most Recent Value  Orbital Region No depletion  Upper Arm Region No depletion  Thoracic and Lumbar Region No depletion  Buccal Region No depletion  Temple Region No depletion  Clavicle Bone Region No depletion  Clavicle and Acromion Bone Region No depletion  Scapular Bone Region No depletion  Dorsal Hand No depletion  Patellar Region No depletion  Anterior Thigh Region No depletion  Posterior Calf Region No depletion  Edema (RD Assessment) None  Hair Reviewed  Eyes Reviewed  Mouth Reviewed  Skin Reviewed  Nails Reviewed     Labs and medications reviewed.   Diet Order:   Diet Order            Diet regular Room service appropriate? Yes; Fluid consistency:  Thin  Diet effective now                 EDUCATION NEEDS:   Not appropriate for education at this time  Skin:  Skin Assessment: Reviewed RN Assessment  Last BM:  9/15  Height:   Ht Readings from Last 1 Encounters:  08/24/20 5\' 2"  (1.575 m)    Weight:   Wt Readings from Last 1 Encounters:  08/24/20 55.8 kg   BMI:  Body mass index is 22.5 kg/m.  Estimated Nutritional Needs:   Kcal:  1650-1850  Protein:  80-90 grams  Fluid:  >/= 1.6 L/day  Corrin Parker, MS, RD, LDN RD pager number/after hours weekend pager number on Amion.

## 2020-08-25 NOTE — Progress Notes (Signed)
PROGRESS NOTE    Nicole Mccormick  KKX:[id number] DOB: Jun 24, 1946 DOA: 08/24/2020 PCP: Sonia Side., FNP    Brief Narrative:  Nicole Mccormick is a 74 year old female with past medical history notable for seizure disorder, symptomatic bradycardia status post PPM, essential hypertension, HIV, AVM, and nonobstructive CAD who presented to the ED with progressive weakness, dizziness with associated nausea and poor appetite.  She was seen by her PCP and sent to the ED for hemoglobin of 4.7.  She reports normal bowel movements and denies any blood in her stool.  She also endorses some shortness of breath.  In the ED, hemoglobin 4.7.  FOBT positive.  Hemodynamically stable.  GI consulted.  Ordered 3 unit PRBC for transfusion.  TRH consulted for admission.  9/17: Following transfusion overnight, hemoglobin responded well with 8.7 this morning.  Dizziness, weakness improved; but continues with shortness of breath although oxygenating well on room air.  GI plans for capsule endoscopy starting tomorrow.  Will obtain PT evaluation today for discharge planning.   Assessment & Plan:   Principal Problem:   Acute blood loss anemia Active Problems:   Human immunodeficiency virus (HIV) disease (HCC)   TOBACCO ABUSE   Essential hypertension, benign   CAD (coronary artery disease)   GI bleed   COPD (chronic obstructive pulmonary disease) (HCC)   Symptomatic anemia likely 2/2 to chronic persistent blood loss from underlying AVMs Shock, hypovolemic: Resolved Patient presenting from PCP office after complaints of weakness, fatigue, shortness of breath and was found to have a hemoglobin of 4.7.  Initially patient with BP 88/58 on admission likely secondary to volume loss from severe anemia/blood loss.  Patient denies any blood in her stools, although FOBT positive on physical exam.  Patient with similar admission in June 2021 with EGD notable for small nonbleeding angiodysplastic lesions duodenum and colonoscopy with  internal/external hemorrhoids and polyps in the sigmoid and ascending colon that were removed.  Previous capsule endoscopy 2015 notable for small bowel AVMs. --[facility] following, appreciate assistance --Transfused 3 unit PRBC on 08/24/2020 --Hemoglobin 4.7>6.1>8.7 (8.4 on 6/28) --Continue to monitor hemoglobin every 6 hours --GI plans capsule endoscopy tomorrow   Iron deficiency anemia Iron studies with iron 11, TIBC 550, ferritin 6, iron saturation 2, B12 321, folate 21. --IV Feraheme today --Monitor hemoglobin as above  HIV Follows with infectious disease outpatient, Dr. Johnnye Sima --Continue [place]  Essential hypertension Patient initially presenting with hypotension with BP 88/58 and hemoglobin 4.7.  Etiology likely volume depletion secondary to severe anemia due to chronic blood loss.  Patient was transfused 3 unit PRBCs with resolution of hypotension and improvement of hemoglobin. --Continue Norvasc 10 mg p.o. daily, carvedilol 3.125 mg p.o. twice daily, lisinopril 40 mg p.o. daily --Hold home HCTZ and Aldactone in the setting of chronic blood loss anemia with low blood pressure on admission --Continue to monitor blood pressure closely and will restart her home medications as indicated.  Seizure disorder Last seizure remote, unclear date  CAD, nonobstructive --Continue Imdur  Tobacco dependence with COPD Encouraged tobacco cessation. --Nicotine patch --Albuterol, Symbicort  HLD: Continue Lipitor  Mood disorder: --Valium as needed   DVT prophylaxis: SCDs, chemical DVT prophylaxis contraindicated in GI bleed, severe anemia Code Status: Full code Family Communication: Updated patient extensively at bedside, attempted to update patient's spouse via telephone unsuccessful  Disposition Plan:  Status is: Inpatient  Remains inpatient appropriate because:Unsafe d/c plan, IV treatments appropriate due to intensity of illness or inability to take PO and Inpatient level of  care  appropriate due to severity of illness   Dispo: The patient is from: Home              Anticipated d/c is to: Home              Anticipated d/c date is: 1 day              Patient currently is not medically stable to d/c.  Consultants:   [facility], Dr. Penelope Coop  Procedures:   Planned capsule endoscopy 08/26/2020  Antimicrobials:   None   Subjective: Patient seen and examined at bedside, resting comfortably.  Continues with some shortness of breath although dizziness, weakness and fatigue much improved following blood transfusion.  Seen by GI this morning with planned capsule endoscopy tomorrow.  Attempted to update patient's spouse via telephone unsuccessful.  No other complaints or concerns at this time.  Denies headache no visual changes, no chest pain, palpitations, no abdominal pain, no fever/chills/night sweats, no nausea/vomiting/diarrhea, no blood in her stool.  No acute events overnight per nursing staff.  Objective: Vitals:   08/24/20 2221 08/25/20 0030 08/25/20 0448 08/25/20 1259  BP: (!) 88/58 133/70 (!) 146/67 120/86  Pulse: 60 60 60 71  Resp: 16 16 17 16   Temp: 98.3 F (36.8 C) 98.1 F (36.7 C) 98 F (36.7 C) 98.4 F (36.9 C)  TempSrc: Oral Oral Oral Oral  SpO2: 95% 97% 99% 97%  Weight:      Height:        Intake/Output Summary (Last 24 hours) at 08/25/2020 1345 Last data filed at 08/25/2020 0300 Gross per 24 hour  Intake 1485.83 ml  Output --  Net 1485.83 ml   Filed Weights   08/24/20 0403  Weight: 55.8 kg    Examination:  General exam: Appears calm and comfortable  Respiratory system: Clear to auscultation. Respiratory effort normal. Cardiovascular system: S1 & S2 heard, RRR. No JVD, murmurs, rubs, gallops or clicks. No pedal edema. Gastrointestinal system: Abdomen is nondistended, soft and nontender. No organomegaly or masses felt. Normal bowel sounds heard. Central nervous system: Alert and oriented. No focal neurological deficits. Extremities:  Symmetric 5 x 5 power. Skin: No rashes, lesions or ulcers Psychiatry: Judgement and insight appear normal. Mood & affect appropriate.     Data Reviewed: I have personally reviewed following labs and imaging studies  CBC: Recent Labs  Lab 08/23/20 1042 08/24/20 0418 08/24/20 1254 08/25/20 0317 08/25/20 0913  WBC 3.8 3.1* 3.8* 4.4  --   HGB 5.4* 4.7* 6.1* 8.7* 8.5*  HCT 18.9* 17.9* 22.0* 28.7* 28.0*  MCV 96.4 104.7* 94.4 89.7  --   PLT 259 234 215 196  --    Basic Metabolic Panel: Recent Labs  Lab 08/23/20 1042 08/24/20 0622 08/25/20 0317  NA 139 139 140  K 4.7 4.6 3.7  CL 107 109 110  CO2 25 23 24   GLUCOSE 105* 108* 126*  BUN 20 24* 15  CREATININE 0.95* 1.05* 0.76  CALCIUM 9.2 8.8* 8.4*   GFR: Estimated Creatinine Clearance: 47.3 mL/min (by C-G formula based on SCr of 0.76 mg/dL). Liver Function Tests: Recent Labs  Lab 08/23/20 1042  AST 11  ALT 6  BILITOT 0.2  PROT 6.7   No results for input(s): LIPASE, AMYLASE in the last 168 hours. No results for input(s): AMMONIA in the last 168 hours. Coagulation Profile: No results for input(s): INR, PROTIME in the last 168 hours. Cardiac Enzymes: No results for input(s): CKTOTAL, CKMB, CKMBINDEX, TROPONINI  in the last 168 hours. BNP (last 3 results) No results for input(s): PROBNP in the last 8760 hours. HbA1C: No results for input(s): HGBA1C in the last 72 hours. CBG: No results for input(s): GLUCAP in the last 168 hours. Lipid Profile: No results for input(s): CHOL, HDL, LDLCALC, TRIG, CHOLHDL, LDLDIRECT in the last 72 hours. Thyroid Function Tests: No results for input(s): TSH, T4TOTAL, FREET4, T3FREE, THYROIDAB in the last 72 hours. Anemia Panel: Recent Labs    08/24/20 0622  VITAMINB12 321  FOLATE 21.0  FERRITIN 6*  TIBC 550*  IRON 11*  RETICCTPCT 5.3*   Sepsis Labs: No results for input(s): PROCALCITON, LATICACIDVEN in the last 168 hours.  Recent Results (from the past 240 hour(s))  SARS  Coronavirus 2 by RT PCR (hospital order, performed in [facility] hospital lab) Nasopharyngeal Nasopharyngeal Swab     Status: None   Collection Time: 08/24/20  7:03 AM   Specimen: Nasopharyngeal Swab  Result Value Ref Range Status   SARS Coronavirus 2 NEGATIVE NEGATIVE Final    Comment: (NOTE) SARS-CoV-2 target nucleic acids are NOT DETECTED.  The SARS-CoV-2 RNA is generally detectable in upper and lower respiratory specimens during the acute phase of infection. The lowest concentration of SARS-CoV-2 viral copies this assay can detect is 250 copies / mL. A negative result does not preclude SARS-CoV-2 infection and should not be used as the sole basis for treatment or other patient management decisions.  A negative result may occur with improper specimen collection / handling, submission of specimen other than nasopharyngeal swab, presence of viral mutation(s) within the areas targeted by this assay, and inadequate number of viral copies (<250 copies / mL). A negative result must be combined with clinical observations, patient history, and epidemiological information.  Fact Sheet for Patients:   StrictlyIdeas.no  Fact Sheet for Healthcare Providers: BankingDealers.co.za  This test is not yet approved or  cleared by the [place] FDA and has been authorized for detection and/or diagnosis of SARS-CoV-2 by FDA under an Emergency Use Authorization (EUA).  This EUA will remain in effect (meaning this test can be used) for the duration of the COVID-19 declaration under Section 564(b)(1) of the Act, 21 U.S.C. section 360bbb-3(b)(1), unless the authorization is terminated or revoked sooner.  Performed at [facility], [place] [address], [place],  [address]          Radiology Studies: No results found.      Scheduled Meds: . amLODipine  10 mg Oral Daily  . atorvastatin  40 mg Oral Daily  .  bictegravir-emtricitabine-tenofovir AF  1 tablet Oral Daily  . carvedilol  3.125 mg Oral BID WC  . feeding supplement (ENSURE ENLIVE)  237 mL Oral BID BM  . gabapentin  100 mg Oral TID  . isosorbide mononitrate  60 mg Oral Daily  . lisinopril  40 mg Oral Daily  . mometasone-formoterol  2 puff Inhalation BID   Continuous Infusions: . sodium chloride    . lactated ringers 100 mL/hr at 08/25/20 1332     LOS: 1 day    Time spent: 39 minutes spent on chart review, discussion with nursing staff, consultants, updating family and interview/physical exam; more than 50% of that time was spent in counseling and/or coordination of care.    Marquel Pottenger J [place] DO [facility] Available via Epic secure chat 7am-7pm After these hours, please refer to coverage provider listed on [email or web address] 08/25/2020, 1:45 PM

## 2020-08-26 ENCOUNTER — Encounter (HOSPITAL_COMMUNITY)
Admission: EM | Disposition: A | Discharge status: Home / Self Care | Payer: Self-pay | Source: Home / Self Care | Attending: Internal Medicine

## 2020-08-26 DIAGNOSIS — K31819 Angiodysplasia of stomach and duodenum without bleeding: Secondary | ICD-10-CM | POA: Diagnosis present

## 2020-08-26 HISTORY — PX: GIVENS CAPSULE STUDY: SHX5432

## 2020-08-26 LAB — CBC
HCT: 29.3 % — ABNORMAL LOW (ref 36.0–46.0)
Hemoglobin: 8.9 g/dL — ABNORMAL LOW (ref 12.0–15.0)
MCH: 27.9 pg (ref 26.0–34.0)
MCHC: 30.4 g/dL (ref 30.0–36.0)
MCV: 91.8 fL (ref 80.0–100.0)
Platelets: 221 10*3/uL (ref 150–400)
RBC: 3.19 MIL/uL — ABNORMAL LOW (ref 3.87–5.11)
RDW: 19.6 % — ABNORMAL HIGH (ref 11.5–15.5)
WBC: 5.2 10*3/uL (ref 4.0–10.5)
nRBC: 0.4 % — ABNORMAL HIGH (ref 0.0–0.2)

## 2020-08-26 LAB — BASIC METABOLIC PANEL
Anion gap: 6 (ref 5–15)
BUN: 13 mg/dL (ref 8–23)
CO2: 27 mmol/L (ref 22–32)
Calcium: 9 mg/dL (ref 8.9–10.3)
Chloride: 107 mmol/L (ref 98–111)
Creatinine, Ser: 0.93 mg/dL (ref 0.44–1.00)
GFR calc Af Amer: >60 mL/min (ref >60)
GFR calc non Af Amer: 60 mL/min — ABNORMAL LOW (ref >60)
Glucose, Bld: 103 mg/dL — ABNORMAL HIGH (ref 70–99)
Potassium: 3.9 mmol/L (ref 3.5–5.1)
Sodium: 140 mmol/L (ref 135–145)

## 2020-08-26 SURGERY — IMAGING PROCEDURE, GI TRACT, INTRALUMINAL, VIA CAPSULE
Anesthesia: LOCAL

## 2020-08-26 MED ORDER — METHOCARBAMOL 500 MG PO TABS: 500.0000 mg | ORAL_TABLET | Freq: Four times a day (QID) | ORAL | 0 refills | Status: AC | PRN

## 2020-08-26 MED ORDER — METHOCARBAMOL 500 MG PO TABS
500.0000 mg | ORAL_TABLET | Freq: Four times a day (QID) | ORAL | Status: DC | PRN
  Administered 2020-08-26 (×2): 500 mg via ORAL
  Filled 2020-08-26 (×2): qty 1

## 2020-08-26 MED ORDER — SODIUM CHLORIDE 0.9 % IV SOLN: INTRAVENOUS | Status: DC

## 2020-08-26 MED ORDER — FERROUS SULFATE 325 (65 FE) MG PO TBEC: 325.0000 mg | DELAYED_RELEASE_TABLET | Freq: Two times a day (BID) | ORAL | 0 refills | Status: AC

## 2020-08-26 SURGICAL SUPPLY — 1 items: TOWEL COTTON PACK 4EA (MISCELLANEOUS) ×4 IMPLANT

## 2020-08-26 NOTE — Progress Notes (Signed)
Nicole Mccormick 1:12 PM  Subjective: Patient asymptomatic from a GI standpoint and has not seen any blood but is on an aspirin a day as well as iron at home and she has had a capsule in the past and her case discussed with my partner Dr. Penelope Coop in her hospital computer chart reviewed and she had no problems swallowing the capsule today  Objective: Vital signs stable afebrile no acute distress patient not examined today looks well plans to go home after capsule hemoglobin stable for 3 days BUN and creatinine normal  Assessment: Subacute GI blood loss probably AVMs  Plan: Okay with me to go home after capsule is complete and will try to read it tomorrow if not we will ask our hospital doctor to read on Monday and have instructed her if she does not hear from my office by Wednesday to give [place] a call  [facility] E  office [phone] After 5PM or if no answer call [phone]

## 2020-08-26 NOTE — Discharge Summary (Signed)
Physician Discharge Summary  Nicole Mccormick RXV:[id number] DOB: 1945-12-24 DOA: 08/24/2020  PCP: Sonia Side., FNP  Admit date: 08/24/2020 Discharge date: 08/26/2020  Admitted From: Home Disposition: Home  Recommendations for Outpatient Follow-up:  1. Follow up with PCP in 1-2 weeks 2. Follow-up with [facility] gastroenterology, or results of capsule endoscopy 3. Please obtain CBC in one week 4. Increased ferrous sulfate to 325 mg p.o. twice daily  Home Health: No Equipment/Devices: None  Discharge Condition: Stable CODE STATUS: Full code Diet recommendation: Heart healthy diet  History of present illness:  Nicole Mccormick is a 74 year old female with past medical history notable for seizure disorder, symptomatic bradycardia status post PPM, essential hypertension, HIV, AVM, and nonobstructive CAD who presented to the ED with progressive weakness, dizziness with associated nausea and poor appetite.  She was seen by her PCP and sent to the ED for hemoglobin of 4.7.  She reports normal bowel movements and denies any blood in her stool.  She also endorses some shortness of breath.  In the ED, hemoglobin 4.7.  FOBT positive.  Hemodynamically stable.  GI consulted.  Ordered 3 unit PRBC for transfusion.  TRH consulted for admission.  Hospital course:  Symptomatic anemia likely 2/2 to chronic persistent blood loss from underlying AVMs Shock, hypovolemic: Resolved Patient presenting from PCP office after complaints of weakness, fatigue, shortness of breath and was found to have a hemoglobin of 4.7.  Initially patient with BP 88/58 on admission likely secondary to volume loss from severe anemia/blood loss.  Patient denies any blood in her stools, although FOBT positive on physical exam.  Patient with similar admission in June 2021 with EGD notable for small nonbleeding angiodysplastic lesions duodenum and colonoscopy with internal/external hemorrhoids and polyps in the sigmoid and ascending colon that  were removed.  Previous capsule endoscopy 2015 notable for small bowel AVMs.  [facility] was consulted and followed during hospital course.  Patient was transfused with 3 unit PRBCs on 08/24/2020 with appropriate rise of hemoglobin to 8.7.  Patient underwent capsule endoscopy on 08/26/2020 with results pending at time of discharge.  Patient's hemoglobin remained stable and was 8.9 at time of discharge.  Recommend repeat CBC in 1 week.  Iron deficiency anemia Iron studies with iron 11, TIBC 550, ferritin 6, iron saturation 2, B12 321, folate 21.  Received IV Feraheme on 08/25/2020.  Increased home ferrous sulfate to 325 mg p.o. twice daily.  HIV Follows with infectious disease outpatient, Dr. Johnnye Sima. Continue Biktarvy  Essential hypertension Patient initially presenting with hypotension with BP 88/58 and hemoglobin 4.7.  Etiology likely volume depletion secondary to severe anemia due to chronic blood loss.  Patient was transfused 3 unit PRBCs with resolution of hypotension and improvement of hemoglobin. Continue Norvasc 10 mg p.o. daily, carvedilol 3.125 mg p.o. twice daily, lisinopril 40 mg p.o. daily, HCTZ and Aldactone.  Seizure disorder Last seizure remote, unclear date  CAD, nonobstructive Continue Imdur  Tobacco dependence with COPD Encouraged tobacco cessation.  Oxygen well on room air.  Tinea albuterol, Symbicort  HLD: Continue Lipitor  Mood disorder: Valium as needed  Discharge Diagnoses:  Active Problems:   Human immunodeficiency virus (HIV) disease ([place])   TOBACCO ABUSE   Essential hypertension, benign   CAD (coronary artery disease)   COPD (chronic obstructive pulmonary disease) (HCC)   AVM (arteriovenous malformation) of stomach, acquired    Discharge Instructions  Discharge Instructions    Call MD for:  difficulty breathing, headache or visual disturbances   Complete by:  As directed    Call MD for:  extreme fatigue   Complete by: As directed    Call MD for:   persistant dizziness or light-headedness   Complete by: As directed    Call MD for:  persistant nausea and vomiting   Complete by: As directed    Call MD for:  severe uncontrolled pain   Complete by: As directed    Call MD for:  temperature >100.4   Complete by: As directed    Diet - low sodium heart healthy   Complete by: As directed    Increase activity slowly   Complete by: As directed      Allergies as of 08/26/2020      Reactions   Penicillins    REACTION: GOES INTO SHOCK & THEN PASSES OU Has patient had a PCN reaction causing immediate rash, facial/tongue/throat swelling, SOB or lightheadedness with hypotension: NO Has patient had a PCN reaction causing severe rash involving mucus membranes or skin necrosis: NO Has patient had a PCN reaction that required hospitalization NO Has patient had a PCN reaction occurring within the last 10 years: NO If all of the above answers are "NO", then may proceed with Cephalosporin use.   Avelox [moxifloxacin Hcl In Nacl] Itching, Rash      Medication List    TAKE these medications   acyclovir 400 MG tablet Commonly known as: ZOVIRAX Take 1 tablet (400 mg total) by mouth daily as needed (for flare ups).   albuterol 108 (90 Base) MCG/ACT inhaler Commonly known as: ProAir HFA INHALE 2 PUFFS BY MOUTH EVERY 4 HOURS IF NEEDED FOR WHEEZING OR SHORTNESS OF BREATH What changed:   how much to take  how to take this  when to take this  reasons to take this  additional instructions   amLODipine 10 MG tablet Commonly known as: NORVASC TAKE 1 TABLET BY MOUTH EVERY DAY   aspirin 81 MG EC tablet Take 81 mg by mouth daily.   atorvastatin 40 MG tablet Commonly known as: LIPITOR take 1 tablet by mouth once daily AT 6 PM What changed:   how much to take  how to take this  when to take this   Biktarvy 50-200-25 MG Tabs tablet Generic drug: bictegravir-emtricitabine-tenofovir AF TAKE 1 TABLET BY MOUTH EVERY DAY   carvedilol 3.125  MG tablet Commonly known as: COREG TAKE 1 TABLET(3.125 MG) BY MOUTH TWICE DAILY What changed: See the new instructions.   Cetirizine HCl 10 MG Tbdp Commonly known as: ZyrTEC Allergy Take 1 tablet by mouth daily. What changed:   how much to take  when to take this  reasons to take this   cholecalciferol 1000 units tablet Commonly known as: VITAMIN D Take 1,000 Units by mouth daily.   Delsym 30 MG/5ML liquid Generic drug: dextromethorphan Take 30 mg by mouth as needed for cough.   diazepam 2 MG tablet Commonly known as: VALIUM take 1 tablet by mouth every 6 hours if needed for anxiety What changed:   how much to take  how to take this  when to take this  reasons to take this  additional instructions   escitalopram 10 MG tablet Commonly known as: LEXAPRO Take 10 mg by mouth daily as needed (anxiety).   ferrous sulfate 325 (65 FE) MG EC tablet Take 1 tablet (325 mg total) by mouth in the morning and at bedtime. What changed: when to take this   Fish Oil 1000 MG Cpdr Take 1,000 mg by  mouth daily.   Flax Seed Oil 1000 MG Caps Take 1,000 mg by mouth daily.   gabapentin 100 MG capsule Commonly known as: NEURONTIN Take 100 mg by mouth 3 (three) times daily.   hydrochlorothiazide 25 MG tablet Commonly known as: HYDRODIURIL Take 25 mg by mouth daily.   isosorbide mononitrate 60 MG 24 hr tablet Commonly known as: IMDUR Take 1 tablet (60 mg total) by mouth daily.   lisinopril 40 MG tablet Commonly known as: ZESTRIL Take 40 mg by mouth daily.   meclizine 25 MG tablet Commonly known as: ANTIVERT Take 25 mg by mouth as needed for dizziness.   methocarbamol 500 MG tablet Commonly known as: ROBAXIN Take 1 tablet (500 mg total) by mouth every 6 (six) hours as needed for muscle spasms.   pantoprazole 40 MG tablet Commonly known as: PROTONIX Take 1 tablet (40 mg total) by mouth daily.   spironolactone 25 MG tablet Commonly known as: ALDACTONE TAKE 1  TABLET BY MOUTH EVERY DAY   Symbicort 160-4.5 MCG/ACT inhaler Generic drug: budesonide-formoterol Inhale 2 puffs into the lungs 2 (two) times daily.   triamcinolone cream 0.1 % Commonly known as: KENALOG Apply 1 application topically 2 (two) times daily. What changed:   when to take this  reasons to take this   vitamin A 10000 UNIT capsule Take 10,000 Units by mouth daily.   vitamin B-12 1000 MCG tablet Commonly known as: CYANOCOBALAMIN Take 1,000 mcg by mouth daily.   vitamin C 1000 MG tablet Take 1,000 mg by mouth daily.       Follow-up Information    Sonia Side., FNP. Schedule an appointment as soon as possible for a visit in 1 week(s).   Specialty: Family Medicine Contact information: [place] [address] [phone]        Arta Silence, MD. Schedule an appointment as soon as possible for a visit in 2 week(s).   Specialty: Gastroenterology Contact information: [address] [address] [phone]              Allergies  Allergen Reactions  . Penicillins     REACTION: GOES INTO SHOCK & THEN PASSES OU Has patient had a PCN reaction causing immediate rash, facial/tongue/throat swelling, SOB or lightheadedness with hypotension: NO Has patient had a PCN reaction causing severe rash involving mucus membranes or skin necrosis: NO Has patient had a PCN reaction that required hospitalization NO Has patient had a PCN reaction occurring within the last 10 years: NO If all of the above answers are "NO", then may proceed with Cephalosporin use.   . Avelox [Moxifloxacin Hcl In Nacl] Itching and Rash    Consultations:  [facility]   Procedures/Studies: DG CHEST PORT 1 VIEW  Result Date: 08/25/2020 CLINICAL DATA:  Shortness of breath, blood loss, anemia EXAM: PORTABLE CHEST 1 VIEW COMPARISON:  Chest x-ray 06/03/2020 FINDINGS: Left chest wall dual lead cardiac pacemaker in similar position. The  heart size and mediastinal contours are unchanged. Aortic arch calcifications. Persistent trace biapical/pulmonary scarring. No focal consolidation. No pulmonary edema. No pleural effusion. No pneumothorax. No acute osseous abnormality. IMPRESSION: No active cardiopulmonary disease. Electronically Signed   By: Iven Finn M.D.   On: 08/25/2020 18:22     Subjective: Patient seen and examined bedside, resting comfortably.  Had capsule endoscopy placed earlier this morning, will need to keep monitor on till around 815pm tonight and then will be ready for discharge with outpatient follow-up with  GI.  Hemoglobin stable, up to 8.9 following transfusion yesterday.  Denies headache, no dizziness, no visual changes, no chest pain, palpitations, no shortness of breath, no abdominal pain, no weakness, no fatigue, no paresthesias.  No acute events overnight per nursing.  Discharge Exam: Vitals:   08/26/20 1215 08/26/20 1339  BP: (!) 152/78   Pulse: 62   Resp: 17   Temp: 98.1 F (36.7 C)   SpO2: 99% 97%   Vitals:   08/26/20 0545 08/26/20 0855 08/26/20 1215 08/26/20 1339  BP: (!) 162/80 (!) 172/92 (!) 152/78   Pulse: 61 64 62   Resp: 15  17   Temp: 98 F (36.7 C)  98.1 F (36.7 C)   TempSrc: Oral  Oral   SpO2: 95%  99% 97%  Weight:      Height:        General: Pt is alert, awake, not in acute distress Cardiovascular: RRR, S1/S2 +, no rubs, no gallops Respiratory: CTA bilaterally, no wheezing, no rhonchi, oxygenating well on room air Abdominal: Soft, NT, ND, bowel sounds + Extremities: no edema, no cyanosis    The results of significant diagnostics from this hospitalization (including imaging, microbiology, ancillary and laboratory) are listed below for reference.     Microbiology: Recent Results (from the past 240 hour(s))  SARS Coronavirus 2 by RT PCR (hospital order, performed in [facility] hospital lab) Nasopharyngeal Nasopharyngeal Swab     Status: None   Collection Time:  08/24/20  7:03 AM   Specimen: Nasopharyngeal Swab  Result Value Ref Range Status   SARS Coronavirus 2 NEGATIVE NEGATIVE Final    Comment: (NOTE) SARS-CoV-2 target nucleic acids are NOT DETECTED.  The SARS-CoV-2 RNA is generally detectable in upper and lower respiratory specimens during the acute phase of infection. The lowest concentration of SARS-CoV-2 viral copies this assay can detect is 250 copies / mL. A negative result does not preclude SARS-CoV-2 infection and should not be used as the sole basis for treatment or other patient management decisions.  A negative result may occur with improper specimen collection / handling, submission of specimen other than nasopharyngeal swab, presence of viral mutation(s) within the areas targeted by this assay, and inadequate number of viral copies (<250 copies / mL). A negative result must be combined with clinical observations, patient history, and epidemiological information.  Fact Sheet for Patients:   StrictlyIdeas.no  Fact Sheet for Healthcare Providers: BankingDealers.co.za  This test is not yet approved or  cleared by the [place] FDA and has been authorized for detection and/or diagnosis of SARS-CoV-2 by FDA under an Emergency Use Authorization (EUA).  This EUA will remain in effect (meaning this test can be used) for the duration of the COVID-19 declaration under Section 564(b)(1) of the Act, 21 U.S.C. section 360bbb-3(b)(1), unless the authorization is terminated or revoked sooner.  Performed at [facility], [place] [address], [place], [place] [address]      Labs: BNP (last 3 results) No results for input(s): BNP in the last 8760 hours. Basic Metabolic Panel: Recent Labs  Lab 08/23/20 1042 08/24/20 0622 08/25/20 0317 08/26/20 0307  NA 139 139 140 140  K 4.7 4.6 3.7 3.9  CL 107 109 110 107  CO2 25 23 24 27   GLUCOSE 105* 108* 126* 103*  BUN 20 24* 15 13   CREATININE 0.95* 1.05* 0.76 0.93  CALCIUM 9.2 8.8* 8.4* 9.0   Liver Function Tests: Recent Labs  Lab 08/23/20 1042  AST 11  ALT 6  BILITOT 0.2  PROT 6.7   No results for input(s): LIPASE, AMYLASE in the last 168 hours. No results for input(s): AMMONIA in the last 168 hours. CBC: Recent Labs  Lab 08/23/20 1042 08/23/20 1042 08/24/20 0418 08/24/20 0418 08/24/20 1254 08/24/20 1254 08/25/20 0317 08/25/20 0913 08/25/20 1519 08/25/20 2029 08/26/20 0307  WBC 3.8  --  3.1*  --  3.8*  --  4.4  --   --   --  5.2  HGB 5.4*   < > 4.7*   < > 6.1*   < > 8.7* 8.5* 8.7* 8.8* 8.9*  HCT 18.9*   < > 17.9*   < > 22.0*   < > 28.7* 28.0* 28.6* 29.2* 29.3*  MCV 96.4  --  104.7*  --  94.4  --  89.7  --   --   --  91.8  PLT 259  --  234  --  215  --  196  --   --   --  221   < > = values in this interval not displayed.   Cardiac Enzymes: No results for input(s): CKTOTAL, CKMB, CKMBINDEX, TROPONINI in the last 168 hours. BNP: Invalid input(s): POCBNP CBG: No results for input(s): GLUCAP in the last 168 hours. D-Dimer No results for input(s): DDIMER in the last 72 hours. Hgb A1c No results for input(s): HGBA1C in the last 72 hours. Lipid Profile No results for input(s): CHOL, HDL, LDLCALC, TRIG, CHOLHDL, LDLDIRECT in the last 72 hours. Thyroid function studies No results for input(s): TSH, T4TOTAL, T3FREE, THYROIDAB in the last 72 hours.  Invalid input(s): FREET3 Anemia work up Recent Labs    08/24/20 0622  VITAMINB12 321  FOLATE 21.0  FERRITIN 6*  TIBC 550*  IRON 11*  RETICCTPCT 5.3*   Urinalysis    Component Value Date/Time   COLORURINE YELLOW 06/03/2020 1618   APPEARANCEUR CLEAR 06/03/2020 1618   LABSPEC 1.025 06/03/2020 1618   PHURINE 5.5 06/03/2020 1618   GLUCOSEU NEGATIVE 06/03/2020 1618   HGBUR TRACE (A) 06/03/2020 1618   BILIRUBINUR NEGATIVE 06/03/2020 1618   KETONESUR NEGATIVE 06/03/2020 1618   PROTEINUR 30 (A) 06/03/2020 1618   UROBILINOGEN 0.2 02/12/2015  1030   NITRITE NEGATIVE 06/03/2020 1618   LEUKOCYTESUR NEGATIVE 06/03/2020 1618   Sepsis Labs Invalid input(s): PROCALCITONIN,  WBC,  LACTICIDVEN Microbiology Recent Results (from the past 240 hour(s))  SARS Coronavirus 2 by RT PCR (hospital order, performed in [facility] hospital lab) Nasopharyngeal Nasopharyngeal Swab     Status: None   Collection Time: 08/24/20  7:03 AM   Specimen: Nasopharyngeal Swab  Result Value Ref Range Status   SARS Coronavirus 2 NEGATIVE NEGATIVE Final    Comment: (NOTE) SARS-CoV-2 target nucleic acids are NOT DETECTED.  The SARS-CoV-2 RNA is generally detectable in upper and lower respiratory specimens during the acute phase of infection. The lowest concentration of SARS-CoV-2 viral copies this assay can detect is 250 copies / mL. A negative result does not preclude SARS-CoV-2 infection and should not be used as the sole basis for treatment or other patient management decisions.  A negative result may occur with improper specimen collection / handling, submission of specimen other than nasopharyngeal swab, presence of viral mutation(s) within the areas targeted by this assay, and inadequate number of viral copies (<250 copies / mL). A negative result must be combined with clinical observations, patient history, and epidemiological information.  Fact Sheet for Patients:   StrictlyIdeas.no  Fact Sheet for Healthcare  Providers: BankingDealers.co.za  This test is not yet approved or  cleared by the [place] and has been authorized for detection and/or diagnosis of SARS-CoV-2 by FDA under an Emergency Use Authorization (EUA).  This EUA will remain in effect (meaning this test can be used) for the duration of the COVID-19 declaration under Section 564(b)(1) of the Act, 21 U.S.C. section 360bbb-3(b)(1), unless the authorization is terminated or revoked sooner.  Performed at [facility],  [place] [address] [place], [place] [address]      Time coordinating discharge: Over 30 minutes  SIGNED:   Jillien Yakel J [place] DO  [facility] 08/26/2020, 2:08 PM

## 2020-08-26 NOTE — Progress Notes (Signed)
Consent signed and handed to endo RN

## 2020-08-26 NOTE — Evaluation (Signed)
Physical Therapy Evaluation Patient Details Name: Nicole Mccormick MRN: [id number] DOB: 11-02-1946 Today's Date: 08/26/2020   History of Present Illness  The pt is a 74 yo female presenting with ongoing weakness, fatigue, and dizziness. Upon admission pt found to have GI bleed with symptomatic anemia, Hgb of 5.4 upon admission, s/p 3 units PRBC and undergoing contineud work-up to determine cause. PMH includes: seizures, pacemaker placement, HTN, HIV, AVM, and CAD.    Clinical Impression  Pt in bed upon arrival of PT, agreeable to evaluation at this time. Prior to admission the pt was independent with use of a RW or cane at baseline, living with her husband, 53 yo granddaughter, and great grandson. She has an aide that comes every evening for a few hours to assist with bathing, sometimes cooking, and generally preparing for bed. The pt now presents with minor limitations in functional mobility and dynamic stability due to above dx, but is safe to return home with assist of family and aide as needed. She was able to demonstrate good independence with transfers, hallway ambulation, and stair navigation without use of AD. We also discussed use of AD at home for energy conservation and improved stability. All education completed and needs met at this time. Thank you for the consult.     Follow Up Recommendations No PT follow up;Supervision for mobility/OOB    Equipment Recommendations  None recommended by PT    Recommendations for Other Services       Precautions / Restrictions Precautions Precautions: Fall Precaution Comments: low fall Restrictions Weight Bearing Restrictions: No      Mobility  Bed Mobility Overal bed mobility: Independent                Transfers Overall transfer level: Independent               General transfer comment: able to stand from various surfaces without AD or assist  Ambulation/Gait Ambulation/Gait assistance: Supervision Gait Distance (Feet):  150 Feet Assistive device: None Gait Pattern/deviations: Drifts right/left;Step-through pattern;Decreased stride length   Gait velocity interpretation: <1.8 ft/sec, indicate of risk for recurrent falls General Gait Details: slow gait with minor instability laterally with head turns and changes in direction, pt states she uses RW or quad cane mostly at home  Stairs Stairs: Yes Stairs assistance: Supervision Stair Management: One rail Right Number of Stairs: 7 General stair comments: pt completed 7 steps with use of singe rail and supervision for safety. no LOB, slow but steady step-to pattern  Wheelchair Mobility    Modified Rankin (Stroke Patients Only)       Balance Overall balance assessment: Mild deficits observed, not formally tested                                           Pertinent Vitals/Pain Pain Assessment: No/denies pain    Home Living Family/patient expects to be discharged to:: Private residence Living Arrangements: Spouse/significant other;Other relatives (76 yo Curator, 81 yo great grandson) Available Help at Discharge: Family;Available 24 hours/day Type of Home: Apartment Home Access: Stairs to enter Entrance Stairs-Rails: None Entrance Stairs-Number of Steps: 5 Home Layout: Two level Home Equipment: Clinical cytogeneticist - 2 wheels;Cane - quad Additional Comments: aide for a few hours each night to assist in getting the pt and her husband ready for bed and bathing them. sometimes helps with meals    Prior Function Level  of Independence: Needs assistance   Gait / Transfers Assistance Needed: uses RW or quad cane as needed for mobility  ADL's / Homemaking Assistance Needed: pt bathes with assist from aide, can help with cooking or cleaning, but sometimes aide does these tasks as well        Hand Dominance   Dominant Hand: Right    Extremity/Trunk Assessment   Upper Extremity Assessment Upper Extremity Assessment: Overall WFL  for tasks assessed    Lower Extremity Assessment Lower Extremity Assessment: Overall WFL for tasks assessed    Cervical / Trunk Assessment Cervical / Trunk Assessment: Normal  Communication   Communication: No difficulties  Cognition Arousal/Alertness: Awake/alert Behavior During Therapy: WFL for tasks assessed/performed Overall Cognitive Status: Within Functional Limits for tasks assessed                                        General Comments General comments (skin integrity, edema, etc.): mild instability with dynamic activities without UE support, 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    Exercises     Assessment/Plan    PT Assessment Patent does not need any further PT services  PT Problem List Decreased mobility;Decreased balance       PT Treatment Interventions DME instruction;Gait training;Balance training;Stair training;Functional mobility training;Therapeutic activities;Patient/family education    PT Goals (Current goals can be found in the Care Plan section)  Acute Rehab PT Goals Patient Stated Goal: return home today PT Goal Formulation: With patient Time For Goal Achievement: 09/09/20 Potential to Achieve Goals: Good    Frequency     Barriers to discharge        Co-evaluation               AM-PAC PT "6 Clicks" Mobility  Outcome Measure Help needed turning from your back to your side while in a flat bed without using bedrails?: None Help needed moving from lying on your back to sitting on the side of a flat bed without using bedrails?: None Help needed moving to and from a bed to a chair (including a wheelchair)?: None Help needed standing up from a chair using your arms (e.g., wheelchair or bedside chair)?: None Help needed to walk in hospital room?: A Little Help needed climbing 3-5 steps with a railing? : A Little 6 Click Score: 22    End of Session   Activity Tolerance: Patient tolerated treatment  well Patient left: in bed;with call bell/phone within reach Nurse Communication: Mobility status PT Visit Diagnosis: Difficulty in walking, not elsewhere classified (R26.2)    Time: 4835-0757 PT Time Calculation (min) (ACUTE ONLY): 23 min   Charges:   PT Evaluation $PT Eval Low Complexity: 1 Low PT Treatments $Gait Training: 8-22 mins        Karma Ganja, PT, DPT   Acute Rehabilitation Department Pager #: [phone]  Otho Bellows 08/26/2020, 3:16 PM

## 2020-08-26 NOTE — Progress Notes (Signed)
Patient ingested pill capsule at 0813. Instructions for diet information given to patient and RN. RN instructed to remove leads and monitor at 2013pm on 9/18. Endo staff will pick up monitor on 9/19

## 2020-08-26 NOTE — Progress Notes (Signed)
Pt IV removed, catheter intact  Pt discharge instructions went over at bedside Pt has all belongings  Pt will discharge at 2013 when capsule study completes  Night shift RN aware and will collect leads/belt and monitor  Will continue to monitor

## 2020-08-27 ENCOUNTER — Encounter: Payer: Self-pay | Admitting: Gastroenterology

## 2020-08-27 LAB — CBC
HCT: 18.9 % — ABNORMAL LOW (ref 35.0–45.0)
Hemoglobin: 5.4 g/dL — CL (ref 11.7–15.5)
MCH: 27.6 pg (ref 27.0–33.0)
MCHC: 28.6 g/dL — ABNORMAL LOW (ref 32.0–36.0)
MCV: 96.4 fL (ref 80.0–100.0)
MPV: 10.9 fL (ref 7.5–12.5)
Platelets: 259 10*3/uL (ref 140–400)
RBC: 1.96 10*6/uL — ABNORMAL LOW (ref 3.80–5.10)
RDW: 17 % — ABNORMAL HIGH (ref 11.0–15.0)
WBC: 3.8 10*3/uL (ref 3.8–10.8)

## 2020-08-27 LAB — COMPREHENSIVE METABOLIC PANEL
AG Ratio: 1.6 (calc) (ref 1.0–2.5)
ALT: 6 U/L (ref 6–29)
AST: 11 U/L (ref 10–35)
Albumin: 4.1 g/dL (ref 3.6–5.1)
Alkaline phosphatase (APISO): 60 U/L (ref 37–153)
BUN/Creatinine Ratio: 21 (calc) (ref 6–22)
BUN: 20 mg/dL (ref 7–25)
CO2: 25 mmol/L (ref 20–32)
Calcium: 9.2 mg/dL (ref 8.6–10.4)
Chloride: 107 mmol/L (ref 98–110)
Creat: 0.95 mg/dL — ABNORMAL HIGH (ref 0.60–0.93)
Globulin: 2.6 g/dL (calc) (ref 1.9–3.7)
Glucose, Bld: 105 mg/dL — ABNORMAL HIGH (ref 65–99)
Potassium: 4.7 mmol/L (ref 3.5–5.3)
Sodium: 139 mmol/L (ref 135–146)
Total Bilirubin: 0.2 mg/dL (ref 0.2–1.2)
Total Protein: 6.7 g/dL (ref 6.1–8.1)

## 2020-08-27 LAB — HIV-1 RNA ULTRAQUANT REFLEX TO GENTYP+
HIV 1 RNA Quant: <20 copies/mL
HIV-1 RNA Quant, Log: <1.3 Log copies/mL

## 2020-08-27 NOTE — Progress Notes (Unsigned)
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

## 2020-08-29 ENCOUNTER — Encounter (HOSPITAL_COMMUNITY): Payer: Self-pay | Admitting: Gastroenterology

## 2020-09-10 LAB — CUP PACEART REMOTE DEVICE CHECK
Battery Remaining Longevity: 121 mo
Battery Remaining Percentage: 95.5 %
Battery Voltage: 2.99 V
Brady Statistic AP VP Percent: 1.4 %
Brady Statistic AP VS Percent: 92 %
Brady Statistic AS VP Percent: 1 %
Brady Statistic AS VS Percent: 6.9 %
Brady Statistic RA Percent Paced: 92 %
Brady Statistic RV Percent Paced: 1.4 %
Date Time Interrogation Session: 20210915020018
Implantable Lead Implant Date: 20190228
Implantable Lead Implant Date: 20190228
Implantable Lead Location: 753859
Implantable Lead Location: 753860
Implantable Pulse Generator Implant Date: 20190228
Lead Channel Impedance Value: 330 Ohm
Lead Channel Impedance Value: 460 Ohm
Lead Channel Pacing Threshold Amplitude: 0.375 V
Lead Channel Pacing Threshold Amplitude: 0.625 V
Lead Channel Pacing Threshold Pulse Width: 0.5 ms
Lead Channel Pacing Threshold Pulse Width: 0.5 ms
Lead Channel Sensing Intrinsic Amplitude: 2.4 mV
Lead Channel Sensing Intrinsic Amplitude: 8.6 mV
Lead Channel Setting Pacing Amplitude: 0.875
Lead Channel Setting Pacing Amplitude: 1.375
Lead Channel Setting Pacing Pulse Width: 0.5 ms
Lead Channel Setting Sensing Sensitivity: 2 mV
Pulse Gen Model: 2272
Pulse Gen Serial Number: 8996997

## 2020-09-13 ENCOUNTER — Other Ambulatory Visit: Payer: Medicare Other

## 2020-09-14 NOTE — Progress Notes (Signed)
Remote pacemaker transmission.   

## 2020-10-29 ENCOUNTER — Other Ambulatory Visit: Payer: Self-pay

## 2020-10-29 ENCOUNTER — Inpatient Hospital Stay (HOSPITAL_COMMUNITY)
Admission: EM | Admit: 2020-10-29 | Discharge: 2020-10-31 | DRG: 377 | Disposition: A | Discharge status: Home / Self Care | Payer: 59 | Attending: Internal Medicine | Admitting: Internal Medicine

## 2020-10-29 DIAGNOSIS — F32A Depression, unspecified: Secondary | ICD-10-CM | POA: Diagnosis present

## 2020-10-29 DIAGNOSIS — B20 Human immunodeficiency virus [HIV] disease: Secondary | ICD-10-CM | POA: Diagnosis present

## 2020-10-29 DIAGNOSIS — I350 Nonrheumatic aortic (valve) stenosis: Secondary | ICD-10-CM | POA: Diagnosis present

## 2020-10-29 DIAGNOSIS — J449 Chronic obstructive pulmonary disease, unspecified: Secondary | ICD-10-CM | POA: Diagnosis present

## 2020-10-29 DIAGNOSIS — D649 Anemia, unspecified: Secondary | ICD-10-CM

## 2020-10-29 DIAGNOSIS — D62 Acute posthemorrhagic anemia: Secondary | ICD-10-CM | POA: Diagnosis present

## 2020-10-29 DIAGNOSIS — K5521 Angiodysplasia of colon with hemorrhage: Secondary | ICD-10-CM | POA: Diagnosis present

## 2020-10-29 DIAGNOSIS — R578 Other shock: Secondary | ICD-10-CM | POA: Diagnosis present

## 2020-10-29 DIAGNOSIS — Z881 Allergy status to other antibiotic agents status: Secondary | ICD-10-CM

## 2020-10-29 DIAGNOSIS — K219 Gastro-esophageal reflux disease without esophagitis: Secondary | ICD-10-CM | POA: Diagnosis present

## 2020-10-29 DIAGNOSIS — Z880 Allergy status to penicillin: Secondary | ICD-10-CM

## 2020-10-29 DIAGNOSIS — R571 Hypovolemic shock: Secondary | ICD-10-CM | POA: Diagnosis present

## 2020-10-29 DIAGNOSIS — Z7982 Long term (current) use of aspirin: Secondary | ICD-10-CM

## 2020-10-29 DIAGNOSIS — I10 Essential (primary) hypertension: Secondary | ICD-10-CM | POA: Diagnosis present

## 2020-10-29 DIAGNOSIS — B182 Chronic viral hepatitis C: Secondary | ICD-10-CM | POA: Diagnosis not present

## 2020-10-29 DIAGNOSIS — R569 Unspecified convulsions: Secondary | ICD-10-CM | POA: Diagnosis present

## 2020-10-29 DIAGNOSIS — E785 Hyperlipidemia, unspecified: Secondary | ICD-10-CM | POA: Diagnosis present

## 2020-10-29 DIAGNOSIS — I2510 Atherosclerotic heart disease of native coronary artery without angina pectoris: Secondary | ICD-10-CM | POA: Diagnosis present

## 2020-10-29 DIAGNOSIS — F17210 Nicotine dependence, cigarettes, uncomplicated: Secondary | ICD-10-CM | POA: Diagnosis present

## 2020-10-29 DIAGNOSIS — Z7951 Long term (current) use of inhaled steroids: Secondary | ICD-10-CM

## 2020-10-29 DIAGNOSIS — M1990 Unspecified osteoarthritis, unspecified site: Secondary | ICD-10-CM | POA: Diagnosis present

## 2020-10-29 DIAGNOSIS — K2970 Gastritis, unspecified, without bleeding: Secondary | ICD-10-CM | POA: Diagnosis present

## 2020-10-29 DIAGNOSIS — Z90710 Acquired absence of both cervix and uterus: Secondary | ICD-10-CM | POA: Diagnosis not present

## 2020-10-29 DIAGNOSIS — Z86010 Personal history of colonic polyps: Secondary | ICD-10-CM

## 2020-10-29 DIAGNOSIS — I495 Sick sinus syndrome: Secondary | ICD-10-CM | POA: Diagnosis present

## 2020-10-29 DIAGNOSIS — R011 Cardiac murmur, unspecified: Secondary | ICD-10-CM | POA: Diagnosis present

## 2020-10-29 DIAGNOSIS — K31811 Angiodysplasia of stomach and duodenum with bleeding: Secondary | ICD-10-CM | POA: Diagnosis not present

## 2020-10-29 DIAGNOSIS — Z950 Presence of cardiac pacemaker: Secondary | ICD-10-CM | POA: Diagnosis not present

## 2020-10-29 DIAGNOSIS — B1920 Unspecified viral hepatitis C without hepatic coma: Secondary | ICD-10-CM | POA: Diagnosis present

## 2020-10-29 DIAGNOSIS — Z20822 Contact with and (suspected) exposure to covid-19: Secondary | ICD-10-CM | POA: Diagnosis present

## 2020-10-29 DIAGNOSIS — R531 Weakness: Secondary | ICD-10-CM | POA: Diagnosis not present

## 2020-10-29 DIAGNOSIS — Z21 Asymptomatic human immunodeficiency virus [HIV] infection status: Secondary | ICD-10-CM | POA: Diagnosis present

## 2020-10-29 DIAGNOSIS — I2583 Coronary atherosclerosis due to lipid rich plaque: Secondary | ICD-10-CM | POA: Diagnosis not present

## 2020-10-29 DIAGNOSIS — K31819 Angiodysplasia of stomach and duodenum without bleeding: Secondary | ICD-10-CM | POA: Diagnosis present

## 2020-10-29 DIAGNOSIS — K922 Gastrointestinal hemorrhage, unspecified: Secondary | ICD-10-CM

## 2020-10-29 DIAGNOSIS — I119 Hypertensive heart disease without heart failure: Secondary | ICD-10-CM | POA: Diagnosis present

## 2020-10-29 DIAGNOSIS — K648 Other hemorrhoids: Secondary | ICD-10-CM | POA: Diagnosis present

## 2020-10-29 DIAGNOSIS — R609 Edema, unspecified: Secondary | ICD-10-CM | POA: Diagnosis present

## 2020-10-29 DIAGNOSIS — E861 Hypovolemia: Secondary | ICD-10-CM

## 2020-10-29 DIAGNOSIS — Z79899 Other long term (current) drug therapy: Secondary | ICD-10-CM

## 2020-10-29 DIAGNOSIS — I9589 Other hypotension: Secondary | ICD-10-CM

## 2020-10-29 LAB — TROPONIN I (HIGH SENSITIVITY): Troponin I (High Sensitivity): 9 ng/L (ref <18)

## 2020-10-29 LAB — CBG MONITORING, ED: Glucose-Capillary: 99 mg/dL (ref 70–99)

## 2020-10-29 LAB — RESPIRATORY PANEL BY RT PCR (FLU A&B, COVID)
Influenza A by PCR: NEGATIVE
Influenza B by PCR: NEGATIVE
SARS Coronavirus 2 by RT PCR: NEGATIVE

## 2020-10-29 LAB — BASIC METABOLIC PANEL
Anion gap: 8 (ref 5–15)
BUN: 21 mg/dL (ref 8–23)
CO2: 24 mmol/L (ref 22–32)
Calcium: 8.6 mg/dL — ABNORMAL LOW (ref 8.9–10.3)
Chloride: 107 mmol/L (ref 98–111)
Creatinine, Ser: 1.27 mg/dL — ABNORMAL HIGH (ref 0.44–1.00)
GFR, Estimated: 44 mL/min — ABNORMAL LOW (ref >60)
Glucose, Bld: 115 mg/dL — ABNORMAL HIGH (ref 70–99)
Potassium: 4 mmol/L (ref 3.5–5.1)
Sodium: 139 mmol/L (ref 135–145)

## 2020-10-29 LAB — CBC
HCT: 15.1 % — ABNORMAL LOW (ref 36.0–46.0)
Hemoglobin: 4.1 g/dL — CL (ref 12.0–15.0)
MCH: 27.9 pg (ref 26.0–34.0)
MCHC: 27.2 g/dL — ABNORMAL LOW (ref 30.0–36.0)
MCV: 102.7 fL — ABNORMAL HIGH (ref 80.0–100.0)
Platelets: 257 10*3/uL (ref 150–400)
RBC: 1.47 MIL/uL — ABNORMAL LOW (ref 3.87–5.11)
RDW: 17.7 % — ABNORMAL HIGH (ref 11.5–15.5)
WBC: 4.3 10*3/uL (ref 4.0–10.5)
nRBC: 0 % (ref 0.0–0.2)

## 2020-10-29 LAB — PREPARE RBC (CROSSMATCH)

## 2020-10-29 MED ORDER — BICTEGRAVIR-EMTRICITAB-TENOFOV 50-200-25 MG PO TABS
1.0000 | ORAL_TABLET | Freq: Every day | ORAL | Status: DC
  Administered 2020-10-29 – 2020-10-31 (×3): 1 via ORAL
  Filled 2020-10-29 (×3): qty 1

## 2020-10-29 MED ORDER — OMEGA-3-ACID ETHYL ESTERS 1 G PO CAPS
1.0000 g | ORAL_CAPSULE | Freq: Every day | ORAL | Status: DC
  Administered 2020-10-30 – 2020-10-31 (×2): 1 g via ORAL
  Filled 2020-10-29 (×3): qty 1

## 2020-10-29 MED ORDER — ASCORBIC ACID 500 MG PO TABS
1000.0000 mg | ORAL_TABLET | Freq: Every day | ORAL | Status: DC
  Administered 2020-10-30 – 2020-10-31 (×2): 1000 mg via ORAL
  Filled 2020-10-29 (×2): qty 2

## 2020-10-29 MED ORDER — FLUTICASONE FUROATE-VILANTEROL 200-25 MCG/INH IN AEPB
1.0000 | INHALATION_SPRAY | Freq: Every day | RESPIRATORY_TRACT | Status: DC
  Administered 2020-10-31: 1 via RESPIRATORY_TRACT
  Filled 2020-10-29 (×2): qty 28

## 2020-10-29 MED ORDER — ATORVASTATIN CALCIUM 40 MG PO TABS
40.0000 mg | ORAL_TABLET | Freq: Every day | ORAL | Status: DC
  Administered 2020-10-29 – 2020-10-30 (×2): 40 mg via ORAL
  Filled 2020-10-29 (×2): qty 1

## 2020-10-29 MED ORDER — LORATADINE 10 MG PO TABS: 10.0000 mg | ORAL_TABLET | Freq: Every day | ORAL | Status: DC

## 2020-10-29 MED ORDER — ESCITALOPRAM OXALATE 10 MG PO TABS
10.0000 mg | ORAL_TABLET | Freq: Every day | ORAL | Status: DC
  Administered 2020-10-30 – 2020-10-31 (×2): 10 mg via ORAL
  Filled 2020-10-29 (×2): qty 1

## 2020-10-29 MED ORDER — ACYCLOVIR 400 MG PO TABS
400.0000 mg | ORAL_TABLET | Freq: Every day | ORAL | Status: DC | PRN
  Filled 2020-10-29: qty 1

## 2020-10-29 MED ORDER — BOOST / RESOURCE BREEZE PO LIQD CUSTOM
1.0000 | Freq: Three times a day (TID) | ORAL | Status: DC
  Administered 2020-10-30 – 2020-10-31 (×3): 1 via ORAL

## 2020-10-29 MED ORDER — PANTOPRAZOLE SODIUM 40 MG IV SOLR
40.0000 mg | Freq: Once | INTRAVENOUS | Status: AC
  Administered 2020-10-29: 40 mg via INTRAVENOUS
  Filled 2020-10-29: qty 40

## 2020-10-29 MED ORDER — SODIUM CHLORIDE 0.9 % IV BOLUS
500.0000 mL | Freq: Once | INTRAVENOUS | Status: AC
  Administered 2020-10-29: 500 mL via INTRAVENOUS

## 2020-10-29 MED ORDER — VITAMIN A 3 MG (10000 UNIT) PO CAPS
10000.0000 [IU] | ORAL_CAPSULE | Freq: Every day | ORAL | Status: DC
  Administered 2020-10-30 – 2020-10-31 (×2): 10000 [IU] via ORAL
  Filled 2020-10-29 (×2): qty 1

## 2020-10-29 MED ORDER — MECLIZINE HCL 25 MG PO TABS: 25.0000 mg | ORAL_TABLET | Freq: Three times a day (TID) | ORAL | Status: DC | PRN

## 2020-10-29 MED ORDER — HYDRALAZINE HCL 25 MG PO TABS: 25.0000 mg | ORAL_TABLET | Freq: Four times a day (QID) | ORAL | Status: DC | PRN

## 2020-10-29 MED ORDER — FERROUS SULFATE 325 (65 FE) MG PO TABS
325.0000 mg | ORAL_TABLET | Freq: Every day | ORAL | Status: DC
  Administered 2020-10-29 – 2020-10-31 (×3): 325 mg via ORAL
  Filled 2020-10-29 (×3): qty 1

## 2020-10-29 MED ORDER — ALBUTEROL SULFATE HFA 108 (90 BASE) MCG/ACT IN AERS
2.0000 | INHALATION_SPRAY | RESPIRATORY_TRACT | Status: DC | PRN
  Filled 2020-10-29: qty 6.7

## 2020-10-29 MED ORDER — PANTOPRAZOLE SODIUM 40 MG IV SOLR
40.0000 mg | Freq: Two times a day (BID) | INTRAVENOUS | Status: DC
  Administered 2020-10-29 – 2020-10-31 (×4): 40 mg via INTRAVENOUS
  Filled 2020-10-29 (×5): qty 40

## 2020-10-29 MED ORDER — SODIUM CHLORIDE 0.9% IV SOLUTION: Freq: Once | INTRAVENOUS | Status: AC

## 2020-10-29 MED ORDER — VITAMIN D 25 MCG (1000 UNIT) PO TABS
1000.0000 [IU] | ORAL_TABLET | Freq: Every day | ORAL | Status: DC
  Administered 2020-10-30 – 2020-10-31 (×2): 1000 [IU] via ORAL
  Filled 2020-10-29 (×2): qty 1

## 2020-10-29 MED ORDER — VITAMIN B-12 1000 MCG PO TABS
1000.0000 ug | ORAL_TABLET | Freq: Every day | ORAL | Status: DC
  Administered 2020-10-30 – 2020-10-31 (×2): 1000 ug via ORAL
  Filled 2020-10-29 (×2): qty 1

## 2020-10-29 MED ORDER — DIAZEPAM 2 MG PO TABS: 2.0000 mg | ORAL_TABLET | Freq: Four times a day (QID) | ORAL | Status: DC | PRN

## 2020-10-29 MED ORDER — DEXTROMETHORPHAN POLISTIREX ER 30 MG/5ML PO SUER: 30.0000 mg | ORAL | Status: DC | PRN

## 2020-10-29 MED ORDER — TRIAMCINOLONE ACETONIDE 0.1 % EX CREA: 1.0000 "application " | TOPICAL_CREAM | Freq: Two times a day (BID) | CUTANEOUS | Status: DC | PRN

## 2020-10-29 MED ORDER — FLAX SEED OIL 1000 MG PO CAPS: 1000.0000 mg | ORAL_CAPSULE | Freq: Every day | ORAL | Status: DC

## 2020-10-29 MED ORDER — GABAPENTIN 100 MG PO CAPS
100.0000 mg | ORAL_CAPSULE | Freq: Three times a day (TID) | ORAL | Status: DC
  Administered 2020-10-29 – 2020-10-31 (×6): 100 mg via ORAL
  Filled 2020-10-29 (×6): qty 1

## 2020-10-29 NOTE — ED Notes (Signed)
Pt c/o "weakness all over" for approx 2 weeks. Denies any pain or shortness of breath "like last time"

## 2020-10-29 NOTE — H&P (Signed)
History and Physical    Nicole Mccormick JJK:[id number] DOB: [birth year] DOA: 10/29/2020  PCP: Sonia Side., FNP (Confirm with patient/family/NH records and if not entered, this has to be entered at [facility] point of entry) Patient coming from: Home  I have personally briefly reviewed patient's old medical records in [place]  Chief Complaint: Feeling tired.  HPI: Nicole Mccormick is a 74 y.o. 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  Patient was hospitalized since September this year for GI bleed.  Endoscopy showed a duodenal AVM, colonoscopy showed hemorrhoids polyps, furthermore, patient underwent capsule endoscopy inpatient and the findings showed 3 lesions of millimeters in proximal and mid intestine.  Patient received PRBC on same admission.  Patient discharged home however she never follow-up with GI for had a repeated CBC until today.  Patient started to feel low energy, generalized weakness and exertional dyspnea since last week gradually getting worse, she denied any abdominal pain chest pain, did not notice any blood in her stool or dark-colored stool or melena. ED Course: Hemoglobin 4.3 compared to >8, 2 months ago.  Review of Systems: As per HPI otherwise 14 point review of systems negative.    Past Medical History:  Diagnosis Date  . Abnormal Pap smear   . Aortic stenosis   . Arthritis   . Asthma   . AVM (arteriovenous malformation)   . Bradycardia   . Complication of anesthesia    "they have a hard time bringing me back" (11/08/2015)  . Coronary artery disease    a. diffuse CAD of RCA by cath 2016 managed medically.  . Dynamic left ventricular outflow obstruction   . GERD (gastroesophageal reflux disease)    previously on aciphex, discontinued november 2012  because patient  asymptomatic, and concern about interference with HIV meds.  May try pepcid in the future if symptoms return  . Heart murmur   . Hepatitis C antibody test positive   . HIV infection ([place])    diagnosed before 2008  . Hypertension   . Hypertensive heart disease   . Iron deficiency anemia    Ferritin = 2 in november 2012, started on iron supplemenation  . Pacemaker    a. St Jude PPM 01/2018.  . Seizures ([place])   . Symptomatic anemia 08/24/2020  . Varicosities     Past Surgical History:  Procedure Laterality Date  . ABDOMINAL HYSTERECTOMY    . CARDIAC CATHETERIZATION N/A 11/08/2015   Procedure: Left Heart Cath and Coronary Angiography;  Surgeon: Jettie Booze, MD;  Location: [facility] [facility];  Service: Cardiovascular;  Laterality: N/A;  . COLONOSCOPY  10/13/11   small adenoma, anal condyloma  . COLONOSCOPY  10/13/2011   Procedure: COLONOSCOPY;  Surgeon: Gatha Mayer, MD;  Location: [place];  Service: Endoscopy;  Laterality: N/A;  . COLONOSCOPY N/A 09/14/2014   Procedure: COLONOSCOPY;  Surgeon: Arta Silence, MD;  Location: [facility] ENDOSCOPY;  Service: Endoscopy;  Laterality: N/A;  . COLONOSCOPY WITH PROPOFOL N/A 06/05/2020   Procedure: COLONOSCOPY WITH PROPOFOL;  Surgeon: Arta Silence, MD;  Location: [place];  Service: Endoscopy;  Laterality: N/A;  . ESOPHAGOGASTRODUODENOSCOPY  10/13/11   small hiatal hernia  . ESOPHAGOGASTRODUODENOSCOPY  10/13/2011   Procedure: ESOPHAGOGASTRODUODENOSCOPY (EGD);  Surgeon: Gatha Mayer, MD;  Location: [facility] ENDOSCOPY;  Service: Endoscopy;  Laterality: N/A;  . ESOPHAGOGASTRODUODENOSCOPY  N/A 09/14/2014   Procedure: ESOPHAGOGASTRODUODENOSCOPY (EGD);  Surgeon: Arta Silence, MD;  Location: [facility];  Service: Endoscopy;  Laterality: N/A;  . ESOPHAGOGASTRODUODENOSCOPY (EGD) WITH PROPOFOL N/A 06/05/2020   Procedure: ESOPHAGOGASTRODUODENOSCOPY (EGD) WITH PROPOFOL;  Surgeon: Arta Silence, MD;  Location: [place];  Service: Endoscopy;   Laterality: N/A;  . GIVENS CAPSULE STUDY N/A 09/14/2014   Procedure: GIVENS CAPSULE STUDY;  Surgeon: Arta Silence, MD;  Location: [facility] ENDOSCOPY;  Service: Endoscopy;  Laterality: N/A;  . GIVENS CAPSULE STUDY N/A 08/26/2020   Procedure: GIVENS CAPSULE STUDY;  Surgeon: Wonda Horner, MD;  Location: [facility] ENDOSCOPY;  Service: Endoscopy;  Laterality: N/A;  . PACEMAKER IMPLANT N/A 02/05/2018   Procedure: PACEMAKER IMPLANT;  Surgeon: Thompson Grayer, MD;  Location: [place] [facility];  Service: Cardiovascular;  Laterality: N/A;  . POLYPECTOMY  06/05/2020   Procedure: POLYPECTOMY;  Surgeon: Arta Silence, MD;  Location: [facility] ENDOSCOPY;  Service: Endoscopy;;     reports that she has been smoking cigarettes. She has a 60.00 pack-year smoking history. She has never used smokeless tobacco. She reports current alcohol use of about 10.0 standard drinks of alcohol per week. She reports that she does not use drugs.  Allergies  Allergen Reactions  . Penicillins     REACTION: GOES INTO SHOCK & THEN PASSES OU Has patient had a PCN reaction causing immediate rash, facial/tongue/throat swelling, SOB or lightheadedness with hypotension: NO Has patient had a PCN reaction causing severe rash involving mucus membranes or skin necrosis: NO Has patient had a PCN reaction that required hospitalization NO Has patient had a PCN reaction occurring within the last 10 years: NO If all of the above answers are "NO", then may proceed with Cephalosporin use.   . Avelox [Moxifloxacin Hcl In Nacl] Itching and Rash    Family History  Problem Relation Age of Onset  . Diabetes Mother   . Ovarian cancer Sister   . Colon cancer Sister      Prior to Admission medications   Medication Sig Start Date End Date Taking? Authorizing Provider  acyclovir (ZOVIRAX) 400 MG tablet Take 1 tablet (400 mg total) by mouth daily as needed (for flare ups). 07/06/18   Campbell Riches, MD  albuterol (PROAIR HFA) 108 (90 Base) MCG/ACT inhaler  INHALE 2 PUFFS BY MOUTH EVERY 4 HOURS IF NEEDED FOR WHEEZING OR SHORTNESS OF BREATH Patient taking differently: Inhale 2 puffs into the lungs every 4 (four) hours as needed for wheezing or shortness of breath.  01/06/19   Campbell Riches, MD  amLODipine (NORVASC) 10 MG tablet TAKE 1 TABLET BY MOUTH EVERY DAY Patient taking differently: Take 10 mg by mouth daily.  10/05/18   Campbell Riches, MD  Ascorbic Acid (VITAMIN C) 1000 MG tablet Take 1,000 mg by mouth daily.    [provider]  aspirin 81 MG EC tablet Take 81 mg by mouth daily. 07/27/20   [provider]  atorvastatin (LIPITOR) 40 MG tablet take 1 tablet by mouth once daily AT 6 PM Patient taking differently: Take 40 mg by mouth daily. take 1 tablet by mouth once daily AT 6 PM 12/16/17   Dixon, Melton Krebs, NP  BIKTARVY 50-200-25 MG TABS tablet TAKE 1 TABLET BY MOUTH EVERY DAY Patient taking differently: Take 1 tablet by mouth daily.  04/05/19   Campbell Riches, MD  carvedilol (COREG) 3.125 MG tablet TAKE 1 TABLET(3.125 MG) BY MOUTH TWICE DAILY Patient taking differently: Take 3.125 mg by mouth 2 (two) times  daily with a meal.  03/23/20   Allred, Jeneen Rinks, MD  Cetirizine HCl (ZYRTEC ALLERGY) 10 MG TBDP Take 1 tablet by mouth daily. Patient taking differently: Take 10 mg by mouth as needed (allergies).  05/20/17   Campbell Riches, MD  cholecalciferol (VITAMIN D) 1000 units tablet Take 1,000 Units by mouth daily.    [provider]  dextromethorphan (DELSYM) 30 MG/5ML liquid Take 30 mg by mouth as needed for cough.    [provider]  diazepam (VALIUM) 2 MG tablet take 1 tablet by mouth every 6 hours if needed for anxiety Patient taking differently: Take 2 mg by mouth as needed for anxiety.  07/21/15   Shela Leff, MD  escitalopram (LEXAPRO) 10 MG tablet Take 10 mg by mouth daily as needed (anxiety).  12/23/18   [provider]  ferrous sulfate 325 (65 FE) MG EC tablet Take 1 tablet (325 mg  total) by mouth in the morning and at bedtime. 08/26/20 11/24/20  [place] Eric J, DO  Flaxseed, Linseed, (FLAX SEED OIL) 1000 MG CAPS Take 1,000 mg by mouth daily.    [provider]  gabapentin (NEURONTIN) 100 MG capsule Take 100 mg by mouth 3 (three) times daily.    [provider]  hydrochlorothiazide (HYDRODIURIL) 25 MG tablet Take 25 mg by mouth daily.  03/30/20   [provider]  isosorbide mononitrate (IMDUR) 60 MG 24 hr tablet Take 1 tablet (60 mg total) by mouth daily. 07/06/18   Campbell Riches, MD  lisinopril (ZESTRIL) 40 MG tablet Take 40 mg by mouth daily. 05/11/20   [provider]  meclizine (ANTIVERT) 25 MG tablet Take 25 mg by mouth as needed for dizziness.  08/03/20   [provider]  Omega-3 Fatty Acids (FISH OIL) 1000 MG CPDR Take 1,000 mg by mouth daily.    [provider]  pantoprazole (PROTONIX) 40 MG tablet Take 1 tablet (40 mg total) by mouth daily. 12/16/17    Callas, NP  spironolactone (ALDACTONE) 25 MG tablet TAKE 1 TABLET BY MOUTH EVERY DAY Patient taking differently: Take 25 mg by mouth daily.  10/05/18   Campbell Riches, MD  SYMBICORT 160-4.5 MCG/ACT inhaler Inhale 2 puffs into the lungs 2 (two) times daily. 07/27/20   [provider]  triamcinolone cream (KENALOG) 0.1 % Apply 1 application topically 2 (two) times daily. Patient taking differently: Apply 1 application topically 2 (two) times daily as needed (rash, itching).  08/13/17   Tasia Catchings, Amy V, PA-C  vitamin A 10000 UNIT capsule Take 10,000 Units by mouth daily.    [provider]  vitamin B-12 (CYANOCOBALAMIN) 1000 MCG tablet Take 1,000 mcg by mouth daily.    [provider]    Physical Exam: Vitals:   10/29/20 1400 10/29/20 1402 10/29/20 1412 10/29/20 1415  BP: (!) 80/55 (!) 80/55 (!) 84/52   Pulse: 63 71 64 63  Resp: 17 18 (!) 21 18  Temp:  98.6 F (37 C) 98.7 F (37.1 C)   TempSrc:  Oral Oral   SpO2: 99% 100% 100%  100%    Constitutional: NAD, calm, comfortable Vitals:   10/29/20 1400 10/29/20 1402 10/29/20 1412 10/29/20 1415  BP: (!) 80/55 (!) 80/55 (!) 84/52   Pulse: 63 71 64 63  Resp: 17 18 (!) 21 18  Temp:  98.6 F (37 C) 98.7 F (37.1 C)   TempSrc:  Oral Oral   SpO2: 99% 100% 100% 100%   Eyes: PERRL, conjunctivae  pale ENMT: Mucous membranes are moist. Posterior pharynx clear of any exudate or lesions.Normal dentition.  Neck: normal, supple, no masses, no thyromegaly Respiratory: clear to auscultation bilaterally, no wheezing, no crackles. Normal respiratory effort. No accessory muscle use.  Cardiovascular: Regular rate and rhythm, no murmurs / rubs / gallops. No extremity edema. 2+ pedal pulses. No carotid bruits.  Abdomen: no tenderness, no masses palpated. No hepatosplenomegaly. Bowel sounds positive.  Musculoskeletal: no clubbing / cyanosis. No joint deformity upper and lower extremities. Good ROM, no contractures. Normal muscle tone.  Skin: no rashes, lesions, ulcers. No induration Neurologic: CN 2-12 grossly intact. Sensation intact, DTR normal. Strength 5/5 in all 4.  Psychiatric: Normal judgment and insight. Alert and oriented x 3. Normal mood.    Labs on Admission: I have personally reviewed following labs and imaging studies  CBC: Recent Labs  Lab 10/29/20 1254  WBC 4.3  HGB 4.1*  HCT 15.1*  MCV 102.7*  PLT 785   Basic Metabolic Panel: Recent Labs  Lab 10/29/20 1254  NA 139  K 4.0  CL 107  CO2 24  GLUCOSE 115*  BUN 21  CREATININE 1.27*  CALCIUM 8.6*   GFR: CrCl cannot be calculated (Unknown ideal weight.). Liver Function Tests: No results for input(s): AST, ALT, ALKPHOS, BILITOT, PROT, ALBUMIN in the last 168 hours. No results for input(s): LIPASE, AMYLASE in the last 168 hours. No results for input(s): AMMONIA in the last 168 hours. Coagulation Profile: No results for input(s): INR, PROTIME in the last 168 hours. Cardiac Enzymes: No results for  input(s): CKTOTAL, CKMB, CKMBINDEX, TROPONINI in the last 168 hours. BNP (last 3 results) No results for input(s): PROBNP in the last 8760 hours. HbA1C: No results for input(s): HGBA1C in the last 72 hours. CBG: Recent Labs  Lab 10/29/20 1329  GLUCAP 99   Lipid Profile: No results for input(s): CHOL, HDL, LDLCALC, TRIG, CHOLHDL, LDLDIRECT in the last 72 hours. Thyroid Function Tests: No results for input(s): TSH, T4TOTAL, FREET4, T3FREE, THYROIDAB in the last 72 hours. Anemia Panel: No results for input(s): VITAMINB12, FOLATE, FERRITIN, TIBC, IRON, RETICCTPCT in the last 72 hours. Urine analysis:    Component Value Date/Time   COLORURINE YELLOW 06/03/2020 1618   APPEARANCEUR CLEAR 06/03/2020 1618   LABSPEC 1.025 06/03/2020 1618   PHURINE 5.5 06/03/2020 1618   GLUCOSEU NEGATIVE 06/03/2020 1618   HGBUR TRACE (A) 06/03/2020 1618   BILIRUBINUR NEGATIVE 06/03/2020 1618   KETONESUR NEGATIVE 06/03/2020 1618   PROTEINUR 30 (A) 06/03/2020 1618   UROBILINOGEN 0.2 02/12/2015 1030   NITRITE NEGATIVE 06/03/2020 1618   LEUKOCYTESUR NEGATIVE 06/03/2020 1618    Radiological Exams on Admission: No results found.  EKG: Independently reviewed.  Atrial paced Assessment/Plan Active Problems:   GI bleed  (please populate well all problems here in Problem List. (For example, if patient is on BP meds at home and you resume or decide to hold them, it is a problem that needs to be her. Same for CAD, COPD, HLD and so on)  Acute on chronic iron deficiency secondary to intestinal AVM -Likely slow bleeding issue, reconsult [facility], Dr. Watt Climes will see patient today. -Hold aspirin for today until H&H is stabilized -PRBC x2, recheck H&H tonight and tomorrow, aiming at hemoglobin 7.0 -Double dose PPI for now -Hold BP meds  Hemorrhagic shock -Secondary to worsening of anemia, responded to IV fluids boluses -PRBC and reevaluate -Hold all BP meds -Orthostatic vitals starting tomorrow.  CAD -Hold  aspirin  HTN -Hold all  BP meds  HIV -Continue HAART  DVT prophylaxis: SCD Code Status: Full code Family Communication: Daughter at bedside Disposition Plan: Severe anemia and hemorrhagic shock need blood transfusion, expect more than 2 midnight hospital stay for GI work-up Consults called: [facility] Admission status: Telemetry admission   Lequita Halt MD [facility] Pager [phone]  10/29/2020, 2:30 PM

## 2020-10-29 NOTE — Plan of Care (Signed)
  Problem: Education: Goal: Knowledge of General Education information will improve Description: Including pain rating scale, medication(s)/side effects and non-pharmacologic comfort measures Outcome: Progressing   Problem: Clinical Measurements: Goal: Respiratory complications will improve Outcome: Progressing   Problem: Clinical Measurements: Goal: Cardiovascular complication will be avoided Outcome: Progressing   Problem: Nutrition: Goal: Adequate nutrition will be maintained Outcome: Progressing   Problem: Coping: Goal: Level of anxiety will decrease Outcome: Progressing   Problem: Pain Managment: Goal: General experience of comfort will improve Outcome: Progressing

## 2020-10-29 NOTE — ED Triage Notes (Signed)
Pt arrives to ED with chief complaint of shortness of breath and weakness, hx of anemia. She states around 1-2 months ago required 2 units of blood.  BP 73/43

## 2020-10-29 NOTE — ED Provider Notes (Signed)
[facility] EMERGENCY DEPARTMENT Provider Note   CSN: [id number] Arrival date & time: 10/29/20  1239     History Chief Complaint  Patient presents with  . Weakness  . Shortness of Breath    Nicole Mccormick is a 74 y.o. female.  HPI   This patient is a 74 year old female with a known history of aortic stenosis, she has a history of sick sinus syndrome with an ICD pacemaker, she has medically managed coronary disease of her right coronary artery, known history of HIV infection for the last 15 years, hypertension and a history of multiple arteriovenous malformations that have been seen on capsule endoscopy and gastrointestinal work-up in the past.  The patient previously been taking aspirin, iron supplements but was admitted to the hospital in September because of symptomatic anemia.  She had a gastrointestinal work-up at that time with a capsule endoscopy showing small bowel AVMs.  They were not actively bleeding, she has been managed medically.  She presents to the hospital today because of increasing generalized weakness and shortness of breath, states that she has trouble even walking across the room because she is so weak, this is generalized, it is not focal, it is not associated with vomiting or bloody bowel movements, she has no dysuria fevers chills nausea vomiting or diarrhea.  She denies pain in the chest, denies coughing, denies abdominal pain, she does endorse shortness of breath with the weakness.  Past Medical History:  Diagnosis Date  . Abnormal Pap smear   . Aortic stenosis   . Arthritis   . Asthma   . AVM (arteriovenous malformation)   . Bradycardia   . Complication of anesthesia    "they have a hard time bringing me back" (11/08/2015)  . Coronary artery disease    a. diffuse CAD of RCA by cath 2016 managed medically.  . Dynamic left ventricular outflow obstruction   . GERD (gastroesophageal reflux disease)    previously on aciphex, discontinued  november 2012  because patient asymptomatic, and concern about interference with HIV meds.  May try pepcid in the future if symptoms return  . Heart murmur   . Hepatitis C antibody test positive   . HIV infection ([place])    diagnosed before 2008  . Hypertension   . Hypertensive heart disease   . Iron deficiency anemia    Ferritin = 2 in november 2012, started on iron supplemenation  . Pacemaker    a. St Jude PPM 01/2018.  . Seizures ([place])   . Symptomatic anemia 08/24/2020  . Varicosities     Patient Active Problem List   Diagnosis Date Noted  . AVM (arteriovenous malformation) of stomach, acquired 08/26/2020  . COPD (chronic obstructive pulmonary disease) ([place]) 08/24/2020  . Arthritis 08/23/2020  . Dyspnea   . Shortness of breath   . Symptomatic anemia 06/03/2020  . Chronic arthralgias of knees and hips 07/06/2018  . Sick sinus syndrome ([place]) 02/28/2016  . Hypertensive cardiovascular disease 02/28/2016  . Bilateral carotid bruits 02/12/2016  . LVH (left ventricular hypertrophy) 01/15/2016  . Anxiety 07/22/2015  . Seasonal allergies 07/22/2015  . Asthma, chronic 07/22/2015  . Health care maintenance 02/17/2015  . Hepatitis C 08/07/2014  . Back pain 10/28/2012  . LGSIL Pap smear of vagina 01/16/2012  . Anal condylomata 11/18/2011  . CAD (coronary artery disease) 08/07/2011  . Essential hypertension, benign 12/25/2010  . Human immunodeficiency virus (HIV) disease ([place]) 01/23/2007  . TOBACCO ABUSE 01/23/2007  . CATARACT NEC 01/23/2007  .  Gastroesophageal reflux disease 01/23/2007    Past Surgical History:  Procedure Laterality Date  . ABDOMINAL HYSTERECTOMY    . CARDIAC CATHETERIZATION N/A 11/08/2015   Procedure: Left Heart Cath and Coronary Angiography;  Surgeon: Jettie Booze, MD;  Location: [facility];  Service: Cardiovascular;  Laterality: N/A;  . COLONOSCOPY  10/13/11   small adenoma, anal condyloma  . COLONOSCOPY  10/13/2011   Procedure: COLONOSCOPY;   Surgeon: Gatha Mayer, MD;  Location: [facility];  Service: Endoscopy;  Laterality: N/A;  . COLONOSCOPY N/A 09/14/2014   Procedure: COLONOSCOPY;  Surgeon: Arta Silence, MD;  Location: [facility] ENDOSCOPY;  Service: Endoscopy;  Laterality: N/A;  . COLONOSCOPY WITH PROPOFOL N/A 06/05/2020   Procedure: COLONOSCOPY WITH PROPOFOL;  Surgeon: Arta Silence, MD;  Location: [facility];  Service: Endoscopy;  Laterality: N/A;  . ESOPHAGOGASTRODUODENOSCOPY  10/13/11   small hiatal hernia  . ESOPHAGOGASTRODUODENOSCOPY  10/13/2011   Procedure: ESOPHAGOGASTRODUODENOSCOPY (EGD);  Surgeon: Gatha Mayer, MD;  Location: [facility] ENDOSCOPY;  Service: Endoscopy;  Laterality: N/A;  . ESOPHAGOGASTRODUODENOSCOPY N/A 09/14/2014   Procedure: ESOPHAGOGASTRODUODENOSCOPY (EGD);  Surgeon: Arta Silence, MD;  Location: [facility] ENDOSCOPY;  Service: Endoscopy;  Laterality: N/A;  . ESOPHAGOGASTRODUODENOSCOPY (EGD) WITH PROPOFOL N/A 06/05/2020   Procedure: ESOPHAGOGASTRODUODENOSCOPY (EGD) WITH PROPOFOL;  Surgeon: Arta Silence, MD;  Location: [facility];  Service: Endoscopy;  Laterality: N/A;  . GIVENS CAPSULE STUDY N/A 09/14/2014   Procedure: GIVENS CAPSULE STUDY;  Surgeon: Arta Silence, MD;  Location[facility] ENDOSCOPY;  Service: Endoscopy;  Laterality: N/A;  . GIVENS CAPSULE STUDY N/A 08/26/2020   Procedure: GIVENS CAPSULE STUDY;  Surgeon: Wonda Horner, MD;  Location: [facility] ENDOSCOPY;  Service: Endoscopy;  Laterality: N/A;  . PACEMAKER IMPLANT N/A 02/05/2018   Procedure: PACEMAKER IMPLANT;  Surgeon: Thompson Grayer, MD;  Location: [facility];  Service: Cardiovascular;  Laterality: N/A;  . POLYPECTOMY  06/05/2020   Procedure: POLYPECTOMY;  Surgeon: Arta Silence, MD;  Location: [facility] ENDOSCOPY;  Service: Endoscopy;;     OB History    Gravida  6   Para  5   Term      Preterm      AB  1   Living  5     SAB  1   TAB      Ectopic      Multiple      Live Births              Family History  Problem Relation Age  of Onset  . Diabetes Mother   . Ovarian cancer Sister   . Colon cancer Sister     Social History   Tobacco Use  . Smoking status: Current Every Day Smoker    Packs/day: 1.00    Years: 60.00    Pack years: 60.00    Types: Cigarettes  . Smokeless tobacco: Never Used  . Tobacco comment: Wants to quit. Using both the patch and gum.  Vaping Use  . Vaping Use: Never used  Substance Use Topics  . Alcohol use: Yes    Alcohol/week: 10.0 standard drinks    Types: 10 Glasses of wine per week    Comment: drinking less recently, denies h/o heavy use  . Drug use: No    Home Medications Prior to Admission medications   Medication Sig Start Date End Date Taking? Authorizing Provider  acyclovir (ZOVIRAX) 400 MG tablet Take 1 tablet (400 mg total) by mouth daily as needed (for flare ups). 07/06/18   Campbell Riches,  MD  albuterol (PROAIR HFA) 108 (90 Base) MCG/ACT inhaler INHALE 2 PUFFS BY MOUTH EVERY 4 HOURS IF NEEDED FOR WHEEZING OR SHORTNESS OF BREATH Patient taking differently: Inhale 2 puffs into the lungs every 4 (four) hours as needed for wheezing or shortness of breath.  01/06/19   Campbell Riches, MD  amLODipine (NORVASC) 10 MG tablet TAKE 1 TABLET BY MOUTH EVERY DAY Patient taking differently: Take 10 mg by mouth daily.  10/05/18   Campbell Riches, MD  Ascorbic Acid (VITAMIN C) 1000 MG tablet Take 1,000 mg by mouth daily.    [provider]  aspirin 81 MG EC tablet Take 81 mg by mouth daily. 07/27/20   [provider]  atorvastatin (LIPITOR) 40 MG tablet take 1 tablet by mouth once daily AT 6 PM Patient taking differently: Take 40 mg by mouth daily. take 1 tablet by mouth once daily AT 6 PM 12/16/17   Dixon, Melton Krebs, NP  BIKTARVY 50-200-25 MG TABS tablet TAKE 1 TABLET BY MOUTH EVERY DAY Patient taking differently: Take 1 tablet by mouth daily.  04/05/19   Campbell Riches, MD  carvedilol (COREG) 3.125 MG tablet TAKE 1 TABLET(3.125 MG) BY MOUTH TWICE  DAILY Patient taking differently: Take 3.125 mg by mouth 2 (two) times daily with a meal.  03/23/20   Allred, Jeneen Rinks, MD  Cetirizine HCl (ZYRTEC ALLERGY) 10 MG TBDP Take 1 tablet by mouth daily. Patient taking differently: Take 10 mg by mouth as needed (allergies).  05/20/17   Campbell Riches, MD  cholecalciferol (VITAMIN D) 1000 units tablet Take 1,000 Units by mouth daily.    [provider]  dextromethorphan (DELSYM) 30 MG/5ML liquid Take 30 mg by mouth as needed for cough.    [provider]  diazepam (VALIUM) 2 MG tablet take 1 tablet by mouth every 6 hours if needed for anxiety Patient taking differently: Take 2 mg by mouth as needed for anxiety.  07/21/15   Shela Leff, MD  escitalopram (LEXAPRO) 10 MG tablet Take 10 mg by mouth daily as needed (anxiety).  12/23/18   [provider]  ferrous sulfate 325 (65 FE) MG EC tablet Take 1 tablet (325 mg total) by mouth in the morning and at bedtime. 08/26/20 11/24/20  [place] Eric J, DO  Flaxseed, Linseed, (FLAX SEED OIL) 1000 MG CAPS Take 1,000 mg by mouth daily.    [provider]  gabapentin (NEURONTIN) 100 MG capsule Take 100 mg by mouth 3 (three) times daily.    [provider]  hydrochlorothiazide (HYDRODIURIL) 25 MG tablet Take 25 mg by mouth daily.  03/30/20   [provider]  isosorbide mononitrate (IMDUR) 60 MG 24 hr tablet Take 1 tablet (60 mg total) by mouth daily. 07/06/18   Campbell Riches, MD  lisinopril (ZESTRIL) 40 MG tablet Take 40 mg by mouth daily. 05/11/20   [provider]  meclizine (ANTIVERT) 25 MG tablet Take 25 mg by mouth as needed for dizziness.  08/03/20   [provider]  Omega-3 Fatty Acids (FISH OIL) 1000 MG CPDR Take 1,000 mg by mouth daily.    [provider]  pantoprazole (PROTONIX) 40 MG tablet Take 1 tablet (40 mg total) by mouth daily. 12/16/17   [place] Callas, NP  spironolactone (ALDACTONE) 25 MG tablet TAKE 1 TABLET BY MOUTH  EVERY DAY Patient taking differently: Take 25 mg by mouth daily.  10/05/18   Campbell Riches, MD  SYMBICORT 160-4.5 MCG/ACT inhaler Inhale  2 puffs into the lungs 2 (two) times daily. 07/27/20   [provider]  triamcinolone cream (KENALOG) 0.1 % Apply 1 application topically 2 (two) times daily. Patient taking differently: Apply 1 application topically 2 (two) times daily as needed (rash, itching).  08/13/17   Tasia Catchings, Amy V, PA-C  vitamin A 10000 UNIT capsule Take 10,000 Units by mouth daily.    [provider]  vitamin B-12 (CYANOCOBALAMIN) 1000 MCG tablet Take 1,000 mcg by mouth daily.    [provider]    Allergies    Penicillins and Avelox [moxifloxacin hcl in nacl]  Review of Systems   Review of Systems  All other systems reviewed and are negative.   Physical Exam Updated Vital Signs BP (!) 80/55   Pulse 63   Temp 98.6 F (37 C) (Oral)   Resp 17   SpO2 99%   Physical Exam Vitals and nursing note reviewed.  Constitutional:      General: She is not in acute distress.    Appearance: She is well-developed.  HENT:     Head: Normocephalic and atraumatic.     Mouth/Throat:     Pharynx: No oropharyngeal exudate.     Comments: Pale mucous membranes Eyes:     General: No scleral icterus.       Right eye: No discharge.        Left eye: No discharge.     Conjunctiva/sclera: Conjunctivae normal.     Pupils: Pupils are equal, round, and reactive to light.     Comments: Pale conjunctive a  Neck:     Thyroid: No thyromegaly.     Vascular: No JVD.  Cardiovascular:     Rate and Rhythm: Normal rate and regular rhythm.     Heart sounds: Murmur heard.  No friction rub. No gallop.      Comments: Loud systolic murmur Pulmonary:     Effort: Pulmonary effort is normal. No respiratory distress.     Breath sounds: Normal breath sounds. No wheezing or rales.  Abdominal:     General: Bowel sounds are normal. There is no distension.     Palpations: Abdomen is  soft. There is no mass.     Tenderness: There is no abdominal tenderness.  Musculoskeletal:        General: No tenderness. Normal range of motion.     Cervical back: Normal range of motion and neck supple.  Lymphadenopathy:     Cervical: No cervical adenopathy.  Skin:    General: Skin is warm and dry.     Coloration: Skin is pale.     Findings: No erythema or rash.  Neurological:     Mental Status: She is alert.     Coordination: Coordination normal.     Comments: The patient is able to move all 4 extremities, she has no asymmetric strength, she has some generalized weakness but is able to do everything I asked her by herself.  Her speech is clear and there is no facial droop.  Psychiatric:        Behavior: Behavior normal.     ED Results / Procedures / Treatments   Labs (all labs ordered are listed, but only abnormal results are displayed) Labs Reviewed  BASIC METABOLIC PANEL - Abnormal; Notable for the following components:      Result Value   Glucose, Bld 115 (*)    Creatinine, Ser 1.27 (*)    Calcium 8.6 (*)    GFR, Estimated 44 (*)  All other components within normal limits  CBC - Abnormal; Notable for the following components:   RBC 1.47 (*)    Hemoglobin 4.1 (*)    HCT 15.1 (*)    MCV 102.7 (*)    MCHC 27.2 (*)    RDW 17.7 (*)    All other components within normal limits  URINALYSIS, ROUTINE W REFLEX MICROSCOPIC  CBG MONITORING, ED  TYPE AND SCREEN  PREPARE RBC (CROSSMATCH)  TROPONIN I (HIGH SENSITIVITY)    EKG EKG Interpretation  Date/Time:  Sunday October 29 2020 12:45:30 EST Ventricular Rate:  64 PR Interval:  186 QRS Duration: 82 QT Interval:  428 QTC Calculation: 441 R Axis:   -10 Text Interpretation: Atrial-paced rhythm Septal infarct , age undetermined Abnormal ECG since last tracing no significant change Confirmed by Noemi Chapel [phone]) on 10/29/2020 1:04:38 PM   Radiology No results found.  Procedures .Critical Care Performed by:  Noemi Chapel, MD Authorized by: Noemi Chapel, MD   Critical care provider statement:    Critical care time (minutes):  35   Critical care time was exclusive of:  Separately billable procedures and treating other patients and teaching time   Critical care was necessary to treat or prevent imminent or life-threatening deterioration of the following conditions: severe anemia - GI bleed.   Critical care was time spent personally by me on the following activities:  Blood draw for specimens, development of treatment plan with patient or surrogate, discussions with consultants, evaluation of patient's response to treatment, examination of patient, obtaining history from patient or surrogate, ordering and performing treatments and interventions, ordering and review of laboratory studies, ordering and review of radiographic studies, pulse oximetry, re-evaluation of patient's condition and review of old charts   (including critical care time)  Medications Ordered in ED Medications  0.9 %  sodium chloride infusion (Manually program via Guardrails IV Fluids) (has no administration in time range)  pantoprazole (PROTONIX) injection 40 mg (40 mg Intravenous Given 10/29/20 1319)  sodium chloride 0.9 % bolus 500 mL (500 mLs Intravenous New Bag/Given 10/29/20 1323)    ED Course  I have reviewed the triage vital signs and the nursing notes.  Pertinent labs & imaging results that were available during my care of the patient were reviewed by me and considered in my medical decision making (see chart for details).  Clinical Course as of Oct 29 1402  Sun Oct 29, 2020  1316 Hgb reportedly 4.1, will need transfusion and admission for severe anemia - protonix given, IVF bolus given, pt is critically ill   [BM]    Clinical Course User Index [BM] Noemi Chapel, MD   MDM Rules/Calculators/A&P                          The patient's last upper endoscopy and capsule endoscopy were in September 2021, she likely  has had some recurrent anemia, will check electrolytes as well as blood counts urinalysis and an EKG.  The patient is agreeable.  She was initially hypotensive at 73/40 on arrival but without intervention in the room she has 720 systolic.  Hypotension and severe anemia  Labs reviewed, prior visit reviewed, prior endoscopy and capsule endoscopy reviewed.  The patient's blood pressure is currently 77/50, she is receiving an IV fluid bolus for hypotension as well as blood transfusions.  Discussed with the hospitalist who will admit  Final Clinical Impression(s) / ED Diagnoses Final diagnoses:  Severe anemia  Hypotension due  to hypovolemia    Rx / DC Orders ED Discharge Orders    None       Noemi Chapel, MD 10/29/20 [phone]

## 2020-10-29 NOTE — ED Notes (Signed)
Blood pressure cuff changed for better fit.

## 2020-10-29 NOTE — ED Notes (Signed)
Pts BP 80/55. Pt talking on phone. 581ml bolus completed.

## 2020-10-29 NOTE — Progress Notes (Signed)
Patient's previous work-up reviewed and she has been on aspirin in her hospital computer chart in our office computer chart reviewed and she missed her follow-up appointment in the fall and probably ultimately will [facility] but would keep on clear liquids for now and hold aspirin and reevaluate whether she needs aspirin going forward and consider IV iron or erythropoietin as needed and will ask rounding team to formalize consult in a.m. please call sooner if signs of active bleeding

## 2020-10-30 DIAGNOSIS — I2510 Atherosclerotic heart disease of native coronary artery without angina pectoris: Secondary | ICD-10-CM

## 2020-10-30 DIAGNOSIS — I495 Sick sinus syndrome: Secondary | ICD-10-CM

## 2020-10-30 DIAGNOSIS — D649 Anemia, unspecified: Secondary | ICD-10-CM | POA: Diagnosis not present

## 2020-10-30 DIAGNOSIS — I2583 Coronary atherosclerosis due to lipid rich plaque: Secondary | ICD-10-CM

## 2020-10-30 DIAGNOSIS — B182 Chronic viral hepatitis C: Secondary | ICD-10-CM

## 2020-10-30 DIAGNOSIS — K31811 Angiodysplasia of stomach and duodenum with bleeding: Secondary | ICD-10-CM | POA: Diagnosis not present

## 2020-10-30 DIAGNOSIS — D62 Acute posthemorrhagic anemia: Secondary | ICD-10-CM | POA: Diagnosis not present

## 2020-10-30 DIAGNOSIS — K31819 Angiodysplasia of stomach and duodenum without bleeding: Secondary | ICD-10-CM

## 2020-10-30 DIAGNOSIS — K219 Gastro-esophageal reflux disease without esophagitis: Secondary | ICD-10-CM

## 2020-10-30 DIAGNOSIS — B20 Human immunodeficiency virus [HIV] disease: Secondary | ICD-10-CM

## 2020-10-30 DIAGNOSIS — I10 Essential (primary) hypertension: Secondary | ICD-10-CM

## 2020-10-30 LAB — URINALYSIS, ROUTINE W REFLEX MICROSCOPIC
Bacteria, UA: NONE SEEN
Bilirubin Urine: NEGATIVE
Glucose, UA: NEGATIVE mg/dL
Hgb urine dipstick: NEGATIVE
Ketones, ur: NEGATIVE mg/dL
Leukocytes,Ua: NEGATIVE
Nitrite: NEGATIVE
Protein, ur: NEGATIVE mg/dL
Specific Gravity, Urine: 1.014 (ref 1.005–1.030)
pH: 6 (ref 5.0–8.0)

## 2020-10-30 LAB — BPAM RBC
Blood Product Expiration Date: 202112242359
Blood Product Expiration Date: 202112252359
ISSUE DATE / TIME: 202111211417
ISSUE DATE / TIME: 202111212212
Unit Type and Rh: 5100
Unit Type and Rh: 5100

## 2020-10-30 LAB — TYPE AND SCREEN
ABO/RH(D): O POS
Antibody Screen: NEGATIVE
Unit division: 0
Unit division: 0

## 2020-10-30 LAB — HEMOGLOBIN AND HEMATOCRIT, BLOOD
HCT: 24.9 % — ABNORMAL LOW (ref 36.0–46.0)
Hemoglobin: 7.7 g/dL — ABNORMAL LOW (ref 12.0–15.0)

## 2020-10-30 NOTE — Consult Note (Signed)
Referring Provider: [facility] Primary Care Physician:  Sonia Side., FNP Primary Gastroenterologist:  Sadie Haber / Dr. Penelope Coop   Reason for Consultation: Recurrent anemia/recurrent GI bleed  HPI: Nicole Mccormick is a 74 y.o. female with past medical history of recurrent anemia/recurrent GI bleed from history of AVMs, history of coronary disease on aspirin, history of HIV on HAART, history of bradycardia status post pacemaker placement presented to the hospital with fatigue and weakness.  Upon initial evaluation she was found to have hemoglobin of 4.3.  GI is consulted for further evaluation.  Patient seen and examined at bedside.  She is denying any GI symptoms.  Denies seeing any blood in the stool or black stool.  Previous GI work-up Capsule endoscopy in September 2021 showed 3 AVMs scattered throughout the small bowel with possible AVM in the proximal small bowel.  EGD in June 2021 showed small antral hernia and one small nonbleeding AVM in the duodenum.  Colonoscopy in June 2021 showed 2 small polyps in the sigmoid colon and internal hemorrhoids.  No evidence of active bleeding.  Pathology showed tubular adenomas.  EGD in about 2015 showed small AVMs in the duodenum but no significant bleeding. Colonoscopy in October 2015 showed condylomata not otherwise no polyps or bleeding seen. Capsule endoscopy in October 2015 showed multiple AVMs in the proximal small bowel.  Past Medical History:  Diagnosis Date  . Abnormal Pap smear   . Aortic stenosis   . Arthritis   . Asthma   . AVM (arteriovenous malformation)   . Bradycardia   . Complication of anesthesia    "they have a hard time bringing me back" (11/08/2015)  . Coronary artery disease    a. diffuse CAD of RCA by cath 2016 managed medically.  . Dynamic left ventricular outflow obstruction   . GERD (gastroesophageal reflux disease)    previously on aciphex, discontinued november 2012  because patient asymptomatic, and concern about interference  with HIV meds.  May try pepcid in the future if symptoms return  . Heart murmur   . Hepatitis C antibody test positive   . HIV infection ([place])    diagnosed before 2008  . Hypertension   . Hypertensive heart disease   . Iron deficiency anemia    Ferritin = 2 in november 2012, started on iron supplemenation  . Pacemaker    a. St Jude PPM 01/2018.  . Seizures ([place])   . Symptomatic anemia 08/24/2020  . Varicosities     Past Surgical History:  Procedure Laterality Date  . ABDOMINAL HYSTERECTOMY    . CARDIAC CATHETERIZATION N/A 11/08/2015   Procedure: Left Heart Cath and Coronary Angiography;  Surgeon: Jettie Booze, MD;  Location: [place] CV LAB;  Service: Cardiovascular;  Laterality: N/A;  . COLONOSCOPY  10/13/11   small adenoma, anal condyloma  . COLONOSCOPY  10/13/2011   Procedure: COLONOSCOPY;  Surgeon: Gatha Mayer, MD;  Location: [place];  Service: Endoscopy;  Laterality: N/A;  . COLONOSCOPY N/A 09/14/2014   Procedure: COLONOSCOPY;  Surgeon: Arta Silence, MD;  Location: [facility] ENDOSCOPY;  Service: Endoscopy;  Laterality: N/A;  . COLONOSCOPY WITH PROPOFOL N/A 06/05/2020   Procedure: COLONOSCOPY WITH PROPOFOL;  Surgeon: Arta Silence, MD;  Location: [place];  Service: Endoscopy;  Laterality: N/A;  . ESOPHAGOGASTRODUODENOSCOPY  10/13/11   small hiatal hernia  . ESOPHAGOGASTRODUODENOSCOPY  10/13/2011   Procedure: ESOPHAGOGASTRODUODENOSCOPY (EGD);  Surgeon: Gatha Mayer, MD;  Location: [facility] ENDOSCOPY;  Service: Endoscopy;  Laterality: N/A;  . ESOPHAGOGASTRODUODENOSCOPY N/A  09/14/2014   Procedure: ESOPHAGOGASTRODUODENOSCOPY (EGD);  Surgeon: Arta Silence, MD;  Location: [facility] ENDOSCOPY;  Service: Endoscopy;  Laterality: N/A;  . ESOPHAGOGASTRODUODENOSCOPY (EGD) WITH PROPOFOL N/A 06/05/2020   Procedure: ESOPHAGOGASTRODUODENOSCOPY (EGD) WITH PROPOFOL;  Surgeon: Arta Silence, MD;  Location: [place];  Service: Endoscopy;  Laterality: N/A;  . GIVENS CAPSULE STUDY N/A  09/14/2014   Procedure: GIVENS CAPSULE STUDY;  Surgeon: Arta Silence, MD;  Location: [facility] ENDOSCOPY;  Service: Endoscopy;  Laterality: N/A;  . GIVENS CAPSULE STUDY N/A 08/26/2020   Procedure: GIVENS CAPSULE STUDY;  Surgeon: Wonda Horner, MD;  Location: [facility] ENDOSCOPY;  Service: Endoscopy;  Laterality: N/A;  . PACEMAKER IMPLANT N/A 02/05/2018   Procedure: PACEMAKER IMPLANT;  Surgeon: Thompson Grayer, MD;  Location: [place] CV LAB;  Service: Cardiovascular;  Laterality: N/A;  . POLYPECTOMY  06/05/2020   Procedure: POLYPECTOMY;  Surgeon: Arta Silence, MD;  Location: [facility] ENDOSCOPY;  Service: Endoscopy;;    Prior to Admission medications   Medication Sig Start Date End Date Taking? Authorizing Provider  acyclovir (ZOVIRAX) 400 MG tablet Take 1 tablet (400 mg total) by mouth daily as needed (for flare ups). 07/06/18  Yes Campbell Riches, MD  albuterol (PROAIR HFA) 108 (90 Base) MCG/ACT inhaler INHALE 2 PUFFS BY MOUTH EVERY 4 HOURS IF NEEDED FOR WHEEZING OR SHORTNESS OF BREATH Patient taking differently: Inhale 2 puffs into the lungs every 4 (four) hours as needed for wheezing or shortness of breath.  01/06/19  Yes Campbell Riches, MD  ALPRAZolam Duanne Moron) 0.5 MG tablet Take 0.5 mg by mouth 3 (three) times daily as needed for anxiety. 10/04/20  Yes [provider]  amLODipine (NORVASC) 10 MG tablet TAKE 1 TABLET BY MOUTH EVERY DAY Patient taking differently: Take 10 mg by mouth daily.  10/05/18  Yes Campbell Riches, MD  Ascorbic Acid (VITAMIN C) 1000 MG tablet Take 1,000 mg by mouth daily.   Yes [provider]  aspirin 81 MG EC tablet Take 81 mg by mouth daily. 07/27/20  Yes [provider]  atorvastatin (LIPITOR) 40 MG tablet take 1 tablet by mouth once daily AT 6 PM Patient taking differently: Take 40 mg by mouth every evening.  12/16/17  Yes Dixon, Melton Krebs, NP  BIKTARVY 50-200-25 MG TABS tablet TAKE 1 TABLET BY MOUTH EVERY DAY Patient taking differently: Take 1  tablet by mouth daily.  04/05/19  Yes Campbell Riches, MD  carvedilol (COREG) 3.125 MG tablet TAKE 1 TABLET(3.125 MG) BY MOUTH TWICE DAILY Patient taking differently: Take 3.125 mg by mouth 2 (two) times daily with a meal.  03/23/20  Yes Allred, Jeneen Rinks, MD  cetirizine (ZYRTEC) 10 MG tablet Take 10 mg by mouth daily. 10/09/20  Yes [provider]  cholecalciferol (VITAMIN D) 1000 units tablet Take 1,000 Units by mouth daily.   Yes [provider]  dextromethorphan (DELSYM) 30 MG/5ML liquid Take 30 mg by mouth as needed for cough.    Yes [provider]  diazepam (VALIUM) 2 MG tablet take 1 tablet by mouth every 6 hours if needed for anxiety Patient taking differently: Take 2 mg by mouth as needed for anxiety.  07/21/15  Yes Shela Leff, MD  escitalopram (LEXAPRO) 10 MG tablet Take 10 mg by mouth daily as needed (anxiety).  12/23/18  Yes [provider]  famotidine (PEPCID) 20 MG tablet Take 20 mg by mouth 2 (two) times daily. 10/09/20  Yes [provider]  ferrous sulfate 325 (65 FE) MG EC tablet Take  1 tablet (325 mg total) by mouth in the morning and at bedtime. 08/26/20 11/24/20 Yes [place] Eric J, DO  Flaxseed, Linseed, (FLAX SEED OIL) 1000 MG CAPS Take 1,000 mg by mouth daily.   Yes [provider]  gabapentin (NEURONTIN) 100 MG capsule Take 100 mg by mouth 3 (three) times daily.   Yes [provider]  hydrochlorothiazide (HYDRODIURIL) 25 MG tablet Take 25 mg by mouth daily.  03/30/20  Yes [provider]  isosorbide mononitrate (IMDUR) 60 MG 24 hr tablet Take 1 tablet (60 mg total) by mouth daily. 07/06/18  Yes Campbell Riches, MD  lisinopril (ZESTRIL) 40 MG tablet Take 40 mg by mouth daily. 05/11/20  Yes [provider]  meclizine (ANTIVERT) 25 MG tablet Take 25 mg by mouth as needed for dizziness.  08/03/20  Yes [provider]  Omega-3 Fatty Acids (FISH OIL) 1000 MG CPDR Take 1,000 mg by mouth daily.    Yes [provider]  pantoprazole (PROTONIX) 40 MG tablet Take 1 tablet (40 mg total) by mouth daily. 12/16/17  Yes Donovan Callas, NP  spironolactone (ALDACTONE) 25 MG tablet TAKE 1 TABLET BY MOUTH EVERY DAY Patient taking differently: Take 25 mg by mouth daily.  10/05/18  Yes Campbell Riches, MD  SYMBICORT 160-4.5 MCG/ACT inhaler Inhale 2 puffs into the lungs 2 (two) times daily. 07/27/20  Yes [provider]  vitamin A 10000 UNIT capsule Take 10,000 Units by mouth daily.   Yes [provider]  vitamin B-12 (CYANOCOBALAMIN) 1000 MCG tablet Take 1,000 mcg by mouth daily.   Yes [provider]  Cetirizine HCl (ZYRTEC ALLERGY) 10 MG TBDP Take 1 tablet by mouth daily. Patient not taking: Reported on 10/29/2020 05/20/17   Campbell Riches, MD  triamcinolone cream (KENALOG) 0.1 % Apply 1 application topically 2 (two) times daily. Patient not taking: Reported on 10/29/2020 08/13/17   Ok Edwards, PA-C    Scheduled Meds: . vitamin C  1,000 mg Oral Daily  . atorvastatin  40 mg Oral Daily  . bictegravir-emtricitabine-tenofovir AF  1 tablet Oral Daily  . cholecalciferol  1,000 Units Oral Daily  . escitalopram  10 mg Oral Daily  . feeding supplement  1 Container Oral TID BM  . ferrous sulfate  325 mg Oral Q breakfast  . fluticasone furoate-vilanterol  1 puff Inhalation Daily  . gabapentin  100 mg Oral TID  . omega-3 acid ethyl esters  1 g Oral Daily  . pantoprazole (PROTONIX) IV  40 mg Intravenous Q12H  . vitamin A  10,000 Units Oral Daily  . vitamin B-12  1,000 mcg Oral Daily   Continuous Infusions: PRN Meds:.acyclovir, albuterol, diazepam, hydrALAZINE, meclizine  Allergies as of 10/29/2020 - Review Complete 10/29/2020  Allergen Reaction Noted  . Penicillins  01/26/2007  . Avelox [moxifloxacin hcl in nacl] Itching and Rash 10/13/2011    Family History  Problem Relation Age of Onset  . Diabetes Mother   . Ovarian cancer Sister   . Colon cancer  Sister     Social History   Socioeconomic History  . Marital status: Married    Spouse name: Not on file  . Number of children: Not on file  . Years of education: Not on file  . Highest education level: Not on file  Occupational History  . Occupation: retired  Tobacco Use  . Smoking status: Current Every Day Smoker    Packs/day: 1.00    Years: 60.00    Pack  years: 60.00    Types: Cigarettes  . Smokeless tobacco: Never Used  . Tobacco comment: Wants to quit. Using both the patch and gum.  Vaping Use  . Vaping Use: Never used  Substance and Sexual Activity  . Alcohol use: Yes    Alcohol/week: 10.0 standard drinks    Types: 10 Glasses of wine per week    Comment: drinking less recently, denies h/o heavy use  . Drug use: No  . Sexual activity: Yes    Partners: Male    Birth control/protection: Surgical    Comment: pt. given condoms 08/23/20  Other Topics Concern  . Not on file  Social History Narrative  . Not on file   Social Determinants of Health   Financial Resource Strain:   . Difficulty of Paying Living Expenses: Not on file  Food Insecurity:   . Worried About Charity fundraiser in the Last Year: Not on file  . Ran Out of Food in the Last Year: Not on file  Transportation Needs:   . Lack of Transportation (Medical): Not on file  . Lack of Transportation (Non-Medical): Not on file  Physical Activity:   . Days of Exercise per Week: Not on file  . Minutes of Exercise per Session: Not on file  Stress:   . Feeling of Stress : Not on file  Social Connections:   . Frequency of Communication with Friends and Family: Not on file  . Frequency of Social Gatherings with Friends and Family: Not on file  . Attends Religious Services: Not on file  . Active Member of Clubs or Organizations: Not on file  . Attends Archivist Meetings: Not on file  . Marital Status: Not on file  Intimate Partner Violence:   . Fear of Current or Ex-Partner: Not on file  .  Emotionally Abused: Not on file  . Physically Abused: Not on file  . Sexually Abused: Not on file    Review of Systems: All negative except as stated above in HPI.  Physical Exam: Vital signs: Vitals:   10/30/20 0221 10/30/20 0502  BP:  135/73  Pulse: 72 60  Resp: 20 16  Temp: (!) 97.3 F (36.3 C) 98.2 F (36.8 C)  SpO2: 98% 96%     .Physical Exam Vitals and nursing note reviewed.  Constitutional:      General: She is not in acute distress.    Appearance: She is well-developed.  HENT:     Head: Normocephalic and atraumatic.     Mouth/Throat:     Mouth: Mucous membranes are moist.     Pharynx: Oropharynx is clear.  Eyes:     Extraocular Movements: Extraocular movements intact.  Cardiovascular:     Rate and Rhythm: Normal rate and regular rhythm.     Heart sounds: Murmur heard.   Pulmonary:     Effort: Pulmonary effort is normal.     Breath sounds: Normal breath sounds. No stridor.  Chest:     Chest wall: No tenderness.  Abdominal:     General: Bowel sounds are normal.     Palpations: Abdomen is soft. There is no mass.     Tenderness: There is no abdominal tenderness. There is no guarding or rebound.  Musculoskeletal:     Right lower leg: No edema.     Left lower leg: No edema.  Skin:    General: Skin is warm.     Findings: No ecchymosis.  Neurological:     Mental Status:  She is alert and oriented to person, place, and time.  Psychiatric:        Mood and Affect: Mood normal.        Behavior: Behavior normal.     GI:  Lab Results: Recent Labs    10/29/20 1254 10/30/20 0447  WBC 4.3  --   HGB 4.1* 7.7*  HCT 15.1* 24.9*  PLT 257  --    BMET Recent Labs    10/29/20 1254  NA 139  K 4.0  CL 107  CO2 24  GLUCOSE 115*  BUN 21  CREATININE 1.27*  CALCIUM 8.6*   LFT No results for input(s): PROT, ALBUMIN, AST, ALT, ALKPHOS, BILITOT, BILIDIR, IBILI in the last 72 hours. PT/INR No results for input(s): LABPROT, INR in the last 72  hours.   Studies/Results: No results found.  Impression/Plan: -Recurrent symptomatic anemia most likely from chronic blood loss from small bowel AVMs.  Patient denies any blood in the stool or black stool. -Acute on chronic blood loss anemia.  Status post blood transfusion.  Hemoglobin improved to 7.7  Recommendations ------------------------- -Plan for push enteroscopy tomorrow.  Based on capsule endoscopy report, she has scattered small bowel AVMs throughout the small bowel and she may need a double-balloon enteroscopy at some point.  -Monitor H&H.  Okay to have soft diet today.  N.p.o. past midnight.  Risks (bleeding, infection, bowel perforation that could require surgery, sedation-related changes in cardiopulmonary systems), benefits (identification and possible treatment of source of symptoms, exclusion of certain causes of symptoms), and alternatives (watchful waiting, radiographic imaging studies, empiric medical treatment)  were explained to patient/family in detail and patient wishes to proceed.    LOS: 1 day   Otis Brace  MD, FACP 10/30/2020, 8:49 AM  Contact #  [phone]

## 2020-10-30 NOTE — Evaluation (Signed)
Physical Therapy Evaluation/Discharge Patient Details Name: Nicole Mccormick MRN: [id number] DOB: [birth year] Today's Date: 10/30/2020   History of Present Illness  Pt is a 74 yo female presenting on 11/21 to ED with SOB and weakness. PMH includes: seizures, pacemaker placement, HTN, HIV, AVM, and CAD.  Clinical Impression  Pt presents to PT with WNL bed mobility, transfers, and gait and states that she is at her baseline for functional mobility. Pt was able to perform all functional mobility necessary to go home with modified independence safely. Pt is in agreement that she doesn't need acute PT while n the hospital or PT at d/c.    Follow Up Recommendations No PT follow up    Equipment Recommendations       Recommendations for Other Services       Precautions / Restrictions Precautions Precautions: None      Mobility  Bed Mobility Overal bed mobility: Modified Independent                  Transfers Overall transfer level: Modified independent                  Ambulation/Gait Ambulation/Gait assistance: Modified independent (Device/Increase time) Gait Distance (Feet): 350 Feet   Gait Pattern/deviations: WFL(Within Functional Limits)   Gait velocity interpretation: >2.62 ft/sec, indicative of community ambulatory    Stairs            Wheelchair Mobility    Modified Rankin (Stroke Patients Only)       Balance Overall balance assessment: No apparent balance deficits (not formally assessed)                                           Pertinent Vitals/Pain Pain Assessment: No/denies pain    Home Living Family/patient expects to be discharged to:: Private residence Living Arrangements: Spouse/significant other;Other relatives (54 y/o granddaughter) Available Help at Discharge: Family;Available 24 hours/day Type of Home: House       Home Layout: One level Home Equipment: [place] - 2 wheels;Cane - single point;Cane - quad;Shower  seat Additional Comments: cooking, cleaning, and driving by herself    Prior Function Level of Independence: Independent with assistive device(s)               Hand Dominance        Extremity/Trunk Assessment   Upper Extremity Assessment Upper Extremity Assessment: Overall WFL for tasks assessed    Lower Extremity Assessment Lower Extremity Assessment: Overall WFL for tasks assessed    Cervical / Trunk Assessment Cervical / Trunk Assessment: Normal  Communication   Communication: No difficulties  Cognition Arousal/Alertness: Awake/alert Behavior During Therapy: WFL for tasks assessed/performed Overall Cognitive Status: Within Functional Limits for tasks assessed                                        General Comments      Exercises     Assessment/Plan    PT Assessment Patent does not need any further PT services  PT Problem List         PT Treatment Interventions      PT Goals (Current goals can be found in the Care Plan section)  Acute Rehab PT Goals PT Goal Formulation: With patient Time For Goal Achievement: 11/13/20 Potential to Achieve Goals:  Good    Frequency     Barriers to discharge        Co-evaluation               AM-PAC PT "6 Clicks" Mobility  Outcome Measure Help needed turning from your back to your side while in a flat bed without using bedrails?: A Little Help needed moving from lying on your back to sitting on the side of a flat bed without using bedrails?: A Little Help needed moving to and from a bed to a chair (including a wheelchair)?: None Help needed standing up from a chair using your arms (e.g., wheelchair or bedside chair)?: None Help needed to walk in hospital room?: None Help needed climbing 3-5 steps with a railing? : A Little 6 Click Score: 21    End of Session Equipment Utilized During Treatment: Gait belt Activity Tolerance: Patient tolerated treatment well Patient left: in chair;with  call bell/phone within reach;with nursing/sitter in room Nurse Communication: Mobility status PT Visit Diagnosis: Difficulty in walking, not elsewhere classified (R26.2)    Time: 0258-5277 PT Time Calculation (min) (ACUTE ONLY): 11 min   Charges:   PT Evaluation $PT Eval Low Complexity: 1 Low          Caleb Popp, SPT [id number]  Nicole Mccormick 10/30/2020, 9:35 AM

## 2020-10-30 NOTE — Progress Notes (Signed)
   10/30/20 1400  Clinical Encounter Type  Visited With Patient  Visit Type Other (Comment);Initial (Advanced Directive)  Referral From Care management  Consult/Referral To Chaplain  Spiritual Encounters  Spiritual Needs Prayer;Emotional;Grief support  Stress Factors  Patient Stress Factors Other (Comment);Loss (upcoming procedure)  Family Stress Factors Loss  Advance Directives (For Healthcare)  Does Patient Have a Medical Advance Directive? No  Would patient like information on creating a medical advance directive? Yes (MAU/Ambulatory/Procedural Areas - Information given)   Chaplain visited patient and provided information packet on advanced directives for health care. Patient said she would share the information with her daughter and look over it tonight when she comes for a visit. Patient also shared that she lost her son very unexpectedly a month ago, here at [facility]. Chaplain provided emotional support and prayer for her grief journey and her procedure tomorrow. Patient knows how to contact chaplain if further support needed or if any questions about AD.   Chaplain Rolin Barry

## 2020-10-30 NOTE — Progress Notes (Signed)
PROGRESS NOTE    Nicole Mccormick  TTS:[id number] DOB: 09/19/1946 DOA: 10/29/2020 PCP: Sonia Side., FNP    Brief Narrative:  [name] was admitted to the hospital with a working diagnosis symptomatic anemia due to  acute upper gastrointestinal bleed with acute blood loss anemia and hypovolemic shock.  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 Patient had a recent hospitalization for gastrointestinal bleeding in September 2021, she had extensive work-up including endoscopy, colonoscopy and capsule endoscopy.  Over the last 7 days she reports generalized weakness, dyspnea on exertion with decrease physical functional capacity.  On her initial physical examination her blood pressure was 80/55, heart rate 63, respiratory rate 21, oxygen saturation 100%, temperature 98.7 patient was pale, lungs clear to auscultation bilaterally, heart S1-S2, present rhythmic, soft abdomen, no lower extremity edema.  Sodium 139, potassium 4.0, chloride 107, bicarb 24, glucose 115, 1021, creatinine 1.27, white count 4.3, hemoglobin 4.1, hematocrit 15.1, platelets 257.  SARS COVID-19 negative.  Urinalysis 0-5 white cells, 0-5 red cells. EKG 64 bpm, normal axis, normal intervals, atrial pacing, ventricular sensing, no ST segment T wave changes.  Patient received intravenous fluids and 2 units packed red blood cells with improvement of hemoglobin of 7.7 Blood pressure improved to 092 mmHg systolic.  Gastroenterology has been consulted and plan for push enteroscopy in a.m.  Assessment & Plan:   Principal Problem:   GI bleed Active Problems:   Human immunodeficiency virus (HIV) disease (HCC)   Gastroesophageal reflux disease   Essential hypertension, benign   CAD (coronary artery disease)   Hepatitis C   Sick sinus syndrome (HCC)   Severe anemia   COPD  (chronic obstructive pulmonary disease) (HCC)   AVM (arteriovenous malformation) of stomach, acquired   Acute blood loss anemia   1. Symptomatic anemia due to acute blood loss anemia, due to upper GI bleed (AVM)/ hypovolemic shock Systolic blood pressure 330 to 076 mmHg systolic, Hgb is up to 7,7 after 2 units PRBC.   Continue medical therapy with pantoprazole 40 mg IV q12 and close H&H monitoring.  Soft diet today and NPO after midnight for push enteroscopy in am. Continue to hold on aspirin.  Now off IV fluids.   2. HTN/ dyslipidemia. Continue to hold on antihypertensive medications. Continue with statin therapy.   3. CAD. No chest pain, continue to hold on aspirin.   4. COPD. No signs of acute exacerbation. Continue ICS and LABA.   5. HIV. Continue with anti-retroviral therapy.  6. Iron deficiency. Continue with iron supplementation.   7. Depression. Continue with escitolopram, diazepam,   Patient continue to be at high risk for worsening anemia.   Status is: Inpatient  Remains inpatient appropriate because:IV treatments appropriate due to intensity of illness or inability to take PO   Dispo: The patient is from: Home              Anticipated d/c is to: Home              Anticipated d/c date is: 2 days              Patient currently is not medically stable to d/c.   DVT prophylaxis: scd   Code Status:   full  Family Communication:  No family at the bedside     Nutrition Status:  Skin Documentation:     Consultants:   GI     Subjective: Patient with improved energy, no nausea or vomiting, no melena, or hematochezia, no hematemesis.   Objective: Vitals:   10/29/20 2205 10/29/20 2250 10/30/20 0221 10/30/20 0502  BP: 120/65 (!) 140/57  135/73  Pulse: 67 71 72 60  Resp: 17 20 20 16   Temp: 98 F (36.7 C) 97.6 F (36.4 C) (!) 97.3 F (36.3 C) 98.2 F (36.8 C)  TempSrc: Oral Axillary Oral Oral  SpO2: 100% 99% 98% 96%    Intake/Output  Summary (Last 24 hours) at 10/30/2020 1430 Last data filed at 10/30/2020 0216 Gross per 24 hour  Intake 1069.59 ml  Output -  Net 1069.59 ml   There were no vitals filed for this visit.  Examination:   General: Not in pain or dyspnea.  Neurology: Awake and alert, non focal  E ENT: no pallor, no icterus, oral mucosa moist Cardiovascular: No JVD. S1-S2 present, rhythmic, no gallops, rubs, or murmurs. No lower extremity edema. Pulmonary: positive breath sounds bilaterally, with wheezing, scattered rhonchi with no rales. Gastrointestinal. Abdomen soft and non tender Skin. No rashes Musculoskeletal: no joint deformities     Data Reviewed: I have personally reviewed following labs and imaging studies  CBC: Recent Labs  Lab 10/29/20 1254 10/30/20 0447  WBC 4.3  --   HGB 4.1* 7.7*  HCT 15.1* 24.9*  MCV 102.7*  --   PLT 257  --    Basic Metabolic Panel: Recent Labs  Lab 10/29/20 1254  NA 139  K 4.0  CL 107  CO2 24  GLUCOSE 115*  BUN 21  CREATININE 1.27*  CALCIUM 8.6*   GFR: CrCl cannot be calculated (Unknown ideal weight.). Liver Function Tests: No results for input(s): AST, ALT, ALKPHOS, BILITOT, PROT, ALBUMIN in the last 168 hours. No results for input(s): LIPASE, AMYLASE in the last 168 hours. No results for input(s): AMMONIA in the last 168 hours. Coagulation Profile: No results for input(s): INR, PROTIME in the last 168 hours. Cardiac Enzymes: No results for input(s): CKTOTAL, CKMB, CKMBINDEX, TROPONINI in the last 168 hours. BNP (last 3 results) No results for input(s): PROBNP in the last 8760 hours. HbA1C: No results for input(s): HGBA1C in the last 72 hours. CBG: Recent Labs  Lab 10/29/20 1329  GLUCAP 99   Lipid Profile: No results for input(s): CHOL, HDL, LDLCALC, TRIG, CHOLHDL, LDLDIRECT in the last 72 hours. Thyroid Function Tests: No results for input(s): TSH, T4TOTAL, FREET4, T3FREE, THYROIDAB in the last 72 hours. Anemia Panel: No results  for input(s): VITAMINB12, FOLATE, FERRITIN, TIBC, IRON, RETICCTPCT in the last 72 hours.    Radiology Studies: I have reviewed all of the imaging during this hospital visit personally     Scheduled Meds: . vitamin C  1,000 mg Oral Daily  . atorvastatin  40 mg Oral Daily  . bictegravir-emtricitabine-tenofovir AF  1 tablet Oral Daily  . cholecalciferol  1,000 Units Oral Daily  . escitalopram  10 mg Oral Daily  . feeding supplement  1 Container Oral TID BM  . ferrous sulfate  325 mg Oral Q breakfast  . fluticasone furoate-vilanterol  1 puff Inhalation Daily  . gabapentin  100 mg Oral TID  . omega-3 acid ethyl esters  1 g Oral Daily  . pantoprazole (PROTONIX) IV  40 mg Intravenous Q12H  . vitamin A  10,000 Units Oral Daily  . vitamin B-12  1,000 mcg Oral Daily   Continuous Infusions:  LOS: 1 day        Mauricio Gerome Apley, MD

## 2020-10-30 NOTE — H&P (View-Only) (Signed)
Referring Provider: [facility] Primary Care Physician:  Sonia Side., FNP Primary Gastroenterologist:  [facility] / Dr. Penelope Coop   Reason for Consultation: Recurrent anemia/recurrent GI bleed  HPI: Nicole Mccormick is a 74 y.o. female with past medical history of recurrent anemia/recurrent GI bleed from history of AVMs, history of coronary disease on aspirin, history of HIV on HAART, history of bradycardia status post pacemaker placement presented to the hospital with fatigue and weakness.  Upon initial evaluation she was found to have hemoglobin of 4.3.  GI is consulted for further evaluation.  Patient seen and examined at bedside.  She is denying any GI symptoms.  Denies seeing any blood in the stool or black stool.  Previous GI work-up Capsule endoscopy in September 2021 showed 3 AVMs scattered throughout the small bowel with possible AVM in the proximal small bowel.  EGD in June 2021 showed small antral hernia and one small nonbleeding AVM in the duodenum.  Colonoscopy in June 2021 showed 2 small polyps in the sigmoid colon and internal hemorrhoids.  No evidence of active bleeding.  Pathology showed tubular adenomas.  EGD in about 2015 showed small AVMs in the duodenum but no significant bleeding. Colonoscopy in October 2015 showed condylomata not otherwise no polyps or bleeding seen. Capsule endoscopy in October 2015 showed multiple AVMs in the proximal small bowel.  Past Medical History:  Diagnosis Date  . Abnormal Pap smear   . Aortic stenosis   . Arthritis   . Asthma   . AVM (arteriovenous malformation)   . Bradycardia   . Complication of anesthesia    "they have a hard time bringing me back" (11/08/2015)  . Coronary artery disease    a. diffuse CAD of RCA by cath 2016 managed medically.  . Dynamic left ventricular outflow obstruction   . GERD (gastroesophageal reflux disease)    previously on aciphex, discontinued november 2012  because patient asymptomatic, and concern about interference  with HIV meds.  May try pepcid in the future if symptoms return  . Heart murmur   . Hepatitis C antibody test positive   . HIV infection ([place])    diagnosed before 2008  . Hypertension   . Hypertensive heart disease   . Iron deficiency anemia    Ferritin = 2 in november 2012, started on iron supplemenation  . Pacemaker    a. St Jude PPM 01/2018.  . Seizures ([place])   . Symptomatic anemia 08/24/2020  . Varicosities     Past Surgical History:  Procedure Laterality Date  . ABDOMINAL HYSTERECTOMY    . CARDIAC CATHETERIZATION N/A 11/08/2015   Procedure: Left Heart Cath and Coronary Angiography;  Surgeon: Jettie Booze, MD;  Location: [place] [facility];  Service: Cardiovascular;  Laterality: N/A;  . COLONOSCOPY  10/13/11   small adenoma, anal condyloma  . COLONOSCOPY  10/13/2011   Procedure: COLONOSCOPY;  Surgeon: Gatha Mayer, MD;  Location: [place];  Service: Endoscopy;  Laterality: N/A;  . COLONOSCOPY N/A 09/14/2014   Procedure: COLONOSCOPY;  Surgeon: Arta Silence, MD;  Location: [facility] ENDOSCOPY;  Service: Endoscopy;  Laterality: N/A;  . COLONOSCOPY WITH PROPOFOL N/A 06/05/2020   Procedure: COLONOSCOPY WITH PROPOFOL;  Surgeon: Arta Silence, MD;  Location: [place];  Service: Endoscopy;  Laterality: N/A;  . ESOPHAGOGASTRODUODENOSCOPY  10/13/11   small hiatal hernia  . ESOPHAGOGASTRODUODENOSCOPY  10/13/2011   Procedure: ESOPHAGOGASTRODUODENOSCOPY (EGD);  Surgeon: Gatha Mayer, MD;  Location: [facility];  Service: Endoscopy;  Laterality: N/A;  . ESOPHAGOGASTRODUODENOSCOPY N/A  09/14/2014   Procedure: ESOPHAGOGASTRODUODENOSCOPY (EGD);  Surgeon: Arta Silence, MD;  Location: [facility] ENDOSCOPY;  Service: Endoscopy;  Laterality: N/A;  . ESOPHAGOGASTRODUODENOSCOPY (EGD) WITH PROPOFOL N/A 06/05/2020   Procedure: ESOPHAGOGASTRODUODENOSCOPY (EGD) WITH PROPOFOL;  Surgeon: Arta Silence, MD;  Location: [place];  Service: Endoscopy;  Laterality: N/A;  . GIVENS CAPSULE STUDY N/A  09/14/2014   Procedure: GIVENS CAPSULE STUDY;  Surgeon: Arta Silence, MD;  Location: [facility] ENDOSCOPY;  Service: Endoscopy;  Laterality: N/A;  . GIVENS CAPSULE STUDY N/A 08/26/2020   Procedure: GIVENS CAPSULE STUDY;  Surgeon: Wonda Horner, MD;  Location: [facility] ENDOSCOPY;  Service: Endoscopy;  Laterality: N/A;  . PACEMAKER IMPLANT N/A 02/05/2018   Procedure: PACEMAKER IMPLANT;  Surgeon: Thompson Grayer, MD;  Location: [place] [facility];  Service: Cardiovascular;  Laterality: N/A;  . POLYPECTOMY  06/05/2020   Procedure: POLYPECTOMY;  Surgeon: Arta Silence, MD;  Location: [facility] ENDOSCOPY;  Service: Endoscopy;;    Prior to Admission medications   Medication Sig Start Date End Date Taking? Authorizing Provider  acyclovir (ZOVIRAX) 400 MG tablet Take 1 tablet (400 mg total) by mouth daily as needed (for flare ups). 07/06/18  Yes Campbell Riches, MD  albuterol (PROAIR HFA) 108 (90 Base) MCG/ACT inhaler INHALE 2 PUFFS BY MOUTH EVERY 4 HOURS IF NEEDED FOR WHEEZING OR SHORTNESS OF BREATH Patient taking differently: Inhale 2 puffs into the lungs every 4 (four) hours as needed for wheezing or shortness of breath.  01/06/19  Yes Campbell Riches, MD  ALPRAZolam Duanne Moron) 0.5 MG tablet Take 0.5 mg by mouth 3 (three) times daily as needed for anxiety. 10/04/20  Yes [provider]  amLODipine (NORVASC) 10 MG tablet TAKE 1 TABLET BY MOUTH EVERY DAY Patient taking differently: Take 10 mg by mouth daily.  10/05/18  Yes Campbell Riches, MD  Ascorbic Acid (VITAMIN C) 1000 MG tablet Take 1,000 mg by mouth daily.   Yes [provider]  aspirin 81 MG EC tablet Take 81 mg by mouth daily. 07/27/20  Yes [provider]  atorvastatin (LIPITOR) 40 MG tablet take 1 tablet by mouth once daily AT 6 PM Patient taking differently: Take 40 mg by mouth every evening.  12/16/17  Yes Dixon, Melton Krebs, NP  BIKTARVY 50-200-25 MG TABS tablet TAKE 1 TABLET BY MOUTH EVERY DAY Patient taking differently: Take 1  tablet by mouth daily.  04/05/19  Yes Campbell Riches, MD  carvedilol (COREG) 3.125 MG tablet TAKE 1 TABLET(3.125 MG) BY MOUTH TWICE DAILY Patient taking differently: Take 3.125 mg by mouth 2 (two) times daily with a meal.  03/23/20  Yes Allred, Jeneen Rinks, MD  cetirizine (ZYRTEC) 10 MG tablet Take 10 mg by mouth daily. 10/09/20  Yes [provider]  cholecalciferol (VITAMIN D) 1000 units tablet Take 1,000 Units by mouth daily.   Yes [provider]  dextromethorphan (DELSYM) 30 MG/5ML liquid Take 30 mg by mouth as needed for cough.    Yes [provider]  diazepam (VALIUM) 2 MG tablet take 1 tablet by mouth every 6 hours if needed for anxiety Patient taking differently: Take 2 mg by mouth as needed for anxiety.  07/21/15  Yes Shela Leff, MD  escitalopram (LEXAPRO) 10 MG tablet Take 10 mg by mouth daily as needed (anxiety).  12/23/18  Yes [provider]  famotidine (PEPCID) 20 MG tablet Take 20 mg by mouth 2 (two) times daily. 10/09/20  Yes [provider]  ferrous sulfate 325 (65 FE) MG EC tablet Take  1 tablet (325 mg total) by mouth in the morning and at bedtime. 08/26/20 11/24/20 Yes [place] Eric J, DO  Flaxseed, Linseed, (FLAX SEED OIL) 1000 MG CAPS Take 1,000 mg by mouth daily.   Yes [provider]  gabapentin (NEURONTIN) 100 MG capsule Take 100 mg by mouth 3 (three) times daily.   Yes [provider]  hydrochlorothiazide (HYDRODIURIL) 25 MG tablet Take 25 mg by mouth daily.  03/30/20  Yes [provider]  isosorbide mononitrate (IMDUR) 60 MG 24 hr tablet Take 1 tablet (60 mg total) by mouth daily. 07/06/18  Yes Campbell Riches, MD  lisinopril (ZESTRIL) 40 MG tablet Take 40 mg by mouth daily. 05/11/20  Yes [provider]  meclizine (ANTIVERT) 25 MG tablet Take 25 mg by mouth as needed for dizziness.  08/03/20  Yes [provider]  Omega-3 Fatty Acids (FISH OIL) 1000 MG CPDR Take 1,000 mg by mouth daily.    Yes [provider]  pantoprazole (PROTONIX) 40 MG tablet Take 1 tablet (40 mg total) by mouth daily. 12/16/17  Yes [place] Callas, NP  spironolactone (ALDACTONE) 25 MG tablet TAKE 1 TABLET BY MOUTH EVERY DAY Patient taking differently: Take 25 mg by mouth daily.  10/05/18  Yes Campbell Riches, MD  SYMBICORT 160-4.5 MCG/ACT inhaler Inhale 2 puffs into the lungs 2 (two) times daily. 07/27/20  Yes [provider]  vitamin A 10000 UNIT capsule Take 10,000 Units by mouth daily.   Yes [provider]  vitamin B-12 (CYANOCOBALAMIN) 1000 MCG tablet Take 1,000 mcg by mouth daily.   Yes [provider]  Cetirizine HCl (ZYRTEC ALLERGY) 10 MG TBDP Take 1 tablet by mouth daily. Patient not taking: Reported on 10/29/2020 05/20/17   Campbell Riches, MD  triamcinolone cream (KENALOG) 0.1 % Apply 1 application topically 2 (two) times daily. Patient not taking: Reported on 10/29/2020 08/13/17   Ok Edwards, PA-C    Scheduled Meds: . vitamin C  1,000 mg Oral Daily  . atorvastatin  40 mg Oral Daily  . bictegravir-emtricitabine-tenofovir AF  1 tablet Oral Daily  . cholecalciferol  1,000 Units Oral Daily  . escitalopram  10 mg Oral Daily  . feeding supplement  1 Container Oral TID BM  . ferrous sulfate  325 mg Oral Q breakfast  . fluticasone furoate-vilanterol  1 puff Inhalation Daily  . gabapentin  100 mg Oral TID  . omega-3 acid ethyl esters  1 g Oral Daily  . pantoprazole (PROTONIX) IV  40 mg Intravenous Q12H  . vitamin A  10,000 Units Oral Daily  . vitamin B-12  1,000 mcg Oral Daily   Continuous Infusions: PRN Meds:.acyclovir, albuterol, diazepam, hydrALAZINE, meclizine  Allergies as of 10/29/2020 - Review Complete 10/29/2020  Allergen Reaction Noted  . Penicillins  01/26/2007  . Avelox [moxifloxacin hcl in nacl] Itching and Rash 10/13/2011    Family History  Problem Relation Age of Onset  . Diabetes Mother   . Ovarian cancer Sister   . Colon cancer  Sister     Social History   Socioeconomic History  . Marital status: Married    Spouse name: Not on file  . Number of children: Not on file  . Years of education: Not on file  . Highest education level: Not on file  Occupational History  . Occupation: retired  Tobacco Use  . Smoking status: Current Every Day Smoker    Packs/day: 1.00    Years: 60.00    Pack  years: 60.00    Types: Cigarettes  . Smokeless tobacco: Never Used  . Tobacco comment: Wants to quit. Using both the patch and gum.  Vaping Use  . Vaping Use: Never used  Substance and Sexual Activity  . Alcohol use: Yes    Alcohol/week: 10.0 standard drinks    Types: 10 Glasses of wine per week    Comment: drinking less recently, denies h/o heavy use  . Drug use: No  . Sexual activity: Yes    Partners: Male    Birth control/protection: Surgical    Comment: pt. given condoms 08/23/20  Other Topics Concern  . Not on file  Social History Narrative  . Not on file   Social Determinants of Health   Financial Resource Strain:   . Difficulty of Paying Living Expenses: Not on file  Food Insecurity:   . Worried About Charity fundraiser in the Last Year: Not on file  . Ran Out of Food in the Last Year: Not on file  Transportation Needs:   . Lack of Transportation (Medical): Not on file  . Lack of Transportation (Non-Medical): Not on file  Physical Activity:   . Days of Exercise per Week: Not on file  . Minutes of Exercise per Session: Not on file  Stress:   . Feeling of Stress : Not on file  Social Connections:   . Frequency of Communication with Friends and Family: Not on file  . Frequency of Social Gatherings with Friends and Family: Not on file  . Attends Religious Services: Not on file  . Active Member of Clubs or Organizations: Not on file  . Attends Archivist Meetings: Not on file  . Marital Status: Not on file  Intimate Partner Violence:   . Fear of Current or Ex-Partner: Not on file  .  Emotionally Abused: Not on file  . Physically Abused: Not on file  . Sexually Abused: Not on file    Review of Systems: All negative except as stated above in HPI.  Physical Exam: Vital signs: Vitals:   10/30/20 0221 10/30/20 0502  BP:  135/73  Pulse: 72 60  Resp: 20 16  Temp: (!) 97.3 F (36.3 C) 98.2 F (36.8 C)  SpO2: 98% 96%     .Physical Exam Vitals and nursing note reviewed.  Constitutional:      General: She is not in acute distress.    Appearance: She is well-developed.  HENT:     Head: Normocephalic and atraumatic.     Mouth/Throat:     Mouth: Mucous membranes are moist.     Pharynx: Oropharynx is clear.  Eyes:     Extraocular Movements: Extraocular movements intact.  Cardiovascular:     Rate and Rhythm: Normal rate and regular rhythm.     Heart sounds: Murmur heard.   Pulmonary:     Effort: Pulmonary effort is normal.     Breath sounds: Normal breath sounds. No stridor.  Chest:     Chest wall: No tenderness.  Abdominal:     General: Bowel sounds are normal.     Palpations: Abdomen is soft. There is no mass.     Tenderness: There is no abdominal tenderness. There is no guarding or rebound.  Musculoskeletal:     Right lower leg: No edema.     Left lower leg: No edema.  Skin:    General: Skin is warm.     Findings: No ecchymosis.  Neurological:     Mental Status:  She is alert and oriented to person, place, and time.  Psychiatric:        Mood and Affect: Mood normal.        Behavior: Behavior normal.     GI:  Lab Results: Recent Labs    10/29/20 1254 10/30/20 0447  WBC 4.3  --   HGB 4.1* 7.7*  HCT 15.1* 24.9*  PLT 257  --    BMET Recent Labs    10/29/20 1254  NA 139  K 4.0  CL 107  CO2 24  GLUCOSE 115*  BUN 21  CREATININE 1.27*  CALCIUM 8.6*   LFT No results for input(s): PROT, ALBUMIN, AST, ALT, ALKPHOS, BILITOT, BILIDIR, IBILI in the last 72 hours. PT/INR No results for input(s): LABPROT, INR in the last 72  hours.   Studies/Results: No results found.  Impression/Plan: -Recurrent symptomatic anemia most likely from chronic blood loss from small bowel AVMs.  Patient denies any blood in the stool or black stool. -Acute on chronic blood loss anemia.  Status post blood transfusion.  Hemoglobin improved to 7.7  Recommendations ------------------------- -Plan for push enteroscopy tomorrow.  Based on capsule endoscopy report, she has scattered small bowel AVMs throughout the small bowel and she may need a double-balloon enteroscopy at some point.  -Monitor H&H.  Okay to have soft diet today.  N.p.o. past midnight.  Risks (bleeding, infection, bowel perforation that could require surgery, sedation-related changes in cardiopulmonary systems), benefits (identification and possible treatment of source of symptoms, exclusion of certain causes of symptoms), and alternatives (watchful waiting, radiographic imaging studies, empiric medical treatment)  were explained to patient/family in detail and patient wishes to proceed.    LOS: 1 day   Otis Brace  MD, FACP 10/30/2020, 8:49 AM  Contact #  [phone]

## 2020-10-31 ENCOUNTER — Encounter (HOSPITAL_COMMUNITY)
Admission: EM | Disposition: A | Discharge status: Home / Self Care | Payer: Self-pay | Source: Home / Self Care | Attending: Internal Medicine

## 2020-10-31 ENCOUNTER — Encounter (HOSPITAL_COMMUNITY): Payer: Self-pay | Admitting: Internal Medicine

## 2020-10-31 ENCOUNTER — Inpatient Hospital Stay (HOSPITAL_COMMUNITY): Payer: 59 | Admitting: Certified Registered Nurse Anesthetist

## 2020-10-31 DIAGNOSIS — D62 Acute posthemorrhagic anemia: Secondary | ICD-10-CM | POA: Diagnosis not present

## 2020-10-31 DIAGNOSIS — D649 Anemia, unspecified: Secondary | ICD-10-CM | POA: Diagnosis not present

## 2020-10-31 DIAGNOSIS — K31819 Angiodysplasia of stomach and duodenum without bleeding: Secondary | ICD-10-CM | POA: Diagnosis not present

## 2020-10-31 DIAGNOSIS — K31811 Angiodysplasia of stomach and duodenum with bleeding: Secondary | ICD-10-CM | POA: Diagnosis not present

## 2020-10-31 HISTORY — PX: ENTEROSCOPY: SHX5533

## 2020-10-31 HISTORY — PX: HOT HEMOSTASIS: SHX5433

## 2020-10-31 LAB — CBC
HCT: 26.3 % — ABNORMAL LOW (ref 36.0–46.0)
Hemoglobin: 8 g/dL — ABNORMAL LOW (ref 12.0–15.0)
MCH: 28.2 pg (ref 26.0–34.0)
MCHC: 30.4 g/dL (ref 30.0–36.0)
MCV: 92.6 fL (ref 80.0–100.0)
Platelets: 209 10*3/uL (ref 150–400)
RBC: 2.84 MIL/uL — ABNORMAL LOW (ref 3.87–5.11)
RDW: 17.1 % — ABNORMAL HIGH (ref 11.5–15.5)
WBC: 3.9 10*3/uL — ABNORMAL LOW (ref 4.0–10.5)
nRBC: 0 % (ref 0.0–0.2)

## 2020-10-31 SURGERY — ENTEROSCOPY
Anesthesia: Monitor Anesthesia Care

## 2020-10-31 MED ORDER — LACTATED RINGERS IV SOLN: INTRAVENOUS | Status: DC | PRN

## 2020-10-31 MED ORDER — SODIUM CHLORIDE 0.9 % IV SOLN: INTRAVENOUS | Status: DC

## 2020-10-31 MED ORDER — LIDOCAINE 2% (20 MG/ML) 5 ML SYRINGE
INTRAMUSCULAR | Status: DC | PRN
  Administered 2020-10-31: 40 mg via INTRAVENOUS

## 2020-10-31 MED ORDER — PROPOFOL 10 MG/ML IV BOLUS
INTRAVENOUS | Status: DC | PRN
  Administered 2020-10-31: 50 mg via INTRAVENOUS
  Administered 2020-10-31: 10 mg via INTRAVENOUS

## 2020-10-31 MED ORDER — PROPOFOL 500 MG/50ML IV EMUL
INTRAVENOUS | Status: DC | PRN
  Administered 2020-10-31: 100 ug/kg/min via INTRAVENOUS

## 2020-10-31 NOTE — Transfer of Care (Signed)
Immediate Anesthesia Transfer of Care Note  Patient: Nicole Mccormick  Procedure(s) Performed: ENTEROSCOPY (N/A ) HOT HEMOSTASIS (ARGON PLASMA COAGULATION/BICAP) (N/A )  Patient Location: Endoscopy Unit  Anesthesia Type:MAC  Level of Consciousness: awake and drowsy  Airway & Oxygen Therapy: Patient Spontanous Breathing and Patient connected to nasal cannula oxygen  Post-op Assessment: Report given to RN and Post -op Vital signs reviewed and stable  Post vital signs: Reviewed and stable  Last Vitals:  Vitals Value Taken Time  BP    Temp    Pulse    Resp    SpO2      Last Pain:  Vitals:   10/31/20 0740  TempSrc: Temporal  PainSc: 0-No pain         Complications: No complications documented.

## 2020-10-31 NOTE — Plan of Care (Signed)
  Problem: Clinical Measurements: Goal: Respiratory complications will improve Outcome: Progressing   Problem: Clinical Measurements: Goal: Cardiovascular complication will be avoided Outcome: Progressing   Problem: Activity: Goal: Risk for activity intolerance will decrease Outcome: Progressing   Problem: Nutrition: Goal: Adequate nutrition will be maintained Outcome: Progressing   

## 2020-10-31 NOTE — Brief Op Note (Signed)
10/29/2020 - 10/31/2020  9:03 AM  PATIENT:  [name]  74 y.o. female  PRE-OPERATIVE DIAGNOSIS:  GI bleed  POST-OPERATIVE DIAGNOSIS:  APC AVM x 6  PROCEDURE:  Procedure(s): ENTEROSCOPY (N/A) HOT HEMOSTASIS (ARGON PLASMA COAGULATION/BICAP) (N/A)  SURGEON:  Surgeon(s) and Role:    * Danira Nylander, MD - Primary  Findings ---------- -Multiple AVMs in the duodenum and jejunum. 1 with stigmata of bleeding. Treated with APC.  Recommendations ------------------------- -Start soft diet and advance as tolerated -Okay to discharge from GI standpoint after lunch today. -If evidence of ongoing anemia, recommend referral to tertiary care center for double-balloon enteroscopy for treatment of distal AVMs. -GI will sign off. Call us back if needed  Otis Brace MD, [place] 10/31/2020, 9:04 AM  Contact #  [phone]

## 2020-10-31 NOTE — Op Note (Signed)
[facility] Patient Name: Nicole Mccormick Procedure Date : 10/31/2020 MRN: [id number] Attending MD: Otis Brace , MD Date of Birth: 1946/07/08 CSN: [id number] Age: 74 Admit Type: Inpatient Procedure:                Small bowel enteroscopy Indications:              Arteriovenous malformation in the small intestine Providers:                Otis Brace, MD, Particia Nearing, RN, Lesia Sago, Technician, Garrison [place], CRNA, Cletis [place], Technician Referring MD:              Medicines:                Sedation Administered by an Anesthesia Professional Complications:            No immediate complications. Estimated Blood Loss:     Estimated blood loss was minimal. Procedure:                Pre-Anesthesia Assessment:                           - Prior to the procedure, a History and Physical                            was performed, and patient medications and                            allergies were reviewed. The patient's tolerance of                            previous anesthesia was also reviewed. The risks                            and benefits of the procedure and the sedation                            options and risks were discussed with the patient.                            All questions were answered, and informed consent                            was obtained. Prior Anticoagulants: The patient has                            taken no previous anticoagulant or antiplatelet                            agents. ASA Grade Assessment: III - A patient with  severe systemic disease. After reviewing the risks                            and benefits, the patient was deemed in                            satisfactory condition to undergo the procedure.                           After obtaining informed consent, the endoscope was                            passed under direct vision.  Throughout the                            procedure, the patient's blood pressure, pulse, and                            oxygen saturations were monitored continuously. The                            [id number]-PH190L ([id number] Olympus ultra slim colonoscope                            was introduced through the mouth and advanced to                            the jejunum, to the 110 cm mark (from the                            incisors). Scope was changed to Peds colonoscopy                            because of looping. The small bowel enteroscopy was                            accomplished without difficulty. The patient                            tolerated the procedure well. Scope In: Scope Out: Findings:      The Z-line was regular and was found 38 cm from the incisors.      Scattered mild inflammation characterized by congestion (edema),       erosions and erythema was found in the gastric body.      A few angioectasias with no bleeding were found in the fourth portion of       the duodenum. Fulguration to ablate the lesion to prevent bleeding by       argon plasma at 2 liters/minute and 20 watts was successful.      A single angioectasia with stigmata of recent bleeding was found at 100       cm (from the incisors). Fulguration to stop the bleeding by argon plasma       at 2 liters/minute and 20 watts was successful.      A few  angioectasias with no bleeding were found in the proximal jejunum.       Fulguration to ablate the lesion to prevent bleeding by argon plasma at       2 liters/minute and 20 watts was successful. Impression:               - Z-line regular, 38 cm from the incisors.                           - Gastritis.                           - A few non-bleeding angioectasias in the duodenum.                            Treated with argon plasma coagulation (APC).                           - A single recently bleeding angioectasia in the                            jejunum.  Treated with argon plasma coagulation                            (APC).                           - A few non-bleeding angioectasias in the jejunum.                            Treated with argon plasma coagulation (APC).                           - No specimens collected. Recommendation:           - Return patient to hospital ward for ongoing care.                           - Resume regular diet.                           - Continue present medications.                           - Refer to [facility] for double balloon enteroscopy if                            ongoing anemia if symptoms persist. Procedure Code(s):        --- Professional ---                           [phone], Small intestinal endoscopy, enteroscopy                            beyond second portion of duodenum, not including  ileum; with control of bleeding (eg, injection,                            bipolar cautery, unipolar cautery, laser, heater                            probe, stapler, plasma coagulator) Diagnosis Code(s):        --- Professional ---                           K29.70, Gastritis, unspecified, without bleeding                           K31.819, Angiodysplasia of stomach and duodenum                            without bleeding                           K55.21, Angiodysplasia of colon with hemorrhage CPT copyright 2019 American Medical Association. All rights reserved. The codes documented in this report are preliminary and upon coder review may  be revised to meet current compliance requirements. Otis Brace, MD Otis Brace, MD 10/31/2020 9:03:18 AM Number of Addenda: 0

## 2020-10-31 NOTE — Plan of Care (Signed)

## 2020-10-31 NOTE — Anesthesia Procedure Notes (Signed)
Procedure Name: MAC Date/Time: 10/31/2020 8:20 AM Performed by: Harden Mo, CRNA Pre-anesthesia Checklist: Patient identified, Emergency Drugs available, Suction available and Patient being monitored Patient Re-evaluated:Patient Re-evaluated prior to induction Oxygen Delivery Method: Nasal cannula Preoxygenation: Pre-oxygenation with 100% oxygen Induction Type: IV induction Placement Confirmation: positive ETCO2 and breath sounds checked- equal and bilateral Dental Injury: Teeth and Oropharynx as per pre-operative assessment

## 2020-10-31 NOTE — TOC Initial Note (Signed)
Transition of Care [facility]) - Initial/Assessment Note    Patient Details  Name: Nicole Mccormick MRN: [id number] Date of Birth: 1946/01/29  Transition of Care [facility]) CM/SW Contact:    Benard Halsted, LCSW Phone Number: 10/31/2020, 11:11 AM  Clinical Narrative:                 Patient with high readmission risk score. She has insurance and a PCP to follow up with. No needs identified.   Expected Discharge Plan: Home/Self Care Barriers to Discharge: No Barriers Identified   Patient Goals and CMS Choice        Expected Discharge Plan and Services Expected Discharge Plan: Home/Self Care       Living arrangements for the past 2 months: Apartment Expected Discharge Date: 10/31/20                                    Prior Living Arrangements/Services Living arrangements for the past 2 months: Apartment Lives with:: Spouse                   Activities of Daily Living Home Assistive Devices/Equipment: Bedside commode/3-in-1, Cane (specify quad or straight), Shower chair without back (straight & quad) ADL Screening (condition at time of admission) Patient's cognitive ability adequate to safely complete daily activities?: Yes Is the patient deaf or have difficulty hearing?: No Does the patient have difficulty seeing, even when wearing glasses/contacts?: No Does the patient have difficulty concentrating, remembering, or making decisions?: No Patient able to express need for assistance with ADLs?: Yes Does the patient have difficulty dressing or bathing?: No Independently performs ADLs?: Yes (appropriate for developmental age) Does the patient have difficulty walking or climbing stairs?: Yes Weakness of Legs: Both Weakness of Arms/Hands: Both  Permission Sought/Granted                  Emotional Assessment       Orientation: : Oriented to Self, Oriented to Place, Oriented to  Time, Oriented to Situation Alcohol / Substance Use: Not Applicable Psych Involvement:  No (comment)  Admission diagnosis:  GI bleed [K92.2] Severe anemia [D64.9] Hypotension due to hypovolemia [I95.89, E86.1] Patient Active Problem List   Diagnosis Date Noted  . Acute blood loss anemia 10/30/2020  . AVM (arteriovenous malformation) of stomach, acquired 08/26/2020  . COPD (chronic obstructive pulmonary disease) ([place]) 08/24/2020  . Arthritis 08/23/2020  . Dyspnea   . Shortness of breath   . Severe anemia 06/03/2020  . Chronic arthralgias of knees and hips 07/06/2018  . Sick sinus syndrome ([place]) 02/28/2016  . Hypertensive cardiovascular disease 02/28/2016  . Bilateral carotid bruits 02/12/2016  . LVH (left ventricular hypertrophy) 01/15/2016  . GI bleed 11/07/2015  . Anxiety 07/22/2015  . Seasonal allergies 07/22/2015  . Asthma, chronic 07/22/2015  . Health care maintenance 02/17/2015  . Hepatitis C 08/07/2014  . Back pain 10/28/2012  . LGSIL Pap smear of vagina 01/16/2012  . Anal condylomata 11/18/2011  . CAD (coronary artery disease) 08/07/2011  . Essential hypertension, benign 12/25/2010  . Human immunodeficiency virus (HIV) disease ([place]) 01/23/2007  . TOBACCO ABUSE 01/23/2007  . CATARACT NEC 01/23/2007  . Gastroesophageal reflux disease 01/23/2007   PCP:  Sonia Side., FNP Pharmacy:   [facility] [place], [place] - [address] [place] [address] Phone: [phone] Fax: [phone]     Social Determinants of Health (  SDOH) Interventions    Readmission Risk Interventions Readmission Risk Prevention Plan 10/31/2020  Transportation Screening Complete  PCP or Specialist Appt within 3-5 Days Complete  HRI or [place] Complete  Social Work Consult for [place] Planning/Counseling Complete  Palliative Care Screening Not Applicable  Medication Review Press photographer) Referral to Pharmacy  Some recent data might be hidden

## 2020-10-31 NOTE — Anesthesia Preprocedure Evaluation (Signed)
Anesthesia Evaluation  Patient identified by MRN, date of birth, ID band Patient awake    Reviewed: Allergy & Precautions, NPO status , Patient's Chart, lab work & pertinent test results, reviewed documented beta blocker date and time   History of Anesthesia Complications (+) PROLONGED EMERGENCE and history of anesthetic complications  Airway Mallampati: II  TM Distance: >3 FB Neck ROM: Full    Dental  (+) Upper Dentures   Pulmonary asthma , COPD,  COPD inhaler, Current Smoker and Patient abstained from smoking.,    Pulmonary exam normal breath sounds clear to auscultation       Cardiovascular hypertension, Pt. on medications and Pt. on home beta blockers + CAD  + pacemaker + Valvular Problems/Murmurs AS  Rhythm:Regular Rate:Normal + Systolic murmurs    Neuro/Psych Seizures -,  PSYCHIATRIC DISORDERS Anxiety CVA, No Residual Symptoms    GI/Hepatic GERD  Medicated,(+) Hepatitis -, CGI Bleed    Endo/Other  negative endocrine ROS  Renal/GU negative Renal ROS     Musculoskeletal negative musculoskeletal ROS (+)   Abdominal   Peds  Hematology  (+) Blood dyscrasia, anemia , HIV,   Anesthesia Other Findings Day of surgery medications reviewed with the patient.  Reproductive/Obstetrics                             Anesthesia Physical Anesthesia Plan  ASA: III  Anesthesia Plan: MAC   Post-op Pain Management:    Induction: Intravenous  PONV Risk Score and Plan: 1 and Propofol infusion and Treatment may vary due to age or medical condition  Airway Management Planned: Nasal Cannula and Natural Airway  Additional Equipment:   Intra-op Plan:   Post-operative Plan:   Informed Consent: I have reviewed the patients History and Physical, chart, labs and discussed the procedure including the risks, benefits and alternatives for the proposed anesthesia with the patient or authorized  representative who has indicated his/her understanding and acceptance.       Plan Discussed with: CRNA and Anesthesiologist  Anesthesia Plan Comments:         Anesthesia Quick Evaluation

## 2020-10-31 NOTE — Interval H&P Note (Signed)
History and Physical Interval Note:  10/31/2020 8:00 AM  Nicole Mccormick  has presented today for surgery, with the diagnosis of GI bleed.  The various methods of treatment have been discussed with the patient and family. After consideration of risks, benefits and other options for treatment, the patient has consented to  Procedure(s): ENTEROSCOPY (N/A) as a surgical intervention.  The patient's history has been reviewed, patient examined, no change in status, stable for surgery.  I have reviewed the patient's chart and labs.  Questions were answered to the patient's satisfaction.     [name]

## 2020-10-31 NOTE — Discharge Summary (Signed)
Physician Discharge Summary  Ishita Mcnerney GYF:[id number] DOB: 04-Aug-1946 DOA: 10/29/2020  PCP: Sonia Side., FNP  Admit date: 10/29/2020 Discharge date: 10/31/2020  Admitted From: Home  Disposition:  Home   Recommendations for Outpatient Follow-up and new medication changes:  1. Follow up with Dustin Folks FNP in 7 days.  2. Continue holding aspiring for now.   Home Health: no   Equipment/Devices: no   Discharge Condition: stable  CODE STATUS: full  Diet recommendation: heart healthy   Brief/Interim Summary: Mrs. Heyward was admitted to the hospital with a working diagnosis symptomatic anemia due to  acute upper gastrointestinal bleed with acute blood loss anemia and hypovolemic shock.  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 Patient had a recent hospitalization for gastrointestinal bleeding in September 2021, she had extensive work-up including endoscopy, colonoscopy and capsule endoscopy.  Over the last 7 days she reports generalized weakness, dyspnea on exertion with decrease physical functional capacity.  On her initial physical examination her blood pressure was 80/55, heart rate 63, respiratory rate 21, oxygen saturation 100%, temperature 98.7 patient was pale, lungs clear to auscultation bilaterally, heart S1-S2, present rhythmic, soft abdomen, no lower extremity edema.  Sodium 139, potassium 4.0, chloride 107, bicarb 24, glucose 115, BUN 21, creatinine 1.27, white count 4.3, hemoglobin 4.1, hematocrit 15.1, platelets 257.  SARS COVID-19 negative.  Urinalysis 0-5 white cells, 0-5 red cells. EKG 64 bpm, normal axis, normal intervals, atrial pacing, ventricular sensing, no ST segment or T wave changes.  Patient received intravenous fluids and 2 units packed red blood cells with improvement of hemoglobin of 7.7 Blood pressure  improved to 916 mmHg systolic.  Gastroenterology has been consulted and plan for push enteroscopy, showing multiple AVMs in the duodenum and jejunum with one stigmata of bleeding, treated with APC.    1.  Symptomatic anemia due to acute blood loss, upper GI bleed, multiple duodenal and jejunal AVMs.  Hypovolemic shock.  Patient was admitted to the medical ward, she was placed on a remote telemetry monitor, she tolerated well transfusion of 2 units of packed red blood cells.  Aspirin was held, patient received intravenous pantoprazole and underwent push enteroscopy by gastroenterology. Findings of multiple AVMs in the duodenum and jejunum, with stigmata of bleeding, locally treated, APC.  Her discharge hemoglobin is 8.0, hematocrit 26.3.  If patient has ongoing anemia, the recommendation is to follow-up at tertiary care center for double-balloon enteroscopy for treatment of distal AVMs.  2.  Hypertension/dyslipidemia.  Her antihypertensive agents were held during her hospitalization. Continue statin therapy.  At discharge blood pressure 150/60 mmHg, she will resume lisinopril, isosorbide, carvedilol, hctz, spironolactone and amlodipine.   3.  Coronary artery disease.  Complex RCA disease, distal PDA with diffuse disease, medical management recommended, per old record review.  Continue with statin therapy.   Patient remained chest pain-free, considering recurrent gastrointestinal bleeding, will hold aspirin for now.  Follow-up as an outpatient. Continue with isosorbide.   4.  COPD.  No signs of acute exacerbation, continue LAMA, ICS.  5.  HIV.  Continue antiretroviral therapy.  6.  Iron deficiency anemia.  Patient will continue oral iron supplementation.  7.  Depression.  Continue escitalopram and diazepam per home regimen.    Discharge Diagnoses:  Principal Problem:   GI bleed Active Problems:   Human immunodeficiency virus (HIV) disease (HCC)   Gastroesophageal reflux  disease   Essential hypertension, benign   CAD (coronary artery disease)   Hepatitis C   Sick sinus syndrome (HCC)   Severe anemia   COPD (chronic obstructive pulmonary disease) (HCC)   AVM (arteriovenous malformation) of stomach, acquired   Acute blood loss anemia    Discharge Instructions   Allergies as of 10/31/2020      Reactions   Penicillins    REACTION: GOES INTO SHOCK & THEN PASSES OU Has patient had a PCN reaction causing immediate rash, facial/tongue/throat swelling, SOB or lightheadedness with hypotension: NO Has patient had a PCN reaction causing severe rash involving mucus membranes or skin necrosis: NO Has patient had a PCN reaction that required hospitalization NO Has patient had a PCN reaction occurring within the last 10 years: NO If all of the above answers are "NO", then may proceed with Cephalosporin use.   Avelox [moxifloxacin Hcl In Nacl] Itching, Rash      Medication List    STOP taking these medications   aspirin 81 MG EC tablet   triamcinolone 0.1 % Commonly known as: KENALOG     TAKE these medications   acyclovir 400 MG tablet Commonly known as: ZOVIRAX Take 1 tablet (400 mg total) by mouth daily as needed (for flare ups).   albuterol 108 (90 Base) MCG/ACT inhaler Commonly known as: ProAir HFA INHALE 2 PUFFS BY MOUTH EVERY 4 HOURS IF NEEDED FOR WHEEZING OR SHORTNESS OF BREATH What changed:   how much to take  how to take this  when to take this  reasons to take this  additional instructions   ALPRAZolam 0.5 MG tablet Commonly known as: XANAX Take 0.5 mg by mouth 3 (three) times daily as needed for anxiety.   amLODipine 10 MG tablet Commonly known as: NORVASC TAKE 1 TABLET BY MOUTH EVERY DAY   atorvastatin 40 MG tablet Commonly known as: LIPITOR take 1 tablet by mouth once daily AT 6 PM What changed:   how much to take  how to take this  when to take this  additional instructions   Biktarvy 50-200-25 MG Tabs  tablet Generic drug: bictegravir-emtricitabine-tenofovir AF TAKE 1 TABLET BY MOUTH EVERY DAY   carvedilol 3.125 MG tablet Commonly known as: COREG TAKE 1 TABLET(3.125 MG) BY MOUTH TWICE DAILY What changed: See the new instructions.   cetirizine 10 MG tablet Commonly known as: ZYRTEC Take 10 mg by mouth daily. What changed: Another medication with the same name was removed. Continue taking this medication, and follow the directions you see here.   cholecalciferol 1000 units tablet Commonly known as: VITAMIN D Take 1,000 Units by mouth daily.   Delsym 30 MG/5ML liquid Generic drug: dextromethorphan Take 30 mg by mouth as needed for cough.   diazepam 2 MG tablet Commonly known as: VALIUM take 1 tablet by mouth every 6 hours if needed for anxiety What changed:   how much to take  how to take this  when to take this  reasons to take this  additional instructions   escitalopram 10 MG tablet Commonly known as: LEXAPRO Take 10 mg by mouth daily as needed (anxiety).   famotidine 20 MG tablet Commonly known as: PEPCID Take 20 mg by mouth 2 (two) times daily.   ferrous sulfate 325 (65 FE) MG EC tablet Take 1 tablet (325 mg total) by mouth in the morning and at bedtime.   Fish Oil 1000 MG Cpdr Take 1,000 mg by mouth daily.   Flax Seed Oil 1000 MG  Caps Take 1,000 mg by mouth daily.   gabapentin 100 MG capsule Commonly known as: NEURONTIN Take 100 mg by mouth 3 (three) times daily.   hydrochlorothiazide 25 MG tablet Commonly known as: HYDRODIURIL Take 25 mg by mouth daily.   isosorbide mononitrate 60 MG 24 hr tablet Commonly known as: IMDUR Take 1 tablet (60 mg total) by mouth daily.   lisinopril 40 MG tablet Commonly known as: ZESTRIL Take 40 mg by mouth daily.   meclizine 25 MG tablet Commonly known as: ANTIVERT Take 25 mg by mouth as needed for dizziness.   pantoprazole 40 MG tablet Commonly known as: PROTONIX Take 1 tablet (40 mg total) by mouth  daily.   spironolactone 25 MG tablet Commonly known as: ALDACTONE TAKE 1 TABLET BY MOUTH EVERY DAY   Symbicort 160-4.5 MCG/ACT inhaler Generic drug: budesonide-formoterol Inhale 2 puffs into the lungs 2 (two) times daily.   vitamin A 10000 UNIT capsule Take 10,000 Units by mouth daily.   vitamin B-12 1000 MCG tablet Commonly known as: CYANOCOBALAMIN Take 1,000 mcg by mouth daily.   vitamin C 1000 MG tablet Take 1,000 mg by mouth daily.       Follow-up Information    Sonia Side., FNP Follow up in 1 week(s).   Specialty: Family Medicine Contact information: [place] [address] [phone]              Allergies  Allergen Reactions  . Penicillins     REACTION: GOES INTO SHOCK & THEN PASSES OU Has patient had a PCN reaction causing immediate rash, facial/tongue/throat swelling, SOB or lightheadedness with hypotension: NO Has patient had a PCN reaction causing severe rash involving mucus membranes or skin necrosis: NO Has patient had a PCN reaction that required hospitalization NO Has patient had a PCN reaction occurring within the last 10 years: NO If all of the above answers are "NO", then may proceed with Cephalosporin use.   . Avelox [Moxifloxacin Hcl In Nacl] Itching and Rash    Consultations:  GI    Procedures/Studies:  No results found.   Procedures: Push enteroscopy   Subjective: Patient is feeling better, no nausea or vomiting, no melena or hematochezia, no hematemesis, no chest pain or dyspnea.    Discharge Exam: Vitals:   10/31/20 0920 10/31/20 0935  BP: (!) 147/63 (!) 150/60  Pulse: 60 60  Resp: 18 18  Temp:  97.6 F (36.4 C)  SpO2: 99%    Vitals:   10/31/20 0900 10/31/20 0910 10/31/20 0920 10/31/20 0935  BP: (!) 99/59 134/63 (!) 147/63 (!) 150/60  Pulse: 68 64 60 60  Resp: 14 14 18 18   Temp: (!) 97.5 F (36.4 C)   97.6 F (36.4 C)  TempSrc: Oral   Oral  SpO2: 100% 99% 99%     General: Not in pain or  dyspnea  Neurology: Awake and alert, non focal  E ENT: mild pallor, no icterus, oral mucosa moist Cardiovascular: No JVD. S1-S2 present, rhythmic, no gallops, rubs, or murmurs. No lower extremity edema. Pulmonary: positive breath sounds bilaterally, adequate air movement, no wheezing, rhonchi or rales. Gastrointestinal. Abdomen soft and non tender Skin. No rashes Musculoskeletal: no joint deformities   The results of significant diagnostics from this hospitalization (including imaging, microbiology, ancillary and laboratory) are listed below for reference.     Microbiology: Recent Results (from the past 240 hour(s))  Respiratory Panel by RT PCR (Flu A&B, Covid) - Nasopharyngeal Swab  Status: None   Collection Time: 10/29/20  2:55 PM   Specimen: Nasopharyngeal Swab; Nasopharyngeal(NP) swabs in vial transport medium  Result Value Ref Range Status   SARS Coronavirus 2 by RT PCR NEGATIVE NEGATIVE Final    Comment: (NOTE) SARS-CoV-2 target nucleic acids are NOT DETECTED.  The SARS-CoV-2 RNA is generally detectable in upper respiratoy specimens during the acute phase of infection. The lowest concentration of SARS-CoV-2 viral copies this assay can detect is 131 copies/mL. A negative result does not preclude SARS-Cov-2 infection and should not be used as the sole basis for treatment or other patient management decisions. A negative result may occur with  improper specimen collection/handling, submission of specimen other than nasopharyngeal swab, presence of viral mutation(s) within the areas targeted by this assay, and inadequate number of viral copies (<131 copies/mL). A negative result must be combined with clinical observations, patient history, and epidemiological information. The expected result is Negative.  Fact Sheet for Patients:  PinkCheek.be  Fact Sheet for Healthcare Providers:  GravelBags.it  This test is no t  yet approved or cleared by the [place] FDA and  has been authorized for detection and/or diagnosis of SARS-CoV-2 by FDA under an Emergency Use Authorization (EUA). This EUA will remain  in effect (meaning this test can be used) for the duration of the COVID-19 declaration under Section 564(b)(1) of the Act, 21 U.S.C. section 360bbb-3(b)(1), unless the authorization is terminated or revoked sooner.     Influenza A by PCR NEGATIVE NEGATIVE Final   Influenza B by PCR NEGATIVE NEGATIVE Final    Comment: (NOTE) The Xpert Xpress SARS-CoV-2/FLU/RSV assay is intended as an aid in  the diagnosis of influenza from Nasopharyngeal swab specimens and  should not be used as a sole basis for treatment. Nasal washings and  aspirates are unacceptable for Xpert Xpress SARS-CoV-2/FLU/RSV  testing.  Fact Sheet for Patients: PinkCheek.be  Fact Sheet for Healthcare Providers: GravelBags.it  This test is not yet approved or cleared by the [place] FDA and  has been authorized for detection and/or diagnosis of SARS-CoV-2 by  FDA under an Emergency Use Authorization (EUA). This EUA will remain  in effect (meaning this test can be used) for the duration of the  Covid-19 declaration under Section 564(b)(1) of the Act, 21  U.S.C. section 360bbb-3(b)(1), unless the authorization is  terminated or revoked. Performed at [facility], [place] [address] [place], [place] [address]      Labs: BNP (last 3 results) No results for input(s): BNP in the last 8760 hours. Basic Metabolic Panel: Recent Labs  Lab 10/29/20 1254  NA 139  K 4.0  CL 107  CO2 24  GLUCOSE 115*  BUN 21  CREATININE 1.27*  CALCIUM 8.6*   Liver Function Tests: No results for input(s): AST, ALT, ALKPHOS, BILITOT, PROT, ALBUMIN in the last 168 hours. No results for input(s): LIPASE, AMYLASE in the last 168 hours. No results for input(s): AMMONIA in the last 168  hours. CBC: Recent Labs  Lab 10/29/20 1254 10/30/20 0447 10/31/20 0222  WBC 4.3  --  3.9*  HGB 4.1* 7.7* 8.0*  HCT 15.1* 24.9* 26.3*  MCV 102.7*  --  92.6  PLT 257  --  209   Cardiac Enzymes: No results for input(s): CKTOTAL, CKMB, CKMBINDEX, TROPONINI in the last 168 hours. BNP: Invalid input(s): POCBNP CBG: Recent Labs  Lab 10/29/20 1329  GLUCAP 99   D-Dimer No results for input(s): DDIMER in the last 72 hours. Hgb  A1c No results for input(s): HGBA1C in the last 72 hours. Lipid Profile No results for input(s): CHOL, HDL, LDLCALC, TRIG, CHOLHDL, LDLDIRECT in the last 72 hours. Thyroid function studies No results for input(s): TSH, T4TOTAL, T3FREE, THYROIDAB in the last 72 hours.  Invalid input(s): FREET3 Anemia work up No results for input(s): VITAMINB12, FOLATE, FERRITIN, TIBC, IRON, RETICCTPCT in the last 72 hours. Urinalysis    Component Value Date/Time   COLORURINE YELLOW 10/29/2020 0043   APPEARANCEUR CLEAR 10/29/2020 0043   LABSPEC 1.014 10/29/2020 0043   PHURINE 6.0 10/29/2020 0043   GLUCOSEU NEGATIVE 10/29/2020 0043   HGBUR NEGATIVE 10/29/2020 0043   BILIRUBINUR NEGATIVE 10/29/2020 0043   KETONESUR NEGATIVE 10/29/2020 0043   PROTEINUR NEGATIVE 10/29/2020 0043   UROBILINOGEN 0.2 02/12/2015 1030   NITRITE NEGATIVE 10/29/2020 0043   LEUKOCYTESUR NEGATIVE 10/29/2020 0043   Sepsis Labs Invalid input(s): PROCALCITONIN,  WBC,  LACTICIDVEN Microbiology Recent Results (from the past 240 hour(s))  Respiratory Panel by RT PCR (Flu A&B, Covid) - Nasopharyngeal Swab     Status: None   Collection Time: 10/29/20  2:55 PM   Specimen: Nasopharyngeal Swab; Nasopharyngeal(NP) swabs in vial transport medium  Result Value Ref Range Status   SARS Coronavirus 2 by RT PCR NEGATIVE NEGATIVE Final    Comment: (NOTE) SARS-CoV-2 target nucleic acids are NOT DETECTED.  The SARS-CoV-2 RNA is generally detectable in upper respiratoy specimens during the acute phase of  infection. The lowest concentration of SARS-CoV-2 viral copies this assay can detect is 131 copies/mL. A negative result does not preclude SARS-Cov-2 infection and should not be used as the sole basis for treatment or other patient management decisions. A negative result may occur with  improper specimen collection/handling, submission of specimen other than nasopharyngeal swab, presence of viral mutation(s) within the areas targeted by this assay, and inadequate number of viral copies (<131 copies/mL). A negative result must be combined with clinical observations, patient history, and epidemiological information. The expected result is Negative.  Fact Sheet for Patients:  PinkCheek.be  Fact Sheet for Healthcare Providers:  GravelBags.it  This test is no t yet approved or cleared by the [place] FDA and  has been authorized for detection and/or diagnosis of SARS-CoV-2 by FDA under an Emergency Use Authorization (EUA). This EUA will remain  in effect (meaning this test can be used) for the duration of the COVID-19 declaration under Section 564(b)(1) of the Act, 21 U.S.C. section 360bbb-3(b)(1), unless the authorization is terminated or revoked sooner.     Influenza A by PCR NEGATIVE NEGATIVE Final   Influenza B by PCR NEGATIVE NEGATIVE Final    Comment: (NOTE) The Xpert Xpress SARS-CoV-2/FLU/RSV assay is intended as an aid in  the diagnosis of influenza from Nasopharyngeal swab specimens and  should not be used as a sole basis for treatment. Nasal washings and  aspirates are unacceptable for Xpert Xpress SARS-CoV-2/FLU/RSV  testing.  Fact Sheet for Patients: PinkCheek.be  Fact Sheet for Healthcare Providers: GravelBags.it  This test is not yet approved or cleared by the [place] FDA and  has been authorized for detection and/or diagnosis of SARS-CoV-2  by  FDA under an Emergency Use Authorization (EUA). This EUA will remain  in effect (meaning this test can be used) for the duration of the  Covid-19 declaration under Section 564(b)(1) of the Act, 21  U.S.C. section 360bbb-3(b)(1), unless the authorization is  terminated or revoked. Performed at [facility], [place] [address] [place], [place] [address]  Time coordinating discharge: 45 minutes  SIGNED:   Tawni Millers, MD  [facility] 10/31/2020, 10:19 AM

## 2020-10-31 NOTE — Progress Notes (Signed)
Discharge reviewed with patient, expressed understanding, completed teach back. IV removal well tolerated. Patient to be transported by family member a family car home.

## 2020-11-01 ENCOUNTER — Encounter (HOSPITAL_COMMUNITY): Payer: Self-pay | Admitting: Gastroenterology

## 2020-11-01 NOTE — Anesthesia Postprocedure Evaluation (Signed)
Anesthesia Post Note  Patient: Nicole Mccormick  Procedure(s) Performed: ENTEROSCOPY (N/A ) HOT HEMOSTASIS (ARGON PLASMA COAGULATION/BICAP) (N/A )     Patient location during evaluation: Endoscopy Anesthesia Type: MAC Level of consciousness: awake and alert Pain management: pain level controlled Vital Signs Assessment: post-procedure vital signs reviewed and stable Respiratory status: spontaneous breathing, nonlabored ventilation, respiratory function stable and patient connected to nasal cannula oxygen Cardiovascular status: stable and blood pressure returned to baseline Postop Assessment: no apparent nausea or vomiting Anesthetic complications: no   No complications documented.  Last Vitals:  Vitals:   10/31/20 0935 10/31/20 1100  BP: (!) 150/60 (!) 130/58  Pulse: 60 79  Resp: 18 18  Temp: 36.4 C 36.4 C  SpO2:  95%    Last Pain:  Vitals:   10/31/20 0935  TempSrc: Oral  PainSc:                  Catalina Gravel

## 2020-11-13 ENCOUNTER — Telehealth: Payer: Self-pay | Admitting: *Deleted

## 2020-11-13 NOTE — Telephone Encounter (Signed)
Patient needs appropriate diagnosis added to problem list for ensure prescription via [facility]. Landis Gandy, RN

## 2020-11-14 ENCOUNTER — Encounter: Payer: Self-pay | Admitting: Infectious Diseases

## 2020-11-14 DIAGNOSIS — E43 Unspecified severe protein-calorie malnutrition: Secondary | ICD-10-CM | POA: Insufficient documentation

## 2020-11-14 NOTE — Telephone Encounter (Signed)
Unspecified severe protein calorie malnutrition

## 2020-11-14 NOTE — Telephone Encounter (Signed)
Thank you :)

## 2020-11-15 NOTE — Telephone Encounter (Signed)
Thank you Mrs Nicole Mccormick Thank you for your commitment to excellence and service to our patients.

## 2020-11-22 ENCOUNTER — Ambulatory Visit (INDEPENDENT_AMBULATORY_CARE_PROVIDER_SITE_OTHER): Payer: 59

## 2020-11-22 DIAGNOSIS — R001 Bradycardia, unspecified: Secondary | ICD-10-CM

## 2020-11-22 DIAGNOSIS — I495 Sick sinus syndrome: Secondary | ICD-10-CM

## 2020-11-22 LAB — CUP PACEART REMOTE DEVICE CHECK
Battery Remaining Longevity: 121 mo
Battery Remaining Percentage: 95.5 %
Battery Voltage: 3.01 V
Brady Statistic AP VP Percent: 1.4 %
Brady Statistic AP VS Percent: 92 %
Brady Statistic AS VP Percent: 1 %
Brady Statistic AS VS Percent: 6.2 %
Brady Statistic RA Percent Paced: 92 %
Brady Statistic RV Percent Paced: 1.4 %
Date Time Interrogation Session: 20211215020015
Implantable Lead Implant Date: 20190228
Implantable Lead Implant Date: 20190228
Implantable Lead Location: 753859
Implantable Lead Location: 753860
Implantable Pulse Generator Implant Date: 20190228
Lead Channel Impedance Value: 330 Ohm
Lead Channel Impedance Value: 440 Ohm
Lead Channel Pacing Threshold Amplitude: 0.5 V
Lead Channel Pacing Threshold Amplitude: 0.625 V
Lead Channel Pacing Threshold Pulse Width: 0.5 ms
Lead Channel Pacing Threshold Pulse Width: 0.5 ms
Lead Channel Sensing Intrinsic Amplitude: 3.2 mV
Lead Channel Sensing Intrinsic Amplitude: 8.7 mV
Lead Channel Setting Pacing Amplitude: 0.875
Lead Channel Setting Pacing Amplitude: 1.5 V
Lead Channel Setting Pacing Pulse Width: 0.5 ms
Lead Channel Setting Sensing Sensitivity: 2 mV
Pulse Gen Model: 2272
Pulse Gen Serial Number: 8996997

## 2020-12-04 ENCOUNTER — Telehealth: Payer: Self-pay

## 2020-12-04 NOTE — Telephone Encounter (Signed)
Merlin alert for AF episode. Unclear duration, began 12/25. Not in AF on presenting rhythm.   Episode reviewed, appears to be approx 1 day, 10hours starting on 12/25.  Pt meds include carvedilol 3.125mg  BID  Spoke with pt she reports feeling fatigued on 12/25, she had thought it was due to iron levels or increased holiday activities.  She reports she does have a history of AF, not currently taking any OAC.  She is also not on ASA due anemia.    Discussed this current episode and pt agreeable with AF clinic consult due to symptomatic nature and length of episode.   '

## 2020-12-05 NOTE — Telephone Encounter (Signed)
Reviewed chart with Jorja Loa PA recommends pt follow up with Dr. Johney Frame in the next week or so.

## 2020-12-05 NOTE — Telephone Encounter (Signed)
Called patient to follow up. Advised scheduling contact her in regards to an apt. With Dr. Johney Frame. Patient agreeable. States she feels great right now. Advised to call with any changes.

## 2020-12-06 NOTE — Progress Notes (Signed)
Remote pacemaker transmission.   

## 2020-12-29 ENCOUNTER — Encounter: Payer: 59 | Admitting: Internal Medicine

## 2021-01-01 ENCOUNTER — Telehealth: Payer: Self-pay | Admitting: Hematology and Oncology

## 2021-01-01 NOTE — Telephone Encounter (Signed)
Received a new pt referral from Dr. Tamala Julian at [facility] for anemia. Ms. hor has been cld and scheduled to see Dr. Lorenso Courier on 1/26 at 9am. Pt aware toa rrive 20 minutes early.

## 2021-01-02 NOTE — Progress Notes (Signed)
[place] [phone]   Fax:([phone]) [place] NOTE  Patient Care Team: Sonia Side., FNP as PCP - General (Family Medicine) Johnnye Sima Doroteo Bradford, MD as PCP - Infectious Diseases (Infectious Diseases) Dorothy Spark, MD as PCP - Cardiology (Cardiology) Thompson Grayer, MD as PCP - Electrophysiology (Cardiology) Arta Silence, MD as Consulting Physician (Gastroenterology)  Hematological/Oncological History # Iron Deficiency Anemia 2/2 to GI Bleed  10/29/2020: patient presented the ED with weakness and was found to have a Hgb of 4.1 11/01/2020: GI evaluation showed numerous bleeding/nonbleeding AVMS. Cauterization performed.  12/18/2020: Iron 32, TIBC 441, Sat 7%, Ferritin 31. Hgb 8.3, Plt 313, WBC 4.2, MCV 102.5.  01/03/2021: establish care with Dr. Lorenso Courier   CHIEF COMPLAINTS/PURPOSE OF CONSULTATION:  " Iron Deficiency Anemia 2/2 to GI Bleed  "  HISTORY OF PRESENTING ILLNESS:  Nicole Mccormick 75 y.o. female with medical history significant for GERD, HIV infection (on therapy), asthma, aortic stenosis, and CAD with pacemaker who presents for evaluation of iron deficiency anemia 2/2 to GI bleeding.  On review of the previous records Nicole Mccormick presented the emergency department on 10/29/2020 with weakness and was found to have a hemoglobin of 4.1.  As part of her evaluation GI was consulted and performed a EGD.  She was found to have numerous nonbleeding AVMs in the duodenum and jejunum, with 1 bleeding AVM in the jejunum.  She had APC performed in order to cauterize these lesions.  She was placed on p.o. iron therapy and has been taking iron twice daily since that time.  She was recently seen by her primary care provider on 12/18/2020 and found to have a hemoglobin 8.3, iron level of 32, iron sat of 7%, and a ferritin of 31.  Due to concern for her continued iron deficiency anemia she was referred to hematology for further evaluation and  management.  On exam today Nicole Mccormick notes that she can feel when her iron levels are low and right now she feels like she is "right above the borderline".  She notes that the primary symptom she has been experiencing are fatigue and occasional shortness of breath.  She is also been having cravings for ice.  She says sometimes that she feels "so tired that she cannot move".  She has been taking iron pills twice daily for at least the last 6 weeks.  She denies having any constipation, but does endorse having dark stools as a result of taking iron pills.  She not have any overt blood in her stool or dark stools prior to the start of that therapy.  On further discussion she notes that she eats an unrestricted diet that includes iron rich foods including meats, grains, and beef.  She reports that she "loves beef" and that she eats about 1-2 servings per week.  She also denies having any bleeding elsewhere in her body such as nosebleeds, gum bleeding, or bruising.  Her family history is not remarkable for any blood diseases or blood disorders.  She denies any fevers, chills, sweats, nausea, vomiting or diarrhea.  A full 10 point ROS is listed below  MEDICAL HISTORY:  Past Medical History:  Diagnosis Date  . Abnormal Pap smear   . Aortic stenosis   . Arthritis   . Asthma   . AVM (arteriovenous malformation)   . Bradycardia   . Complication of anesthesia    "they have a hard time bringing me back" (11/08/2015)  . Coronary artery disease  a. diffuse CAD of RCA by cath 2016 managed medically.  . Dynamic left ventricular outflow obstruction   . GERD (gastroesophageal reflux disease)    previously on aciphex, discontinued november 2012  because patient asymptomatic, and concern about interference with HIV meds.  May try pepcid in the future if symptoms return  . Heart murmur   . Hepatitis C antibody test positive   . HIV infection ([place])    diagnosed before 2008  . Hypertension   . Hypertensive  heart disease   . Iron deficiency anemia    Ferritin = 2 in november 2012, started on iron supplemenation  . Pacemaker    a. St Jude PPM 01/2018.  . Seizures ([place])   . Symptomatic anemia 08/24/2020  . Varicosities     SURGICAL HISTORY: Past Surgical History:  Procedure Laterality Date  . ABDOMINAL HYSTERECTOMY    . CARDIAC CATHETERIZATION N/A 11/08/2015   Procedure: Left Heart Cath and Coronary Angiography;  Surgeon: Jettie Booze, MD;  Location: [facility];  Service: Cardiovascular;  Laterality: N/A;  . COLONOSCOPY  10/13/11   small adenoma, anal condyloma  . COLONOSCOPY  10/13/2011   Procedure: COLONOSCOPY;  Surgeon: Gatha Mayer, MD;  Location: [facility];  Service: Endoscopy;  Laterality: N/A;  . COLONOSCOPY N/A 09/14/2014   Procedure: COLONOSCOPY;  Surgeon: Arta Silence, MD;  Location: [facility] ENDOSCOPY;  Service: Endoscopy;  Laterality: N/A;  . COLONOSCOPY WITH PROPOFOL N/A 06/05/2020   Procedure: COLONOSCOPY WITH PROPOFOL;  Surgeon: Arta Silence, MD;  Location: [facility];  Service: Endoscopy;  Laterality: N/A;  . ENTEROSCOPY N/A 10/31/2020   Procedure: ENTEROSCOPY;  Surgeon: Otis Brace, MD;  Location: [facility];  Service: Gastroenterology;  Laterality: N/A;  . ESOPHAGOGASTRODUODENOSCOPY  10/13/11   small hiatal hernia  . ESOPHAGOGASTRODUODENOSCOPY  10/13/2011   Procedure: ESOPHAGOGASTRODUODENOSCOPY (EGD);  Surgeon: Gatha Mayer, MD;  Location[facility] ENDOSCOPY;  Service: Endoscopy;  Laterality: N/A;  . ESOPHAGOGASTRODUODENOSCOPY N/A 09/14/2014   Procedure: ESOPHAGOGASTRODUODENOSCOPY (EGD);  Surgeon: Arta Silence, MD;  Location: [facility] ENDOSCOPY;  Service: Endoscopy;  Laterality: N/A;  . ESOPHAGOGASTRODUODENOSCOPY (EGD) WITH PROPOFOL N/A 06/05/2020   Procedure: ESOPHAGOGASTRODUODENOSCOPY (EGD) WITH PROPOFOL;  Surgeon: Arta Silence, MD;  Location: [facility];  Service: Endoscopy;  Laterality: N/A;  . GIVENS CAPSULE STUDY N/A 09/14/2014   Procedure: GIVENS  CAPSULE STUDY;  Surgeon: Arta Silence, MD;  Location: [facility] ENDOSCOPY;  Service: Endoscopy;  Laterality: N/A;  . GIVENS CAPSULE STUDY N/A 08/26/2020   Procedure: GIVENS CAPSULE STUDY;  Surgeon: Wonda Horner, MD;  Location: [facility] ENDOSCOPY;  Service: Endoscopy;  Laterality: N/A;  . HOT HEMOSTASIS N/A 10/31/2020   Procedure: HOT HEMOSTASIS (ARGON PLASMA COAGULATION/BICAP);  Surgeon: Otis Brace, MD;  Location: [facility] ENDOSCOPY;  Service: Gastroenterology;  Laterality: N/A;  . PACEMAKER IMPLANT N/A 02/05/2018   Procedure: PACEMAKER IMPLANT;  Surgeon: Thompson Grayer, MD;  Location: [facility];  Service: Cardiovascular;  Laterality: N/A;  . POLYPECTOMY  06/05/2020   Procedure: POLYPECTOMY;  Surgeon: Arta Silence, MD;  Location: [facility] ENDOSCOPY;  Service: Endoscopy;;    SOCIAL HISTORY: Social History   Socioeconomic History  . Marital status: Married    Spouse name: Not on file  . Number of children: 6  . Years of education: Not on file  . Highest education level: Not on file  Occupational History  . Occupation: retired  Tobacco Use  . Smoking status: Current Every Day Smoker    Packs/day: 1.00    Years: 60.00    Pack  years: 60.00    Types: Cigarettes  . Smokeless tobacco: Never Used  . Tobacco comment: Wants to quit. Using both the patch and gum.  Vaping Use  . Vaping Use: Never used  Substance and Sexual Activity  . Alcohol use: Yes    Alcohol/week: 10.0 standard drinks    Types: 10 Glasses of wine per week    Comment: drinking less recently, denies h/o heavy use  . Drug use: No  . Sexual activity: Yes    Partners: Male    Birth control/protection: Surgical    Comment: pt. given condoms 08/23/20  Other Topics Concern  . Not on file  Social History Narrative  . Not on file   Social Determinants of Health   Financial Resource Strain: Not on file  Food Insecurity: Not on file  Transportation Needs: Not on file  Physical Activity: Not on file  Stress: Not on file   Social Connections: Not on file  Intimate Partner Violence: Not on file    FAMILY HISTORY: Family History  Problem Relation Age of Onset  . Diabetes Mother   . Ovarian cancer Sister   . Colon cancer Sister     ALLERGIES:  is allergic to penicillins, penicillin g, and avelox [moxifloxacin hcl in nacl].  MEDICATIONS:  Current Outpatient Medications  Medication Sig Dispense Refill  . Polyethylene Glycol 1000 POWD Mix 17g (1 capful) in a drink    . acyclovir (ZOVIRAX) 400 MG tablet Take 1 tablet (400 mg total) by mouth daily as needed (for flare ups). 30 tablet 3  . albuterol (PROAIR HFA) 108 (90 Base) MCG/ACT inhaler INHALE 2 PUFFS BY MOUTH EVERY 4 HOURS IF NEEDED FOR WHEEZING OR SHORTNESS OF BREATH (Patient taking differently: Inhale 2 puffs into the lungs every 4 (four) hours as needed for wheezing or shortness of breath. ) 8.5 g 0  . ALPRAZolam (XANAX) 0.5 MG tablet Take 0.5 mg by mouth 3 (three) times daily as needed for anxiety.    Marland Kitchen amLODipine (NORVASC) 10 MG tablet TAKE 1 TABLET BY MOUTH EVERY DAY (Patient taking differently: Take 10 mg by mouth daily. ) 90 tablet 0  . Ascorbic Acid (VITAMIN C) 1000 MG tablet Take 1,000 mg by mouth daily.    Marland Kitchen atorvastatin (LIPITOR) 40 MG tablet take 1 tablet by mouth once daily AT 6 PM (Patient taking differently: Take 40 mg by mouth every evening. ) 90 tablet 0  . BIKTARVY 50-200-25 MG TABS tablet TAKE 1 TABLET BY MOUTH EVERY DAY (Patient taking differently: Take 1 tablet by mouth daily. ) 90 tablet 3  . carvedilol (COREG) 3.125 MG tablet TAKE 1 TABLET(3.125 MG) BY MOUTH TWICE DAILY (Patient taking differently: Take 3.125 mg by mouth 2 (two) times daily with a meal. ) 180 tablet 3  . cetirizine (ZYRTEC) 10 MG tablet Take 10 mg by mouth daily.    . cholecalciferol (VITAMIN D) 1000 units tablet Take 1,000 Units by mouth daily.    . cyclobenzaprine (FLEXERIL) 10 MG tablet 1 tablet    . dextromethorphan (DELSYM) 30 MG/5ML liquid Take 30 mg by mouth  as needed for cough.     . diazepam (VALIUM) 2 MG tablet take 1 tablet by mouth every 6 hours if needed for anxiety (Patient taking differently: Take 2 mg by mouth as needed for anxiety. ) 30 tablet 0  . escitalopram (LEXAPRO) 10 MG tablet Take 10 mg by mouth daily as needed (anxiety).     . famotidine (PEPCID) 20 MG tablet  Take 20 mg by mouth 2 (two) times daily.    . ferrous sulfate 325 (65 FE) MG EC tablet Take 1 tablet (325 mg total) by mouth in the morning and at bedtime. 180 tablet 0  . Flaxseed, Linseed, (FLAX SEED OIL) 1000 MG CAPS Take 1,000 mg by mouth daily.    Marland Kitchen gabapentin (NEURONTIN) 100 MG capsule Take 100 mg by mouth 3 (three) times daily.    Marland Kitchen gabapentin (NEURONTIN) 300 MG capsule     . hydrochlorothiazide (HYDRODIURIL) 25 MG tablet Take 25 mg by mouth daily.     Marland Kitchen HYDROcodone-acetaminophen (NORCO/VICODIN) 5-325 MG tablet Take 1 tablet by mouth 2 (two) times daily as needed.    . isosorbide mononitrate (IMDUR) 60 MG 24 hr tablet Take 1 tablet (60 mg total) by mouth daily. 90 tablet 1  . lisinopril (ZESTRIL) 40 MG tablet Take 40 mg by mouth daily.    . meclizine (ANTIVERT) 25 MG tablet Take 25 mg by mouth as needed for dizziness.     . Nutritional Supplements (ENSURE ORIGINAL PO) See admin instructions.    . Omega-3 Fatty Acids (FISH OIL) 1000 MG CPDR Take 1,000 mg by mouth daily.    . ondansetron (ZOFRAN ODT) 4 MG disintegrating tablet 1 tablet on the tongue and allow to dissolve    . pantoprazole (PROTONIX) 40 MG tablet Take 1 tablet (40 mg total) by mouth daily. 90 tablet 0  . spironolactone (ALDACTONE) 25 MG tablet TAKE 1 TABLET BY MOUTH EVERY DAY (Patient taking differently: Take 25 mg by mouth daily. ) 30 tablet 0  . SYMBICORT 160-4.5 MCG/ACT inhaler Inhale 2 puffs into the lungs 2 (two) times daily.    . vitamin A 10000 UNIT capsule Take 10,000 Units by mouth daily.    . vitamin B-12 (CYANOCOBALAMIN) 1000 MCG tablet Take 1,000 mcg by mouth daily.     No current  facility-administered medications for this visit.    REVIEW OF SYSTEMS:   Constitutional: ( - ) fevers, ( - )  chills , ( - ) night sweats Eyes: ( - ) blurriness of vision, ( - ) double vision, ( - ) watery eyes Ears, nose, mouth, throat, and face: ( - ) mucositis, ( - ) sore throat Respiratory: ( - ) cough, ( - ) dyspnea, ( - ) wheezes Cardiovascular: ( - ) palpitation, ( - ) chest discomfort, ( - ) lower extremity swelling Gastrointestinal:  ( - ) nausea, ( - ) heartburn, ( - ) change in bowel habits Skin: ( - ) abnormal skin rashes Lymphatics: ( - ) new lymphadenopathy, ( - ) easy bruising Neurological: ( - ) numbness, ( - ) tingling, ( - ) new weaknesses Behavioral/Psych: ( - ) mood change, ( - ) new changes  All other systems were reviewed with the patient and are negative.  PHYSICAL EXAMINATION: ECOG PERFORMANCE STATUS: 1 - Symptomatic but completely ambulatory  Vitals:   01/03/21 0857 01/03/21 0859  BP: 112/68 124/66  Pulse: 81   Resp: 17   Temp: (!) 96.2 F (35.7 C)   SpO2: 100%    Filed Weights   01/03/21 0857  Weight: 129 lb 14.4 oz (58.9 kg)    GENERAL: well appearing elderly African American female in NAD  SKIN: skin color, texture, turgor are normal, no rashes or significant lesions EYES: conjunctiva are pink and non-injected, sclera clear LUNGS: clear to auscultation and percussion with normal breathing effort HEART: regular rate & rhythm and no murmurs and  no lower extremity edema Musculoskeletal: no cyanosis of digits and no clubbing  PSYCH: alert & oriented x 3, fluent speech NEURO: no focal motor/sensory deficits  LABORATORY DATA:  I have reviewed the data as listed CBC Latest Ref Rng & Units 01/03/2021 10/31/2020 10/30/2020  WBC 4.0 - 10.5 K/uL 4.2 3.9(L) -  Hemoglobin 12.0 - 15.0 g/dL 8.3(L) 8.0(L) 7.7(L)  Hematocrit 36.0 - 46.0 % 28.8(L) 26.3(L) 24.9(L)  Platelets 150 - 400 K/uL 257 209 -    CMP Latest Ref Rng & Units 10/29/2020 08/26/2020  08/25/2020  Glucose 70 - 99 mg/dL 115(H) 103(H) 126(H)  BUN 8 - 23 mg/dL 21 13 15   Creatinine 0.44 - 1.00 mg/dL 1.27(H) 0.93 0.76  Sodium 135 - 145 mmol/L 139 140 140  Potassium 3.5 - 5.1 mmol/L 4.0 3.9 3.7  Chloride 98 - 111 mmol/L 107 107 110  CO2 22 - 32 mmol/L 24 27 24   Calcium 8.9 - 10.3 mg/dL 8.6(L) 9.0 8.4(L)  Total Protein 6.1 - 8.1 g/dL - - -  Total Bilirubin 0.2 - 1.2 mg/dL - - -  Alkaline Phos 38 - 126 U/L - - -  AST 10 - 35 U/L - - -  ALT 6 - 29 U/L - - -    RADIOGRAPHIC STUDIES: No results found.  ASSESSMENT & PLAN Dann Magistro 75 y.o. female with medical history significant for GERD, HIV infection (on therapy), asthma, aortic stenosis, and CAD with pacemaker who presents for evaluation of iron deficiency anemia 2/2 to GI bleeding.   Review the labs, the records, schedule the patient the findings most consistent with an iron deficiency anemia secondary to GI bleeding.  The patient has a known source of GI bleeds with AVMs noted in the duodenum and jejunum.  These were treated by Dr. Penelope Coop in November 2021.  In his note he reported that if she continues to have GI bleeding they would need to consider double-balloon enteroscopy at [facility] today will be to set the patient up for iron repletion with IV Feraheme in the event that her iron levels continue to trend down we could reconnect with GI and discuss sending the patient off for more advanced endoscopic intervention.  In the interim I would recommend that Ms. Pizano continues to take her p.o. iron therapy once daily with a source of vitamin C.  We will plan to treat her with IV Feraheme 510 mg q. 7 days x 2 doses and have her return approximately 4 to 6 weeks later in order to reassess.  The patient voiced understanding of this plan moving forward  # Iron Deficiency Anemia 2/2 to GI Bleed   -- findings are consistent with persistent iron deficiency anemia despite adequate PO iron therapy --will plan to  administer 510mg  IV feraheme q 7 days x 2 doses --today will order repeat iron panel, ferritin, CBC, CMP and reticulocyte panel  --continue PO ferrous sulfate 325mg  daily in the interim. Take with a source of Vitamin C (orange juice best)  --RTC for IV iron with f/u 4-6 weeks after to reassess.   Orders Placed This Encounter  Procedures  . CBC with Differential (Cancer Center Only)    Standing Status:   Future    Number of Occurrences:   1    Standing Expiration Date:   01/03/2022  . Retic Panel    Standing Status:   Future    Number of Occurrences:   1    Standing Expiration Date:  01/03/2022  . CMP ([facility] only)    Standing Status:   Future    Number of Occurrences:   1    Standing Expiration Date:   01/03/2022  . Ferritin    Standing Status:   Future    Number of Occurrences:   1    Standing Expiration Date:   01/03/2022  . Iron and TIBC    Standing Status:   Future    Number of Occurrences:   1    Standing Expiration Date:   01/03/2022    All questions were answered. The patient knows to call the clinic with any problems, questions or concerns.  A total of more than 60 minutes were spent on this encounter and over half of that time was spent on counseling and coordination of care as outlined above.   Ledell Peoples, MD Department of Hematology/Oncology [place] at [facility] Phone: [phone] Pager: [phone] Email: [email or web address]  01/03/2021 10:22 AM

## 2021-01-03 ENCOUNTER — Encounter: Payer: Self-pay | Admitting: Hematology and Oncology

## 2021-01-03 ENCOUNTER — Other Ambulatory Visit: Payer: Self-pay

## 2021-01-03 ENCOUNTER — Inpatient Hospital Stay: Payer: 59 | Attending: Hematology and Oncology | Admitting: Hematology and Oncology

## 2021-01-03 ENCOUNTER — Inpatient Hospital Stay: Payer: 59

## 2021-01-03 VITALS — BP 124/66 | HR 81 | Temp 96.2°F | Resp 17 | Ht 62.0 in | Wt 129.9 lb

## 2021-01-03 DIAGNOSIS — Z79899 Other long term (current) drug therapy: Secondary | ICD-10-CM | POA: Diagnosis not present

## 2021-01-03 DIAGNOSIS — D500 Iron deficiency anemia secondary to blood loss (chronic): Secondary | ICD-10-CM

## 2021-01-03 DIAGNOSIS — K219 Gastro-esophageal reflux disease without esophagitis: Secondary | ICD-10-CM | POA: Insufficient documentation

## 2021-01-03 DIAGNOSIS — R011 Cardiac murmur, unspecified: Secondary | ICD-10-CM | POA: Diagnosis not present

## 2021-01-03 DIAGNOSIS — Z21 Asymptomatic human immunodeficiency virus [HIV] infection status: Secondary | ICD-10-CM | POA: Diagnosis not present

## 2021-01-03 DIAGNOSIS — Z7951 Long term (current) use of inhaled steroids: Secondary | ICD-10-CM | POA: Insufficient documentation

## 2021-01-03 DIAGNOSIS — I350 Nonrheumatic aortic (valve) stenosis: Secondary | ICD-10-CM | POA: Diagnosis not present

## 2021-01-03 DIAGNOSIS — J45909 Unspecified asthma, uncomplicated: Secondary | ICD-10-CM | POA: Insufficient documentation

## 2021-01-03 DIAGNOSIS — F17210 Nicotine dependence, cigarettes, uncomplicated: Secondary | ICD-10-CM | POA: Diagnosis not present

## 2021-01-03 DIAGNOSIS — K922 Gastrointestinal hemorrhage, unspecified: Secondary | ICD-10-CM | POA: Diagnosis not present

## 2021-01-03 DIAGNOSIS — I119 Hypertensive heart disease without heart failure: Secondary | ICD-10-CM | POA: Insufficient documentation

## 2021-01-03 DIAGNOSIS — I2510 Atherosclerotic heart disease of native coronary artery without angina pectoris: Secondary | ICD-10-CM | POA: Insufficient documentation

## 2021-01-03 DIAGNOSIS — M1990 Unspecified osteoarthritis, unspecified site: Secondary | ICD-10-CM | POA: Diagnosis not present

## 2021-01-03 LAB — CMP (CANCER CENTER ONLY)
ALT: 11 U/L (ref 0–44)
AST: 16 U/L (ref 15–41)
Albumin: 3.7 g/dL (ref 3.5–5.0)
Alkaline Phosphatase: 76 U/L (ref 38–126)
Anion gap: 9 (ref 5–15)
BUN: 29 mg/dL — ABNORMAL HIGH (ref 8–23)
CO2: 27 mmol/L (ref 22–32)
Calcium: 9.1 mg/dL (ref 8.9–10.3)
Chloride: 103 mmol/L (ref 98–111)
Creatinine: 0.93 mg/dL (ref 0.44–1.00)
GFR, Estimated: >60 mL/min (ref >60)
Glucose, Bld: 104 mg/dL — ABNORMAL HIGH (ref 70–99)
Potassium: 4.6 mmol/L (ref 3.5–5.1)
Sodium: 139 mmol/L (ref 135–145)
Total Bilirubin: 0.3 mg/dL (ref 0.3–1.2)
Total Protein: 7.5 g/dL (ref 6.5–8.1)

## 2021-01-03 LAB — IRON AND TIBC
Iron: 255 ug/dL — ABNORMAL HIGH (ref 41–142)
Saturation Ratios: 59 % — ABNORMAL HIGH (ref 21–57)
TIBC: 435 ug/dL (ref 236–444)
UIBC: 180 ug/dL (ref 120–384)

## 2021-01-03 LAB — CBC WITH DIFFERENTIAL (CANCER CENTER ONLY)
Abs Immature Granulocytes: 0.04 10*3/uL (ref 0.00–0.07)
Basophils Absolute: 0 10*3/uL (ref 0.0–0.1)
Basophils Relative: 1 %
Eosinophils Absolute: 0.1 10*3/uL (ref 0.0–0.5)
Eosinophils Relative: 2 %
HCT: 28.8 % — ABNORMAL LOW (ref 36.0–46.0)
Hemoglobin: 8.3 g/dL — ABNORMAL LOW (ref 12.0–15.0)
Immature Granulocytes: 1 %
Lymphocytes Relative: 26 %
Lymphs Abs: 1.1 10*3/uL (ref 0.7–4.0)
MCH: 30.5 pg (ref 26.0–34.0)
MCHC: 28.8 g/dL — ABNORMAL LOW (ref 30.0–36.0)
MCV: 105.9 fL — ABNORMAL HIGH (ref 80.0–100.0)
Monocytes Absolute: 0.5 10*3/uL (ref 0.1–1.0)
Monocytes Relative: 12 %
Neutro Abs: 2.5 10*3/uL (ref 1.7–7.7)
Neutrophils Relative %: 58 %
Platelet Count: 257 10*3/uL (ref 150–400)
RBC: 2.72 MIL/uL — ABNORMAL LOW (ref 3.87–5.11)
RDW: 18.2 % — ABNORMAL HIGH (ref 11.5–15.5)
WBC Count: 4.2 10*3/uL (ref 4.0–10.5)
nRBC: 0 % (ref 0.0–0.2)

## 2021-01-03 LAB — RETIC PANEL
Immature Retic Fract: 24.7 % — ABNORMAL HIGH (ref 2.3–15.9)
RBC.: 2.72 MIL/uL — ABNORMAL LOW (ref 3.87–5.11)
Retic Count, Absolute: 148.2 10*3/uL (ref 19.0–186.0)
Retic Ct Pct: 5.5 % — ABNORMAL HIGH (ref 0.4–3.1)
Reticulocyte Hemoglobin: 30.8 pg (ref >27.9)

## 2021-01-03 LAB — FERRITIN: Ferritin: 11 ng/mL (ref 11–307)

## 2021-01-05 ENCOUNTER — Telehealth: Payer: Self-pay | Admitting: Hematology and Oncology

## 2021-01-05 NOTE — Telephone Encounter (Signed)
Scheduled per 1/26 los. Called and spoke with pt, confirmed 2/4, 2/10, and 2/23 appts

## 2021-01-12 ENCOUNTER — Other Ambulatory Visit: Payer: Self-pay | Admitting: *Deleted

## 2021-01-12 ENCOUNTER — Inpatient Hospital Stay: Payer: 59

## 2021-01-12 ENCOUNTER — Telehealth: Payer: Self-pay | Admitting: *Deleted

## 2021-01-12 ENCOUNTER — Inpatient Hospital Stay: Payer: 59 | Attending: Hematology and Oncology

## 2021-01-12 ENCOUNTER — Other Ambulatory Visit: Payer: Self-pay

## 2021-01-12 VITALS — BP 126/73 | HR 70 | Temp 98.3°F | Resp 18

## 2021-01-12 DIAGNOSIS — R011 Cardiac murmur, unspecified: Secondary | ICD-10-CM | POA: Diagnosis not present

## 2021-01-12 DIAGNOSIS — D500 Iron deficiency anemia secondary to blood loss (chronic): Secondary | ICD-10-CM

## 2021-01-12 DIAGNOSIS — K219 Gastro-esophageal reflux disease without esophagitis: Secondary | ICD-10-CM | POA: Diagnosis not present

## 2021-01-12 DIAGNOSIS — F17210 Nicotine dependence, cigarettes, uncomplicated: Secondary | ICD-10-CM | POA: Insufficient documentation

## 2021-01-12 DIAGNOSIS — M129 Arthropathy, unspecified: Secondary | ICD-10-CM | POA: Diagnosis not present

## 2021-01-12 DIAGNOSIS — I119 Hypertensive heart disease without heart failure: Secondary | ICD-10-CM | POA: Diagnosis not present

## 2021-01-12 DIAGNOSIS — J45909 Unspecified asthma, uncomplicated: Secondary | ICD-10-CM | POA: Diagnosis not present

## 2021-01-12 DIAGNOSIS — I350 Nonrheumatic aortic (valve) stenosis: Secondary | ICD-10-CM | POA: Diagnosis not present

## 2021-01-12 DIAGNOSIS — D7589 Other specified diseases of blood and blood-forming organs: Secondary | ICD-10-CM | POA: Insufficient documentation

## 2021-01-12 DIAGNOSIS — I2510 Atherosclerotic heart disease of native coronary artery without angina pectoris: Secondary | ICD-10-CM | POA: Diagnosis not present

## 2021-01-12 DIAGNOSIS — Z79899 Other long term (current) drug therapy: Secondary | ICD-10-CM | POA: Diagnosis not present

## 2021-01-12 LAB — CBC WITH DIFFERENTIAL (CANCER CENTER ONLY)
Abs Immature Granulocytes: 0.01 10*3/uL (ref 0.00–0.07)
Basophils Absolute: 0 10*3/uL (ref 0.0–0.1)
Basophils Relative: 1 %
Eosinophils Absolute: 0.1 10*3/uL (ref 0.0–0.5)
Eosinophils Relative: 1 %
HCT: 22.6 % — ABNORMAL LOW (ref 36.0–46.0)
Hemoglobin: 6.4 g/dL — CL (ref 12.0–15.0)
Immature Granulocytes: 0 %
Lymphocytes Relative: 22 %
Lymphs Abs: 0.9 10*3/uL (ref 0.7–4.0)
MCH: 30.9 pg (ref 26.0–34.0)
MCHC: 28.3 g/dL — ABNORMAL LOW (ref 30.0–36.0)
MCV: 109.2 fL — ABNORMAL HIGH (ref 80.0–100.0)
Monocytes Absolute: 0.4 10*3/uL (ref 0.1–1.0)
Monocytes Relative: 9 %
Neutro Abs: 2.7 10*3/uL (ref 1.7–7.7)
Neutrophils Relative %: 67 %
Platelet Count: 219 10*3/uL (ref 150–400)
RBC: 2.07 MIL/uL — ABNORMAL LOW (ref 3.87–5.11)
RDW: 17.1 % — ABNORMAL HIGH (ref 11.5–15.5)
WBC Count: 4 10*3/uL (ref 4.0–10.5)
nRBC: 0 % (ref 0.0–0.2)

## 2021-01-12 LAB — PREPARE RBC (CROSSMATCH)

## 2021-01-12 MED ORDER — ACETAMINOPHEN 325 MG PO TABS
650.0000 mg | ORAL_TABLET | Freq: Once | ORAL | Status: AC
  Administered 2021-01-12: 650 mg via ORAL

## 2021-01-12 MED ORDER — SODIUM CHLORIDE 0.9 % IV SOLN
Freq: Once | INTRAVENOUS | Status: AC
  Filled 2021-01-12: qty 250

## 2021-01-12 MED ORDER — SODIUM CHLORIDE 0.9 % IV SOLN
510.0000 mg | Freq: Once | INTRAVENOUS | Status: AC
  Administered 2021-01-12: 510 mg via INTRAVENOUS
  Filled 2021-01-12: qty 510

## 2021-01-12 MED ORDER — SODIUM CHLORIDE 0.9% IV SOLUTION
250.0000 mL | Freq: Once | INTRAVENOUS | Status: AC
  Administered 2021-01-12: 250 mL via INTRAVENOUS
  Filled 2021-01-12: qty 250

## 2021-01-12 MED ORDER — ACETAMINOPHEN 325 MG PO TABS
ORAL_TABLET | ORAL | Status: AC
  Filled 2021-01-12: qty 2

## 2021-01-12 NOTE — Progress Notes (Signed)
Patient arrived to unit with complaints of SOB and dizziness upon exertion. Patient states feels more tired than usual. Denies any active bleeding. Patient states SOB and dizziness subside when she sits and rests. Dr. Lorenso Courier made aware. Per Dr. Lorenso Courier, ok to proceed with treatment today with low BP.  Labs to be drawn to check CBC levels.

## 2021-01-12 NOTE — Patient Instructions (Addendum)
Ferumoxytol injection What is this medicine? FERUMOXYTOL is an iron complex. Iron is used to make healthy red blood cells, which carry oxygen and nutrients throughout the body. This medicine is used to treat iron deficiency anemia. This medicine may be used for other purposes; ask your health care provider or pharmacist if you have questions. COMMON BRAND NAME(S): Feraheme What should I tell my health care provider before I take this medicine? They need to know if you have any of these conditions:  anemia not caused by low iron levels  high levels of iron in the blood  magnetic resonance imaging (MRI) test scheduled  an unusual or allergic reaction to iron, other medicines, foods, dyes, or preservatives  pregnant or trying to get pregnant  breast-feeding How should I use this medicine? This medicine is for injection into a vein. It is given by a health care professional in a hospital or clinic setting. Talk to your pediatrician regarding the use of this medicine in children. Special care may be needed. Overdosage: If you think you have taken too much of this medicine contact a poison control center or emergency room at once. NOTE: This medicine is only for you. Do not share this medicine with others. What if I miss a dose? It is important not to miss your dose. Call your doctor or health care professional if you are unable to keep an appointment. What may interact with this medicine? This medicine may interact with the following medications:  other iron products This list may not describe all possible interactions. Give your health care provider a list of all the medicines, herbs, non-prescription drugs, or dietary supplements you use. Also tell them if you smoke, drink alcohol, or use illegal drugs. Some items may interact with your medicine. What should I watch for while using this medicine? Visit your doctor or healthcare professional regularly. Tell your doctor or healthcare  professional if your symptoms do not start to get better or if they get worse. You may need blood work done while you are taking this medicine. You may need to follow a special diet. Talk to your doctor. Foods that contain iron include: whole grains/cereals, dried fruits, beans, or peas, leafy green vegetables, and organ meats (liver, kidney). What side effects may I notice from receiving this medicine? Side effects that you should report to your doctor or health care professional as soon as possible:  allergic reactions like skin rash, itching or hives, swelling of the face, lips, or tongue  breathing problems  changes in blood pressure  feeling faint or lightheaded, falls  fever or chills  flushing, sweating, or hot feelings  swelling of the ankles or feet Side effects that usually do not require medical attention (report to your doctor or health care professional if they continue or are bothersome):  diarrhea  headache  nausea, vomiting  stomach pain This list may not describe all possible side effects. Call your doctor for medical advice about side effects. You may report side effects to FDA at 1-800-FDA-1088. Where should I keep my medicine? This drug is given in a hospital or clinic and will not be stored at home. NOTE: This sheet is a summary. It may not cover all possible information. If you have questions about this medicine, talk to your doctor, pharmacist, or health care provider.  2021 Elsevier/Gold Standard (2017-01-13 20:21:10) Blood Transfusion, Adult, Care After This sheet gives you information about how to care for yourself after your procedure. Your doctor may also give   you more specific instructions. If you have problems or questions, contact your doctor. What can I expect after the procedure? After the procedure, it is common to have:  Bruising and soreness at the IV site.  A fever or chills on the day of the procedure. This may be your body's response to  the new blood cells received.  A headache. Follow these instructions at home: Insertion site care  Follow instructions from your doctor about how to take care of your insertion site. This is where an IV tube was put into your vein. Make sure you: ? Wash your hands with soap and water before and after you change your bandage (dressing). If you cannot use soap and water, use hand sanitizer. ? Change your bandage as told by your doctor.  Check your insertion site every day for signs of infection. Check for: ? Redness, swelling, or pain. ? Bleeding from the site. ? Warmth. ? Pus or a bad smell.      General instructions  Take over-the-counter and prescription medicines only as told by your doctor.  Rest as told by your doctor.  Go back to your normal activities as told by your doctor.  Keep all follow-up visits as told by your doctor. This is important. Contact a doctor if:  You have itching or red, swollen areas of skin (hives).  You feel worried or nervous (anxious).  You feel weak after doing your normal activities.  You have redness, swelling, warmth, or pain around the insertion site.  You have blood coming from the insertion site, and the blood does not stop with pressure.  You have pus or a bad smell coming from the insertion site. Get help right away if:  You have signs of a serious reaction. This may be coming from an allergy or the body's defense system (immune system). Signs include: ? Trouble breathing or shortness of breath. ? Swelling of the face or feeling warm (flushed). ? Fever or chills. ? Head, chest, or back pain. ? Dark pee (urine) or blood in the pee. ? Widespread rash. ? Fast heartbeat. ? Feeling dizzy or light-headed. You may receive your blood transfusion in an outpatient setting. If so, you will be told whom to contact to report any reactions. These symptoms may be an emergency. Do not wait to see if the symptoms will go away. Get medical help  right away. Call your local emergency services (911 in the [place] Do not drive yourself to the hospital. Summary  Bruising and soreness at the IV site are common.  Check your insertion site every day for signs of infection.  Rest as told by your doctor. Go back to your normal activities as told by your doctor.  Get help right away if you have signs of a serious reaction. This information is not intended to replace advice given to you by your health care provider. Make sure you discuss any questions you have with your health care provider. Document Revised: 05/20/2019 Document Reviewed: 05/20/2019 Elsevier Patient Education  2021 Elsevier Inc.  

## 2021-01-12 NOTE — Telephone Encounter (Signed)
Shared lab results with Dr. Lorenso Courier. Pt's HGB is 6.4  Received orders from him to get type and cross and transfuse pt with 1 unit PRBC's today. Discussed this with charge nurse, Delle Reining and received the ok to do this today. Spoke with pt's nurse, Marita Kansas, RN and the pt regarding need for transfusion.  Pt to have type and screen done in the infusion area.  Orders placed for 1 unit of blood.

## 2021-01-12 NOTE — Telephone Encounter (Signed)
CRITICAL VALUE STICKER  CRITICAL VALUE:HGB 6.4  RECEIVER (on-site recipient of call):Sandi K, RN  DATE & TIME NOTIFIED: 01/12/21; 1005  MESSENGER (representative from lab):Jay  MD NOTIFIED: Dr. Darleene Cleaver support  TIME OF NOTIFICATION:1011  RESPONSE: information acknowledged

## 2021-01-13 LAB — TYPE AND SCREEN
ABO/RH(D): O POS
Antibody Screen: NEGATIVE
Unit division: 0

## 2021-01-13 LAB — BPAM RBC
Blood Product Expiration Date: 202203062359
ISSUE DATE / TIME: 202202041248
Unit Type and Rh: 5100

## 2021-01-18 ENCOUNTER — Other Ambulatory Visit: Payer: Self-pay

## 2021-01-18 ENCOUNTER — Inpatient Hospital Stay: Payer: 59

## 2021-01-18 VITALS — BP 97/73 | HR 71 | Temp 98.0°F | Resp 18

## 2021-01-18 DIAGNOSIS — D500 Iron deficiency anemia secondary to blood loss (chronic): Secondary | ICD-10-CM

## 2021-01-18 LAB — CBC WITH DIFFERENTIAL (CANCER CENTER ONLY)
Abs Immature Granulocytes: 0.01 10*3/uL (ref 0.00–0.07)
Basophils Absolute: 0 10*3/uL (ref 0.0–0.1)
Basophils Relative: 1 %
Eosinophils Absolute: 0.1 10*3/uL (ref 0.0–0.5)
Eosinophils Relative: 2 %
HCT: 31.2 % — ABNORMAL LOW (ref 36.0–46.0)
Hemoglobin: 9.4 g/dL — ABNORMAL LOW (ref 12.0–15.0)
Immature Granulocytes: 0 %
Lymphocytes Relative: 28 %
Lymphs Abs: 1.3 10*3/uL (ref 0.7–4.0)
MCH: 32.3 pg (ref 26.0–34.0)
MCHC: 30.1 g/dL (ref 30.0–36.0)
MCV: 107.2 fL — ABNORMAL HIGH (ref 80.0–100.0)
Monocytes Absolute: 0.4 10*3/uL (ref 0.1–1.0)
Monocytes Relative: 9 %
Neutro Abs: 2.7 10*3/uL (ref 1.7–7.7)
Neutrophils Relative %: 60 %
Platelet Count: 232 10*3/uL (ref 150–400)
RBC: 2.91 MIL/uL — ABNORMAL LOW (ref 3.87–5.11)
RDW: 19.9 % — ABNORMAL HIGH (ref 11.5–15.5)
WBC Count: 4.5 10*3/uL (ref 4.0–10.5)
nRBC: 0 % (ref 0.0–0.2)

## 2021-01-18 LAB — SAMPLE TO BLOOD BANK

## 2021-01-18 MED ORDER — SODIUM CHLORIDE 0.9 % IV SOLN
510.0000 mg | Freq: Once | INTRAVENOUS | Status: AC
  Administered 2021-01-18: 510 mg via INTRAVENOUS
  Filled 2021-01-18: qty 510

## 2021-01-18 MED ORDER — SODIUM CHLORIDE 0.9 % IV SOLN
Freq: Once | INTRAVENOUS | Status: AC
  Filled 2021-01-18: qty 250

## 2021-01-18 NOTE — Patient Instructions (Signed)

## 2021-01-31 ENCOUNTER — Encounter: Payer: Self-pay | Admitting: Hematology and Oncology

## 2021-01-31 ENCOUNTER — Inpatient Hospital Stay: Payer: 59

## 2021-01-31 ENCOUNTER — Other Ambulatory Visit: Payer: Self-pay | Admitting: Hematology and Oncology

## 2021-01-31 ENCOUNTER — Other Ambulatory Visit: Payer: Self-pay

## 2021-01-31 ENCOUNTER — Inpatient Hospital Stay (HOSPITAL_BASED_OUTPATIENT_CLINIC_OR_DEPARTMENT_OTHER): Payer: 59 | Admitting: Hematology and Oncology

## 2021-01-31 VITALS — BP 127/65 | HR 72 | Temp 97.1°F | Resp 18 | Ht 62.0 in | Wt 127.5 lb

## 2021-01-31 DIAGNOSIS — D500 Iron deficiency anemia secondary to blood loss (chronic): Secondary | ICD-10-CM

## 2021-01-31 LAB — CBC WITH DIFFERENTIAL (CANCER CENTER ONLY)
Abs Immature Granulocytes: 0.01 10*3/uL (ref 0.00–0.07)
Basophils Absolute: 0 10*3/uL (ref 0.0–0.1)
Basophils Relative: 0 %
Eosinophils Absolute: 0.1 10*3/uL (ref 0.0–0.5)
Eosinophils Relative: 2 %
HCT: 35.7 % — ABNORMAL LOW (ref 36.0–46.0)
Hemoglobin: 11 g/dL — ABNORMAL LOW (ref 12.0–15.0)
Immature Granulocytes: 0 %
Lymphocytes Relative: 29 %
Lymphs Abs: 1.4 10*3/uL (ref 0.7–4.0)
MCH: 34.4 pg — ABNORMAL HIGH (ref 26.0–34.0)
MCHC: 30.8 g/dL (ref 30.0–36.0)
MCV: 111.6 fL — ABNORMAL HIGH (ref 80.0–100.0)
Monocytes Absolute: 0.5 10*3/uL (ref 0.1–1.0)
Monocytes Relative: 10 %
Neutro Abs: 3 10*3/uL (ref 1.7–7.7)
Neutrophils Relative %: 59 %
Platelet Count: 215 10*3/uL (ref 150–400)
RBC: 3.2 MIL/uL — ABNORMAL LOW (ref 3.87–5.11)
RDW: 19.5 % — ABNORMAL HIGH (ref 11.5–15.5)
WBC Count: 5.1 10*3/uL (ref 4.0–10.5)
nRBC: 0 % (ref 0.0–0.2)

## 2021-01-31 LAB — CMP (CANCER CENTER ONLY)
ALT: 13 U/L (ref 0–44)
AST: 23 U/L (ref 15–41)
Albumin: 3.9 g/dL (ref 3.5–5.0)
Alkaline Phosphatase: 69 U/L (ref 38–126)
Anion gap: 10 (ref 5–15)
BUN: 28 mg/dL — ABNORMAL HIGH (ref 8–23)
CO2: 25 mmol/L (ref 22–32)
Calcium: 9 mg/dL (ref 8.9–10.3)
Chloride: 108 mmol/L (ref 98–111)
Creatinine: 1.05 mg/dL — ABNORMAL HIGH (ref 0.44–1.00)
GFR, Estimated: 55 mL/min — ABNORMAL LOW (ref >60)
Glucose, Bld: 99 mg/dL (ref 70–99)
Potassium: 4.4 mmol/L (ref 3.5–5.1)
Sodium: 143 mmol/L (ref 135–145)
Total Bilirubin: 0.3 mg/dL (ref 0.3–1.2)
Total Protein: 7.4 g/dL (ref 6.5–8.1)

## 2021-01-31 LAB — RETIC PANEL
Immature Retic Fract: 27.8 % — ABNORMAL HIGH (ref 2.3–15.9)
RBC.: 3.14 MIL/uL — ABNORMAL LOW (ref 3.87–5.11)
Retic Count, Absolute: 87.3 10*3/uL (ref 19.0–186.0)
Retic Ct Pct: 2.8 % (ref 0.4–3.1)
Reticulocyte Hemoglobin: 41.3 pg (ref >27.9)

## 2021-01-31 LAB — VITAMIN B12: Vitamin B-12: 348 pg/mL (ref 180–914)

## 2021-01-31 MED ORDER — VITAMIN B-12 1000 MCG PO TABS
1000.0000 ug | ORAL_TABLET | Freq: Every day | ORAL | 1 refills | Status: DC
Start: 2021-01-31 — End: 2021-07-18

## 2021-01-31 MED ORDER — FOLIC ACID 1 MG PO TABS: 1.0000 mg | ORAL_TABLET | Freq: Every day | ORAL | 1 refills | Status: DC

## 2021-01-31 NOTE — Progress Notes (Signed)
[place] [phone]   [phone]  PROGRESS NOTE  Patient Care Team: Sonia Side., FNP as PCP - General (Family Medicine) Johnnye Sima Doroteo Bradford, MD as PCP - Infectious Diseases (Infectious Diseases) Dorothy Spark, MD as PCP - Cardiology (Cardiology) Thompson Grayer, MD as PCP - Electrophysiology (Cardiology) Arta Silence, MD as Consulting Physician (Gastroenterology)  Hematological/Oncological History # Iron Deficiency Anemia 2/2 to GI Bleed  10/29/2020: patient presented the ED with weakness and was found to have a Hgb of 4.1 11/01/2020: GI evaluation showed numerous bleeding/nonbleeding AVMS. Cauterization performed.  12/18/2020: Iron 32, TIBC 441, Sat 7%, Ferritin 31. Hgb 8.3, Plt 313, WBC 4.2, MCV 102.5.  01/03/2021: establish care with Dr. Lorenso Courier  2/4-2/09/2021: IV feraheme 510mg  x 2 doses. Received 1 unit of PRBC on 01/12/2021   Interval History:  Nicole Mccormick 75 y.o. female with medical history significant for iron deficiency anemia 2/2 to GI bleed who presents for a follow up visit. The patient's last visit was on 01/03/2021. In the interim since the last visit she received a unit of PRBC on 01/12/2021 and 2 doses of IV feraheme.   On exam today Nicole Mccormick notes she has had a marked improvement in all of her symptoms since she received the 2 doses of IV Feraheme.  She reports that she had no side effects as result of the treatment.  She reports that she "feels good and is not having any shortness of breath, fatigue, and much better overall".  She notes that she has not had any overt signs of bleeding, bruising, or blood in her stool since her last visit.  Her stools are dark as a result of her p.o. iron therapy which she has been taking with vitamin C and orange juice.  She is very optimistic about the way she feels and is overall quite satisfied.  A full 10 point ROS is listed below.  MEDICAL HISTORY:  Past Medical History:  Diagnosis Date  .  Abnormal Pap smear   . Aortic stenosis   . Arthritis   . Asthma   . AVM (arteriovenous malformation)   . Bradycardia   . Complication of anesthesia    "they have a hard time bringing me back" (11/08/2015)  . Coronary artery disease    a. diffuse CAD of RCA by cath 2016 managed medically.  . Dynamic left ventricular outflow obstruction   . GERD (gastroesophageal reflux disease)    previously on aciphex, discontinued november 2012  because patient asymptomatic, and concern about interference with HIV meds.  May try pepcid in the future if symptoms return  . Heart murmur   . Hepatitis C antibody test positive   . HIV infection ([place])    diagnosed before 2008  . Hypertension   . Hypertensive heart disease   . Iron deficiency anemia    Ferritin = 2 in november 2012, started on iron supplemenation  . Pacemaker    a. St Jude PPM 01/2018.  . Seizures ([place])   . Symptomatic anemia 08/24/2020  . Varicosities     SURGICAL HISTORY: Past Surgical History:  Procedure Laterality Date  . ABDOMINAL HYSTERECTOMY    . CARDIAC CATHETERIZATION N/A 11/08/2015   Procedure: Left Heart Cath and Coronary Angiography;  Surgeon: Jettie Booze, MD;  Location: [place] [facility];  Service: Cardiovascular;  Laterality: N/A;  . COLONOSCOPY  10/13/11   small adenoma, anal condyloma  . COLONOSCOPY  10/13/2011   Procedure: COLONOSCOPY;  Surgeon: Glendell Docker  Simonne Maffucci, MD;  Location: [place];  Service: Endoscopy;  Laterality: N/A;  . COLONOSCOPY N/A 09/14/2014   Procedure: COLONOSCOPY;  Surgeon: Arta Silence, MD;  Location: [facility]OSCOPY;  Service: Endoscopy;  Laterality: N/A;  . COLONOSCOPY WITH PROPOFOL N/A 06/05/2020   Procedure: COLONOSCOPY WITH PROPOFOL;  Surgeon: Arta Silence, MD;  Location: [place];  Service: Endoscopy;  Laterality: N/A;  . ENTEROSCOPY N/A 10/31/2020   Procedure: ENTEROSCOPY;  Surgeon: Otis Brace, MD;  Location: [place] [facility];  Service: Gastroenterology;  Laterality: N/A;   . ESOPHAGOGASTRODUODENOSCOPY  10/13/11   small hiatal hernia  . ESOPHAGOGASTRODUODENOSCOPY  10/13/2011   Procedure: ESOPHAGOGASTRODUODENOSCOPY (EGD);  Surgeon: Gatha Mayer, MD;  Location: [facility];  Service: Endoscopy;  Laterality: N/A;  . ESOPHAGOGASTRODUODENOSCOPY N/A 09/14/2014   Procedure: ESOPHAGOGASTRODUODENOSCOPY (EGD);  Surgeon: Arta Silence, MD;  Location: [facility] ENDOSCOPY;  Service: Endoscopy;  Laterality: N/A;  . ESOPHAGOGASTRODUODENOSCOPY (EGD) WITH PROPOFOL N/A 06/05/2020   Procedure: ESOPHAGOGASTRODUODENOSCOPY (EGD) WITH PROPOFOL;  Surgeon: Arta Silence, MD;  Location: [place];  Service: Endoscopy;  Laterality: N/A;  . GIVENS CAPSULE STUDY N/A 09/14/2014   Procedure: GIVENS CAPSULE STUDY;  Surgeon: Arta Silence, MD;  Location: [facility];  Service: Endoscopy;  Laterality: N/A;  . GIVENS CAPSULE STUDY N/A 08/26/2020   Procedure: GIVENS CAPSULE STUDY;  Surgeon: Wonda Horner, MD;  Location: [facility] ENDOSCOPY;  Service: Endoscopy;  Laterality: N/A;  . HOT HEMOSTASIS N/A 10/31/2020   Procedure: HOT HEMOSTASIS (ARGON PLASMA COAGULATION/BICAP);  Surgeon: Otis Brace, MD;  Location: [facility] ENDOSCOPY;  Service: Gastroenterology;  Laterality: N/A;  . PACEMAKER IMPLANT N/A 02/05/2018   Procedure: PACEMAKER IMPLANT;  Surgeon: Thompson Grayer, MD;  Location: [place] [facility];  Service: Cardiovascular;  Laterality: N/A;  . POLYPECTOMY  06/05/2020   Procedure: POLYPECTOMY;  Surgeon: Arta Silence, MD;  Location: [facility] ENDOSCOPY;  Service: Endoscopy;;    SOCIAL HISTORY: Social History   Socioeconomic History  . Marital status: Married    Spouse name: Not on file  . Number of children: 6  . Years of education: Not on file  . Highest education level: Not on file  Occupational History  . Occupation: retired  Tobacco Use  . Smoking status: Current Every Day Smoker    Packs/day: 1.00    Years: 60.00    Pack years: 60.00    Types: Cigarettes  . Smokeless tobacco: Never Used  .  Tobacco comment: Wants to quit. Using both the patch and gum.  Vaping Use  . Vaping Use: Never used  Substance and Sexual Activity  . Alcohol use: Yes    Alcohol/week: 10.0 standard drinks    Types: 10 Glasses of wine per week    Comment: drinking less recently, denies h/o heavy use  . Drug use: No  . Sexual activity: Yes    Partners: Male    Birth control/protection: Surgical    Comment: pt. given condoms 08/23/20  Other Topics Concern  . Not on file  Social History Narrative  . Not on file   Social Determinants of Health   Financial Resource Strain: Not on file  Food Insecurity: Not on file  Transportation Needs: Not on file  Physical Activity: Not on file  Stress: Not on file  Social Connections: Not on file  Intimate Partner Violence: Not on file    FAMILY HISTORY: Family History  Problem Relation Age of Onset  . Diabetes Mother   . Ovarian cancer Sister   . Colon cancer Sister     ALLERGIES:  is allergic to penicillins, penicillin g, and avelox [moxifloxacin hcl in nacl].  MEDICATIONS:  Current Outpatient Medications  Medication Sig Dispense Refill  . cholecalciferol (VITAMIN D3) 25 MCG (1000 UNIT) tablet Take 1,000 Units by mouth daily.    Marland Kitchen acyclovir (ZOVIRAX) 400 MG tablet Take 1 tablet (400 mg total) by mouth daily as needed (for flare ups). 30 tablet 3  . albuterol (PROAIR HFA) 108 (90 Base) MCG/ACT inhaler INHALE 2 PUFFS BY MOUTH EVERY 4 HOURS IF NEEDED FOR WHEEZING OR SHORTNESS OF BREATH (Patient taking differently: Inhale 2 puffs into the lungs every 4 (four) hours as needed for wheezing or shortness of breath. ) 8.5 g 0  . ALPRAZolam (XANAX) 0.5 MG tablet Take 0.5 mg by mouth 3 (three) times daily as needed for anxiety.    Marland Kitchen amLODipine (NORVASC) 10 MG tablet TAKE 1 TABLET BY MOUTH EVERY DAY (Patient taking differently: Take 10 mg by mouth daily. ) 90 tablet 0  . Ascorbic Acid (VITAMIN C) 1000 MG tablet Take 1,000 mg by mouth daily.    Marland Kitchen atorvastatin  (LIPITOR) 40 MG tablet take 1 tablet by mouth once daily AT 6 PM (Patient taking differently: Take 40 mg by mouth every evening. ) 90 tablet 0  . BIKTARVY 50-200-25 MG TABS tablet TAKE 1 TABLET BY MOUTH EVERY DAY (Patient taking differently: Take 1 tablet by mouth daily. ) 90 tablet 3  . carvedilol (COREG) 3.125 MG tablet TAKE 1 TABLET(3.125 MG) BY MOUTH TWICE DAILY (Patient taking differently: Take 3.125 mg by mouth 2 (two) times daily with a meal. ) 180 tablet 3  . cetirizine (ZYRTEC) 10 MG tablet Take 10 mg by mouth daily.    Marland Kitchen dextromethorphan (DELSYM) 30 MG/5ML liquid Take 30 mg by mouth as needed for cough.     . diazepam (VALIUM) 2 MG tablet take 1 tablet by mouth every 6 hours if needed for anxiety (Patient taking differently: Take 2 mg by mouth as needed for anxiety. ) 30 tablet 0  . escitalopram (LEXAPRO) 10 MG tablet Take 10 mg by mouth daily as needed (anxiety).     . famotidine (PEPCID) 20 MG tablet Take 20 mg by mouth 2 (two) times daily.    . ferrous sulfate 325 (65 FE) MG EC tablet Take 1 tablet (325 mg total) by mouth in the morning and at bedtime. 180 tablet 0  . Flaxseed, Linseed, (FLAX SEED OIL) 1000 MG CAPS Take 1,000 mg by mouth daily.    Marland Kitchen gabapentin (NEURONTIN) 100 MG capsule Take 100 mg by mouth 3 (three) times daily.    Marland Kitchen gabapentin (NEURONTIN) 300 MG capsule     . hydrochlorothiazide (HYDRODIURIL) 25 MG tablet Take 25 mg by mouth daily.     Marland Kitchen HYDROcodone-acetaminophen (NORCO/VICODIN) 5-325 MG tablet Take 1 tablet by mouth 2 (two) times daily as needed.    . isosorbide mononitrate (IMDUR) 60 MG 24 hr tablet Take 1 tablet (60 mg total) by mouth daily. 90 tablet 1  . lisinopril (ZESTRIL) 40 MG tablet Take 40 mg by mouth daily.    . meclizine (ANTIVERT) 25 MG tablet Take 25 mg by mouth as needed for dizziness.     . Nutritional Supplements (ENSURE ORIGINAL PO) See admin instructions.    . Omega-3 Fatty Acids (FISH OIL) 1000 MG CPDR Take 1,000 mg by mouth daily.    .  ondansetron (ZOFRAN ODT) 4 MG disintegrating tablet 1 tablet on the tongue and allow to dissolve    . pantoprazole (  PROTONIX) 40 MG tablet Take 1 tablet (40 mg total) by mouth daily. 90 tablet 0  . Polyethylene Glycol 1000 POWD Mix 17g (1 capful) in a drink    . spironolactone (ALDACTONE) 25 MG tablet TAKE 1 TABLET BY MOUTH EVERY DAY (Patient taking differently: Take 25 mg by mouth daily. ) 30 tablet 0  . SYMBICORT 160-4.5 MCG/ACT inhaler Inhale 2 puffs into the lungs 2 (two) times daily.    . vitamin A 10000 UNIT capsule Take 10,000 Units by mouth daily.    . vitamin B-12 (CYANOCOBALAMIN) 1000 MCG tablet Take 1,000 mcg by mouth daily. (Patient not taking: Reported on 01/31/2021)     No current facility-administered medications for this visit.    REVIEW OF SYSTEMS:   Constitutional: ( - ) fevers, ( - )  chills , ( - ) night sweats Eyes: ( - ) blurriness of vision, ( - ) double vision, ( - ) watery eyes Ears, nose, mouth, throat, and face: ( - ) mucositis, ( - ) sore throat Respiratory: ( - ) cough, ( - ) dyspnea, ( - ) wheezes Cardiovascular: ( - ) palpitation, ( - ) chest discomfort, ( - ) lower extremity swelling Gastrointestinal:  ( - ) nausea, ( - ) heartburn, ( - ) change in bowel habits Skin: ( - ) abnormal skin rashes Lymphatics: ( - ) new lymphadenopathy, ( - ) easy bruising Neurological: ( - ) numbness, ( - ) tingling, ( - ) new weaknesses Behavioral/Psych: ( - ) mood change, ( - ) new changes  All other systems were reviewed with the patient and are negative.  PHYSICAL EXAMINATION:  Vitals:   01/31/21 1542  BP: 127/65  Pulse: 72  Resp: 18  Temp: (!) 97.1 F (36.2 C)  SpO2: 98%   Filed Weights   01/31/21 1542  Weight: 127 lb 8 oz (57.8 kg)    GENERAL: well appearing elderly African American female. alert, no distress and comfortable SKIN: skin color, texture, turgor are normal, no rashes or significant lesions EYES: conjunctiva are pink and non-injected, sclera  clear LUNGS: clear to auscultation and percussion with normal breathing effort HEART: regular rate & rhythm and no murmurs and no lower extremity edema Musculoskeletal: no cyanosis of digits and no clubbing  PSYCH: alert & oriented x 3, fluent speech NEURO: no focal motor/sensory deficits  LABORATORY DATA:  I have reviewed the data as listed CBC Latest Ref Rng & Units 01/31/2021 01/18/2021 01/12/2021  WBC 4.0 - 10.5 K/uL 5.1 4.5 4.0  Hemoglobin 12.0 - 15.0 g/dL 11.0(L) 9.4(L) 6.4(LL)  Hematocrit 36.0 - 46.0 % 35.7(L) 31.2(L) 22.6(L)  Platelets 150 - 400 K/uL 215 232 219    CMP Latest Ref Rng & Units 01/31/2021 01/03/2021 10/29/2020  Glucose 70 - 99 mg/dL 99 104(H) 115(H)  BUN 8 - 23 mg/dL 28(H) 29(H) 21  Creatinine 0.44 - 1.00 mg/dL 1.05(H) 0.93 1.27(H)  Sodium 135 - 145 mmol/L 143 139 139  Potassium 3.5 - 5.1 mmol/L 4.4 4.6 4.0  Chloride 98 - 111 mmol/L 108 103 107  CO2 22 - 32 mmol/L 25 27 24   Calcium 8.9 - 10.3 mg/dL 9.0 9.1 8.6(L)  Total Protein 6.5 - 8.1 g/dL 7.4 7.5 -  Total Bilirubin 0.3 - 1.2 mg/dL 0.3 0.3 -  Alkaline Phos 38 - 126 U/L 69 76 -  AST 15 - 41 U/L 23 16 -  ALT 0 - 44 U/L 13 11 -    RADIOGRAPHIC STUDIES: No results found.  ASSESSMENT & PLAN Nivia Gervase 75 y.o. female with medical history significant for iron deficiency anemia 2/2 to GI bleed who presents for a follow up visit.   After review the labs, the records, discussion the patient the findings most consistent with an iron deficiency anemia responding to IV iron therapy.  Her hemoglobin has increased to 11.0 however it has not reached the normal levels as of yet.  This may be due to the fact that we are checking her sooner than usual due to our concern that she had a hemoglobin of 6.4.  Hemoglobin can be expected to rise approximately 1 g/dL every 1 to 2 weeks.  As such she would be right on track for appropriate increase.  We are checking her iron levels again today in order to assure that they are robust and  that she has all the iron she requires for further [place]'s.  Given the macrocytosis I would recommend that we check vitamin B12 again today and recommend that she start vitamin B12 and folate therapy.  She also reports that she drinks approximately 1 glass of wine per night which may be partially to blame for her macrocytosis.  We will plan to have her repeat labs in 4 weeks and return to clinic visit in 12 weeks assuming that 4-week lab visit shows normal results.  The patient voiced understanding of this plan moving forward.   # Iron Deficiency Anemia 2/2 to GI Bleed   -- findings were consistent with persistent iron deficiency anemia despite adequate PO iron therapy --patient is s/p 510mg  IV feraheme q 7 days x 2 doses on 2/4-2/10/22.  --today will order repeat iron panel, ferritin, CBC, CMP and reticulocyte panel  --continue PO ferrous sulfate 325mg  daily in the interim. Take with a source of Vitamin C (orange juice best)  --assure patient contin --RTC in 3 months with interval 4 week labs.   #Macrocytosis --Hgb has improved to 11.0, not yet at target --marked macrocytosis, possible secondary deficiency with folate or vitamin b12. Recommend supplementing these nutrients --potentially represent robust reticulocytosis.  --if no resolution with appropriate nutritional repletion, consider workup for liver disease. --patient not currently on any medications known to cause macrocytosis.    Orders Placed This Encounter  Procedures  . Vitamin B12    Standing Status:   Future    Standing Expiration Date:   01/31/2022    All questions were answered. The patient knows to call the clinic with any problems, questions or concerns.  A total of more than 30 minutes were spent on this encounter and over half of that time was spent on counseling and coordination of care as outlined above.   Ledell Peoples, MD Department of Hematology/Oncology [place] [facility] Phone: [phone] Pager: [phone] Email: [email or web address]  01/31/2021 4:01 PM

## 2021-02-01 LAB — FERRITIN: Ferritin: 132 ng/mL (ref 11–307)

## 2021-02-01 LAB — IRON AND TIBC
Iron: 165 ug/dL — ABNORMAL HIGH (ref 41–142)
Saturation Ratios: 46 % (ref 21–57)
TIBC: 361 ug/dL (ref 236–444)
UIBC: 196 ug/dL (ref 120–384)

## 2021-02-06 ENCOUNTER — Other Ambulatory Visit: Payer: Self-pay | Admitting: Family

## 2021-02-06 DIAGNOSIS — E2839 Other primary ovarian failure: Secondary | ICD-10-CM

## 2021-02-21 ENCOUNTER — Ambulatory Visit (INDEPENDENT_AMBULATORY_CARE_PROVIDER_SITE_OTHER): Payer: 59

## 2021-02-21 DIAGNOSIS — I495 Sick sinus syndrome: Secondary | ICD-10-CM | POA: Diagnosis not present

## 2021-02-22 LAB — CUP PACEART REMOTE DEVICE CHECK
Battery Remaining Longevity: 122 mo
Battery Remaining Percentage: 95.5 %
Battery Voltage: 2.99 V
Brady Statistic AP VP Percent: 1.4 %
Brady Statistic AP VS Percent: 93 %
Brady Statistic AS VP Percent: 1 %
Brady Statistic AS VS Percent: 5.8 %
Brady Statistic RA Percent Paced: 92 %
Brady Statistic RV Percent Paced: 1.5 %
Date Time Interrogation Session: 20220316020012
Implantable Lead Implant Date: 20190228
Implantable Lead Implant Date: 20190228
Implantable Lead Location: 753859
Implantable Lead Location: 753860
Implantable Pulse Generator Implant Date: 20190228
Lead Channel Impedance Value: 350 Ohm
Lead Channel Impedance Value: 530 Ohm
Lead Channel Pacing Threshold Amplitude: 0.375 V
Lead Channel Pacing Threshold Amplitude: 0.625 V
Lead Channel Pacing Threshold Pulse Width: 0.5 ms
Lead Channel Pacing Threshold Pulse Width: 0.5 ms
Lead Channel Sensing Intrinsic Amplitude: 11.4 mV
Lead Channel Sensing Intrinsic Amplitude: 4.2 mV
Lead Channel Setting Pacing Amplitude: 0.875
Lead Channel Setting Pacing Amplitude: 1.375
Lead Channel Setting Pacing Pulse Width: 0.5 ms
Lead Channel Setting Sensing Sensitivity: 2 mV
Pulse Gen Model: 2272
Pulse Gen Serial Number: 8996997

## 2021-02-28 ENCOUNTER — Inpatient Hospital Stay: Payer: 59 | Attending: Hematology and Oncology

## 2021-02-28 ENCOUNTER — Other Ambulatory Visit: Payer: Self-pay | Admitting: Hematology and Oncology

## 2021-02-28 ENCOUNTER — Other Ambulatory Visit: Payer: Self-pay

## 2021-02-28 DIAGNOSIS — D500 Iron deficiency anemia secondary to blood loss (chronic): Secondary | ICD-10-CM | POA: Insufficient documentation

## 2021-02-28 LAB — CBC WITH DIFFERENTIAL (CANCER CENTER ONLY)
Abs Immature Granulocytes: 0.04 10*3/uL (ref 0.00–0.07)
Basophils Absolute: 0 10*3/uL (ref 0.0–0.1)
Basophils Relative: 1 %
Eosinophils Absolute: 0.1 10*3/uL (ref 0.0–0.5)
Eosinophils Relative: 2 %
HCT: 32.9 % — ABNORMAL LOW (ref 36.0–46.0)
Hemoglobin: 10.8 g/dL — ABNORMAL LOW (ref 12.0–15.0)
Immature Granulocytes: 1 %
Lymphocytes Relative: 24 %
Lymphs Abs: 1 10*3/uL (ref 0.7–4.0)
MCH: 35.5 pg — ABNORMAL HIGH (ref 26.0–34.0)
MCHC: 32.8 g/dL (ref 30.0–36.0)
MCV: 108.2 fL — ABNORMAL HIGH (ref 80.0–100.0)
Monocytes Absolute: 0.3 10*3/uL (ref 0.1–1.0)
Monocytes Relative: 8 %
Neutro Abs: 2.9 10*3/uL (ref 1.7–7.7)
Neutrophils Relative %: 64 %
Platelet Count: 203 10*3/uL (ref 150–400)
RBC: 3.04 MIL/uL — ABNORMAL LOW (ref 3.87–5.11)
RDW: 16.5 % — ABNORMAL HIGH (ref 11.5–15.5)
WBC Count: 4.4 10*3/uL (ref 4.0–10.5)
nRBC: 0 % (ref 0.0–0.2)

## 2021-02-28 LAB — CMP (CANCER CENTER ONLY)
ALT: 19 U/L (ref 0–44)
AST: 20 U/L (ref 15–41)
Albumin: 4.1 g/dL (ref 3.5–5.0)
Alkaline Phosphatase: 71 U/L (ref 38–126)
Anion gap: 11 (ref 5–15)
BUN: 29 mg/dL — ABNORMAL HIGH (ref 8–23)
CO2: 27 mmol/L (ref 22–32)
Calcium: 9.2 mg/dL (ref 8.9–10.3)
Chloride: 102 mmol/L (ref 98–111)
Creatinine: 0.88 mg/dL (ref 0.44–1.00)
GFR, Estimated: >60 mL/min (ref >60)
Glucose, Bld: 100 mg/dL — ABNORMAL HIGH (ref 70–99)
Potassium: 4 mmol/L (ref 3.5–5.1)
Sodium: 140 mmol/L (ref 135–145)
Total Bilirubin: 0.3 mg/dL (ref 0.3–1.2)
Total Protein: 7.8 g/dL (ref 6.5–8.1)

## 2021-02-28 LAB — RETIC PANEL
Immature Retic Fract: 29.5 % — ABNORMAL HIGH (ref 2.3–15.9)
RBC.: 3.03 MIL/uL — ABNORMAL LOW (ref 3.87–5.11)
Retic Count, Absolute: 120.3 10*3/uL (ref 19.0–186.0)
Retic Ct Pct: 4 % — ABNORMAL HIGH (ref 0.4–3.1)
Reticulocyte Hemoglobin: 36.9 pg (ref >27.9)

## 2021-02-28 LAB — IRON AND TIBC
Iron: 75 ug/dL (ref 41–142)
Saturation Ratios: 18 % — ABNORMAL LOW (ref 21–57)
TIBC: 411 ug/dL (ref 236–444)
UIBC: 336 ug/dL (ref 120–384)

## 2021-02-28 LAB — VITAMIN B12: Vitamin B-12: 701 pg/mL (ref 180–914)

## 2021-02-28 LAB — FERRITIN: Ferritin: 26 ng/mL (ref 11–307)

## 2021-02-28 LAB — FOLATE: Folate: >100 ng/mL (ref >5.9)

## 2021-03-01 NOTE — Progress Notes (Signed)
Remote pacemaker transmission.   

## 2021-03-21 ENCOUNTER — Telehealth: Payer: Self-pay | Admitting: *Deleted

## 2021-03-21 NOTE — Telephone Encounter (Signed)
Received vm message from patient. She is requesting a call back about maybe needing iron infusion before she goes on vacation next Wednesday.  TCT patient and spoke with her. Advised that she just had labs done on 02/28/21 and though she is still anemic , she is not at the level of requiring IV iron. Alos advised that since she is leaving on 03/28/21, we would not have enough time tget repat labs and get authorization for her IV iron by the 03/28/21 Assured pt that she should be ok on her trip but to expect o feel more fatigued as she will be doing more than she is used to. Advised that she call us when she gets back and let us know how she feels. If needed we can see her at that time.  Pt is in agreement with plan.  Dr. Lorenso Courier aware.

## 2021-04-05 ENCOUNTER — Other Ambulatory Visit: Payer: Self-pay

## 2021-04-05 DIAGNOSIS — E43 Unspecified severe protein-calorie malnutrition: Secondary | ICD-10-CM

## 2021-04-05 MED ORDER — ENSURE PO LIQD: 237.0000 mL | Freq: Two times a day (BID) | ORAL | 11 refills | Status: DC

## 2021-04-25 ENCOUNTER — Other Ambulatory Visit: Payer: Self-pay | Admitting: Hematology and Oncology

## 2021-04-25 ENCOUNTER — Inpatient Hospital Stay: Payer: 59 | Attending: Hematology and Oncology

## 2021-04-25 ENCOUNTER — Other Ambulatory Visit: Payer: Self-pay

## 2021-04-25 ENCOUNTER — Inpatient Hospital Stay (HOSPITAL_BASED_OUTPATIENT_CLINIC_OR_DEPARTMENT_OTHER): Payer: 59 | Admitting: Hematology and Oncology

## 2021-04-25 VITALS — BP 131/73 | HR 93 | Temp 97.7°F | Resp 18 | Ht 62.0 in | Wt 128.7 lb

## 2021-04-25 DIAGNOSIS — Z7289 Other problems related to lifestyle: Secondary | ICD-10-CM | POA: Diagnosis not present

## 2021-04-25 DIAGNOSIS — D500 Iron deficiency anemia secondary to blood loss (chronic): Secondary | ICD-10-CM

## 2021-04-25 DIAGNOSIS — Z79899 Other long term (current) drug therapy: Secondary | ICD-10-CM | POA: Insufficient documentation

## 2021-04-25 DIAGNOSIS — Z800 Family history of malignant neoplasm of digestive organs: Secondary | ICD-10-CM | POA: Diagnosis not present

## 2021-04-25 DIAGNOSIS — Z8041 Family history of malignant neoplasm of ovary: Secondary | ICD-10-CM | POA: Insufficient documentation

## 2021-04-25 DIAGNOSIS — D7589 Other specified diseases of blood and blood-forming organs: Secondary | ICD-10-CM | POA: Insufficient documentation

## 2021-04-25 DIAGNOSIS — K922 Gastrointestinal hemorrhage, unspecified: Secondary | ICD-10-CM | POA: Insufficient documentation

## 2021-04-25 DIAGNOSIS — Z833 Family history of diabetes mellitus: Secondary | ICD-10-CM | POA: Insufficient documentation

## 2021-04-25 DIAGNOSIS — R5383 Other fatigue: Secondary | ICD-10-CM | POA: Diagnosis not present

## 2021-04-25 DIAGNOSIS — F17210 Nicotine dependence, cigarettes, uncomplicated: Secondary | ICD-10-CM | POA: Insufficient documentation

## 2021-04-25 DIAGNOSIS — Z880 Allergy status to penicillin: Secondary | ICD-10-CM | POA: Diagnosis not present

## 2021-04-25 LAB — CMP (CANCER CENTER ONLY)
ALT: 9 U/L (ref 0–44)
AST: 18 U/L (ref 15–41)
Albumin: 3.5 g/dL (ref 3.5–5.0)
Alkaline Phosphatase: 62 U/L (ref 38–126)
Anion gap: 9 (ref 5–15)
BUN: 29 mg/dL — ABNORMAL HIGH (ref 8–23)
CO2: 27 mmol/L (ref 22–32)
Calcium: 9 mg/dL (ref 8.9–10.3)
Chloride: 105 mmol/L (ref 98–111)
Creatinine: 1.18 mg/dL — ABNORMAL HIGH (ref 0.44–1.00)
GFR, Estimated: 48 mL/min — ABNORMAL LOW (ref >60)
Glucose, Bld: 106 mg/dL — ABNORMAL HIGH (ref 70–99)
Potassium: 3.5 mmol/L (ref 3.5–5.1)
Sodium: 141 mmol/L (ref 135–145)
Total Bilirubin: <0.2 mg/dL — ABNORMAL LOW (ref 0.3–1.2)
Total Protein: 7 g/dL (ref 6.5–8.1)

## 2021-04-25 LAB — CBC WITH DIFFERENTIAL (CANCER CENTER ONLY)
Abs Immature Granulocytes: 0.03 10*3/uL (ref 0.00–0.07)
Basophils Absolute: 0 10*3/uL (ref 0.0–0.1)
Basophils Relative: 1 %
Eosinophils Absolute: 0.1 10*3/uL (ref 0.0–0.5)
Eosinophils Relative: 2 %
HCT: 34.6 % — ABNORMAL LOW (ref 36.0–46.0)
Hemoglobin: 11.1 g/dL — ABNORMAL LOW (ref 12.0–15.0)
Immature Granulocytes: 1 %
Lymphocytes Relative: 24 %
Lymphs Abs: 1.1 10*3/uL (ref 0.7–4.0)
MCH: 34.4 pg — ABNORMAL HIGH (ref 26.0–34.0)
MCHC: 32.1 g/dL (ref 30.0–36.0)
MCV: 107.1 fL — ABNORMAL HIGH (ref 80.0–100.0)
Monocytes Absolute: 0.5 10*3/uL (ref 0.1–1.0)
Monocytes Relative: 11 %
Neutro Abs: 2.7 10*3/uL (ref 1.7–7.7)
Neutrophils Relative %: 61 %
Platelet Count: 208 10*3/uL (ref 150–400)
RBC: 3.23 MIL/uL — ABNORMAL LOW (ref 3.87–5.11)
RDW: 14.8 % (ref 11.5–15.5)
WBC Count: 4.4 10*3/uL (ref 4.0–10.5)
nRBC: 0 % (ref 0.0–0.2)

## 2021-04-25 LAB — IRON AND TIBC
Iron: 246 ug/dL — ABNORMAL HIGH (ref 41–142)
Saturation Ratios: 60 % — ABNORMAL HIGH (ref 21–57)
TIBC: 408 ug/dL (ref 236–444)
UIBC: 161 ug/dL (ref 120–384)

## 2021-04-25 LAB — RETIC PANEL
Immature Retic Fract: 22.7 % — ABNORMAL HIGH (ref 2.3–15.9)
RBC.: 3.21 MIL/uL — ABNORMAL LOW (ref 3.87–5.11)
Retic Count, Absolute: 115.6 10*3/uL (ref 19.0–186.0)
Retic Ct Pct: 3.6 % — ABNORMAL HIGH (ref 0.4–3.1)
Reticulocyte Hemoglobin: 35.3 pg (ref >27.9)

## 2021-04-25 LAB — FOLATE: Folate: 38.8 ng/mL (ref >5.9)

## 2021-04-25 LAB — VITAMIN B12: Vitamin B-12: 888 pg/mL (ref 180–914)

## 2021-04-25 LAB — FERRITIN: Ferritin: 20 ng/mL (ref 11–307)

## 2021-04-28 ENCOUNTER — Encounter: Payer: Self-pay | Admitting: Hematology and Oncology

## 2021-04-28 NOTE — Progress Notes (Signed)
[place] [phone]   [phone]  PROGRESS NOTE  Patient Care Team: Sonia Side., FNP as PCP - General (Family Medicine) Johnnye Sima Doroteo Bradford, MD as PCP - Infectious Diseases (Infectious Diseases) Dorothy Spark, MD (Inactive) as PCP - Cardiology (Cardiology) Thompson Grayer, MD as PCP - Electrophysiology (Cardiology) Arta Silence, MD as Consulting Physician (Gastroenterology)  Hematological/Oncological History # Iron Deficiency Anemia 2/2 to GI Bleed  10/29/2020: patient presented the ED with weakness and was found to have a Hgb of 4.1 11/01/2020: GI evaluation showed numerous bleeding/nonbleeding AVMS. Cauterization performed.  12/18/2020: Iron 32, TIBC 441, Sat 7%, Ferritin 31. Hgb 8.3, Plt 313, WBC 4.2, MCV 102.5.  01/03/2021: establish care with Dr. Lorenso Courier  2/4-2/09/2021: IV feraheme $RemoveBef'510mg'[id number]$  x 2 doses. Received 1 unit of PRBC on 01/12/2021   Interval History:  Nicole Mccormick 75 y.o. female with medical history significant for iron deficiency anemia 2/2 to GI bleed who presents for a follow up visit. The patient's last visit was on 01/31/2021. In the interim since the last visit she has been in her usual state of health.   On exam today Nicole Mccormick notes that she "feels great".  She notes that she has been working in the yard and has excellent levels of energy.  She notes occasional tiredness when she exerts herself but otherwise has no complaints.  She denies having any issues with shortness of breath.  She does endorse drinking a glass of wine approximately 3-4 times per week.  She notes that white wine is a minor choice but she is been doing this for approximately 1 year.  Her weight has been stable and she otherwise denies any fevers, chills, sweats, nausea, vomiting or diarrhea.  She denies any overt signs of bleeding or bruising.  A full 10 point ROS is listed below.  MEDICAL HISTORY:  Past Medical History:  Diagnosis Date  . Abnormal Pap  smear   . Aortic stenosis   . Arthritis   . Asthma   . AVM (arteriovenous malformation)   . Bradycardia   . Complication of anesthesia    "they have a hard time bringing me back" (11/08/2015)  . Coronary artery disease    a. diffuse CAD of RCA by cath 2016 managed medically.  . Dynamic left ventricular outflow obstruction   . GERD (gastroesophageal reflux disease)    previously on aciphex, discontinued november 2012  because patient asymptomatic, and concern about interference with HIV meds.  May try pepcid in the future if symptoms return  . Heart murmur   . Hepatitis C antibody test positive   . HIV infection ([place])    diagnosed before 2008  . Hypertension   . Hypertensive heart disease   . Iron deficiency anemia    Ferritin = 2 in november 2012, started on iron supplemenation  . Pacemaker    a. St Jude PPM 01/2018.  . Seizures ([place])   . Symptomatic anemia 08/24/2020  . Varicosities     SURGICAL HISTORY: Past Surgical History:  Procedure Laterality Date  . ABDOMINAL HYSTERECTOMY    . CARDIAC CATHETERIZATION N/A 11/08/2015   Procedure: Left Heart Cath and Coronary Angiography;  Surgeon: Jettie Booze, MD;  Location: [place] [facility];  Service: Cardiovascular;  Laterality: N/A;  . COLONOSCOPY  10/13/11   small adenoma, anal condyloma  . COLONOSCOPY  10/13/2011   Procedure: COLONOSCOPY;  Surgeon: Gatha Mayer, MD;  Location: [place];  Service: Endoscopy;  Laterality: N/A;  .  COLONOSCOPY N/A 09/14/2014   Procedure: COLONOSCOPY;  Surgeon: Arta Silence, MD;  Location: [facility] ENDOSCOPY;  Service: Endoscopy;  Laterality: N/A;  . COLONOSCOPY WITH PROPOFOL N/A 06/05/2020   Procedure: COLONOSCOPY WITH PROPOFOL;  Surgeon: Arta Silence, MD;  Location: [place];  Service: Endoscopy;  Laterality: N/A;  . ENTEROSCOPY N/A 10/31/2020   Procedure: ENTEROSCOPY;  Surgeon: Otis Brace, MD;  Location: [place] ENDOSCOPY;  Service: Gastroenterology;  Laterality: N/A;  .  ESOPHAGOGASTRODUODENOSCOPY  10/13/11   small hiatal hernia  . ESOPHAGOGASTRODUODENOSCOPY  10/13/2011   Procedure: ESOPHAGOGASTRODUODENOSCOPY (EGD);  Surgeon: Gatha Mayer, MD;  Location: [facility] ENDOSCOPY;  Service: Endoscopy;  Laterality: N/A;  . ESOPHAGOGASTRODUODENOSCOPY N/A 09/14/2014   Procedure: ESOPHAGOGASTRODUODENOSCOPY (EGD);  Surgeon: Arta Silence, MD;  Location: [facility] ENDOSCOPY;  Service: Endoscopy;  Laterality: N/A;  . ESOPHAGOGASTRODUODENOSCOPY (EGD) WITH PROPOFOL N/A 06/05/2020   Procedure: ESOPHAGOGASTRODUODENOSCOPY (EGD) WITH PROPOFOL;  Surgeon: Arta Silence, MD;  Location: [place];  Service: Endoscopy;  Laterality: N/A;  . GIVENS CAPSULE STUDY N/A 09/14/2014   Procedure: GIVENS CAPSULE STUDY;  Surgeon: Arta Silence, MD;  Location: [facility] ENDOSCOPY;  Service: Endoscopy;  Laterality: N/A;  . GIVENS CAPSULE STUDY N/A 08/26/2020   Procedure: GIVENS CAPSULE STUDY;  Surgeon: Wonda Horner, MD;  Location: [facility] ENDOSCOPY;  Service: Endoscopy;  Laterality: N/A;  . HOT HEMOSTASIS N/A 10/31/2020   Procedure: HOT HEMOSTASIS (ARGON PLASMA COAGULATION/BICAP);  Surgeon: Otis Brace, MD;  Location: [facility] ENDOSCOPY;  Service: Gastroenterology;  Laterality: N/A;  . PACEMAKER IMPLANT N/A 02/05/2018   Procedure: PACEMAKER IMPLANT;  Surgeon: Thompson Grayer, MD;  Location: [place] [facility];  Service: Cardiovascular;  Laterality: N/A;  . POLYPECTOMY  06/05/2020   Procedure: POLYPECTOMY;  Surgeon: Arta Silence, MD;  Location: [facility] ENDOSCOPY;  Service: Endoscopy;;    SOCIAL HISTORY: Social History   Socioeconomic History  . Marital status: Married    Spouse name: Not on file  . Number of children: 6  . Years of education: Not on file  . Highest education level: Not on file  Occupational History  . Occupation: retired  Tobacco Use  . Smoking status: Current Every Day Smoker    Packs/day: 1.00    Years: 60.00    Pack years: 60.00    Types: Cigarettes  . Smokeless tobacco: Never Used  .  Tobacco comment: Wants to quit. Using both the patch and gum.  Vaping Use  . Vaping Use: Never used  Substance and Sexual Activity  . Alcohol use: Yes    Alcohol/week: 10.0 standard drinks    Types: 10 Glasses of wine per week    Comment: drinking less recently, denies h/o heavy use  . Drug use: No  . Sexual activity: Yes    Partners: Male    Birth control/protection: Surgical    Comment: pt. given condoms 08/23/20  Other Topics Concern  . Not on file  Social History Narrative  . Not on file   Social Determinants of Health   Financial Resource Strain: Not on file  Food Insecurity: Not on file  Transportation Needs: Not on file  Physical Activity: Not on file  Stress: Not on file  Social Connections: Not on file  Intimate Partner Violence: Not on file    FAMILY HISTORY: Family History  Problem Relation Age of Onset  . Diabetes Mother   . Ovarian cancer Sister   . Colon cancer Sister     ALLERGIES:  is allergic to penicillins, penicillin g, and avelox [moxifloxacin hcl in nacl].  MEDICATIONS:  Current Outpatient Medications  Medication Sig Dispense Refill  . acyclovir (ZOVIRAX) 400 MG tablet Take 1 tablet (400 mg total) by mouth daily as needed (for flare ups). 30 tablet 3  . albuterol (PROAIR HFA) 108 (90 Base) MCG/ACT inhaler INHALE 2 PUFFS BY MOUTH EVERY 4 HOURS IF NEEDED FOR WHEEZING OR SHORTNESS OF BREATH (Patient taking differently: Inhale 2 puffs into the lungs every 4 (four) hours as needed for wheezing or shortness of breath. ) 8.5 g 0  . ALPRAZolam (XANAX) 0.5 MG tablet Take 0.5 mg by mouth 3 (three) times daily as needed for anxiety.    Marland Kitchen amLODipine (NORVASC) 10 MG tablet TAKE 1 TABLET BY MOUTH EVERY DAY (Patient taking differently: Take 10 mg by mouth daily. ) 90 tablet 0  . Ascorbic Acid (VITAMIN C) 1000 MG tablet Take 1,000 mg by mouth daily.    Marland Kitchen atorvastatin (LIPITOR) 40 MG tablet take 1 tablet by mouth once daily AT 6 PM (Patient taking differently: Take  40 mg by mouth every evening. ) 90 tablet 0  . BIKTARVY 50-200-25 MG TABS tablet TAKE 1 TABLET BY MOUTH EVERY DAY (Patient taking differently: Take 1 tablet by mouth daily. ) 90 tablet 3  . carvedilol (COREG) 3.125 MG tablet TAKE 1 TABLET(3.125 MG) BY MOUTH TWICE DAILY (Patient taking differently: Take 3.125 mg by mouth 2 (two) times daily with a meal. ) 180 tablet 3  . cholecalciferol (VITAMIN D3) 25 MCG (1000 UNIT) tablet Take 1,000 Units by mouth daily.    Marland Kitchen dextromethorphan (DELSYM) 30 MG/5ML liquid Take 30 mg by mouth as needed for cough.     . diazepam (VALIUM) 2 MG tablet take 1 tablet by mouth every 6 hours if needed for anxiety (Patient not taking: Reported on 04/25/2021) 30 tablet 0  . Ensure (ENSURE) Take 237 mLs by mouth 2 (two) times daily between meals. 237 mL 11  . escitalopram (LEXAPRO) 10 MG tablet Take 10 mg by mouth daily as needed (anxiety).  (Patient not taking: Reported on 04/25/2021)    . famotidine (PEPCID) 20 MG tablet Take 20 mg by mouth 2 (two) times daily.    . ferrous sulfate 325 (65 FE) MG EC tablet Take 1 tablet (325 mg total) by mouth in the morning and at bedtime. 180 tablet 0  . Flaxseed, Linseed, (FLAX SEED OIL) 1000 MG CAPS Take 1,000 mg by mouth daily.    . folic acid (FOLVITE) 1 MG tablet Take 1 tablet (1 mg total) by mouth daily. 90 tablet 1  . gabapentin (NEURONTIN) 100 MG capsule Take 100 mg by mouth 3 (three) times daily.    Marland Kitchen gabapentin (NEURONTIN) 300 MG capsule     . hydrochlorothiazide (HYDRODIURIL) 25 MG tablet Take 25 mg by mouth daily.     Marland Kitchen HYDROcodone-acetaminophen (NORCO/VICODIN) 5-325 MG tablet Take 1 tablet by mouth 2 (two) times daily as needed.    . isosorbide mononitrate (IMDUR) 60 MG 24 hr tablet Take 1 tablet (60 mg total) by mouth daily. 90 tablet 1  . lisinopril (ZESTRIL) 40 MG tablet Take 40 mg by mouth daily.    Marland Kitchen loratadine (CLARITIN) 10 MG tablet 1 tablet    . meclizine (ANTIVERT) 25 MG tablet Take 25 mg by mouth as needed for  dizziness.     . Nutritional Supplements (ENSURE ORIGINAL PO) See admin instructions.    . Omega-3 Fatty Acids (FISH OIL) 1000 MG CPDR Take 1,000 mg by mouth daily.    . ondansetron (ZOFRAN ODT)  4 MG disintegrating tablet 1 tablet on the tongue and allow to dissolve    . pantoprazole (PROTONIX) 40 MG tablet Take 1 tablet (40 mg total) by mouth daily. 90 tablet 0  . Polyethylene Glycol 1000 POWD Mix 17g (1 capful) in a drink    . spironolactone (ALDACTONE) 25 MG tablet TAKE 1 TABLET BY MOUTH EVERY DAY (Patient taking differently: Take 25 mg by mouth daily. ) 30 tablet 0  . sucralfate (CARAFATE) 1 g tablet 1 tablet on an empty stomach    . SYMBICORT 160-4.5 MCG/ACT inhaler Inhale 2 puffs into the lungs 2 (two) times daily.    . vitamin A 10000 UNIT capsule Take 10,000 Units by mouth daily.    . vitamin B-12 (CYANOCOBALAMIN) 1000 MCG tablet Take 1 tablet (1,000 mcg total) by mouth daily. 90 tablet 1   No current facility-administered medications for this visit.    REVIEW OF SYSTEMS:   Constitutional: ( - ) fevers, ( - )  chills , ( - ) night sweats Eyes: ( - ) blurriness of vision, ( - ) double vision, ( - ) watery eyes Ears, nose, mouth, throat, and face: ( - ) mucositis, ( - ) sore throat Respiratory: ( - ) cough, ( - ) dyspnea, ( - ) wheezes Cardiovascular: ( - ) palpitation, ( - ) chest discomfort, ( - ) lower extremity swelling Gastrointestinal:  ( - ) nausea, ( - ) heartburn, ( - ) change in bowel habits Skin: ( - ) abnormal skin rashes Lymphatics: ( - ) new lymphadenopathy, ( - ) easy bruising Neurological: ( - ) numbness, ( - ) tingling, ( - ) new weaknesses Behavioral/Psych: ( - ) mood change, ( - ) new changes  All other systems were reviewed with the patient and are negative.  PHYSICAL EXAMINATION:  Vitals:   04/25/21 1511  BP: 131/73  Pulse: 93  Resp: 18  Temp: 97.7 F (36.5 C)  SpO2: 97%   Filed Weights   04/25/21 1511  Weight: 128 lb 11.2 oz (58.4 kg)     GENERAL: well appearing elderly African American female. alert, no distress and comfortable SKIN: skin color, texture, turgor are normal, no rashes or significant lesions EYES: conjunctiva are pink and non-injected, sclera clear LUNGS: clear to auscultation and percussion with normal breathing effort HEART: regular rate & rhythm and no murmurs and no lower extremity edema Musculoskeletal: no cyanosis of digits and no clubbing  PSYCH: alert & oriented x 3, fluent speech NEURO: no focal motor/sensory deficits  LABORATORY DATA:  I have reviewed the data as listed CBC Latest Ref Rng & Units 04/25/2021 02/28/2021 01/31/2021  WBC 4.0 - 10.5 K/uL 4.4 4.4 5.1  Hemoglobin 12.0 - 15.0 g/dL 11.1(L) 10.8(L) 11.0(L)  Hematocrit 36.0 - 46.0 % 34.6(L) 32.9(L) 35.7(L)  Platelets 150 - 400 K/uL 208 203 215    CMP Latest Ref Rng & Units 04/25/2021 02/28/2021 01/31/2021  Glucose 70 - 99 mg/dL 106(H) 100(H) 99  BUN 8 - 23 mg/dL 29(H) 29(H) 28(H)  Creatinine 0.44 - 1.00 mg/dL 1.18(H) 0.88 1.05(H)  Sodium 135 - 145 mmol/L 141 140 143  Potassium 3.5 - 5.1 mmol/L 3.5 4.0 4.4  Chloride 98 - 111 mmol/L 105 102 108  CO2 22 - 32 mmol/L 27 27 25   Calcium 8.9 - 10.3 mg/dL 9.0 9.2 9.0  Total Protein 6.5 - 8.1 g/dL 7.0 7.8 7.4  Total Bilirubin 0.3 - 1.2 mg/dL <0.2(L) 0.3 0.3  Alkaline Phos 38 - 126  U/L 62 71 69  AST 15 - 41 U/L 18 20 23   ALT 0 - 44 U/L 9 19 13     RADIOGRAPHIC STUDIES: No results found.  ASSESSMENT & PLAN Nicole Mccormick 75 y.o. female with medical history significant for iron deficiency anemia 2/2 to GI bleed who presents for a follow up visit.   After review the labs, the records, discussion the patient the findings most consistent with an iron deficiency anemia responding to IV iron therapy.  Her hemoglobin has increased to 11.1 however it has not reached the normal levels as of yet.  This may be due to the fact that we are checking her sooner than usual due to our concern that she had a  hemoglobin of 6.4.  Hemoglobin can be expected to rise approximately 1 g/dL every 1 to 2 weeks.  We are checking her iron levels again today in order to assure that they are robust and that she has all the iron she requires for further [place].  Given the macrocytosis I would recommend that the patient d/c ETOH use while she continues vitamin B12 and folate therapy.  She also reports that she drinks approximately 1 glass of wine per night which may be partially to blame for her macrocytosis.  We will plan to have her repeat labs with clinic visit in 12 weeks The patient voiced understanding of this plan moving forward.   # Iron Deficiency Anemia 2/2 to GI Bleed   -- findings were consistent with persistent iron deficiency anemia despite adequate PO iron therapy --patient is s/p 560m IV feraheme q 7 days x 2 doses on 2/4-2/10/22.  --today will order repeat iron panel, ferritin, CBC, CMP and reticulocyte panel  --retic panel shows a marginal reticulocyte count, with reticulocytosis near adequate. This implies some continued GI blood loss. --continue PO ferrous sulfate 3241mdaily in the interim. Take with a source of Vitamin C (orange juice best)  --RTC in 3 months   #Macrocytosis --given the timing of this macrocytosis (right around when she started drinking 1 glass of wine 3-4 x per week), ETOH could be the cause of this macrocytosis.  --Hgb has stagnated at 11.1, not yet at target --less likely secondary to deficiency with folate or vitamin b12 as she has been supplementing these nutrients --potentially represent robust reticulocytosis, but ETOH seems more likely at this time.   --if no resolution with appropriate nutritional repletion and ETOH cessation may need to consider liver disease workup or bone marrow biopsy. --patient not currently on any medications known to cause macrocytosis.    No orders of the defined types were placed in this encounter.   All questions were answered.  The patient knows to call the clinic with any problems, questions or concerns.  A total of more than 30 minutes were spent on this encounter and over half of that time was spent on counseling and coordination of care as outlined above.   JoLedell PeoplesMD Department of Hematology/Oncology CoSycamoret WeUc Health Pikes Peak Regional Hospitalhone: [phone]: [phone]: [email or web address]  04/28/2021 12:21 PM

## 2021-04-30 ENCOUNTER — Telehealth: Payer: Self-pay | Admitting: Hematology and Oncology

## 2021-04-30 NOTE — Telephone Encounter (Signed)
Scheduled appt per 5/21 sch msg. Pt aware.

## 2021-05-03 ENCOUNTER — Other Ambulatory Visit: Payer: Self-pay

## 2021-05-03 DIAGNOSIS — E43 Unspecified severe protein-calorie malnutrition: Secondary | ICD-10-CM

## 2021-05-03 MED ORDER — ENSURE PO LIQD: 237.0000 mL | Freq: Two times a day (BID) | ORAL | 5 refills | Status: DC

## 2021-05-03 NOTE — Progress Notes (Signed)
[facility] called requesting faxed Ensure prescription. RN faxed to [employer] at [phone].   Beryle Flock, RN

## 2021-05-23 ENCOUNTER — Ambulatory Visit (INDEPENDENT_AMBULATORY_CARE_PROVIDER_SITE_OTHER): Payer: 59

## 2021-05-23 DIAGNOSIS — I495 Sick sinus syndrome: Secondary | ICD-10-CM

## 2021-05-25 ENCOUNTER — Ambulatory Visit: Payer: 59 | Admitting: Infectious Diseases

## 2021-05-25 LAB — CUP PACEART REMOTE DEVICE CHECK
Battery Remaining Longevity: 122 mo
Battery Remaining Percentage: 95.5 %
Battery Voltage: 2.99 V
Brady Statistic AP VP Percent: 1.4 %
Brady Statistic AP VS Percent: 93 %
Brady Statistic AS VP Percent: 1 %
Brady Statistic AS VS Percent: 5.4 %
Brady Statistic RA Percent Paced: 93 %
Brady Statistic RV Percent Paced: 1.5 %
Date Time Interrogation Session: 20220615222609
Implantable Lead Implant Date: 20190228
Implantable Lead Implant Date: 20190228
Implantable Lead Location: 753859
Implantable Lead Location: 753860
Implantable Pulse Generator Implant Date: 20190228
Lead Channel Impedance Value: 350 Ohm
Lead Channel Impedance Value: 510 Ohm
Lead Channel Pacing Threshold Amplitude: 0.5 V
Lead Channel Pacing Threshold Amplitude: 0.625 V
Lead Channel Pacing Threshold Pulse Width: 0.5 ms
Lead Channel Pacing Threshold Pulse Width: 0.5 ms
Lead Channel Sensing Intrinsic Amplitude: 4 mV
Lead Channel Sensing Intrinsic Amplitude: 9.6 mV
Lead Channel Setting Pacing Amplitude: 0.875
Lead Channel Setting Pacing Amplitude: 1.5 V
Lead Channel Setting Pacing Pulse Width: 0.5 ms
Lead Channel Setting Sensing Sensitivity: 2 mV
Pulse Gen Model: 2272
Pulse Gen Serial Number: 8996997

## 2021-06-01 ENCOUNTER — Encounter: Payer: Self-pay | Admitting: Hematology and Oncology

## 2021-06-01 ENCOUNTER — Other Ambulatory Visit: Payer: Self-pay

## 2021-06-01 ENCOUNTER — Ambulatory Visit
Admission: RE | Admit: 2021-06-01 | Discharge: 2021-06-01 | Disposition: A | Discharge status: Home / Self Care | Payer: 59 | Source: Ambulatory Visit | Attending: Family | Admitting: Family

## 2021-06-01 ENCOUNTER — Other Ambulatory Visit: Payer: Self-pay | Admitting: Family

## 2021-06-01 DIAGNOSIS — M549 Dorsalgia, unspecified: Secondary | ICD-10-CM

## 2021-06-06 ENCOUNTER — Other Ambulatory Visit: Payer: Self-pay | Admitting: Family

## 2021-06-06 DIAGNOSIS — B20 Human immunodeficiency virus [HIV] disease: Secondary | ICD-10-CM

## 2021-06-07 ENCOUNTER — Ambulatory Visit: Payer: 59 | Admitting: Infectious Diseases

## 2021-06-14 NOTE — Progress Notes (Signed)
Remote pacemaker transmission.   

## 2021-07-18 ENCOUNTER — Other Ambulatory Visit: Payer: Self-pay

## 2021-07-18 ENCOUNTER — Other Ambulatory Visit: Payer: Self-pay | Admitting: Hematology and Oncology

## 2021-07-18 DIAGNOSIS — E43 Unspecified severe protein-calorie malnutrition: Secondary | ICD-10-CM

## 2021-07-18 MED ORDER — ENSURE PO LIQD: 237.0000 mL | Freq: Two times a day (BID) | ORAL | 5 refills | Status: DC

## 2021-08-01 ENCOUNTER — Other Ambulatory Visit: Payer: Self-pay | Admitting: Hematology and Oncology

## 2021-08-01 ENCOUNTER — Ambulatory Visit (HOSPITAL_COMMUNITY)
Admission: RE | Admit: 2021-08-01 | Discharge: 2021-08-01 | Disposition: A | Discharge status: Home / Self Care | Payer: 59 | Source: Ambulatory Visit | Attending: Hematology and Oncology | Admitting: Hematology and Oncology

## 2021-08-01 ENCOUNTER — Inpatient Hospital Stay (HOSPITAL_BASED_OUTPATIENT_CLINIC_OR_DEPARTMENT_OTHER): Payer: 59 | Admitting: Hematology and Oncology

## 2021-08-01 ENCOUNTER — Inpatient Hospital Stay: Payer: 59 | Attending: Hematology and Oncology

## 2021-08-01 ENCOUNTER — Other Ambulatory Visit: Payer: Self-pay

## 2021-08-01 VITALS — BP 118/81 | HR 81 | Temp 98.1°F | Resp 18 | Wt 128.3 lb

## 2021-08-01 DIAGNOSIS — D7589 Other specified diseases of blood and blood-forming organs: Secondary | ICD-10-CM

## 2021-08-01 DIAGNOSIS — G40909 Epilepsy, unspecified, not intractable, without status epilepticus: Secondary | ICD-10-CM | POA: Insufficient documentation

## 2021-08-01 DIAGNOSIS — E538 Deficiency of other specified B group vitamins: Secondary | ICD-10-CM | POA: Insufficient documentation

## 2021-08-01 DIAGNOSIS — M79604 Pain in right leg: Secondary | ICD-10-CM | POA: Insufficient documentation

## 2021-08-01 DIAGNOSIS — J45909 Unspecified asthma, uncomplicated: Secondary | ICD-10-CM | POA: Insufficient documentation

## 2021-08-01 DIAGNOSIS — Z79899 Other long term (current) drug therapy: Secondary | ICD-10-CM | POA: Diagnosis not present

## 2021-08-01 DIAGNOSIS — I2510 Atherosclerotic heart disease of native coronary artery without angina pectoris: Secondary | ICD-10-CM | POA: Insufficient documentation

## 2021-08-01 DIAGNOSIS — K219 Gastro-esophageal reflux disease without esophagitis: Secondary | ICD-10-CM | POA: Insufficient documentation

## 2021-08-01 DIAGNOSIS — M7989 Other specified soft tissue disorders: Secondary | ICD-10-CM | POA: Diagnosis present

## 2021-08-01 DIAGNOSIS — D500 Iron deficiency anemia secondary to blood loss (chronic): Secondary | ICD-10-CM | POA: Insufficient documentation

## 2021-08-01 DIAGNOSIS — Z8041 Family history of malignant neoplasm of ovary: Secondary | ICD-10-CM | POA: Diagnosis not present

## 2021-08-01 DIAGNOSIS — F17210 Nicotine dependence, cigarettes, uncomplicated: Secondary | ICD-10-CM | POA: Insufficient documentation

## 2021-08-01 DIAGNOSIS — R011 Cardiac murmur, unspecified: Secondary | ICD-10-CM | POA: Diagnosis not present

## 2021-08-01 DIAGNOSIS — B20 Human immunodeficiency virus [HIV] disease: Secondary | ICD-10-CM | POA: Diagnosis not present

## 2021-08-01 DIAGNOSIS — I350 Nonrheumatic aortic (valve) stenosis: Secondary | ICD-10-CM | POA: Diagnosis not present

## 2021-08-01 DIAGNOSIS — Z800 Family history of malignant neoplasm of digestive organs: Secondary | ICD-10-CM | POA: Diagnosis not present

## 2021-08-01 LAB — CBC WITH DIFFERENTIAL (CANCER CENTER ONLY)
Abs Immature Granulocytes: 0.01 10*3/uL (ref 0.00–0.07)
Basophils Absolute: 0 10*3/uL (ref 0.0–0.1)
Basophils Relative: 1 %
Eosinophils Absolute: 0.1 10*3/uL (ref 0.0–0.5)
Eosinophils Relative: 2 %
HCT: 36.5 % (ref 36.0–46.0)
Hemoglobin: 12.1 g/dL (ref 12.0–15.0)
Immature Granulocytes: 0 %
Lymphocytes Relative: 27 %
Lymphs Abs: 1.2 10*3/uL (ref 0.7–4.0)
MCH: 34.8 pg — ABNORMAL HIGH (ref 26.0–34.0)
MCHC: 33.2 g/dL (ref 30.0–36.0)
MCV: 104.9 fL — ABNORMAL HIGH (ref 80.0–100.0)
Monocytes Absolute: 0.4 10*3/uL (ref 0.1–1.0)
Monocytes Relative: 9 %
Neutro Abs: 2.8 10*3/uL (ref 1.7–7.7)
Neutrophils Relative %: 61 %
Platelet Count: 223 10*3/uL (ref 150–400)
RBC: 3.48 MIL/uL — ABNORMAL LOW (ref 3.87–5.11)
RDW: 13.6 % (ref 11.5–15.5)
WBC Count: 4.5 10*3/uL (ref 4.0–10.5)
nRBC: 0 % (ref 0.0–0.2)

## 2021-08-01 LAB — CMP (CANCER CENTER ONLY)
ALT: 9 U/L (ref 0–44)
AST: 15 U/L (ref 15–41)
Albumin: 4 g/dL (ref 3.5–5.0)
Alkaline Phosphatase: 68 U/L (ref 38–126)
Anion gap: 9 (ref 5–15)
BUN: 23 mg/dL (ref 8–23)
CO2: 27 mmol/L (ref 22–32)
Calcium: 9.5 mg/dL (ref 8.9–10.3)
Chloride: 103 mmol/L (ref 98–111)
Creatinine: 1.04 mg/dL — ABNORMAL HIGH (ref 0.44–1.00)
GFR, Estimated: 55 mL/min — ABNORMAL LOW (ref >60)
Glucose, Bld: 96 mg/dL (ref 70–99)
Potassium: 4 mmol/L (ref 3.5–5.1)
Sodium: 139 mmol/L (ref 135–145)
Total Bilirubin: 0.4 mg/dL (ref 0.3–1.2)
Total Protein: 7.9 g/dL (ref 6.5–8.1)

## 2021-08-01 LAB — RETIC PANEL
Immature Retic Fract: 20.7 % — ABNORMAL HIGH (ref 2.3–15.9)
RBC.: 3.47 MIL/uL — ABNORMAL LOW (ref 3.87–5.11)
Retic Count, Absolute: 97.2 10*3/uL (ref 19.0–186.0)
Retic Ct Pct: 2.8 % (ref 0.4–3.1)
Reticulocyte Hemoglobin: 36.8 pg (ref >27.9)

## 2021-08-01 LAB — IRON AND TIBC
Iron: 162 ug/dL — ABNORMAL HIGH (ref 41–142)
Saturation Ratios: 41 % (ref 21–57)
TIBC: 395 ug/dL (ref 236–444)
UIBC: 233 ug/dL (ref 120–384)

## 2021-08-01 LAB — FOLATE: Folate: 80.2 ng/mL (ref >5.9)

## 2021-08-01 LAB — VITAMIN B12: Vitamin B-12: 1087 pg/mL — ABNORMAL HIGH (ref 180–914)

## 2021-08-01 LAB — LACTATE DEHYDROGENASE: LDH: 194 U/L — ABNORMAL HIGH (ref 98–192)

## 2021-08-01 LAB — FERRITIN: Ferritin: 18 ng/mL (ref 11–307)

## 2021-08-01 NOTE — Progress Notes (Signed)
[place] [phone]) [phone]   [phone]  PROGRESS NOTE  Patient Care Team: Sonia Side., FNP as PCP - General (Family Medicine) Johnnye Sima Doroteo Bradford, MD as PCP - Infectious Diseases (Infectious Diseases) Dorothy Spark, MD (Inactive) as PCP - Cardiology (Cardiology) Thompson Grayer, MD as PCP - Electrophysiology (Cardiology) Arta Silence, MD as Consulting Physician (Gastroenterology)  Hematological/Oncological History # Iron Deficiency Anemia 2/2 to GI Bleed  10/29/2020: patient presented the ED with weakness and was found to have a Hgb of 4.1 11/01/2020: GI evaluation showed numerous bleeding/nonbleeding AVMS. Cauterization performed.  12/18/2020: Iron 32, TIBC 441, Sat 7%, Ferritin 31. Hgb 8.3, Plt 313, WBC 4.2, MCV 102.5.  01/03/2021: establish care with Dr. Lorenso Mccormick  2/4-2/09/2021: IV feraheme 531m x 2 doses. Received 1 unit of PRBC on 01/12/2021  08/01/2021: WBC 4.5, Hgb 12.1, MCV 104.9, Plt 223  Interval History:  [name]75y.o. female with medical history significant for iron deficiency anemia 2/2 to GI bleed who presents for a follow up visit. The patient's last visit was on 04/25/2021. In the interim since the last visit she has been in her usual state of health.   On exam today Nicole Mccormick she is struggling with pretty severe right lower extremity pain.  She has been started last Monday with swelling and pain is currently a 10 out of 10.  She reports that it starts at the back of her knee and goes all the way down to her ankle.  She is not currently having any chest pain or shortness of breath and is having no problems with her left leg.  She notes that she has been cutting back on her alcohol consumption and is down to 2 glasses of wine per week.  She notes otherwise that her energy is quite good and her appetite has been good.  Her weight has been stable and she otherwise denies any fevers, chills, sweats, nausea, vomiting or diarrhea.   She denies any overt signs of bleeding or bruising.  A full 10 point ROS is listed below.  MEDICAL HISTORY:  Past Medical History:  Diagnosis Date   Abnormal Pap smear    Aortic stenosis    Arthritis    Asthma    AVM (arteriovenous malformation)    Bradycardia    Complication of anesthesia    "they have a hard time bringing me back" (11/08/2015)   Coronary artery disease    a. diffuse CAD of RCA by cath 2016 managed medically.   Dynamic left ventricular outflow obstruction    GERD (gastroesophageal reflux disease)    previously on aciphex, discontinued november 2012  because patient asymptomatic, and concern about interference with HIV meds.  May try pepcid in the future if symptoms return   Heart murmur    Hepatitis C antibody test positive    HIV infection ([place]    diagnosed before 2008   Hypertension    Hypertensive heart disease    Iron deficiency anemia    Ferritin = 2 in november 2012, started on iron supplemenation   Pacemaker    a. St Jude PPM 01/2018.   Seizures ([facility]    Symptomatic anemia 08/24/2020   Varicosities     SURGICAL HISTORY: Past Surgical History:  Procedure Laterality Date   ABDOMINAL HYSTERECTOMY     CARDIAC CATHETERIZATION N/A 11/08/2015   Procedure: Left Heart Cath and Coronary Angiography;  Surgeon: JJettie Booze MD;  [facility];  Service:  Cardiovascular;  Laterality: N/A;   COLONOSCOPY  10/13/11   small adenoma, anal condyloma   COLONOSCOPY  10/13/2011   Procedure: COLONOSCOPY;  Surgeon: Gatha Mayer, MD;  Location: [place];  Service: Endoscopy;  Laterality: N/A;   COLONOSCOPY N/A 09/14/2014   Procedure: COLONOSCOPY;  Surgeon: Arta Silence, MD;  Location: [facility] ENDOSCOPY;  Service: Endoscopy;  Laterality: N/A;   COLONOSCOPY WITH PROPOFOL N/A 06/05/2020   Procedure: COLONOSCOPY WITH PROPOFOL;  Surgeon: Arta Silence, MD;  Location: [place];  Service: Endoscopy;  Laterality: N/A;   ENTEROSCOPY N/A 10/31/2020    Procedure: ENTEROSCOPY;  Surgeon: Otis Brace, MD;  Location: [facility] ENDOSCOPY;  Service: Gastroenterology;  Laterality: N/A;   ESOPHAGOGASTRODUODENOSCOPY  10/13/11   small hiatal hernia   ESOPHAGOGASTRODUODENOSCOPY  10/13/2011   Procedure: ESOPHAGOGASTRODUODENOSCOPY (EGD);  Surgeon: Gatha Mayer, MD;  Location: [facility] ENDOSCOPY;  Service: Endoscopy;  Laterality: N/A;   ESOPHAGOGASTRODUODENOSCOPY N/A 09/14/2014   Procedure: ESOPHAGOGASTRODUODENOSCOPY (EGD);  Surgeon: Arta Silence, MD;  Location: [facility] ENDOSCOPY;  Service: Endoscopy;  Laterality: N/A;   ESOPHAGOGASTRODUODENOSCOPY (EGD) WITH PROPOFOL N/A 06/05/2020   Procedure: ESOPHAGOGASTRODUODENOSCOPY (EGD) WITH PROPOFOL;  Surgeon: Arta Silence, MD;  Location: [place];  Service: Endoscopy;  Laterality: N/A;   GIVENS CAPSULE STUDY N/A 09/14/2014   Procedure: GIVENS CAPSULE STUDY;  Surgeon: Arta Silence, MD;  Location: [facility] ENDOSCOPY;  Service: Endoscopy;  Laterality: N/A;   GIVENS CAPSULE STUDY N/A 08/26/2020   Procedure: GIVENS CAPSULE STUDY;  Surgeon: Wonda Horner, MD;  Location: [facility] ENDOSCOPY;  Service: Endoscopy;  Laterality: N/A;   HOT HEMOSTASIS N/A 10/31/2020   Procedure: HOT HEMOSTASIS (ARGON PLASMA COAGULATION/BICAP);  Surgeon: Otis Brace, MD;  Location: [facility] ENDOSCOPY;  Service: Gastroenterology;  Laterality: N/A;   PACEMAKER IMPLANT N/A 02/05/2018   Procedure: PACEMAKER IMPLANT;  Surgeon: Thompson Grayer, MD;  Location: [place] CV LAB;  Service: Cardiovascular;  Laterality: N/A;   POLYPECTOMY  06/05/2020   Procedure: POLYPECTOMY;  Surgeon: Arta Silence, MD;  Location: [facility] ENDOSCOPY;  Service: Endoscopy;;    SOCIAL HISTORY: Social History   Socioeconomic History   Marital status: Married    Spouse name: Not on file   Number of children: 6   Years of education: Not on file   Highest education level: Not on file  Occupational History   Occupation: retired  Tobacco Use   Smoking status: Every Day    Packs/day: 1.00     Years: 60.00    Pack years: 60.00    Types: Cigarettes   Smokeless tobacco: Never   Tobacco comments:    Wants to quit. Using both the patch and gum.  Vaping Use   Vaping Use: Never used  Substance and Sexual Activity   Alcohol use: Yes    Alcohol/week: 10.0 standard drinks    Types: 10 Glasses of wine per week    Comment: drinking less recently, denies h/o heavy use   Drug use: No   Sexual activity: Yes    Partners: Male    Birth control/protection: Surgical    Comment: pt. given condoms 08/23/20  Other Topics Concern   Not on file  Social History Narrative   Not on file   Social Determinants of Health   Financial Resource Strain: Not on file  Food Insecurity: Not on file  Transportation Needs: Not on file  Physical Activity: Not on file  Stress: Not on file  Social Connections: Not on file  Intimate Partner Violence: Not on file    FAMILY HISTORY: Family History  Problem Relation Age of Onset   Diabetes Mother    Ovarian cancer Sister    Colon cancer Sister     ALLERGIES:  is allergic to penicillins, penicillin g, and avelox [moxifloxacin hcl in nacl].  MEDICATIONS:  Current Outpatient Medications  Medication Sig Dispense Refill   acyclovir (ZOVIRAX) 400 MG tablet Take 1 tablet (400 mg total) by mouth daily as needed (for flare ups). 30 tablet 3   albuterol (PROAIR HFA) 108 (90 Base) MCG/ACT inhaler INHALE 2 PUFFS BY MOUTH EVERY 4 HOURS IF NEEDED FOR WHEEZING OR SHORTNESS OF BREATH (Patient taking differently: Inhale 2 puffs into the lungs every 4 (four) hours as needed for wheezing or shortness of breath. ) 8.5 g 0   ALPRAZolam (XANAX) 0.5 MG tablet Take 0.5 mg by mouth 3 (three) times daily as needed for anxiety.     amLODipine (NORVASC) 10 MG tablet TAKE 1 TABLET BY MOUTH EVERY DAY (Patient taking differently: Take 10 mg by mouth daily. ) 90 tablet 0   Ascorbic Acid (VITAMIN C) 1000 MG tablet Take 1,000 mg by mouth daily.     atorvastatin (LIPITOR) 40 MG  tablet take 1 tablet by mouth once daily AT 6 PM (Patient taking differently: Take 40 mg by mouth every evening. ) 90 tablet 0   BIKTARVY 50-200-25 MG TABS tablet TAKE 1 TABLET BY MOUTH EVERY DAY (Patient taking differently: Take 1 tablet by mouth daily. ) 90 tablet 3   carvedilol (COREG) 3.125 MG tablet TAKE 1 TABLET(3.125 MG) BY MOUTH TWICE DAILY (Patient taking differently: Take 3.125 mg by mouth 2 (two) times daily with a meal. ) 180 tablet 3   cholecalciferol (VITAMIN D3) 25 MCG (1000 UNIT) tablet Take 1,000 Units by mouth daily.     dextromethorphan (DELSYM) 30 MG/5ML liquid Take 30 mg by mouth as needed for cough.      diazepam (VALIUM) 2 MG tablet take 1 tablet by mouth every 6 hours if needed for anxiety (Patient not taking: Reported on 04/25/2021) 30 tablet 0   Ensure (ENSURE) Take 237 mLs by mouth 2 (two) times daily between meals. 237 mL 5   escitalopram (LEXAPRO) 10 MG tablet Take 10 mg by mouth daily as needed (anxiety).  (Patient not taking: Reported on 04/25/2021)     famotidine (PEPCID) 20 MG tablet Take 20 mg by mouth 2 (two) times daily.     ferrous sulfate 325 (65 FE) MG EC tablet Take 1 tablet (325 mg total) by mouth in the morning and at bedtime. 180 tablet 0   Flaxseed, Linseed, (FLAX SEED OIL) 1000 MG CAPS Take 1,000 mg by mouth daily.     folic acid (FOLVITE) 1 MG tablet TAKE 1 TABLET BY MOUTH EVERY DAY 90 tablet 1   gabapentin (NEURONTIN) 100 MG capsule Take 100 mg by mouth 3 (three) times daily.     gabapentin (NEURONTIN) 300 MG capsule      hydrochlorothiazide (HYDRODIURIL) 25 MG tablet Take 25 mg by mouth daily.      HYDROcodone-acetaminophen (NORCO/VICODIN) 5-325 MG tablet Take 1 tablet by mouth 2 (two) times daily as needed.     isosorbide mononitrate (IMDUR) 60 MG 24 hr tablet Take 1 tablet (60 mg total) by mouth daily. 90 tablet 1   lisinopril (ZESTRIL) 40 MG tablet Take 40 mg by mouth daily.     loratadine (CLARITIN) 10 MG tablet 1 tablet     meclizine (ANTIVERT) 25  MG tablet Take 25 mg by mouth as needed  for dizziness.      Omega-3 Fatty Acids (FISH OIL) 1000 MG CPDR Take 1,000 mg by mouth daily.     ondansetron (ZOFRAN ODT) 4 MG disintegrating tablet 1 tablet on the tongue and allow to dissolve     pantoprazole (PROTONIX) 40 MG tablet Take 1 tablet (40 mg total) by mouth daily. 90 tablet 0   Polyethylene Glycol 1000 POWD Mix 17g (1 capful) in a drink     spironolactone (ALDACTONE) 25 MG tablet TAKE 1 TABLET BY MOUTH EVERY DAY (Patient taking differently: Take 25 mg by mouth daily. ) 30 tablet 0   sucralfate (CARAFATE) 1 g tablet 1 tablet on an empty stomach     SYMBICORT 160-4.5 MCG/ACT inhaler Inhale 2 puffs into the lungs 2 (two) times daily.     vitamin A 10000 UNIT capsule Take 10,000 Units by mouth daily.     vitamin B-12 (CYANOCOBALAMIN) 1000 MCG tablet TAKE 1 TABLET BY MOUTH EVERY DAY 90 tablet 1   No current facility-administered medications for this visit.    REVIEW OF SYSTEMS:   Constitutional: ( - ) fevers, ( - )  chills , ( - ) night sweats Eyes: ( - ) blurriness of vision, ( - ) double vision, ( - ) watery eyes Ears, nose, mouth, throat, and face: ( - ) mucositis, ( - ) sore throat Respiratory: ( - ) cough, ( - ) dyspnea, ( - ) wheezes Cardiovascular: ( - ) palpitation, ( - ) chest discomfort, ( - ) lower extremity swelling Gastrointestinal:  ( - ) nausea, ( - ) heartburn, ( - ) change in bowel habits Skin: ( - ) abnormal skin rashes Lymphatics: ( - ) new lymphadenopathy, ( - ) easy bruising Neurological: ( - ) numbness, ( - ) tingling, ( - ) new weaknesses Behavioral/Psych: ( - ) mood change, ( - ) new changes  All other systems were reviewed with the patient and are negative.  PHYSICAL EXAMINATION:  Vitals:   08/01/21 0946  BP: 118/81  Pulse: 81  Resp: 18  Temp: 98.1 F (36.7 C)  SpO2: 99%   Filed Weights   08/01/21 0946  Weight: 128 lb 4.8 oz (58.2 kg)    GENERAL: well appearing elderly African American female. alert,  no distress and comfortable SKIN: skin color, texture, turgor are normal, no rashes or significant lesions EYES: conjunctiva are pink and non-injected, sclera clear LUNGS: clear to auscultation and percussion with normal breathing effort HEART: regular rate & rhythm and no murmurs and no lower extremity edema Musculoskeletal: no cyanosis of digits and no clubbing  PSYCH: alert & oriented x 3, fluent speech NEURO: no focal motor/sensory deficits  LABORATORY DATA:  I have reviewed the data as listed CBC Latest Ref Rng & Units 08/01/2021 04/25/2021 02/28/2021  WBC 4.0 - 10.5 K/uL 4.5 4.4 4.4  Hemoglobin 12.0 - 15.0 g/dL 12.1 11.1(L) 10.8(L)  Hematocrit 36.0 - 46.0 % 36.5 34.6(L) 32.9(L)  Platelets 150 - 400 K/uL 223 208 203    CMP Latest Ref Rng & Units 08/01/2021 04/25/2021 02/28/2021  Glucose 70 - 99 mg/dL 96 106(H) 100(H)  BUN 8 - 23 mg/dL 23 29(H) 29(H)  Creatinine 0.44 - 1.00 mg/dL 1.04(H) 1.18(H) 0.88  Sodium 135 - 145 mmol/L 139 141 140  Potassium 3.5 - 5.1 mmol/L 4.0 3.5 4.0  Chloride 98 - 111 mmol/L 103 105 102  CO2 22 - 32 mmol/L _0 Calcium 8.9 - 10.3 mg/dL 9.5 9.0 9.2  Total Protein 6.5 - 8.1 g/dL 7.9 7.0 7.8  Total Bilirubin 0.3 - 1.2 mg/dL 0.4 <0.2(L) 0.3  Alkaline Phos 38 - 126 U/L 68 62 71  AST 15 - 41 U/L _0 ALT 0 - 44 U/L _1 RADIOGRAPHIC STUDIES: No results found.  ASSESSMENT & PLAN Germani Gavilanes 75 y.o. female with medical history significant for iron deficiency anemia 2/2 to GI bleed who presents for a follow up visit.   After review the labs, the records, discussion the patient the findings most consistent with an iron deficiency anemia responding to IV iron therapy.  Her hemoglobin has increased to 11.1 however it has not reached the normal levels as of yet.  This may be due to the fact that we are checking her sooner than usual due to our concern that she had a hemoglobin of 6.4.  Hemoglobin can be expected to rise approximately 1 g/dL every 1 to  2 weeks.  We are checking her iron levels again today in order to assure that they are robust and that she has all the iron she requires for further [place]'s.  Given the macrocytosis I would recommend that the patient d/c ETOH use while she continues vitamin B12 and folate therapy.  She also reports that she drinks approximately 1 glass of wine per night which may be partially to blame for her macrocytosis.  We will plan to have her repeat labs with clinic visit in 6 months The patient voiced understanding of this plan moving forward.  # Iron Deficiency Anemia 2/2 to GI Bleed   -- findings were consistent with persistent iron deficiency anemia despite adequate PO iron therapy --Hgb today improved to 12.1 --patient is s/p 579m IV feraheme q 7 days x 2 doses on 2/4-2/10/22.  --today will order repeat iron panel, ferritin, CBC, CMP and reticulocyte panel  --retic panel shows a marginal reticulocyte count, with reticulocytosis near adequate. This implies some continued GI blood loss. --continue PO ferrous sulfate 3219mdaily in the interim. Take with a source of Vitamin C (orange juice best)  --RTC in 6 months   #Right Lower Extremity Swelling/Pain -- Findings concerning for right lower extremity DVT. --We will order an ultrasound of the lower extremity to assess for clot --If no clear evidence of a DVT would recommend patient be evaluated by her PCP or ED for other possible causes.  --RTC pending results of [place]  #Macrocytosis --given the timing of this macrocytosis (right around when she started drinking 1 glass of wine 3-4 x per week), ETOH could be the cause of this macrocytosis. She has decreased to 2 glasses per week.  --Hgb improved to 12.1, currently WNL. --less likely secondary to deficiency with folate or vitamin b12 as she has been supplementing these nutrients --potentially represent robust reticulocytosis, but ETOH seems more likely at this time.   --if no resolution with  appropriate nutritional repletion and ETOH cessation may need to consider liver disease workup or bone marrow biopsy. --patient not currently on any medications known to cause macrocytosis.    No orders of the defined types were placed in this encounter.  All questions were answered. The patient knows to call the clinic with any problems, questions or concerns.  A total of more than 30 minutes were spent on this encounter and over half of that time was spent on counseling and coordination of care as outlined above.   JoLedell PeoplesMD Department of Hematology/Oncology  [place] [facility] Phone: [phone] Pager: [phone] Email: [email or web address]  08/01/2021 10:18 AM

## 2021-08-01 NOTE — Progress Notes (Signed)
Right lower extremity venous duplex has been completed. Preliminary results can be found in CV Proc through chart review.  Results were given to [facility] at Dr. Libby Maw office.  08/01/21 10:49 AM [name] RVT

## 2021-08-02 LAB — KAPPA/LAMBDA LIGHT CHAINS
Kappa free light chain: 52.1 mg/L — ABNORMAL HIGH (ref 3.3–19.4)
Kappa, lambda light chain ratio: 1.65 (ref 0.26–1.65)
Lambda free light chains: 31.6 mg/L — ABNORMAL HIGH (ref 5.7–26.3)

## 2021-08-03 LAB — METHYLMALONIC ACID, SERUM: Methylmalonic Acid, Quantitative: 122 nmol/L (ref 0–378)

## 2021-08-06 LAB — MULTIPLE MYELOMA PANEL, SERUM
Albumin SerPl Elph-Mcnc: 3.6 g/dL (ref 2.9–4.4)
Albumin/Glob SerPl: 1.1 (ref 0.7–1.7)
Alpha 1: 0.2 g/dL (ref 0.0–0.4)
Alpha2 Glob SerPl Elph-Mcnc: 0.7 g/dL (ref 0.4–1.0)
B-Globulin SerPl Elph-Mcnc: 1 g/dL (ref 0.7–1.3)
Gamma Glob SerPl Elph-Mcnc: 1.5 g/dL (ref 0.4–1.8)
Globulin, Total: 3.3 g/dL (ref 2.2–3.9)
IgA: 166 mg/dL (ref 64–422)
IgG (Immunoglobin G), Serum: 1651 mg/dL — ABNORMAL HIGH (ref 586–1602)
IgM (Immunoglobulin M), Srm: 39 mg/dL (ref 26–217)
Total Protein ELP: 6.9 g/dL (ref 6.0–8.5)

## 2021-08-10 ENCOUNTER — Encounter: Payer: Self-pay | Admitting: Hematology and Oncology

## 2021-08-15 ENCOUNTER — Encounter: Payer: Self-pay | Admitting: Hematology and Oncology

## 2021-08-17 ENCOUNTER — Ambulatory Visit (INDEPENDENT_AMBULATORY_CARE_PROVIDER_SITE_OTHER): Payer: Medicare (Managed Care) | Admitting: Infectious Diseases

## 2021-08-17 ENCOUNTER — Encounter: Payer: Self-pay | Admitting: Infectious Diseases

## 2021-08-17 ENCOUNTER — Other Ambulatory Visit: Payer: Self-pay

## 2021-08-17 VITALS — BP 104/76 | HR 71 | Temp 98.0°F | Wt 128.0 lb

## 2021-08-17 DIAGNOSIS — Z79899 Other long term (current) drug therapy: Secondary | ICD-10-CM | POA: Diagnosis not present

## 2021-08-17 DIAGNOSIS — I350 Nonrheumatic aortic (valve) stenosis: Secondary | ICD-10-CM

## 2021-08-17 DIAGNOSIS — M25561 Pain in right knee: Secondary | ICD-10-CM | POA: Diagnosis not present

## 2021-08-17 DIAGNOSIS — F17200 Nicotine dependence, unspecified, uncomplicated: Secondary | ICD-10-CM

## 2021-08-17 DIAGNOSIS — B20 Human immunodeficiency virus [HIV] disease: Secondary | ICD-10-CM

## 2021-08-17 DIAGNOSIS — Z113 Encounter for screening for infections with a predominantly sexual mode of transmission: Secondary | ICD-10-CM

## 2021-08-17 NOTE — Assessment & Plan Note (Signed)
Will check her labs today She appears to be doing well.  She is married.  Will see her back in 6 months.  Defer pain mgmt until Ortho eval.

## 2021-08-17 NOTE — Assessment & Plan Note (Signed)
Will have her seen by ortho.  u/s (-) Her pain expends below her knee as well.

## 2021-08-17 NOTE — Assessment & Plan Note (Signed)
Will repeat her TTE Have her see Dr Meda Coffee

## 2021-08-17 NOTE — Addendum Note (Signed)
Addended by: Leatrice Jewels on: 08/17/2021 12:07 PM   Modules accepted: Orders

## 2021-08-17 NOTE — Assessment & Plan Note (Signed)
I encouraged her multiple times to quit smoking.  She says she mostly lets them sit and burn.

## 2021-08-17 NOTE — Progress Notes (Signed)
Subjective:    Patient ID: Nicole Mccormick, female  DOB: January 15, 1946, 75 y.o.        MRN: [id number]   HPI 75 yo F with hx of HTN and HIV+. She is also long term smoker.  She had episodes of bradycardia, and had pacer placed 02-05-18.  Was changed to biktarvy 06-2018 from tivicay-descovey.  Has still been having joint pains. Currently states that her R knee pain has been so bad she is unable to sleep. She has had no f/c, but has had mild swelling in her knee. She recently had a u/s 08-01-21 which did not show DVT.   Has been hungry but can't eat. Has been using ensure. Has lost #19. Was prev taking hydrocodone prn for pain "all over".  Now living in single story dwelling.    She was in hospital June 2021 with anemia- she had colonoscopy (4-5 polyps, hemmeroids) and EGD (avm).  She still has GI f/u as well as Heme f/u for iron infusions.   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 Lost youngest son last fall.   HIV 1 RNA Quant (copies/mL)  Date Value  08/23/2020 <20 DETECTED  07/06/2018 <20 NOT DETECTED  11/10/2017 <20 NOT DETECTED   CD4 T Cell Abs (/uL)  Date Value  08/23/2020 395 (L)  06/05/2020 389 (L)  07/06/2018 400     Health Maintenance  Topic Date Due   Zoster Vaccines- Shingrix (1 of 2) Never done   DEXA SCAN  Never done   COVID-19 Vaccine (4 - Booster for Pfizer series) 11/02/2020   INFLUENZA VACCINE  07/09/2021   TETANUS/TDAP  02/27/2023   COLONOSCOPY (Pts 45-40yr Insurance coverage will need to be confirmed)  06/06/2027   Hepatitis C Screening  Completed   PNA vac Low Risk Adult  Completed   HPV VACCINES  Aged Out      Review of Systems  Constitutional:  Negative for chills, fever and weight loss.  Respiratory:  Negative for cough and shortness of breath.   Gastrointestinal:  Negative for constipation and diarrhea.  Genitourinary:  Negative for dysuria.  Musculoskeletal:  Positive for joint pain.  Psychiatric/Behavioral:  The patient has insomnia.     Please see HPI. All other systems reviewed and negative.     Objective:  Physical Exam Vitals reviewed.  Constitutional:      Appearance: Normal appearance.  HENT:     Mouth/Throat:     Mouth: Mucous membranes are moist.     Pharynx: No oropharyngeal exudate.  Eyes:     Extraocular Movements: Extraocular movements intact.     Pupils: Pupils are equal, round, and reactive to light.  Cardiovascular:     Rate and Rhythm: Normal rate and regular rhythm.     Heart sounds: Murmur (4/6) heard.  Pulmonary:     Effort: Pulmonary effort is normal.     Breath sounds: Normal breath sounds.  Abdominal:     General: Bowel sounds are normal. There is no distension.     Palpations: Abdomen is soft.     Tenderness: There is no abdominal tenderness.  Musculoskeletal:        General: Swelling (tenderness of her leg, knee to ankle. mild swelling at ankle.) present. Normal range of motion.     Cervical back: Normal range of motion and neck supple.     Right lower leg: Edema present.     Left lower leg: No edema.  Skin:    General: Skin is warm and dry.  Neurological:     General: No focal deficit present.     Mental Status: She is alert.  Psychiatric:        Mood and Affect: Mood normal.           Assessment & Plan:

## 2021-08-19 ENCOUNTER — Other Ambulatory Visit (HOSPITAL_BASED_OUTPATIENT_CLINIC_OR_DEPARTMENT_OTHER): Payer: Self-pay | Admitting: Orthopaedic Surgery

## 2021-08-19 DIAGNOSIS — M25561 Pain in right knee: Secondary | ICD-10-CM

## 2021-08-20 ENCOUNTER — Ambulatory Visit (HOSPITAL_BASED_OUTPATIENT_CLINIC_OR_DEPARTMENT_OTHER): Payer: Medicare (Managed Care) | Admitting: Orthopaedic Surgery

## 2021-08-20 LAB — CBC
HCT: 38.7 % (ref 35.0–45.0)
Hemoglobin: 12.5 g/dL (ref 11.7–15.5)
MCH: 35.3 pg — ABNORMAL HIGH (ref 27.0–33.0)
MCHC: 32.3 g/dL (ref 32.0–36.0)
MCV: 109.3 fL — ABNORMAL HIGH (ref 80.0–100.0)
MPV: 9.9 fL (ref 7.5–12.5)
Platelets: 213 10*3/uL (ref 140–400)
RBC: 3.54 10*6/uL — ABNORMAL LOW (ref 3.80–5.10)
RDW: 13.4 % (ref 11.0–15.0)
WBC: 4.2 10*3/uL (ref 3.8–10.8)

## 2021-08-20 LAB — COMPREHENSIVE METABOLIC PANEL
AG Ratio: 1.2 (calc) (ref 1.0–2.5)
ALT: 9 U/L (ref 6–29)
AST: 16 U/L (ref 10–35)
Albumin: 4.2 g/dL (ref 3.6–5.1)
Alkaline phosphatase (APISO): 66 U/L (ref 37–153)
BUN/Creatinine Ratio: 28 (calc) — ABNORMAL HIGH (ref 6–22)
BUN: 26 mg/dL — ABNORMAL HIGH (ref 7–25)
CO2: 28 mmol/L (ref 20–32)
Calcium: 9.8 mg/dL (ref 8.6–10.4)
Chloride: 106 mmol/L (ref 98–110)
Creat: 0.92 mg/dL (ref 0.60–1.00)
Globulin: 3.4 g/dL (calc) (ref 1.9–3.7)
Glucose, Bld: 100 mg/dL — ABNORMAL HIGH (ref 65–99)
Potassium: 3.8 mmol/L (ref 3.5–5.3)
Sodium: 142 mmol/L (ref 135–146)
Total Bilirubin: 0.4 mg/dL (ref 0.2–1.2)
Total Protein: 7.6 g/dL (ref 6.1–8.1)

## 2021-08-20 LAB — HIV-1 RNA QUANT-NO REFLEX-BLD
HIV 1 RNA Quant: NOT DETECTED Copies/mL
HIV-1 RNA Quant, Log: NOT DETECTED Log cps/mL

## 2021-08-20 LAB — T-HELPER CELLS (CD4) COUNT (NOT AT ARMC)
Absolute CD4: 285 cells/uL — ABNORMAL LOW (ref 490–1740)
CD4 T Helper %: 31 % (ref 30–61)
Total lymphocyte count: 926 cells/uL (ref 850–3900)

## 2021-08-20 LAB — RPR: RPR Ser Ql: NONREACTIVE

## 2021-08-22 ENCOUNTER — Ambulatory Visit (INDEPENDENT_AMBULATORY_CARE_PROVIDER_SITE_OTHER): Payer: Medicare (Managed Care)

## 2021-08-22 DIAGNOSIS — I495 Sick sinus syndrome: Secondary | ICD-10-CM

## 2021-08-23 ENCOUNTER — Ambulatory Visit (INDEPENDENT_AMBULATORY_CARE_PROVIDER_SITE_OTHER): Payer: Medicare (Managed Care)

## 2021-08-23 ENCOUNTER — Ambulatory Visit (INDEPENDENT_AMBULATORY_CARE_PROVIDER_SITE_OTHER): Payer: Medicare (Managed Care) | Admitting: Orthopaedic Surgery

## 2021-08-23 ENCOUNTER — Encounter: Payer: Self-pay | Admitting: Orthopaedic Surgery

## 2021-08-23 ENCOUNTER — Other Ambulatory Visit: Payer: Self-pay

## 2021-08-23 DIAGNOSIS — G8929 Other chronic pain: Secondary | ICD-10-CM

## 2021-08-23 DIAGNOSIS — M1711 Unilateral primary osteoarthritis, right knee: Secondary | ICD-10-CM

## 2021-08-23 DIAGNOSIS — M5441 Lumbago with sciatica, right side: Secondary | ICD-10-CM

## 2021-08-23 DIAGNOSIS — M1611 Unilateral primary osteoarthritis, right hip: Secondary | ICD-10-CM

## 2021-08-23 MED ORDER — DICLOFENAC SODIUM 75 MG PO TBEC: 75.0000 mg | DELAYED_RELEASE_TABLET | Freq: Two times a day (BID) | ORAL | 2 refills | Status: AC | PRN

## 2021-08-23 MED ORDER — METHOCARBAMOL 500 MG PO TABS: 500.0000 mg | ORAL_TABLET | Freq: Two times a day (BID) | ORAL | 1 refills | Status: AC | PRN

## 2021-08-23 MED ORDER — PREDNISONE 10 MG (21) PO TBPK: ORAL_TABLET | ORAL | 0 refills | Status: DC

## 2021-08-23 NOTE — Progress Notes (Signed)
Office Visit Note   Patient: Nicole Mccormick           Date of Birth: [birth year]           MRN: [id number] Visit Date: 08/23/2021              Requested by: Nicole Riches, MD Nicole Mccormick,  [place] [address] PCP: Nicole Side., FNP   Assessment & Plan: Visit Diagnoses:  1. Primary osteoarthritis of right knee   2. Primary osteoarthritis of right hip   3. Chronic right-sided low back pain with right-sided sciatica     Plan: Impression is chronic right low back pain with radiculopathy of the right lower extremity.  I believe the patient is dealing not only with the chronic low back pain but also with right hip and right knee osteoarthritis, but that the majority of her symptoms are coming from the back.. we have discussed starting her on a steroid taper and muscle relaxers followed by nsaids.  These were called in.  Follow up if not any better over the next few weeks.   Follow-Up Instructions: Return if symptoms worsen or fail to improve.   Orders:  Orders Placed This Encounter  Procedures   XR KNEE 3 VIEW RIGHT   XR HIP UNILAT W OR W/O PELVIS 2-3 VIEWS RIGHT   Meds ordered this encounter  Medications   predniSONE (STERAPRED UNI-PAK 21 TAB) 10 MG (21) TBPK tablet    Sig: Take as directed    Dispense:  21 tablet    Refill:  0   methocarbamol (ROBAXIN) 500 MG tablet    Sig: Take 1 tablet (500 mg total) by mouth 2 (two) times daily as needed.    Dispense:  20 tablet    Refill:  1   diclofenac (VOLTAREN) 75 MG EC tablet    Sig: Take 1 tablet (75 mg total) by mouth 2 (two) times daily as needed. Do not take until finished with steroid taper    Dispense:  60 tablet    Refill:  2       Procedures: No procedures performed   Clinical Data: No additional findings.   Subjective: Chief Complaint  Patient presents with   Right Knee - Pain   Right Hip - Pain    HPI patient is a pleasant 75 year old female who comes in today with right lower back  pain.  The pain she has is to the right lower back and radiates into the buttock and down the entire leg and into the shin.  This is been ongoing for the past several weeks but has recently worsened.  She describes it as a constant throb with some mild weakness.  Any movement of her right leg will lower back seems to aggravate her symptoms most.  She also has pain at night.  She has been taking NSAIDs, Tylenol and gabapentin with minimal relief of symptoms.  She does note tingling to her right foot.  She has a history of chronic back pain but no previous epidural steroid injections or surgery.  She has not been to physical therapy.  Review of Systems as detailed in HPI.  All others reviewed and are negative.   Objective: Vital Signs: There were no vitals taken for this visit.  Physical Exam well-developed well-nourished female no acute distress.  Alert and oriented x3.  Ortho Exam examination of her lumbar spine reveals moderate paraspinous tenderness on the right.  Moderate tenderness to  the greater trochanter.  Markedly positive straight leg raise.  She does have tenderness to the entire knee.  Painless range of motion of the knee.  Positive logroll and positive straight leg raise.  No focal weakness.  She is neurovascular intact distally.  Specialty Comments:  No specialty comments available.  Imaging: XR HIP UNILAT W OR W/O PELVIS 2-3 VIEWS RIGHT  Result Date: 08/23/2021 Moderate degenerative changes to the right hip joint  XR KNEE 3 VIEW RIGHT  Result Date: 08/23/2021 Moderate medial compartment degenerative changes    PMFS History: Patient Active Problem List   Diagnosis Date Noted   Aortic stenosis 08/17/2021   Knee pain, right 08/17/2021   Iron deficiency anemia due to chronic blood loss 01/03/2021   Unspecified severe protein-calorie malnutrition ([place]) 11/14/2020   Acute blood loss anemia 10/30/2020   AVM (arteriovenous malformation) of stomach, acquired 08/26/2020   COPD  (chronic obstructive pulmonary disease) ([place]) 08/24/2020   Arthritis 08/23/2020   Dyspnea    Shortness of breath    Severe anemia 06/03/2020   Chronic arthralgias of knees and hips 07/06/2018   Sick sinus syndrome ([place]) 02/28/2016   Hypertensive cardiovascular disease 02/28/2016   Bilateral carotid bruits 02/12/2016   LVH (left ventricular hypertrophy) 01/15/2016   GI bleed 11/07/2015   Anxiety 07/22/2015   Seasonal allergies 07/22/2015   Asthma, chronic 07/22/2015   Health care maintenance 02/17/2015   Hepatitis C 08/07/2014   Back pain 10/28/2012   LGSIL Pap smear of vagina 01/16/2012   Anal condylomata 11/18/2011   CAD (coronary artery disease) 08/07/2011   Essential hypertension, benign 12/25/2010   Human immunodeficiency virus (HIV) disease ([place]) 01/23/2007   TOBACCO ABUSE 01/23/2007   CATARACT NEC 01/23/2007   Gastroesophageal reflux disease 01/23/2007   Past Medical History:  Diagnosis Date   Abnormal Pap smear    Aortic stenosis    Arthritis    Asthma    AVM (arteriovenous malformation)    Bradycardia    Complication of anesthesia    "they have a hard time bringing me back" (11/08/2015)   Coronary artery disease    a. diffuse CAD of RCA by cath 2016 managed medically.   Dynamic left ventricular outflow obstruction    GERD (gastroesophageal reflux disease)    previously on aciphex, discontinued november 2012  because patient asymptomatic, and concern about interference with HIV meds.  May try pepcid in the future if symptoms return   Heart murmur    Hepatitis C antibody test positive    HIV infection ([place])    diagnosed before 2008   Hypertension    Hypertensive heart disease    Iron deficiency anemia    Ferritin = 2 in november 2012, started on iron supplemenation   Pacemaker    a. St Jude PPM 01/2018.   Seizures ([place])    Symptomatic anemia 08/24/2020   Varicosities     Family History  Problem Relation Age of Onset   Diabetes Mother    Ovarian cancer  Sister    Colon cancer Sister     Past Surgical History:  Procedure Laterality Date   ABDOMINAL HYSTERECTOMY     CARDIAC CATHETERIZATION N/A 11/08/2015   Procedure: Left Heart Cath and Coronary Angiography;  Surgeon: Jettie Booze, MD;  Location: [place] [facility];  Service: Cardiovascular;  Laterality: N/A;   COLONOSCOPY  10/13/11   small adenoma, anal condyloma   COLONOSCOPY  10/13/2011   Procedure: COLONOSCOPY;  Surgeon: Gatha Mayer, MD;  Location:  [place] [facility];  Service: Endoscopy;  Laterality: N/A;   COLONOSCOPY N/A 09/14/2014   Procedure: COLONOSCOPY;  Surgeon: Arta Silence, MD;  Location: [facility] ENDOSCOPY;  Service: Endoscopy;  Laterality: N/A;   COLONOSCOPY WITH PROPOFOL N/A 06/05/2020   Procedure: COLONOSCOPY WITH PROPOFOL;  Surgeon: Arta Silence, MD;  Location: [place];  Service: Endoscopy;  Laterality: N/A;   ENTEROSCOPY N/A 10/31/2020   Procedure: ENTEROSCOPY;  Surgeon: Otis Brace, MD;  Location: [facility] ENDOSCOPY;  Service: Gastroenterology;  Laterality: N/A;   ESOPHAGOGASTRODUODENOSCOPY  10/13/11   small hiatal hernia   ESOPHAGOGASTRODUODENOSCOPY  10/13/2011   Procedure: ESOPHAGOGASTRODUODENOSCOPY (EGD);  Surgeon: Gatha Mayer, MD;  Location: [facility] ENDOSCOPY;  Service: Endoscopy;  Laterality: N/A;   ESOPHAGOGASTRODUODENOSCOPY N/A 09/14/2014   Procedure: ESOPHAGOGASTRODUODENOSCOPY (EGD);  Surgeon: Arta Silence, MD;  Location: [facility] ENDOSCOPY;  Service: Endoscopy;  Laterality: N/A;   ESOPHAGOGASTRODUODENOSCOPY (EGD) WITH PROPOFOL N/A 06/05/2020   Procedure: ESOPHAGOGASTRODUODENOSCOPY (EGD) WITH PROPOFOL;  Surgeon: Arta Silence, MD;  Location: [place];  Service: Endoscopy;  Laterality: N/A;   GIVENS CAPSULE STUDY N/A 09/14/2014   Procedure: GIVENS CAPSULE STUDY;  Surgeon: Arta Silence, MD;  Location: [facility] ENDOSCOPY;  Service: Endoscopy;  Laterality: N/A;   GIVENS CAPSULE STUDY N/A 08/26/2020   Procedure: GIVENS CAPSULE STUDY;  Surgeon: Wonda Horner, MD;   Location: [facility] ENDOSCOPY;  Service: Endoscopy;  Laterality: N/A;   HOT HEMOSTASIS N/A 10/31/2020   Procedure: HOT HEMOSTASIS (ARGON PLASMA COAGULATION/BICAP);  Surgeon: Otis Brace, MD;  Location: [facility] ENDOSCOPY;  Service: Gastroenterology;  Laterality: N/A;   PACEMAKER IMPLANT N/A 02/05/2018   Procedure: PACEMAKER IMPLANT;  Surgeon: Thompson Grayer, MD;  Location: [place] [facility];  Service: Cardiovascular;  Laterality: N/A;   POLYPECTOMY  06/05/2020   Procedure: POLYPECTOMY;  Surgeon: Arta Silence, MD;  Location: [facility] ENDOSCOPY;  Service: Endoscopy;;   Social History   Occupational History   Occupation: retired  Tobacco Use   Smoking status: Every Day    Packs/day: 1.00    Years: 60.00    Pack years: 60.00    Types: Cigarettes   Smokeless tobacco: Never   Tobacco comments:    Wants to quit. Using both the patch and gum.  Vaping Use   Vaping Use: Never used  Substance and Sexual Activity   Alcohol use: Yes    Alcohol/week: 10.0 standard drinks    Types: 10 Glasses of wine per week    Comment: drinking less recently, denies h/o heavy use   Drug use: No   Sexual activity: Yes    Partners: Male    Birth control/protection: Surgical    Comment: pt. given condoms 08/23/20

## 2021-08-30 NOTE — Progress Notes (Signed)
Remote pacemaker transmission.   

## 2021-09-08 ENCOUNTER — Encounter: Payer: Self-pay | Admitting: Hematology and Oncology

## 2021-09-10 ENCOUNTER — Encounter: Payer: Self-pay | Admitting: Hematology and Oncology

## 2021-09-12 ENCOUNTER — Encounter: Payer: Self-pay | Admitting: Internal Medicine

## 2021-09-12 DIAGNOSIS — E785 Hyperlipidemia, unspecified: Secondary | ICD-10-CM

## 2021-09-12 DIAGNOSIS — Z950 Presence of cardiac pacemaker: Secondary | ICD-10-CM | POA: Insufficient documentation

## 2021-09-12 HISTORY — DX: Hyperlipidemia, unspecified: E78.5

## 2021-09-12 NOTE — Progress Notes (Deleted)
Cardiology Office Note:    Date:  09/12/2021   ID:  Nicole Mccormick, DOB January 01, 1946, MRN [id number]  PCP:  Sonia Side., [place] [facility] Cardiologist: Lenna Sciara, MD  Electrophysiologist:  Thompson Grayer, MD { Click to update primary MD,subspecialty MD or APP then REFRESH:1}    Referring MD: Campbell Riches, MD   No chief complaint on file. ***  History of Present Illness:    PROBLEM LIST: 1.  Coronary artery disease on cardiac catheterization 2016 which demonstrated 80% proximal right coronary artery lesion, 50% left circumflex lesion, and 95% PDA lesion; medical management was pursued as this was diagnosed during an acute GI bleed 2.  GI bleed in 2016 secondary to AVMs 3.  Hypertension 4.  Hyperlipidemia 5.  Hepatitis C 6.  HIV 7.  COPD 8.  Sick sinus syndrome status post permanent pacemaker followed by EP 9.  Mild to moderate aortic stenosis with a mean gradient 19 mmHg with ejection fraction of 75% on echocardiogram January 2021 10.  Ongoing tobacco abuse   Nicole Mccormick is a 75 y.o. female with a history as detailed above was referred by Dr. Johnnye Sima to reestablish cardiovascular care.  She had previously been seen the electrophysiology division for symptomatic sinus bradycardia.  She has had a relatively complicated medical history over the past few years including acute GI bleed requiring endoscopy and the diagnosis of obstructive coronary artery disease which was treated medically.    Past Surgical History:  Procedure Laterality Date   ABDOMINAL HYSTERECTOMY     CARDIAC CATHETERIZATION N/A 11/08/2015   Procedure: Left Heart Cath and Coronary Angiography;  Surgeon: Jettie Booze, MD;  Location: [place] [facility];  Service: Cardiovascular;  Laterality: N/A;   COLONOSCOPY  10/13/11   small adenoma, anal condyloma   COLONOSCOPY  10/13/2011   Procedure: COLONOSCOPY;  Surgeon: Gatha Mayer, MD;  Location: [place];  Service: Endoscopy;   Laterality: N/A;   COLONOSCOPY N/A 09/14/2014   Procedure: COLONOSCOPY;  Surgeon: Arta Silence, MD;  Location: [facility] ENDOSCOPY;  Service: Endoscopy;  Laterality: N/A;   COLONOSCOPY WITH PROPOFOL N/A 06/05/2020   Procedure: COLONOSCOPY WITH PROPOFOL;  Surgeon: Arta Silence, MD;  Location: [place];  Service: Endoscopy;  Laterality: N/A;   ENTEROSCOPY N/A 10/31/2020   Procedure: ENTEROSCOPY;  Surgeon: Otis Brace, MD;  Location: [facility] ENDOSCOPY;  Service: Gastroenterology;  Laterality: N/A;   ESOPHAGOGASTRODUODENOSCOPY  10/13/11   small hiatal hernia   ESOPHAGOGASTRODUODENOSCOPY  10/13/2011   Procedure: ESOPHAGOGASTRODUODENOSCOPY (EGD);  Surgeon: Gatha Mayer, MD;  Location: [facility] ENDOSCOPY;  Service: Endoscopy;  Laterality: N/A;   ESOPHAGOGASTRODUODENOSCOPY N/A 09/14/2014   Procedure: ESOPHAGOGASTRODUODENOSCOPY (EGD);  Surgeon: Arta Silence, MD;  Location: [facility] ENDOSCOPY;  Service: Endoscopy;  Laterality: N/A;   ESOPHAGOGASTRODUODENOSCOPY (EGD) WITH PROPOFOL N/A 06/05/2020   Procedure: ESOPHAGOGASTRODUODENOSCOPY (EGD) WITH PROPOFOL;  Surgeon: Arta Silence, MD;  Location: [place];  Service: Endoscopy;  Laterality: N/A;   GIVENS CAPSULE STUDY N/A 09/14/2014   Procedure: GIVENS CAPSULE STUDY;  Surgeon: Arta Silence, MD;  Location: [facility] ENDOSCOPY;  Service: Endoscopy;  Laterality: N/A;   GIVENS CAPSULE STUDY N/A 08/26/2020   Procedure: GIVENS CAPSULE STUDY;  Surgeon: Wonda Horner, MD;  Location: [facility] ENDOSCOPY;  Service: Endoscopy;  Laterality: N/A;   HOT HEMOSTASIS N/A 10/31/2020   Procedure: HOT HEMOSTASIS (ARGON PLASMA COAGULATION/BICAP);  Surgeon: Otis Brace, MD;  Location: [facility] ENDOSCOPY;  Service: Gastroenterology;  Laterality: N/A;   PACEMAKER IMPLANT N/A 02/05/2018   Procedure: PACEMAKER  IMPLANT;  Surgeon: Thompson Grayer, MD;  Location: [place] [facility];  Service: Cardiovascular;  Laterality: N/A;   POLYPECTOMY  06/05/2020   Procedure: POLYPECTOMY;  Surgeon: Arta Silence,  MD;  Location: [facility] ENDOSCOPY;  Service: Endoscopy;;    Current Medications: No outpatient medications have been marked as taking for the 09/13/21 encounter (Appointment) with Early Osmond, MD.     Allergies:   Penicillins, Penicillin g, and Avelox [moxifloxacin hcl in nacl]   Social History   Socioeconomic History   Marital status: Married    Spouse name: Not on file   Number of children: 6   Years of education: Not on file   Highest education level: Not on file  Occupational History   Occupation: retired  Tobacco Use   Smoking status: Every Day    Packs/day: 1.00    Years: 60.00    Pack years: 60.00    Types: Cigarettes   Smokeless tobacco: Never   Tobacco comments:    Wants to quit. Using both the patch and gum.  Vaping Use   Vaping Use: Never used  Substance and Sexual Activity   Alcohol use: Yes    Alcohol/week: 10.0 standard drinks    Types: 10 Glasses of wine per week    Comment: drinking less recently, denies h/o heavy use   Drug use: No   Sexual activity: Yes    Partners: Male    Birth control/protection: Surgical    Comment: pt. given condoms 08/23/20  Other Topics Concern   Not on file  Social History Narrative   Not on file   Social Determinants of Health   Financial Resource Strain: Not on file  Food Insecurity: Not on file  Transportation Needs: Not on file  Physical Activity: Not on file  Stress: Not on file  Social Connections: Not on file     Family History: The patient's ***family history includes Colon cancer in her sister; Diabetes in her mother; Ovarian cancer in her sister.  ROS:   Please see the history of present illness.    *** All other systems reviewed and are negative.  EKGs/Labs/Other Studies Reviewed:    The following studies were reviewed today: ***  EKG:  EKG is *** ordered today.  The ekg ordered today demonstrates ***  Recent Labs: 08/17/2021: ALT 9; BUN 26; Creat 0.92; Hemoglobin 12.5; Platelets 213; Potassium 3.8;  Sodium 142  Recent Lipid Panel    Component Value Date/Time   CHOL 111 11/10/2017 1417   TRIG 89 11/10/2017 1417   HDL 59 11/10/2017 1417   CHOLHDL 1.9 11/10/2017 1417   VLDL 13 05/15/2017 1646   LDLCALC 35 11/10/2017 1417     Risk Assessment/Calculations:   {Does this patient have ATRIAL FIBRILLATION?:[id number]}       Physical Exam:    VS:  There were no vitals taken for this visit.    Wt Readings from Last 3 Encounters:  08/17/21 128 lb (58.1 kg)  08/01/21 128 lb 4.8 oz (58.2 kg)  04/25/21 128 lb 11.2 oz (58.4 kg)     GEN: *** Well nourished, well developed in no acute distress HEENT: Normal NECK: No JVD; No carotid bruits LYMPHATICS: No lymphadenopathy CARDIAC: ***RRR, no murmurs, rubs, gallops RESPIRATORY:  Clear to auscultation without rales, wheezing or rhonchi  ABDOMEN: Soft, non-tender, non-distended MUSCULOSKELETAL:  No edema; No deformity  SKIN: Warm and dry NEUROLOGIC:  Alert and oriented x 3 PSYCHIATRIC:  Normal affect   ASSESSMENT:    1. Essential  hypertension, benign   2. Nonrheumatic aortic valve stenosis   3. Coronary artery disease involving native coronary artery of native heart without angina pectoris   4. Hyperlipidemia, unspecified hyperlipidemia type   5. Pacemaker   6. Chronic hepatitis C without hepatic coma (HCC)   7. Human immunodeficiency virus (HIV) disease ([place])   8. TOBACCO ABUSE    PLAN:    In order of problems listed above:  1. Essential hypertension, benign     2. Nonrheumatic aortic valve stenosis     3. Coronary artery disease involving native coronary artery of native heart without angina pectoris   She is on a beta-blocker and a statin.  I will restart aspirin 81 mg for secondary prevention and she is on a PPI.  I will prescribe as needed nitroglycerin.  4. Hyperlipidemia, unspecified hyperlipidemia type   Continue Lipitor, will check lipid panel.  Her LFTs and recently were within normal limits.  5. Pacemaker    This is followed by the EP division.  6. Chronic hepatitis C without hepatic coma (HCC)   Continue to monitor, her LFTs are within normal limits.  7. Human immunodeficiency virus (HIV) disease ([place])   Continue to current therapy this is being managed by her primary care provider.  8. TOBACCO ABUSE        {Are you ordering a CV Procedure (e.g. stress test, cath, DCCV, TEE, etc)?   Press F2        :[id number]}    Medication Adjustments/Labs and Tests Ordered: Current medicines are reviewed at length with the patient today.  Concerns regarding medicines are outlined above.  No orders of the defined types were placed in this encounter.  No orders of the defined types were placed in this encounter.   There are no Patient Instructions on file for this visit.   Signed, Early Osmond, MD  09/12/2021 3:11 PM    [place]

## 2021-09-13 ENCOUNTER — Encounter: Payer: Self-pay | Admitting: Hematology and Oncology

## 2021-09-13 ENCOUNTER — Ambulatory Visit: Payer: Medicare (Managed Care) | Admitting: Internal Medicine

## 2021-09-13 DIAGNOSIS — I10 Essential (primary) hypertension: Secondary | ICD-10-CM

## 2021-09-13 DIAGNOSIS — F17200 Nicotine dependence, unspecified, uncomplicated: Secondary | ICD-10-CM

## 2021-09-13 DIAGNOSIS — Z950 Presence of cardiac pacemaker: Secondary | ICD-10-CM

## 2021-09-13 DIAGNOSIS — I350 Nonrheumatic aortic (valve) stenosis: Secondary | ICD-10-CM

## 2021-09-13 DIAGNOSIS — B182 Chronic viral hepatitis C: Secondary | ICD-10-CM

## 2021-09-13 DIAGNOSIS — I2510 Atherosclerotic heart disease of native coronary artery without angina pectoris: Secondary | ICD-10-CM

## 2021-09-13 DIAGNOSIS — E785 Hyperlipidemia, unspecified: Secondary | ICD-10-CM

## 2021-09-13 DIAGNOSIS — B20 Human immunodeficiency virus [HIV] disease: Secondary | ICD-10-CM

## 2021-09-14 ENCOUNTER — Encounter: Payer: Self-pay | Admitting: Hematology and Oncology

## 2021-09-18 ENCOUNTER — Encounter: Payer: Self-pay | Admitting: Hematology and Oncology

## 2021-09-19 ENCOUNTER — Ambulatory Visit: Payer: 59 | Admitting: Internal Medicine

## 2021-09-19 NOTE — Progress Notes (Deleted)
Cardiology Office Note:    Date:  09/19/2021   ID:  [name], DOB 06/22/1946, MRN [id number]  PCP:  Early Osmond, MD   [facility] Providers Cardiologist:  None Electrophysiologist:  Thompson Grayer, MD { Click to update primary MD,subspecialty MD or APP then REFRESH:1}    Referring MD: Campbell Riches, MD   No chief complaint on file. ***  History of Present Illness:    PROBLEM LIST: 1.  Coronary artery disease with serial right coronary artery lesions and right PDA lesion on cardiac catheterization 2016; treated conservatively due to GI bleed 2.  Symptomatic bradycardia status post permanent pacemaker 3.  Mild to moderate aortic stenosis on echocardiogram 2021 4.  HIV 5.  Hepatitis C 6.  Hypertension 7.  GERD  Nicole Mccormick is a 75 y.o. female with the above listed medical problems here to reestablish cardiovascular care.  The patient referred by Dr. Johnnye Sima.    Past Surgical History:  Procedure Laterality Date   ABDOMINAL HYSTERECTOMY     CARDIAC CATHETERIZATION N/A 11/08/2015   Procedure: Left Heart Cath and Coronary Angiography;  Surgeon: Jettie Booze, MD;  Location: [place] [facility];  Service: Cardiovascular;  Laterality: N/A;   COLONOSCOPY  10/13/11   small adenoma, anal condyloma   COLONOSCOPY  10/13/2011   Procedure: COLONOSCOPY;  Surgeon: Gatha Mayer, MD;  Location: [place];  Service: Endoscopy;  Laterality: N/A;   COLONOSCOPY N/A 09/14/2014   Procedure: COLONOSCOPY;  Surgeon: Arta Silence, MD;  Location: [facility] ENDOSCOPY;  Service: Endoscopy;  Laterality: N/A;   COLONOSCOPY WITH PROPOFOL N/A 06/05/2020   Procedure: COLONOSCOPY WITH PROPOFOL;  Surgeon: Arta Silence, MD;  Location: [place];  Service: Endoscopy;  Laterality: N/A;   ENTEROSCOPY N/A 10/31/2020   Procedure: ENTEROSCOPY;  Surgeon: Otis Brace, MD;  Location: [facility] ENDOSCOPY;  Service: Gastroenterology;  Laterality: N/A;   ESOPHAGOGASTRODUODENOSCOPY  10/13/11   small  hiatal hernia   ESOPHAGOGASTRODUODENOSCOPY  10/13/2011   Procedure: ESOPHAGOGASTRODUODENOSCOPY (EGD);  Surgeon: Gatha Mayer, MD;  Location: [facility] ENDOSCOPY;  Service: Endoscopy;  Laterality: N/A;   ESOPHAGOGASTRODUODENOSCOPY N/A 09/14/2014   Procedure: ESOPHAGOGASTRODUODENOSCOPY (EGD);  Surgeon: Arta Silence, MD;  Location: [facility] ENDOSCOPY;  Service: Endoscopy;  Laterality: N/A;   ESOPHAGOGASTRODUODENOSCOPY (EGD) WITH PROPOFOL N/A 06/05/2020   Procedure: ESOPHAGOGASTRODUODENOSCOPY (EGD) WITH PROPOFOL;  Surgeon: Arta Silence, MD;  Location: [place];  Service: Endoscopy;  Laterality: N/A;   GIVENS CAPSULE STUDY N/A 09/14/2014   Procedure: GIVENS CAPSULE STUDY;  Surgeon: Arta Silence, MD;  Location: [facility] ENDOSCOPY;  Service: Endoscopy;  Laterality: N/A;   GIVENS CAPSULE STUDY N/A 08/26/2020   Procedure: GIVENS CAPSULE STUDY;  Surgeon: Wonda Horner, MD;  Location: [facility] ENDOSCOPY;  Service: Endoscopy;  Laterality: N/A;   HOT HEMOSTASIS N/A 10/31/2020   Procedure: HOT HEMOSTASIS (ARGON PLASMA COAGULATION/BICAP);  Surgeon: Otis Brace, MD;  Location: [facility] ENDOSCOPY;  Service: Gastroenterology;  Laterality: N/A;   PACEMAKER IMPLANT N/A 02/05/2018   Procedure: PACEMAKER IMPLANT;  Surgeon: Thompson Grayer, MD;  Location: [place] [facility];  Service: Cardiovascular;  Laterality: N/A;   POLYPECTOMY  06/05/2020   Procedure: POLYPECTOMY;  Surgeon: Arta Silence, MD;  Location: [facility] ENDOSCOPY;  Service: Endoscopy;;    Current Medications: No outpatient medications have been marked as taking for the 09/19/21 encounter (Appointment) with Early Osmond, MD.     Allergies:   Penicillins, Penicillin g, and Avelox [moxifloxacin hcl in nacl]   Social History   Socioeconomic History   Marital status: Married  Spouse name: Not on file   Number of children: 6   Years of education: Not on file   Highest education level: Not on file  Occupational History   Occupation: retired  Tobacco Use   Smoking  status: Every Day    Packs/day: 1.00    Years: 60.00    Pack years: 60.00    Types: Cigarettes   Smokeless tobacco: Never   Tobacco comments:    Wants to quit. Using both the patch and gum.  Vaping Use   Vaping Use: Never used  Substance and Sexual Activity   Alcohol use: Yes    Alcohol/week: 10.0 standard drinks    Types: 10 Glasses of wine per week    Comment: drinking less recently, denies h/o heavy use   Drug use: No   Sexual activity: Yes    Partners: Male    Birth control/protection: Surgical    Comment: pt. given condoms 08/23/20  Other Topics Concern   Not on file  Social History Narrative   Not on file   Social Determinants of Health   Financial Resource Strain: Not on file  Food Insecurity: Not on file  Transportation Needs: Not on file  Physical Activity: Not on file  Stress: Not on file  Social Connections: Not on file     Family History: The patient's ***family history includes Colon cancer in her sister; Diabetes in her mother; Ovarian cancer in her sister.  ROS:   Please see the history of present illness.    *** All other systems reviewed and are negative.  EKGs/Labs/Other Studies Reviewed:    The following studies were reviewed today: ***  EKG:  EKG is *** ordered today.  The ekg ordered today demonstrates ***  Recent Labs: 08/17/2021: ALT 9; BUN 26; Creat 0.92; Hemoglobin 12.5; Platelets 213; Potassium 3.8; Sodium 142  Recent Lipid Panel    Component Value Date/Time   CHOL 111 11/10/2017 1417   TRIG 89 11/10/2017 1417   HDL 59 11/10/2017 1417   CHOLHDL 1.9 11/10/2017 1417   VLDL 13 05/15/2017 1646   LDLCALC 35 11/10/2017 1417     Risk Assessment/Calculations:   {Does this patient have ATRIAL FIBRILLATION?:[phone]}       Physical Exam:    VS:  There were no vitals taken for this visit.    Wt Readings from Last 3 Encounters:  08/17/21 128 lb (58.1 kg)  08/01/21 128 lb 4.8 oz (58.2 kg)  04/25/21 128 lb 11.2 oz (58.4 kg)      GEN: *** Well nourished, well developed in no acute distress HEENT: Normal NECK: No JVD; No carotid bruits LYMPHATICS: No lymphadenopathy CARDIAC: ***RRR, no murmurs, rubs, gallops RESPIRATORY:  Clear to auscultation without rales, wheezing or rhonchi  ABDOMEN: Soft, non-tender, non-distended MUSCULOSKELETAL:  No edema; No deformity  SKIN: Warm and dry NEUROLOGIC:  Alert and oriented x 3 PSYCHIATRIC:  Normal affect   ASSESSMENT/PLAN   1. Nonrheumatic aortic valve stenosis   2. Essential hypertension, benign   3. Sick sinus syndrome ([place])   4. Coronary artery disease involving native coronary artery of native heart without angina pectoris   5. Type 1 diabetes mellitus without complication (HCC)       {Are you ordering a CV Procedure (e.g. stress test, cath, DCCV, TEE, etc)?   Press F2        :[id number]}    Medication Adjustments/Labs and Tests Ordered: Current medicines are reviewed at length with the patient today.  Concerns regarding medicines  are outlined above.  No orders of the defined types were placed in this encounter.  No orders of the defined types were placed in this encounter.   There are no Patient Instructions on file for this visit.   Signed, Early Osmond, MD  09/19/2021 8:44 AM    [place]

## 2021-09-27 NOTE — Progress Notes (Deleted)
Cardiology Office Note:    Date:  09/27/2021   ID:  Nicole Mccormick, DOB 1946/02/27, MRN [id number]  PCP:  Sonia Side., FNP   [facility] Providers Cardiologist:  None Electrophysiologist:  Thompson Grayer, MD { Click to update primary MD,subspecialty MD or APP then REFRESH:1}    Referring MD: Campbell Riches, MD   CC:  Establish cardiovascular care  History of Present Illness:    PROBLEM LIST:  1.  Coronary artery disease on cardiac catheterization 2016 which demonstrated 80% proximal right coronary artery lesion, 50% left circumflex lesion, and 95% PDA lesion; medical management was pursued as this was diagnosed during an acute GI bleed  2.  GI bleed in 2016 secondary to AVMs  3.  Hypertension  4.  Hyperlipidemia  5.  Hepatitis C  6.  HIV  7.  COPD  8.  Sick sinus syndrome status post permanent pacemaker followed by EP  9.  Mild to moderate aortic stenosis with a mean gradient 19 mmHg with ejection fraction of 75% on echocardiogram January 2021  10.  Ongoing tobacco abuse    Nicole Mccormick is a 75 y.o. female with a history as detailed above was referred by Dr. Johnnye Sima to reestablish cardiovascular care.  She had previously been seen the electrophysiology division for symptomatic sinus bradycardia.  She has had a relatively complicated medical history over the past few years including acute GI bleed requiring endoscopy and the diagnosis of obstructive coronary artery disease which was treated medically.    Past Surgical History:  Procedure Laterality Date   ABDOMINAL HYSTERECTOMY     CARDIAC CATHETERIZATION N/A 11/08/2015   Procedure: Left Heart Cath and Coronary Angiography;  Surgeon: Jettie Booze, MD;  Location: [place] [facility];  Service: Cardiovascular;  Laterality: N/A;   COLONOSCOPY  10/13/11   small adenoma, anal condyloma   COLONOSCOPY  10/13/2011   Procedure: COLONOSCOPY;  Surgeon: Gatha Mayer, MD;  Location: [place];  Service: Endoscopy;   Laterality: N/A;   COLONOSCOPY N/A 09/14/2014   Procedure: COLONOSCOPY;  Surgeon: Arta Silence, MD;  Location: [facility] ENDOSCOPY;  Service: Endoscopy;  Laterality: N/A;   COLONOSCOPY WITH PROPOFOL N/A 06/05/2020   Procedure: COLONOSCOPY WITH PROPOFOL;  Surgeon: Arta Silence, MD;  Location: [place];  Service: Endoscopy;  Laterality: N/A;   ENTEROSCOPY N/A 10/31/2020   Procedure: ENTEROSCOPY;  Surgeon: Otis Brace, MD;  Location: [facility] ENDOSCOPY;  Service: Gastroenterology;  Laterality: N/A;   ESOPHAGOGASTRODUODENOSCOPY  10/13/11   small hiatal hernia   ESOPHAGOGASTRODUODENOSCOPY  10/13/2011   Procedure: ESOPHAGOGASTRODUODENOSCOPY (EGD);  Surgeon: Gatha Mayer, MD;  Location: [facility] ENDOSCOPY;  Service: Endoscopy;  Laterality: N/A;   ESOPHAGOGASTRODUODENOSCOPY N/A 09/14/2014   Procedure: ESOPHAGOGASTRODUODENOSCOPY (EGD);  Surgeon: Arta Silence, MD;  Location: [facility]COPY;  Service: Endoscopy;  Laterality: N/A;   ESOPHAGOGASTRODUODENOSCOPY (EGD) WITH PROPOFOL N/A 06/05/2020   Procedure: ESOPHAGOGASTRODUODENOSCOPY (EGD) WITH PROPOFOL;  Surgeon: Arta Silence, MD;  Location: [place];  Service: Endoscopy;  Laterality: N/A;   GIVENS CAPSULE STUDY N/A 09/14/2014   Procedure: GIVENS CAPSULE STUDY;  Surgeon: Arta Silence, MD;  Location: [facility] ENDOSCOPY;  Service: Endoscopy;  Laterality: N/A;   GIVENS CAPSULE STUDY N/A 08/26/2020   Procedure: GIVENS CAPSULE STUDY;  Surgeon: Wonda Horner, MD;  Location: [facility] ENDOSCOPY;  Service: Endoscopy;  Laterality: N/A;   HOT HEMOSTASIS N/A 10/31/2020   Procedure: HOT HEMOSTASIS (ARGON PLASMA COAGULATION/BICAP);  Surgeon: Otis Brace, MD;  Location: [facility] ENDOSCOPY;  Service: Gastroenterology;  Laterality: N/A;  PACEMAKER IMPLANT N/A 02/05/2018   Procedure: PACEMAKER IMPLANT;  Surgeon: Thompson Grayer, MD;  Location: [place] [facility];  Service: Cardiovascular;  Laterality: N/A;   POLYPECTOMY  06/05/2020   Procedure: POLYPECTOMY;  Surgeon: Arta Silence,  MD;  Location: [facility] ENDOSCOPY;  Service: Endoscopy;;    Current Medications: No outpatient medications have been marked as taking for the 09/28/21 encounter (Appointment) with Early Osmond, MD.     Allergies:   Penicillins, Penicillin g, and Avelox [moxifloxacin hcl in nacl]   Social History   Socioeconomic History   Marital status: Married    Spouse name: Not on file   Number of children: 6   Years of education: Not on file   Highest education level: Not on file  Occupational History   Occupation: retired  Tobacco Use   Smoking status: Every Day    Packs/day: 1.00    Years: 60.00    Pack years: 60.00    Types: Cigarettes   Smokeless tobacco: Never   Tobacco comments:    Wants to quit. Using both the patch and gum.  Vaping Use   Vaping Use: Never used  Substance and Sexual Activity   Alcohol use: Yes    Alcohol/week: 10.0 standard drinks    Types: 10 Glasses of wine per week    Comment: drinking less recently, denies h/o heavy use   Drug use: No   Sexual activity: Yes    Partners: Male    Birth control/protection: Surgical    Comment: pt. given condoms 08/23/20  Other Topics Concern   Not on file  Social History Narrative   Not on file   Social Determinants of Health   Financial Resource Strain: Not on file  Food Insecurity: Not on file  Transportation Needs: Not on file  Physical Activity: Not on file  Stress: Not on file  Social Connections: Not on file     Family History: The patient's family history includes Colon cancer in her sister; Diabetes in her mother; Ovarian cancer in her sister.  ROS:   Please see the history of present illness.    All other systems reviewed and are negative.  EKGs/Labs/Other Studies Reviewed:    The following studies were reviewed today: Cardiac catheterization and echocardiogram details reported above  EKG:   The ekg ordered today demonstrates November 2021 demonstrates atrial pacing  Recent Labs: 08/17/2021: ALT 9;  BUN 26; Creat 0.92; Hemoglobin 12.5; Platelets 213; Potassium 3.8; Sodium 142   Recent Lipid Panel    Component Value Date/Time   CHOL 111 11/10/2017 1417   TRIG 89 11/10/2017 1417   HDL 59 11/10/2017 1417   CHOLHDL 1.9 11/10/2017 1417   VLDL 13 05/15/2017 1646   LDLCALC 35 11/10/2017 1417     Risk Assessment/Calculations:           Physical Exam:    VS:  There were no vitals taken for this visit.    Wt Readings from Last 3 Encounters:  08/17/21 128 lb (58.1 kg)  08/01/21 128 lb 4.8 oz (58.2 kg)  04/25/21 128 lb 11.2 oz (58.4 kg)     GEN: *** Well nourished, well developed in no acute distress HEENT: Normal NECK: No JVD; No carotid bruits LYMPHATICS: No lymphadenopathy CARDIAC: ***RRR, no murmurs, rubs, gallops RESPIRATORY:  Clear to auscultation without rales, wheezing or rhonchi  ABDOMEN: Soft, non-tender, non-distended MUSCULOSKELETAL:  No edema; No deformity  SKIN: Warm and dry NEUROLOGIC:  Alert and oriented x 3 PSYCHIATRIC:  Normal  affect   ASSESSMENT and PLAN   No diagnosis found.  1. Essential hypertension, benign    2. Nonrheumatic aortic valve stenosis    3. Coronary artery disease involving native coronary artery of native heart without angina pectoris   She is on a beta-blocker and a statin.  I will restart aspirin 81 mg for secondary prevention and she is on a PPI.  I will prescribe as needed nitroglycerin.  4. Hyperlipidemia, unspecified hyperlipidemia type   Continue Lipitor, will check lipid panel.  Her LFTs and recently were within normal limits.  5. Pacemaker   This is followed by the EP division.  6. Chronic hepatitis C without hepatic coma (HCC)   Continue to monitor, her LFTs are within normal limits.  7. Human immunodeficiency virus (HIV) disease ([place])   Continue to current therapy this is being managed by her primary care provider.  8. TOBACCO ABUSE   {Are you ordering a CV Procedure (e.g. stress test, cath, DCCV, TEE, etc)?    Press F2        :[id number]}    Medication Adjustments/Labs and Tests Ordered: Current medicines are reviewed at length with the patient today.  Concerns regarding medicines are outlined above.  No orders of the defined types were placed in this encounter.  No orders of the defined types were placed in this encounter.   There are no Patient Instructions on file for this visit.   Signed, Early Osmond, MD  09/27/2021 8:44 AM     [facility]

## 2021-09-28 ENCOUNTER — Ambulatory Visit: Payer: 59 | Admitting: Internal Medicine

## 2021-09-28 DIAGNOSIS — I2510 Atherosclerotic heart disease of native coronary artery without angina pectoris: Secondary | ICD-10-CM

## 2021-09-28 DIAGNOSIS — I10 Essential (primary) hypertension: Secondary | ICD-10-CM

## 2021-09-28 DIAGNOSIS — E785 Hyperlipidemia, unspecified: Secondary | ICD-10-CM

## 2021-09-28 DIAGNOSIS — I495 Sick sinus syndrome: Secondary | ICD-10-CM

## 2021-10-03 NOTE — Assessment & Plan Note (Deleted)
Will restart ASA as she is on a PPI and her GIB was in 2016.

## 2021-10-03 NOTE — Progress Notes (Deleted)
Cardiology Office Note:    Date:  10/03/2021   ID:  Nicole Mccormick, DOB [birth year], MRN [id number]  PCP:  Sonia Side., FNP   [facility] Providers Cardiologist:  None Electrophysiologist:  Thompson Grayer, MD { Click to update primary MD,subspecialty MD or APP then REFRESH:1}    Referring MD: Campbell Riches, MD   Chief Complaint:  Establish cardiovascular care  History of Present Illness:    PROBLEM LIST: 1.  Coronary artery disease with serial right coronary artery lesions and right PDA lesion on cardiac catheterization 2016; treated conservatively due to GI bleed 2.  Symptomatic bradycardia status post permanent pacemaker 3.  Mild to moderate aortic stenosis on echocardiogram 2021 4.  HIV 5.  Hepatitis C 6.  Hypertension 7.  GERD 8.  Hyperlipidemia  Nicole Mccormick is a 75 y.o. female with the above listed medical problems here to reestablish cardiovascular care.  The patient referred by Dr. Johnnye Sima.  He noted that her murmur of aortic stenosis was increased in intensity and ordered an echocardiogram which has not yet been done.      Previous Medical/Surgical History:    Past Medical History:  Diagnosis Date   Abnormal Pap smear    Aortic stenosis    TTE 2021 with mean gradient 47mmHg   Arthritis    Asthma    AVM (arteriovenous malformation)    Bradycardia    Complication of anesthesia    "they have a hard time bringing me back" (11/08/2015)   Coronary artery disease    High grade RCA disease; diagnosed during acute GIB; on medical therapy   Dynamic left ventricular outflow obstruction    GERD (gastroesophageal reflux disease)    previously on aciphex, discontinued november 2012  because patient asymptomatic, and concern about interference with HIV meds.  May try pepcid in the future if symptoms return   Heart murmur    Hepatitis C antibody test positive    HIV infection ([place])    diagnosed before 2008   Hyperlipidemia 09/12/2021   Hypertension     Hypertensive heart disease    Iron deficiency anemia    Ferritin = 2 in november 2012, started on iron supplemenation   Pacemaker    a. [employer] Jude PPM 01/2018.   Seizures ([place])    Symptomatic anemia 08/24/2020   Varicosities     Past Surgical History:  Procedure Laterality Date   ABDOMINAL HYSTERECTOMY     CARDIAC CATHETERIZATION N/A 11/08/2015   Procedure: Left Heart Cath and Coronary Angiography;  Surgeon: Jettie Booze, MD;  Location: [place] [facility];  Service: Cardiovascular;  Laterality: N/A;   COLONOSCOPY  10/13/11   small adenoma, anal condyloma   COLONOSCOPY  10/13/2011   Procedure: COLONOSCOPY;  Surgeon: Gatha Mayer, MD;  Location: [place];  Service: Endoscopy;  Laterality: N/A;   COLONOSCOPY N/A 09/14/2014   Procedure: COLONOSCOPY;  Surgeon: Arta Silence, MD;  Location: [facility] ENDOSCOPY;  Service: Endoscopy;  Laterality: N/A;   COLONOSCOPY WITH PROPOFOL N/A 06/05/2020   Procedure: COLONOSCOPY WITH PROPOFOL;  Surgeon: Arta Silence, MD;  Location: [place];  Service: Endoscopy;  Laterality: N/A;   ENTEROSCOPY N/A 10/31/2020   Procedure: ENTEROSCOPY;  Surgeon: Otis Brace, MD;  Location: [facility] ENDOSCOPY;  Service: Gastroenterology;  Laterality: N/A;   ESOPHAGOGASTRODUODENOSCOPY  10/13/11   small hiatal hernia   ESOPHAGOGASTRODUODENOSCOPY  10/13/2011   Procedure: ESOPHAGOGASTRODUODENOSCOPY (EGD);  Surgeon: Gatha Mayer, MD;  Location: [facility] ENDOSCOPY;  Service: Endoscopy;  Laterality: N/A;  ESOPHAGOGASTRODUODENOSCOPY N/A 09/14/2014   Procedure: ESOPHAGOGASTRODUODENOSCOPY (EGD);  Surgeon: Arta Silence, MD;  Location: [facility] ENDOSCOPY;  Service: Endoscopy;  Laterality: N/A;   ESOPHAGOGASTRODUODENOSCOPY (EGD) WITH PROPOFOL N/A 06/05/2020   Procedure: ESOPHAGOGASTRODUODENOSCOPY (EGD) WITH PROPOFOL;  Surgeon: Arta Silence, MD;  Location: [place];  Service: Endoscopy;  Laterality: N/A;   GIVENS CAPSULE STUDY N/A 09/14/2014   Procedure: GIVENS CAPSULE STUDY;   Surgeon: Arta Silence, MD;  Location: [facility] ENDOSCOPY;  Service: Endoscopy;  Laterality: N/A;   GIVENS CAPSULE STUDY N/A 08/26/2020   Procedure: GIVENS CAPSULE STUDY;  Surgeon: Wonda Horner, MD;  Location: [facility] ENDOSCOPY;  Service: Endoscopy;  Laterality: N/A;   HOT HEMOSTASIS N/A 10/31/2020   Procedure: HOT HEMOSTASIS (ARGON PLASMA COAGULATION/BICAP);  Surgeon: Otis Brace, MD;  Location: [facility] ENDOSCOPY;  Service: Gastroenterology;  Laterality: N/A;   PACEMAKER IMPLANT N/A 02/05/2018   Procedure: PACEMAKER IMPLANT;  Surgeon: Thompson Grayer, MD;  Location: [place] [facility];  Service: Cardiovascular;  Laterality: N/A;   POLYPECTOMY  06/05/2020   Procedure: POLYPECTOMY;  Surgeon: Arta Silence, MD;  Location: [facility] ENDOSCOPY;  Service: Endoscopy;;    Current Medications: No outpatient medications have been marked as taking for the 10/04/21 encounter (Appointment) with Early Osmond, MD.     Allergies:   Penicillins, Penicillin g, and Avelox [moxifloxacin hcl in nacl]   Social History:    Social History   Socioeconomic History   Marital status: Married    Spouse name: Not on file   Number of children: 6   Years of education: Not on file   Highest education level: Not on file  Occupational History   Occupation: retired  Tobacco Use   Smoking status: Every Day    Packs/day: 1.00    Years: 60.00    Pack years: 60.00    Types: Cigarettes   Smokeless tobacco: Never   Tobacco comments:    Wants to quit. Using both the patch and gum.  Vaping Use   Vaping Use: Never used  Substance and Sexual Activity   Alcohol use: Yes    Alcohol/week: 10.0 standard drinks    Types: 10 Glasses of wine per week    Comment: drinking less recently, denies h/o heavy use   Drug use: No   Sexual activity: Yes    Partners: Male    Birth control/protection: Surgical    Comment: pt. given condoms 08/23/20  Other Topics Concern   Not on file  Social History Narrative   Not on file   Social  Determinants of Health   Financial Resource Strain: Not on file  Food Insecurity: Not on file  Transportation Needs: Not on file  Physical Activity: Not on file  Stress: Not on file  Social Connections: Not on file     Family History:  The patient's family history includes Colon cancer in her sister; Diabetes in her mother; Ovarian cancer in her sister.  ROS:  Please see the history of present illness.  All other systems reviewed and are negative.  EKGs/Labs/Other Studies Reviewed:    The following studies were reviewed today: Cardiac catheterization and echocardiogram as detailed above.  EKG:   From November 2011 demonstrates a pacing.  Recent Labs: 08/17/2021: ALT 9; BUN 26; Creat 0.92; Hemoglobin 12.5; Platelets 213; Potassium 3.8; Sodium 142   Recent Lipid Panel    Component Value Date/Time   CHOL 111 11/10/2017 1417   TRIG 89 11/10/2017 1417   HDL 59 11/10/2017 1417   CHOLHDL 1.9 11/10/2017 1417  VLDL 13 05/15/2017 1646   LDLCALC 35 11/10/2017 1417     Risk Assessment/Calculations:   {Does this patient have ATRIAL FIBRILLATION?:[id number]}       Physical Exam:    VS:  There were no vitals taken for this visit.    Wt Readings from Last 3 Encounters:  08/17/21 128 lb (58.1 kg)  08/01/21 128 lb 4.8 oz (58.2 kg)  04/25/21 128 lb 11.2 oz (58.4 kg)     GEN: *** Well nourished, well developed in no acute distress HEENT: Normal NECK: No JVD; No carotid bruits LYMPHATICS: No lymphadenopathy CARDIAC: ***RRR, no murmurs, rubs, gallops RESPIRATORY:  Clear to auscultation without rales, wheezing or rhonchi  ABDOMEN: Soft, non-tender, non-distended MUSCULOSKELETAL:  No edema; No deformity  SKIN: Warm and dry NEUROLOGIC:  Alert and oriented x 3 PSYCHIATRIC:  Normal affect   ASSESSMENT and PLAN   CAD (coronary artery disease) Will restart ASA as she is on a PPI and her GIB was in 2016.  Essential hypertension, benign ....  Aortic stenosis Will obtain  echocardiogram to assess.    Hyperlipidemia Will check lipid panel as LDL goal < 70.  LFTs recently were normal.         {Are you ordering a CV Procedure (e.g. stress test, cath, DCCV, TEE, etc)?   Press F2        :[id number]}    Medication Adjustments/Labs and Tests Ordered: Current medicines are reviewed at length with the patient today.  Concerns regarding medicines are outlined above.  No orders of the defined types were placed in this encounter.  No orders of the defined types were placed in this encounter.   There are no Patient Instructions on file for this visit.   Signed, Early Osmond, MD  10/03/2021 10:34 AM    [facility]

## 2021-10-03 NOTE — Assessment & Plan Note (Deleted)
Will check lipid panel as LDL goal < 70.  LFTs recently were normal.

## 2021-10-03 NOTE — Assessment & Plan Note (Deleted)
Will obtain echocardiogram to assess.

## 2021-10-04 ENCOUNTER — Ambulatory Visit: Payer: 59 | Admitting: Internal Medicine

## 2021-10-11 ENCOUNTER — Encounter: Payer: Self-pay | Admitting: Hematology and Oncology

## 2021-10-31 ENCOUNTER — Other Ambulatory Visit: Payer: Self-pay

## 2021-10-31 ENCOUNTER — Ambulatory Visit (HOSPITAL_COMMUNITY)
Admission: RE | Admit: 2021-10-31 | Discharge: 2021-10-31 | Disposition: A | Discharge status: Home / Self Care | Payer: 59 | Source: Ambulatory Visit | Attending: Infectious Diseases | Admitting: Infectious Diseases

## 2021-10-31 ENCOUNTER — Other Ambulatory Visit: Payer: Self-pay | Admitting: Infectious Diseases

## 2021-10-31 DIAGNOSIS — I350 Nonrheumatic aortic (valve) stenosis: Secondary | ICD-10-CM

## 2021-10-31 DIAGNOSIS — Z21 Asymptomatic human immunodeficiency virus [HIV] infection status: Secondary | ICD-10-CM | POA: Diagnosis not present

## 2021-10-31 DIAGNOSIS — I2510 Atherosclerotic heart disease of native coronary artery without angina pectoris: Secondary | ICD-10-CM | POA: Insufficient documentation

## 2021-10-31 DIAGNOSIS — I080 Rheumatic disorders of both mitral and aortic valves: Secondary | ICD-10-CM | POA: Diagnosis not present

## 2021-10-31 DIAGNOSIS — I10 Essential (primary) hypertension: Secondary | ICD-10-CM | POA: Insufficient documentation

## 2021-10-31 LAB — ECHOCARDIOGRAM COMPLETE
AR max vel: 1.6 cm2
AV Area VTI: 1.99 cm2
AV Area mean vel: 2.17 cm2
AV Mean grad: 20.3 mmHg
AV Peak grad: 43.5 mmHg
Ao pk vel: 3.3 m/s
Area-P 1/2: 1.73 cm2
S' Lateral: 1.9 cm
Single Plane A4C EF: 70.3 %

## 2021-10-31 NOTE — Progress Notes (Signed)
  Echocardiogram 2D Echocardiogram has been performed.  Nicole Mccormick F 10/31/2021, 2:58 PM

## 2021-11-07 ENCOUNTER — Ambulatory Visit: Payer: 59

## 2021-11-07 ENCOUNTER — Telehealth: Payer: Self-pay

## 2021-11-07 NOTE — Telephone Encounter (Signed)
Reach out to patient regarding her vaccine appointment today. Patient states she will see her PCP next week and up date these vaccine at that time. Appointment for today canceled. Nicole Mccormick

## 2021-11-20 ENCOUNTER — Other Ambulatory Visit: Payer: Self-pay | Admitting: Family

## 2021-11-20 DIAGNOSIS — M549 Dorsalgia, unspecified: Secondary | ICD-10-CM

## 2021-11-21 ENCOUNTER — Ambulatory Visit (INDEPENDENT_AMBULATORY_CARE_PROVIDER_SITE_OTHER): Payer: 59

## 2021-11-21 DIAGNOSIS — I495 Sick sinus syndrome: Secondary | ICD-10-CM | POA: Diagnosis not present

## 2021-11-26 LAB — CUP PACEART REMOTE DEVICE CHECK
Battery Remaining Longevity: 79 mo
Battery Remaining Percentage: 65 %
Battery Voltage: 2.99 V
Brady Statistic AP VP Percent: 1.4 %
Brady Statistic AP VS Percent: 94 %
Brady Statistic AS VP Percent: 1 %
Brady Statistic AS VS Percent: 4.6 %
Brady Statistic RA Percent Paced: 94 %
Brady Statistic RV Percent Paced: 1.5 %
Date Time Interrogation Session: 20221216204906
Implantable Lead Implant Date: 20190228
Implantable Lead Implant Date: 20190228
Implantable Lead Location: 753859
Implantable Lead Location: 753860
Implantable Pulse Generator Implant Date: 20190228
Lead Channel Impedance Value: 360 Ohm
Lead Channel Impedance Value: 560 Ohm
Lead Channel Pacing Threshold Amplitude: 0.5 V
Lead Channel Pacing Threshold Amplitude: 0.625 V
Lead Channel Pacing Threshold Pulse Width: 0.5 ms
Lead Channel Pacing Threshold Pulse Width: 0.5 ms
Lead Channel Sensing Intrinsic Amplitude: 10.6 mV
Lead Channel Sensing Intrinsic Amplitude: 4.4 mV
Lead Channel Setting Pacing Amplitude: 0.875
Lead Channel Setting Pacing Amplitude: 1.5 V
Lead Channel Setting Pacing Pulse Width: 0.5 ms
Lead Channel Setting Sensing Sensitivity: 2 mV
Pulse Gen Model: 2272
Pulse Gen Serial Number: 8996997

## 2021-12-04 NOTE — Progress Notes (Signed)
Remote pacemaker transmission.   

## 2021-12-11 ENCOUNTER — Other Ambulatory Visit: Payer: Self-pay

## 2021-12-11 ENCOUNTER — Other Ambulatory Visit: Payer: Self-pay | Admitting: Family

## 2021-12-11 DIAGNOSIS — E43 Unspecified severe protein-calorie malnutrition: Secondary | ICD-10-CM

## 2021-12-11 MED ORDER — ENSURE PO LIQD: 237.0000 mL | Freq: Two times a day (BID) | ORAL | 5 refills | Status: DC

## 2022-01-23 ENCOUNTER — Other Ambulatory Visit: Payer: Self-pay | Admitting: Hematology and Oncology

## 2022-01-28 ENCOUNTER — Inpatient Hospital Stay: Payer: 59

## 2022-01-28 ENCOUNTER — Inpatient Hospital Stay: Payer: 59 | Attending: Hematology and Oncology | Admitting: Hematology and Oncology

## 2022-01-28 ENCOUNTER — Other Ambulatory Visit: Payer: Self-pay | Admitting: Hematology and Oncology

## 2022-01-28 DIAGNOSIS — D500 Iron deficiency anemia secondary to blood loss (chronic): Secondary | ICD-10-CM

## 2022-02-06 ENCOUNTER — Inpatient Hospital Stay (HOSPITAL_BASED_OUTPATIENT_CLINIC_OR_DEPARTMENT_OTHER): Payer: 59 | Admitting: Hematology and Oncology

## 2022-02-06 ENCOUNTER — Other Ambulatory Visit: Payer: Self-pay

## 2022-02-06 ENCOUNTER — Inpatient Hospital Stay: Payer: 59 | Attending: Hematology and Oncology

## 2022-02-06 VITALS — BP 136/86 | HR 80 | Temp 97.3°F | Resp 16 | Wt 128.7 lb

## 2022-02-06 DIAGNOSIS — D500 Iron deficiency anemia secondary to blood loss (chronic): Secondary | ICD-10-CM

## 2022-02-06 DIAGNOSIS — K219 Gastro-esophageal reflux disease without esophagitis: Secondary | ICD-10-CM | POA: Diagnosis not present

## 2022-02-06 DIAGNOSIS — D7589 Other specified diseases of blood and blood-forming organs: Secondary | ICD-10-CM | POA: Insufficient documentation

## 2022-02-06 DIAGNOSIS — I119 Hypertensive heart disease without heart failure: Secondary | ICD-10-CM | POA: Diagnosis not present

## 2022-02-06 DIAGNOSIS — Z79899 Other long term (current) drug therapy: Secondary | ICD-10-CM | POA: Diagnosis not present

## 2022-02-06 DIAGNOSIS — J45909 Unspecified asthma, uncomplicated: Secondary | ICD-10-CM | POA: Diagnosis not present

## 2022-02-06 DIAGNOSIS — E785 Hyperlipidemia, unspecified: Secondary | ICD-10-CM | POA: Insufficient documentation

## 2022-02-06 DIAGNOSIS — I350 Nonrheumatic aortic (valve) stenosis: Secondary | ICD-10-CM | POA: Insufficient documentation

## 2022-02-06 DIAGNOSIS — K5900 Constipation, unspecified: Secondary | ICD-10-CM | POA: Diagnosis not present

## 2022-02-06 DIAGNOSIS — I2510 Atherosclerotic heart disease of native coronary artery without angina pectoris: Secondary | ICD-10-CM | POA: Insufficient documentation

## 2022-02-06 LAB — CBC WITH DIFFERENTIAL (CANCER CENTER ONLY)
Abs Immature Granulocytes: 0.03 10*3/uL (ref 0.00–0.07)
Basophils Absolute: 0 10*3/uL (ref 0.0–0.1)
Basophils Relative: 0 %
Eosinophils Absolute: 0 10*3/uL (ref 0.0–0.5)
Eosinophils Relative: 0 %
HCT: 39 % (ref 36.0–46.0)
Hemoglobin: 13 g/dL (ref 12.0–15.0)
Immature Granulocytes: 1 %
Lymphocytes Relative: 13 %
Lymphs Abs: 0.7 10*3/uL (ref 0.7–4.0)
MCH: 34.9 pg — ABNORMAL HIGH (ref 26.0–34.0)
MCHC: 33.3 g/dL (ref 30.0–36.0)
MCV: 104.8 fL — ABNORMAL HIGH (ref 80.0–100.0)
Monocytes Absolute: 0.1 10*3/uL (ref 0.1–1.0)
Monocytes Relative: 3 %
Neutro Abs: 4.8 10*3/uL (ref 1.7–7.7)
Neutrophils Relative %: 83 %
Platelet Count: 196 10*3/uL (ref 150–400)
RBC: 3.72 MIL/uL — ABNORMAL LOW (ref 3.87–5.11)
RDW: 13.7 % (ref 11.5–15.5)
WBC Count: 5.7 10*3/uL (ref 4.0–10.5)
nRBC: 0 % (ref 0.0–0.2)

## 2022-02-06 LAB — IRON AND IRON BINDING CAPACITY (CC-WL,HP ONLY)
Iron: 115 ug/dL (ref 28–170)
Saturation Ratios: 26 % (ref 10.4–31.8)
TIBC: 448 ug/dL (ref 250–450)
UIBC: 333 ug/dL (ref 148–442)

## 2022-02-06 LAB — CMP (CANCER CENTER ONLY)
ALT: 13 U/L (ref 0–44)
AST: 14 U/L — ABNORMAL LOW (ref 15–41)
Albumin: 4.2 g/dL (ref 3.5–5.0)
Alkaline Phosphatase: 66 U/L (ref 38–126)
Anion gap: 6 (ref 5–15)
BUN: 30 mg/dL — ABNORMAL HIGH (ref 8–23)
CO2: 32 mmol/L (ref 22–32)
Calcium: 9.8 mg/dL (ref 8.9–10.3)
Chloride: 101 mmol/L (ref 98–111)
Creatinine: 0.92 mg/dL (ref 0.44–1.00)
GFR, Estimated: >60 mL/min (ref >60)
Glucose, Bld: 118 mg/dL — ABNORMAL HIGH (ref 70–99)
Potassium: 4 mmol/L (ref 3.5–5.1)
Sodium: 139 mmol/L (ref 135–145)
Total Bilirubin: 0.4 mg/dL (ref 0.3–1.2)
Total Protein: 7.6 g/dL (ref 6.5–8.1)

## 2022-02-06 LAB — RETIC PANEL
Immature Retic Fract: 24.5 % — ABNORMAL HIGH (ref 2.3–15.9)
RBC.: 3.76 MIL/uL — ABNORMAL LOW (ref 3.87–5.11)
Retic Count, Absolute: 126.7 10*3/uL (ref 19.0–186.0)
Retic Ct Pct: 3.4 % — ABNORMAL HIGH (ref 0.4–3.1)
Reticulocyte Hemoglobin: 36.5 pg (ref >27.9)

## 2022-02-06 LAB — FERRITIN: Ferritin: 15 ng/mL (ref 11–307)

## 2022-02-06 NOTE — Progress Notes (Signed)
[place] Telephone:([phone]) [phone]   Fax:([phone]) [phone]  PROGRESS NOTE  Patient Care Team: Sonia Side., FNP as PCP - General (Family Medicine) Johnnye Sima Doroteo Bradford, MD as PCP - Infectious Diseases (Infectious Diseases) Thompson Grayer, MD as PCP - Electrophysiology (Cardiology) Arta Silence, MD as Consulting Physician (Gastroenterology)  Hematological/Oncological History # Iron Deficiency Anemia 2/2 to GI Bleed  10/29/2020: patient presented the ED with weakness and was found to have a Hgb of 4.1 11/01/2020: GI evaluation showed numerous bleeding/nonbleeding AVMS. Cauterization performed.  12/18/2020: Iron 32, TIBC 441, Sat 7%, Ferritin 31. Hgb 8.3, Plt 313, WBC 4.2, MCV 102.5.  01/03/2021: establish care with Dr. Lorenso Courier  2/4-2/09/2021: IV feraheme 510mg  x 2 doses. Received 1 unit of PRBC on 01/12/2021  08/01/2021: WBC 4.5, Hgb 12.1, MCV 104.9, Plt 223 02/06/2022: WBC 5.7, Hgb 13.0, MCV 104.8, Plt 196  Interval History:  Nicole Mccormick 76 y.o. female with medical history significant for iron deficiency anemia 2/2 to GI bleed who presents for a follow up visit. The patient's last visit was on 08/01/2021. In the interim since the last visit she has been in her usual state of health.   On exam today Nicole Mccormick notes she has been well overall interim since her last visit.  Her energy levels have been decreased lately and she does have issues with shortness of breath.  She notes that it is improved when she uses her puffers and inhalers.  She notes that she is taking her iron pills twice daily and is not causing any stomach upset other than some occasional pain and cramping with mild constipation.  She notes she is not seeing any overt signs of bleeding or bruising.  She does try to eat red meat on a regular basis.  She does continue to drink 1 glass of wine per day.  Her weight has been stable and she otherwise denies any fevers, chills, sweats, nausea, vomiting or diarrhea.  She denies  any overt signs of bleeding or bruising.  A full 10 point ROS is listed below.  MEDICAL HISTORY:  Past Medical History:  Diagnosis Date   Abnormal Pap smear    Aortic stenosis    TTE 2021 with mean gradient 6mmHg   Arthritis    Asthma    AVM (arteriovenous malformation)    Bradycardia    Complication of anesthesia    "they have a hard time bringing me back" (11/08/2015)   Coronary artery disease    High grade RCA disease; diagnosed during acute GIB; on medical therapy   Dynamic left ventricular outflow obstruction    GERD (gastroesophageal reflux disease)    previously on aciphex, discontinued november 2012  because patient asymptomatic, and concern about interference with HIV meds.  May try pepcid in the future if symptoms return   Heart murmur    Hepatitis C antibody test positive    HIV infection ([place])    diagnosed before 2008   Hyperlipidemia 09/12/2021   Hypertension    Hypertensive heart disease    Iron deficiency anemia    Ferritin = 2 in november 2012, started on iron supplemenation   Pacemaker    a. [employer] Jude PPM 01/2018.   Seizures ([place])    Symptomatic anemia 08/24/2020   Varicosities     SURGICAL HISTORY: Past Surgical History:  Procedure Laterality Date   ABDOMINAL HYSTERECTOMY     CARDIAC CATHETERIZATION N/A 11/08/2015   Procedure: Left Heart Cath and Coronary Angiography;  Surgeon: Charlann Lange  Irish Lack, MD;  Location: [place] [facility];  Service: Cardiovascular;  Laterality: N/A;   COLONOSCOPY  10/13/11   small adenoma, anal condyloma   COLONOSCOPY  10/13/2011   Procedure: COLONOSCOPY;  Surgeon: Gatha Mayer, MD;  Location: [place];  Service: Endoscopy;  Laterality: N/A;   COLONOSCOPY N/A 09/14/2014   Procedure: COLONOSCOPY;  Surgeon: Arta Silence, MD;  Location: [facility] ENDOSCOPY;  Service: Endoscopy;  Laterality: N/A;   COLONOSCOPY WITH PROPOFOL N/A 06/05/2020   Procedure: COLONOSCOPY WITH PROPOFOL;  Surgeon: Arta Silence, MD;  Location: [place];   Service: Endoscopy;  Laterality: N/A;   ENTEROSCOPY N/A 10/31/2020   Procedure: ENTEROSCOPY;  Surgeon: Otis Brace, MD;  Location: [facility] ENDOSCOPY;  Service: Gastroenterology;  Laterality: N/A;   ESOPHAGOGASTRODUODENOSCOPY  10/13/11   small hiatal hernia   ESOPHAGOGASTRODUODENOSCOPY  10/13/2011   Procedure: ESOPHAGOGASTRODUODENOSCOPY (EGD);  Surgeon: Gatha Mayer, MD;  Location: [facility] ENDOSCOPY;  Service: Endoscopy;  Laterality: N/A;   ESOPHAGOGASTRODUODENOSCOPY N/A 09/14/2014   Procedure: ESOPHAGOGASTRODUODENOSCOPY (EGD);  Surgeon: Arta Silence, MD;  Location: [facility] ENDOSCOPY;  Service: Endoscopy;  Laterality: N/A;   ESOPHAGOGASTRODUODENOSCOPY (EGD) WITH PROPOFOL N/A 06/05/2020   Procedure: ESOPHAGOGASTRODUODENOSCOPY (EGD) WITH PROPOFOL;  Surgeon: Arta Silence, MD;  Location: [place];  Service: Endoscopy;  Laterality: N/A;   GIVENS CAPSULE STUDY N/A 09/14/2014   Procedure: GIVENS CAPSULE STUDY;  Surgeon: Arta Silence, MD;  Location: [facility] ENDOSCOPY;  Service: Endoscopy;  Laterality: N/A;   GIVENS CAPSULE STUDY N/A 08/26/2020   Procedure: GIVENS CAPSULE STUDY;  Surgeon: Wonda Horner, MD;  Location: [facility] END[facility]COPY;  Service: Endoscopy;  Laterality: N/A;   HOT HEMOSTASIS N/A 10/31/2020   Procedure: HOT HEMOSTASIS (ARGON PLASMA COAGULATION/BICAP);  Surgeon: Otis Brace, MD;  Location: [facility] ENDOSCOPY;  Service: Gastroenterology;  Laterality: N/A;   PACEMAKER IMPLANT N/A 02/05/2018   Procedure: PACEMAKER IMPLANT;  Surgeon: Thompson Grayer, MD;  Location: [place] [facility];  Service: Cardiovascular;  Laterality: N/A;   POLYPECTOMY  06/05/2020   Procedure: POLYPECTOMY;  Surgeon: Arta Silence, MD;  Location: [facility] ENDOSCOPY;  Service: Endoscopy;;    SOCIAL HISTORY: Social History   Socioeconomic History   Marital status: Married    Spouse name: Not on file   Number of children: 6   Years of education: Not on file   Highest education level: Not on file  Occupational History   Occupation:  retired  Tobacco Use   Smoking status: Every Day    Packs/day: 1.00    Years: 60.00    Pack years: 60.00    Types: Cigarettes   Smokeless tobacco: Never   Tobacco comments:    Wants to quit. Using both the patch and gum.  Vaping Use   Vaping Use: Never used  Substance and Sexual Activity   Alcohol use: Yes    Alcohol/week: 10.0 standard drinks    Types: 10 Glasses of wine per week    Comment: drinking less recently, denies h/o heavy use   Drug use: No   Sexual activity: Yes    Partners: Male    Birth control/protection: Surgical    Comment: pt. given condoms 08/23/20  Other Topics Concern   Not on file  Social History Narrative   Not on file   Social Determinants of Health   Financial Resource Strain: Not on file  Food Insecurity: Not on file  Transportation Needs: Not on file  Physical Activity: Not on file  Stress: Not on file  Social Connections: Not on file  Intimate Partner Violence: Not  on file    FAMILY HISTORY: Family History  Problem Relation Age of Onset   Diabetes Mother    Ovarian cancer Sister    Colon cancer Sister     ALLERGIES:  is allergic to penicillins, penicillin g, and avelox [moxifloxacin hcl in nacl].  MEDICATIONS:  Current Outpatient Medications  Medication Sig Dispense Refill   acyclovir (ZOVIRAX) 400 MG tablet Take 1 tablet (400 mg total) by mouth daily as needed (for flare ups). 30 tablet 3   albuterol (PROAIR HFA) 108 (90 Base) MCG/ACT inhaler INHALE 2 PUFFS BY MOUTH EVERY 4 HOURS IF NEEDED FOR WHEEZING OR SHORTNESS OF BREATH (Patient taking differently: Inhale 2 puffs into the lungs every 4 (four) hours as needed for wheezing or shortness of breath. ) 8.5 g 0   ALPRAZolam (XANAX) 0.5 MG tablet Take 0.5 mg by mouth 3 (three) times daily as needed for anxiety.     amLODipine (NORVASC) 10 MG tablet TAKE 1 TABLET BY MOUTH EVERY DAY (Patient taking differently: Take 10 mg by mouth daily. ) 90 tablet 0   Ascorbic Acid (VITAMIN C) 1000 MG  tablet Take 1,000 mg by mouth daily.     atorvastatin (LIPITOR) 40 MG tablet take 1 tablet by mouth once daily AT 6 PM (Patient taking differently: Take 40 mg by mouth every evening. ) 90 tablet 0   BIKTARVY 50-200-25 MG TABS tablet TAKE 1 TABLET BY MOUTH EVERY DAY (Patient taking differently: Take 1 tablet by mouth daily. ) 90 tablet 3   carvedilol (COREG) 3.125 MG tablet TAKE 1 TABLET(3.125 MG) BY MOUTH TWICE DAILY (Patient taking differently: Take 3.125 mg by mouth 2 (two) times daily with a meal. ) 180 tablet 3   cholecalciferol (VITAMIN D3) 25 MCG (1000 UNIT) tablet Take 1,000 Units by mouth daily.     dextromethorphan (DELSYM) 30 MG/5ML liquid Take 30 mg by mouth as needed for cough.      diazepam (VALIUM) 2 MG tablet take 1 tablet by mouth every 6 hours if needed for anxiety (Patient not taking: Reported on 04/25/2021) 30 tablet 0   diclofenac (VOLTAREN) 75 MG EC tablet Take 1 tablet (75 mg total) by mouth 2 (two) times daily as needed. Do not take until finished with steroid taper 60 tablet 2   Ensure (ENSURE) Take 237 mLs by mouth 2 (two) times daily between meals. 5688 mL 5   escitalopram (LEXAPRO) 10 MG tablet Take 10 mg by mouth daily as needed (anxiety).  (Patient not taking: Reported on 04/25/2021)     famotidine (PEPCID) 20 MG tablet Take 20 mg by mouth 2 (two) times daily.     ferrous sulfate 325 (65 FE) MG EC tablet Take 1 tablet (325 mg total) by mouth in the morning and at bedtime. 180 tablet 0   Flaxseed, Linseed, (FLAX SEED OIL) 1000 MG CAPS Take 1,000 mg by mouth daily.     folic acid (FOLVITE) 1 MG tablet TAKE 1 TABLET BY MOUTH EVERY DAY 90 tablet 1   gabapentin (NEURONTIN) 100 MG capsule Take 100 mg by mouth 3 (three) times daily.     gabapentin (NEURONTIN) 300 MG capsule      hydrochlorothiazide (HYDRODIURIL) 25 MG tablet Take 25 mg by mouth daily.      HYDROcodone-acetaminophen (NORCO/VICODIN) 5-325 MG tablet Take 1 tablet by mouth 2 (two) times daily as needed.      isosorbide mononitrate (IMDUR) 60 MG 24 hr tablet Take 1 tablet (60 mg total) by mouth daily.  90 tablet 1   lisinopril (ZESTRIL) 40 MG tablet Take 40 mg by mouth daily.     loratadine (CLARITIN) 10 MG tablet 1 tablet     meclizine (ANTIVERT) 25 MG tablet Take 25 mg by mouth as needed for dizziness.      methocarbamol (ROBAXIN) 500 MG tablet Take 1 tablet (500 mg total) by mouth 2 (two) times daily as needed. 20 tablet 1   Omega-3 Fatty Acids (FISH OIL) 1000 MG CPDR Take 1,000 mg by mouth daily.     ondansetron (ZOFRAN ODT) 4 MG disintegrating tablet 1 tablet on the tongue and allow to dissolve     pantoprazole (PROTONIX) 40 MG tablet Take 1 tablet (40 mg total) by mouth daily. 90 tablet 0   Polyethylene Glycol 1000 POWD Mix 17g (1 capful) in a drink     predniSONE (STERAPRED UNI-PAK 21 TAB) 10 MG (21) TBPK tablet Take as directed 21 tablet 0   spironolactone (ALDACTONE) 25 MG tablet TAKE 1 TABLET BY MOUTH EVERY DAY (Patient taking differently: Take 25 mg by mouth daily. ) 30 tablet 0   sucralfate (CARAFATE) 1 g tablet 1 tablet on an empty stomach     SYMBICORT 160-4.5 MCG/ACT inhaler Inhale 2 puffs into the lungs 2 (two) times daily.     vitamin A 10000 UNIT capsule Take 10,000 Units by mouth daily.     vitamin B-12 (CYANOCOBALAMIN) 1000 MCG tablet TAKE 1 TABLET BY MOUTH EVERY DAY 90 tablet 1   No current facility-administered medications for this visit.    REVIEW OF SYSTEMS:   Constitutional: ( - ) fevers, ( - )  chills , ( - ) night sweats Eyes: ( - ) blurriness of vision, ( - ) double vision, ( - ) watery eyes Ears, nose, mouth, throat, and face: ( - ) mucositis, ( - ) sore throat Respiratory: ( - ) cough, ( - ) dyspnea, ( - ) wheezes Cardiovascular: ( - ) palpitation, ( - ) chest discomfort, ( - ) lower extremity swelling Gastrointestinal:  ( - ) nausea, ( - ) heartburn, ( - ) change in bowel habits Skin: ( - ) abnormal skin rashes Lymphatics: ( - ) new lymphadenopathy, ( - ) easy  bruising Neurological: ( - ) numbness, ( - ) tingling, ( - ) new weaknesses Behavioral/Psych: ( - ) mood change, ( - ) new changes  All other systems were reviewed with the patient and are negative.  PHYSICAL EXAMINATION:  Vitals:   02/06/22 0755  BP: 136/86  Pulse: 80  Resp: 16  Temp: (!) 97.3 F (36.3 C)  SpO2: 97%   Filed Weights   02/06/22 0755  Weight: 128 lb 11.2 oz (58.4 kg)    GENERAL: well appearing elderly African American female. alert, no distress and comfortable SKIN: skin color, texture, turgor are normal, no rashes or significant lesions EYES: conjunctiva are pink and non-injected, sclera clear LUNGS: clear to auscultation and percussion with normal breathing effort HEART: regular rate & rhythm and no murmurs and no lower extremity edema Musculoskeletal: no cyanosis of digits and no clubbing  PSYCH: alert & oriented x 3, fluent speech NEURO: no focal motor/sensory deficits  LABORATORY DATA:  I have reviewed the data as listed CBC Latest Ref Rng & Units 02/06/2022 08/17/2021 08/01/2021  WBC 4.0 - 10.5 K/uL 5.7 4.2 4.5  Hemoglobin 12.0 - 15.0 g/dL 13.0 12.5 12.1  Hematocrit 36.0 - 46.0 % 39.0 38.7 36.5  Platelets 150 - 400 K/uL 196  213 223    CMP Latest Ref Rng & Units 02/06/2022 08/17/2021 08/01/2021  Glucose 70 - 99 mg/dL 118(H) 100(H) 96  BUN 8 - 23 mg/dL 30(H) 26(H) 23  Creatinine 0.44 - 1.00 mg/dL 0.92 0.92 1.04(H)  Sodium 135 - 145 mmol/L 139 142 139  Potassium 3.5 - 5.1 mmol/L 4.0 3.8 4.0  Chloride 98 - 111 mmol/L 101 106 103  CO2 22 - 32 mmol/L 32 28 27  Calcium 8.9 - 10.3 mg/dL 9.8 9.8 9.5  Total Protein 6.5 - 8.1 g/dL 7.6 7.6 7.9  Total Bilirubin 0.3 - 1.2 mg/dL 0.4 0.4 0.4  Alkaline Phos 38 - 126 U/L 66 - 68  AST 15 - 41 U/L 14(L) 16 15  ALT 0 - 44 U/L 13 9 9     RADIOGRAPHIC STUDIES: No results found.  ASSESSMENT & PLAN Nicole Mccormick 76 y.o. female with medical history significant for iron deficiency anemia 2/2 to GI bleed who presents for a  follow up visit.   After review the labs, the records, discussion the patient the findings most consistent with an iron deficiency anemia responding to IV iron therapy.  Given the macrocytosis I would recommend that the patient d/c ETOH use while she continues vitamin B12 and folate therapy.  She also reports that she drinks approximately 1 glass of wine per night which may be partially to blame for her macrocytosis.  We will plan to have her repeat labs with clinic visit in 6 months The patient voiced understanding of this plan moving forward.  # Iron Deficiency Anemia 2/2 to GI Bleed   -- findings were consistent with persistent iron deficiency anemia despite adequate PO iron therapy --Hgb today improved to 13.0, MCV 104.8 --patient is s/p 510mg  IV feraheme q 7 days x 2 doses on 2/4-2/10/22.  --today will order repeat iron panel, ferritin, CBC, CMP and reticulocyte panel  --retic panel shows a modest increase in reticulocyte count, with reticulocytosis near adequate. This implies some continued GI blood loss. --continue PO ferrous sulfate 325mg  daily. Take with a source of Vitamin C (orange juice best)  --RTC in 6 months   #Macrocytosis --given the timing of this macrocytosis (right around when she started drinking 1 glass of wine 3-4 x per week), ETOH could be the cause of this macrocytosis. She has decreased to 2 glasses per week.  --Hgb improved to 13.0, MCV 104.8 today.  --less likely secondary to deficiency with folate or vitamin b12 as she has been supplementing these nutrients --potentially represent robust reticulocytosis, but ETOH seems more likely at this time.   --patient not currently on any medications known to cause macrocytosis.   No orders of the defined types were placed in this encounter.  All questions were answered. The patient knows to call the clinic with any problems, questions or concerns.  A total of more than 30 minutes were spent on this encounter and over half of  that time was spent on counseling and coordination of care as outlined above.   Nicole Peoples, MD Department of Hematology/Oncology [place] at [facility] Phone: [phone] Pager: [phone] Email: [email or web address]  02/06/2022 8:56 AM

## 2022-02-15 ENCOUNTER — Other Ambulatory Visit: Payer: Self-pay

## 2022-02-15 ENCOUNTER — Ambulatory Visit: Payer: Medicare (Managed Care) | Admitting: Infectious Diseases

## 2022-02-15 ENCOUNTER — Ambulatory Visit (INDEPENDENT_AMBULATORY_CARE_PROVIDER_SITE_OTHER): Payer: 59 | Admitting: Infectious Diseases

## 2022-02-15 VITALS — BP 137/89 | HR 86 | Resp 16 | Ht 62.0 in | Wt 129.0 lb

## 2022-02-15 DIAGNOSIS — I10 Essential (primary) hypertension: Secondary | ICD-10-CM

## 2022-02-15 DIAGNOSIS — M545 Low back pain, unspecified: Secondary | ICD-10-CM

## 2022-02-15 DIAGNOSIS — Z113 Encounter for screening for infections with a predominantly sexual mode of transmission: Secondary | ICD-10-CM | POA: Diagnosis not present

## 2022-02-15 DIAGNOSIS — Z79899 Other long term (current) drug therapy: Secondary | ICD-10-CM

## 2022-02-15 DIAGNOSIS — G8929 Other chronic pain: Secondary | ICD-10-CM

## 2022-02-15 DIAGNOSIS — I2510 Atherosclerotic heart disease of native coronary artery without angina pectoris: Secondary | ICD-10-CM | POA: Diagnosis not present

## 2022-02-15 DIAGNOSIS — F17200 Nicotine dependence, unspecified, uncomplicated: Secondary | ICD-10-CM | POA: Diagnosis not present

## 2022-02-15 DIAGNOSIS — R87622 Low grade squamous intraepithelial lesion on cytologic smear of vagina (LGSIL): Secondary | ICD-10-CM

## 2022-02-15 DIAGNOSIS — B20 Human immunodeficiency virus [HIV] disease: Secondary | ICD-10-CM

## 2022-02-15 MED ORDER — PREDNISONE 10 MG (21) PO TBPK: ORAL_TABLET | ORAL | 0 refills | Status: DC

## 2022-02-15 NOTE — Assessment & Plan Note (Signed)
Will send her to PT to see if we can improve.  ?

## 2022-02-15 NOTE — Addendum Note (Signed)
Addended by: Theresia Majors A on: 02/15/2022 10:03 AM ? ? Modules accepted: Orders ? ?

## 2022-02-15 NOTE — Assessment & Plan Note (Signed)
She is doing well ?Continue on biktarvy. ?Will check her CD4 and HIV RNA today ?Will send her for mammo ?She defers pap.  ?Will see her back in 6 months.  ?

## 2022-02-15 NOTE — Assessment & Plan Note (Signed)
bp moderately well controlled today, appreciate CV f/u.  ?She is asx.  ?

## 2022-02-15 NOTE — Addendum Note (Signed)
Addended by: Wanda Cellucci C on: 02/15/2022 10:22 AM ? ? Modules accepted: Orders ? ?

## 2022-02-15 NOTE — Assessment & Plan Note (Signed)
She has AVA 1.8 and EF 70-75% with severe concentrict LVH, grade 1 diastolic dysfxn on last echo (2022).  ?I appreciate CV f/u. She is asx.  ?She also has PPM.  ?

## 2022-02-15 NOTE — Assessment & Plan Note (Signed)
Encouraged her to quit smoking.  ?

## 2022-02-15 NOTE — Progress Notes (Signed)
? ?Subjective:  ? ? Patient ID: Nicole Mccormick, female  DOB: [birth year], 76 y.o.        MRN: [id number] ? ? ?HPI ?76 yo F with hx of HTN and HIV+. She is also long term smoker.  ?She had episodes of bradycardia, and had pacer placed 02-05-18.  ?Was changed to biktarvy 06-2018 from tivicay-descovey.  ?Has still been having joint pains. Mostly in her lower back. ?05-2021 ?1. Right convex scoliosis. ?2. Spondylosis and facet hypertrophy at L4-5 and L5-S1. ?  ?Has been eating more, ensure makes her hungry.  ?Wt steady.  ? ?Now living in single story dwelling.  ?  ?She was in hospital June 2021 with anemia- she had colonoscopy (4-5 polyps, hemmeroids) and EGD (avm).  ?She still has GI f/u as well as Heme f/u for iron infusions.  ?  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 ?We reviewed her vaccines.  ? ? ?HIV 1 RNA Quant  ?Date Value  ?08/17/2021 Not Detected Copies/mL  ?08/23/2020 <20 DETECTED copies/mL  ?07/06/2018 <20 NOT DETECTED copies/mL  ? ?CD4 T Cell Abs (/uL)  ?Date Value  ?08/23/2020 395 (L)  ?06/05/2020 389 (L)  ?07/06/2018 400  ? ? ? ?Health Maintenance  ?Topic Date Due  ?? Zoster Vaccines- Shingrix (1 of 2) Never done  ?? DEXA SCAN  Never done  ?? COVID-19 Vaccine (4 - Booster for Pfizer series) 10/05/2020  ?? TETANUS/TDAP  02/27/2023  ?? COLONOSCOPY (Pts 45-23yr Insurance coverage will need to be confirmed)  06/06/2027  ?? Pneumonia Vaccine 78 Years old  Completed  ?? INFLUENZA VACCINE  Completed  ?? Hepatitis C Screening  Completed  ?? HPV VACCINES  Aged Out  ? ? ? ? ?Review of Systems  ?Constitutional:  Negative for chills, fever and weight loss.  ?Respiratory:  Negative for cough and wheezing.   ?Cardiovascular:  Negative for chest pain.  ?Gastrointestinal:  Negative for constipation and diarrhea.  ?Genitourinary:  Negative for dysuria.  ?Musculoskeletal:  Positive for back pain.  ? ?Please see HPI. All other systems reviewed and negative. ? ?   ?Objective:  ?Physical Exam ?Vitals reviewed.   ?Constitutional:   ?   General: She is not in acute distress. ?   Appearance: Normal appearance. She is not toxic-appearing.  ?HENT:  ?   Mouth/Throat:  ?   Mouth: Mucous membranes are moist.  ?   Pharynx: No oropharyngeal exudate.  ?Eyes:  ?   Extraocular Movements: Extraocular movements intact.  ?   Pupils: Pupils are equal, round, and reactive to light.  ?Cardiovascular:  ?   Rate and Rhythm: Normal rate and regular rhythm.  ?   Heart sounds: Murmur heard.  ?Pulmonary:  ?   Effort: Pulmonary effort is normal.  ?   Breath sounds: Normal breath sounds.  ?Abdominal:  ?   General: Bowel sounds are normal. There is no distension.  ?   Palpations: Abdomen is soft.  ?   Tenderness: There is no abdominal tenderness.  ?Musculoskeletal:     ?   General: Normal range of motion.  ?   Cervical back: Normal range of motion and neck supple.  ?   Right lower leg: No edema.  ?   Left lower leg: No edema.  ?Neurological:  ?   General: No focal deficit present.  ?   Mental Status: She is alert.  ?Psychiatric:     ?   Mood and Affect: Mood normal.  ? ? ? ? ? ?   ?  Assessment & Plan:  ? ?

## 2022-02-15 NOTE — Assessment & Plan Note (Signed)
Will send her to GYN ?

## 2022-02-18 LAB — CBC
HCT: 43.4 % (ref 35.0–45.0)
Hemoglobin: 14.3 g/dL (ref 11.7–15.5)
MCH: 34.8 pg — ABNORMAL HIGH (ref 27.0–33.0)
MCHC: 32.9 g/dL (ref 32.0–36.0)
MCV: 105.6 fL — ABNORMAL HIGH (ref 80.0–100.0)
MPV: 10.2 fL (ref 7.5–12.5)
Platelets: 204 10*3/uL (ref 140–400)
RBC: 4.11 10*6/uL (ref 3.80–5.10)
RDW: 12.8 % (ref 11.0–15.0)
WBC: 4.9 10*3/uL (ref 3.8–10.8)

## 2022-02-18 LAB — COMPREHENSIVE METABOLIC PANEL
AG Ratio: 1.5 (calc) (ref 1.0–2.5)
ALT: 11 U/L (ref 6–29)
AST: 15 U/L (ref 10–35)
Albumin: 4.4 g/dL (ref 3.6–5.1)
Alkaline phosphatase (APISO): 65 U/L (ref 37–153)
BUN: 24 mg/dL (ref 7–25)
CO2: 33 mmol/L — ABNORMAL HIGH (ref 20–32)
Calcium: 9.9 mg/dL (ref 8.6–10.4)
Chloride: 103 mmol/L (ref 98–110)
Creat: 0.81 mg/dL (ref 0.60–1.00)
Globulin: 3 g/dL (calc) (ref 1.9–3.7)
Glucose, Bld: 88 mg/dL (ref 65–99)
Potassium: 4.4 mmol/L (ref 3.5–5.3)
Sodium: 141 mmol/L (ref 135–146)
Total Bilirubin: 0.5 mg/dL (ref 0.2–1.2)
Total Protein: 7.4 g/dL (ref 6.1–8.1)

## 2022-02-18 LAB — T-HELPER CELLS (CD4) COUNT (NOT AT ARMC)
Absolute CD4: 466 cells/uL — ABNORMAL LOW (ref 490–1740)
CD4 T Helper %: 29 % — ABNORMAL LOW (ref 30–61)
Total lymphocyte count: 1633 cells/uL (ref 850–3900)

## 2022-02-18 LAB — LIPID PANEL
Cholesterol: 167 mg/dL (ref <200)
HDL: 98 mg/dL (ref >=50)
LDL Cholesterol (Calc): 54 mg/dL (calc)
Non-HDL Cholesterol (Calc): 69 mg/dL (calc) (ref <130)
Total CHOL/HDL Ratio: 1.7 (calc) (ref <5.0)
Triglycerides: 66 mg/dL (ref <150)

## 2022-02-18 LAB — C. TRACHOMATIS/N. GONORRHOEAE RNA
C. trachomatis RNA, TMA: NOT DETECTED
N. gonorrhoeae RNA, TMA: NOT DETECTED

## 2022-02-18 LAB — RPR: RPR Ser Ql: NONREACTIVE

## 2022-02-18 NOTE — Progress Notes (Unsigned)
Electrophysiology Office Note Date: 02/18/2022  ID:  Nicole Mccormick, DOB August 30, 1946, MRN [id number]  PCP: Sonia Side., FNP Primary Cardiologist: None Electrophysiologist: Thompson Grayer, MD   CC: Pacemaker follow-up  Nicole Mccormick is a 76 y.o. female seen today for Thompson Grayer, MD for routine electrophysiology followup.  Since last being seen in our clinic the patient reports doing ***.  she denies chest pain, palpitations, dyspnea, PND, orthopnea, nausea, vomiting, dizziness, syncope, edema, weight gain, or early satiety.  Device History: St. Jude Dual Chamber PPM implanted 01/2018 for symptomatic bradycardia  Past Medical History:  Diagnosis Date   Abnormal Pap smear    Aortic stenosis    TTE 2021 with mean gradient 54mHg   Arthritis    Asthma    AVM (arteriovenous malformation)    Bradycardia    Complication of anesthesia    "they have a hard time bringing me back" (11/08/2015)   Coronary artery disease    High grade RCA disease; diagnosed during acute GIB; on medical therapy   Dynamic left ventricular outflow obstruction    GERD (gastroesophageal reflux disease)    previously on aciphex, discontinued november 2012  because patient asymptomatic, and concern about interference with HIV meds.  May try pepcid in the future if symptoms return   Heart murmur    Hepatitis C antibody test positive    HIV infection ([place]    diagnosed before 2008   Hyperlipidemia 09/12/2021   Hypertension    Hypertensive heart disease    Iron deficiency anemia    Ferritin = 2 in november 2012, started on iron supplemenation   Pacemaker    a. St Jude PPM 01/2018.   Seizures ([place]    Symptomatic anemia 08/24/2020   Varicosities    Past Surgical History:  Procedure Laterality Date   ABDOMINAL HYSTERECTOMY     CARDIAC CATHETERIZATION N/A 11/08/2015   Procedure: Left Heart Cath and Coronary Angiography;  Surgeon: JJettie Booze MD;  [place];  Service: Cardiovascular;   Laterality: N/A;   COLONOSCOPY  10/13/11   small adenoma, anal condyloma   COLONOSCOPY  10/13/2011   Procedure: COLONOSCOPY;  Surgeon: CGatha Mayer MD;  [place]  Service: Endoscopy;  Laterality: N/A;   COLONOSCOPY N/A 09/14/2014   Procedure: COLONOSCOPY;  Surgeon: WArta Silence MD;  [facility];  Service: Endoscopy;  Laterality: N/A;   COLONOSCOPY WITH PROPOFOL N/A 06/05/2020   Procedure: COLONOSCOPY WITH PROPOFOL;  Surgeon: OArta Silence MD;  [place]  Service: Endoscopy;  Laterality: N/A;   ENTEROSCOPY N/A 10/31/2020   Procedure: ENTEROSCOPY;  Surgeon: BOtis Brace MD;  [facility];  Service: Gastroenterology;  Laterality: N/A;   ESOPHAGOGASTRODUODENOSCOPY  10/13/11   small hiatal hernia   ESOPHAGOGASTRODUODENOSCOPY  10/13/2011   Procedure: ESOPHAGOGASTRODUODENOSCOPY (EGD);  Surgeon: CGatha Mayer MD;  [facility];  Service: Endoscopy;  Laterality: N/A;   ESOPHAGOGASTRODUODENOSCOPY N/A 09/14/2014   Procedure: ESOPHAGOGASTRODUODENOSCOPY (EGD);  Surgeon: WArta Silence MD;  [facility]  Service: Endoscopy;  Laterality: N/A;   ESOPHAGOGASTRODUODENOSCOPY (EGD) WITH PROPOFOL N/A 06/05/2020   Procedure: ESOPHAGOGASTRODUODENOSCOPY (EGD) WITH PROPOFOL;  Surgeon: OArta Silence MD;  [place]  Service: Endoscopy;  Laterality: N/A;   GIVENS CAPSULE STUDY N/A 09/14/2014   Procedure: GIVENS CAPSULE STUDY;  Surgeon: WArta Silence MD;  [facility]  Service: Endoscopy;  Laterality: N/A;   GIVENS CAPSULE STUDY N/A 08/26/2020   Procedure: GIVENS CAPSULE STUDY;  Surgeon: GAnson Fret  F, MD;  Location: [place] [facility]OSCOPY;  Service: Endoscopy;  Laterality: N/A;   HOT HEMOSTASIS N/A 10/31/2020   Procedure: HOT HEMOSTASIS (ARGON PLASMA COAGULATION/BICAP);  Surgeon: Otis Brace, MD;  [facility];  Service: Gastroenterology;  Laterality: N/A;   PACEMAKER IMPLANT N/A 02/05/2018   Procedure:  PACEMAKER IMPLANT;  Surgeon: Thompson Grayer, MD;  Location: [place] CV LAB;  Service: Cardiovascular;  Laterality: N/A;   POLYPECTOMY  06/05/2020   Procedure: POLYPECTOMY;  Surgeon: Arta Silence, MD;  Location: [place] ENDOSCOPY;  Service: Endoscopy;;    Current Outpatient Medications  Medication Sig Dispense Refill   acyclovir (ZOVIRAX) 400 MG tablet Take 1 tablet (400 mg total) by mouth daily as needed (for flare ups). 30 tablet 3   albuterol (PROAIR HFA) 108 (90 Base) MCG/ACT inhaler INHALE 2 PUFFS BY MOUTH EVERY 4 HOURS IF NEEDED FOR WHEEZING OR SHORTNESS OF BREATH (Patient taking differently: Inhale 2 puffs into the lungs every 4 (four) hours as needed for wheezing or shortness of breath.) 8.5 g 0   amLODipine (NORVASC) 10 MG tablet TAKE 1 TABLET BY MOUTH EVERY DAY (Patient taking differently: Take 10 mg by mouth daily.) 90 tablet 0   Ascorbic Acid (VITAMIN C) 1000 MG tablet Take 1,000 mg by mouth daily.     atorvastatin (LIPITOR) 40 MG tablet take 1 tablet by mouth once daily AT 6 PM (Patient taking differently: Take 40 mg by mouth every evening.) 90 tablet 0   BIKTARVY 50-200-25 MG TABS tablet TAKE 1 TABLET BY MOUTH EVERY DAY (Patient taking differently: Take 1 tablet by mouth daily.) 90 tablet 3   carvedilol (COREG) 3.125 MG tablet TAKE 1 TABLET(3.125 MG) BY MOUTH TWICE DAILY (Patient taking differently: Take 3.125 mg by mouth 2 (two) times daily with a meal.) 180 tablet 3   cholecalciferol (VITAMIN D3) 25 MCG (1000 UNIT) tablet Take 1,000 Units by mouth daily.     dextromethorphan (DELSYM) 30 MG/5ML liquid Take 30 mg by mouth as needed for cough.      diclofenac (VOLTAREN) 75 MG EC tablet Take 1 tablet (75 mg total) by mouth 2 (two) times daily as needed. Do not take until finished with steroid taper 60 tablet 2   Ensure (ENSURE) Take 237 mLs by mouth 2 (two) times daily between meals. 5688 mL 5   escitalopram (LEXAPRO) 10 MG tablet Take 10 mg by mouth daily as needed (anxiety).      famotidine (PEPCID) 20 MG tablet Take 20 mg by mouth 2 (two) times daily.     ferrous sulfate 325 (65 FE) MG EC tablet Take 1 tablet (325 mg total) by mouth in the morning and at bedtime. 180 tablet 0   Flaxseed, Linseed, (FLAX SEED OIL) 1000 MG CAPS Take 1,000 mg by mouth daily.     folic acid (FOLVITE) 1 MG tablet TAKE 1 TABLET BY MOUTH EVERY DAY 90 tablet 1   gabapentin (NEURONTIN) 100 MG capsule Take 100 mg by mouth 3 (three) times daily.     gabapentin (NEURONTIN) 300 MG capsule      hydrochlorothiazide (HYDRODIURIL) 25 MG tablet Take 25 mg by mouth daily.      HYDROcodone-acetaminophen (NORCO/VICODIN) 5-325 MG tablet Take 1 tablet by mouth 2 (two) times daily as needed.     isosorbide mononitrate (IMDUR) 60 MG 24 hr tablet Take 1 tablet (60 mg total) by mouth daily. 90 tablet 1   lisinopril (ZESTRIL) 40 MG tablet Take 40 mg by mouth daily.     loratadine (  CLARITIN) 10 MG tablet 1 tablet     meclizine (ANTIVERT) 25 MG tablet Take 25 mg by mouth as needed for dizziness.      methocarbamol (ROBAXIN) 500 MG tablet Take 1 tablet (500 mg total) by mouth 2 (two) times daily as needed. 20 tablet 1   Omega-3 Fatty Acids (FISH OIL) 1000 MG CPDR Take 1,000 mg by mouth daily.     ondansetron (ZOFRAN-ODT) 4 MG disintegrating tablet 1 tablet on the tongue and allow to dissolve     pantoprazole (PROTONIX) 40 MG tablet Take 1 tablet (40 mg total) by mouth daily. 90 tablet 0   predniSONE (STERAPRED UNI-PAK 21 TAB) 10 MG (21) TBPK tablet Take as directed 21 tablet 0   spironolactone (ALDACTONE) 25 MG tablet TAKE 1 TABLET BY MOUTH EVERY DAY (Patient taking differently: Take 25 mg by mouth daily.) 30 tablet 0   SYMBICORT 160-4.5 MCG/ACT inhaler Inhale 2 puffs into the lungs 2 (two) times daily.     vitamin A 10000 UNIT capsule Take 10,000 Units by mouth daily.     vitamin B-12 (CYANOCOBALAMIN) 1000 MCG tablet TAKE 1 TABLET BY MOUTH EVERY DAY 90 tablet 1   No current facility-administered medications for this  visit.    Allergies:   Penicillins, Penicillin g, and Avelox [moxifloxacin hcl in nacl]   Social History: Social History   Socioeconomic History   Marital status: Married    Spouse name: Not on file   Number of children: 6   Years of education: Not on file   Highest education level: Not on file  Occupational History   Occupation: retired  Tobacco Use   Smoking status: Every Day    Packs/day: 1.00    Years: 60.00    Pack years: 60.00    Types: Cigarettes   Smokeless tobacco: Never   Tobacco comments:    Wants to quit. Using both the patch and gum.  Vaping Use   Vaping Use: Never used  Substance and Sexual Activity   Alcohol use: Yes    Alcohol/week: 10.0 standard drinks    Types: 10 Glasses of wine per week    Comment: drinking less recently, denies h/o heavy use   Drug use: No   Sexual activity: Yes    Partners: Male    Birth control/protection: Surgical    Comment: pt. given condoms 08/23/20  Other Topics Concern   Not on file  Social History Narrative   Not on file   Social Determinants of Health   Financial Resource Strain: Not on file  Food Insecurity: Not on file  Transportation Needs: Not on file  Physical Activity: Not on file  Stress: Not on file  Social Connections: Not on file  Intimate Partner Violence: Not on file    Family History: Family History  Problem Relation Age of Onset   Diabetes Mother    Ovarian cancer Sister    Colon cancer Sister      Review of Systems: All other systems reviewed and are otherwise negative except as noted above.  Physical Exam: There were no vitals filed for this visit.   GEN- The patient is well appearing, alert and oriented x 3 today.   HEENT: normocephalic, atraumatic; sclera clear, conjunctiva pink; hearing intact; oropharynx clear; neck supple  Lungs- Clear to ausculation bilaterally, normal work of breathing.  No wheezes, rales, rhonchi Heart- Regular rate and rhythm, no murmurs, rubs or gallops   GI- soft, non-tender, non-distended, bowel sounds present  Extremities- no  clubbing or cyanosis. No edema MS- no significant deformity or atrophy Skin- warm and dry, no rash or lesion; PPM pocket well healed Psych- euthymic mood, full affect Neuro- strength and sensation are intact  PPM Interrogation- reviewed in detail today,  See PACEART report  EKG:  EKG is ordered today. Personal review of ekg ordered today shows ***   Recent Labs: 02/15/2022: ALT 11; BUN 24; Creat 0.81; Hemoglobin 14.3; Platelets 204; Potassium 4.4; Sodium 141   Wt Readings from Last 3 Encounters:  02/15/22 129 lb (58.5 kg)  02/06/22 128 lb 11.2 oz (58.4 kg)  08/17/21 128 lb (58.1 kg)     Other studies Reviewed: Additional studies/ records that were reviewed today include: Previous EP office notes, Previous remote checks, Most recent labwork.   Assessment and Plan:  1. Symptomatic bradycardia s/p St. Jude PPM  Normal PPM function See Pace Art report No changes today  2. HTN Stable on current regimen   Current medicines are reviewed at length with the patient today.    Labs/ tests ordered today include: *** No orders of the defined types were placed in this encounter.    Disposition:   Follow up with {Blank single:19197::"Dr. Allred","Dr. Arlan Organ. Klein","Dr. Camnitz","Dr. Lambert","EP APP"} in {Blank single:19197::"2 weeks","4 weeks","3 months","6 months","12 months","as usual post gen change"}    Signed, Annamaria Helling  02/18/2022 8:17 AM  [facility] [address] [place] [address] [phone] (office) [phone] (fax)

## 2022-02-20 ENCOUNTER — Telehealth (HOSPITAL_COMMUNITY): Payer: Self-pay | Admitting: Radiology

## 2022-02-20 ENCOUNTER — Encounter: Payer: Self-pay | Admitting: Student

## 2022-02-20 ENCOUNTER — Ambulatory Visit (INDEPENDENT_AMBULATORY_CARE_PROVIDER_SITE_OTHER): Payer: 59

## 2022-02-20 ENCOUNTER — Ambulatory Visit (INDEPENDENT_AMBULATORY_CARE_PROVIDER_SITE_OTHER): Payer: 59 | Admitting: Student

## 2022-02-20 ENCOUNTER — Other Ambulatory Visit: Payer: Self-pay

## 2022-02-20 VITALS — BP 130/82 | HR 64 | Ht 62.0 in | Wt 129.8 lb

## 2022-02-20 DIAGNOSIS — R0782 Intercostal pain: Secondary | ICD-10-CM | POA: Diagnosis not present

## 2022-02-20 DIAGNOSIS — I495 Sick sinus syndrome: Secondary | ICD-10-CM

## 2022-02-20 DIAGNOSIS — I25118 Atherosclerotic heart disease of native coronary artery with other forms of angina pectoris: Secondary | ICD-10-CM

## 2022-02-20 DIAGNOSIS — R001 Bradycardia, unspecified: Secondary | ICD-10-CM

## 2022-02-20 DIAGNOSIS — I10 Essential (primary) hypertension: Secondary | ICD-10-CM

## 2022-02-20 LAB — CUP PACEART REMOTE DEVICE CHECK
Battery Remaining Longevity: 73 mo
Battery Remaining Percentage: 63 %
Battery Voltage: 2.99 V
Brady Statistic AP VP Percent: 1.4 %
Brady Statistic AP VS Percent: 94 %
Brady Statistic AS VP Percent: 1 %
Brady Statistic AS VS Percent: 4.6 %
Brady Statistic RA Percent Paced: 94 %
Brady Statistic RV Percent Paced: 1.5 %
Date Time Interrogation Session: 20230315020020
Implantable Lead Implant Date: 20190228
Implantable Lead Implant Date: 20190228
Implantable Lead Location: 753859
Implantable Lead Location: 753860
Implantable Pulse Generator Implant Date: 20190228
Lead Channel Impedance Value: 350 Ohm
Lead Channel Impedance Value: 490 Ohm
Lead Channel Pacing Threshold Amplitude: 0.5 V
Lead Channel Pacing Threshold Amplitude: 0.625 V
Lead Channel Pacing Threshold Pulse Width: 0.5 ms
Lead Channel Pacing Threshold Pulse Width: 0.5 ms
Lead Channel Sensing Intrinsic Amplitude: 11 mV
Lead Channel Sensing Intrinsic Amplitude: 4 mV
Lead Channel Setting Pacing Amplitude: 0.875
Lead Channel Setting Pacing Amplitude: 1.5 V
Lead Channel Setting Pacing Pulse Width: 0.5 ms
Lead Channel Setting Sensing Sensitivity: 2 mV
Pulse Gen Model: 2272
Pulse Gen Serial Number: 8996997

## 2022-02-20 LAB — CUP PACEART INCLINIC DEVICE CHECK
Battery Remaining Longevity: 73 mo
Battery Voltage: 2.99 V
Brady Statistic RA Percent Paced: 94 %
Brady Statistic RV Percent Paced: 1.4 %
Date Time Interrogation Session: 20230315091910
Implantable Lead Implant Date: 20190228
Implantable Lead Implant Date: 20190228
Implantable Lead Location: 753859
Implantable Lead Location: 753860
Implantable Pulse Generator Implant Date: 20190228
Lead Channel Impedance Value: 362.5 Ohm
Lead Channel Impedance Value: 562.5 Ohm
Lead Channel Pacing Threshold Amplitude: 0.5 V
Lead Channel Pacing Threshold Amplitude: 0.625 V
Lead Channel Pacing Threshold Pulse Width: 0.5 ms
Lead Channel Pacing Threshold Pulse Width: 0.5 ms
Lead Channel Sensing Intrinsic Amplitude: 12 mV
Lead Channel Sensing Intrinsic Amplitude: 4.5 mV
Lead Channel Setting Pacing Amplitude: 0.875
Lead Channel Setting Pacing Amplitude: 1.5 V
Lead Channel Setting Pacing Pulse Width: 0.5 ms
Lead Channel Setting Sensing Sensitivity: 2 mV
Pulse Gen Model: 2272
Pulse Gen Serial Number: 8996997

## 2022-02-20 LAB — BASIC METABOLIC PANEL
BUN/Creatinine Ratio: 23 (ref 12–28)
BUN: 19 mg/dL (ref 8–27)
CO2: 26 mmol/L (ref 20–29)
Calcium: 9.4 mg/dL (ref 8.7–10.3)
Chloride: 101 mmol/L (ref 96–106)
Creatinine, Ser: 0.84 mg/dL (ref 0.57–1.00)
Glucose: 119 mg/dL — ABNORMAL HIGH (ref 70–99)
Potassium: 4 mmol/L (ref 3.5–5.2)
Sodium: 138 mmol/L (ref 134–144)
eGFR: 72 mL/min/{1.73_m2} (ref >59)

## 2022-02-20 LAB — CBC
Hematocrit: 40.1 % (ref 34.0–46.6)
Hemoglobin: 13.6 g/dL (ref 11.1–15.9)
MCH: 34.9 pg — ABNORMAL HIGH (ref 26.6–33.0)
MCHC: 33.9 g/dL (ref 31.5–35.7)
MCV: 103 fL — ABNORMAL HIGH (ref 79–97)
Platelets: 191 10*3/uL (ref 150–450)
RBC: 3.9 x10E6/uL (ref 3.77–5.28)
RDW: 12.9 % (ref 11.7–15.4)
WBC: 4.6 10*3/uL (ref 3.4–10.8)

## 2022-02-20 MED ORDER — METOPROLOL SUCCINATE ER 50 MG PO TB24: 50.0000 mg | ORAL_TABLET | Freq: Every day | ORAL | 3 refills | Status: DC

## 2022-02-20 NOTE — Telephone Encounter (Signed)
Patient given detailed instructions per Myocardial Perfusion Study Information Sheet for the test on 02/22/2022 at 7:30 . Patient notified to arrive 15 minutes early and that it is imperative to arrive on time for appointment to keep from having the test rescheduled. ? If you need to cancel or reschedule your appointment, please call the office within 24 hours of your appointment. . Patient verbalized understanding.EHK ? ?

## 2022-02-20 NOTE — Patient Instructions (Addendum)
Medication Instructions:  ? ?STOP TAKING CARVEDILOL ( COREG)   ? ?START TAKING  TOPROL XL  50 MG ONCE A DAY AT BEDTIME ? ?*If you need a refill on your cardiac medications before your next appointment, please call your pharmacy* ? ? ?Lab Work: BMET AND CBC TODAY  ? ?If you have labs (blood work) drawn today and your tests are completely normal, you will receive your results only by: ?MyChart Message (if you have MyChart) OR ?A paper copy in the mail ?If you have any lab test that is abnormal or we need to change your treatment, we will call you to review the results. ? ? ? ?Testing/Procedures: Your physician has requested that you have a lexiscan myoview. For further information please visit [email or web address]. Please follow instruction sheet, as given. ? ?  ?Follow-Up: ?At [facility], you and your health needs are our priority.  As part of our continuing mission to provide you with exceptional heart care, we have created designated Provider Care Teams.  These Care Teams include your primary Cardiologist (physician) and Advanced Practice Providers (APPs -  Physician Assistants and Nurse Practitioners) who all work together to provide you with the care you need, when you need it. ? ?We recommend signing up for the patient portal called "MyChart".  Sign up information is provided on this After Visit Summary.  MyChart is used to connect with patients for Virtual Visits (Telemedicine).  Patients are able to view lab/test results, encounter notes, upcoming appointments, etc.  Non-urgent messages can be sent to your provider as well.   ?To learn more about what you can do with MyChart, go to [email or web address].   ? ?Your next appointment:   ?6 month(s) ? ?The format for your next appointment:   ?In Person ? ?Provider:   ?Legrand Como "Jonni Sanger" [place], PA-C  ? ? ?Other Instructions ? ?

## 2022-02-22 ENCOUNTER — Ambulatory Visit (HOSPITAL_COMMUNITY): Payer: 59

## 2022-02-22 ENCOUNTER — Other Ambulatory Visit: Payer: Self-pay

## 2022-02-22 ENCOUNTER — Ambulatory Visit (HOSPITAL_COMMUNITY): Payer: 59 | Attending: Cardiology

## 2022-02-22 DIAGNOSIS — R0782 Intercostal pain: Secondary | ICD-10-CM | POA: Diagnosis present

## 2022-02-22 LAB — MYOCARDIAL PERFUSION IMAGING
Base ST Depression (mm): 0 mm
LV dias vol: 71 mL (ref 46–106)
LV sys vol: 28 mL
Nuc Stress EF: 60 %
Peak HR: 67 {beats}/min
Rest HR: 60 {beats}/min
Rest Nuclear Isotope Dose: 10.7 mCi
SDS: 0
SRS: 3
SSS: 3
ST Depression (mm): 0 mm
Stress Nuclear Isotope Dose: 31.9 mCi
TID: 1.01

## 2022-02-22 MED ORDER — REGADENOSON 0.4 MG/5ML IV SOLN
0.4000 mg | Freq: Once | INTRAVENOUS | Status: AC
  Administered 2022-02-22: 0.4 mg via INTRAVENOUS

## 2022-02-22 MED ORDER — TECHNETIUM TC 99M TETROFOSMIN IV KIT
31.9000 | PACK | Freq: Once | INTRAVENOUS | Status: AC | PRN
  Administered 2022-02-22: 31.9 via INTRAVENOUS
  Filled 2022-02-22: qty 32

## 2022-02-22 MED ORDER — TECHNETIUM TC 99M TETROFOSMIN IV KIT
10.7000 | PACK | Freq: Once | INTRAVENOUS | Status: AC | PRN
  Administered 2022-02-22: 10.7 via INTRAVENOUS
  Filled 2022-02-22: qty 11

## 2022-02-27 ENCOUNTER — Ambulatory Visit: Payer: 59 | Attending: Infectious Diseases

## 2022-02-27 NOTE — Therapy (Incomplete)
?OUTPATIENT PHYSICAL THERAPY THORACOLUMBAR EVALUATION ? ? ?Patient Name: Nicole Mccormick ?MRN: [id number] ?DOB:[birth year], 76 y.o., female ?Today's Date: 02/27/2022 ? ? ? ?Past Medical History:  ?Diagnosis Date  ? Abnormal Pap smear   ? Aortic stenosis   ? TTE 2021 with mean gradient 71mHg  ? Arthritis   ? Asthma   ? AVM (arteriovenous malformation)   ? Bradycardia   ? Complication of anesthesia   ? "they have a hard time bringing me back" (11/08/2015)  ? Coronary artery disease   ? High grade RCA disease; diagnosed during acute GIB; on medical therapy  ? Dynamic left ventricular outflow obstruction   ? GERD (gastroesophageal reflux disease)   ? previously on aciphex, discontinued november 2012  because patient asymptomatic, and concern about interference with HIV meds.  May try pepcid in the future if symptoms return  ? Heart murmur   ? Hepatitis C antibody test positive   ? HIV infection ([place]   ? diagnosed before 2008  ? Hyperlipidemia 09/12/2021  ? Hypertension   ? Hypertensive heart disease   ? Iron deficiency anemia   ? Ferritin = 2 in november 2012, started on iron supplemenation  ? Pacemaker   ? a. St Jude PPM 01/2018.  ? Seizures ([place]   ? Symptomatic anemia 08/24/2020  ? Varicosities   ? ?Past Surgical History:  ?Procedure Laterality Date  ? ABDOMINAL HYSTERECTOMY    ? CARDIAC CATHETERIZATION N/A 11/08/2015  ? Procedure: Left Heart Cath and Coronary Angiography;  Surgeon: JJettie Booze MD;  [place];  Service: Cardiovascular;  Laterality: N/A;  ? COLONOSCOPY  10/13/11  ? small adenoma, anal condyloma  ? COLONOSCOPY  10/13/2011  ? Procedure: COLONOSCOPY;  Surgeon: CGatha Mayer MD;  [place]  Service: Endoscopy;  Laterality: N/A;  ? COLONOSCOPY N/A 09/14/2014  ? Procedure: COLONOSCOPY;  Surgeon: WArta Silence MD;  [facility];  Service: Endoscopy;  Laterality: N/A;  ? COLONOSCOPY WITH PROPOFOL N/A 06/05/2020  ? Procedure: COLONOSCOPY WITH PROPOFOL;  Surgeon:  OArta Silence MD;  [place]  Service: Endoscopy;  Laterality: N/A;  ? ENTEROSCOPY N/A 10/31/2020  ? Procedure: ENTEROSCOPY;  Surgeon: BOtis Brace MD;  [place] [facility]  Service: Gastroenterology;  Laterality: N/A;  ? ESOPHAGOGASTRODUODENOSCOPY  10/13/11  ? small hiatal hernia  ? ESOPHAGOGASTRODUODENOSCOPY  10/13/2011  ? Procedure: ESOPHAGOGASTRODUODENOSCOPY (EGD);  Surgeon: CGatha Mayer MD;  [facility];  Service: Endoscopy;  Laterality: N/A;  ? ESOPHAGOGASTRODUODENOSCOPY N/A 09/14/2014  ? Procedure: ESOPHAGOGASTRODUODENOSCOPY (EGD);  Surgeon: WArta Silence MD;  [facility]  Service: Endoscopy;  Laterality: N/A;  ? ESOPHAGOGASTRODUODENOSCOPY (EGD) WITH PROPOFOL N/A 06/05/2020  ? Procedure: ESOPHAGOGASTRODUODENOSCOPY (EGD) WITH PROPOFOL;  Surgeon: OArta Silence MD;  [facility];  Service: Endoscopy;  Laterality: N/A;  ? GIVENS CAPSULE STUDY N/A 09/14/2014  ? Procedure: GIVENS CAPSULE STUDY;  Surgeon: WArta Silence MD;  [facility];  Service: Endoscopy;  Laterality: N/A;  ? GIVENS CAPSULE STUDY N/A 08/26/2020  ? Procedure: GIVENS CAPSULE STUDY;  Surgeon: GWonda Horner MD;  [facility];  Service: Endoscopy;  Laterality: N/A;  ? HOT HEMOSTASIS N/A 10/31/2020  ? Procedure: HOT HEMOSTASIS (ARGON PLASMA COAGULATION/BICAP);  Surgeon: BOtis Brace MD;  [facility];  Service: Gastroenterology;  Laterality: N/A;  ? PACEMAKER IMPLANT N/A 02/05/2018  ? Procedure: PACEMAKER IMPLANT;  Surgeon: AThompson Grayer MD;  [place];  Service: Cardiovascular;  Laterality: N/A;  ? POLYPECTOMY  06/05/2020  ?  Procedure: POLYPECTOMY;  Surgeon: Arta Silence, MD;  Location[facility] END[facility]COPY;  Service: Endoscopy;;  ? ?Patient Active Problem List  ? Diagnosis Date Noted  ? Hyperlipidemia 09/12/2021  ? Pacemaker 09/12/2021  ? Aortic stenosis 08/17/2021  ? Knee pain, right 08/17/2021  ? Iron deficiency anemia due to chronic  blood loss 01/03/2021  ? Unspecified severe protein-calorie malnutrition ([place]) 11/14/2020  ? Acute blood loss anemia 10/30/2020  ? AVM (arteriovenous malformation) of stomach, acquired 08/26/2020  ? COPD (chronic obstructive pulmonary disease) ([place]) 08/24/2020  ? Arthritis 08/23/2020  ? Dyspnea   ? Shortness of breath   ? Severe anemia 06/03/2020  ? Osteoarthritis of wrist 04/28/2019  ? Osteoarthritis of glenohumeral joint 04/28/2019  ? Chronic arthralgias of knees and hips 07/06/2018  ? Sick sinus syndrome ([place]) 02/28/2016  ? Hypertensive cardiovascular disease 02/28/2016  ? Bilateral carotid bruits 02/12/2016  ? LVH (left ventricular hypertrophy) 01/15/2016  ? GI bleed 11/07/2015  ? Anxiety 07/22/2015  ? Seasonal allergies 07/22/2015  ? Asthma, chronic 07/22/2015  ? Health care maintenance 02/17/2015  ? Hepatitis C 08/07/2014  ? Back pain 10/28/2012  ? LGSIL Pap smear of vagina 01/16/2012  ? Anal condylomata 11/18/2011  ? CAD (coronary artery disease) 08/07/2011  ? Essential hypertension, benign 12/25/2010  ? Human immunodeficiency virus (HIV) disease ([place]) 01/23/2007  ? TOBACCO ABUSE 01/23/2007  ? CATARACT NEC 01/23/2007  ? Gastroesophageal reflux disease 01/23/2007  ? ? ?PCP: Sonia Side., FNP ? ?REFERRING PROVIDER: Campbell Riches, MD ? ?REFERRING DIAG: Chronic left-sided low back pain without sciatica ? ?THERAPY DIAG:  ?No diagnosis found. ? ?ONSET DATE: *** ? ?SUBJECTIVE:                                                                                                                                                                                          ? ?SUBJECTIVE STATEMENT: ?*** ?PERTINENT HISTORY:  ?*** ? ?PAIN:  ?Are you having pain? Yes: {yespain:27235::"NPRS scale: ***/10","Pain location: ***","Pain description: ***","Aggravating factors: ***","Relieving factors: ***"} ? ? ?PRECAUTIONS: {Therapy precautions:24002} ? ?WEIGHT BEARING RESTRICTIONS {Yes ***/No:24003} ? ?FALLS:  ?Has patient fallen  in last 6 months? {yes/no:20286}, Number of falls: *** ? ?LIVING ENVIRONMENT: ?Lives with: {[facility] lives with:25569::"lives with their family"} ?Lives in: {Lives in:25570} ?Stairs: {yes/no:20286}; {Stairs:24000} ?Has following equipment at home: {Assistive devices:23999} ? ?OCCUPATION: *** ? ?PLOF: {PLOF:24004} ? ?PATIENT GOALS *** ? ? ?OBJECTIVE:  ? ?DIAGNOSTIC FINDINGS:  ?Lumbar X-ray 06/02/22 ?IMPRESSION: ?1. Right convex scoliosis. ?2. Spondylosis and facet hypertrophy at L4-5 and L5-S1. ? ?PATIENT SURVEYS:  ?{rehab surveys:24030} ? ?SCREENING FOR RED FLAGS: ?Bowel or bladder incontinence: {Yes/No:304960894} ?Spinal tumors: {Yes/No:304960894} ?Cauda  equina syndrome: {Yes/No:304960894} ?Compression fracture: {Yes/No:304960894} ?Abdominal aneurysm: {Yes/No:304960894} ? ?COGNITION: ? Overall cognitive status: {cognition:24006}   ?  ?SENSATION: ?{sensation:27233} ? ?MUSCLE LENGTH: ?Hamstrings: Right *** deg; Left *** deg ?Thomas test: Right *** deg; Left *** deg ? ?POSTURE:  ?*** ? ?PALPATION: ?*** ? ?LUMBAR ROM:  ? ?{AROM/PROM:27142}  A/PROM  ?02/27/2022  ?Flexion   ?Extension   ?Right lateral flexion   ?Left lateral flexion   ?Right rotation   ?Left rotation   ? (Blank rows = not tested) ? ?LE ROM: ? ?{AROM/PROM:27142}  Right ?02/27/2022 Left ?02/27/2022  ?Hip flexion    ?Hip extension    ?Hip abduction    ?Hip adduction    ?Hip internal rotation    ?Hip external rotation    ?Knee flexion    ?Knee extension    ?Ankle dorsiflexion    ?Ankle plantarflexion    ?Ankle inversion    ?Ankle eversion    ? (Blank rows = not tested) ? ?LE MMT: ? ?MMT Right ?02/27/2022 Left ?02/27/2022  ?Hip flexion    ?Hip extension    ?Hip abduction    ?Hip adduction    ?Hip internal rotation    ?Hip external rotation    ?Knee flexion    ?Knee extension    ?Ankle dorsiflexion    ?Ankle plantarflexion    ?Ankle inversion    ?Ankle eversion    ? (Blank rows = not tested) ? ?LUMBAR SPECIAL TESTS:  ?{lumbar special test:25242} ? ?FUNCTIONAL TESTS:   ?{Functional tests:24029} ? ?GAIT: ?Distance walked: *** ?Assistive device utilized: {Assistive devices:23999} ?Level of assistance: {Levels of assistance:24026} ?Comments: *** ? ? ? ?TODAY'S TREATMENT  ?*

## 2022-03-04 NOTE — Progress Notes (Signed)
Remote pacemaker transmission.   

## 2022-03-08 ENCOUNTER — Other Ambulatory Visit: Payer: Self-pay

## 2022-03-08 DIAGNOSIS — E43 Unspecified severe protein-calorie malnutrition: Secondary | ICD-10-CM

## 2022-03-08 MED ORDER — ENSURE PO LIQD: 237.0000 mL | Freq: Two times a day (BID) | ORAL | 5 refills | Status: DC

## 2022-03-14 ENCOUNTER — Telehealth: Payer: Self-pay | Admitting: *Deleted

## 2022-03-14 NOTE — Telephone Encounter (Signed)
Received message from answering service that a family member called last evening stating pt was SOB. ?TCT pt's home (this is the number listed on the on call service transcription) No answer but was able to leave vm message for pt/family to return the call @ their convenience. ?

## 2022-03-15 ENCOUNTER — Encounter: Payer: Self-pay | Admitting: Hematology and Oncology

## 2022-03-18 ENCOUNTER — Telehealth: Payer: Self-pay | Admitting: Hematology and Oncology

## 2022-03-18 NOTE — Telephone Encounter (Signed)
Attempted to return pt's call, did not receive an answer will try again tomorrow  ?

## 2022-03-19 ENCOUNTER — Ambulatory Visit: Payer: 59

## 2022-04-03 ENCOUNTER — Ambulatory Visit: Payer: Medicaid Other

## 2022-05-22 ENCOUNTER — Ambulatory Visit (INDEPENDENT_AMBULATORY_CARE_PROVIDER_SITE_OTHER): Payer: Medicare Other

## 2022-05-22 DIAGNOSIS — I495 Sick sinus syndrome: Secondary | ICD-10-CM | POA: Diagnosis not present

## 2022-05-24 LAB — CUP PACEART REMOTE DEVICE CHECK
Battery Remaining Longevity: 73 mo
Battery Remaining Percentage: 60 %
Battery Voltage: 2.99 V
Brady Statistic AP VP Percent: 1.2 %
Brady Statistic AP VS Percent: 94 %
Brady Statistic AS VP Percent: 1 %
Brady Statistic AS VS Percent: 4.5 %
Brady Statistic RA Percent Paced: 94 %
Brady Statistic RV Percent Paced: 1.2 %
Date Time Interrogation Session: 20230614020013
Implantable Lead Implant Date: 20190228
Implantable Lead Implant Date: 20190228
Implantable Lead Location: 753859
Implantable Lead Location: 753860
Implantable Pulse Generator Implant Date: 20190228
Lead Channel Impedance Value: 390 Ohm
Lead Channel Impedance Value: 480 Ohm
Lead Channel Pacing Threshold Amplitude: 0.375 V
Lead Channel Pacing Threshold Amplitude: 0.625 V
Lead Channel Pacing Threshold Pulse Width: 0.5 ms
Lead Channel Pacing Threshold Pulse Width: 0.5 ms
Lead Channel Sensing Intrinsic Amplitude: 5 mV
Lead Channel Sensing Intrinsic Amplitude: 8.4 mV
Lead Channel Setting Pacing Amplitude: 0.875
Lead Channel Setting Pacing Amplitude: 1.375
Lead Channel Setting Pacing Pulse Width: 0.5 ms
Lead Channel Setting Sensing Sensitivity: 2 mV
Pulse Gen Model: 2272
Pulse Gen Serial Number: 8996997

## 2022-05-29 ENCOUNTER — Encounter: Payer: Self-pay | Admitting: Infectious Diseases

## 2022-06-04 NOTE — Progress Notes (Signed)
Remote pacemaker transmission.   

## 2022-07-09 ENCOUNTER — Other Ambulatory Visit: Payer: Self-pay

## 2022-07-09 DIAGNOSIS — E43 Unspecified severe protein-calorie malnutrition: Secondary | ICD-10-CM

## 2022-07-09 MED ORDER — ENSURE PO LIQD: 237.0000 mL | Freq: Two times a day (BID) | ORAL | 5 refills | Status: DC

## 2022-07-09 NOTE — Progress Notes (Signed)
Faxed to [facility] case worker [place] at [phone]

## 2022-07-29 ENCOUNTER — Other Ambulatory Visit: Payer: Self-pay | Admitting: Family

## 2022-07-29 DIAGNOSIS — R0989 Other specified symptoms and signs involving the circulatory and respiratory systems: Secondary | ICD-10-CM

## 2022-07-30 ENCOUNTER — Other Ambulatory Visit: Payer: Self-pay | Admitting: Family

## 2022-07-30 DIAGNOSIS — M546 Pain in thoracic spine: Secondary | ICD-10-CM

## 2022-07-31 ENCOUNTER — Other Ambulatory Visit: Payer: Medicaid Other

## 2022-08-09 ENCOUNTER — Telehealth: Payer: Self-pay | Admitting: Hematology and Oncology

## 2022-08-09 ENCOUNTER — Inpatient Hospital Stay: Payer: Medicaid Other | Admitting: Hematology and Oncology

## 2022-08-09 ENCOUNTER — Inpatient Hospital Stay: Payer: Medicaid Other

## 2022-08-09 ENCOUNTER — Other Ambulatory Visit: Payer: Self-pay | Admitting: Hematology and Oncology

## 2022-08-09 DIAGNOSIS — D500 Iron deficiency anemia secondary to blood loss (chronic): Secondary | ICD-10-CM

## 2022-08-09 NOTE — Telephone Encounter (Signed)
Per 9/1 staff message called and left message for pt

## 2022-08-14 ENCOUNTER — Other Ambulatory Visit: Payer: Self-pay | Admitting: Hematology and Oncology

## 2022-08-14 ENCOUNTER — Encounter: Payer: Self-pay | Admitting: Hematology and Oncology

## 2022-08-19 ENCOUNTER — Other Ambulatory Visit: Payer: Medicaid Other

## 2022-08-21 ENCOUNTER — Ambulatory Visit (INDEPENDENT_AMBULATORY_CARE_PROVIDER_SITE_OTHER): Payer: Medicare Other

## 2022-08-21 DIAGNOSIS — I495 Sick sinus syndrome: Secondary | ICD-10-CM | POA: Diagnosis not present

## 2022-08-22 ENCOUNTER — Inpatient Hospital Stay: Payer: Medicare Other | Admitting: Hematology and Oncology

## 2022-08-22 ENCOUNTER — Inpatient Hospital Stay: Payer: Medicare Other | Attending: Family

## 2022-08-23 ENCOUNTER — Other Ambulatory Visit: Payer: Self-pay | Admitting: Infectious Diseases

## 2022-08-23 ENCOUNTER — Ambulatory Visit: Payer: Medicare Other | Admitting: Podiatry

## 2022-08-23 DIAGNOSIS — B20 Human immunodeficiency virus [HIV] disease: Secondary | ICD-10-CM

## 2022-08-23 LAB — CUP PACEART REMOTE DEVICE CHECK
Battery Remaining Longevity: 67 mo
Battery Remaining Percentage: 57 %
Battery Voltage: 2.99 V
Brady Statistic AP VP Percent: 1.4 %
Brady Statistic AP VS Percent: 96 %
Brady Statistic AS VP Percent: 1 %
Brady Statistic AS VS Percent: 2.6 %
Brady Statistic RA Percent Paced: 96 %
Brady Statistic RV Percent Paced: 1.4 %
Date Time Interrogation Session: 20230913035710
Implantable Lead Implant Date: 20190228
Implantable Lead Implant Date: 20190228
Implantable Lead Location: 753859
Implantable Lead Location: 753860
Implantable Pulse Generator Implant Date: 20190228
Lead Channel Impedance Value: 350 Ohm
Lead Channel Impedance Value: 440 Ohm
Lead Channel Pacing Threshold Amplitude: 0.5 V
Lead Channel Pacing Threshold Amplitude: 0.625 V
Lead Channel Pacing Threshold Pulse Width: 0.5 ms
Lead Channel Pacing Threshold Pulse Width: 0.5 ms
Lead Channel Sensing Intrinsic Amplitude: 3.8 mV
Lead Channel Sensing Intrinsic Amplitude: 6.9 mV
Lead Channel Setting Pacing Amplitude: 0.875
Lead Channel Setting Pacing Amplitude: 1.5 V
Lead Channel Setting Pacing Pulse Width: 0.5 ms
Lead Channel Setting Sensing Sensitivity: 2 mV
Pulse Gen Model: 2272
Pulse Gen Serial Number: 8996997

## 2022-08-26 ENCOUNTER — Other Ambulatory Visit: Payer: Medicaid Other

## 2022-08-29 ENCOUNTER — Other Ambulatory Visit: Payer: Medicaid Other

## 2022-09-04 ENCOUNTER — Encounter: Payer: Medicaid Other | Admitting: Obstetrics and Gynecology

## 2022-09-05 ENCOUNTER — Encounter: Payer: Self-pay | Admitting: Hematology and Oncology

## 2022-09-05 NOTE — Progress Notes (Signed)
Remote pacemaker transmission.   

## 2022-09-10 ENCOUNTER — Encounter: Payer: Medicaid Other | Admitting: Infectious Diseases

## 2022-09-10 ENCOUNTER — Encounter: Payer: Self-pay | Admitting: Hematology and Oncology

## 2022-09-19 ENCOUNTER — Ambulatory Visit: Payer: Medicare Other | Admitting: Podiatry

## 2022-09-25 ENCOUNTER — Other Ambulatory Visit: Payer: Medicare Other

## 2022-09-30 ENCOUNTER — Inpatient Hospital Stay: Payer: Medicare Other

## 2022-09-30 ENCOUNTER — Inpatient Hospital Stay: Payer: Medicare Other | Admitting: Hematology and Oncology

## 2022-10-07 ENCOUNTER — Inpatient Hospital Stay: Payer: Medicare Other | Attending: Family

## 2022-10-07 ENCOUNTER — Other Ambulatory Visit: Payer: Self-pay

## 2022-10-07 ENCOUNTER — Telehealth: Payer: Self-pay | Admitting: Hematology and Oncology

## 2022-10-07 ENCOUNTER — Inpatient Hospital Stay (HOSPITAL_BASED_OUTPATIENT_CLINIC_OR_DEPARTMENT_OTHER): Payer: Medicare Other | Admitting: Hematology and Oncology

## 2022-10-07 VITALS — BP 127/86 | HR 84 | Temp 98.2°F | Resp 15 | Wt 124.8 lb

## 2022-10-07 DIAGNOSIS — D509 Iron deficiency anemia, unspecified: Secondary | ICD-10-CM | POA: Diagnosis not present

## 2022-10-07 DIAGNOSIS — I119 Hypertensive heart disease without heart failure: Secondary | ICD-10-CM | POA: Insufficient documentation

## 2022-10-07 DIAGNOSIS — I350 Nonrheumatic aortic (valve) stenosis: Secondary | ICD-10-CM | POA: Insufficient documentation

## 2022-10-07 DIAGNOSIS — Z7951 Long term (current) use of inhaled steroids: Secondary | ICD-10-CM | POA: Diagnosis not present

## 2022-10-07 DIAGNOSIS — K219 Gastro-esophageal reflux disease without esophagitis: Secondary | ICD-10-CM | POA: Diagnosis not present

## 2022-10-07 DIAGNOSIS — R5383 Other fatigue: Secondary | ICD-10-CM | POA: Insufficient documentation

## 2022-10-07 DIAGNOSIS — D7589 Other specified diseases of blood and blood-forming organs: Secondary | ICD-10-CM | POA: Diagnosis not present

## 2022-10-07 DIAGNOSIS — R531 Weakness: Secondary | ICD-10-CM | POA: Insufficient documentation

## 2022-10-07 DIAGNOSIS — I2510 Atherosclerotic heart disease of native coronary artery without angina pectoris: Secondary | ICD-10-CM | POA: Diagnosis not present

## 2022-10-07 DIAGNOSIS — Z800 Family history of malignant neoplasm of digestive organs: Secondary | ICD-10-CM | POA: Diagnosis not present

## 2022-10-07 DIAGNOSIS — Z79899 Other long term (current) drug therapy: Secondary | ICD-10-CM | POA: Insufficient documentation

## 2022-10-07 DIAGNOSIS — F17210 Nicotine dependence, cigarettes, uncomplicated: Secondary | ICD-10-CM | POA: Diagnosis not present

## 2022-10-07 DIAGNOSIS — Z8041 Family history of malignant neoplasm of ovary: Secondary | ICD-10-CM | POA: Diagnosis not present

## 2022-10-07 DIAGNOSIS — E785 Hyperlipidemia, unspecified: Secondary | ICD-10-CM | POA: Insufficient documentation

## 2022-10-07 DIAGNOSIS — D500 Iron deficiency anemia secondary to blood loss (chronic): Secondary | ICD-10-CM

## 2022-10-07 LAB — CBC WITH DIFFERENTIAL (CANCER CENTER ONLY)
Abs Immature Granulocytes: 0 10*3/uL (ref 0.00–0.07)
Basophils Absolute: 0 10*3/uL (ref 0.0–0.1)
Basophils Relative: 1 %
Eosinophils Absolute: 0.1 10*3/uL (ref 0.0–0.5)
Eosinophils Relative: 3 %
HCT: 37.5 % (ref 36.0–46.0)
Hemoglobin: 12.3 g/dL (ref 12.0–15.0)
Immature Granulocytes: 0 %
Lymphocytes Relative: 31 %
Lymphs Abs: 1.2 10*3/uL (ref 0.7–4.0)
MCH: 33.6 pg (ref 26.0–34.0)
MCHC: 32.8 g/dL (ref 30.0–36.0)
MCV: 102.5 fL — ABNORMAL HIGH (ref 80.0–100.0)
Monocytes Absolute: 0.4 10*3/uL (ref 0.1–1.0)
Monocytes Relative: 11 %
Neutro Abs: 2.1 10*3/uL (ref 1.7–7.7)
Neutrophils Relative %: 54 %
Platelet Count: 207 10*3/uL (ref 150–400)
RBC: 3.66 MIL/uL — ABNORMAL LOW (ref 3.87–5.11)
RDW: 14.1 % (ref 11.5–15.5)
WBC Count: 3.9 10*3/uL — ABNORMAL LOW (ref 4.0–10.5)
nRBC: 0 % (ref 0.0–0.2)

## 2022-10-07 LAB — CMP (CANCER CENTER ONLY)
ALT: 7 U/L (ref 0–44)
AST: 11 U/L — ABNORMAL LOW (ref 15–41)
Albumin: 4.1 g/dL (ref 3.5–5.0)
Alkaline Phosphatase: 78 U/L (ref 38–126)
Anion gap: 6 (ref 5–15)
BUN: 18 mg/dL (ref 8–23)
CO2: 30 mmol/L (ref 22–32)
Calcium: 9.6 mg/dL (ref 8.9–10.3)
Chloride: 104 mmol/L (ref 98–111)
Creatinine: 0.84 mg/dL (ref 0.44–1.00)
GFR, Estimated: >60 mL/min (ref >60)
Glucose, Bld: 104 mg/dL — ABNORMAL HIGH (ref 70–99)
Potassium: 3.6 mmol/L (ref 3.5–5.1)
Sodium: 140 mmol/L (ref 135–145)
Total Bilirubin: 0.5 mg/dL (ref 0.3–1.2)
Total Protein: 8.1 g/dL (ref 6.5–8.1)

## 2022-10-07 LAB — RETIC PANEL
Immature Retic Fract: 19.5 % — ABNORMAL HIGH (ref 2.3–15.9)
RBC.: 3.67 MIL/uL — ABNORMAL LOW (ref 3.87–5.11)
Retic Count, Absolute: 106.4 10*3/uL (ref 19.0–186.0)
Retic Ct Pct: 2.9 % (ref 0.4–3.1)
Reticulocyte Hemoglobin: 29.8 pg (ref >27.9)

## 2022-10-07 LAB — IRON AND IRON BINDING CAPACITY (CC-WL,HP ONLY)
Iron: 44 ug/dL (ref 28–170)
Saturation Ratios: 9 % — ABNORMAL LOW (ref 10.4–31.8)
TIBC: 472 ug/dL — ABNORMAL HIGH (ref 250–450)
UIBC: 428 ug/dL (ref 148–442)

## 2022-10-07 LAB — FERRITIN: Ferritin: 12 ng/mL (ref 11–307)

## 2022-10-07 NOTE — Telephone Encounter (Signed)
Per 10/30 los called and left pt a message about appointment

## 2022-10-07 NOTE — Progress Notes (Signed)
[place] [phone]   [phone]) [phone]  PROGRESS NOTE  Patient Care Team: Sonia Side., FNP as PCP - General (Family Medicine) Johnnye Sima Doroteo Bradford, MD as PCP - Infectious Diseases (Infectious Diseases) Thompson Grayer, MD as PCP - Electrophysiology (Cardiology) Arta Silence, MD as Consulting Physician (Gastroenterology)  Hematological/Oncological History # Iron Deficiency Anemia 2/2 to GI Bleed  10/29/2020: patient presented the ED with weakness and was found to have a Hgb of 4.1 11/01/2020: GI evaluation showed numerous bleeding/nonbleeding AVMS. Cauterization performed.  12/18/2020: Iron 32, TIBC 441, Sat 7%, Ferritin 31. Hgb 8.3, Plt 313, WBC 4.2, MCV 102.5.  01/03/2021: establish care with Dr. Lorenso Courier  2/4-2/09/2021: IV feraheme '510mg'$  x 2 doses. Received 1 unit of PRBC on 01/12/2021  08/01/2021: WBC 4.5, Hgb 12.1, MCV 104.9, Plt 223 02/06/2022: WBC 5.7, Hgb 13.0, MCV 104.8, Plt 196  Interval History:  [place] 76 y.o. female with medical history significant for iron deficiency anemia 2/2 to GI bleed who presents for a follow up visit. The patient's last visit was on 02/06/2021. In the interim since the last visit she has been in her usual state of health.   On exam today [place] notes she has continued on p.o. iron therapy in interim since her last visit.  She notes that she is having considerably less fatigue but overall feels quite good.  She notes that occasionally her legs give out and become progressively more weak when trying to walk.  She notes she is not having any lightheadedness or dizziness though she does have some occasional nausea.  She notes that she cannot stand up quickly that may cause issues.  Iron pills did not cause her any stomach upset or constipation.  She notes her energy levels are "great".  And that she cannot complain.  She does that she recently moved [place] a [place] in [place] because of the high health.  She denied having  any overt signs of bleeding though she has lost approximately 5 pounds in the interim since her last visit.  She continues to drink wine approximately 1 glass nightly.  Her weight has been stable and she otherwise denies any fevers, chills, sweats, nausea, vomiting or diarrhea.  She denies any overt signs of bleeding or bruising.  A full 10 point ROS is listed below.  MEDICAL HISTORY:  Past Medical History:  Diagnosis Date   Abnormal Pap smear    Aortic stenosis    TTE 2021 with mean gradient 22mHg   Arthritis    Asthma    AVM (arteriovenous malformation)    Bradycardia    Complication of anesthesia    "they have a hard time bringing me back" (11/08/2015)   Coronary artery disease    High grade RCA disease; diagnosed during acute GIB; on medical therapy   Dynamic left ventricular outflow obstruction    GERD (gastroesophageal reflux disease)    previously on aciphex, discontinued november 2012  because patient asymptomatic, and concern about interference with HIV meds.  May try pepcid in the future if symptoms return   Heart murmur    Hepatitis C antibody test positive    HIV infection ([place]    diagnosed before 2008   Hyperlipidemia 09/12/2021   Hypertension    Hypertensive heart disease    Iron deficiency anemia    Ferritin = 2 in november 2012, started on iron supplemenation   Pacemaker    a. [employer] PPM 01/2018.   Seizures ([place]  Symptomatic anemia 08/24/2020   Varicosities     SURGICAL HISTORY: Past Surgical History:  Procedure Laterality Date   ABDOMINAL HYSTERECTOMY     CARDIAC CATHETERIZATION N/A 11/08/2015   Procedure: Left Heart Cath and Coronary Angiography;  Surgeon: Jettie Booze, MD;  Location: [facility];  Service: Cardiovascular;  Laterality: N/A;   COLONOSCOPY  10/13/11   small adenoma, anal condyloma   COLONOSCOPY  10/13/2011   Procedure: COLONOSCOPY;  Surgeon: Gatha Mayer, MD;  Location: [facility];  Service: Endoscopy;  Laterality: N/A;    COLONOSCOPY N/A 09/14/2014   Procedure: COLONOSCOPY;  Surgeon: Arta Silence, MD;  Location: [facility] ENDOSCOPY;  Service: Endoscopy;  Laterality: N/A;   COLONOSCOPY WITH PROPOFOL N/A 06/05/2020   Procedure: COLONOSCOPY WITH PROPOFOL;  Surgeon: Arta Silence, MD;  Location: [place];  Service: Endoscopy;  Laterality: N/A;   ENTEROSCOPY N/A 10/31/2020   Procedure: ENTEROSCOPY;  Surgeon: Otis Brace, MD;  Location: [facility] ENDOSCOPY;  Service: Gastroenterology;  Laterality: N/A;   ESOPHAGOGASTRODUODENOSCOPY  10/13/11   small hiatal hernia   ESOPHAGOGASTRODUODENOSCOPY  10/13/2011   Procedure: ESOPHAGOGASTRODUODENOSCOPY (EGD);  Surgeon: Gatha Mayer, MD;  Location: [facility] ENDOSCOPY;  Service: Endoscopy;  Laterality: N/A;   ESOPHAGOGASTRODUODENOSCOPY N/A 09/14/2014   Procedure: ESOPHAGOGASTRODUODENOSCOPY (EGD);  Surgeon: Arta Silence, MD;  Location: [facility] ENDOSCOPY;  Service: Endoscopy;  Laterality: N/A;   ESOPHAGOGASTRODUODENOSCOPY (EGD) WITH PROPOFOL N/A 06/05/2020   Procedure: ESOPHAGOGASTRODUODENOSCOPY (EGD) WITH PROPOFOL;  Surgeon: Arta Silence, MD;  Location: [place];  Service: Endoscopy;  Laterality: N/A;   GIVENS CAPSULE STUDY N/A 09/14/2014   Procedure: GIVENS CAPSULE STUDY;  Surgeon: Arta Silence, MD;  Location: [facility] ENDOSCOPY;  Service: Endoscopy;  Laterality: N/A;   GIVENS CAPSULE STUDY N/A 08/26/2020   Procedure: GIVENS CAPSULE STUDY;  Surgeon: Wonda Horner, MD;  Location: [facility] ENDOSCOPY;  Service: Endoscopy;  Laterality: N/A;   HOT HEMOSTASIS N/A 10/31/2020   Procedure: HOT HEMOSTASIS (ARGON PLASMA COAGULATION/BICAP);  Surgeon: Otis Brace, MD;  Location: [facility] ENDOSCOPY;  Service: Gastroenterology;  Laterality: N/A;   PACEMAKER IMPLANT N/A 02/05/2018   Procedure: PACEMAKER IMPLANT;  Surgeon: Thompson Grayer, MD;  Location: [facility];  Service: Cardiovascular;  Laterality: N/A;   POLYPECTOMY  06/05/2020   Procedure: POLYPECTOMY;  Surgeon: Arta Silence, MD;  Location: [facility]  ENDOSCOPY;  Service: Endoscopy;;    SOCIAL HISTORY: Social History   Socioeconomic History   Marital status: Married    Spouse name: Not on file   Number of children: 6   Years of education: Not on file   Highest education level: Not on file  Occupational History   Occupation: retired  Tobacco Use   Smoking status: Every Day    Packs/day: 1.00    Years: 60.00    Total pack years: 60.00    Types: Cigarettes   Smokeless tobacco: Never   Tobacco comments:    Wants to quit. Using both the patch and gum.  Vaping Use   Vaping Use: Never used  Substance and Sexual Activity   Alcohol use: Yes    Alcohol/week: 10.0 standard drinks of alcohol    Types: 10 Glasses of wine per week    Comment: drinking less recently, denies h/o heavy use   Drug use: No   Sexual activity: Yes    Partners: Male    Birth control/protection: Surgical    Comment: pt. given condoms 08/23/20  Other Topics Concern   Not on file  Social History Narrative   Not on file  Social Determinants of Health   Financial Resource Strain: Not on file  Food Insecurity: Not on file  Transportation Needs: Not on file  Physical Activity: Not on file  Stress: Not on file  Social Connections: Not on file  Intimate Partner Violence: Not on file    FAMILY HISTORY: Family History  Problem Relation Age of Onset   Diabetes Mother    Ovarian cancer Sister    Colon cancer Sister     ALLERGIES:  is allergic to penicillins, penicillin g, and avelox [moxifloxacin hcl in nacl].  MEDICATIONS:  Current Outpatient Medications  Medication Sig Dispense Refill   acyclovir (ZOVIRAX) 400 MG tablet Take 1 tablet (400 mg total) by mouth daily as needed (for flare ups). 30 tablet 3   albuterol (PROAIR HFA) 108 (90 Base) MCG/ACT inhaler INHALE 2 PUFFS BY MOUTH EVERY 4 HOURS IF NEEDED FOR WHEEZING OR SHORTNESS OF BREATH (Patient taking differently: Inhale 2 puffs into the lungs every 4 (four) hours as needed for wheezing or  shortness of breath.) 8.5 g 0   amLODipine (NORVASC) 10 MG tablet TAKE 1 TABLET BY MOUTH EVERY DAY (Patient taking differently: Take 10 mg by mouth daily.) 90 tablet 0   Ascorbic Acid (VITAMIN C) 1000 MG tablet Take 1,000 mg by mouth daily.     atorvastatin (LIPITOR) 40 MG tablet take 1 tablet by mouth once daily AT 6 PM (Patient taking differently: Take 40 mg by mouth every evening.) 90 tablet 0   BIKTARVY 50-200-25 MG TABS tablet TAKE 1 TABLET BY MOUTH EVERY DAY (Patient taking differently: Take 1 tablet by mouth daily.) 90 tablet 3   cholecalciferol (VITAMIN D3) 25 MCG (1000 UNIT) tablet Take 1,000 Units by mouth daily.     cyanocobalamin (VITAMIN B12) 1000 MCG tablet TAKE 1 TABLET BY MOUTH EVERY DAY 90 tablet 1   dextromethorphan (DELSYM) 30 MG/5ML liquid Take 30 mg by mouth as needed for cough.      diclofenac (VOLTAREN) 75 MG EC tablet Take 1 tablet (75 mg total) by mouth 2 (two) times daily as needed. Do not take until finished with steroid taper 60 tablet 2   Ensure (ENSURE) Take 237 mLs by mouth 2 (two) times daily between meals. 5688 mL 5   escitalopram (LEXAPRO) 10 MG tablet Take 10 mg by mouth daily as needed (anxiety).     famotidine (PEPCID) 20 MG tablet Take 20 mg by mouth 2 (two) times daily.     ferrous sulfate 325 (65 FE) MG EC tablet Take 1 tablet (325 mg total) by mouth in the morning and at bedtime. 180 tablet 0   Flaxseed, Linseed, (FLAX SEED OIL) 1000 MG CAPS Take 1,000 mg by mouth daily.     folic acid (FOLVITE) 1 MG tablet TAKE 1 TABLET BY MOUTH EVERY DAY 90 tablet 1   gabapentin (NEURONTIN) 300 MG capsule      hydrochlorothiazide (HYDRODIURIL) 25 MG tablet Take 25 mg by mouth daily.      HYDROcodone-acetaminophen (NORCO/VICODIN) 5-325 MG tablet Take 1 tablet by mouth 2 (two) times daily as needed.     isosorbide mononitrate (IMDUR) 60 MG 24 hr tablet Take 1 tablet (60 mg total) by mouth daily. 90 tablet 1   lisinopril (ZESTRIL) 40 MG tablet Take 40 mg by mouth daily.      loratadine (CLARITIN) 10 MG tablet 1 tablet     meclizine (ANTIVERT) 25 MG tablet Take 25 mg by mouth as needed for dizziness.  methocarbamol (ROBAXIN) 500 MG tablet Take 1 tablet (500 mg total) by mouth 2 (two) times daily as needed. 20 tablet 1   metoprolol succinate (TOPROL-XL) 50 MG 24 hr tablet Take 1 tablet (50 mg total) by mouth at bedtime. Take with or immediately following a meal. 90 tablet 3   Omega-3 Fatty Acids (FISH OIL) 1000 MG CPDR Take 1,000 mg by mouth daily.     ondansetron (ZOFRAN-ODT) 4 MG disintegrating tablet 1 tablet on the tongue and allow to dissolve     pantoprazole (PROTONIX) 40 MG tablet Take 1 tablet (40 mg total) by mouth daily. 90 tablet 0   predniSONE (STERAPRED UNI-PAK 21 TAB) 10 MG (21) TBPK tablet Take as directed 21 tablet 0   spironolactone (ALDACTONE) 25 MG tablet TAKE 1 TABLET BY MOUTH EVERY DAY (Patient taking differently: Take 25 mg by mouth daily.) 30 tablet 0   SYMBICORT 160-4.5 MCG/ACT inhaler Inhale 2 puffs into the lungs 2 (two) times daily.     vitamin A 10000 UNIT capsule Take 10,000 Units by mouth daily.     No current facility-administered medications for this visit.    REVIEW OF SYSTEMS:   Constitutional: ( - ) fevers, ( - )  chills , ( - ) night sweats Eyes: ( - ) blurriness of vision, ( - ) double vision, ( - ) watery eyes Ears, nose, mouth, throat, and face: ( - ) mucositis, ( - ) sore throat Respiratory: ( - ) cough, ( - ) dyspnea, ( - ) wheezes Cardiovascular: ( - ) palpitation, ( - ) chest discomfort, ( - ) lower extremity swelling Gastrointestinal:  ( - ) nausea, ( - ) heartburn, ( - ) change in bowel habits Skin: ( - ) abnormal skin rashes Lymphatics: ( - ) new lymphadenopathy, ( - ) easy bruising Neurological: ( - ) numbness, ( - ) tingling, ( - ) new weaknesses Behavioral/Psych: ( - ) mood change, ( - ) new changes  All other systems were reviewed with the patient and are negative.  PHYSICAL EXAMINATION:  Vitals:    10/07/22 0955  BP: 127/86  Pulse: 84  Resp: 15  Temp: 98.2 F (36.8 C)  SpO2: 100%   Filed Weights   10/07/22 0955  Weight: 124 lb 12.8 oz (56.6 kg)    GENERAL: well appearing elderly African American female. alert, no distress and comfortable SKIN: skin color, texture, turgor are normal, no rashes or significant lesions EYES: conjunctiva are pink and non-injected, sclera clear LUNGS: clear to auscultation and percussion with normal breathing effort HEART: regular rate & rhythm and no murmurs and no lower extremity edema Musculoskeletal: no cyanosis of digits and no clubbing  PSYCH: alert & oriented x 3, fluent speech NEURO: no focal motor/sensory deficits  LABORATORY DATA:  I have reviewed the data as listed    Latest Ref Rng & Units 10/07/2022    9:35 AM 02/20/2022    9:45 AM 02/15/2022   10:07 AM  CBC  WBC 4.0 - 10.5 K/uL 3.9  4.6  4.9   Hemoglobin 12.0 - 15.0 g/dL 12.3  13.6  14.3   Hematocrit 36.0 - 46.0 % 37.5  40.1  43.4   Platelets 150 - 400 K/uL 207  191  204        Latest Ref Rng & Units 10/07/2022    9:35 AM 02/20/2022    9:45 AM 02/15/2022   10:07 AM  CMP  Glucose 70 - 99 mg/dL 104  119  88   BUN 8 - 23 mg/dL '18  19  24   '$ Creatinine 0.44 - 1.00 mg/dL 0.84  0.84  0.81   Sodium 135 - 145 mmol/L 140  138  141   Potassium 3.5 - 5.1 mmol/L 3.6  4.0  4.4   Chloride 98 - 111 mmol/L 104  101  103   CO2 22 - 32 mmol/L 30  26  33   Calcium 8.9 - 10.3 mg/dL 9.6  9.4  9.9   Total Protein 6.5 - 8.1 g/dL 8.1   7.4   Total Bilirubin 0.3 - 1.2 mg/dL 0.5   0.5   Alkaline Phos 38 - 126 U/L 78     AST 15 - 41 U/L 11   15   ALT 0 - 44 U/L 7   11     RADIOGRAPHIC STUDIES: No results found.  ASSESSMENT & PLAN [place] 76 y.o. female with medical history significant for iron deficiency anemia 2/2 to GI bleed who presents for a follow up visit.   After review the labs, the records, discussion the patient the findings most consistent with an iron deficiency anemia  responding to IV iron therapy.  Given the macrocytosis I would recommend that the patient d/c ETOH use while she continues vitamin B12 and folate therapy.  She also reports that she drinks approximately 1 glass of wine per night which may be partially to blame for her macrocytosis.  We will plan to have her repeat labs with clinic visit in 6 months The patient voiced understanding of this plan moving forward.  # Iron Deficiency Anemia 2/2 to GI Bleed   -- findings were consistent with persistent iron deficiency anemia despite adequate PO iron therapy --Hgb today stable, Hgb 12.3, MCV 102.5 --patient is s/p '510mg'$  IV feraheme q 7 days x 2 doses on 2/4-2/10/22.  --today will order repeat iron panel, ferritin, CBC, CMP and reticulocyte panel  --retic panel shows a modest increase in reticulocyte count, with reticulocytosis near adequate. This implies some continued GI blood loss. --continue PO ferrous sulfate '325mg'$  daily. Take with a source of Vitamin C (orange juice best)  --RTC in 6 months   #Macrocytosis --given the timing of this macrocytosis (right around when she started drinking 1 glass of wine 3-4 x per week), ETOH could be the cause of this macrocytosis. She has decreased to 2 glasses per week.  --Hgb 12.3, MCV 102.5 --less likely secondary to deficiency with folate or vitamin b12 as she has been supplementing these nutrients --potentially represent robust reticulocytosis, but ETOH seems more likely at this time.   --patient not currently on any medications known to cause macrocytosis.   No orders of the defined types were placed in this encounter.  All questions were answered. The patient knows to call the clinic with any problems, questions or concerns.  A total of more than 30 minutes were spent on this encounter and over half of that time was spent on counseling and coordination of care as outlined above.   Ledell Peoples, MD Department of Hematology/Oncology [facility] Phone: [phone] Pager: [phone] Email: [email or web address]  10/07/2022 2:41 PM

## 2022-10-09 ENCOUNTER — Encounter: Payer: Medicare Other | Admitting: Infectious Diseases

## 2022-10-13 NOTE — Progress Notes (Deleted)
Electrophysiology Office Note Date: 10/13/2022  ID:  Nicole Mccormick, DOB 19-Oct-1946, MRN [id number]  PCP: Sonia Side., FNP Primary Cardiologist: None Electrophysiologist: Dr. Rayann Heman -> Melida Quitter, MD   CC: Pacemaker follow-up  Nicole Mccormick is a 76 y.o. female seen today for Melida Quitter, MD for routine electrophysiology followup. Since last being seen in our clinic the patient reports doing ***.  she denies chest pain, palpitations, dyspnea, PND, orthopnea, nausea, vomiting, dizziness, syncope, edema, weight gain, or early satiety.   Device History: St. Jude Dual Chamber PPM implanted 01/2018 for symptomatic bradycardia   Past Medical History:  Diagnosis Date   Abnormal Pap smear    Aortic stenosis    TTE 2021 with mean gradient 37mHg   Arthritis    Asthma    AVM (arteriovenous malformation)    Bradycardia    Complication of anesthesia    "they have a hard time bringing me back" (11/08/2015)   Coronary artery disease    High grade RCA disease; diagnosed during acute GIB; on medical therapy   Dynamic left ventricular outflow obstruction    GERD (gastroesophageal reflux disease)    previously on aciphex, discontinued november 2012  because patient asymptomatic, and concern about interference with HIV meds.  May try pepcid in the future if symptoms return   Heart murmur    Hepatitis C antibody test positive    HIV infection ([facility]    diagnosed before 2008   Hyperlipidemia 09/12/2021   Hypertension    Hypertensive heart disease    Iron deficiency anemia    Ferritin = 2 in november 2012, started on iron supplemenation   Pacemaker    a. St Jude PPM 01/2018.   [place]    Symptomatic anemia 08/24/2020   Varicosities    Past Surgical History:  Procedure Laterality Date   ABDOMINAL HYSTERECTOMY     CARDIAC CATHETERIZATION N/A 11/08/2015   Procedure: Left Heart Cath and Coronary Angiography;  Surgeon: JJettie Booze MD;  [facility];   Service: Cardiovascular;  Laterality: N/A;   COLONOSCOPY  10/13/11   small adenoma, anal condyloma   COLONOSCOPY  10/13/2011   Procedure: COLONOSCOPY;  Surgeon: CGatha Mayer MD;  [place] [facility]  Service: Endoscopy;  Laterality: N/A;   COLONOSCOPY N/A 09/14/2014   Procedure: COLONOSCOPY;  Surgeon: WArta Silence MD;  [facility];  Service: Endoscopy;  Laterality: N/A;   COLONOSCOPY WITH PROPOFOL N/A 06/05/2020   Procedure: COLONOSCOPY WITH PROPOFOL;  Surgeon: OArta Silence MD;  [place]  Service: Endoscopy;  Laterality: N/A;   ENTEROSCOPY N/A 10/31/2020   Procedure: ENTEROSCOPY;  Surgeon: BOtis Brace MD;  [facility];  Service: Gastroenterology;  Laterality: N/A;   ESOPHAGOGASTRODUODENOSCOPY  10/13/11   small hiatal hernia   ESOPHAGOGASTRODUODENOSCOPY  10/13/2011   Procedure: ESOPHAGOGASTRODUODENOSCOPY (EGD);  Surgeon: CGatha Mayer MD;  [facility];  Service: Endoscopy;  Laterality: N/A;   ESOPHAGOGASTRODUODENOSCOPY N/A 09/14/2014   Procedure: ESOPHAGOGASTRODUODENOSCOPY (EGD);  Surgeon: WArta Silence MD;  [facility]  Service: Endoscopy;  Laterality: N/A;   ESOPHAGOGASTRODUODENOSCOPY (EGD) WITH PROPOFOL N/A 06/05/2020   Procedure: ESOPHAGOGASTRODUODENOSCOPY (EGD) WITH PROPOFOL;  Surgeon: OArta Silence MD;  [place]  Service: Endoscopy;  Laterality: N/A;   GIVENS CAPSULE STUDY N/A 09/14/2014   Procedure: GIVENS CAPSULE STUDY;  Surgeon: WArta Silence MD;  [facility]  Service: Endoscopy;  Laterality: N/A;   GIVENS CAPSULE STUDY N/A 08/26/2020   Procedure: GIVENS  CAPSULE STUDY;  Surgeon: Wonda Horner, MD;  [facility];  Service: Endoscopy;  Laterality: N/A;   HOT HEMOSTASIS N/A 10/31/2020   Procedure: HOT HEMOSTASIS (ARGON PLASMA COAGULATION/BICAP);  Surgeon: Otis Brace, MD;  Location[facility] END[facility]COPY;  Service: Gastroenterology;  Laterality: N/A;   PACEMAKER IMPLANT  N/A 02/05/2018   Procedure: PACEMAKER IMPLANT;  Surgeon: Thompson Grayer, MD;  Location: [place] [facility];  Service: Cardiovascular;  Laterality: N/A;   POLYPECTOMY  06/05/2020   Procedure: POLYPECTOMY;  Surgeon: Arta Silence, MD;  Location: [place] ENDOSCOPY;  Service: Endoscopy;;    Current Outpatient Medications  Medication Sig Dispense Refill   acyclovir (ZOVIRAX) 400 MG tablet Take 1 tablet (400 mg total) by mouth daily as needed (for flare ups). 30 tablet 3   albuterol (PROAIR HFA) 108 (90 Base) MCG/ACT inhaler INHALE 2 PUFFS BY MOUTH EVERY 4 HOURS IF NEEDED FOR WHEEZING OR SHORTNESS OF BREATH (Patient taking differently: Inhale 2 puffs into the lungs every 4 (four) hours as needed for wheezing or shortness of breath.) 8.5 g 0   amLODipine (NORVASC) 10 MG tablet TAKE 1 TABLET BY MOUTH EVERY DAY (Patient taking differently: Take 10 mg by mouth daily.) 90 tablet 0   Ascorbic Acid (VITAMIN C) 1000 MG tablet Take 1,000 mg by mouth daily.     atorvastatin (LIPITOR) 40 MG tablet take 1 tablet by mouth once daily AT 6 PM (Patient taking differently: Take 40 mg by mouth every evening.) 90 tablet 0   BIKTARVY 50-200-25 MG TABS tablet TAKE 1 TABLET BY MOUTH EVERY DAY (Patient taking differently: Take 1 tablet by mouth daily.) 90 tablet 3   cholecalciferol (VITAMIN D3) 25 MCG (1000 UNIT) tablet Take 1,000 Units by mouth daily.     cyanocobalamin (VITAMIN B12) 1000 MCG tablet TAKE 1 TABLET BY MOUTH EVERY DAY 90 tablet 1   dextromethorphan (DELSYM) 30 MG/5ML liquid Take 30 mg by mouth as needed for cough.      diclofenac (VOLTAREN) 75 MG EC tablet Take 1 tablet (75 mg total) by mouth 2 (two) times daily as needed. Do not take until finished with steroid taper 60 tablet 2   Ensure (ENSURE) Take 237 mLs by mouth 2 (two) times daily between meals. 5688 mL 5   escitalopram (LEXAPRO) 10 MG tablet Take 10 mg by mouth daily as needed (anxiety).     famotidine (PEPCID) 20 MG tablet Take 20 mg by mouth 2 (two) times  daily.     ferrous sulfate 325 (65 FE) MG EC tablet Take 1 tablet (325 mg total) by mouth in the morning and at bedtime. 180 tablet 0   Flaxseed, Linseed, (FLAX SEED OIL) 1000 MG CAPS Take 1,000 mg by mouth daily.     folic acid (FOLVITE) 1 MG tablet TAKE 1 TABLET BY MOUTH EVERY DAY 90 tablet 1   gabapentin (NEURONTIN) 300 MG capsule      hydrochlorothiazide (HYDRODIURIL) 25 MG tablet Take 25 mg by mouth daily.      HYDROcodone-acetaminophen (NORCO/VICODIN) 5-325 MG tablet Take 1 tablet by mouth 2 (two) times daily as needed.     isosorbide mononitrate (IMDUR) 60 MG 24 hr tablet Take 1 tablet (60 mg total) by mouth daily. 90 tablet 1   lisinopril (ZESTRIL) 40 MG tablet Take 40 mg by mouth daily.     loratadine (CLARITIN) 10 MG tablet 1 tablet     meclizine (ANTIVERT) 25 MG tablet Take 25 mg by mouth as needed for dizziness.  methocarbamol (ROBAXIN) 500 MG tablet Take 1 tablet (500 mg total) by mouth 2 (two) times daily as needed. 20 tablet 1   metoprolol succinate (TOPROL-XL) 50 MG 24 hr tablet Take 1 tablet (50 mg total) by mouth at bedtime. Take with or immediately following a meal. 90 tablet 3   Omega-3 Fatty Acids (FISH OIL) 1000 MG CPDR Take 1,000 mg by mouth daily.     ondansetron (ZOFRAN-ODT) 4 MG disintegrating tablet 1 tablet on the tongue and allow to dissolve     pantoprazole (PROTONIX) 40 MG tablet Take 1 tablet (40 mg total) by mouth daily. 90 tablet 0   predniSONE (STERAPRED UNI-PAK 21 TAB) 10 MG (21) TBPK tablet Take as directed 21 tablet 0   spironolactone (ALDACTONE) 25 MG tablet TAKE 1 TABLET BY MOUTH EVERY DAY (Patient taking differently: Take 25 mg by mouth daily.) 30 tablet 0   SYMBICORT 160-4.5 MCG/ACT inhaler Inhale 2 puffs into the lungs 2 (two) times daily.     vitamin A 10000 UNIT capsule Take 10,000 Units by mouth daily.     No current facility-administered medications for this visit.    Allergies:   Penicillins, Penicillin g, and Avelox [moxifloxacin hcl in  nacl]   Social History: Social History   Socioeconomic History   Marital status: Married    Spouse name: Not on file   Number of children: 6   Years of education: Not on file   Highest education level: Not on file  Occupational History   Occupation: retired  Tobacco Use   Smoking status: Every Day    Packs/day: 1.00    Years: 60.00    Total pack years: 60.00    Types: Cigarettes   Smokeless tobacco: Never   Tobacco comments:    Wants to quit. Using both the patch and gum.  Vaping Use   Vaping Use: Never used  Substance and Sexual Activity   Alcohol use: Yes    Alcohol/week: 10.0 standard drinks of alcohol    Types: 10 Glasses of wine per week    Comment: drinking less recently, denies h/o heavy use   Drug use: No   Sexual activity: Yes    Partners: Male    Birth control/protection: Surgical    Comment: pt. given condoms 08/23/20  Other Topics Concern   Not on file  Social History Narrative   Not on file   Social Determinants of Health   Financial Resource Strain: Not on file  Food Insecurity: Not on file  Transportation Needs: Not on file  Physical Activity: Not on file  Stress: Not on file  Social Connections: Not on file  Intimate Partner Violence: Not on file    Family History: Family History  Problem Relation Age of Onset   Diabetes Mother    Ovarian cancer Sister    Colon cancer Sister      Review of Systems: All other systems reviewed and are otherwise negative except as noted above.  Physical Exam: There were no vitals filed for this visit.   GEN- The patient is well appearing, alert and oriented x 3 today.   HEENT: normocephalic, atraumatic; sclera clear, conjunctiva pink; hearing intact; oropharynx clear; neck supple, no JVP Lymph- no cervical lymphadenopathy Lungs- Clear to ausculation bilaterally, normal work of breathing.  No wheezes, rales, rhonchi Heart- {Blank single:19197::"Regular","Irregularly irregular"}  rate and rhythm, no  murmurs, rubs or gallops, PMI not laterally displaced GI- soft, non-tender, non-distended, bowel sounds present, no hepatosplenomegaly Extremities- no clubbing or  cyanosis. {EDEMA RCVEL:38101} peripheral edema; DP/PT/radial pulses 2+ bilaterally MS- no significant deformity or atrophy Skin- warm and dry, no rash or lesion; PPM pocket well healed Psych- euthymic mood, full affect Neuro- strength and sensation are intact  PPM Interrogation-  reviewed in detail today,  See PACEART report.  EKG:  EKG {ACTION; IS/IS BPZ:02585277} ordered today. Personal review of ekg ordered {Blank single:19197::"today","***"} shows ***   Recent Labs: 10/07/2022: ALT 7; BUN 18; Creatinine 0.84; Hemoglobin 12.3; Platelet Count 207; Potassium 3.6; Sodium 140   Wt Readings from Last 3 Encounters:  10/07/22 124 lb 12.8 oz (56.6 kg)  02/22/22 129 lb (58.5 kg)  02/20/22 129 lb 12.8 oz (58.9 kg)     Other studies Reviewed: Additional studies/ records that were reviewed today include: Previous EP office notes, Previous remote checks, Most recent labwork.   ECHO COMPLETE WO IMAGING ENHANCING AGENT 10/31/2021 1. Left ventricular ejection fraction, by estimation, is 70 to 75%. The left ventricle has hyperdynamic function. The left ventricle has no regional wall motion abnormalities. There is severe concentric left ventricular hypertrophy. Left ventricular diastolic parameters are consistent with Grade I diastolic dysfunction (impaired relaxation). There is a mid intracavitary gradient present with peak gradient 23mHg. 2. Right ventricular systolic function is normal. The right ventricular size is normal. Mildly increased right ventricular wall thickness. There is normal pulmonary artery systolic pressure. The estimated right ventricular systolic pressure is 282.4mmHg. 3. Left atrial size was mildly dilated. 4. The mitral valve is abnormal. Trivial mitral valve regurgitation. 5. There is moderate calcification of the  aortic valve. There is moderate thickening of the aortic valve. Aortic valve regurgitation is not visualized. There is mild-to-moderte aortic stenosis with AVA 1.8, mean gradient 268mg, peak gradient 4034m, Vmax 3.73m/25mDI 0.5 6. The inferior vena cava is normal in size with greater than 50% respiratory variability, suggesting right atrial pressure of 3 mmHg.  Comparison(s): Compared to prior TTE in 10/2020, there continues to be mild-to-moderate aortic stenosis with mean gradient 20mm21mn current study (was previously 19mmH873m    GATED SPECT MYO PERF W/LEXISCAN STRESS 1D 02/22/2022 Low risk, abnormal stress nuclear study with prior apical/distal inferior infarct but no ischemia.  Gated ejection fraction 60% with mild hypokinesis of the distal inferior wall.    Assessment and Plan:  1. Symptomatic bradycardia s/p St. Jude PPM  Normal PPM function See Pace Art report No changes today  2. HTN Stable on current regimen   3. CAD *** Continue imdur Continue Toprol   Current medicines are reviewed at length with the patient today.    Labs/ tests ordered today include: *** No orders of the defined types were placed in this encounter.    Disposition:   Follow up with {EPPROVIDERS:28135} in {Blank single:19197::"2 weeks","4 weeks","3 months","6 months","12 months","as usual post gen change"}    Signed, MichaeShirley Friar  10/13/2022 2:30 PM  [facility] [place] [id number] [phone]) [phone]

## 2022-10-16 ENCOUNTER — Encounter: Payer: Medicare Other | Admitting: Student

## 2022-10-16 DIAGNOSIS — I495 Sick sinus syndrome: Secondary | ICD-10-CM

## 2022-10-16 DIAGNOSIS — I10 Essential (primary) hypertension: Secondary | ICD-10-CM

## 2022-10-16 DIAGNOSIS — R001 Bradycardia, unspecified: Secondary | ICD-10-CM

## 2022-10-16 DIAGNOSIS — I25118 Atherosclerotic heart disease of native coronary artery with other forms of angina pectoris: Secondary | ICD-10-CM

## 2022-10-17 ENCOUNTER — Encounter: Payer: Medicaid Other | Admitting: Obstetrics and Gynecology

## 2022-10-17 ENCOUNTER — Other Ambulatory Visit: Payer: Self-pay | Admitting: Family

## 2022-10-17 ENCOUNTER — Telehealth: Payer: Self-pay

## 2022-10-17 DIAGNOSIS — E43 Unspecified severe protein-calorie malnutrition: Secondary | ICD-10-CM

## 2022-10-17 MED ORDER — ENSURE PO LIQD: 237.0000 mL | Freq: Two times a day (BID) | ORAL | 5 refills | Status: DC

## 2022-10-17 NOTE — Progress Notes (Deleted)
    GYNECOLOGY PROGRESS NOTE  Subjective:    Patient ID: Mahek Schlesinger, female    DOB: 10-04-1946, 76 y.o.   MRN: [id number]  HPI  Patient is a 76 y.o. [id number] female who presents for pap smear only. She was referred by Jimmey Ralph, MD for LGSIL on pap. No results found in epic.  {Common ambulatory SmartLinks:19316}  Review of Systems {ros; complete:30496}   Objective:   There were no vitals taken for this visit. There is no height or weight on file to calculate BMI. General appearance: {general exam:16600} Abdomen: {abdominal exam:16834} Pelvic: {pelvic exam:16852::"cervix normal in appearance","external genitalia normal","no adnexal masses or tenderness","no cervical motion tenderness","rectovaginal septum normal","uterus normal size, shape, and consistency","vagina normal without discharge"} Extremities: {extremity exam:5109} Neurologic: {neuro exam:17854}   Assessment:   1. LGSIL Pap smear of vagina      Plan:   There are no diagnoses linked to this encounter.    Darliss Cheney, MD [place]

## 2022-10-17 NOTE — Telephone Encounter (Signed)
Faxed Ensure RX to caseworker at [facility]. Reminded patient will need to be scheduled with provider here before any additional RX is written or need to call Dr Johnnye Sima office to follow with that office.

## 2022-11-08 ENCOUNTER — Other Ambulatory Visit: Payer: Medicare Other

## 2022-11-13 ENCOUNTER — Ambulatory Visit (INDEPENDENT_AMBULATORY_CARE_PROVIDER_SITE_OTHER): Payer: Medicare Other | Admitting: Infectious Diseases

## 2022-11-13 ENCOUNTER — Encounter: Payer: Self-pay | Admitting: Infectious Diseases

## 2022-11-13 VITALS — BP 124/61 | HR 54 | Temp 97.7°F | Wt 125.0 lb

## 2022-11-13 DIAGNOSIS — F17200 Nicotine dependence, unspecified, uncomplicated: Secondary | ICD-10-CM

## 2022-11-13 DIAGNOSIS — J411 Mucopurulent chronic bronchitis: Secondary | ICD-10-CM | POA: Diagnosis not present

## 2022-11-13 DIAGNOSIS — I10 Essential (primary) hypertension: Secondary | ICD-10-CM | POA: Diagnosis not present

## 2022-11-13 DIAGNOSIS — F17210 Nicotine dependence, cigarettes, uncomplicated: Secondary | ICD-10-CM | POA: Diagnosis not present

## 2022-11-13 DIAGNOSIS — B20 Human immunodeficiency virus [HIV] disease: Secondary | ICD-10-CM

## 2022-11-13 DIAGNOSIS — R87622 Low grade squamous intraepithelial lesion on cytologic smear of vagina (LGSIL): Secondary | ICD-10-CM

## 2022-11-13 DIAGNOSIS — D649 Anemia, unspecified: Secondary | ICD-10-CM

## 2022-11-13 DIAGNOSIS — Z113 Encounter for screening for infections with a predominantly sexual mode of transmission: Secondary | ICD-10-CM

## 2022-11-13 DIAGNOSIS — I350 Nonrheumatic aortic (valve) stenosis: Secondary | ICD-10-CM | POA: Diagnosis not present

## 2022-11-13 MED ORDER — DOXYCYCLINE MONOHYDRATE 100 MG PO TABS: 100.0000 mg | ORAL_TABLET | Freq: Two times a day (BID) | ORAL | 0 refills | Status: AC

## 2022-11-13 NOTE — Assessment & Plan Note (Signed)
Appreciate CV f/u.

## 2022-11-13 NOTE — Assessment & Plan Note (Signed)
Well controlled today.

## 2022-11-13 NOTE — Assessment & Plan Note (Signed)
Encouraged to quit. 

## 2022-11-13 NOTE — Assessment & Plan Note (Signed)
Appreciate heme f/u.

## 2022-11-13 NOTE — Assessment & Plan Note (Signed)
She is doing well Will repeat her labs today at her request.  Will see her back in 3-4 months.

## 2022-11-13 NOTE — Assessment & Plan Note (Addendum)
She has quit taking these.  Will have her seen GYN to discuss her hx of LGSIL.

## 2022-11-13 NOTE — Progress Notes (Signed)
Subjective:    Patient ID: Nicole Mccormick, female  DOB: [birth year], 76 y.o.        MRN: [id number]   HPI 76 yo F with hx of HTN and HIV+. She is also long term smoker.  She had episodes of bradycardia, and had pacer placed 02-05-18.  Was changed to biktarvy 06-2018 from tivicay-descovey. No problems with her ART.  Has still been having joint pains. Mostly in her lower back. 05-2021 1. Right convex scoliosis. 2. Spondylosis and facet hypertrophy at L4-5 and L5-S1. Still having lower back pain. Interfering with her ability to ambulate, occas falls.    Now living in single story dwelling.    She was in hospital June 2021 with anemia- she had colonoscopy (4-5 polyps, hemmeroids) and EGD (avm).  She still has GI f/u as well as Heme f/u for iron infusions.    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   Has had a bad cold for the last 3 days which "I can't shake". Has tried multiple OTC.  Lot's of mucous, cough. Occas chills at night, no fever.    HIV 1 RNA Quant  Date Value  08/17/2021 Not Detected Copies/mL  08/23/2020 <20 DETECTED copies/mL  07/06/2018 <20 NOT DETECTED copies/mL   CD4 T Cell Abs (/uL)  Date Value  08/23/2020 395 (L)  06/05/2020 389 (L)  07/06/2018 400     Health Maintenance  Topic Date Due   Medicare Annual Wellness (AWV)  Never done   Zoster Vaccines- Shingrix (1 of 2) Never done   DEXA SCAN  Never done   Lung Cancer Screening  12/30/2019   INFLUENZA VACCINE  07/09/2022   COVID-19 Vaccine (4 - 2023-24 season) 08/09/2022   DTaP/Tdap/Td (2 - Td or Tdap) 02/27/2023   COLONOSCOPY (Pts 45-4yr Insurance coverage will need to be confirmed)  06/06/2027   Pneumonia Vaccine 76 Years old  Completed   Hepatitis C Screening  Completed   HPV VACCINES  Aged Out      Review of Systems  Constitutional:  Positive for chills. Negative for fever and weight loss.  HENT:  Positive for congestion.   Respiratory:  Positive for cough and  sputum production.   Cardiovascular:  Negative for palpitations.  Gastrointestinal:  Negative for constipation and diarrhea.  Genitourinary:  Negative for dysuria.  Musculoskeletal:  Positive for back pain.    Please see HPI. All other systems reviewed and negative.     Objective:  Physical Exam Vitals reviewed.  Constitutional:      Appearance: Normal appearance.  HENT:     Mouth/Throat:     Mouth: Mucous membranes are moist.     Pharynx: No oropharyngeal exudate.  Eyes:     Extraocular Movements: Extraocular movements intact.     Pupils: Pupils are equal, round, and reactive to light.  Cardiovascular:     Rate and Rhythm: Normal rate and regular rhythm.     Heart sounds: Murmur heard.  Pulmonary:     Effort: Pulmonary effort is normal.     Breath sounds: Rhonchi present.  Abdominal:     General: Bowel sounds are normal. There is no distension.     Palpations: Abdomen is soft.  Musculoskeletal:     Cervical back: Normal range of motion and neck supple.     Right lower leg: No edema.     Left lower leg: No edema.  Neurological:     Mental Status: She is alert.  Assessment & Plan:

## 2022-11-13 NOTE — Assessment & Plan Note (Signed)
Will give her a rx for doxy She is Pen allergic (severe), and flq allergic (as well as her CV hx).  She has been getting some relief from her inhalers. Albuterol She is still using her symbicort.  She will get RSV vax and flu vax today.

## 2022-11-14 ENCOUNTER — Telehealth: Payer: Self-pay | Admitting: *Deleted

## 2022-11-14 LAB — T-HELPER CELLS (CD4) COUNT (NOT AT ARMC)
CD4 % Helper T Cell: 32 % — ABNORMAL LOW (ref 33–65)
CD4 T Cell Abs: 422 /uL (ref 400–1790)

## 2022-11-14 NOTE — Telephone Encounter (Signed)
Received fax from [place] [facility] -  critical value Iron saturation is 5; labs done 11/13/22. Thanks

## 2022-11-15 LAB — COMPREHENSIVE METABOLIC PANEL
ALT: 11 IU/L (ref 0–32)
AST: 17 IU/L (ref 0–40)
Albumin/Globulin Ratio: 1.4 (ref 1.2–2.2)
Albumin: 4.2 g/dL (ref 3.8–4.8)
Alkaline Phosphatase: 92 IU/L (ref 44–121)
BUN/Creatinine Ratio: 15 (ref 12–28)
BUN: 21 mg/dL (ref 8–27)
Bilirubin Total: <0.2 mg/dL (ref 0.0–1.2)
CO2: 22 mmol/L (ref 20–29)
Calcium: 9.2 mg/dL (ref 8.7–10.3)
Chloride: 102 mmol/L (ref 96–106)
Creatinine, Ser: 1.42 mg/dL — ABNORMAL HIGH (ref 0.57–1.00)
Globulin, Total: 3.1 g/dL (ref 1.5–4.5)
Glucose: 106 mg/dL — ABNORMAL HIGH (ref 70–99)
Potassium: 4 mmol/L (ref 3.5–5.2)
Sodium: 141 mmol/L (ref 134–144)
Total Protein: 7.3 g/dL (ref 6.0–8.5)
eGFR: 38 mL/min/{1.73_m2} — ABNORMAL LOW (ref >59)

## 2022-11-15 LAB — CBC
Hematocrit: 37.8 % (ref 34.0–46.6)
Hemoglobin: 12 g/dL (ref 11.1–15.9)
MCH: 31.3 pg (ref 26.6–33.0)
MCHC: 31.7 g/dL (ref 31.5–35.7)
MCV: 98 fL — ABNORMAL HIGH (ref 79–97)
Platelets: 237 10*3/uL (ref 150–450)
RBC: 3.84 x10E6/uL (ref 3.77–5.28)
RDW: 14.5 % (ref 11.7–15.4)
WBC: 4.7 10*3/uL (ref 3.4–10.8)

## 2022-11-15 LAB — HIV-1 RNA QUANT-NO REFLEX-BLD: HIV-1 RNA Viral Load: <20 copies/mL

## 2022-11-15 LAB — IRON,TIBC AND FERRITIN PANEL
Ferritin: 19 ng/mL (ref 15–150)
Iron Saturation: 5 % — CL (ref 15–55)
Iron: 20 ug/dL — ABNORMAL LOW (ref 27–139)
Total Iron Binding Capacity: 404 ug/dL (ref 250–450)
UIBC: 384 ug/dL — ABNORMAL HIGH (ref 118–369)

## 2022-11-15 LAB — RPR: RPR Ser Ql: NONREACTIVE

## 2022-11-20 ENCOUNTER — Ambulatory Visit (INDEPENDENT_AMBULATORY_CARE_PROVIDER_SITE_OTHER): Payer: Medicare Other

## 2022-11-20 DIAGNOSIS — I495 Sick sinus syndrome: Secondary | ICD-10-CM | POA: Diagnosis not present

## 2022-11-20 LAB — CUP PACEART REMOTE DEVICE CHECK
Battery Remaining Longevity: 64 mo
Battery Remaining Percentage: 55 %
Battery Voltage: 2.99 V
Brady Statistic AP VP Percent: 1.4 %
Brady Statistic AP VS Percent: 96 %
Brady Statistic AS VP Percent: 1 %
Brady Statistic AS VS Percent: 2.4 %
Brady Statistic RA Percent Paced: 96 %
Brady Statistic RV Percent Paced: 1.4 %
Date Time Interrogation Session: 20231213020016
Implantable Lead Connection Status: 753985
Implantable Lead Connection Status: 753985
Implantable Lead Implant Date: 20190228
Implantable Lead Implant Date: 20190228
Implantable Lead Location: 753859
Implantable Lead Location: 753860
Implantable Pulse Generator Implant Date: 20190228
Lead Channel Impedance Value: 350 Ohm
Lead Channel Impedance Value: 450 Ohm
Lead Channel Pacing Threshold Amplitude: 0.5 V
Lead Channel Pacing Threshold Amplitude: 0.625 V
Lead Channel Pacing Threshold Pulse Width: 0.5 ms
Lead Channel Pacing Threshold Pulse Width: 0.5 ms
Lead Channel Sensing Intrinsic Amplitude: 5 mV
Lead Channel Sensing Intrinsic Amplitude: 9.6 mV
Lead Channel Setting Pacing Amplitude: 0.875
Lead Channel Setting Pacing Amplitude: 1.5 V
Lead Channel Setting Pacing Pulse Width: 0.5 ms
Lead Channel Setting Sensing Sensitivity: 2 mV
Pulse Gen Model: 2272
Pulse Gen Serial Number: 8996997

## 2022-12-17 NOTE — Progress Notes (Signed)
Remote pacemaker transmission.   

## 2023-01-13 ENCOUNTER — Encounter: Payer: Self-pay | Admitting: Hematology and Oncology

## 2023-01-17 ENCOUNTER — Telehealth: Payer: Self-pay | Admitting: Hematology and Oncology

## 2023-01-17 NOTE — Telephone Encounter (Signed)
Per Dorsey's schedule on 04/08/23, reschedule pt left voicemail for pt to call back to confirm or change.

## 2023-02-05 ENCOUNTER — Other Ambulatory Visit: Payer: Self-pay | Admitting: Hematology and Oncology

## 2023-02-11 ENCOUNTER — Other Ambulatory Visit: Payer: Self-pay | Admitting: Infectious Diseases

## 2023-02-11 DIAGNOSIS — B20 Human immunodeficiency virus [HIV] disease: Secondary | ICD-10-CM

## 2023-02-12 ENCOUNTER — Encounter: Payer: 59 | Admitting: Infectious Diseases

## 2023-02-19 ENCOUNTER — Ambulatory Visit (INDEPENDENT_AMBULATORY_CARE_PROVIDER_SITE_OTHER): Payer: 59

## 2023-02-19 DIAGNOSIS — I495 Sick sinus syndrome: Secondary | ICD-10-CM | POA: Diagnosis not present

## 2023-02-21 LAB — CUP PACEART REMOTE DEVICE CHECK
Battery Remaining Longevity: 60 mo
Battery Remaining Percentage: 52 %
Battery Voltage: 2.99 V
Brady Statistic AP VP Percent: 1.4 %
Brady Statistic AP VS Percent: 96 %
Brady Statistic AS VP Percent: 1 %
Brady Statistic AS VS Percent: 2 %
Brady Statistic RA Percent Paced: 96 %
Brady Statistic RV Percent Paced: 1.4 %
Date Time Interrogation Session: 20240313232353
Implantable Lead Connection Status: 753985
Implantable Lead Connection Status: 753985
Implantable Lead Implant Date: 20190228
Implantable Lead Implant Date: 20190228
Implantable Lead Location: 753859
Implantable Lead Location: 753860
Implantable Pulse Generator Implant Date: 20190228
Lead Channel Impedance Value: 330 Ohm
Lead Channel Impedance Value: 450 Ohm
Lead Channel Pacing Threshold Amplitude: 0.375 V
Lead Channel Pacing Threshold Amplitude: 0.625 V
Lead Channel Pacing Threshold Pulse Width: 0.5 ms
Lead Channel Pacing Threshold Pulse Width: 0.5 ms
Lead Channel Sensing Intrinsic Amplitude: 4.1 mV
Lead Channel Sensing Intrinsic Amplitude: 8.5 mV
Lead Channel Setting Pacing Amplitude: 0.875
Lead Channel Setting Pacing Amplitude: 1.375
Lead Channel Setting Pacing Pulse Width: 0.5 ms
Lead Channel Setting Sensing Sensitivity: 2 mV
Pulse Gen Model: 2272
Pulse Gen Serial Number: 8996997

## 2023-02-24 ENCOUNTER — Other Ambulatory Visit: Payer: Self-pay | Admitting: Student

## 2023-02-26 ENCOUNTER — Encounter: Payer: 59 | Admitting: Infectious Diseases

## 2023-02-27 NOTE — Progress Notes (Deleted)
  Electrophysiology Office Note:   Date:  02/27/2023  ID:  Nicole Mccormick, DOB [birth year], MRN [id number]  Primary Cardiologist: None Electrophysiologist: Melida Quitter, MD   History of Present Illness:   Nicole Mccormick is a 77 y.o. female with h/o CAD, HIV, Aortic stenosis, HTN, symptomatic bradycardia s/p St Jude PPM for routine electrophysiology followup. Since last being seen in our clinic the patient reports doing ***.  she denies chest pain, palpitations, dyspnea, PND, orthopnea, nausea, vomiting, dizziness, syncope, edema, weight gain, or early satiety.   Review of systems complete and found to be negative unless listed in HPI.   Device History: St. Jude Dual Chamber PPM implanted 01/2018 for symptomatic bradycardia   Studies Reviewed:    PPM Interrogation-  reviewed in detail today,  See PACEART report.  EKG is ordered today. Personal review shows ***  {Select studies to display:26339}  Risk Assessment/Calculations:   {Does this patient have ATRIAL FIBRILLATION?:[id number]} No BP recorded.  {Refresh Note OR Click here to enter BP  :1}***        Physical Exam:   VS:  There were no vitals taken for this visit.   Wt Readings from Last 3 Encounters:  11/13/22 125 lb (56.7 kg)  10/07/22 124 lb 12.8 oz (56.6 kg)  02/22/22 129 lb (58.5 kg)     GEN: Well nourished, well developed in no acute distress NECK: No JVD; No carotid bruits CARDIAC: {EPRHYTHM:28826}, no murmurs, rubs, gallops RESPIRATORY:  Clear to auscultation without rales, wheezing or rhonchi  ABDOMEN: Soft, non-tender, non-distended EXTREMITIES:  No edema; No deformity   ASSESSMENT AND PLAN:     Symptomatic bradycardia s/p Abbott PPM  Normal PPM function See Pace Art report No changes today  HTN Stable on current regimen   CAD No s/s ischemia  Continue imdur  {Click here to Review PMH, Prob List, Meds, Allergies, SHx, FHx  :1}   Disposition:   Follow up with {EPPROVIDERS:28135} {EPFOLLOW  UP:28173}  Signed, Shirley Friar, PA-C

## 2023-02-28 ENCOUNTER — Ambulatory Visit: Payer: 59 | Attending: Student | Admitting: Student

## 2023-02-28 DIAGNOSIS — I10 Essential (primary) hypertension: Secondary | ICD-10-CM

## 2023-02-28 DIAGNOSIS — R001 Bradycardia, unspecified: Secondary | ICD-10-CM

## 2023-02-28 DIAGNOSIS — I25118 Atherosclerotic heart disease of native coronary artery with other forms of angina pectoris: Secondary | ICD-10-CM

## 2023-03-11 NOTE — Progress Notes (Deleted)
  Electrophysiology Office Note:   Date:  03/11/2023  ID:  Nicole Mccormick, DOB Mar 27, 1946, MRN [id number]  Primary Cardiologist: None Electrophysiologist: Melida Quitter, MD   History of Present Illness:   Nicole Mccormick is a 77 y.o. female with h/o symptomatic bradycardia s/p PPM, HTN, CAD, HIV+, Hep C, HLD, and arthritis seen today for routine electrophysiology followup. Since last being seen in our clinic the patient reports doing ***.  she denies chest pain, palpitations, dyspnea, PND, orthopnea, nausea, vomiting, dizziness, syncope, edema, weight gain, or early satiety.   Review of systems complete and found to be negative unless listed in HPI.   Device History: St. Jude Dual Chamber PPM implanted 01/2018 for symptomatic bradycardia   Studies Reviewed:    PPM Interrogation-  reviewed in detail today,  See PACEART report.  EKG is ordered today. Personal review shows ***   Risk Assessment/Calculations:   {Does this patient have ATRIAL FIBRILLATION?:[id number]} No BP recorded.  {Refresh Note OR Click here to enter BP  :1}***        Physical Exam:   VS:  There were no vitals taken for this visit.   Wt Readings from Last 3 Encounters:  11/13/22 125 lb (56.7 kg)  10/07/22 124 lb 12.8 oz (56.6 kg)  02/22/22 129 lb (58.5 kg)     GEN: Well nourished, well developed in no acute distress NECK: No JVD; No carotid bruits CARDIAC: {EPRHYTHM:28826}, no murmurs, rubs, gallops RESPIRATORY:  Clear to auscultation without rales, wheezing or rhonchi  ABDOMEN: Soft, non-tender, non-distended EXTREMITIES:  No edema; No deformity   ASSESSMENT AND PLAN:    Symptomatic bradycardia s/p Abbott PPM  Normal PPM function See Pace Art report No changes today  HTN Stable on current regimen   CAD with stable angina Myoview 02/2022 with normal EF and no ischemia Continue imdur.  Continue toprol Continue statin  {Click here to Review PMH, Prob List, Meds, Allergies, SHx, FHx  :1}   Disposition:    Follow up with {EPPROVIDERS:28135} {EPFOLLOW UP:28173}  Signed, Shirley Friar, PA-C

## 2023-03-12 ENCOUNTER — Encounter: Payer: 59 | Admitting: Obstetrics & Gynecology

## 2023-03-12 ENCOUNTER — Ambulatory Visit: Payer: 59 | Admitting: Student

## 2023-03-12 DIAGNOSIS — I25118 Atherosclerotic heart disease of native coronary artery with other forms of angina pectoris: Secondary | ICD-10-CM

## 2023-03-12 DIAGNOSIS — R001 Bradycardia, unspecified: Secondary | ICD-10-CM

## 2023-03-12 DIAGNOSIS — I10 Essential (primary) hypertension: Secondary | ICD-10-CM

## 2023-03-12 DIAGNOSIS — I495 Sick sinus syndrome: Secondary | ICD-10-CM

## 2023-03-13 ENCOUNTER — Telehealth: Payer: Self-pay

## 2023-03-13 DIAGNOSIS — E43 Unspecified severe protein-calorie malnutrition: Secondary | ICD-10-CM

## 2023-03-13 NOTE — Telephone Encounter (Signed)
Nicole Mccormick from from [facility] called she is requesting a refill for ensure.  Call back @ [phone] Fax:@ [phone]

## 2023-03-14 ENCOUNTER — Other Ambulatory Visit: Payer: Self-pay | Admitting: Infectious Diseases

## 2023-03-14 DIAGNOSIS — E43 Unspecified severe protein-calorie malnutrition: Secondary | ICD-10-CM

## 2023-03-14 MED ORDER — ENSURE PO LIQD: 237.0000 mL | Freq: Two times a day (BID) | ORAL | 5 refills | Status: DC

## 2023-03-16 NOTE — Progress Notes (Unsigned)
Cardiology Office Note Date:  03/18/2023  Patient ID:  Nicole Mccormick, Nicole Mccormick 1946/09/18, MRN [id number] PCP:  Raymon Mutton., FNP  Electrophysiologist: Dr. Johney Frame (ret)    Chief Complaint:  overdue  History of Present Illness: Nicole Mccormick is a 77 y.o. female with history of HTN, seizure d/o, Hep C, symptomatic bradycardia w/PPM, VHD w/AS, IDA follows with hematology  Saw Dr. Johney Frame vis televisit 2021, doing well, planned for annual visit  Saw Mardelle Matte 02/20/22, c/o some CP and dizziness (sounded like vertigo) planned for ischemic eval, coreg changed to toprol.   Stress test was low risk, no ischemia, evidence of prior apical/distal infarct, preserved LVEF  TODAY She is doing well Reports herself as very active with good exertional capacity. Occasionally she gets a "catch" in her chest, causes her to reflexively take a breath in, last no more then 2-3 seconds.  No pattern or trigger No CP otherwise, no palpitations, cardiac awareness No SOB No near syncope or syncope.  She has a long med list, says she gets her meds in pill packs and has good medication compliance   Device information Abbott dual chamber PPM implanted 02/05/2018   Past Medical History:  Diagnosis Date   Abnormal Pap smear    Aortic stenosis    TTE 2021 with mean gradient   Arthritis    Asthma    AVM (arteriovenous malformation)    Bradycardia    Complication of anesthesia    "they have a hard time bringing me back" (11/08/2015)   Coronary artery disease    High grade RCA disease; diagnosed during acute GIB; on medical therapy   Dynamic left ventricular outflow obstruction    GERD (gastroesophageal reflux disease)    previously on aciphex, discontinued november 2012  because patient asymptomatic, and concern about interference with HIV meds.  May try pepcid in the future if symptoms return   Heart murmur    Hepatitis C antibody test positive    HIV infection    diagnosed before 2008   Hyperlipidemia  09/12/2021   Hypertension    Hypertensive heart disease    Iron deficiency anemia    Ferritin = 2 in november 2012, started on iron supplemenation   Pacemaker    a. [employer] Jude PPM 01/2018.   Seizures    Symptomatic anemia 08/24/2020   Varicosities     Past Surgical History:  Procedure Laterality Date   ABDOMINAL HYSTERECTOMY     CARDIAC CATHETERIZATION N/A 11/08/2015   Procedure: Left Heart Cath and Coronary Angiography;  Surgeon: Corky Crafts, MD;  Location: [facility] INVASIVE CV LAB;  Service: Cardiovascular;  Laterality: N/A;   COLONOSCOPY  10/13/11   small adenoma, anal condyloma   COLONOSCOPY  10/13/2011   Procedure: COLONOSCOPY;  Surgeon: Iva Boop, MD;  Location: [facility] ENDOSCOPY;  Service: Endoscopy;  Laterality: N/A;   COLONOSCOPY N/A 09/14/2014   Procedure: COLONOSCOPY;  Surgeon: Willis Modena, MD;  Location: [facility] ENDOSCOPY;  Service: Endoscopy;  Laterality: N/A;   COLONOSCOPY WITH PROPOFOL N/A 06/05/2020   Procedure: COLONOSCOPY WITH PROPOFOL;  Surgeon: Willis Modena, MD;  Location: [facility] ENDOSCOPY;  Service: Endoscopy;  Laterality: N/A;   ENTEROSCOPY N/A 10/31/2020   Procedure: ENTEROSCOPY;  Surgeon: Kathi Der, MD;  Location: [facility] ENDOSCOPY;  Service: Gastroenterology;  Laterality: N/A;   ESOPHAGOGASTRODUODENOSCOPY  10/13/11   small hiatal hernia   ESOPHAGOGASTRODUODENOSCOPY  10/13/2011   Procedure: ESOPHAGOGASTRODUODENOSCOPY (EGD);  Surgeon: Iva Boop, MD;  Location: [facility] ENDOSCOPY;  Service:  Endoscopy;  Laterality: N/A;   ESOPHAGOGASTRODUODENOSCOPY N/A 09/14/2014   Procedure: ESOPHAGOGASTRODUODENOSCOPY (EGD);  Surgeon: Willis ModenaWilliam Outlaw, MD;  [facility];  Service: Endoscopy;  Laterality: N/A;   ESOPHAGOGASTRODUODENOSCOPY (EGD) WITH PROPOFOL N/A 06/05/2020   Procedure: ESOPHAGOGASTRODUODENOSCOPY (EGD) WITH PROPOFOL;  Surgeon: Willis Modenautlaw, William, MD;  [facility];  Service: Endoscopy;  Laterality: N/A;   GIVENS CAPSULE STUDY N/A 09/14/2014   Procedure:  GIVENS CAPSULE STUDY;  Surgeon: Willis ModenaWilliam Outlaw, MD;  [facility];  Service: Endoscopy;  Laterality: N/A;   GIVENS CAPSULE STUDY N/A 08/26/2020   Procedure: GIVENS CAPSULE STUDY;  Surgeon: Graylin ShiverGanem, Salem F, MD;  [facility];  Service: Endoscopy;  Laterality: N/A;   HOT HEMOSTASIS N/A 10/31/2020   Procedure: HOT HEMOSTASIS (ARGON PLASMA COAGULATION/BICAP);  Surgeon: Kathi DerBrahmbhatt, Parag, MD;  [facility];  Service: Gastroenterology;  Laterality: N/A;   PACEMAKER IMPLANT N/A 02/05/2018   Procedure: PACEMAKER IMPLANT;  Surgeon: Hillis RangeAllred, James, MD;  Location: [facility] INVASIVE CV LAB;  Service: Cardiovascular;  Laterality: N/A;   POLYPECTOMY  06/05/2020   Procedure: POLYPECTOMY;  Surgeon: Willis Modenautlaw, William, MD;  Location: [facility] ENDOSCOPY;  Service: Endoscopy;;    Current Outpatient Medications  Medication Sig Dispense Refill   acyclovir (ZOVIRAX) 400 MG tablet Take 1 tablet (400 mg total) by mouth daily as needed (for flare ups). 30 tablet 3   albuterol (PROAIR HFA) 108 (90 Base) MCG/ACT inhaler INHALE 2 PUFFS BY MOUTH EVERY 4 HOURS IF NEEDED FOR WHEEZING OR SHORTNESS OF BREATH (Patient taking differently: Inhale 2 puffs into the lungs every 4 (four) hours as needed for wheezing or shortness of breath.) 8.5 g 0   amLODipine (NORVASC) 10 MG tablet TAKE 1 TABLET BY MOUTH EVERY DAY (Patient taking differently: Take 10 mg by mouth daily.) 90 tablet 0   Ascorbic Acid (VITAMIN C) 1000 MG tablet Take 1,000 mg by mouth daily.     atorvastatin (LIPITOR) 40 MG tablet take 1 tablet by mouth once daily AT 6 PM (Patient taking differently: Take 40 mg by mouth every evening.) 90 tablet 0   BIKTARVY 50-200-25 MG TABS tablet TAKE 1 TABLET BY MOUTH EVERY DAY (Patient taking differently: Take 1 tablet by mouth daily.) 90 tablet 3   cholecalciferol (VITAMIN D3) 25 MCG (1000 UNIT) tablet Take 1,000 Units by mouth daily.     cyanocobalamin (VITAMIN B12) 1000 MCG tablet TAKE 1 TABLET BY MOUTH EVERY DAY 90  tablet 1   dextromethorphan (DELSYM) 30 MG/5ML liquid Take 30 mg by mouth as needed for cough.      diclofenac (VOLTAREN) 75 MG EC tablet Take 1 tablet (75 mg total) by mouth 2 (two) times daily as needed. Do not take until finished with steroid taper 60 tablet 2   Ensure (ENSURE) Take 237 mLs by mouth 2 (two) times daily between meals. 5688 mL 5   escitalopram (LEXAPRO) 10 MG tablet Take 10 mg by mouth daily as needed (anxiety).     famotidine (PEPCID) 20 MG tablet Take 20 mg by mouth 2 (two) times daily.     Flaxseed, Linseed, (FLAX SEED OIL) 1000 MG CAPS Take 1,000 mg by mouth daily.     folic acid (FOLVITE) 1 MG tablet TAKE 1 TABLET BY MOUTH EVERY DAY 90 tablet 1   gabapentin (NEURONTIN) 300 MG capsule Take 300 mg by mouth 2 (two) times daily.     hydrochlorothiazide (HYDRODIURIL) 25 MG tablet Take 25 mg by mouth daily.      HYDROcodone-acetaminophen (NORCO/VICODIN) 5-325 MG tablet Take  1 tablet by mouth 2 (two) times daily as needed.     isosorbide mononitrate (IMDUR) 60 MG 24 hr tablet Take 1 tablet (60 mg total) by mouth daily. 90 tablet 1   lisinopril (ZESTRIL) 40 MG tablet Take 40 mg by mouth daily.     loratadine (CLARITIN) 10 MG tablet Take 10 mg by mouth daily.     meclizine (ANTIVERT) 25 MG tablet Take 25 mg by mouth as needed for dizziness.      methocarbamol (ROBAXIN) 500 MG tablet Take 1 tablet (500 mg total) by mouth 2 (two) times daily as needed. 20 tablet 1   metoprolol succinate (TOPROL-XL) 50 MG 24 hr tablet TAKE 1 TABLET BY MOUTH AT BEDTIME 90 tablet 0   Omega-3 Fatty Acids (FISH OIL) 1000 MG CPDR Take 1,000 mg by mouth daily.     ondansetron (ZOFRAN-ODT) 4 MG disintegrating tablet 1 tablet on the tongue and allow to dissolve     pantoprazole (PROTONIX) 40 MG tablet Take 1 tablet (40 mg total) by mouth daily. 90 tablet 0   spironolactone (ALDACTONE) 25 MG tablet TAKE 1 TABLET BY MOUTH EVERY DAY (Patient taking differently: Take 25 mg by mouth daily.) 30 tablet 0   SYMBICORT  160-4.5 MCG/ACT inhaler Inhale 2 puffs into the lungs 2 (two) times daily.     vitamin A 16109 UNIT capsule Take 10,000 Units by mouth daily.     ferrous sulfate 325 (65 FE) MG EC tablet Take 1 tablet (325 mg total) by mouth in the morning and at bedtime. 180 tablet 0   No current facility-administered medications for this visit.    Allergies:   Penicillins, Penicillin g, and Avelox [moxifloxacin hcl in nacl]   Social History:  The patient  reports that she has been smoking cigarettes. She has a 60.00 pack-year smoking history. She has never used smokeless tobacco. She reports current alcohol use of about 10.0 standard drinks of alcohol per week. She reports that she does not use drugs.   Family History:  The patient's family history includes Colon cancer in her sister; Diabetes in her mother; Ovarian cancer in her sister.  ROS:  Please see the history of present illness.    All other systems are reviewed and otherwise negative.   PHYSICAL EXAM:  VS:  BP 132/70   Pulse 60   Ht 5\' 2"  (1.575 m)   Wt 124 lb 9.6 oz (56.5 kg)   SpO2 97%   BMI 22.79 kg/m  BMI: Body mass index is 22.79 kg/m. Well nourished, well developed, in no acute distress HEENT: normocephalic, atraumatic Neck: no JVD, carotid bruits or masses Cardiac:  RRR; loud AS murmur, no rubs, or gallops Lungs:  CTA b/l, no wheezing, rhonchi or rales Abd: soft, nontender MS: no deformity or atrophy Ext: no edema Skin: warm and dry, no rash Neuro:  No gross deficits appreciated Psych: euthymic mood, full affect  PPM site is stable, no tethering or discomfort   EKG:  Done today and reviewed by myself shows  AP/VS 60bpm  Device interrogation done today and reviewed by myself:  Battery and lead measurements are good No P waves at 40 No arrhythmias There is 23 listed AMS episodes with no EGMs, unable to pull up any EGMs including old. Longest episode 14 seconds 11/06/22   02/22/22: stress myoview  Findings are  consistent with prior myocardial infarction. The study is low risk.   No ST deviation was noted.   Left ventricular function is  normal. Nuclear stress EF: 60 %. The left ventricular ejection fraction is normal (55-65%). End diastolic cavity size is normal.   Prior study not available for comparison.   Low risk, abnormal stress nuclear study with prior apical/distal inferior infarct but no ischemia.  Gated ejection fraction 60% with mild hypokinesis of the distal inferior wall.   10/31/21: TTE 1. Left ventricular ejection fraction, by estimation, is 70 to 75%. The  left ventricle has hyperdynamic function. The left ventricle has no  regional wall motion abnormalities. There is severe concentric left  ventricular hypertrophy. Left ventricular  diastolic parameters are consistent with Grade I diastolic dysfunction  (impaired relaxation). There is a mid intracavitary gradient present with  peak gradient .   2. Right ventricular systolic function is normal. The right ventricular  size is normal. Mildly increased right ventricular wall thickness. There  is normal pulmonary artery systolic pressure. The estimated right  ventricular systolic pressure is 29.4  mmHg.   3. Left atrial size was mildly dilated.   4. The mitral valve is abnormal. Trivial mitral valve regurgitation.   5. There is moderate calcification of the aortic valve. There is moderate  thickening of the aortic valve. Aortic valve regurgitation is not  visualized. There is mild-to-moderte aortic stenosis with AVA 1.8, mean  gradient , peak gradient ,  Vmax 3.1m/s, DI 0.5   6. The inferior vena cava is normal in size with greater than 50%  respiratory variability, suggesting right atrial pressure of 3 mmHg.   Comparison(s): Compared to prior TTE in 10/2020, there continues to be  mild-to-moderate aortic stenosis with mean gradient on current  study (was previously ).     Recent  Labs: 11/13/2022: ALT 11; BUN 21; Creatinine, Ser 1.42; Hemoglobin 12.0; Platelets 237; Potassium 4.0; Sodium 141  No results found for requested labs within last 365 days.   CrCl cannot be calculated (Patient's most recent lab result is older than the maximum 21 days allowed.).   Wt Readings from Last 3 Encounters:  03/18/23 124 lb 9.6 oz (56.5 kg)  11/13/22 125 lb (56.7 kg)  10/07/22 124 lb 12.8 oz (56.6 kg)     Other studies reviewed: Additional studies/records reviewed today include: summarized above  ASSESSMENT AND PLAN:  PPM Intact function No programming changes made  HTN Looks ok  VHD Mild-mod AS Significant murmur on exam, no symptoms Plan to update her Echo next year   Discussed need for new EP MD, Dr. Nelly Laurence has been reading remotes, she is agreeable to transfer to his care  Disposition: F/u with remotes, in clinic in a year with Dr. Nelly Laurence, sooner if needed  Current medicines are reviewed at length with the patient today.  The patient did not have any concerns regarding medicines.  Norma Fredrickson, PA-C 03/18/2023 1:06 PM     [facility] [address] [place] [address] [phone] (office)  [phone] (fax)

## 2023-03-18 ENCOUNTER — Encounter: Payer: Self-pay | Admitting: Physician Assistant

## 2023-03-18 ENCOUNTER — Ambulatory Visit: Payer: 59 | Attending: Student | Admitting: Physician Assistant

## 2023-03-18 VITALS — BP 132/70 | HR 60 | Ht 62.0 in | Wt 124.6 lb

## 2023-03-18 DIAGNOSIS — I10 Essential (primary) hypertension: Secondary | ICD-10-CM

## 2023-03-18 DIAGNOSIS — I350 Nonrheumatic aortic (valve) stenosis: Secondary | ICD-10-CM | POA: Diagnosis not present

## 2023-03-18 DIAGNOSIS — I495 Sick sinus syndrome: Secondary | ICD-10-CM | POA: Diagnosis not present

## 2023-03-18 DIAGNOSIS — Z950 Presence of cardiac pacemaker: Secondary | ICD-10-CM

## 2023-03-18 DIAGNOSIS — R001 Bradycardia, unspecified: Secondary | ICD-10-CM

## 2023-03-18 LAB — CUP PACEART INCLINIC DEVICE CHECK
Battery Remaining Longevity: 60 mo
Battery Voltage: 2.99 V
Brady Statistic RA Percent Paced: 96 %
Brady Statistic RV Percent Paced: 1.4 %
Date Time Interrogation Session: 20240409131806
Implantable Lead Connection Status: 753985
Implantable Lead Connection Status: 753985
Implantable Lead Implant Date: 20190228
Implantable Lead Implant Date: 20190228
Implantable Lead Location: 753859
Implantable Lead Location: 753860
Implantable Pulse Generator Implant Date: 20190228
Lead Channel Impedance Value: 337.5 Ohm
Lead Channel Impedance Value: 487.5 Ohm
Lead Channel Pacing Threshold Amplitude: 0.5 V
Lead Channel Pacing Threshold Amplitude: 0.625 V
Lead Channel Pacing Threshold Pulse Width: 0.5 ms
Lead Channel Pacing Threshold Pulse Width: 0.5 ms
Lead Channel Sensing Intrinsic Amplitude: 2.1 mV
Lead Channel Sensing Intrinsic Amplitude: 9.7 mV
Lead Channel Setting Pacing Amplitude: 0.875
Lead Channel Setting Pacing Amplitude: 1.5 V
Lead Channel Setting Pacing Pulse Width: 0.5 ms
Lead Channel Setting Sensing Sensitivity: 2 mV
Pulse Gen Model: 2272
Pulse Gen Serial Number: 8996997

## 2023-03-18 NOTE — Patient Instructions (Signed)
Medication Instructions:  Your physician recommends that you continue on your current medications as directed. Please refer to the Current Medication list given to you today.  *If you need a refill on your cardiac medications before your next appointment, please call your pharmacy*  Lab Work: BMET and CBC--TODAY If you have labs (blood work) drawn today and your tests are completely normal, you will receive your results only by: MyChart Message (if you have MyChart) OR A paper copy in the mail If you have any lab test that is abnormal or we need to change your treatment, we will call you to review the results.  Follow-Up: At [facility], you and your health needs are our priority.  As part of our continuing mission to provide you with exceptional heart care, we have created designated Provider Care Teams.  These Care Teams include your primary Cardiologist (physician) and Advanced Practice Providers (APPs -  Physician Assistants and Nurse Practitioners) who all work together to provide you with the care you need, when you need it.  We recommend signing up for the patient portal called "MyChart".  Sign up information is provided on this After Visit Summary.  MyChart is used to connect with patients for Virtual Visits (Telemedicine).  Patients are able to view lab/test results, encounter notes, upcoming appointments, etc.  Non-urgent messages can be sent to your provider as well.   To learn more about what you can do with MyChart, go to [email or web address].    Your next appointment:   1 year(s)  Provider:   York Pellant, MD

## 2023-03-19 ENCOUNTER — Telehealth: Payer: Self-pay | Admitting: *Deleted

## 2023-03-19 ENCOUNTER — Telehealth: Payer: Self-pay | Admitting: Hematology and Oncology

## 2023-03-19 ENCOUNTER — Other Ambulatory Visit: Payer: Self-pay | Admitting: *Deleted

## 2023-03-19 DIAGNOSIS — D500 Iron deficiency anemia secondary to blood loss (chronic): Secondary | ICD-10-CM

## 2023-03-19 LAB — BASIC METABOLIC PANEL WITH GFR
BUN/Creatinine Ratio: 26 (ref 12–28)
BUN: 28 mg/dL — ABNORMAL HIGH (ref 8–27)
CO2: 23 mmol/L (ref 20–29)
Calcium: 9.1 mg/dL (ref 8.7–10.3)
Chloride: 102 mmol/L (ref 96–106)
Creatinine, Ser: 1.08 mg/dL — ABNORMAL HIGH (ref 0.57–1.00)
Glucose: 112 mg/dL — ABNORMAL HIGH (ref 70–99)
Potassium: 4.1 mmol/L (ref 3.5–5.2)
Sodium: 140 mmol/L (ref 134–144)
eGFR: 53 mL/min/1.73 — ABNORMAL LOW

## 2023-03-19 LAB — CBC
Hematocrit: 26.7 % — ABNORMAL LOW (ref 34.0–46.6)
Hemoglobin: 7.3 g/dL — ABNORMAL LOW (ref 11.1–15.9)
MCH: 21.2 pg — ABNORMAL LOW (ref 26.6–33.0)
MCHC: 27.3 g/dL — ABNORMAL LOW (ref 31.5–35.7)
MCV: 77 fL — ABNORMAL LOW (ref 79–97)
Platelets: 263 10*3/uL (ref 150–450)
RBC: 3.45 x10E6/uL — ABNORMAL LOW (ref 3.77–5.28)
RDW: 17.9 % — ABNORMAL HIGH (ref 11.7–15.4)
WBC: 3.8 10*3/uL (ref 3.4–10.8)

## 2023-03-19 NOTE — Telephone Encounter (Signed)
TCT patient and spoke with her. Advised that Dr. Leonides Schanz would like her to come in tomorrow for additional labs as her HGB was noted to be low at her cardiologist's office yesterday. Also advised that she needs to see her GI provider as soon as possible. Advised that I have called that office  to advise them of her situation. Pt voiced understanding.

## 2023-03-19 NOTE — Telephone Encounter (Signed)
Received vm message from pt stating that she had labs done at her cardiologist office yesterday and her HGB is 7.3. Her last HGB in December 2023 was 12.0   Please advise

## 2023-03-19 NOTE — Telephone Encounter (Signed)
Reached out to patient to schedule per 4/10 IB, patient aware of time and date of appointment.

## 2023-03-20 ENCOUNTER — Inpatient Hospital Stay: Payer: 59 | Attending: Hematology and Oncology

## 2023-03-20 ENCOUNTER — Other Ambulatory Visit: Payer: Self-pay | Admitting: *Deleted

## 2023-03-20 ENCOUNTER — Other Ambulatory Visit: Payer: Self-pay

## 2023-03-20 ENCOUNTER — Inpatient Hospital Stay: Payer: 59

## 2023-03-20 DIAGNOSIS — D500 Iron deficiency anemia secondary to blood loss (chronic): Secondary | ICD-10-CM

## 2023-03-20 DIAGNOSIS — Z8041 Family history of malignant neoplasm of ovary: Secondary | ICD-10-CM | POA: Insufficient documentation

## 2023-03-20 DIAGNOSIS — Z800 Family history of malignant neoplasm of digestive organs: Secondary | ICD-10-CM | POA: Insufficient documentation

## 2023-03-20 DIAGNOSIS — Z90710 Acquired absence of both cervix and uterus: Secondary | ICD-10-CM | POA: Diagnosis not present

## 2023-03-20 DIAGNOSIS — F17210 Nicotine dependence, cigarettes, uncomplicated: Secondary | ICD-10-CM | POA: Diagnosis not present

## 2023-03-20 LAB — CBC WITH DIFFERENTIAL (CANCER CENTER ONLY)
Abs Immature Granulocytes: 0 10*3/uL (ref 0.00–0.07)
Basophils Absolute: 0 10*3/uL (ref 0.0–0.1)
Basophils Relative: 1 %
Eosinophils Absolute: 0.1 10*3/uL (ref 0.0–0.5)
Eosinophils Relative: 3 %
HCT: 24.3 % — ABNORMAL LOW (ref 36.0–46.0)
Hemoglobin: 6.8 g/dL — CL (ref 12.0–15.0)
Immature Granulocytes: 0 %
Lymphocytes Relative: 34 %
Lymphs Abs: 1.1 10*3/uL (ref 0.7–4.0)
MCH: 21.3 pg — ABNORMAL LOW (ref 26.0–34.0)
MCHC: 28 g/dL — ABNORMAL LOW (ref 30.0–36.0)
MCV: 75.9 fL — ABNORMAL LOW (ref 80.0–100.0)
Monocytes Absolute: 0.4 10*3/uL (ref 0.1–1.0)
Monocytes Relative: 13 %
Neutro Abs: 1.5 10*3/uL — ABNORMAL LOW (ref 1.7–7.7)
Neutrophils Relative %: 49 %
Platelet Count: 229 10*3/uL (ref 150–400)
RBC: 3.2 MIL/uL — ABNORMAL LOW (ref 3.87–5.11)
RDW: 19.2 % — ABNORMAL HIGH (ref 11.5–15.5)
WBC Count: 3.1 10*3/uL — ABNORMAL LOW (ref 4.0–10.5)
nRBC: 0 % (ref 0.0–0.2)

## 2023-03-20 LAB — TYPE AND SCREEN
ABO/RH(D): O POS
Antibody Screen: NEGATIVE
Unit division: 0

## 2023-03-20 LAB — SAMPLE TO BLOOD BANK

## 2023-03-20 LAB — IRON AND IRON BINDING CAPACITY (CC-WL,HP ONLY)
Iron: 13 ug/dL — ABNORMAL LOW (ref 28–170)
Saturation Ratios: 2 % — ABNORMAL LOW (ref 10.4–31.8)
TIBC: 587 ug/dL — ABNORMAL HIGH (ref 250–450)
UIBC: 574 ug/dL — ABNORMAL HIGH (ref 148–442)

## 2023-03-20 LAB — BPAM RBC
Blood Product Expiration Date: 202405102359
ISSUE DATE / TIME: 202404111148
Unit Type and Rh: 5100

## 2023-03-20 LAB — FERRITIN: Ferritin: 3 ng/mL — ABNORMAL LOW (ref 11–307)

## 2023-03-20 LAB — PREPARE RBC (CROSSMATCH)

## 2023-03-20 MED ORDER — SODIUM CHLORIDE 0.9% IV SOLUTION
250.0000 mL | Freq: Once | INTRAVENOUS | Status: AC
  Administered 2023-03-20: 250 mL via INTRAVENOUS

## 2023-03-20 MED ORDER — ACETAMINOPHEN 325 MG PO TABS
650.0000 mg | ORAL_TABLET | Freq: Once | ORAL | Status: AC
  Administered 2023-03-20: 650 mg via ORAL
  Filled 2023-03-20: qty 2

## 2023-03-20 MED ORDER — ONDANSETRON HCL 8 MG PO TABS
8.0000 mg | ORAL_TABLET | Freq: Once | ORAL | Status: AC
  Administered 2023-03-20: 8 mg via ORAL
  Filled 2023-03-20: qty 1

## 2023-03-21 ENCOUNTER — Telehealth: Payer: Self-pay | Admitting: *Deleted

## 2023-03-21 LAB — TYPE AND SCREEN: Unit division: 0

## 2023-03-21 LAB — BPAM RBC
Blood Product Expiration Date: 202405102359
ISSUE DATE / TIME: 202404111148
Unit Type and Rh: 5100

## 2023-03-21 NOTE — Telephone Encounter (Signed)
Received vm message from pt this am. She is asking if she will be getting any iron infusions as she has seen her results from yesterday. She did receive 2 units of blood yesterday.  Please advise.

## 2023-03-21 NOTE — Telephone Encounter (Signed)
Fax'd lab results and request for pt to be seen ASAP to Dr. Dulce Sellar as we suspect she is having GI bleed with significant decrease in her HGB, ferritin and iron studies.

## 2023-03-24 ENCOUNTER — Other Ambulatory Visit: Payer: Self-pay | Admitting: Hematology and Oncology

## 2023-03-25 ENCOUNTER — Other Ambulatory Visit: Payer: Self-pay

## 2023-03-25 ENCOUNTER — Encounter: Payer: Self-pay | Admitting: Infectious Diseases

## 2023-03-25 ENCOUNTER — Ambulatory Visit (INDEPENDENT_AMBULATORY_CARE_PROVIDER_SITE_OTHER): Payer: 59 | Admitting: Infectious Diseases

## 2023-03-25 ENCOUNTER — Telehealth: Payer: Self-pay | Admitting: Pharmacy Technician

## 2023-03-25 VITALS — BP 126/46 | HR 60 | Temp 98.1°F | Ht 62.0 in | Wt 127.5 lb

## 2023-03-25 DIAGNOSIS — K31819 Angiodysplasia of stomach and duodenum without bleeding: Secondary | ICD-10-CM | POA: Diagnosis not present

## 2023-03-25 DIAGNOSIS — F17210 Nicotine dependence, cigarettes, uncomplicated: Secondary | ICD-10-CM | POA: Diagnosis not present

## 2023-03-25 DIAGNOSIS — Z8619 Personal history of other infectious and parasitic diseases: Secondary | ICD-10-CM | POA: Diagnosis not present

## 2023-03-25 DIAGNOSIS — M545 Low back pain, unspecified: Secondary | ICD-10-CM

## 2023-03-25 DIAGNOSIS — G8929 Other chronic pain: Secondary | ICD-10-CM

## 2023-03-25 DIAGNOSIS — Z113 Encounter for screening for infections with a predominantly sexual mode of transmission: Secondary | ICD-10-CM

## 2023-03-25 DIAGNOSIS — Z79899 Other long term (current) drug therapy: Secondary | ICD-10-CM | POA: Diagnosis not present

## 2023-03-25 DIAGNOSIS — F330 Major depressive disorder, recurrent, mild: Secondary | ICD-10-CM

## 2023-03-25 DIAGNOSIS — I10 Essential (primary) hypertension: Secondary | ICD-10-CM

## 2023-03-25 DIAGNOSIS — B20 Human immunodeficiency virus [HIV] disease: Secondary | ICD-10-CM

## 2023-03-25 DIAGNOSIS — F17200 Nicotine dependence, unspecified, uncomplicated: Secondary | ICD-10-CM

## 2023-03-25 MED ORDER — ESCITALOPRAM OXALATE 10 MG PO TABS: 10.0000 mg | ORAL_TABLET | Freq: Every day | ORAL | 3 refills | Status: DC | PRN

## 2023-03-25 NOTE — Assessment & Plan Note (Signed)
She will f/u with GI about her anemia, iron deficiency.

## 2023-03-25 NOTE — Assessment & Plan Note (Addendum)
Encouraged to quit.  Repeat lung cancer screening.

## 2023-03-25 NOTE — Assessment & Plan Note (Signed)
Had an outbreak in 2014. On Acyclovir  daily prn

## 2023-03-25 NOTE — Assessment & Plan Note (Signed)
Bp is better on repeat.

## 2023-03-25 NOTE — Telephone Encounter (Signed)
Dr. Leonides Schanz,  Monoferric is non preferred with patients insurance and will be denied if patient has not failed preferred medications. Feraheme is the preferred medication. Would you like to try feraheme?

## 2023-03-25 NOTE — Progress Notes (Signed)
Subjective:    Patient ID: Nicole Mccormick, female  DOB: [birth year], 77 y.o.        MRN: [id number]   HPI 77 yo F with hx of HTN and HIV+. She is also long term smoker.  She had episodes of bradycardia, and had pacer placed 02-05-18.  Was changed to biktarvy 06-2018 from tivicay-descovey. No problems with her ART.  Has still been having joint pains. Mostly in her lower back. 05-2021 1. Right convex scoliosis. 2. Spondylosis and facet hypertrophy at L4-5 and L5-S1. Still having lower back pain. Has been trying "tiger balm". Is limiting her ability get things done.  Does not want surgery.   Now living in single story dwelling.    She was in hospital June 2021 with anemia- she had colonoscopy (4-5 polyps, hemmeroids) and EGD (avm).  She still has GI f/u as well as Heme f/u for iron infusions.    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 Now 77 yo.   Had transfusion of 2 u prbc last week due to anemia (was 7.3 then down to 6.8). She is also getting iron infusion next week.  She has resumed po iron (gummies) vit c, calcium.  Was feeling weak. More energy now, and her color came back.  She is driving without difficulty.   Had pacer check 4-9. Told it will be good another 5 years.   L arm weakness, grip for last month- 6 weeks. Numbness.   Still smoking.    HIV 1 RNA Quant  Date Value  08/17/2021 Not Detected Copies/mL  08/23/2020 <20 DETECTED copies/mL  07/06/2018 <20 NOT DETECTED copies/mL   HIV-1 RNA Viral Load (copies/mL)  Date Value  11/13/2022 <20   CD4 T Cell Abs (/uL)  Date Value  11/13/2022 422  08/23/2020 395 (L)  06/05/2020 389 (L)     Health Maintenance  Topic Date Due   Medicare Annual Wellness (AWV)  Never done   Zoster Vaccines- Shingrix (1 of 2) Never done   DEXA SCAN  Never done   Lung Cancer Screening  12/30/2019   COVID-19 Vaccine (4 - 2023-24 season) 08/09/2022   DTaP/Tdap/Td (2 - Td or Tdap) 02/27/2023   INFLUENZA VACCINE   07/10/2023   COLONOSCOPY (Pts 45-57yrs Insurance coverage will need to be confirmed)  06/06/2027   Pneumonia Vaccine 12+ Years old  Completed   Hepatitis C Screening  Completed   HPV VACCINES  Aged Out      Review of Systems  Constitutional:  Negative for chills, fever and weight loss.  Respiratory:  Negative for cough and shortness of breath.   Cardiovascular:  Negative for chest pain (rarely has sharp pain, related to movement).  Gastrointestinal:  Negative for blood in stool, constipation, diarrhea and melena.  Genitourinary:  Negative for dysuria.    Please see HPI. All other systems reviewed and negative.     Objective:  Physical Exam Vitals reviewed.  Constitutional:      General: She is not in acute distress.    Appearance: Normal appearance. She is not ill-appearing.  HENT:     Mouth/Throat:     Mouth: Mucous membranes are moist.     Pharynx: No oropharyngeal exudate.  Eyes:     Extraocular Movements: Extraocular movements intact.     Pupils: Pupils are equal, round, and reactive to light.  Cardiovascular:     Rate and Rhythm: Normal rate and regular rhythm.     Heart sounds: Murmur heard.  Pulmonary:  Effort: Pulmonary effort is normal.     Breath sounds: Normal breath sounds.  Abdominal:     General: Bowel sounds are normal. There is no distension.     Palpations: Abdomen is soft.     Tenderness: There is no abdominal tenderness.  Musculoskeletal:        General: Normal range of motion.     Cervical back: Normal range of motion and neck supple.     Right lower leg: No edema.     Left lower leg: No edema.  Neurological:     General: No focal deficit present.     Mental Status: She is alert.     Motor: No weakness.  Psychiatric:        Mood and Affect: Mood normal.            Assessment & Plan:

## 2023-03-25 NOTE — Assessment & Plan Note (Signed)
She is doing well Will not repeat her labs from Dec. Esp since she is anemic.  Rtc in 6 months.  No change in biktarvy.  Aged out of mammo. She does not want pap.

## 2023-03-26 ENCOUNTER — Other Ambulatory Visit (HOSPITAL_COMMUNITY): Payer: Self-pay | Admitting: Pharmacy Technician

## 2023-03-26 ENCOUNTER — Other Ambulatory Visit: Payer: Self-pay | Admitting: Pharmacy Technician

## 2023-03-26 ENCOUNTER — Ambulatory Visit (INDEPENDENT_AMBULATORY_CARE_PROVIDER_SITE_OTHER): Payer: 59

## 2023-03-26 VITALS — BP 126/46 | HR 60 | Temp 98.1°F | Ht 62.0 in | Wt 127.5 lb

## 2023-03-26 DIAGNOSIS — Z0000 Encounter for general adult medical examination without abnormal findings: Secondary | ICD-10-CM

## 2023-03-26 DIAGNOSIS — D500 Iron deficiency anemia secondary to blood loss (chronic): Secondary | ICD-10-CM

## 2023-03-26 NOTE — Progress Notes (Signed)
Remote pacemaker transmission.   

## 2023-03-26 NOTE — Progress Notes (Addendum)
Subjective:   Nicole Mccormick is a 77 y.o. female who presents for an Initial Medicare Annual Wellness Visit. I connected with  Nicole Mccormick on 03/26/23 by a  Face-To-Face encounter  and verified that I am speaking with the correct person using two identifiers.  Patient Location: Other:  Office/Clinic  Provider Location: Office/Clinic  I discussed the limitations of evaluation and management by telemedicine. The patient expressed understanding and agreed to proceed.  Review of Systems    Defer to PCP       Objective:    Today's Vitals   03/26/23 1014 03/26/23 1015  BP: (!) 67/42 (!) 126/46  Pulse: 60   Temp: 98.1 F (36.7 C)   TempSrc: Oral   SpO2: 98%   Weight: 127 lb 8 oz (57.8 kg)   Height: 5\' 2"  (1.575 m)   PainSc: 10-Worst pain ever    Body mass index is 23.32 kg/m.     03/26/2023   10:15 AM 03/25/2023    1:41 PM 11/13/2022    2:54 PM 10/30/2020    2:00 PM 10/29/2020   10:00 PM 08/24/2020    5:54 PM 02/05/2018    2:59 PM  Advanced Directives  Does Patient Have a Medical Advance Directive? No No No No No Yes No  Type of Advance Directive      Healthcare Power of Attorney   Does patient want to make changes to medical advance directive?      No - Patient declined   Copy of Healthcare Power of Attorney in Chart?      No - copy requested   Would patient like information on creating a medical advance directive? No - Patient declined No - Patient declined  Yes (MAU/Ambulatory/Procedural Areas - Information given) Yes (Inpatient - patient requests chaplain consult to create a medical advance directive)  No - Patient declined    Current Medications (verified) Outpatient Encounter Medications as of 03/26/2023  Medication Sig   acyclovir (ZOVIRAX) 400 MG tablet Take 1 tablet (400 mg total) by mouth daily as needed (for flare ups).   albuterol (PROAIR HFA) 108 (90 Base) MCG/ACT inhaler INHALE 2 PUFFS BY MOUTH EVERY 4 HOURS IF NEEDED FOR WHEEZING OR SHORTNESS OF BREATH (Patient  taking differently: Inhale 2 puffs into the lungs every 4 (four) hours as needed for wheezing or shortness of breath.)   amLODipine (NORVASC) 10 MG tablet TAKE 1 TABLET BY MOUTH EVERY DAY (Patient taking differently: Take 10 mg by mouth daily.)   Ascorbic Acid (VITAMIN C) 1000 MG tablet Take 1,000 mg by mouth daily.   atorvastatin (LIPITOR) 40 MG tablet take 1 tablet by mouth once daily AT 6 PM (Patient taking differently: Take 40 mg by mouth every evening.)   BIKTARVY 50-200-25 MG TABS tablet TAKE 1 TABLET BY MOUTH EVERY DAY (Patient taking differently: Take 1 tablet by mouth daily.)   cholecalciferol (VITAMIN D3) 25 MCG (1000 UNIT) tablet Take 1,000 Units by mouth daily.   cyanocobalamin (VITAMIN B12) 1000 MCG tablet TAKE 1 TABLET BY MOUTH EVERY DAY   dextromethorphan (DELSYM) 30 MG/5ML liquid Take 30 mg by mouth as needed for cough.    diclofenac (VOLTAREN) 75 MG EC tablet Take 1 tablet (75 mg total) by mouth 2 (two) times daily as needed. Do not take until finished with steroid taper   Ensure (ENSURE) Take 237 mLs by mouth 2 (two) times daily between meals.   escitalopram (LEXAPRO) 10 MG tablet Take 1 tablet (10 mg total) by mouth  daily as needed (anxiety).   famotidine (PEPCID) 20 MG tablet Take 20 mg by mouth 2 (two) times daily.   ferrous sulfate 325 (65 FE) MG EC tablet Take 1 tablet (325 mg total) by mouth in the morning and at bedtime.   Flaxseed, Linseed, (FLAX SEED OIL) 1000 MG CAPS Take 1,000 mg by mouth daily.   folic acid (FOLVITE) 1 MG tablet TAKE 1 TABLET BY MOUTH EVERY DAY   gabapentin (NEURONTIN) 300 MG capsule Take 300 mg by mouth 2 (two) times daily.   hydrochlorothiazide (HYDRODIURIL) 25 MG tablet Take 25 mg by mouth daily.    HYDROcodone-acetaminophen (NORCO/VICODIN) 5-325 MG tablet Take 1 tablet by mouth 2 (two) times daily as needed.   isosorbide mononitrate (IMDUR) 60 MG 24 hr tablet Take 1 tablet (60 mg total) by mouth daily.   lisinopril (ZESTRIL) 40 MG tablet Take 40  mg by mouth daily.   loratadine (CLARITIN) 10 MG tablet Take 10 mg by mouth daily.   meclizine (ANTIVERT) 25 MG tablet Take 25 mg by mouth as needed for dizziness.    methocarbamol (ROBAXIN) 500 MG tablet Take 1 tablet (500 mg total) by mouth 2 (two) times daily as needed.   metoprolol succinate (TOPROL-XL) 50 MG 24 hr tablet TAKE 1 TABLET BY MOUTH AT BEDTIME   Omega-3 Fatty Acids (FISH OIL) 1000 MG CPDR Take 1,000 mg by mouth daily.   ondansetron (ZOFRAN-ODT) 4 MG disintegrating tablet 1 tablet on the tongue and allow to dissolve   pantoprazole (PROTONIX) 40 MG tablet Take 1 tablet (40 mg total) by mouth daily.   spironolactone (ALDACTONE) 25 MG tablet TAKE 1 TABLET BY MOUTH EVERY DAY (Patient taking differently: Take 25 mg by mouth daily.)   SYMBICORT 160-4.5 MCG/ACT inhaler Inhale 2 puffs into the lungs 2 (two) times daily.   vitamin A 82956 UNIT capsule Take 10,000 Units by mouth daily.   No facility-administered encounter medications on file as of 03/26/2023.    Allergies (verified) Penicillins, Penicillin g, and Avelox [moxifloxacin hcl in nacl]   History: Past Medical History:  Diagnosis Date   Abnormal Pap smear    Aortic stenosis    TTE 2021 with mean gradient   Arthritis    Asthma    AVM (arteriovenous malformation)    Bradycardia    Complication of anesthesia    "they have a hard time bringing me back" (11/08/2015)   Coronary artery disease    High grade RCA disease; diagnosed during acute GIB; on medical therapy   Dynamic left ventricular outflow obstruction    GERD (gastroesophageal reflux disease)    previously on aciphex, discontinued november 2012  because patient asymptomatic, and concern about interference with HIV meds.  May try pepcid in the future if symptoms return   Heart murmur    Hepatitis C antibody test positive    HIV infection    diagnosed before 2008   Hyperlipidemia 09/12/2021   Hypertension    Hypertensive heart disease    Iron  deficiency anemia    Ferritin = 2 in november 2012, started on iron supplemenation   Pacemaker    a. [employer] PPM 01/2018.   Seizures    Symptomatic anemia 08/24/2020   Varicosities    Past Surgical History:  Procedure Laterality Date   ABDOMINAL HYSTERECTOMY     CARDIAC CATHETERIZATION N/A 11/08/2015   Procedure: Left Heart Cath and Coronary Angiography;  Surgeon: Corky Crafts, MD;  Location: [facility] INVASIVE CV LAB;  Service:  Cardiovascular;  Laterality: N/A;   COLONOSCOPY  10/13/11   small adenoma, anal condyloma   COLONOSCOPY  10/13/2011   Procedure: COLONOSCOPY;  Surgeon: Iva Boop, MD;  Location: [facility] ENDOSCOPY;  Service: Endoscopy;  Laterality: N/A;   COLONOSCOPY N/A 09/14/2014   Procedure: COLONOSCOPY;  Surgeon: Willis Modena, MD;  Location: [facility] ENDOSCOPY;  Service: Endoscopy;  Laterality: N/A;   COLONOSCOPY WITH PROPOFOL N/A 06/05/2020   Procedure: COLONOSCOPY WITH PROPOFOL;  Surgeon: Willis Modena, MD;  Location: [facility] ENDOSCOPY;  Service: Endoscopy;  Laterality: N/A;   ENTEROSCOPY N/A 10/31/2020   Procedure: ENTEROSCOPY;  Surgeon: Kathi Der, MD;  Location: [facility] ENDOSCOPY;  Service: Gastroenterology;  Laterality: N/A;   ESOPHAGOGASTRODUODENOSCOPY  10/13/11   small hiatal hernia   ESOPHAGOGASTRODUODENOSCOPY  10/13/2011   Procedure: ESOPHAGOGASTRODUODENOSCOPY (EGD);  Surgeon: Iva Boop, MD;  Location: [facility] ENDOSCOPY;  Service: Endoscopy;  Laterality: N/A;   ESOPHAGOGASTRODUODENOSCOPY N/A 09/14/2014   Procedure: ESOPHAGOGASTRODUODENOSCOPY (EGD);  Surgeon: Willis Modena, MD;  Location: [facility] ENDOSCOPY;  Service: Endoscopy;  Laterality: N/A;   ESOPHAGOGASTRODUODENOSCOPY (EGD) WITH PROPOFOL N/A 06/05/2020   Procedure: ESOPHAGOGASTRODUODENOSCOPY (EGD) WITH PROPOFOL;  Surgeon: Willis Modena, MD;  Location: [facility] ENDOSCOPY;  Service: Endoscopy;  Laterality: N/A;   GIVENS CAPSULE STUDY N/A 09/14/2014   Procedure: GIVENS CAPSULE STUDY;  Surgeon: Willis Modena, MD;  Location: [facility]  ENDOSCOPY;  Service: Endoscopy;  Laterality: N/A;   GIVENS CAPSULE STUDY N/A 08/26/2020   Procedure: GIVENS CAPSULE STUDY;  Surgeon: Graylin Shiver, MD;  Location: [facility] ENDOSCOPY;  Service: Endoscopy;  Laterality: N/A;   HOT HEMOSTASIS N/A 10/31/2020   Procedure: HOT HEMOSTASIS (ARGON PLASMA COAGULATION/BICAP);  Surgeon: Kathi Der, MD;  Location: [facility] ENDOSCOPY;  Service: Gastroenterology;  Laterality: N/A;   PACEMAKER IMPLANT N/A 02/05/2018   Procedure: PACEMAKER IMPLANT;  Surgeon: Hillis Range, MD;  Location: [facility] INVASIVE CV LAB;  Service: Cardiovascular;  Laterality: N/A;   POLYPECTOMY  06/05/2020   Procedure: POLYPECTOMY;  Surgeon: Willis Modena, MD;  Location: [facility] ENDOSCOPY;  Service: Endoscopy;;   Family History  Problem Relation Age of Onset   Diabetes Mother    Ovarian cancer Sister    Colon cancer Sister    Social History   Socioeconomic History   Marital status: Married    Spouse name: Not on file   Number of children: 6   Years of education: Not on file   Highest education level: Not on file  Occupational History   Occupation: retired  Tobacco Use   Smoking status: Every Day    Packs/day: 1.00    Years: 60.00    Additional pack years: 0.00    Total pack years: 60.00    Types: Cigarettes   Smokeless tobacco: Never   Tobacco comments:    Sometimes less.  Vaping Use   Vaping Use: Never used  Substance and Sexual Activity   Alcohol use: Yes    Alcohol/week: 10.0 standard drinks of alcohol    Types: 10 Glasses of wine per week    Comment: wine sometimes.   Drug use: No   Sexual activity: Yes    Partners: Male    Birth control/protection: Surgical    Comment: pt. given condoms 08/23/20  Other Topics Concern   Not on file  Social History Narrative   Not on file   Social Determinants of Health   Financial Resource Strain: High Risk (03/26/2023)   Overall Financial Resource Strain (CARDIA)    Difficulty of Paying Living Expenses: Hard  Food Insecurity: No  Food Insecurity (  03/26/2023)   Hunger Vital Sign    Worried About Running Out of Food in the Last Year: Never true    Ran Out of Food in the Last Year: Never true  Transportation Needs: Unmet Transportation Needs (03/26/2023)   PRAPARE - Transportation    Lack of Transportation (Medical): Yes    Lack of Transportation (Non-Medical): Yes  Physical Activity: Inactive (03/26/2023)   Exercise Vital Sign    Days of Exercise per Week: 1 day    Minutes of Exercise per Session: 0 min  Stress: No Stress Concern Present (03/26/2023)   [employer] of Occupational Health - Occupational Stress Questionnaire    Feeling of Stress : Only a little  Social Connections: Socially Integrated (03/26/2023)   Social Connection and Isolation Panel [NHANES]    Frequency of Communication with Friends and Family: More than three times a week    Frequency of Social Gatherings with Friends and Family: More than three times a week    Attends Religious Services: More than 4 times per year    Active Member of [employer] or Organizations: Yes    Attends Banker Meetings: 1 to 4 times per year    Marital Status: Married    Tobacco Counseling Ready to quit: No Counseling given: No Tobacco comments: Sometimes less.   Clinical Intake:  Pre-visit preparation completed: Yes  Pain : 0-10 Pain Score: 10-Worst pain ever Pain Type: Chronic pain Pain Location: Back Pain Orientation: Lower Pain Descriptors / Indicators: Sharp, Constant Pain Onset: 1 to 4 weeks ago Pain Frequency: Constant Pain Relieving Factors: otc meds.  Pain Relieving Factors: otc meds.  Nutritional Risks: None Diabetes: No  How often do you need to have someone help you when you read instructions, pamphlets, or other written materials from your doctor or pharmacy?: 1 - Never  Diabetic?No  Interpreter Needed?: No  Information entered by :: Katriana Dortch,cma   Activities of Daily Living    03/26/2023   10:15 AM 03/25/2023     1:41 PM  In your present state of health, do you have any difficulty performing the following activities:  Hearing? 0 0  Vision? 0 0  Difficulty concentrating or making decisions? 0 0  Walking or climbing stairs? 1 1  Dressing or bathing? 0 0  Doing errands, shopping? 1 1    Patient Care Team: Raymon Mutton., FNP as PCP - General (Family Medicine) Ninetta Lights Lacretia Leigh, MD as PCP - Infectious Diseases (Infectious Diseases) Mealor, Roberts Gaudy, MD as PCP - Electrophysiology (Cardiology) Willis Modena, MD as Consulting Physician (Gastroenterology)  Indicate any recent Medical Services you may have received from other than [facility] providers in the past year (date may be approximate).     Assessment:   This is a routine wellness examination for Nicole Mccormick.  Hearing/Vision screen No results found.  Dietary issues and exercise activities discussed:     Goals Addressed   None   Depression Screen    03/26/2023   10:15 AM 03/25/2023    1:40 PM 11/13/2022    2:54 PM 02/15/2022    9:12 AM 08/23/2020    9:25 AM 07/06/2018   11:08 AM 05/20/2017    3:33 PM  PHQ 2/9 Scores  PHQ - 2 Score 0 0 0 0 0 1 1    Fall Risk    03/26/2023   10:15 AM 03/25/2023    1:40 PM 11/13/2022    2:51 PM 02/15/2022    9:12 AM 08/23/2020  9:24 AM  Fall Risk   Falls in the past year? 1 1 1  0 1  Number falls in past yr: 1 1 1  0 1  Injury with Fall? 0 1 0 0 0  Risk for fall due to : Impaired balance/gait;Impaired mobility Impaired mobility;Impaired balance/gait History of fall(s)  History of fall(s)  Follow up Falls evaluation completed;Falls prevention discussed Falls evaluation completed;Falls prevention discussed Falls evaluation completed  Falls evaluation completed    FALL RISK PREVENTION PERTAINING TO THE HOME:  Any stairs in or around the home? Yes  If so, are there any without handrails? Yes  Home free of loose throw rugs in walkways, pet beds, electrical cords, etc? No  Adequate lighting in your home  to reduce risk of falls? No   ASSISTIVE DEVICES UTILIZED TO PREVENT FALLS:  Life alert? No  Use of a cane, walker or w/c? Yes  Grab bars in the bathroom? No  Shower chair or bench in shower? No  Elevated toilet seat or a handicapped toilet? No   TIMED UP AND GO:  Was the test performed? No .  Length of time to ambulate 10 feet: 0 sec.   Gait unsteady without use of assistive device, provider informed and interventions were implemented  Cognitive Function:        03/26/2023   10:16 AM  6CIT Screen  What Year? 0 points  What month? 0 points  What time? 0 points  Count back from 20 0 points  Months in reverse 0 points  Repeat phrase 0 points  Total Score 0 points    Immunizations Immunization History  Administered Date(s) Administered   Hepatitis B 07/09/1998, 08/09/1998, 01/31/1999   Influenza Split 10/28/2012   Influenza Whole 12/14/2003, 12/14/2007, 10/10/2008, 01/25/2010, 08/27/2010, 08/09/2011   Influenza,inj,Quad PF,6+ Mos 08/17/2013, 09/12/2014, 08/07/2015, 12/16/2017   Influenza-Unspecified 09/06/2016, 08/10/2020, 08/10/2021   PFIZER(Purple Top)SARS-COV-2 Vaccination 01/10/2020, 01/31/2020, 08/10/2020   Pneumococcal Conjugate-13 08/07/2015   Pneumococcal Polysaccharide-23 06/17/2007, 10/28/2012   Tdap 02/26/2013   Varicella 07/10/2015   Zoster, Live 09/06/2016    TDAP status: Due, Education has been provided regarding the importance of this vaccine. Advised may receive this vaccine at local pharmacy or Health Dept. Aware to provide a copy of the vaccination record if obtained from local pharmacy or Health Dept. Verbalized acceptance and understanding.  Flu Vaccine status: Up to date  Pneumococcal vaccine status: Up to date  Covid-19 vaccine status: Completed vaccines  Qualifies for Shingles Vaccine? No   Zostavax completed No   Shingrix Completed?: No.    Education has been provided regarding the importance of this vaccine. Patient has been advised to  call insurance company to determine out of pocket expense if they have not yet received this vaccine. Advised may also receive vaccine at local pharmacy or Health Dept. Verbalized acceptance and understanding.  Screening Tests Health Maintenance  Topic Date Due   Zoster Vaccines- Shingrix (1 of 2) Never done   DEXA SCAN  Never done   Lung Cancer Screening  12/30/2019   COVID-19 Vaccine (4 - 2023-24 season) 08/09/2022   DTaP/Tdap/Td (2 - Td or Tdap) 02/27/2023   INFLUENZA VACCINE  07/10/2023   Medicare Annual Wellness (AWV)  03/25/2024   COLONOSCOPY (Pts 45-70yrs Insurance coverage will need to be confirmed)  06/06/2027   Pneumonia Vaccine 19+ Years old  Completed   Hepatitis C Screening  Completed   HPV VACCINES  Aged Out    Health Maintenance  Health Maintenance Due  Topic Date  Due   Zoster Vaccines- Shingrix (1 of 2) Never done   DEXA SCAN  Never done   Lung Cancer Screening  12/30/2019   COVID-19 Vaccine (4 - 2023-24 season) 08/09/2022   DTaP/Tdap/Td (2 - Td or Tdap) 02/27/2023    Colorectal cancer screening: Type of screening: Colonoscopy. Completed 06/05/2020. Repeat every 7 years  Lung Cancer Screening: (Low Dose CT Chest recommended if Age 13-80 years, 30 pack-year currently smoking OR have quit w/in 15years.) does not qualify.   Lung Cancer Screening Referral: N/A  Additional Screening:  Hepatitis C Screening: does not qualify; Completed 07/06/2018  Vision Screening: Recommended annual ophthalmology exams for early detection of glaucoma and other disorders of the eye. Is the patient up to date with their annual eye exam?  No  Who is the provider or what is the name of the office in which the patient attends annual eye exams? N/A If pt is not established with a provider, would they like to be referred to a provider to establish care? No .   Dental Screening: Recommended annual dental exams for proper oral hygiene  Community Resource Referral / Chronic Care  Management: CRR required this visit?  No   CCM required this visit?  No      Plan:     I have personally reviewed and noted the following in the patient's chart:   Medical and social history Use of alcohol, tobacco or illicit drugs  Current medications and supplements including opioid prescriptions. Patient is currently taking opioid prescriptions. Information provided to patient regarding non-opioid alternatives. Patient advised to discuss non-opioid treatment plan with their provider. Functional ability and status Nutritional status Physical activity Advanced directives List of other physicians Hospitalizations, surgeries, and ER visits in previous 12 months Vitals Screenings to include cognitive, depression, and falls Referrals and appointments  In addition, I have reviewed and discussed with patient certain preventive protocols, quality metrics, and best practice recommendations. A written personalized care plan for preventive services as well as general preventive health recommendations were provided to patient.     Cala Bradford, CMA   03/26/2023   Nurse Notes: Face-To-Face Visit  Ms. Nielsen , Thank you for taking time to come for your Medicare Wellness Visit. I appreciate your ongoing commitment to your health goals. Please review the following plan we discussed and let me know if I can assist you in the future.   These are the goals we discussed:  Goals      Prevent Falls        This is a list of the screening recommended for you and due dates:  Health Maintenance  Topic Date Due   Zoster (Shingles) Vaccine (1 of 2) Never done   DEXA scan (bone density measurement)  Never done   Screening for Lung Cancer  12/30/2019   COVID-19 Vaccine (4 - 2023-24 season) 08/09/2022   DTaP/Tdap/Td vaccine (2 - Td or Tdap) 02/27/2023   Flu Shot  07/10/2023   Medicare Annual Wellness Visit  03/25/2024   Colon Cancer Screening  06/06/2027   Pneumonia Vaccine  Completed    Hepatitis C Screening: USPSTF Recommendation to screen - Ages 18-79 yo.  Completed   HPV Vaccine  Aged Out

## 2023-03-26 NOTE — Addendum Note (Signed)
Addended by: Cameo Schmiesing C on: 03/26/2023 10:32 AM   Modules accepted: Level of Service

## 2023-04-01 ENCOUNTER — Other Ambulatory Visit: Payer: Self-pay | Admitting: Hematology and Oncology

## 2023-04-01 ENCOUNTER — Ambulatory Visit: Payer: 59

## 2023-04-01 DIAGNOSIS — D500 Iron deficiency anemia secondary to blood loss (chronic): Secondary | ICD-10-CM

## 2023-04-01 MED ORDER — DIPHENHYDRAMINE HCL 25 MG PO CAPS: 25.0000 mg | ORAL_CAPSULE | Freq: Once | ORAL | Status: AC

## 2023-04-01 MED ORDER — SODIUM CHLORIDE 0.9 % IV SOLN
510.0000 mg | Freq: Once | INTRAVENOUS | Status: AC
  Filled 2023-04-01: qty 17

## 2023-04-01 MED ORDER — ACETAMINOPHEN 325 MG PO TABS: 650.0000 mg | ORAL_TABLET | Freq: Once | ORAL | Status: AC

## 2023-04-01 NOTE — Progress Notes (Unsigned)
[facility] Telephone:([phone]) [phone]   Fax:([phone]) [phone]  PROGRESS NOTE  Patient Care Team: Raymon Mutton., FNP as PCP - General (Family Medicine) Ninetta Lights Lacretia Leigh, MD as PCP - Infectious Diseases (Infectious Diseases) Mealor, Roberts Gaudy, MD as PCP - Electrophysiology (Cardiology) Willis Modena, MD as Consulting Physician (Gastroenterology)  Hematological/Oncological History # Iron Deficiency Anemia 2/2 to GI Bleed  10/29/2020: patient presented the ED with weakness and was found to have a Hgb of 4.1 11/01/2020: GI evaluation showed numerous bleeding/nonbleeding AVMS. Cauterization performed.  12/18/2020: Iron 32, TIBC 441, Sat 7%, Ferritin 31. Hgb 8.3, Plt 313, WBC 4.2, MCV 102.5.  01/03/2021: establish care with Dr. Leonides Schanz  2/4-2/09/2021: IV feraheme 510mg  x 2 doses. Received 1 unit of PRBC on 01/12/2021  08/01/2021: WBC 4.5, Hgb 12.1, MCV 104.9, Plt 223 02/06/2022: WBC 5.7, Hgb 13.0, MCV 104.8, Plt 196 03/20/2023: Hgb 6.8, WBC 3.1, MCV 75.9, ferritin 3, Iron sat 2%. Patient received 2 units of PRBC.   Interval History:  Nicole Mccormick 76 y.o. female with medical history significant for iron deficiency anemia 2/2 to GI bleed who presents for a follow up visit. The patient's last visit was on 09/30/2022. In the interim since the last visit she received 2 units of PRBC and 1 unit of Feraheme for Hgb 6.8.   On exam today Nicole Mccormick notes when her hemoglobin dropped below 7 she was "feeling terrible and could not get up".  She reports that she could not walk from her chair to the door.  She reports that she does have some occasional belly pain but was not having any recently.  She notes that she also was not having any overt signs of bleeding such as dark stools, bright red blood in stool, blood in the urine, or nosebleeds/gum bleeds.  She reports that she did have some lightheadedness and dizziness on occasion.  She reports that other than her routine lower back pain she has not  been having any other issues.  She reports that she is currently taking iron pills and tolerating them well without difficulty.  Her weight has been stable and she otherwise denies any fevers, chills, sweats, nausea, vomiting or diarrhea.  A full 10 point ROS is listed below.  MEDICAL HISTORY:  Past Medical History:  Diagnosis Date   Abnormal Pap smear    Aortic stenosis    TTE 2021 with mean gradient   Arthritis    Asthma    AVM (arteriovenous malformation)    Bradycardia    Complication of anesthesia    "they have a hard time bringing me back" (11/08/2015)   Coronary artery disease    High grade RCA disease; diagnosed during acute GIB; on medical therapy   Dynamic left ventricular outflow obstruction    GERD (gastroesophageal reflux disease)    previously on aciphex, discontinued november 2012  because patient asymptomatic, and concern about interference with HIV meds.  May try pepcid in the future if symptoms return   Heart murmur    Hepatitis C antibody test positive    HIV infection    diagnosed before 2008   Hyperlipidemia 09/12/2021   Hypertension    Hypertensive heart disease    Iron deficiency anemia    Ferritin = 2 in november 2012, started on iron supplemenation   Pacemaker    a. [employer] PPM 01/2018.   Seizures    Symptomatic anemia 08/24/2020   Varicosities     SURGICAL HISTORY: Past Surgical History:  Procedure Laterality Date   ABDOMINAL HYSTERECTOMY     CARDIAC CATHETERIZATION N/A 11/08/2015   Procedure: Left Heart Cath and Coronary Angiography;  Surgeon: Corky Crafts, MD;  Location: [facility] INVASIVE CV LAB;  Service: Cardiovascular;  Laterality: N/A;   COLONOSCOPY  10/13/11   small adenoma, anal condyloma   COLONOSCOPY  10/13/2011   Procedure: COLONOSCOPY;  Surgeon: Iva Boop, MD;  Location: [facility] ENDOSCOPY;  Service: Endoscopy;  Laterality: N/A;   COLONOSCOPY N/A 09/14/2014   Procedure: COLONOSCOPY;  Surgeon: Willis Modena, MD;  Location: [facility]  ENDOSCOPY;  Service: Endoscopy;  Laterality: N/A;   COLONOSCOPY WITH PROPOFOL N/A 06/05/2020   Procedure: COLONOSCOPY WITH PROPOFOL;  Surgeon: Willis Modena, MD;  Location: [facility] ENDOSCOPY;  Service: Endoscopy;  Laterality: N/A;   ENTEROSCOPY N/A 10/31/2020   Procedure: ENTEROSCOPY;  Surgeon: Kathi Der, MD;  Location: [facility] ENDOSCOPY;  Service: Gastroenterology;  Laterality: N/A;   ESOPHAGOGASTRODUODENOSCOPY  10/13/11   small hiatal hernia   ESOPHAGOGASTRODUODENOSCOPY  10/13/2011   Procedure: ESOPHAGOGASTRODUODENOSCOPY (EGD);  Surgeon: Iva Boop, MD;  Location: [facility] ENDOSCOPY;  Service: Endoscopy;  Laterality: N/A;   ESOPHAGOGASTRODUODENOSCOPY N/A 09/14/2014   Procedure: ESOPHAGOGASTRODUODENOSCOPY (EGD);  Surgeon: Willis Modena, MD;  Location: [facility] ENDOSCOPY;  Service: Endoscopy;  Laterality: N/A;   ESOPHAGOGASTRODUODENOSCOPY (EGD) WITH PROPOFOL N/A 06/05/2020   Procedure: ESOPHAGOGASTRODUODENOSCOPY (EGD) WITH PROPOFOL;  Surgeon: Willis Modena, MD;  Location: [facility] ENDOSCOPY;  Service: Endoscopy;  Laterality: N/A;   GIVENS CAPSULE STUDY N/A 09/14/2014   Procedure: GIVENS CAPSULE STUDY;  Surgeon: Willis Modena, MD;  Location: [facility] ENDOSCOPY;  Service: Endoscopy;  Laterality: N/A;   GIVENS CAPSULE STUDY N/A 08/26/2020   Procedure: GIVENS CAPSULE STUDY;  Surgeon: Graylin Shiver, MD;  Location: [facility] ENDOSCOPY;  Service: Endoscopy;  Laterality: N/A;   HOT HEMOSTASIS N/A 10/31/2020   Procedure: HOT HEMOSTASIS (ARGON PLASMA COAGULATION/BICAP);  Surgeon: Kathi Der, MD;  Location: [facility] ENDOSCOPY;  Service: Gastroenterology;  Laterality: N/A;   PACEMAKER IMPLANT N/A 02/05/2018   Procedure: PACEMAKER IMPLANT;  Surgeon: Hillis Range, MD;  Location: [facility] INVASIVE CV LAB;  Service: Cardiovascular;  Laterality: N/A;   POLYPECTOMY  06/05/2020   Procedure: POLYPECTOMY;  Surgeon: Willis Modena, MD;  Location: [facility] ENDOSCOPY;  Service: Endoscopy;;    SOCIAL HISTORY: Social History   Socioeconomic History    Marital status: Married    Spouse name: Not on file   Number of children: 6   Years of education: Not on file   Highest education level: Not on file  Occupational History   Occupation: retired  Tobacco Use   Smoking status: Every Day    Packs/day: 1.00    Years: 60.00    Additional pack years: 0.00    Total pack years: 60.00    Types: Cigarettes   Smokeless tobacco: Never   Tobacco comments:    Sometimes less.  Vaping Use   Vaping Use: Never used  Substance and Sexual Activity   Alcohol use: Yes    Alcohol/week: 10.0 standard drinks of alcohol    Types: 10 Glasses of wine per week    Comment: wine sometimes.   Drug use: No   Sexual activity: Yes    Partners: Male    Birth control/protection: Surgical    Comment: pt. given condoms 08/23/20  Other Topics Concern   Not on file  Social History Narrative   Not on file   Social Determinants of Health   Financial Resource Strain: High Risk (03/26/2023)   Overall Financial Resource Strain (CARDIA)  Difficulty of Paying Living Expenses: Hard  Food Insecurity: No Food Insecurity (03/26/2023)   Hunger Vital Sign    Worried About Running Out of Food in the Last Year: Never true    Ran Out of Food in the Last Year: Never true  Transportation Needs: Unmet Transportation Needs (03/26/2023)   PRAPARE - Transportation    Lack of Transportation (Medical): Yes    Lack of Transportation (Non-Medical): Yes  Physical Activity: Inactive (03/26/2023)   Exercise Vital Sign    Days of Exercise per Week: 1 day    Minutes of Exercise per Session: 0 min  Stress: No Stress Concern Present (03/26/2023)   [employer] of Occupational Health - Occupational Stress Questionnaire    Feeling of Stress : Only a little  Social Connections: Socially Integrated (03/26/2023)   Social Connection and Isolation Panel [NHANES]    Frequency of Communication with Friends and Family: More than three times a week    Frequency of Social Gatherings with Friends  and Family: More than three times a week    Attends Religious Services: More than 4 times per year    Active Member of [employer] or Organizations: Yes    Attends Banker Meetings: 1 to 4 times per year    Marital Status: Married  Catering manager Violence: Not At Risk (03/26/2023)   Humiliation, Afraid, Rape, and Kick questionnaire    Fear of Current or Ex-Partner: No    Emotionally Abused: No    Physically Abused: No    Sexually Abused: No    FAMILY HISTORY: Family History  Problem Relation Age of Onset   Diabetes Mother    Ovarian cancer Sister    Colon cancer Sister     ALLERGIES:  is allergic to penicillins, penicillin g, and avelox [moxifloxacin hcl in nacl].  MEDICATIONS:  Current Outpatient Medications  Medication Sig Dispense Refill   acyclovir (ZOVIRAX) 400 MG tablet Take 1 tablet (400 mg total) by mouth daily as needed (for flare ups). 30 tablet 3   albuterol (PROAIR HFA) 108 (90 Base) MCG/ACT inhaler INHALE 2 PUFFS BY MOUTH EVERY 4 HOURS IF NEEDED FOR WHEEZING OR SHORTNESS OF BREATH (Patient taking differently: Inhale 2 puffs into the lungs every 4 (four) hours as needed for wheezing or shortness of breath.) 8.5 g 0   amLODipine (NORVASC) 10 MG tablet TAKE 1 TABLET BY MOUTH EVERY DAY (Patient not taking: Reported on 04/02/2023) 90 tablet 0   Ascorbic Acid (VITAMIN C) 1000 MG tablet Take 1,000 mg by mouth daily.     atorvastatin (LIPITOR) 40 MG tablet take 1 tablet by mouth once daily AT 6 PM (Patient taking differently: Take 40 mg by mouth every evening.) 90 tablet 0   BIKTARVY 50-200-25 MG TABS tablet TAKE 1 TABLET BY MOUTH EVERY DAY (Patient taking differently: Take 1 tablet by mouth daily.) 90 tablet 3   cholecalciferol (VITAMIN D3) 25 MCG (1000 UNIT) tablet Take 1,000 Units by mouth daily.     cyanocobalamin (VITAMIN B12) 1000 MCG tablet TAKE 1 TABLET BY MOUTH EVERY DAY 90 tablet 1   dextromethorphan (DELSYM) 30 MG/5ML liquid Take 30 mg by mouth as needed for  cough.      diclofenac (VOLTAREN) 75 MG EC tablet Take 1 tablet (75 mg total) by mouth 2 (two) times daily as needed. Do not take until finished with steroid taper 60 tablet 2   Ensure (ENSURE) Take 237 mLs by mouth 2 (two) times daily between meals. 5688 mL 5  escitalopram (LEXAPRO) 10 MG tablet Take 1 tablet (10 mg total) by mouth daily as needed (anxiety). 30 tablet 3   famotidine (PEPCID) 20 MG tablet Take 20 mg by mouth 2 (two) times daily.     ferrous sulfate 325 (65 FE) MG EC tablet Take 1 tablet (325 mg total) by mouth in the morning and at bedtime. 180 tablet 0   Flaxseed, Linseed, (FLAX SEED OIL) 1000 MG CAPS Take 1,000 mg by mouth daily.     folic acid (FOLVITE) 1 MG tablet TAKE 1 TABLET BY MOUTH EVERY DAY 90 tablet 1   gabapentin (NEURONTIN) 300 MG capsule Take 300 mg by mouth 2 (two) times daily.     hydrochlorothiazide (HYDRODIURIL) 25 MG tablet Take 25 mg by mouth daily.      HYDROcodone-acetaminophen (NORCO/VICODIN) 5-325 MG tablet Take 1 tablet by mouth 2 (two) times daily as needed.     isosorbide mononitrate (IMDUR) 60 MG 24 hr tablet Take 1 tablet (60 mg total) by mouth daily. 90 tablet 1   lisinopril (ZESTRIL) 40 MG tablet Take 40 mg by mouth daily. (Patient not taking: Reported on 04/02/2023)     loratadine (CLARITIN) 10 MG tablet Take 10 mg by mouth daily.     meclizine (ANTIVERT) 25 MG tablet Take 25 mg by mouth as needed for dizziness.      methocarbamol (ROBAXIN) 500 MG tablet Take 1 tablet (500 mg total) by mouth 2 (two) times daily as needed. 20 tablet 1   metoprolol succinate (TOPROL-XL) 50 MG 24 hr tablet TAKE 1 TABLET BY MOUTH AT BEDTIME 90 tablet 0   Omega-3 Fatty Acids (FISH OIL) 1000 MG CPDR Take 1,000 mg by mouth daily.     ondansetron (ZOFRAN-ODT) 4 MG disintegrating tablet 1 tablet on the tongue and allow to dissolve     pantoprazole (PROTONIX) 40 MG tablet Take 1 tablet (40 mg total) by mouth daily. 90 tablet 0   spironolactone (ALDACTONE) 25 MG tablet TAKE 1  TABLET BY MOUTH EVERY DAY (Patient taking differently: Take 25 mg by mouth daily.) 30 tablet 0   SYMBICORT 160-4.5 MCG/ACT inhaler Inhale 2 puffs into the lungs 2 (two) times daily.     vitamin A 09811 UNIT capsule Take 10,000 Units by mouth daily.     No current facility-administered medications for this visit.   Facility-Administered Medications Ordered in Other Visits  Medication Dose Route Frequency Provider Last Rate Last Admin   acetaminophen (TYLENOL) tablet 650 mg  650 mg Oral Once Desma Mcgregor, RPH       diphenhydrAMINE (BENADRYL) capsule 25 mg  25 mg Oral Once Desma Mcgregor, [facility]       ferumoxytol [facility]) 510 mg in sodium chloride 0.9 % 100 mL IVPB  510 mg Intravenous Once Desma Mcgregor, [facility]        REVIEW OF SYSTEMS:   Constitutional: ( - ) fevers, ( - )  chills , ( - ) night sweats Eyes: ( - ) blurriness of vision, ( - ) double vision, ( - ) watery eyes Ears, nose, mouth, throat, and face: ( - ) mucositis, ( - ) sore throat Respiratory: ( - ) cough, ( - ) dyspnea, ( - ) wheezes Cardiovascular: ( - ) palpitation, ( - ) chest discomfort, ( - ) lower extremity swelling Gastrointestinal:  ( - ) nausea, ( - ) heartburn, ( - ) change in bowel habits Skin: ( - ) abnormal skin rashes Lymphatics: ( - ) new lymphadenopathy, ( - )  easy bruising Neurological: ( - ) numbness, ( - ) tingling, ( - ) new weaknesses Behavioral/Psych: ( - ) mood change, ( - ) new changes  All other systems were reviewed with the patient and are negative.  PHYSICAL EXAMINATION:  Vitals:   04/02/23 1140 04/02/23 1141  BP: (!) 227/96 (!) 208/84  Pulse: 60   Resp: 14   Temp: 97.7 F (36.5 C)   SpO2: 98%     Filed Weights   04/02/23 1140  Weight: 124 lb 9.6 oz (56.5 kg)     GENERAL: well appearing elderly African American female. alert, no distress and comfortable SKIN: skin color, texture, turgor are normal, no rashes or significant lesions EYES: conjunctiva are pink and non-injected, sclera  clear LUNGS: clear to auscultation and percussion with normal breathing effort HEART: regular rate & rhythm and no murmurs and no lower extremity edema Musculoskeletal: no cyanosis of digits and no clubbing  PSYCH: alert & oriented x 3, fluent speech NEURO: no focal motor/sensory deficits  LABORATORY DATA:  I have reviewed the data as listed    Latest Ref Rng & Units 04/02/2023   10:06 AM 03/20/2023    9:53 AM 03/18/2023   11:00 AM  CBC  WBC 4.0 - 10.5 K/uL 4.5  3.1  3.8   Hemoglobin 12.0 - 15.0 g/dL 16.1  6.8  7.3   Hematocrit 36.0 - 46.0 % 33.9  24.3  26.7   Platelets 150 - 400 K/uL 223  229  263        Latest Ref Rng & Units 04/02/2023   10:06 AM 03/18/2023   11:00 AM 11/13/2022    3:27 PM  CMP  Glucose 70 - 99 mg/dL 096  045  409   BUN 8 - 23 mg/dL 24  28  21    Creatinine 0.44 - 1.00 mg/dL 8.11  9.14  7.82   Sodium 135 - 145 mmol/L 136  140  141   Potassium 3.5 - 5.1 mmol/L 4.3  4.1  4.0   Chloride 98 - 111 mmol/L 103  102  102   CO2 22 - 32 mmol/L 30  23  22    Calcium 8.9 - 10.3 mg/dL 9.3  9.1  9.2   Total Protein 6.5 - 8.1 g/dL 7.8   7.3   Total Bilirubin 0.3 - 1.2 mg/dL 0.4   <9.5   Alkaline Phos 38 - 126 U/L 93   92   AST 15 - 41 U/L 16   17   ALT 0 - 44 U/L 10   11     RADIOGRAPHIC STUDIES: CUP PACEART INCLINIC DEVICE CHECK  Result Date: 03/18/2023 Pacemaker check in clinic. Normal device function. Thresholds, sensing, impedances consistent with previous measurements. Device programmed to maximize longevity, + short mode switches, no EGMs, no high ventricular rates noted. Device programmed at appropriate safety margins. Histogram distribution appropriate for patient activity level. Device programmed to optimize intrinsic conduction. Estimated longevity __5 years_. Patient enrolled in remote follow-up. Patient education completed.   ASSESSMENT & PLAN Braylinn Gulden 77 y.o. female with medical history significant for iron deficiency anemia 2/2 to GI bleed who presents for a  follow up visit.   After review the labs, the records, discussion the patient the findings most consistent with an iron deficiency anemia responding to IV iron therapy.  Given the macrocytosis I would recommend that the patient d/c ETOH use while she continues vitamin B12 and folate therapy.  She also reports that she drinks  approximately 1 glass of wine per night which may be partially to blame for her macrocytosis.  We will plan to have her repeat labs with clinic visit in 6 months The patient voiced understanding of this plan moving forward.  # Iron Deficiency Anemia 2/2 to GI Bleed   -- findings were consistent with persistent iron deficiency anemia despite adequate PO iron therapy --labs today show white blood cell 4.5, hemoglobin 10.2, MCV 80, and platelets of 223 --patient is s/p  IV feraheme q 7 days x 2 doses on 2/4-2/10/22. Most recently patient received 2 units of PRBC on 03/20/2023 and IV feraheme is scheduled for tomorrow, with another dose next week. --patient follows with Dr. Dulce Sellar at [facility].  --continue PO ferrous sulfate  daily. Take with a source of Vitamin C (orange juice best)  --RTC in 2 months with interval labs in 4 weeks.   #Macrocytosis-resolved.  --given the timing of this macrocytosis (right around when she started drinking 1 glass of wine 3-4 x per week), ETOH could be the cause of this macrocytosis. She has decreased to 2 glasses per week.  --Hgb 10.2 ,MCV 80.0. Plt 223 --less likely secondary to deficiency with folate or vitamin b12 as she has been supplementing these nutrients --potentially represent robust reticulocytosis, but ETOH seems more likely at this time.   --patient not currently on any medications known to cause macrocytosis.   No orders of the defined types were placed in this encounter.  All questions were answered. The patient knows to call the clinic with any problems, questions or concerns.  A total of more than 30 minutes were spent  on this encounter and over half of that time was spent on counseling and coordination of care as outlined above.   Ulysees Barns, MD Department of Hematology/Oncology [facility] at [facility] Phone: [phone] Pager: [phone] Email: [email or web address]  04/02/2023 4:48 PM

## 2023-04-02 ENCOUNTER — Inpatient Hospital Stay (HOSPITAL_BASED_OUTPATIENT_CLINIC_OR_DEPARTMENT_OTHER): Payer: 59 | Admitting: Hematology and Oncology

## 2023-04-02 ENCOUNTER — Other Ambulatory Visit: Payer: Self-pay

## 2023-04-02 ENCOUNTER — Inpatient Hospital Stay: Payer: 59

## 2023-04-02 VITALS — BP 208/84 | HR 60 | Temp 97.7°F | Resp 14 | Wt 124.6 lb

## 2023-04-02 DIAGNOSIS — D7589 Other specified diseases of blood and blood-forming organs: Secondary | ICD-10-CM

## 2023-04-02 DIAGNOSIS — D500 Iron deficiency anemia secondary to blood loss (chronic): Secondary | ICD-10-CM | POA: Diagnosis not present

## 2023-04-02 LAB — CMP (CANCER CENTER ONLY)
ALT: 10 U/L (ref 0–44)
AST: 16 U/L (ref 15–41)
Albumin: 4.1 g/dL (ref 3.5–5.0)
Alkaline Phosphatase: 93 U/L (ref 38–126)
Anion gap: 3 — ABNORMAL LOW (ref 5–15)
BUN: 24 mg/dL — ABNORMAL HIGH (ref 8–23)
CO2: 30 mmol/L (ref 22–32)
Calcium: 9.3 mg/dL (ref 8.9–10.3)
Chloride: 103 mmol/L (ref 98–111)
Creatinine: 1.28 mg/dL — ABNORMAL HIGH (ref 0.44–1.00)
GFR, Estimated: 43 mL/min — ABNORMAL LOW (ref >60)
Glucose, Bld: 104 mg/dL — ABNORMAL HIGH (ref 70–99)
Potassium: 4.3 mmol/L (ref 3.5–5.1)
Sodium: 136 mmol/L (ref 135–145)
Total Bilirubin: 0.4 mg/dL (ref 0.3–1.2)
Total Protein: 7.8 g/dL (ref 6.5–8.1)

## 2023-04-02 LAB — RETIC PANEL
Immature Retic Fract: 40.1 % — ABNORMAL HIGH (ref 2.3–15.9)
RBC.: 4.18 MIL/uL (ref 3.87–5.11)
Retic Count, Absolute: 46.4 10*3/uL (ref 19.0–186.0)
Retic Ct Pct: 1.1 % (ref 0.4–3.1)
Reticulocyte Hemoglobin: 29.8 pg (ref >27.9)

## 2023-04-02 LAB — CBC WITH DIFFERENTIAL (CANCER CENTER ONLY)
Abs Immature Granulocytes: 0.01 10*3/uL (ref 0.00–0.07)
Basophils Absolute: 0 10*3/uL (ref 0.0–0.1)
Basophils Relative: 1 %
Eosinophils Absolute: 0.1 10*3/uL (ref 0.0–0.5)
Eosinophils Relative: 2 %
HCT: 33.9 % — ABNORMAL LOW (ref 36.0–46.0)
Hemoglobin: 10.2 g/dL — ABNORMAL LOW (ref 12.0–15.0)
Immature Granulocytes: 0 %
Lymphocytes Relative: 33 %
Lymphs Abs: 1.5 10*3/uL (ref 0.7–4.0)
MCH: 24.1 pg — ABNORMAL LOW (ref 26.0–34.0)
MCHC: 30.1 g/dL (ref 30.0–36.0)
MCV: 80 fL (ref 80.0–100.0)
Monocytes Absolute: 0.3 10*3/uL (ref 0.1–1.0)
Monocytes Relative: 8 %
Neutro Abs: 2.6 10*3/uL (ref 1.7–7.7)
Neutrophils Relative %: 56 %
Platelet Count: 223 10*3/uL (ref 150–400)
RBC: 4.24 MIL/uL (ref 3.87–5.11)
RDW: 21.1 % — ABNORMAL HIGH (ref 11.5–15.5)
WBC Count: 4.5 10*3/uL (ref 4.0–10.5)
nRBC: 0 % (ref 0.0–0.2)

## 2023-04-02 LAB — IRON AND IRON BINDING CAPACITY (CC-WL,HP ONLY)
Iron: 32 ug/dL (ref 28–170)
Saturation Ratios: 6 % — ABNORMAL LOW (ref 10.4–31.8)
TIBC: 549 ug/dL — ABNORMAL HIGH (ref 250–450)
UIBC: 517 ug/dL — ABNORMAL HIGH (ref 148–442)

## 2023-04-02 LAB — FERRITIN: Ferritin: 7 ng/mL — ABNORMAL LOW (ref 11–307)

## 2023-04-03 ENCOUNTER — Ambulatory Visit (INDEPENDENT_AMBULATORY_CARE_PROVIDER_SITE_OTHER): Payer: 59

## 2023-04-03 ENCOUNTER — Encounter: Payer: Self-pay | Admitting: Hematology and Oncology

## 2023-04-03 ENCOUNTER — Ambulatory Visit (INDEPENDENT_AMBULATORY_CARE_PROVIDER_SITE_OTHER): Payer: 59 | Admitting: Orthopaedic Surgery

## 2023-04-03 VITALS — BP 150/77 | HR 60 | Temp 97.9°F | Resp 18 | Ht 62.0 in | Wt 124.8 lb

## 2023-04-03 DIAGNOSIS — D500 Iron deficiency anemia secondary to blood loss (chronic): Secondary | ICD-10-CM

## 2023-04-03 DIAGNOSIS — K922 Gastrointestinal hemorrhage, unspecified: Secondary | ICD-10-CM

## 2023-04-03 DIAGNOSIS — G8929 Other chronic pain: Secondary | ICD-10-CM

## 2023-04-03 DIAGNOSIS — M5441 Lumbago with sciatica, right side: Secondary | ICD-10-CM

## 2023-04-03 DIAGNOSIS — D62 Acute posthemorrhagic anemia: Secondary | ICD-10-CM

## 2023-04-03 DIAGNOSIS — M5442 Lumbago with sciatica, left side: Secondary | ICD-10-CM

## 2023-04-03 MED ORDER — DIPHENHYDRAMINE HCL 25 MG PO CAPS
25.0000 mg | ORAL_CAPSULE | Freq: Once | ORAL | Status: AC
  Administered 2023-04-03: 25 mg via ORAL
  Filled 2023-04-03: qty 1

## 2023-04-03 MED ORDER — ACETAMINOPHEN 325 MG PO TABS
650.0000 mg | ORAL_TABLET | Freq: Once | ORAL | Status: AC
  Administered 2023-04-03: 650 mg via ORAL
  Filled 2023-04-03: qty 2

## 2023-04-03 MED ORDER — PREDNISONE 5 MG (21) PO TBPK: ORAL_TABLET | ORAL | 0 refills | Status: AC

## 2023-04-03 MED ORDER — METHOCARBAMOL 750 MG PO TABS: 750.0000 mg | ORAL_TABLET | Freq: Two times a day (BID) | ORAL | 1 refills | Status: AC | PRN

## 2023-04-03 MED ORDER — SODIUM CHLORIDE 0.9 % IV SOLN
510.0000 mg | Freq: Once | INTRAVENOUS | Status: AC
  Administered 2023-04-03: 510 mg via INTRAVENOUS
  Filled 2023-04-03: qty 17

## 2023-04-03 NOTE — Progress Notes (Signed)
Diagnosis: Iron Deficiency Anemia  Provider:  Chilton Greathouse MD  Procedure: IV Infusion  IV Type: Peripheral, IV Location: R Forearm  Venofer (Iron Sucrose), Dose: 510 mg  Infusion Start Time: 1406  Infusion Stop Time: 1423  Post Infusion IV Care: Observation period completed and Peripheral IV Discontinued  Discharge: Condition: Good, Destination: Home . AVS Provided  Performed by:  Garnette Czech, RN

## 2023-04-03 NOTE — Progress Notes (Signed)
Office Visit Note   Patient: Nicole Mccormick           Date of Birth: 01/13/1946           MRN: [id number] Visit Date: 04/03/2023              Requested by: Raymon Mutton., FNP [address] [place],  [place] [address] PCP: Raymon Mutton., FNP   Assessment & Plan: Visit Diagnoses:  1. Chronic bilateral low back pain with bilateral sciatica     Plan: Impression is chronic low back pain with bilateral lower extremity sciatica.  At this point, her symptoms have been ongoing for several years and she has failed long-term relief with medications and physical therapy.  I would like to go ahead and order an MRI of the lumbar spine to assess for structural abnormalities.  We will call her with the results.  I will send in refills of both the steroid and muscle relaxer to help in the short-term.  Follow-Up Instructions: Return if symptoms worsen or fail to improve.   Orders:  Orders Placed This Encounter  Procedures   XR Lumbar Spine 2-3 Views   Meds ordered this encounter  Medications   predniSONE (STERAPRED UNI-PAK 21 TAB) 5 MG (21) TBPK tablet    Sig: Take as directed    Dispense:  21 tablet    Refill:  0   methocarbamol (ROBAXIN-750) 750 MG tablet    Sig: Take 1 tablet (750 mg total) by mouth 2 (two) times daily as needed for muscle spasms.    Dispense:  20 tablet    Refill:  1      Procedures: No procedures performed   Clinical Data: No additional findings.   Subjective: Chief Complaint  Patient presents with   Lower Back - Pain    HPI patient is a pleasant 77 year old female who comes in today with recurrent bilateral low back pain and bilateral lower extremity radiculopathy.  Symptoms have been ongoing for years but have been intermittent until the past 2 months.  She denies any new injury or change in activity.  The symptoms are constant but worse when she goes from a lying to standing position as well as when she is walking.  She has associated cramps and weakness  to both legs.  She is not currently taking any medication for this.  She does note numbness at times to both legs.  No bowel or bladder change or saddle paresthesias.  She was seen by [place] for the same problem a few years ago where she was started on a steroid pack and muscle relaxer and sent to physical therapy.  She notes improvement with medication but not with physical therapy.  Review of Systems as detailed in HPI.  All others reviewed and are negative.   Objective: Vital Signs: There were no vitals taken for this visit.  Physical Exam well-developed well-nourished female no acute distress.  Alert and oriented x 3.  Ortho Exam lumbar spine exam: She does have lower lumbar spinous tenderness as well as tenderness to the paraspinous musculature.  Increased pain with lumbar flexion, extension and right-sided rotation.  Positive straight leg raise both sides.  Full strength throughout.  She is neurovascular intact distally.  Specialty Comments:  No specialty comments available.  Imaging: XR Lumbar Spine 2-3 Views  Result Date: 04/03/2023 X-rays demonstrate multilevel degenerative changes with associated scoliosis    PMFS History: Patient Active Problem List   Diagnosis Date Noted  Chronic blood loss anemia 03/26/2023   History of herpes genitalis 03/25/2023   Hyperlipidemia 09/12/2021   Pacemaker 09/12/2021   Aortic stenosis 08/17/2021   Knee pain, right 08/17/2021   Iron deficiency anemia due to chronic blood loss 01/03/2021   Unspecified severe protein-calorie malnutrition 11/14/2020   Acute blood loss anemia 10/30/2020   AVM (arteriovenous malformation) of stomach, acquired 08/26/2020   COPD (chronic obstructive pulmonary disease) 08/24/2020   Arthritis 08/23/2020   Dyspnea    Shortness of breath    Severe anemia 06/03/2020   Osteoarthritis of wrist 04/28/2019   Osteoarthritis of glenohumeral joint 04/28/2019   Chronic arthralgias of knees and hips 07/06/2018   Sick sinus  syndrome 02/28/2016   Hypertensive cardiovascular disease 02/28/2016   Bilateral carotid bruits 02/12/2016   LVH (left ventricular hypertrophy) 01/15/2016   GI bleed 11/07/2015   Anxiety 07/22/2015   Seasonal allergies 07/22/2015   Asthma, chronic 07/22/2015   Health care maintenance 02/17/2015   Hepatitis C 08/07/2014   Back pain 10/28/2012   LGSIL Pap smear of vagina 01/16/2012   Anal condylomata 11/18/2011   CAD (coronary artery disease) 08/07/2011   Essential hypertension, benign 12/25/2010   Human immunodeficiency virus (HIV) disease 01/23/2007   TOBACCO ABUSE 01/23/2007   CATARACT NEC 01/23/2007   Gastroesophageal reflux disease 01/23/2007   Past Medical History:  Diagnosis Date   Abnormal Pap smear    Aortic stenosis    TTE 2021 with mean gradient   Arthritis    Asthma    AVM (arteriovenous malformation)    Bradycardia    Complication of anesthesia    "they have a hard time bringing me back" (11/08/2015)   Coronary artery disease    High grade RCA disease; diagnosed during acute GIB; on medical therapy   Dynamic left ventricular outflow obstruction    GERD (gastroesophageal reflux disease)    previously on aciphex, discontinued november 2012  because patient asymptomatic, and concern about interference with HIV meds.  May try pepcid in the future if symptoms return   Heart murmur    Hepatitis C antibody test positive    HIV infection    diagnosed before 2008   Hyperlipidemia 09/12/2021   Hypertension    Hypertensive heart disease    Iron deficiency anemia    Ferritin = 2 in november 2012, started on iron supplemenation   Pacemaker    a. [employer] PPM 01/2018.   Seizures    Symptomatic anemia 08/24/2020   Varicosities     Family History  Problem Relation Age of Onset   Diabetes Mother    Ovarian cancer Sister    Colon cancer Sister     Past Surgical History:  Procedure Laterality Date   ABDOMINAL HYSTERECTOMY     CARDIAC CATHETERIZATION N/A  11/08/2015   Procedure: Left Heart Cath and Coronary Angiography;  Surgeon: Corky Crafts, MD;  Location: [facility] INVASIVE CV LAB;  Service: Cardiovascular;  Laterality: N/A;   COLONOSCOPY  10/13/11   small adenoma, anal condyloma   COLONOSCOPY  10/13/2011   Procedure: COLONOSCOPY;  Surgeon: Iva Boop, MD;  Location: [facility] ENDOSCOPY;  Service: Endoscopy;  Laterality: N/A;   COLONOSCOPY N/A 09/14/2014   Procedure: COLONOSCOPY;  Surgeon: Willis Modena, MD;  Location: [facility] ENDOSCOPY;  Service: Endoscopy;  Laterality: N/A;   COLONOSCOPY WITH PROPOFOL N/A 06/05/2020   Procedure: COLONOSCOPY WITH PROPOFOL;  Surgeon: Willis Modena, MD;  Location: [facility] ENDOSCOPY;  Service: Endoscopy;  Laterality: N/A;   ENTEROSCOPY N/A 10/31/2020  Procedure: ENTEROSCOPY;  Surgeon: Kathi Der, MD;  Location: [facility] ENDOSCOPY;  Service: Gastroenterology;  Laterality: N/A;   ESOPHAGOGASTRODUODENOSCOPY  10/13/11   small hiatal hernia   ESOPHAGOGASTRODUODENOSCOPY  10/13/2011   Procedure: ESOPHAGOGASTRODUODENOSCOPY (EGD);  Surgeon: Iva Boop, MD;  Location: [facility] ENDOSCOPY;  Service: Endoscopy;  Laterality: N/A;   ESOPHAGOGASTRODUODENOSCOPY N/A 09/14/2014   Procedure: ESOPHAGOGASTRODUODENOSCOPY (EGD);  Surgeon: Willis Modena, MD;  Location: [facility] ENDOSCOPY;  Service: Endoscopy;  Laterality: N/A;   ESOPHAGOGASTRODUODENOSCOPY (EGD) WITH PROPOFOL N/A 06/05/2020   Procedure: ESOPHAGOGASTRODUODENOSCOPY (EGD) WITH PROPOFOL;  Surgeon: Willis Modena, MD;  Location: [facility] ENDOSCOPY;  Service: Endoscopy;  Laterality: N/A;   GIVENS CAPSULE STUDY N/A 09/14/2014   Procedure: GIVENS CAPSULE STUDY;  Surgeon: Willis Modena, MD;  Location: [facility] ENDOSCOPY;  Service: Endoscopy;  Laterality: N/A;   GIVENS CAPSULE STUDY N/A 08/26/2020   Procedure: GIVENS CAPSULE STUDY;  Surgeon: Graylin Shiver, MD;  Location: [facility] ENDOSCOPY;  Service: Endoscopy;  Laterality: N/A;   HOT HEMOSTASIS N/A 10/31/2020   Procedure: HOT HEMOSTASIS (ARGON PLASMA  COAGULATION/BICAP);  Surgeon: Kathi Der, MD;  Location: [facility] ENDOSCOPY;  Service: Gastroenterology;  Laterality: N/A;   PACEMAKER IMPLANT N/A 02/05/2018   Procedure: PACEMAKER IMPLANT;  Surgeon: Hillis Range, MD;  Location: [facility] INVASIVE CV LAB;  Service: Cardiovascular;  Laterality: N/A;   POLYPECTOMY  06/05/2020   Procedure: POLYPECTOMY;  Surgeon: Willis Modena, MD;  Location: [facility] ENDOSCOPY;  Service: Endoscopy;;   Social History   Occupational History   Occupation: retired  Tobacco Use   Smoking status: Every Day    Packs/day: 1.00    Years: 60.00    Additional pack years: 0.00    Total pack years: 60.00    Types: Cigarettes   Smokeless tobacco: Never   Tobacco comments:    Sometimes less.  Vaping Use   Vaping Use: Never used  Substance and Sexual Activity   Alcohol use: Yes    Alcohol/week: 10.0 standard drinks of alcohol    Types: 10 Glasses of wine per week    Comment: wine sometimes.   Drug use: No   Sexual activity: Yes    Partners: Male    Birth control/protection: Surgical    Comment: pt. given condoms 08/23/20

## 2023-04-04 ENCOUNTER — Encounter: Payer: Self-pay | Admitting: Hematology and Oncology

## 2023-04-05 ENCOUNTER — Other Ambulatory Visit: Payer: Self-pay | Admitting: Hematology and Oncology

## 2023-04-05 DIAGNOSIS — D500 Iron deficiency anemia secondary to blood loss (chronic): Secondary | ICD-10-CM

## 2023-04-07 ENCOUNTER — Inpatient Hospital Stay: Payer: 59

## 2023-04-07 ENCOUNTER — Other Ambulatory Visit: Payer: Self-pay

## 2023-04-07 DIAGNOSIS — D500 Iron deficiency anemia secondary to blood loss (chronic): Secondary | ICD-10-CM | POA: Diagnosis not present

## 2023-04-07 LAB — CBC WITH DIFFERENTIAL (CANCER CENTER ONLY)
Abs Immature Granulocytes: 0.03 10*3/uL (ref 0.00–0.07)
Basophils Absolute: 0 10*3/uL (ref 0.0–0.1)
Basophils Relative: 0 %
Eosinophils Absolute: 0.1 10*3/uL (ref 0.0–0.5)
Eosinophils Relative: 1 %
HCT: 32.8 % — ABNORMAL LOW (ref 36.0–46.0)
Hemoglobin: 10 g/dL — ABNORMAL LOW (ref 12.0–15.0)
Immature Granulocytes: 1 %
Lymphocytes Relative: 18 %
Lymphs Abs: 1.1 10*3/uL (ref 0.7–4.0)
MCH: 24.9 pg — ABNORMAL LOW (ref 26.0–34.0)
MCHC: 30.5 g/dL (ref 30.0–36.0)
MCV: 81.8 fL (ref 80.0–100.0)
Monocytes Absolute: 0.4 10*3/uL (ref 0.1–1.0)
Monocytes Relative: 7 %
Neutro Abs: 4.5 10*3/uL (ref 1.7–7.7)
Neutrophils Relative %: 73 %
Platelet Count: 235 10*3/uL (ref 150–400)
RBC: 4.01 MIL/uL (ref 3.87–5.11)
RDW: 23.1 % — ABNORMAL HIGH (ref 11.5–15.5)
WBC Count: 6 10*3/uL (ref 4.0–10.5)
nRBC: 0 % (ref 0.0–0.2)

## 2023-04-08 ENCOUNTER — Other Ambulatory Visit: Payer: Medicare Other

## 2023-04-08 ENCOUNTER — Ambulatory Visit: Payer: Medicare Other | Admitting: Hematology and Oncology

## 2023-04-10 ENCOUNTER — Ambulatory Visit (INDEPENDENT_AMBULATORY_CARE_PROVIDER_SITE_OTHER): Payer: 59 | Admitting: *Deleted

## 2023-04-10 VITALS — BP 127/80 | HR 54 | Temp 97.9°F | Resp 16 | Ht 62.0 in | Wt 122.8 lb

## 2023-04-10 DIAGNOSIS — D500 Iron deficiency anemia secondary to blood loss (chronic): Secondary | ICD-10-CM

## 2023-04-10 DIAGNOSIS — D62 Acute posthemorrhagic anemia: Secondary | ICD-10-CM

## 2023-04-10 DIAGNOSIS — K922 Gastrointestinal hemorrhage, unspecified: Secondary | ICD-10-CM

## 2023-04-10 DIAGNOSIS — G8929 Other chronic pain: Secondary | ICD-10-CM

## 2023-04-10 MED ORDER — FERUMOXYTOL INJECTION 510 MG/17 ML
510.0000 mg | Freq: Once | INTRAVENOUS | Status: DC
  Filled 2023-04-10: qty 17

## 2023-04-10 MED ORDER — ACETAMINOPHEN 325 MG PO TABS: 650.0000 mg | ORAL_TABLET | Freq: Once | ORAL | Status: DC

## 2023-04-10 MED ORDER — DIPHENHYDRAMINE HCL 25 MG PO CAPS: 25.0000 mg | ORAL_CAPSULE | Freq: Once | ORAL | Status: DC

## 2023-04-10 MED ORDER — SODIUM CHLORIDE 0.9 % IV SOLN
510.0000 mg | Freq: Once | INTRAVENOUS | Status: AC
  Administered 2023-04-10: 510 mg via INTRAVENOUS
  Filled 2023-04-10: qty 17

## 2023-04-10 NOTE — Progress Notes (Signed)
Diagnosis: Iron Deficiency Anemia  Provider:  Chilton Greathouse MD  Procedure: IV Infusion  IV Type: Peripheral, IV Location: R Forearm  Feraheme (Ferumoxytol), Dose: 510 mg  Infusion Start Time: 0939 am  Infusion Stop Time: 1000 am  Post Infusion IV Care: Observation period completed and Peripheral IV Discontinued  Discharge: Condition: Good, Destination: Home . AVS Provided  Performed by:  Forrest Moron, RN

## 2023-04-14 ENCOUNTER — Inpatient Hospital Stay: Payer: 59 | Attending: Hematology and Oncology

## 2023-04-14 ENCOUNTER — Other Ambulatory Visit: Payer: Self-pay

## 2023-04-14 ENCOUNTER — Telehealth: Payer: Self-pay | Admitting: *Deleted

## 2023-04-14 DIAGNOSIS — K219 Gastro-esophageal reflux disease without esophagitis: Secondary | ICD-10-CM | POA: Insufficient documentation

## 2023-04-14 DIAGNOSIS — I119 Hypertensive heart disease without heart failure: Secondary | ICD-10-CM | POA: Insufficient documentation

## 2023-04-14 DIAGNOSIS — J45909 Unspecified asthma, uncomplicated: Secondary | ICD-10-CM | POA: Diagnosis not present

## 2023-04-14 DIAGNOSIS — I350 Nonrheumatic aortic (valve) stenosis: Secondary | ICD-10-CM | POA: Insufficient documentation

## 2023-04-14 DIAGNOSIS — Z7951 Long term (current) use of inhaled steroids: Secondary | ICD-10-CM | POA: Diagnosis not present

## 2023-04-14 DIAGNOSIS — E785 Hyperlipidemia, unspecified: Secondary | ICD-10-CM | POA: Diagnosis not present

## 2023-04-14 DIAGNOSIS — I2510 Atherosclerotic heart disease of native coronary artery without angina pectoris: Secondary | ICD-10-CM | POA: Insufficient documentation

## 2023-04-14 DIAGNOSIS — D500 Iron deficiency anemia secondary to blood loss (chronic): Secondary | ICD-10-CM | POA: Insufficient documentation

## 2023-04-14 DIAGNOSIS — Z79899 Other long term (current) drug therapy: Secondary | ICD-10-CM | POA: Diagnosis not present

## 2023-04-14 DIAGNOSIS — F17210 Nicotine dependence, cigarettes, uncomplicated: Secondary | ICD-10-CM | POA: Diagnosis not present

## 2023-04-14 LAB — CBC WITH DIFFERENTIAL (CANCER CENTER ONLY)
Abs Immature Granulocytes: 0.01 10*3/uL (ref 0.00–0.07)
Basophils Absolute: 0 10*3/uL (ref 0.0–0.1)
Basophils Relative: 0 %
Eosinophils Absolute: 0.1 10*3/uL (ref 0.0–0.5)
Eosinophils Relative: 1 %
HCT: 42.3 % (ref 36.0–46.0)
Hemoglobin: 13.5 g/dL (ref 12.0–15.0)
Immature Granulocytes: 0 %
Lymphocytes Relative: 27 %
Lymphs Abs: 1.5 10*3/uL (ref 0.7–4.0)
MCH: 27.4 pg (ref 26.0–34.0)
MCHC: 31.9 g/dL (ref 30.0–36.0)
MCV: 86 fL (ref 80.0–100.0)
Monocytes Absolute: 0.5 10*3/uL (ref 0.1–1.0)
Monocytes Relative: 8 %
Neutro Abs: 3.6 10*3/uL (ref 1.7–7.7)
Neutrophils Relative %: 64 %
Platelet Count: 209 10*3/uL (ref 150–400)
RBC: 4.92 MIL/uL (ref 3.87–5.11)
RDW: 29.7 % — ABNORMAL HIGH (ref 11.5–15.5)
WBC Count: 5.7 10*3/uL (ref 4.0–10.5)
nRBC: 0 % (ref 0.0–0.2)

## 2023-04-14 NOTE — Telephone Encounter (Signed)
-----   Message from Briant Cedar, PA-C sent at 04/14/2023  2:12 PM EDT ----- Labs show no evidence of anemia. Continue PO iron.    ----- Message ----- From: Leory Plowman, Lab In [place] Sent: 04/14/2023   9:29 AM EDT To: Jaci Standard, MD

## 2023-04-14 NOTE — Telephone Encounter (Signed)
TCT patient regarding recent lab results. Advised that she is no longer anemic. With a HGB 13.5. She has seen her cardiologist-all is well there. She sees her GI doctor the first week in June. Advised that she does not need to come for labs next week but to come on 04/29/23 for labs and Dr. Leonides Schanz. She voiced understanding. She is pleased with lab results and says she feels much better.

## 2023-04-16 ENCOUNTER — Encounter: Payer: Self-pay | Admitting: Hematology and Oncology

## 2023-04-21 ENCOUNTER — Inpatient Hospital Stay: Payer: 59

## 2023-04-29 ENCOUNTER — Inpatient Hospital Stay (HOSPITAL_BASED_OUTPATIENT_CLINIC_OR_DEPARTMENT_OTHER): Payer: 59 | Admitting: Hematology and Oncology

## 2023-04-29 ENCOUNTER — Other Ambulatory Visit: Payer: Self-pay | Admitting: Hematology and Oncology

## 2023-04-29 ENCOUNTER — Inpatient Hospital Stay: Payer: 59

## 2023-04-29 ENCOUNTER — Other Ambulatory Visit: Payer: Self-pay

## 2023-04-29 DIAGNOSIS — D7589 Other specified diseases of blood and blood-forming organs: Secondary | ICD-10-CM

## 2023-04-29 DIAGNOSIS — D500 Iron deficiency anemia secondary to blood loss (chronic): Secondary | ICD-10-CM

## 2023-04-29 LAB — IRON AND IRON BINDING CAPACITY (CC-WL,HP ONLY)
Iron: 116 ug/dL (ref 28–170)
Saturation Ratios: 34 % — ABNORMAL HIGH (ref 10.4–31.8)
TIBC: 346 ug/dL (ref 250–450)
UIBC: 230 ug/dL (ref 148–442)

## 2023-04-29 LAB — CMP (CANCER CENTER ONLY)
ALT: 12 U/L (ref 0–44)
AST: 17 U/L (ref 15–41)
Albumin: 4.1 g/dL (ref 3.5–5.0)
Alkaline Phosphatase: 67 U/L (ref 38–126)
Anion gap: 4 — ABNORMAL LOW (ref 5–15)
BUN: 28 mg/dL — ABNORMAL HIGH (ref 8–23)
CO2: 31 mmol/L (ref 22–32)
Calcium: 9 mg/dL (ref 8.9–10.3)
Chloride: 104 mmol/L (ref 98–111)
Creatinine: 0.89 mg/dL (ref 0.44–1.00)
GFR, Estimated: >60 mL/min (ref >60)
Glucose, Bld: 96 mg/dL (ref 70–99)
Potassium: 4.1 mmol/L (ref 3.5–5.1)
Sodium: 139 mmol/L (ref 135–145)
Total Bilirubin: 0.4 mg/dL (ref 0.3–1.2)
Total Protein: 7.3 g/dL (ref 6.5–8.1)

## 2023-04-29 LAB — RETIC PANEL
Immature Retic Fract: 19.8 % — ABNORMAL HIGH (ref 2.3–15.9)
RBC.: 4.24 MIL/uL (ref 3.87–5.11)
Retic Count, Absolute: 41.6 10*3/uL (ref 19.0–186.0)
Retic Ct Pct: 1 % (ref 0.4–3.1)
Reticulocyte Hemoglobin: 39.4 pg (ref >27.9)

## 2023-04-29 LAB — CBC WITH DIFFERENTIAL (CANCER CENTER ONLY)
Abs Immature Granulocytes: 0.01 10*3/uL (ref 0.00–0.07)
Basophils Absolute: 0 10*3/uL (ref 0.0–0.1)
Basophils Relative: 1 %
Eosinophils Absolute: 0.1 10*3/uL (ref 0.0–0.5)
Eosinophils Relative: 2 %
HCT: 38.5 % (ref 36.0–46.0)
Hemoglobin: 12.5 g/dL (ref 12.0–15.0)
Immature Granulocytes: 0 %
Lymphocytes Relative: 31 %
Lymphs Abs: 1.3 10*3/uL (ref 0.7–4.0)
MCH: 29.2 pg (ref 26.0–34.0)
MCHC: 32.5 g/dL (ref 30.0–36.0)
MCV: 90 fL (ref 80.0–100.0)
Monocytes Absolute: 0.4 10*3/uL (ref 0.1–1.0)
Monocytes Relative: 8 %
Neutro Abs: 2.5 10*3/uL (ref 1.7–7.7)
Neutrophils Relative %: 58 %
Platelet Count: 149 10*3/uL — ABNORMAL LOW (ref 150–400)
RBC: 4.28 MIL/uL (ref 3.87–5.11)
Smear Review: NORMAL
WBC Count: 4.3 10*3/uL (ref 4.0–10.5)
nRBC: 0 % (ref 0.0–0.2)

## 2023-04-29 LAB — FERRITIN: Ferritin: 141 ng/mL (ref 11–307)

## 2023-04-29 MED ORDER — TRAMADOL HCL 50 MG PO TABS: 50.0000 mg | ORAL_TABLET | Freq: Four times a day (QID) | ORAL | 0 refills | Status: DC | PRN

## 2023-04-29 NOTE — Progress Notes (Signed)
[facility] [phone]   [phone]  PROGRESS NOTE  Patient Care Team: Raymon Mutton., FNP as PCP - General (Family Medicine) Ninetta Lights Lacretia Leigh, MD as PCP - Infectious Diseases (Infectious Diseases) Mealor, Roberts Gaudy, MD as PCP - Electrophysiology (Cardiology) Willis Modena, MD as Consulting Physician (Gastroenterology)  Hematological/Oncological History # Iron Deficiency Anemia 2/2 to GI Bleed  10/29/2020: patient presented the ED with weakness and was found to have a Hgb of 4.1 11/01/2020: GI evaluation showed numerous bleeding/nonbleeding AVMS. Cauterization performed.  12/18/2020: Iron 32, TIBC 441, Sat 7%, Ferritin 31. Hgb 8.3, Plt 313, WBC 4.2, MCV 102.5.  01/03/2021: establish care with Dr. Leonides Schanz  2/4-2/09/2021: IV feraheme 510mg  x 2 doses. Received 1 unit of PRBC on 01/12/2021  08/01/2021: WBC 4.5, Hgb 12.1, MCV 104.9, Plt 223 02/06/2022: WBC 5.7, Hgb 13.0, MCV 104.8, Plt 196 03/20/2023: Hgb 6.8, WBC 3.1, MCV 75.9, ferritin 3, Iron sat 2%. Patient received 2 units of PRBC.   Interval History:  Nicole Mccormick 77 y.o. female with medical history significant for iron deficiency anemia 2/2 to GI bleed who presents for a follow up visit. The patient's last visit was on  04/02/2023  On exam today Nicole Mccormick notes she tolerated her IV iron infusions in late April/early May very well.  She will have any side effects as all the infusions.  She reports that she has not felt that good in a while and her energy is currently a 10 out of 10.  She reports her primary issue today is arthritis pain.  Is about 7 out of 10 and predominantly in her hands, knees, and back.  She reports she has not seen any overt signs of bleeding in the urine or the stool.  She denies any dark stools.  She reports that she is try to get an upcoming appointment with Dr. Dulce Sellar.  She does note occasionally she belches and can feel a metallic taste in her mouth.  Her weight has been stable  and she otherwise denies any fevers, chills, sweats, nausea, vomiting or diarrhea.  A full 10 point ROS is listed below.  MEDICAL HISTORY:  Past Medical History:  Diagnosis Date   Abnormal Pap smear    Aortic stenosis    TTE 2021 with mean gradient   Arthritis    Asthma    AVM (arteriovenous malformation)    Bradycardia    Complication of anesthesia    "they have a hard time bringing me back" (11/08/2015)   Coronary artery disease    High grade RCA disease; diagnosed during acute GIB; on medical therapy   Dynamic left ventricular outflow obstruction    GERD (gastroesophageal reflux disease)    previously on aciphex, discontinued november 2012  because patient asymptomatic, and concern about interference with HIV meds.  May try pepcid in the future if symptoms return   Heart murmur    Hepatitis C antibody test positive    HIV infection (HCC)    diagnosed before 2008   Hyperlipidemia 09/12/2021   Hypertension    Hypertensive heart disease    Iron deficiency anemia    Ferritin = 2 in november 2012, started on iron supplemenation   Pacemaker    a. St Jude PPM 01/2018.   Seizures (HCC)    Symptomatic anemia 08/24/2020   Varicosities     SURGICAL HISTORY: Past Surgical History:  Procedure Laterality Date   ABDOMINAL HYSTERECTOMY     CARDIAC CATHETERIZATION N/A 11/08/2015  Procedure: Left Heart Cath and Coronary Angiography;  Surgeon: Corky Crafts, MD;  Location: [facility] INVASIVE CV LAB;  Service: Cardiovascular;  Laterality: N/A;   COLONOSCOPY  10/13/11   small adenoma, anal condyloma   COLONOSCOPY  10/13/2011   Procedure: COLONOSCOPY;  Surgeon: Iva Boop, MD;  Location: [facility] ENDOSCOPY;  Service: Endoscopy;  Laterality: N/A;   COLONOSCOPY N/A 09/14/2014   Procedure: COLONOSCOPY;  Surgeon: Willis Modena, MD;  Location: [facility] ENDOSCOPY;  Service: Endoscopy;  Laterality: N/A;   COLONOSCOPY WITH PROPOFOL N/A 06/05/2020   Procedure: COLONOSCOPY WITH PROPOFOL;  Surgeon:  Willis Modena, MD;  Location: [facility] ENDOSCOPY;  Service: Endoscopy;  Laterality: N/A;   ENTEROSCOPY N/A 10/31/2020   Procedure: ENTEROSCOPY;  Surgeon: Kathi Der, MD;  Location: [facility] ENDOSCOPY;  Service: Gastroenterology;  Laterality: N/A;   ESOPHAGOGASTRODUODENOSCOPY  10/13/11   small hiatal hernia   ESOPHAGOGASTRODUODENOSCOPY  10/13/2011   Procedure: ESOPHAGOGASTRODUODENOSCOPY (EGD);  Surgeon: Iva Boop, MD;  Location: [facility] ENDOSCOPY;  Service: Endoscopy;  Laterality: N/A;   ESOPHAGOGASTRODUODENOSCOPY N/A 09/14/2014   Procedure: ESOPHAGOGASTRODUODENOSCOPY (EGD);  Surgeon: Willis Modena, MD;  Location: [facility] ENDOSCOPY;  Service: Endoscopy;  Laterality: N/A;   ESOPHAGOGASTRODUODENOSCOPY (EGD) WITH PROPOFOL N/A 06/05/2020   Procedure: ESOPHAGOGASTRODUODENOSCOPY (EGD) WITH PROPOFOL;  Surgeon: Willis Modena, MD;  Location: [facility] ENDOSCOPY;  Service: Endoscopy;  Laterality: N/A;   GIVENS CAPSULE STUDY N/A 09/14/2014   Procedure: GIVENS CAPSULE STUDY;  Surgeon: Willis Modena, MD;  Location: [facility] ENDOSCOPY;  Service: Endoscopy;  Laterality: N/A;   GIVENS CAPSULE STUDY N/A 08/26/2020   Procedure: GIVENS CAPSULE STUDY;  Surgeon: Graylin Shiver, MD;  Location: [facility] ENDOSCOPY;  Service: Endoscopy;  Laterality: N/A;   HOT HEMOSTASIS N/A 10/31/2020   Procedure: HOT HEMOSTASIS (ARGON PLASMA COAGULATION/BICAP);  Surgeon: Kathi Der, MD;  Location: [facility] ENDOSCOPY;  Service: Gastroenterology;  Laterality: N/A;   PACEMAKER IMPLANT N/A 02/05/2018   Procedure: PACEMAKER IMPLANT;  Surgeon: Hillis Range, MD;  Location: [facility] INVASIVE CV LAB;  Service: Cardiovascular;  Laterality: N/A;   POLYPECTOMY  06/05/2020   Procedure: POLYPECTOMY;  Surgeon: Willis Modena, MD;  Location: [facility] ENDOSCOPY;  Service: Endoscopy;;    SOCIAL HISTORY: Social History   Socioeconomic History   Marital status: Married    Spouse name: Not on file   Number of children: 6   Years of education: Not on file   Highest education level: Not  on file  Occupational History   Occupation: retired  Tobacco Use   Smoking status: Every Day    Packs/day: 1.00    Years: 60.00    Additional pack years: 0.00    Total pack years: 60.00    Types: Cigarettes   Smokeless tobacco: Never   Tobacco comments:    Sometimes less.  Vaping Use   Vaping Use: Never used  Substance and Sexual Activity   Alcohol use: Yes    Alcohol/week: 10.0 standard drinks of alcohol    Types: 10 Glasses of wine per week    Comment: wine sometimes.   Drug use: No   Sexual activity: Yes    Partners: Male    Birth control/protection: Surgical    Comment: pt. given condoms 08/23/20  Other Topics Concern   Not on file  Social History Narrative   Not on file   Social Determinants of Health   Financial Resource Strain: High Risk (03/26/2023)   Overall Financial Resource Strain (CARDIA)    Difficulty of Paying Living Expenses: Hard  Food Insecurity: No Food Insecurity (03/26/2023)  Hunger Vital Sign    Worried About Running Out of Food in the Last Year: Never true    Ran Out of Food in the Last Year: Never true  Transportation Needs: Unmet Transportation Needs (03/26/2023)   PRAPARE - Transportation    Lack of Transportation (Medical): Yes    Lack of Transportation (Non-Medical): Yes  Physical Activity: Inactive (03/26/2023)   Exercise Vital Sign    Days of Exercise per Week: 1 day    Minutes of Exercise per Session: 0 min  Stress: No Stress Concern Present (03/26/2023)   [employer] of Occupational Health - Occupational Stress Questionnaire    Feeling of Stress : Only a little  Social Connections: Socially Integrated (03/26/2023)   Social Connection and Isolation Panel [NHANES]    Frequency of Communication with Friends and Family: More than three times a week    Frequency of Social Gatherings with Friends and Family: More than three times a week    Attends Religious Services: More than 4 times per year    Active Member of [employer] or Organizations:  Yes    Attends Banker Meetings: 1 to 4 times per year    Marital Status: Married  Catering manager Violence: Not At Risk (03/26/2023)   Humiliation, Afraid, Rape, and Kick questionnaire    Fear of Current or Ex-Partner: No    Emotionally Abused: No    Physically Abused: No    Sexually Abused: No    FAMILY HISTORY: Family History  Problem Relation Age of Onset   Diabetes Mother    Ovarian cancer Sister    Colon cancer Sister     ALLERGIES:  is allergic to penicillins, penicillin g, and avelox [moxifloxacin hcl in nacl].  MEDICATIONS:  Current Outpatient Medications  Medication Sig Dispense Refill   acyclovir (ZOVIRAX) 400 MG tablet Take 1 tablet (400 mg total) by mouth daily as needed (for flare ups). 30 tablet 3   albuterol (PROAIR HFA) 108 (90 Base) MCG/ACT inhaler INHALE 2 PUFFS BY MOUTH EVERY 4 HOURS IF NEEDED FOR WHEEZING OR SHORTNESS OF BREATH (Patient taking differently: Inhale 2 puffs into the lungs every 4 (four) hours as needed for wheezing or shortness of breath.) 8.5 g 0   amLODipine (NORVASC) 10 MG tablet TAKE 1 TABLET BY MOUTH EVERY DAY 90 tablet 0   Ascorbic Acid (VITAMIN C) 1000 MG tablet Take 1,000 mg by mouth daily.     atorvastatin (LIPITOR) 40 MG tablet take 1 tablet by mouth once daily AT 6 PM (Patient taking differently: Take 40 mg by mouth every evening.) 90 tablet 0   BIKTARVY 50-200-25 MG TABS tablet TAKE 1 TABLET BY MOUTH EVERY DAY (Patient taking differently: Take 1 tablet by mouth daily.) 90 tablet 3   cholecalciferol (VITAMIN D3) 25 MCG (1000 UNIT) tablet Take 1,000 Units by mouth daily.     cyanocobalamin (VITAMIN B12) 1000 MCG tablet TAKE 1 TABLET BY MOUTH EVERY DAY 90 tablet 1   dextromethorphan (DELSYM) 30 MG/5ML liquid Take 30 mg by mouth as needed for cough.      diclofenac (VOLTAREN) 75 MG EC tablet Take 1 tablet (75 mg total) by mouth 2 (two) times daily as needed. Do not take until finished with steroid taper 60 tablet 2   Ensure  (ENSURE) Take 237 mLs by mouth 2 (two) times daily between meals. 5688 mL 5   escitalopram (LEXAPRO) 10 MG tablet Take 1 tablet (10 mg total) by mouth daily as needed (anxiety). 30 tablet  3   famotidine (PEPCID) 20 MG tablet Take 20 mg by mouth 2 (two) times daily.     Flaxseed, Linseed, (FLAX SEED OIL) 1000 MG CAPS Take 1,000 mg by mouth daily.     folic acid (FOLVITE) 1 MG tablet TAKE 1 TABLET BY MOUTH EVERY DAY 90 tablet 1   gabapentin (NEURONTIN) 300 MG capsule Take 300 mg by mouth 2 (two) times daily.     hydrochlorothiazide (HYDRODIURIL) 25 MG tablet Take 25 mg by mouth daily.      HYDROcodone-acetaminophen (NORCO/VICODIN) 5-325 MG tablet Take 1 tablet by mouth 2 (two) times daily as needed.     isosorbide mononitrate (IMDUR) 60 MG 24 hr tablet Take 1 tablet (60 mg total) by mouth daily. 90 tablet 1   lisinopril (ZESTRIL) 40 MG tablet Take 40 mg by mouth daily.     loratadine (CLARITIN) 10 MG tablet Take 10 mg by mouth daily.     meclizine (ANTIVERT) 25 MG tablet Take 25 mg by mouth as needed for dizziness.      methocarbamol (ROBAXIN) 500 MG tablet Take 1 tablet (500 mg total) by mouth 2 (two) times daily as needed. 20 tablet 1   methocarbamol (ROBAXIN-750) 750 MG tablet Take 1 tablet (750 mg total) by mouth 2 (two) times daily as needed for muscle spasms. 20 tablet 1   metoprolol succinate (TOPROL-XL) 50 MG 24 hr tablet TAKE 1 TABLET BY MOUTH AT BEDTIME 90 tablet 0   Omega-3 Fatty Acids (FISH OIL) 1000 MG CPDR Take 1,000 mg by mouth daily.     ondansetron (ZOFRAN-ODT) 4 MG disintegrating tablet 1 tablet on the tongue and allow to dissolve     pantoprazole (PROTONIX) 40 MG tablet Take 1 tablet (40 mg total) by mouth daily. 90 tablet 0   predniSONE (STERAPRED UNI-PAK 21 TAB) 5 MG (21) TBPK tablet Take as directed 21 tablet 0   spironolactone (ALDACTONE) 25 MG tablet TAKE 1 TABLET BY MOUTH EVERY DAY (Patient taking differently: Take 25 mg by mouth daily.) 30 tablet 0   SYMBICORT 160-4.5  MCG/ACT inhaler Inhale 2 puffs into the lungs 2 (two) times daily.     traMADol (ULTRAM) 50 MG tablet Take 1 tablet (50 mg total) by mouth every 6 (six) hours as needed. 60 tablet 0   vitamin A 16109 UNIT capsule Take 10,000 Units by mouth daily.     ferrous sulfate 325 (65 FE) MG EC tablet Take 1 tablet (325 mg total) by mouth in the morning and at bedtime. 180 tablet 0   No current facility-administered medications for this visit.   Facility-Administered Medications Ordered in Other Visits  Medication Dose Route Frequency Provider Last Rate Last Admin   acetaminophen (TYLENOL) tablet 650 mg  650 mg Oral Once Desma Mcgregor, RPH       diphenhydrAMINE (BENADRYL) capsule 25 mg  25 mg Oral Once Desma Mcgregor, [facility]       ferumoxytol [facility]) 510 mg in sodium chloride 0.9 % 100 mL IVPB  510 mg Intravenous Once Desma Mcgregor, [facility]        REVIEW OF SYSTEMS:   Constitutional: ( - ) fevers, ( - )  chills , ( - ) night sweats Eyes: ( - ) blurriness of vision, ( - ) double vision, ( - ) watery eyes Ears, nose, mouth, throat, and face: ( - ) mucositis, ( - ) sore throat Respiratory: ( - ) cough, ( - ) dyspnea, ( - ) wheezes Cardiovascular: ( - )  palpitation, ( - ) chest discomfort, ( - ) lower extremity swelling Gastrointestinal:  ( - ) nausea, ( - ) heartburn, ( - ) change in bowel habits Skin: ( - ) abnormal skin rashes Lymphatics: ( - ) new lymphadenopathy, ( - ) easy bruising Neurological: ( - ) numbness, ( - ) tingling, ( - ) new weaknesses Behavioral/Psych: ( - ) mood change, ( - ) new changes  All other systems were reviewed with the patient and are negative.  PHYSICAL EXAMINATION:  There were no vitals filed for this visit.   There were no vitals filed for this visit.    GENERAL: well appearing elderly African American female. alert, no distress and comfortable SKIN: skin color, texture, turgor are normal, no rashes or significant lesions EYES: conjunctiva are pink and non-injected,  sclera clear LUNGS: clear to auscultation and percussion with normal breathing effort HEART: regular rate & rhythm and no murmurs and no lower extremity edema Musculoskeletal: no cyanosis of digits and no clubbing  PSYCH: alert & oriented x 3, fluent speech NEURO: no focal motor/sensory deficits  LABORATORY DATA:  I have reviewed the data as listed    Latest Ref Rng & Units 04/29/2023    9:40 AM 04/14/2023    9:12 AM 04/07/2023    9:28 AM  CBC  WBC 4.0 - 10.5 K/uL 4.3  5.7  6.0   Hemoglobin 12.0 - 15.0 g/dL 16.1  09.6  04.5   Hematocrit 36.0 - 46.0 % 38.5  42.3  32.8   Platelets 150 - 400 K/uL 149  209  235        Latest Ref Rng & Units 04/29/2023    9:40 AM 04/02/2023   10:06 AM 03/18/2023   11:00 AM  CMP  Glucose 70 - 99 mg/dL 96  409  811   BUN 8 - 23 mg/dL 28  24  28    Creatinine 0.44 - 1.00 mg/dL 9.14  7.82  9.56   Sodium 135 - 145 mmol/L 139  136  140   Potassium 3.5 - 5.1 mmol/L 4.1  4.3  4.1   Chloride 98 - 111 mmol/L 104  103  102   CO2 22 - 32 mmol/L 31  30  23    Calcium 8.9 - 10.3 mg/dL 9.0  9.3  9.1   Total Protein 6.5 - 8.1 g/dL 7.3  7.8    Total Bilirubin 0.3 - 1.2 mg/dL 0.4  0.4    Alkaline Phos 38 - 126 U/L 67  93    AST 15 - 41 U/L 17  16    ALT 0 - 44 U/L 12  10      RADIOGRAPHIC STUDIES: XR Lumbar Spine 2-3 Views  Result Date: 04/03/2023 X-rays demonstrate multilevel degenerative changes with associated scoliosis   ASSESSMENT & PLAN Nicole Mccormick 77 y.o. female with medical history significant for iron deficiency anemia 2/2 to GI bleed who presents for a follow up visit.   After review the labs, the records, discussion the patient the findings most consistent with an iron deficiency anemia responding to IV iron therapy.  Given the macrocytosis I would recommend that the patient d/c ETOH use while she continues vitamin B12 and folate therapy.  She also reports that she drinks approximately 1 glass of wine per night which may be partially to blame for her  macrocytosis.  We will plan to have her repeat labs with clinic visit in 6 months The patient voiced understanding of this plan  moving forward.  # Iron Deficiency Anemia 2/2 to GI Bleed   -- findings were consistent with persistent iron deficiency anemia despite adequate PO iron therapy --labs today show white blood cell 4.3, hemoglobin 12.5, MCV 90, and platelets of 149 --patient is s/p 510mg  IV feraheme q 7 days x 2 doses on 2/4-2/10/22. Most recently patient received 2 doses of feraheme on 4/25-04/10/2023.  --patient follows with Dr. Dulce Sellar at [facility].  --continue PO ferrous sulfate 325mg  daily. Take with a source of Vitamin C (orange juice best)  --RTC in 12 weeks with interval 6 week labs   # Arthritis Pain --Patient requesting a refill on her Vicodin due to flares of her arthritis pain.  Pain is currently 7 out of 10 in the hands, knees, and back. -- Prescribed tramadol 50 mg every 8 hours as needed.  Recommend she touch base with her primary care provider for continued management of arthritis pain.  #Macrocytosis-resolved.  --given the timing of this macrocytosis (right around when she started drinking 1 glass of wine 3-4 x per week), ETOH could be the cause of this macrocytosis. She has decreased to 2 glasses per week.  --less likely secondary to deficiency with folate or vitamin b12 as she has been supplementing these nutrients --potentially represent robust reticulocytosis, but ETOH seems more likely at this time.   --patient not currently on any medications known to cause macrocytosis.   No orders of the defined types were placed in this encounter.  All questions were answered. The patient knows to call the clinic with any problems, questions or concerns.  A total of more than 30 minutes were spent on this encounter and over half of that time was spent on counseling and coordination of care as outlined above.   Ulysees Barns, MD Department of Hematology/Oncology [facility]  [facility] Phone: [phone] Pager: [phone] Email: [email or web address]  04/29/2023 2:46 PM

## 2023-05-01 ENCOUNTER — Ambulatory Visit (HOSPITAL_BASED_OUTPATIENT_CLINIC_OR_DEPARTMENT_OTHER): Admission: RE | Admit: 2023-05-01 | Payer: 59 | Source: Ambulatory Visit

## 2023-05-21 ENCOUNTER — Ambulatory Visit (INDEPENDENT_AMBULATORY_CARE_PROVIDER_SITE_OTHER): Payer: 59

## 2023-05-21 DIAGNOSIS — I495 Sick sinus syndrome: Secondary | ICD-10-CM

## 2023-05-21 LAB — CUP PACEART REMOTE DEVICE CHECK
Battery Remaining Longevity: 58 mo
Battery Remaining Percentage: 50 %
Battery Voltage: 2.98 V
Brady Statistic AP VP Percent: 1.7 %
Brady Statistic AP VS Percent: 98 %
Brady Statistic AS VP Percent: 1 %
Brady Statistic AS VS Percent: 1 %
Brady Statistic RA Percent Paced: 98 %
Brady Statistic RV Percent Paced: 1.7 %
Date Time Interrogation Session: 20240612191143
Implantable Lead Connection Status: 753985
Implantable Lead Connection Status: 753985
Implantable Lead Implant Date: 20190228
Implantable Lead Implant Date: 20190228
Implantable Lead Location: 753859
Implantable Lead Location: 753860
Implantable Pulse Generator Implant Date: 20190228
Lead Channel Impedance Value: 350 Ohm
Lead Channel Impedance Value: 460 Ohm
Lead Channel Pacing Threshold Amplitude: 0.5 V
Lead Channel Pacing Threshold Amplitude: 0.625 V
Lead Channel Pacing Threshold Pulse Width: 0.5 ms
Lead Channel Pacing Threshold Pulse Width: 0.5 ms
Lead Channel Sensing Intrinsic Amplitude: 10.3 mV
Lead Channel Sensing Intrinsic Amplitude: 4.9 mV
Lead Channel Setting Pacing Amplitude: 0.875
Lead Channel Setting Pacing Amplitude: 1.5 V
Lead Channel Setting Pacing Pulse Width: 0.5 ms
Lead Channel Setting Sensing Sensitivity: 2 mV
Pulse Gen Model: 2272
Pulse Gen Serial Number: 8996997

## 2023-06-10 ENCOUNTER — Inpatient Hospital Stay: Payer: 59 | Attending: Hematology and Oncology

## 2023-06-10 ENCOUNTER — Other Ambulatory Visit: Payer: Self-pay | Admitting: Infectious Diseases

## 2023-06-10 DIAGNOSIS — B20 Human immunodeficiency virus [HIV] disease: Secondary | ICD-10-CM

## 2023-06-17 NOTE — Progress Notes (Signed)
Remote pacemaker transmission.   

## 2023-07-23 ENCOUNTER — Ambulatory Visit: Payer: 59 | Admitting: Hematology and Oncology

## 2023-07-23 ENCOUNTER — Other Ambulatory Visit: Payer: 59

## 2023-07-29 ENCOUNTER — Inpatient Hospital Stay: Payer: 59 | Attending: Hematology and Oncology

## 2023-07-29 ENCOUNTER — Encounter: Payer: Self-pay | Admitting: Hematology and Oncology

## 2023-07-29 ENCOUNTER — Other Ambulatory Visit: Payer: Self-pay | Admitting: Hematology and Oncology

## 2023-07-29 ENCOUNTER — Inpatient Hospital Stay: Payer: 59 | Admitting: Hematology and Oncology

## 2023-07-29 DIAGNOSIS — D500 Iron deficiency anemia secondary to blood loss (chronic): Secondary | ICD-10-CM

## 2023-07-29 NOTE — Progress Notes (Signed)
[facility] [phone]   [phone]  PROGRESS NOTE  Patient Care Team: Raymon Mutton., FNP as PCP - General (Family Medicine) Ninetta Lights Lacretia Leigh, MD as PCP - Infectious Diseases (Infectious Diseases) Mealor, Roberts Gaudy, MD as PCP - Electrophysiology (Cardiology) Willis Modena, MD as Consulting Physician (Gastroenterology)  Hematological/Oncological History # Iron Deficiency Anemia 2/2 to GI Bleed  10/29/2020: patient presented the ED with weakness and was found to have a Hgb of 4.1 11/01/2020: GI evaluation showed numerous bleeding/nonbleeding AVMS. Cauterization performed.  12/18/2020: Iron 32, TIBC 441, Sat 7%, Ferritin 31. Hgb 8.3, Plt 313, WBC 4.2, MCV 102.5.  01/03/2021: establish care with Dr. Leonides Schanz  2/4-2/09/2021: IV feraheme 510mg  x 2 doses. Received 1 unit of PRBC on 01/12/2021  08/01/2021: WBC 4.5, Hgb 12.1, MCV 104.9, Plt 223 02/06/2022: WBC 5.7, Hgb 13.0, MCV 104.8, Plt 196 03/20/2023: Hgb 6.8, WBC 3.1, MCV 75.9, ferritin 3, Iron sat 2%. Patient received 2 units of PRBC.   Interval History:  Nicole Mccormick 77 y.o. female with medical history significant for iron deficiency anemia 2/2 to GI bleed who presents for a follow up visit. The patient's last visit was on  04/29/2023  On exam today Nicole Mccormick notes ***  Her weight has been stable and she otherwise denies any fevers, chills, sweats, nausea, vomiting or diarrhea.  A full 10 point ROS is listed below.  MEDICAL HISTORY:  Past Medical History:  Diagnosis Date   Abnormal Pap smear    Aortic stenosis    TTE 2021 with mean gradient   Arthritis    Asthma    AVM (arteriovenous malformation)    Bradycardia    Complication of anesthesia    "they have a hard time bringing me back" (11/08/2015)   Coronary artery disease    High grade RCA disease; diagnosed during acute GIB; on medical therapy   Dynamic left ventricular outflow obstruction    GERD (gastroesophageal reflux disease)     previously on aciphex, discontinued november 2012  because patient asymptomatic, and concern about interference with HIV meds.  May try pepcid in the future if symptoms return   Heart murmur    Hepatitis C antibody test positive    HIV infection (HCC)    diagnosed before 2008   Hyperlipidemia 09/12/2021   Hypertension    Hypertensive heart disease    Iron deficiency anemia    Ferritin = 2 in november 2012, started on iron supplemenation   Pacemaker    a. [employer] Jude PPM 01/2018.   Seizures (HCC)    Symptomatic anemia 08/24/2020   Varicosities     SURGICAL HISTORY: Past Surgical History:  Procedure Laterality Date   ABDOMINAL HYSTERECTOMY     CARDIAC CATHETERIZATION N/A 11/08/2015   Procedure: Left Heart Cath and Coronary Angiography;  Surgeon: Corky Crafts, MD;  Location: [facility] INVASIVE CV LAB;  Service: Cardiovascular;  Laterality: N/A;   COLONOSCOPY  10/13/11   small adenoma, anal condyloma   COLONOSCOPY  10/13/2011   Procedure: COLONOSCOPY;  Surgeon: Iva Boop, MD;  Location: [facility] ENDOSCOPY;  Service: Endoscopy;  Laterality: N/A;   COLONOSCOPY N/A 09/14/2014   Procedure: COLONOSCOPY;  Surgeon: Willis Modena, MD;  Location: [facility] ENDOSCOPY;  Service: Endoscopy;  Laterality: N/A;   COLONOSCOPY WITH PROPOFOL N/A 06/05/2020   Procedure: COLONOSCOPY WITH PROPOFOL;  Surgeon: Willis Modena, MD;  Location: [facility] ENDOSCOPY;  Service: Endoscopy;  Laterality: N/A;   ENTEROSCOPY N/A 10/31/2020   Procedure: ENTEROSCOPY;  Surgeon:  Kathi Der, MD;  Location: [facility] ENDOSCOPY;  Service: Gastroenterology;  Laterality: N/A;   ESOPHAGOGASTRODUODENOSCOPY  10/13/11   small hiatal hernia   ESOPHAGOGASTRODUODENOSCOPY  10/13/2011   Procedure: ESOPHAGOGASTRODUODENOSCOPY (EGD);  Surgeon: Iva Boop, MD;  Location: [facility] ENDOSCOPY;  Service: Endoscopy;  Laterality: N/A;   ESOPHAGOGASTRODUODENOSCOPY N/A 09/14/2014   Procedure: ESOPHAGOGASTRODUODENOSCOPY (EGD);  Surgeon: Willis Modena, MD;  Location: [facility]  ENDOSCOPY;  Service: Endoscopy;  Laterality: N/A;   ESOPHAGOGASTRODUODENOSCOPY (EGD) WITH PROPOFOL N/A 06/05/2020   Procedure: ESOPHAGOGASTRODUODENOSCOPY (EGD) WITH PROPOFOL;  Surgeon: Willis Modena, MD;  Location: [facility] ENDOSCOPY;  Service: Endoscopy;  Laterality: N/A;   GIVENS CAPSULE STUDY N/A 09/14/2014   Procedure: GIVENS CAPSULE STUDY;  Surgeon: Willis Modena, MD;  Location: [facility] ENDOSCOPY;  Service: Endoscopy;  Laterality: N/A;   GIVENS CAPSULE STUDY N/A 08/26/2020   Procedure: GIVENS CAPSULE STUDY;  Surgeon: Graylin Shiver, MD;  Location: [facility] ENDOSCOPY;  Service: Endoscopy;  Laterality: N/A;   HOT HEMOSTASIS N/A 10/31/2020   Procedure: HOT HEMOSTASIS (ARGON PLASMA COAGULATION/BICAP);  Surgeon: Kathi Der, MD;  Location: [facility] ENDOSCOPY;  Service: Gastroenterology;  Laterality: N/A;   PACEMAKER IMPLANT N/A 02/05/2018   Procedure: PACEMAKER IMPLANT;  Surgeon: Hillis Range, MD;  Location: [facility] INVASIVE CV LAB;  Service: Cardiovascular;  Laterality: N/A;   POLYPECTOMY  06/05/2020   Procedure: POLYPECTOMY;  Surgeon: Willis Modena, MD;  Location: [facility] ENDOSCOPY;  Service: Endoscopy;;    SOCIAL HISTORY: Social History   Socioeconomic History   Marital status: Married    Spouse name: Not on file   Number of children: 6   Years of education: Not on file   Highest education level: Not on file  Occupational History   Occupation: retired  Tobacco Use   Smoking status: Every Day    Current packs/day: 1.00    Average packs/day: 1 pack/day for 60.0 years (60.0 ttl pk-yrs)    Types: Cigarettes   Smokeless tobacco: Never   Tobacco comments:    Sometimes less.  Vaping Use   Vaping status: Never Used  Substance and Sexual Activity   Alcohol use: Yes    Alcohol/week: 10.0 standard drinks of alcohol    Types: 10 Glasses of wine per week    Comment: wine sometimes.   Drug use: No   Sexual activity: Yes    Partners: Male    Birth control/protection: Surgical    Comment: pt. given condoms  08/23/20  Other Topics Concern   Not on file  Social History Narrative   Not on file   Social Determinants of Health   Financial Resource Strain: High Risk (03/26/2023)   Overall Financial Resource Strain (CARDIA)    Difficulty of Paying Living Expenses: Hard  Food Insecurity: No Food Insecurity (03/26/2023)   Hunger Vital Sign    Worried About Running Out of Food in the Last Year: Never true    Ran Out of Food in the Last Year: Never true  Transportation Needs: Unmet Transportation Needs (03/26/2023)   PRAPARE - Transportation    Lack of Transportation (Medical): Yes    Lack of Transportation (Non-Medical): Yes  Physical Activity: Inactive (03/26/2023)   Exercise Vital Sign    Days of Exercise per Week: 1 day    Minutes of Exercise per Session: 0 min  Stress: No Stress Concern Present (03/26/2023)   [employer] of Occupational Health - Occupational Stress Questionnaire    Feeling of Stress : Only a little  Social Connections: Socially Integrated (03/26/2023)   Social Connection and Isolation  Panel [NHANES]    Frequency of Communication with Friends and Family: More than three times a week    Frequency of Social Gatherings with Friends and Family: More than three times a week    Attends Religious Services: More than 4 times per year    Active Member of [employer] or Organizations: Yes    Attends Banker Meetings: 1 to 4 times per year    Marital Status: Married  Catering manager Violence: Not At Risk (03/26/2023)   Humiliation, Afraid, Rape, and Kick questionnaire    Fear of Current or Ex-Partner: No    Emotionally Abused: No    Physically Abused: No    Sexually Abused: No    FAMILY HISTORY: Family History  Problem Relation Age of Onset   Diabetes Mother    Ovarian cancer Sister    Colon cancer Sister     ALLERGIES:  is allergic to penicillins, penicillin g, and avelox [moxifloxacin hcl in nacl].  MEDICATIONS:  Current Outpatient Medications  Medication Sig  Dispense Refill   acyclovir (ZOVIRAX) 400 MG tablet Take 1 tablet (400 mg total) by mouth daily as needed (for flare ups). 30 tablet 3   albuterol (PROAIR HFA) 108 (90 Base) MCG/ACT inhaler INHALE 2 PUFFS BY MOUTH EVERY 4 HOURS IF NEEDED FOR WHEEZING OR SHORTNESS OF BREATH (Patient taking differently: Inhale 2 puffs into the lungs every 4 (four) hours as needed for wheezing or shortness of breath.) 8.5 g 0   amLODipine (NORVASC) 10 MG tablet TAKE 1 TABLET BY MOUTH EVERY DAY 90 tablet 0   Ascorbic Acid (VITAMIN C) 1000 MG tablet Take 1,000 mg by mouth daily.     atorvastatin (LIPITOR) 40 MG tablet take 1 tablet by mouth once daily AT 6 PM (Patient taking differently: Take 40 mg by mouth every evening.) 90 tablet 0   BIKTARVY 50-200-25 MG TABS tablet TAKE 1 TABLET BY MOUTH EVERY DAY (Patient taking differently: Take 1 tablet by mouth daily.) 90 tablet 3   cholecalciferol (VITAMIN D3) 25 MCG (1000 UNIT) tablet Take 1,000 Units by mouth daily.     cyanocobalamin (VITAMIN B12) 1000 MCG tablet TAKE 1 TABLET BY MOUTH EVERY DAY 90 tablet 1   dextromethorphan (DELSYM) 30 MG/5ML liquid Take 30 mg by mouth as needed for cough.      diclofenac (VOLTAREN) 75 MG EC tablet Take 1 tablet (75 mg total) by mouth 2 (two) times daily as needed. Do not take until finished with steroid taper 60 tablet 2   Ensure (ENSURE) Take 237 mLs by mouth 2 (two) times daily between meals. 5688 mL 5   escitalopram (LEXAPRO) 10 MG tablet Take 1 tablet (10 mg total) by mouth daily as needed (anxiety). 30 tablet 3   famotidine (PEPCID) 20 MG tablet Take 20 mg by mouth 2 (two) times daily.     ferrous sulfate 325 (65 FE) MG EC tablet Take 1 tablet (325 mg total) by mouth in the morning and at bedtime. 180 tablet 0   Flaxseed, Linseed, (FLAX SEED OIL) 1000 MG CAPS Take 1,000 mg by mouth daily.     folic acid (FOLVITE) 1 MG tablet TAKE 1 TABLET BY MOUTH EVERY DAY 90 tablet 1   gabapentin (NEURONTIN) 300 MG capsule Take 300 mg by mouth 2  (two) times daily.     hydrochlorothiazide (HYDRODIURIL) 25 MG tablet Take 25 mg by mouth daily.      HYDROcodone-acetaminophen (NORCO/VICODIN) 5-325 MG tablet Take 1 tablet by mouth 2 (  two) times daily as needed.     isosorbide mononitrate (IMDUR) 60 MG 24 hr tablet Take 1 tablet (60 mg total) by mouth daily. 90 tablet 1   lisinopril (ZESTRIL) 40 MG tablet Take 40 mg by mouth daily.     loratadine (CLARITIN) 10 MG tablet Take 10 mg by mouth daily.     meclizine (ANTIVERT) 25 MG tablet Take 25 mg by mouth as needed for dizziness.      methocarbamol (ROBAXIN) 500 MG tablet Take 1 tablet (500 mg total) by mouth 2 (two) times daily as needed. 20 tablet 1   methocarbamol (ROBAXIN-750) 750 MG tablet Take 1 tablet (750 mg total) by mouth 2 (two) times daily as needed for muscle spasms. 20 tablet 1   metoprolol succinate (TOPROL-XL) 50 MG 24 hr tablet TAKE 1 TABLET BY MOUTH AT BEDTIME 90 tablet 0   Omega-3 Fatty Acids (FISH OIL) 1000 MG CPDR Take 1,000 mg by mouth daily.     ondansetron (ZOFRAN-ODT) 4 MG disintegrating tablet 1 tablet on the tongue and allow to dissolve     pantoprazole (PROTONIX) 40 MG tablet Take 1 tablet (40 mg total) by mouth daily. 90 tablet 0   predniSONE (STERAPRED UNI-PAK 21 TAB) 5 MG (21) TBPK tablet Take as directed 21 tablet 0   spironolactone (ALDACTONE) 25 MG tablet TAKE 1 TABLET BY MOUTH EVERY DAY (Patient taking differently: Take 25 mg by mouth daily.) 30 tablet 0   SYMBICORT 160-4.5 MCG/ACT inhaler Inhale 2 puffs into the lungs 2 (two) times daily.     traMADol (ULTRAM) 50 MG tablet Take 1 tablet (50 mg total) by mouth every 6 (six) hours as needed. 60 tablet 0   vitamin A 16109 UNIT capsule Take 10,000 Units by mouth daily.     No current facility-administered medications for this visit.   Facility-Administered Medications Ordered in Other Visits  Medication Dose Route Frequency Provider Last Rate Last Admin   acetaminophen (TYLENOL) tablet 650 mg  650 mg Oral Once  Desma Mcgregor, RPH       diphenhydrAMINE (BENADRYL) capsule 25 mg  25 mg Oral Once Desma Mcgregor, [facility]       ferumoxytol [facility]) 510 mg in sodium chloride 0.9 % 100 mL IVPB  510 mg Intravenous Once Desma Mcgregor, [facility]        REVIEW OF SYSTEMS:   Constitutional: ( - ) fevers, ( - )  chills , ( - ) night sweats Eyes: ( - ) blurriness of vision, ( - ) double vision, ( - ) watery eyes Ears, nose, mouth, throat, and face: ( - ) mucositis, ( - ) sore throat Respiratory: ( - ) cough, ( - ) dyspnea, ( - ) wheezes Cardiovascular: ( - ) palpitation, ( - ) chest discomfort, ( - ) lower extremity swelling Gastrointestinal:  ( - ) nausea, ( - ) heartburn, ( - ) change in bowel habits Skin: ( - ) abnormal skin rashes Lymphatics: ( - ) new lymphadenopathy, ( - ) easy bruising Neurological: ( - ) numbness, ( - ) tingling, ( - ) new weaknesses Behavioral/Psych: ( - ) mood change, ( - ) new changes  All other systems were reviewed with the patient and are negative.  PHYSICAL EXAMINATION:  There were no vitals filed for this visit.   There were no vitals filed for this visit.    GENERAL: well appearing elderly African American female. alert, no distress and comfortable SKIN: skin color, texture, turgor are normal, no  rashes or significant lesions EYES: conjunctiva are pink and non-injected, sclera clear LUNGS: clear to auscultation and percussion with normal breathing effort HEART: regular rate & rhythm and no murmurs and no lower extremity edema Musculoskeletal: no cyanosis of digits and no clubbing  PSYCH: alert & oriented x 3, fluent speech NEURO: no focal motor/sensory deficits  LABORATORY DATA:  I have reviewed the data as listed    Latest Ref Rng & Units 04/29/2023    9:40 AM 04/14/2023    9:12 AM 04/07/2023    9:28 AM  CBC  WBC 4.0 - 10.5 K/uL 4.3  5.7  6.0   Hemoglobin 12.0 - 15.0 g/dL 65.7  84.6  96.2   Hematocrit 36.0 - 46.0 % 38.5  42.3  32.8   Platelets 150 - 400 K/uL 149  209  235         Latest Ref Rng & Units 04/29/2023    9:40 AM 04/02/2023   10:06 AM 03/18/2023   11:00 AM  CMP  Glucose 70 - 99 mg/dL 96  952  841   BUN 8 - 23 mg/dL 28  24  28    Creatinine 0.44 - 1.00 mg/dL 3.24  4.01  0.27   Sodium 135 - 145 mmol/L 139  136  140   Potassium 3.5 - 5.1 mmol/L 4.1  4.3  4.1   Chloride 98 - 111 mmol/L 104  103  102   CO2 22 - 32 mmol/L 31  30  23    Calcium 8.9 - 10.3 mg/dL 9.0  9.3  9.1   Total Protein 6.5 - 8.1 g/dL 7.3  7.8    Total Bilirubin 0.3 - 1.2 mg/dL 0.4  0.4    Alkaline Phos 38 - 126 U/L 67  93    AST 15 - 41 U/L 17  16    ALT 0 - 44 U/L 12  10      RADIOGRAPHIC STUDIES: No results found.  ASSESSMENT & PLAN Nicole Mccormick 77 y.o. female with medical history significant for iron deficiency anemia 2/2 to GI bleed who presents for a follow up visit.   After review the labs, the records, discussion the patient the findings most consistent with an iron deficiency anemia responding to IV iron therapy.  Given the macrocytosis I would recommend that the patient d/c ETOH use while she continues vitamin B12 and folate therapy.  She also reports that she drinks approximately 1 glass of wine per night which may be partially to blame for her macrocytosis.  We will plan to have her repeat labs with clinic visit in 6 months The patient voiced understanding of this plan moving forward.  # Iron Deficiency Anemia 2/2 to GI Bleed   -- findings were consistent with persistent iron deficiency anemia despite adequate PO iron therapy --labs today show white blood cell *** --patient is s/p 510mg  IV feraheme q 7 days x 2 doses on 2/4-2/10/22. Most recently patient received 2 doses of feraheme on 4/25-04/10/2023.  --patient follows with Dr. Dulce Sellar at [facility].  --continue PO ferrous sulfate 325mg  daily. Take with a source of Vitamin C (orange juice best)  --RTC in 12 weeks with interval 6 week labs   # Arthritis Pain --Patient requesting a refill on her Vicodin due to flares of  her arthritis pain.  *** -- Prescribed tramadol 50 mg every 8 hours as needed.  Recommend she touch base with her primary care provider for continued management of arthritis pain.  #Macrocytosis-resolved.  --  given the timing of this macrocytosis (right around when she started drinking 1 glass of wine 3-4 x per week), ETOH could be the cause of this macrocytosis. She has decreased to 2 glasses per week.  --less likely secondary to deficiency with folate or vitamin b12 as she has been supplementing these nutrients --potentially represent robust reticulocytosis, but ETOH seems more likely at this time.   --patient not currently on any medications known to cause macrocytosis.   No orders of the defined types were placed in this encounter.  All questions were answered. The patient knows to call the clinic with any problems, questions or concerns.  A total of more than 30 minutes were spent on this encounter and over half of that time was spent on counseling and coordination of care as outlined above.   Ulysees Barns, MD Department of Hematology/Oncology [facility] Phone: [phone] Pager: [phone] Email: [email or web address]  07/29/2023 8:39 AM

## 2023-08-09 ENCOUNTER — Encounter: Payer: Self-pay | Admitting: Hematology and Oncology

## 2023-08-20 ENCOUNTER — Ambulatory Visit (INDEPENDENT_AMBULATORY_CARE_PROVIDER_SITE_OTHER): Payer: 59

## 2023-08-20 DIAGNOSIS — I495 Sick sinus syndrome: Secondary | ICD-10-CM

## 2023-08-21 ENCOUNTER — Telehealth: Payer: Self-pay | Admitting: *Deleted

## 2023-08-21 ENCOUNTER — Telehealth: Payer: Self-pay | Admitting: Hematology and Oncology

## 2023-08-21 NOTE — Telephone Encounter (Signed)
Received call from pt. She states she is feeling very fatigued and feels SOB. She is thinking her iron is low. Advised that she missed an appt for labs and Dr. Leonides Schanz in August but we will be happy to check labs either tomorrow or Monday, 08/25/23. Then we will schedule follow up with Dr. Leonides Schanz or Georga Kaufmann, PA in 1 week. Pt is agreeable to this plan.  Scheduling message sent

## 2023-08-22 LAB — CUP PACEART REMOTE DEVICE CHECK
Battery Remaining Longevity: 55 mo
Battery Remaining Percentage: 47 %
Battery Voltage: 2.98 V
Brady Statistic AP VP Percent: 1.7 %
Brady Statistic AP VS Percent: 97 %
Brady Statistic AS VP Percent: 1 %
Brady Statistic AS VS Percent: 1 %
Brady Statistic RA Percent Paced: 98 %
Brady Statistic RV Percent Paced: 1.7 %
Date Time Interrogation Session: 20240911020023
Implantable Lead Connection Status: 753985
Implantable Lead Connection Status: 753985
Implantable Lead Implant Date: 20190228
Implantable Lead Implant Date: 20190228
Implantable Lead Location: 753859
Implantable Lead Location: 753860
Implantable Pulse Generator Implant Date: 20190228
Lead Channel Impedance Value: 340 Ohm
Lead Channel Impedance Value: 460 Ohm
Lead Channel Pacing Threshold Amplitude: 0.375 V
Lead Channel Pacing Threshold Amplitude: 0.625 V
Lead Channel Pacing Threshold Pulse Width: 0.5 ms
Lead Channel Pacing Threshold Pulse Width: 0.5 ms
Lead Channel Sensing Intrinsic Amplitude: 4.7 mV
Lead Channel Sensing Intrinsic Amplitude: 9.7 mV
Lead Channel Setting Pacing Amplitude: 0.875
Lead Channel Setting Pacing Amplitude: 1.375
Lead Channel Setting Pacing Pulse Width: 0.5 ms
Lead Channel Setting Sensing Sensitivity: 2 mV
Pulse Gen Model: 2272
Pulse Gen Serial Number: 8996997

## 2023-08-25 ENCOUNTER — Inpatient Hospital Stay: Payer: 59 | Attending: Hematology and Oncology

## 2023-08-25 DIAGNOSIS — M1909 Primary osteoarthritis, other specified site: Secondary | ICD-10-CM | POA: Diagnosis not present

## 2023-08-25 DIAGNOSIS — Z8041 Family history of malignant neoplasm of ovary: Secondary | ICD-10-CM | POA: Diagnosis not present

## 2023-08-25 DIAGNOSIS — F17210 Nicotine dependence, cigarettes, uncomplicated: Secondary | ICD-10-CM | POA: Diagnosis not present

## 2023-08-25 DIAGNOSIS — D500 Iron deficiency anemia secondary to blood loss (chronic): Secondary | ICD-10-CM | POA: Insufficient documentation

## 2023-08-25 LAB — CMP (CANCER CENTER ONLY)
ALT: 9 U/L (ref 0–44)
AST: 16 U/L (ref 15–41)
Albumin: 4.2 g/dL (ref 3.5–5.0)
Alkaline Phosphatase: 75 U/L (ref 38–126)
Anion gap: 5 (ref 5–15)
BUN: 21 mg/dL (ref 8–23)
CO2: 31 mmol/L (ref 22–32)
Calcium: 9.4 mg/dL (ref 8.9–10.3)
Chloride: 102 mmol/L (ref 98–111)
Creatinine: 0.92 mg/dL (ref 0.44–1.00)
GFR, Estimated: >60 mL/min (ref >60)
Glucose, Bld: 106 mg/dL — ABNORMAL HIGH (ref 70–99)
Potassium: 3.8 mmol/L (ref 3.5–5.1)
Sodium: 138 mmol/L (ref 135–145)
Total Bilirubin: 0.4 mg/dL (ref 0.3–1.2)
Total Protein: 7.7 g/dL (ref 6.5–8.1)

## 2023-08-25 LAB — CBC WITH DIFFERENTIAL (CANCER CENTER ONLY)
Abs Immature Granulocytes: 0 10*3/uL (ref 0.00–0.07)
Basophils Absolute: 0 10*3/uL (ref 0.0–0.1)
Basophils Relative: 1 %
Eosinophils Absolute: 0.1 10*3/uL (ref 0.0–0.5)
Eosinophils Relative: 2 %
HCT: 30.5 % — ABNORMAL LOW (ref 36.0–46.0)
Hemoglobin: 9.3 g/dL — ABNORMAL LOW (ref 12.0–15.0)
Immature Granulocytes: 0 %
Lymphocytes Relative: 35 %
Lymphs Abs: 1.3 10*3/uL (ref 0.7–4.0)
MCH: 30.5 pg (ref 26.0–34.0)
MCHC: 30.5 g/dL (ref 30.0–36.0)
MCV: 100 fL (ref 80.0–100.0)
Monocytes Absolute: 0.4 10*3/uL (ref 0.1–1.0)
Monocytes Relative: 11 %
Neutro Abs: 1.9 10*3/uL (ref 1.7–7.7)
Neutrophils Relative %: 51 %
Platelet Count: 236 10*3/uL (ref 150–400)
RBC: 3.05 MIL/uL — ABNORMAL LOW (ref 3.87–5.11)
RDW: 14.6 % (ref 11.5–15.5)
WBC Count: 3.7 10*3/uL — ABNORMAL LOW (ref 4.0–10.5)
nRBC: 0 % (ref 0.0–0.2)

## 2023-08-25 LAB — FERRITIN: Ferritin: 7 ng/mL — ABNORMAL LOW (ref 11–307)

## 2023-08-25 LAB — RETIC PANEL
Immature Retic Fract: 30.2 % — ABNORMAL HIGH (ref 2.3–15.9)
RBC.: 2.98 MIL/uL — ABNORMAL LOW (ref 3.87–5.11)
Retic Count, Absolute: 97.4 10*3/uL (ref 19.0–186.0)
Retic Ct Pct: 3.3 % — ABNORMAL HIGH (ref 0.4–3.1)
Reticulocyte Hemoglobin: 26.2 pg — ABNORMAL LOW (ref >27.9)

## 2023-08-25 LAB — IRON AND IRON BINDING CAPACITY (CC-WL,HP ONLY)
Iron: 25 ug/dL — ABNORMAL LOW (ref 28–170)
Saturation Ratios: 5 % — ABNORMAL LOW (ref 10.4–31.8)
TIBC: 540 ug/dL — ABNORMAL HIGH (ref 250–450)
UIBC: 515 ug/dL — ABNORMAL HIGH (ref 148–442)

## 2023-08-28 ENCOUNTER — Inpatient Hospital Stay (HOSPITAL_BASED_OUTPATIENT_CLINIC_OR_DEPARTMENT_OTHER): Payer: 59 | Admitting: Hematology and Oncology

## 2023-08-28 ENCOUNTER — Other Ambulatory Visit: Payer: Self-pay | Admitting: Hematology and Oncology

## 2023-08-28 VITALS — BP 103/71 | HR 60 | Temp 97.5°F | Resp 13 | Wt 127.3 lb

## 2023-08-28 DIAGNOSIS — D7589 Other specified diseases of blood and blood-forming organs: Secondary | ICD-10-CM | POA: Diagnosis not present

## 2023-08-28 DIAGNOSIS — D500 Iron deficiency anemia secondary to blood loss (chronic): Secondary | ICD-10-CM

## 2023-08-28 NOTE — Progress Notes (Signed)
[facility] [phone]   [phone]  PROGRESS NOTE  Patient Care Team: Raymon Mutton., FNP as PCP - General (Family Medicine) Ninetta Lights Lacretia Leigh, MD as PCP - Infectious Diseases (Infectious Diseases) Mealor, Roberts Gaudy, MD as PCP - Electrophysiology (Cardiology) Willis Modena, MD as Consulting Physician (Gastroenterology)  Hematological/Oncological History # Iron Deficiency Anemia 2/2 to GI Bleed  10/29/2020: patient presented the ED with weakness and was found to have a Hgb of 4.1 11/01/2020: GI evaluation showed numerous bleeding/nonbleeding AVMS. Cauterization performed.  12/18/2020: Iron 32, TIBC 441, Sat 7%, Ferritin 31. Hgb 8.3, Plt 313, WBC 4.2, MCV 102.5.  01/03/2021: establish care with Dr. Leonides Schanz  2/4-2/09/2021: IV feraheme 510mg  x 2 doses. Received 1 unit of PRBC on 01/12/2021  08/01/2021: WBC 4.5, Hgb 12.1, MCV 104.9, Plt 223 02/06/2022: WBC 5.7, Hgb 13.0, MCV 104.8, Plt 196 03/20/2023: Hgb 6.8, WBC 3.1, MCV 75.9, ferritin 3, Iron sat 2%. Patient received 2 units of PRBC.   Interval History:  Nicole Mccormick 77 y.o. female with medical history significant for iron deficiency anemia 2/2 to GI bleed who presents for a follow up visit. The patient's last visit was on 07/29/2023.   On exam today [name] notes reports that she has been her breath for 1 week.  She notes that she has not had any overt signs of bleeding, bruising, or dark stools.  She notes that she is having some dizziness and lightheadedness as well.  She notes that because of this she does tend to hold onto things when walking through her house.  She notes that she has been eating well and has tried to eat about 2 sticks per week.  She is also been having increased ice cravings lately.  She notes that she is able to take iron pills without difficulty and is willing to restart those.  Otherwise she has had no recent illnesses such as runny nose, sore throat, or cough.Marland Kitchen  Her weight has  been stable and she otherwise denies any fevers, chills, sweats, nausea, vomiting or diarrhea.  A full 10 point ROS is listed below.  MEDICAL HISTORY:  Past Medical History:  Diagnosis Date   Abnormal Pap smear    Aortic stenosis    TTE 2021 with mean gradient   Arthritis    Asthma    AVM (arteriovenous malformation)    Bradycardia    Complication of anesthesia    "they have a hard time bringing me back" (11/08/2015)   Coronary artery disease    High grade RCA disease; diagnosed during acute GIB; on medical therapy   Dynamic left ventricular outflow obstruction    GERD (gastroesophageal reflux disease)    previously on aciphex, discontinued november 2012  because patient asymptomatic, and concern about interference with HIV meds.  May try pepcid in the future if symptoms return   Heart murmur    Hepatitis C antibody test positive    HIV infection (HCC)    diagnosed before 2008   Hyperlipidemia 09/12/2021   Hypertension    Hypertensive heart disease    Iron deficiency anemia    Ferritin = 2 in november 2012, started on iron supplemenation   Pacemaker    a. St Jude PPM 01/2018.   Seizures (HCC)    Symptomatic anemia 08/24/2020   Varicosities     SURGICAL HISTORY: Past Surgical History:  Procedure Laterality Date   ABDOMINAL HYSTERECTOMY     CARDIAC CATHETERIZATION N/A 11/08/2015   Procedure: Left  Heart Cath and Coronary Angiography;  Surgeon: Corky Crafts, MD;  Location: [facility] INVASIVE CV LAB;  Service: Cardiovascular;  Laterality: N/A;   COLONOSCOPY  10/13/11   small adenoma, anal condyloma   COLONOSCOPY  10/13/2011   Procedure: COLONOSCOPY;  Surgeon: Iva Boop, MD;  Location: [facility] ENDOSCOPY;  Service: Endoscopy;  Laterality: N/A;   COLONOSCOPY N/A 09/14/2014   Procedure: COLONOSCOPY;  Surgeon: Willis Modena, MD;  Location: [facility] ENDOSCOPY;  Service: Endoscopy;  Laterality: N/A;   COLONOSCOPY WITH PROPOFOL N/A 06/05/2020   Procedure: COLONOSCOPY WITH PROPOFOL;   Surgeon: Willis Modena, MD;  Location: [facility] ENDOSCOPY;  Service: Endoscopy;  Laterality: N/A;   ENTEROSCOPY N/A 10/31/2020   Procedure: ENTEROSCOPY;  Surgeon: Kathi Der, MD;  Location: [facility] ENDOSCOPY;  Service: Gastroenterology;  Laterality: N/A;   ESOPHAGOGASTRODUODENOSCOPY  10/13/11   small hiatal hernia   ESOPHAGOGASTRODUODENOSCOPY  10/13/2011   Procedure: ESOPHAGOGASTRODUODENOSCOPY (EGD);  Surgeon: Iva Boop, MD;  Location[facility] ENDOSCOPY;  Service: Endoscopy;  Laterality: N/A;   ESOPHAGOGASTRODUODENOSCOPY N/A 09/14/2014   Procedure: ESOPHAGOGASTRODUODENOSCOPY (EGD);  Surgeon: Willis Modena, MD;  Location: [facility] ENDOSCOPY;  Service: Endoscopy;  Laterality: N/A;   ESOPHAGOGASTRODUODENOSCOPY (EGD) WITH PROPOFOL N/A 06/05/2020   Procedure: ESOPHAGOGASTRODUODENOSCOPY (EGD) WITH PROPOFOL;  Surgeon: Willis Modena, MD;  Location: [facility] ENDOSCOPY;  Service: Endoscopy;  Laterality: N/A;   GIVENS CAPSULE STUDY N/A 09/14/2014   Procedure: GIVENS CAPSULE STUDY;  Surgeon: Willis Modena, MD;  Location: [facility] ENDOSCOPY;  Service: Endoscopy;  Laterality: N/A;   GIVENS CAPSULE STUDY N/A 08/26/2020   Procedure: GIVENS CAPSULE STUDY;  Surgeon: Graylin Shiver, MD;  Location: [facility] ENDOSCOPY;  Service: Endoscopy;  Laterality: N/A;   HOT HEMOSTASIS N/A 10/31/2020   Procedure: HOT HEMOSTASIS (ARGON PLASMA COAGULATION/BICAP);  Surgeon: Kathi Der, MD;  Location: [facility] ENDOSCOPY;  Service: Gastroenterology;  Laterality: N/A;   PACEMAKER IMPLANT N/A 02/05/2018   Procedure: PACEMAKER IMPLANT;  Surgeon: Hillis Range, MD;  Location: [facility] INVASIVE CV LAB;  Service: Cardiovascular;  Laterality: N/A;   POLYPECTOMY  06/05/2020   Procedure: POLYPECTOMY;  Surgeon: Willis Modena, MD;  Location: [facility] ENDOSCOPY;  Service: Endoscopy;;    SOCIAL HISTORY: Social History   Socioeconomic History   Marital status: Married    Spouse name: Not on file   Number of children: 6   Years of education: Not on file   Highest education  level: Not on file  Occupational History   Occupation: retired  Tobacco Use   Smoking status: Every Day    Current packs/day: 1.00    Average packs/day: 1 pack/day for 60.0 years (60.0 ttl pk-yrs)    Types: Cigarettes   Smokeless tobacco: Never   Tobacco comments:    Sometimes less.  Vaping Use   Vaping status: Never Used  Substance and Sexual Activity   Alcohol use: Yes    Alcohol/week: 10.0 standard drinks of alcohol    Types: 10 Glasses of wine per week    Comment: wine sometimes.   Drug use: No   Sexual activity: Yes    Partners: Male    Birth control/protection: Surgical    Comment: pt. given condoms 08/23/20  Other Topics Concern   Not on file  Social History Narrative   Not on file   Social Determinants of Health   Financial Resource Strain: High Risk (03/26/2023)   Overall Financial Resource Strain (CARDIA)    Difficulty of Paying Living Expenses: Hard  Food Insecurity: No Food Insecurity (03/26/2023)   Hunger Vital Sign    Worried  About Running Out of Food in the Last Year: Never true    Ran Out of Food in the Last Year: Never true  Transportation Needs: Unmet Transportation Needs (03/26/2023)   PRAPARE - Transportation    Lack of Transportation (Medical): Yes    Lack of Transportation (Non-Medical): Yes  Physical Activity: Inactive (03/26/2023)   Exercise Vital Sign    Days of Exercise per Week: 1 day    Minutes of Exercise per Session: 0 min  Stress: No Stress Concern Present (03/26/2023)   [employer] of Occupational Health - Occupational Stress Questionnaire    Feeling of Stress : Only a little  Social Connections: Socially Integrated (03/26/2023)   Social Connection and Isolation Panel [NHANES]    Frequency of Communication with Friends and Family: More than three times a week    Frequency of Social Gatherings with Friends and Family: More than three times a week    Attends Religious Services: More than 4 times per year    Active Member of [employer] or  Organizations: Yes    Attends Banker Meetings: 1 to 4 times per year    Marital Status: Married  Catering manager Violence: Not At Risk (03/26/2023)   Humiliation, Afraid, Rape, and Kick questionnaire    Fear of Current or Ex-Partner: No    Emotionally Abused: No    Physically Abused: No    Sexually Abused: No    FAMILY HISTORY: Family History  Problem Relation Age of Onset   Diabetes Mother    Ovarian cancer Sister    Colon cancer Sister     ALLERGIES:  is allergic to penicillins, penicillin g, and avelox [moxifloxacin hcl in nacl].  MEDICATIONS:  Current Outpatient Medications  Medication Sig Dispense Refill   acyclovir (ZOVIRAX) 400 MG tablet Take 1 tablet (400 mg total) by mouth daily as needed (for flare ups). 30 tablet 3   albuterol (PROAIR HFA) 108 (90 Base) MCG/ACT inhaler INHALE 2 PUFFS BY MOUTH EVERY 4 HOURS IF NEEDED FOR WHEEZING OR SHORTNESS OF BREATH (Patient taking differently: Inhale 2 puffs into the lungs every 4 (four) hours as needed for wheezing or shortness of breath.) 8.5 g 0   amLODipine (NORVASC) 10 MG tablet TAKE 1 TABLET BY MOUTH EVERY DAY 90 tablet 0   Ascorbic Acid (VITAMIN C) 1000 MG tablet Take 1,000 mg by mouth daily.     atorvastatin (LIPITOR) 40 MG tablet take 1 tablet by mouth once daily AT 6 PM (Patient taking differently: Take 40 mg by mouth every evening.) 90 tablet 0   BIKTARVY 50-200-25 MG TABS tablet TAKE 1 TABLET BY MOUTH EVERY DAY (Patient taking differently: Take 1 tablet by mouth daily.) 90 tablet 3   cholecalciferol (VITAMIN D3) 25 MCG (1000 UNIT) tablet Take 1,000 Units by mouth daily.     cyanocobalamin (VITAMIN B12) 1000 MCG tablet TAKE 1 TABLET BY MOUTH EVERY DAY 90 tablet 1   dextromethorphan (DELSYM) 30 MG/5ML liquid Take 30 mg by mouth as needed for cough.      diclofenac (VOLTAREN) 75 MG EC tablet Take 1 tablet (75 mg total) by mouth 2 (two) times daily as needed. Do not take until finished with steroid taper 60 tablet  2   Ensure (ENSURE) Take 237 mLs by mouth 2 (two) times daily between meals. 5688 mL 5   escitalopram (LEXAPRO) 10 MG tablet Take 1 tablet (10 mg total) by mouth daily as needed (anxiety). 30 tablet 3   famotidine (PEPCID) 20 MG  tablet Take 20 mg by mouth 2 (two) times daily.     ferrous sulfate 325 (65 FE) MG EC tablet Take 1 tablet (325 mg total) by mouth in the morning and at bedtime. 180 tablet 0   Flaxseed, Linseed, (FLAX SEED OIL) 1000 MG CAPS Take 1,000 mg by mouth daily.     folic acid (FOLVITE) 1 MG tablet TAKE 1 TABLET BY MOUTH EVERY DAY 90 tablet 1   gabapentin (NEURONTIN) 300 MG capsule Take 300 mg by mouth 2 (two) times daily.     hydrochlorothiazide (HYDRODIURIL) 25 MG tablet Take 25 mg by mouth daily.      HYDROcodone-acetaminophen (NORCO/VICODIN) 5-325 MG tablet Take 1 tablet by mouth 2 (two) times daily as needed.     isosorbide mononitrate (IMDUR) 60 MG 24 hr tablet Take 1 tablet (60 mg total) by mouth daily. 90 tablet 1   lisinopril (ZESTRIL) 40 MG tablet Take 40 mg by mouth daily.     loratadine (CLARITIN) 10 MG tablet Take 10 mg by mouth daily.     meclizine (ANTIVERT) 25 MG tablet Take 25 mg by mouth as needed for dizziness.      methocarbamol (ROBAXIN) 500 MG tablet Take 1 tablet (500 mg total) by mouth 2 (two) times daily as needed. 20 tablet 1   methocarbamol (ROBAXIN-750) 750 MG tablet Take 1 tablet (750 mg total) by mouth 2 (two) times daily as needed for muscle spasms. 20 tablet 1   metoprolol succinate (TOPROL-XL) 50 MG 24 hr tablet TAKE 1 TABLET BY MOUTH AT BEDTIME 90 tablet 0   Omega-3 Fatty Acids (FISH OIL) 1000 MG CPDR Take 1,000 mg by mouth daily.     ondansetron (ZOFRAN-ODT) 4 MG disintegrating tablet 1 tablet on the tongue and allow to dissolve     pantoprazole (PROTONIX) 40 MG tablet Take 1 tablet (40 mg total) by mouth daily. 90 tablet 0   predniSONE (STERAPRED UNI-PAK 21 TAB) 5 MG (21) TBPK tablet Take as directed 21 tablet 0   spironolactone (ALDACTONE) 25  MG tablet TAKE 1 TABLET BY MOUTH EVERY DAY (Patient taking differently: Take 25 mg by mouth daily.) 30 tablet 0   SYMBICORT 160-4.5 MCG/ACT inhaler Inhale 2 puffs into the lungs 2 (two) times daily.     traMADol (ULTRAM) 50 MG tablet Take 1 tablet (50 mg total) by mouth every 6 (six) hours as needed. 60 tablet 0   vitamin A 35573 UNIT capsule Take 10,000 Units by mouth daily.     No current facility-administered medications for this visit.   Facility-Administered Medications Ordered in Other Visits  Medication Dose Route Frequency Provider Last Rate Last Admin   acetaminophen (TYLENOL) tablet 650 mg  650 mg Oral Once Desma Mcgregor, RPH       diphenhydrAMINE (BENADRYL) capsule 25 mg  25 mg Oral Once Desma Mcgregor, [facility]       ferumoxytol [facility]) 510 mg in sodium chloride 0.9 % 100 mL IVPB  510 mg Intravenous Once Desma Mcgregor, [facility]        REVIEW OF SYSTEMS:   Constitutional: ( - ) fevers, ( - )  chills , ( - ) night sweats Eyes: ( - ) blurriness of vision, ( - ) double vision, ( - ) watery eyes Ears, nose, mouth, throat, and face: ( - ) mucositis, ( - ) sore throat Respiratory: ( - ) cough, ( - ) dyspnea, ( - ) wheezes Cardiovascular: ( - ) palpitation, ( - ) chest discomfort, ( - )  lower extremity swelling Gastrointestinal:  ( - ) nausea, ( - ) heartburn, ( - ) change in bowel habits Skin: ( - ) abnormal skin rashes Lymphatics: ( - ) new lymphadenopathy, ( - ) easy bruising Neurological: ( - ) numbness, ( - ) tingling, ( - ) new weaknesses Behavioral/Psych: ( - ) mood change, ( - ) new changes  All other systems were reviewed with the patient and are negative.  PHYSICAL EXAMINATION:  Vitals:   08/28/23 0922  BP: 103/71  Pulse: 60  Resp: 13  Temp: (!) 97.5 F (36.4 C)  SpO2: 98%     Filed Weights   08/28/23 0922  Weight: 127 lb 4.8 oz (57.7 kg)      GENERAL: well appearing elderly African American female. alert, no distress and comfortable SKIN: skin color, texture,  turgor are normal, no rashes or significant lesions EYES: conjunctiva are pink and non-injected, sclera clear LUNGS: clear to auscultation and percussion with normal breathing effort HEART: regular rate & rhythm and no murmurs and no lower extremity edema Musculoskeletal: no cyanosis of digits and no clubbing  PSYCH: alert & oriented x 3, fluent speech NEURO: no focal motor/sensory deficits  LABORATORY DATA:  I have reviewed the data as listed    Latest Ref Rng & Units 08/25/2023    8:58 AM 04/29/2023    9:40 AM 04/14/2023    9:12 AM  CBC  WBC 4.0 - 10.5 K/uL 3.7  4.3  5.7   Hemoglobin 12.0 - 15.0 g/dL 9.3  16.1  09.6   Hematocrit 36.0 - 46.0 % 30.5  38.5  42.3   Platelets 150 - 400 K/uL 236  149  209        Latest Ref Rng & Units 08/25/2023    8:58 AM 04/29/2023    9:40 AM 04/02/2023   10:06 AM  CMP  Glucose 70 - 99 mg/dL 045  96  409   BUN 8 - 23 mg/dL 21  28  24    Creatinine 0.44 - 1.00 mg/dL 8.11  9.14  7.82   Sodium 135 - 145 mmol/L 138  139  136   Potassium 3.5 - 5.1 mmol/L 3.8  4.1  4.3   Chloride 98 - 111 mmol/L 102  104  103   CO2 22 - 32 mmol/L 31  31  30    Calcium 8.9 - 10.3 mg/dL 9.4  9.0  9.3   Total Protein 6.5 - 8.1 g/dL 7.7  7.3  7.8   Total Bilirubin 0.3 - 1.2 mg/dL 0.4  0.4  0.4   Alkaline Phos 38 - 126 U/L 75  67  93   AST 15 - 41 U/L 16  17  16    ALT 0 - 44 U/L 9  12  10      RADIOGRAPHIC STUDIES: CUP PACEART REMOTE DEVICE CHECK  Result Date: 08/22/2023 Scheduled remote reviewed. Normal device function.  Next remote 91 days. LA, CVRS   ASSESSMENT & PLAN Deretha Azad 77 y.o. female with medical history significant for iron deficiency anemia 2/2 to GI bleed who presents for a follow up visit.   After review the labs, the records, discussion the patient the findings most consistent with an iron deficiency anemia responding to IV iron therapy.  Given the macrocytosis I would recommend that the patient d/c ETOH use while she continues vitamin B12 and folate  therapy.  She also reports that she drinks approximately 1 glass of wine per night which may be  partially to blame for her macrocytosis.  We will plan to have her repeat labs with clinic visit in 6 months The patient voiced understanding of this plan moving forward.  # Iron Deficiency Anemia 2/2 to GI Bleed   -- findings were consistent with persistent iron deficiency anemia despite adequate PO iron therapy --labs today show white blood cell 3.7, hemoglobin 9.3, MCV 100, and platelets of 236 --patient is s/p 510mg  IV feraheme q 7 days x 2 doses on 2/4-2/10/22. Most recently patient received 2 doses of feraheme on 4/25-04/10/2023.  --patient follows with Dr. Dulce Sellar at [facility].  --continue PO ferrous sulfate 325mg  daily. Take with a source of Vitamin C (orange juice best)  --Due to her current anemia we will proceed with another 2 doses of IV Feraheme. --RTC in 4 to 6 weeks after her last dose of IV iron.  # Arthritis Pain --Patient requesting a refill on her Vicodin due to flares of her arthritis pain.  Pain is currently 7 out of 10 in the hands, knees, and back. -- Prescribed tramadol 50 mg every 8 hours as needed.  Recommend she touch base with her primary care provider for continued management of arthritis pain.  #Macrocytosis-resolved.  --given the timing of this macrocytosis (right around when she started drinking 1 glass of wine 3-4 x per week), ETOH could be the cause of this macrocytosis. She has decreased to 2 glasses per week.  --less likely secondary to deficiency with folate or vitamin b12 as she has been supplementing these nutrients --potentially represent robust reticulocytosis, but ETOH seems more likely at this time.   --patient not currently on any medications known to cause macrocytosis.   No orders of the defined types were placed in this encounter.  All questions were answered. The patient knows to call the clinic with any problems, questions or concerns.  A total of more  than 30 minutes were spent on this encounter and over half of that time was spent on counseling and coordination of care as outlined above.   Ulysees Barns, MD Department of Hematology/Oncology [facility] Phone: [phone] Pager: [phone] Email: [email or web address]  08/29/2023 4:27 PM

## 2023-08-29 ENCOUNTER — Encounter: Payer: Self-pay | Admitting: Hematology and Oncology

## 2023-09-01 ENCOUNTER — Telehealth: Payer: Self-pay

## 2023-09-01 NOTE — Telephone Encounter (Signed)
Auth Submission: NO AUTH NEEDED Site of care: Site of care: [facility] Payer: UHC medicare plus Medicaid Medication & CPT/J Code(s) submitted: Feraheme (ferumoxytol) [id number] Route of submission (phone, fax, portal): portal Phone # Fax # Auth type: Buy/Bill PB Units/visits requested: 510mg  x 2 doses Reference number: [id number] Approval from: 09/01/23 to 12/09/23   Confirmed on the [facility] portal that Feraheme does not need an authorization.

## 2023-09-02 ENCOUNTER — Telehealth: Payer: Self-pay | Admitting: *Deleted

## 2023-09-02 NOTE — Telephone Encounter (Signed)
Called patient lvm for patient to return call to the clinic, needing to mammogram scheduled. Last order was 02-15-22 patient did not get it scheduled.

## 2023-09-03 ENCOUNTER — Ambulatory Visit (INDEPENDENT_AMBULATORY_CARE_PROVIDER_SITE_OTHER): Payer: 59

## 2023-09-03 VITALS — BP 102/67 | HR 60 | Temp 97.7°F | Resp 20 | Ht 62.0 in | Wt 126.8 lb

## 2023-09-03 DIAGNOSIS — D500 Iron deficiency anemia secondary to blood loss (chronic): Secondary | ICD-10-CM | POA: Diagnosis not present

## 2023-09-03 DIAGNOSIS — D7589 Other specified diseases of blood and blood-forming organs: Secondary | ICD-10-CM

## 2023-09-03 DIAGNOSIS — D62 Acute posthemorrhagic anemia: Secondary | ICD-10-CM

## 2023-09-03 MED ORDER — SODIUM CHLORIDE 0.9 % IV SOLN
510.0000 mg | Freq: Once | INTRAVENOUS | Status: AC
  Administered 2023-09-03: 510 mg via INTRAVENOUS
  Filled 2023-09-03: qty 17

## 2023-09-03 NOTE — Progress Notes (Signed)
Diagnosis: Acute Anemia  Provider:  Chilton Greathouse MD  Procedure: IV Infusion  IV Type: Peripheral, IV Location: R Forearm  Feraheme (Ferumoxytol), Dose: 510 mg  Infusion Start Time: 1017  Infusion Stop Time: 1037  Post Infusion IV Care: Patient declined observation and Peripheral IV Discontinued  Discharge: Condition: Good, Destination: Home . AVS Provided  Performed by:  Nat Math, RN

## 2023-09-08 NOTE — Progress Notes (Signed)
Remote pacemaker transmission.   

## 2023-09-10 ENCOUNTER — Ambulatory Visit: Payer: 59

## 2023-09-10 VITALS — BP 94/61 | HR 63 | Temp 98.5°F | Resp 16 | Ht 62.0 in | Wt 127.4 lb

## 2023-09-10 DIAGNOSIS — D62 Acute posthemorrhagic anemia: Secondary | ICD-10-CM

## 2023-09-10 DIAGNOSIS — D500 Iron deficiency anemia secondary to blood loss (chronic): Secondary | ICD-10-CM | POA: Diagnosis not present

## 2023-09-10 DIAGNOSIS — K922 Gastrointestinal hemorrhage, unspecified: Secondary | ICD-10-CM | POA: Diagnosis not present

## 2023-09-10 MED ORDER — SODIUM CHLORIDE 0.9 % IV SOLN
510.0000 mg | Freq: Once | INTRAVENOUS | Status: AC
  Administered 2023-09-10: 510 mg via INTRAVENOUS
  Filled 2023-09-10: qty 17

## 2023-09-10 NOTE — Progress Notes (Signed)
Diagnosis: Iron Deficiency Anemia  Provider:  Chilton Greathouse MD  Procedure: IV Infusion  IV Type: Peripheral, IV Location: R Antecubital  Feraheme (Ferumoxytol), Dose: 510 mg  Infusion Start Time: 0930  Infusion Stop Time: 0947  Post Infusion IV Care: Peripheral IV Discontinued  Discharge: Condition: Good, Destination: Home . AVS Declined  Performed by:  Rico Ala, LPN

## 2023-09-12 ENCOUNTER — Other Ambulatory Visit: Payer: Self-pay | Admitting: Gastroenterology

## 2023-09-12 DIAGNOSIS — R109 Unspecified abdominal pain: Secondary | ICD-10-CM

## 2023-09-16 ENCOUNTER — Inpatient Hospital Stay: Payer: 59 | Attending: Hematology and Oncology

## 2023-09-16 DIAGNOSIS — D500 Iron deficiency anemia secondary to blood loss (chronic): Secondary | ICD-10-CM | POA: Insufficient documentation

## 2023-09-16 LAB — CBC WITH DIFFERENTIAL (CANCER CENTER ONLY)
Abs Immature Granulocytes: 0.02 10*3/uL (ref 0.00–0.07)
Basophils Absolute: 0 10*3/uL (ref 0.0–0.1)
Basophils Relative: 0 %
Eosinophils Absolute: 0.1 10*3/uL (ref 0.0–0.5)
Eosinophils Relative: 1 %
HCT: 36 % (ref 36.0–46.0)
Hemoglobin: 11.2 g/dL — ABNORMAL LOW (ref 12.0–15.0)
Immature Granulocytes: 0 %
Lymphocytes Relative: 15 %
Lymphs Abs: 0.8 10*3/uL (ref 0.7–4.0)
MCH: 32.5 pg (ref 26.0–34.0)
MCHC: 31.1 g/dL (ref 30.0–36.0)
MCV: 104.3 fL — ABNORMAL HIGH (ref 80.0–100.0)
Monocytes Absolute: 0.4 10*3/uL (ref 0.1–1.0)
Monocytes Relative: 9 %
Neutro Abs: 3.7 10*3/uL (ref 1.7–7.7)
Neutrophils Relative %: 75 %
Platelet Count: 209 10*3/uL (ref 150–400)
RBC: 3.45 MIL/uL — ABNORMAL LOW (ref 3.87–5.11)
RDW: 21.1 % — ABNORMAL HIGH (ref 11.5–15.5)
WBC Count: 4.9 10*3/uL (ref 4.0–10.5)
nRBC: 0 % (ref 0.0–0.2)

## 2023-09-16 LAB — IRON AND IRON BINDING CAPACITY (CC-WL,HP ONLY)
Iron: 43 ug/dL (ref 28–170)
Saturation Ratios: 10 % — ABNORMAL LOW (ref 10.4–31.8)
TIBC: 419 ug/dL (ref 250–450)
UIBC: 376 ug/dL (ref 148–442)

## 2023-09-16 LAB — CMP (CANCER CENTER ONLY)
ALT: 12 U/L (ref 0–44)
AST: 17 U/L (ref 15–41)
Albumin: 4.3 g/dL (ref 3.5–5.0)
Alkaline Phosphatase: 75 U/L (ref 38–126)
Anion gap: 6 (ref 5–15)
BUN: 25 mg/dL — ABNORMAL HIGH (ref 8–23)
CO2: 30 mmol/L (ref 22–32)
Calcium: 9.8 mg/dL (ref 8.9–10.3)
Chloride: 102 mmol/L (ref 98–111)
Creatinine: 0.97 mg/dL (ref 0.44–1.00)
GFR, Estimated: 59 mL/min — ABNORMAL LOW (ref >60)
Glucose, Bld: 104 mg/dL — ABNORMAL HIGH (ref 70–99)
Potassium: 3.9 mmol/L (ref 3.5–5.1)
Sodium: 138 mmol/L (ref 135–145)
Total Bilirubin: 0.6 mg/dL (ref 0.3–1.2)
Total Protein: 7.8 g/dL (ref 6.5–8.1)

## 2023-09-16 LAB — RETIC PANEL
Immature Retic Fract: 24.8 % — ABNORMAL HIGH (ref 2.3–15.9)
RBC.: 3.39 MIL/uL — ABNORMAL LOW (ref 3.87–5.11)
Retic Count, Absolute: 135.9 10*3/uL (ref 19.0–186.0)
Retic Ct Pct: 4 % — ABNORMAL HIGH (ref 0.4–3.1)
Reticulocyte Hemoglobin: 41.6 pg (ref >27.9)

## 2023-09-16 LAB — FERRITIN: Ferritin: 321 ng/mL — ABNORMAL HIGH (ref 11–307)

## 2023-09-29 ENCOUNTER — Inpatient Hospital Stay: Payer: 59

## 2023-09-29 ENCOUNTER — Ambulatory Visit
Admission: RE | Admit: 2023-09-29 | Discharge: 2023-09-29 | Disposition: A | Discharge status: Home / Self Care | Payer: 59 | Source: Ambulatory Visit | Attending: Gastroenterology | Admitting: Gastroenterology

## 2023-09-29 DIAGNOSIS — D500 Iron deficiency anemia secondary to blood loss (chronic): Secondary | ICD-10-CM | POA: Diagnosis not present

## 2023-09-29 DIAGNOSIS — R109 Unspecified abdominal pain: Secondary | ICD-10-CM

## 2023-09-29 LAB — CBC WITH DIFFERENTIAL (CANCER CENTER ONLY)
Abs Immature Granulocytes: 0.01 10*3/uL (ref 0.00–0.07)
Basophils Absolute: 0 10*3/uL (ref 0.0–0.1)
Basophils Relative: 1 %
Eosinophils Absolute: 0.1 10*3/uL (ref 0.0–0.5)
Eosinophils Relative: 2 %
HCT: 34.1 % — ABNORMAL LOW (ref 36.0–46.0)
Hemoglobin: 10.9 g/dL — ABNORMAL LOW (ref 12.0–15.0)
Immature Granulocytes: 0 %
Lymphocytes Relative: 29 %
Lymphs Abs: 1.2 10*3/uL (ref 0.7–4.0)
MCH: 33.5 pg (ref 26.0–34.0)
MCHC: 32 g/dL (ref 30.0–36.0)
MCV: 104.9 fL — ABNORMAL HIGH (ref 80.0–100.0)
Monocytes Absolute: 0.4 10*3/uL (ref 0.1–1.0)
Monocytes Relative: 10 %
Neutro Abs: 2.4 10*3/uL (ref 1.7–7.7)
Neutrophils Relative %: 58 %
Platelet Count: 171 10*3/uL (ref 150–400)
RBC: 3.25 MIL/uL — ABNORMAL LOW (ref 3.87–5.11)
RDW: 21.5 % — ABNORMAL HIGH (ref 11.5–15.5)
WBC Count: 4.1 10*3/uL (ref 4.0–10.5)
nRBC: 0 % (ref 0.0–0.2)

## 2023-10-06 ENCOUNTER — Inpatient Hospital Stay: Payer: 59 | Admitting: Hematology and Oncology

## 2023-10-06 ENCOUNTER — Other Ambulatory Visit: Payer: Self-pay | Admitting: Hematology and Oncology

## 2023-10-06 ENCOUNTER — Telehealth: Payer: Self-pay | Admitting: Hematology and Oncology

## 2023-10-06 DIAGNOSIS — D500 Iron deficiency anemia secondary to blood loss (chronic): Secondary | ICD-10-CM

## 2023-10-06 NOTE — Progress Notes (Signed)
[facility] [phone]   [phone]  PROGRESS NOTE  Patient Care Team: Raymon Mutton., FNP as PCP - General (Family Medicine) Ninetta Lights Lacretia Leigh, MD as PCP - Infectious Diseases (Infectious Diseases) Mealor, Roberts Gaudy, MD as PCP - Electrophysiology (Cardiology) Willis Modena, MD as Consulting Physician (Gastroenterology)  Hematological/Oncological History # Iron Deficiency Anemia 2/2 to GI Bleed  10/29/2020: patient presented the ED with weakness and was found to have a Hgb of 4.1 11/01/2020: GI evaluation showed numerous bleeding/nonbleeding AVMS. Cauterization performed.  12/18/2020: Iron 32, TIBC 441, Sat 7%, Ferritin 31. Hgb 8.3, Plt 313, WBC 4.2, MCV 102.5.  01/03/2021: establish care with Dr. Leonides Schanz  2/4-2/09/2021: IV feraheme 510mg  x 2 doses. Received 1 unit of PRBC on 01/12/2021  08/01/2021: WBC 4.5, Hgb 12.1, MCV 104.9, Plt 223 02/06/2022: WBC 5.7, Hgb 13.0, MCV 104.8, Plt 196 03/20/2023: Hgb 6.8, WBC 3.1, MCV 75.9, ferritin 3, Iron sat 2%. Patient received 2 units of PRBC.   Interval History:  Nicole Mccormick 77 y.o. female with medical history significant for iron deficiency anemia 2/2 to GI bleed who presents for a follow up visit. The patient's last visit was on 08/28/2023.   On exam today Nicole Mccormick notes ***.  Her weight has been stable and she otherwise denies any fevers, chills, sweats, nausea, vomiting or diarrhea.  A full 10 point ROS is listed below.  MEDICAL HISTORY:  Past Medical History:  Diagnosis Date   Abnormal Pap smear    Aortic stenosis    TTE 2021 with mean gradient   Arthritis    Asthma    AVM (arteriovenous malformation)    Bradycardia    Complication of anesthesia    "they have a hard time bringing me back" (11/08/2015)   Coronary artery disease    High grade RCA disease; diagnosed during acute GIB; on medical therapy   Dynamic left ventricular outflow obstruction    GERD (gastroesophageal reflux disease)     previously on aciphex, discontinued november 2012  because patient asymptomatic, and concern about interference with HIV meds.  May try pepcid in the future if symptoms return   Heart murmur    Hepatitis C antibody test positive    HIV infection (HCC)    diagnosed before 2008   Hyperlipidemia 09/12/2021   Hypertension    Hypertensive heart disease    Iron deficiency anemia    Ferritin = 2 in november 2012, started on iron supplemenation   Pacemaker    a. St Jude PPM 01/2018.   Seizures (HCC)    Symptomatic anemia 08/24/2020   Varicosities     SURGICAL HISTORY: Past Surgical History:  Procedure Laterality Date   ABDOMINAL HYSTERECTOMY     CARDIAC CATHETERIZATION N/A 11/08/2015   Procedure: Left Heart Cath and Coronary Angiography;  Surgeon: Corky Crafts, MD;  Location: [facility] INVASIVE CV LAB;  Service: Cardiovascular;  Laterality: N/A;   COLONOSCOPY  10/13/11   small adenoma, anal condyloma   COLONOSCOPY  10/13/2011   Procedure: COLONOSCOPY;  Surgeon: Iva Boop, MD;  Location: [facility] ENDOSCOPY;  Service: Endoscopy;  Laterality: N/A;   COLONOSCOPY N/A 09/14/2014   Procedure: COLONOSCOPY;  Surgeon: Willis Modena, MD;  Location: [facility] ENDOSCOPY;  Service: Endoscopy;  Laterality: N/A;   COLONOSCOPY WITH PROPOFOL N/A 06/05/2020   Procedure: COLONOSCOPY WITH PROPOFOL;  Surgeon: Willis Modena, MD;  Location: [facility] ENDOSCOPY;  Service: Endoscopy;  Laterality: N/A;   ENTEROSCOPY N/A 10/31/2020   Procedure: ENTEROSCOPY;  Surgeon:  Kathi Der, MD;  Location: [facility] ENDOSCOPY;  Service: Gastroenterology;  Laterality: N/A;   ESOPHAGOGASTRODUODENOSCOPY  10/13/11   small hiatal hernia   ESOPHAGOGASTRODUODENOSCOPY  10/13/2011   Procedure: ESOPHAGOGASTRODUODENOSCOPY (EGD);  Surgeon: Iva Boop, MD;  Location: [facility] ENDOSCOPY;  Service: Endoscopy;  Laterality: N/A;   ESOPHAGOGASTRODUODENOSCOPY N/A 09/14/2014   Procedure: ESOPHAGOGASTRODUODENOSCOPY (EGD);  Surgeon: Willis Modena, MD;  Location: [facility]  ENDOSCOPY;  Service: Endoscopy;  Laterality: N/A;   ESOPHAGOGASTRODUODENOSCOPY (EGD) WITH PROPOFOL N/A 06/05/2020   Procedure: ESOPHAGOGASTRODUODENOSCOPY (EGD) WITH PROPOFOL;  Surgeon: Willis Modena, MD;  Location: [facility] ENDOSCOPY;  Service: Endoscopy;  Laterality: N/A;   GIVENS CAPSULE STUDY N/A 09/14/2014   Procedure: GIVENS CAPSULE STUDY;  Surgeon: Willis Modena, MD;  Location[facility] ENDOSCOPY;  Service: Endoscopy;  Laterality: N/A;   GIVENS CAPSULE STUDY N/A 08/26/2020   Procedure: GIVENS CAPSULE STUDY;  Surgeon: Graylin Shiver, MD;  Location: [facility] ENDOSCOPY;  Service: Endoscopy;  Laterality: N/A;   HOT HEMOSTASIS N/A 10/31/2020   Procedure: HOT HEMOSTASIS (ARGON PLASMA COAGULATION/BICAP);  Surgeon: Kathi Der, MD;  Location: [facility] ENDOSCOPY;  Service: Gastroenterology;  Laterality: N/A;   PACEMAKER IMPLANT N/A 02/05/2018   Procedure: PACEMAKER IMPLANT;  Surgeon: Hillis Range, MD;  Location: [facility] INVASIVE CV LAB;  Service: Cardiovascular;  Laterality: N/A;   POLYPECTOMY  06/05/2020   Procedure: POLYPECTOMY;  Surgeon: Willis Modena, MD;  Location: [facility] ENDOSCOPY;  Service: Endoscopy;;    SOCIAL HISTORY: Social History   Socioeconomic History   Marital status: Married    Spouse name: Not on file   Number of children: 6   Years of education: Not on file   Highest education level: Not on file  Occupational History   Occupation: retired  Tobacco Use   Smoking status: Every Day    Current packs/day: 1.00    Average packs/day: 1 pack/day for 60.0 years (60.0 ttl pk-yrs)    Types: Cigarettes   Smokeless tobacco: Never   Tobacco comments:    Sometimes less.  Vaping Use   Vaping status: Never Used  Substance and Sexual Activity   Alcohol use: Yes    Alcohol/week: 10.0 standard drinks of alcohol    Types: 10 Glasses of wine per week    Comment: wine sometimes.   Drug use: No   Sexual activity: Yes    Partners: Male    Birth control/protection: Surgical    Comment: pt. given condoms  08/23/20  Other Topics Concern   Not on file  Social History Narrative   Not on file   Social Determinants of Health   Financial Resource Strain: High Risk (03/26/2023)   Overall Financial Resource Strain (CARDIA)    Difficulty of Paying Living Expenses: Hard  Food Insecurity: No Food Insecurity (03/26/2023)   Hunger Vital Sign    Worried About Running Out of Food in the Last Year: Never true    Ran Out of Food in the Last Year: Never true  Transportation Needs: Unmet Transportation Needs (03/26/2023)   PRAPARE - Transportation    Lack of Transportation (Medical): Yes    Lack of Transportation (Non-Medical): Yes  Physical Activity: Inactive (03/26/2023)   Exercise Vital Sign    Days of Exercise per Week: 1 day    Minutes of Exercise per Session: 0 min  Stress: No Stress Concern Present (03/26/2023)   [employer] of Occupational Health - Occupational Stress Questionnaire    Feeling of Stress : Only a little  Social Connections: Socially Integrated (03/26/2023)   Social Connection and Isolation  Panel [NHANES]    Frequency of Communication with Friends and Family: More than three times a week    Frequency of Social Gatherings with Friends and Family: More than three times a week    Attends Religious Services: More than 4 times per year    Active Member of [employer] or Organizations: Yes    Attends Banker Meetings: 1 to 4 times per year    Marital Status: Married  Catering manager Violence: Not At Risk (03/26/2023)   Humiliation, Afraid, Rape, and Kick questionnaire    Fear of Current or Ex-Partner: No    Emotionally Abused: No    Physically Abused: No    Sexually Abused: No    FAMILY HISTORY: Family History  Problem Relation Age of Onset   Diabetes Mother    Ovarian cancer Sister    Colon cancer Sister     ALLERGIES:  is allergic to penicillins, penicillin g, and avelox [moxifloxacin hcl in nacl].  MEDICATIONS:  Current Outpatient Medications  Medication Sig  Dispense Refill   acyclovir (ZOVIRAX) 400 MG tablet Take 1 tablet (400 mg total) by mouth daily as needed (for flare ups). 30 tablet 3   albuterol (PROAIR HFA) 108 (90 Base) MCG/ACT inhaler INHALE 2 PUFFS BY MOUTH EVERY 4 HOURS IF NEEDED FOR WHEEZING OR SHORTNESS OF BREATH (Patient taking differently: Inhale 2 puffs into the lungs every 4 (four) hours as needed for wheezing or shortness of breath.) 8.5 g 0   amLODipine (NORVASC) 10 MG tablet TAKE 1 TABLET BY MOUTH EVERY DAY 90 tablet 0   Ascorbic Acid (VITAMIN C) 1000 MG tablet Take 1,000 mg by mouth daily.     atorvastatin (LIPITOR) 40 MG tablet take 1 tablet by mouth once daily AT 6 PM (Patient taking differently: Take 40 mg by mouth every evening.) 90 tablet 0   BIKTARVY 50-200-25 MG TABS tablet TAKE 1 TABLET BY MOUTH EVERY DAY (Patient taking differently: Take 1 tablet by mouth daily.) 90 tablet 3   cholecalciferol (VITAMIN D3) 25 MCG (1000 UNIT) tablet Take 1,000 Units by mouth daily.     cyanocobalamin (VITAMIN B12) 1000 MCG tablet TAKE 1 TABLET BY MOUTH EVERY DAY 90 tablet 1   dextromethorphan (DELSYM) 30 MG/5ML liquid Take 30 mg by mouth as needed for cough.      diclofenac (VOLTAREN) 75 MG EC tablet Take 1 tablet (75 mg total) by mouth 2 (two) times daily as needed. Do not take until finished with steroid taper 60 tablet 2   Ensure (ENSURE) Take 237 mLs by mouth 2 (two) times daily between meals. 5688 mL 5   escitalopram (LEXAPRO) 10 MG tablet Take 1 tablet (10 mg total) by mouth daily as needed (anxiety). 30 tablet 3   famotidine (PEPCID) 20 MG tablet Take 20 mg by mouth 2 (two) times daily.     ferrous sulfate 325 (65 FE) MG EC tablet Take 1 tablet (325 mg total) by mouth in the morning and at bedtime. 180 tablet 0   Flaxseed, Linseed, (FLAX SEED OIL) 1000 MG CAPS Take 1,000 mg by mouth daily.     folic acid (FOLVITE) 1 MG tablet TAKE 1 TABLET BY MOUTH EVERY DAY 90 tablet 1   gabapentin (NEURONTIN) 300 MG capsule Take 300 mg by mouth 2  (two) times daily.     hydrochlorothiazide (HYDRODIURIL) 25 MG tablet Take 25 mg by mouth daily.      HYDROcodone-acetaminophen (NORCO/VICODIN) 5-325 MG tablet Take 1 tablet by mouth 2 (  two) times daily as needed.     isosorbide mononitrate (IMDUR) 60 MG 24 hr tablet Take 1 tablet (60 mg total) by mouth daily. 90 tablet 1   lisinopril (ZESTRIL) 40 MG tablet Take 40 mg by mouth daily.     loratadine (CLARITIN) 10 MG tablet Take 10 mg by mouth daily.     meclizine (ANTIVERT) 25 MG tablet Take 25 mg by mouth as needed for dizziness.      methocarbamol (ROBAXIN) 500 MG tablet Take 1 tablet (500 mg total) by mouth 2 (two) times daily as needed. 20 tablet 1   methocarbamol (ROBAXIN-750) 750 MG tablet Take 1 tablet (750 mg total) by mouth 2 (two) times daily as needed for muscle spasms. 20 tablet 1   metoprolol succinate (TOPROL-XL) 50 MG 24 hr tablet TAKE 1 TABLET BY MOUTH AT BEDTIME 90 tablet 0   Omega-3 Fatty Acids (FISH OIL) 1000 MG CPDR Take 1,000 mg by mouth daily.     ondansetron (ZOFRAN-ODT) 4 MG disintegrating tablet 1 tablet on the tongue and allow to dissolve     pantoprazole (PROTONIX) 40 MG tablet Take 1 tablet (40 mg total) by mouth daily. 90 tablet 0   predniSONE (STERAPRED UNI-PAK 21 TAB) 5 MG (21) TBPK tablet Take as directed 21 tablet 0   spironolactone (ALDACTONE) 25 MG tablet TAKE 1 TABLET BY MOUTH EVERY DAY (Patient taking differently: Take 25 mg by mouth daily.) 30 tablet 0   SYMBICORT 160-4.5 MCG/ACT inhaler Inhale 2 puffs into the lungs 2 (two) times daily.     traMADol (ULTRAM) 50 MG tablet Take 1 tablet (50 mg total) by mouth every 6 (six) hours as needed. 60 tablet 0   vitamin A 16109 UNIT capsule Take 10,000 Units by mouth daily.     No current facility-administered medications for this visit.   Facility-Administered Medications Ordered in Other Visits  Medication Dose Route Frequency Provider Last Rate Last Admin   acetaminophen (TYLENOL) tablet 650 mg  650 mg Oral Once  Desma Mcgregor, RPH       diphenhydrAMINE (BENADRYL) capsule 25 mg  25 mg Oral Once Desma Mcgregor, [facility]       ferumoxytol [facility]) 510 mg in sodium chloride 0.9 % 100 mL IVPB  510 mg Intravenous Once Desma Mcgregor, [facility]        REVIEW OF SYSTEMS:   Constitutional: ( - ) fevers, ( - )  chills , ( - ) night sweats Eyes: ( - ) blurriness of vision, ( - ) double vision, ( - ) watery eyes Ears, nose, mouth, throat, and face: ( - ) mucositis, ( - ) sore throat Respiratory: ( - ) cough, ( - ) dyspnea, ( - ) wheezes Cardiovascular: ( - ) palpitation, ( - ) chest discomfort, ( - ) lower extremity swelling Gastrointestinal:  ( - ) nausea, ( - ) heartburn, ( - ) change in bowel habits Skin: ( - ) abnormal skin rashes Lymphatics: ( - ) new lymphadenopathy, ( - ) easy bruising Neurological: ( - ) numbness, ( - ) tingling, ( - ) new weaknesses Behavioral/Psych: ( - ) mood change, ( - ) new changes  All other systems were reviewed with the patient and are negative.  PHYSICAL EXAMINATION:  There were no vitals filed for this visit.    There were no vitals filed for this visit.     GENERAL: well appearing elderly African American female. alert, no distress and comfortable SKIN: skin color, texture, turgor are  normal, no rashes or significant lesions EYES: conjunctiva are pink and non-injected, sclera clear LUNGS: clear to auscultation and percussion with normal breathing effort HEART: regular rate & rhythm and no murmurs and no lower extremity edema Musculoskeletal: no cyanosis of digits and no clubbing  PSYCH: alert & oriented x 3, fluent speech NEURO: no focal motor/sensory deficits  LABORATORY DATA:  I have reviewed the data as listed    Latest Ref Rng & Units 09/29/2023    8:58 AM 09/16/2023    9:06 AM 08/25/2023    8:58 AM  CBC  WBC 4.0 - 10.5 K/uL 4.1  4.9  3.7   Hemoglobin 12.0 - 15.0 g/dL 87.5  64.3  9.3   Hematocrit 36.0 - 46.0 % 34.1  36.0  30.5   Platelets 150 - 400 K/uL 171  209   236        Latest Ref Rng & Units 09/16/2023    9:06 AM 08/25/2023    8:58 AM 04/29/2023    9:40 AM  CMP  Glucose 70 - 99 mg/dL 329  518  96   BUN 8 - 23 mg/dL 25  21  28    Creatinine 0.44 - 1.00 mg/dL 8.41  6.60  6.30   Sodium 135 - 145 mmol/L 138  138  139   Potassium 3.5 - 5.1 mmol/L 3.9  3.8  4.1   Chloride 98 - 111 mmol/L 102  102  104   CO2 22 - 32 mmol/L 30  31  31    Calcium 8.9 - 10.3 mg/dL 9.8  9.4  9.0   Total Protein 6.5 - 8.1 g/dL 7.8  7.7  7.3   Total Bilirubin 0.3 - 1.2 mg/dL 0.6  0.4  0.4   Alkaline Phos 38 - 126 U/L 75  75  67   AST 15 - 41 U/L 17  16  17    ALT 0 - 44 U/L 12  9  12      RADIOGRAPHIC STUDIES: US Abdomen Limited RUQ (LIVER/GB)  Result Date: 09/29/2023 CLINICAL DATA:  Abdominal discomfort EXAM: ULTRASOUND ABDOMEN LIMITED RIGHT UPPER QUADRANT COMPARISON:  MRI abdomen 09/29/2017 FINDINGS: Gallbladder: No gallstones or wall thickening visualized. No sonographic Murphy sign noted by sonographer. Common bile duct: Diameter: 3 mm Liver: Increased echogenicity. No focal lesion. Portal vein is patent on color Doppler imaging with normal direction of blood flow towards the liver. Other: None. IMPRESSION: 1. Increased hepatic parenchymal echogenicity suggestive of steatosis. 2. No cholelithiasis or sonographic evidence for acute cholecystitis. Electronically Signed   By: Annia Belt M.D.   On: 09/29/2023 10:15    ASSESSMENT & PLAN Nicole Mccormick 77 y.o. female with medical history significant for iron deficiency anemia 2/2 to GI bleed who presents for a follow up visit.   After review the labs, the records, discussion the patient the findings most consistent with an iron deficiency anemia responding to IV iron therapy.  Given the macrocytosis I would recommend that the patient d/c ETOH use while she continues vitamin B12 and folate therapy.  She also reports that she drinks approximately 1 glass of wine per night which may be partially to blame for her macrocytosis.  We  will plan to have her repeat labs with clinic visit in 6 months The patient voiced understanding of this plan moving forward.  # Iron Deficiency Anemia 2/2 to GI Bleed   -- findings were consistent with persistent iron deficiency anemia despite adequate PO iron therapy --labs today show white blood  cell *** --patient is s/p 510mg  IV feraheme q 7 days x 2 doses on 2/4-2/10/22. Most recently patient received 2 doses of feraheme on 4/25-04/10/2023.  --patient follows with Dr. Dulce Sellar at [facility].  --continue PO ferrous sulfate 325mg  daily. Take with a source of Vitamin C (orange juice best)  --Due to her current anemia we will proceed with another 2 doses of IV Feraheme. --RTC in 4 to 6 weeks after her last dose of IV iron.  # Arthritis Pain --Patient requesting a refill on her Vicodin due to flares of her arthritis pain.  Pain is currently 7 out of 10 in the hands, knees, and back. -- Prescribed tramadol 50 mg every 8 hours as needed.  Recommend she touch base with her primary care provider for continued management of arthritis pain.  #Macrocytosis-resolved.  --given the timing of this macrocytosis (right around when she started drinking 1 glass of wine 3-4 x per week), ETOH could be the cause of this macrocytosis. She has decreased to 2 glasses per week.  --less likely secondary to deficiency with folate or vitamin b12 as she has been supplementing these nutrients --potentially represent robust reticulocytosis, but ETOH seems more likely at this time.   --patient not currently on any medications known to cause macrocytosis.   No orders of the defined types were placed in this encounter.  All questions were answered. The patient knows to call the clinic with any problems, questions or concerns.  A total of more than 30 minutes were spent on this encounter and over half of that time was spent on counseling and coordination of care as outlined above.   Ulysees Barns, MD Department of  Hematology/Oncology [facility] at [facility] Phone: [phone] Pager: [phone] Email: [email or web address]  10/06/2023 7:17 AM

## 2023-10-12 ENCOUNTER — Encounter: Payer: Self-pay | Admitting: Hematology and Oncology

## 2023-10-12 NOTE — Progress Notes (Unsigned)
[facility] [phone]   [phone]  PROGRESS NOTE  Patient Care Team: Raymon Mutton., FNP as PCP - General (Family Medicine) Ninetta Lights Lacretia Leigh, MD as PCP - Infectious Diseases (Infectious Diseases) Mealor, Roberts Gaudy, MD as PCP - Electrophysiology (Cardiology) Willis Modena, MD as Consulting Physician (Gastroenterology)  Hematological/Oncological History # Iron Deficiency Anemia 2/2 to GI Bleed  10/29/2020: patient presented the ED with weakness and was found to have a Hgb of 4.1 11/01/2020: GI evaluation showed numerous bleeding/nonbleeding AVMS. Cauterization performed.  12/18/2020: Iron 32, TIBC 441, Sat 7%, Ferritin 31. Hgb 8.3, Plt 313, WBC 4.2, MCV 102.5.  01/03/2021: establish care with Dr. Leonides Schanz  2/4-2/09/2021: IV feraheme 510mg  x 2 doses. Received 1 unit of PRBC on 01/12/2021  08/01/2021: WBC 4.5, Hgb 12.1, MCV 104.9, Plt 223 02/06/2022: WBC 5.7, Hgb 13.0, MCV 104.8, Plt 196 03/20/2023: Hgb 6.8, WBC 3.1, MCV 75.9, ferritin 3, Iron sat 2%. Patient received 2 units of PRBC.   Interval History:  Nicole Mccormick 77 y.o. female with medical history significant for iron deficiency anemia 2/2 to GI bleed who presents for a follow up visit. The patient's last visit was on 08/28/2023.   On exam today Nicole Mccormick notes ***.  Her weight has been stable and she otherwise denies any fevers, chills, sweats, nausea, vomiting or diarrhea.  A full 10 point ROS is listed below.  MEDICAL HISTORY:  Past Medical History:  Diagnosis Date   Abnormal Pap smear    Aortic stenosis    TTE 2021 with mean gradient   Arthritis    Asthma    AVM (arteriovenous malformation)    Bradycardia    Complication of anesthesia    "they have a hard time bringing me back" (11/08/2015)   Coronary artery disease    High grade RCA disease; diagnosed during acute GIB; on medical therapy   Dynamic left ventricular outflow obstruction    GERD (gastroesophageal reflux disease)     previously on aciphex, discontinued november 2012  because patient asymptomatic, and concern about interference with HIV meds.  May try pepcid in the future if symptoms return   Heart murmur    Hepatitis C antibody test positive    HIV infection (HCC)    diagnosed before 2008   Hyperlipidemia 09/12/2021   Hypertension    Hypertensive heart disease    Iron deficiency anemia    Ferritin = 2 in november 2012, started on iron supplemenation   Pacemaker    a. St Jude PPM 01/2018.   Seizures (HCC)    Symptomatic anemia 08/24/2020   Varicosities     SURGICAL HISTORY: Past Surgical History:  Procedure Laterality Date   ABDOMINAL HYSTERECTOMY     CARDIAC CATHETERIZATION N/A 11/08/2015   Procedure: Left Heart Cath and Coronary Angiography;  Surgeon: Corky Crafts, MD;  Location: [facility] INVASIVE CV LAB;  Service: Cardiovascular;  Laterality: N/A;   COLONOSCOPY  10/13/11   small adenoma, anal condyloma   COLONOSCOPY  10/13/2011   Procedure: COLONOSCOPY;  Surgeon: Iva Boop, MD;  Location: [facility] ENDOSCOPY;  Service: Endoscopy;  Laterality: N/A;   COLONOSCOPY N/A 09/14/2014   Procedure: COLONOSCOPY;  Surgeon: Willis Modena, MD;  Location: [facility] ENDOSCOPY;  Service: Endoscopy;  Laterality: N/A;   COLONOSCOPY WITH PROPOFOL N/A 06/05/2020   Procedure: COLONOSCOPY WITH PROPOFOL;  Surgeon: Willis Modena, MD;  Location: [facility] ENDOSCOPY;  Service: Endoscopy;  Laterality: N/A;   ENTEROSCOPY N/A 10/31/2020   Procedure: ENTEROSCOPY;  Surgeon:  Kathi Der, MD;  Location: [facility] ENDOSCOPY;  Service: Gastroenterology;  Laterality: N/A;   ESOPHAGOGASTRODUODENOSCOPY  10/13/11   small hiatal hernia   ESOPHAGOGASTRODUODENOSCOPY  10/13/2011   Procedure: ESOPHAGOGASTRODUODENOSCOPY (EGD);  Surgeon: Iva Boop, MD;  Location: [facility] ENDOSCOPY;  Service: Endoscopy;  Laterality: N/A;   ESOPHAGOGASTRODUODENOSCOPY N/A 09/14/2014   Procedure: ESOPHAGOGASTRODUODENOSCOPY (EGD);  Surgeon: Willis Modena, MD;  Location: [facility]  ENDOSCOPY;  Service: Endoscopy;  Laterality: N/A;   ESOPHAGOGASTRODUODENOSCOPY (EGD) WITH PROPOFOL N/A 06/05/2020   Procedure: ESOPHAGOGASTRODUODENOSCOPY (EGD) WITH PROPOFOL;  Surgeon: Willis Modena, MD;  Location: [facility] ENDOSCOPY;  Service: Endoscopy;  Laterality: N/A;   GIVENS CAPSULE STUDY N/A 09/14/2014   Procedure: GIVENS CAPSULE STUDY;  Surgeon: Willis Modena, MD;  Location: [facility] ENDOSCOPY;  Service: Endoscopy;  Laterality: N/A;   GIVENS CAPSULE STUDY N/A 08/26/2020   Procedure: GIVENS CAPSULE STUDY;  Surgeon: Graylin Shiver, MD;  Location: [facility] ENDOSCOPY;  Service: Endoscopy;  Laterality: N/A;   HOT HEMOSTASIS N/A 10/31/2020   Procedure: HOT HEMOSTASIS (ARGON PLASMA COAGULATION/BICAP);  Surgeon: Kathi Der, MD;  Location: [facility] ENDOSCOPY;  Service: Gastroenterology;  Laterality: N/A;   PACEMAKER IMPLANT N/A 02/05/2018   Procedure: PACEMAKER IMPLANT;  Surgeon: Hillis Range, MD;  Location: [facility] INVASIVE CV LAB;  Service: Cardiovascular;  Laterality: N/A;   POLYPECTOMY  06/05/2020   Procedure: POLYPECTOMY;  Surgeon: Willis Modena, MD;  Location: [facility] ENDOSCOPY;  Service: Endoscopy;;    SOCIAL HISTORY: Social History   Socioeconomic History   Marital status: Married    Spouse name: Not on file   Number of children: 6   Years of education: Not on file   Highest education level: Not on file  Occupational History   Occupation: retired  Tobacco Use   Smoking status: Every Day    Current packs/day: 1.00    Average packs/day: 1 pack/day for 60.0 years (60.0 ttl pk-yrs)    Types: Cigarettes   Smokeless tobacco: Never   Tobacco comments:    Sometimes less.  Vaping Use   Vaping status: Never Used  Substance and Sexual Activity   Alcohol use: Yes    Alcohol/week: 10.0 standard drinks of alcohol    Types: 10 Glasses of wine per week    Comment: wine sometimes.   Drug use: No   Sexual activity: Yes    Partners: Male    Birth control/protection: Surgical    Comment: pt. given condoms  08/23/20  Other Topics Concern   Not on file  Social History Narrative   Not on file   Social Determinants of Health   Financial Resource Strain: High Risk (03/26/2023)   Overall Financial Resource Strain (CARDIA)    Difficulty of Paying Living Expenses: Hard  Food Insecurity: No Food Insecurity (03/26/2023)   Hunger Vital Sign    Worried About Running Out of Food in the Last Year: Never true    Ran Out of Food in the Last Year: Never true  Transportation Needs: Unmet Transportation Needs (03/26/2023)   PRAPARE - Transportation    Lack of Transportation (Medical): Yes    Lack of Transportation (Non-Medical): Yes  Physical Activity: Inactive (03/26/2023)   Exercise Vital Sign    Days of Exercise per Week: 1 day    Minutes of Exercise per Session: 0 min  Stress: No Stress Concern Present (03/26/2023)   [employer] of Occupational Health - Occupational Stress Questionnaire    Feeling of Stress : Only a little  Social Connections: Socially Integrated (03/26/2023)   Social Connection and Isolation  Panel [NHANES]    Frequency of Communication with Friends and Family: More than three times a week    Frequency of Social Gatherings with Friends and Family: More than three times a week    Attends Religious Services: More than 4 times per year    Active Member of [employer] or Organizations: Yes    Attends Banker Meetings: 1 to 4 times per year    Marital Status: Married  Catering manager Violence: Not At Risk (03/26/2023)   Humiliation, Afraid, Rape, and Kick questionnaire    Fear of Current or Ex-Partner: No    Emotionally Abused: No    Physically Abused: No    Sexually Abused: No    FAMILY HISTORY: Family History  Problem Relation Age of Onset   Diabetes Mother    Ovarian cancer Sister    Colon cancer Sister     ALLERGIES:  is allergic to penicillins, penicillin g, and avelox [moxifloxacin hcl in nacl].  MEDICATIONS:  Current Outpatient Medications  Medication Sig  Dispense Refill   acyclovir (ZOVIRAX) 400 MG tablet Take 1 tablet (400 mg total) by mouth daily as needed (for flare ups). 30 tablet 3   albuterol (PROAIR HFA) 108 (90 Base) MCG/ACT inhaler INHALE 2 PUFFS BY MOUTH EVERY 4 HOURS IF NEEDED FOR WHEEZING OR SHORTNESS OF BREATH (Patient taking differently: Inhale 2 puffs into the lungs every 4 (four) hours as needed for wheezing or shortness of breath.) 8.5 g 0   amLODipine (NORVASC) 10 MG tablet TAKE 1 TABLET BY MOUTH EVERY DAY 90 tablet 0   Ascorbic Acid (VITAMIN C) 1000 MG tablet Take 1,000 mg by mouth daily.     atorvastatin (LIPITOR) 40 MG tablet take 1 tablet by mouth once daily AT 6 PM (Patient taking differently: Take 40 mg by mouth every evening.) 90 tablet 0   BIKTARVY 50-200-25 MG TABS tablet TAKE 1 TABLET BY MOUTH EVERY DAY (Patient taking differently: Take 1 tablet by mouth daily.) 90 tablet 3   cholecalciferol (VITAMIN D3) 25 MCG (1000 UNIT) tablet Take 1,000 Units by mouth daily.     cyanocobalamin (VITAMIN B12) 1000 MCG tablet TAKE 1 TABLET BY MOUTH EVERY DAY 90 tablet 1   dextromethorphan (DELSYM) 30 MG/5ML liquid Take 30 mg by mouth as needed for cough.      diclofenac (VOLTAREN) 75 MG EC tablet Take 1 tablet (75 mg total) by mouth 2 (two) times daily as needed. Do not take until finished with steroid taper 60 tablet 2   Ensure (ENSURE) Take 237 mLs by mouth 2 (two) times daily between meals. 5688 mL 5   escitalopram (LEXAPRO) 10 MG tablet Take 1 tablet (10 mg total) by mouth daily as needed (anxiety). 30 tablet 3   famotidine (PEPCID) 20 MG tablet Take 20 mg by mouth 2 (two) times daily.     ferrous sulfate 325 (65 FE) MG EC tablet Take 1 tablet (325 mg total) by mouth in the morning and at bedtime. 180 tablet 0   Flaxseed, Linseed, (FLAX SEED OIL) 1000 MG CAPS Take 1,000 mg by mouth daily.     folic acid (FOLVITE) 1 MG tablet TAKE 1 TABLET BY MOUTH EVERY DAY 90 tablet 1   gabapentin (NEURONTIN) 300 MG capsule Take 300 mg by mouth 2  (two) times daily.     hydrochlorothiazide (HYDRODIURIL) 25 MG tablet Take 25 mg by mouth daily.      HYDROcodone-acetaminophen (NORCO/VICODIN) 5-325 MG tablet Take 1 tablet by mouth 2 (  two) times daily as needed.     isosorbide mononitrate (IMDUR) 60 MG 24 hr tablet Take 1 tablet (60 mg total) by mouth daily. 90 tablet 1   lisinopril (ZESTRIL) 40 MG tablet Take 40 mg by mouth daily.     loratadine (CLARITIN) 10 MG tablet Take 10 mg by mouth daily.     meclizine (ANTIVERT) 25 MG tablet Take 25 mg by mouth as needed for dizziness.      methocarbamol (ROBAXIN) 500 MG tablet Take 1 tablet (500 mg total) by mouth 2 (two) times daily as needed. 20 tablet 1   methocarbamol (ROBAXIN-750) 750 MG tablet Take 1 tablet (750 mg total) by mouth 2 (two) times daily as needed for muscle spasms. 20 tablet 1   metoprolol succinate (TOPROL-XL) 50 MG 24 hr tablet TAKE 1 TABLET BY MOUTH AT BEDTIME 90 tablet 0   Omega-3 Fatty Acids (FISH OIL) 1000 MG CPDR Take 1,000 mg by mouth daily.     ondansetron (ZOFRAN-ODT) 4 MG disintegrating tablet 1 tablet on the tongue and allow to dissolve     pantoprazole (PROTONIX) 40 MG tablet Take 1 tablet (40 mg total) by mouth daily. 90 tablet 0   predniSONE (STERAPRED UNI-PAK 21 TAB) 5 MG (21) TBPK tablet Take as directed 21 tablet 0   spironolactone (ALDACTONE) 25 MG tablet TAKE 1 TABLET BY MOUTH EVERY DAY (Patient taking differently: Take 25 mg by mouth daily.) 30 tablet 0   SYMBICORT 160-4.5 MCG/ACT inhaler Inhale 2 puffs into the lungs 2 (two) times daily.     traMADol (ULTRAM) 50 MG tablet Take 1 tablet (50 mg total) by mouth every 6 (six) hours as needed. 60 tablet 0   vitamin A 51761 UNIT capsule Take 10,000 Units by mouth daily.     No current facility-administered medications for this visit.   Facility-Administered Medications Ordered in Other Visits  Medication Dose Route Frequency Provider Last Rate Last Admin   acetaminophen (TYLENOL) tablet 650 mg  650 mg Oral Once  Desma Mcgregor, RPH       diphenhydrAMINE (BENADRYL) capsule 25 mg  25 mg Oral Once Desma Mcgregor, [facility]       ferumoxytol [facility]) 510 mg in sodium chloride 0.9 % 100 mL IVPB  510 mg Intravenous Once Desma Mcgregor, [facility]        REVIEW OF SYSTEMS:   Constitutional: ( - ) fevers, ( - )  chills , ( - ) night sweats Eyes: ( - ) blurriness of vision, ( - ) double vision, ( - ) watery eyes Ears, nose, mouth, throat, and face: ( - ) mucositis, ( - ) sore throat Respiratory: ( - ) cough, ( - ) dyspnea, ( - ) wheezes Cardiovascular: ( - ) palpitation, ( - ) chest discomfort, ( - ) lower extremity swelling Gastrointestinal:  ( - ) nausea, ( - ) heartburn, ( - ) change in bowel habits Skin: ( - ) abnormal skin rashes Lymphatics: ( - ) new lymphadenopathy, ( - ) easy bruising Neurological: ( - ) numbness, ( - ) tingling, ( - ) new weaknesses Behavioral/Psych: ( - ) mood change, ( - ) new changes  All other systems were reviewed with the patient and are negative.  PHYSICAL EXAMINATION:  There were no vitals filed for this visit.    There were no vitals filed for this visit.     GENERAL: well appearing elderly African American female. alert, no distress and comfortable SKIN: skin color, texture, turgor are  normal, no rashes or significant lesions EYES: conjunctiva are pink and non-injected, sclera clear LUNGS: clear to auscultation and percussion with normal breathing effort HEART: regular rate & rhythm and no murmurs and no lower extremity edema Musculoskeletal: no cyanosis of digits and no clubbing  PSYCH: alert & oriented x 3, fluent speech NEURO: no focal motor/sensory deficits  LABORATORY DATA:  I have reviewed the data as listed    Latest Ref Rng & Units 09/29/2023    8:58 AM 09/16/2023    9:06 AM 08/25/2023    8:58 AM  CBC  WBC 4.0 - 10.5 K/uL 4.1  4.9  3.7   Hemoglobin 12.0 - 15.0 g/dL 09.8  11.9  9.3   Hematocrit 36.0 - 46.0 % 34.1  36.0  30.5   Platelets 150 - 400 K/uL 171  209   236        Latest Ref Rng & Units 09/16/2023    9:06 AM 08/25/2023    8:58 AM 04/29/2023    9:40 AM  CMP  Glucose 70 - 99 mg/dL 147  829  96   BUN 8 - 23 mg/dL 25  21  28    Creatinine 0.44 - 1.00 mg/dL 5.62  1.30  8.65   Sodium 135 - 145 mmol/L 138  138  139   Potassium 3.5 - 5.1 mmol/L 3.9  3.8  4.1   Chloride 98 - 111 mmol/L 102  102  104   CO2 22 - 32 mmol/L 30  31  31    Calcium 8.9 - 10.3 mg/dL 9.8  9.4  9.0   Total Protein 6.5 - 8.1 g/dL 7.8  7.7  7.3   Total Bilirubin 0.3 - 1.2 mg/dL 0.6  0.4  0.4   Alkaline Phos 38 - 126 U/L 75  75  67   AST 15 - 41 U/L 17  16  17    ALT 0 - 44 U/L 12  9  12      RADIOGRAPHIC STUDIES: US Abdomen Limited RUQ (LIVER/GB)  Result Date: 09/29/2023 CLINICAL DATA:  Abdominal discomfort EXAM: ULTRASOUND ABDOMEN LIMITED RIGHT UPPER QUADRANT COMPARISON:  MRI abdomen 09/29/2017 FINDINGS: Gallbladder: No gallstones or wall thickening visualized. No sonographic Murphy sign noted by sonographer. Common bile duct: Diameter: 3 mm Liver: Increased echogenicity. No focal lesion. Portal vein is patent on color Doppler imaging with normal direction of blood flow towards the liver. Other: None. IMPRESSION: 1. Increased hepatic parenchymal echogenicity suggestive of steatosis. 2. No cholelithiasis or sonographic evidence for acute cholecystitis. Electronically Signed   By: Annia Belt M.D.   On: 09/29/2023 10:15    ASSESSMENT & PLAN Massa Pe 77 y.o. female with medical history significant for iron deficiency anemia 2/2 to GI bleed who presents for a follow up visit.   After review the labs, the records, discussion the patient the findings most consistent with an iron deficiency anemia responding to IV iron therapy.  Given the macrocytosis I would recommend that the patient d/c ETOH use while she continues vitamin B12 and folate therapy.  She also reports that she drinks approximately 1 glass of wine per night which may be partially to blame for her macrocytosis.  We  will plan to have her repeat labs with clinic visit in 6 months The patient voiced understanding of this plan moving forward.  # Iron Deficiency Anemia 2/2 to GI Bleed   -- findings were consistent with persistent iron deficiency anemia despite adequate PO iron therapy --labs today show white blood  cell *** --patient is s/p 510mg  IV feraheme q 7 days x 2 doses on 2/4-2/10/22. Most recently patient received 2 doses of feraheme on 4/25-04/10/2023.  --patient follows with Dr. Dulce Sellar at [facility].  --continue PO ferrous sulfate 325mg  daily. Take with a source of Vitamin C (orange juice best)  --Due to her current anemia we will proceed with another 2 doses of IV Feraheme. --RTC in 4 to 6 weeks after her last dose of IV iron.  # Arthritis Pain --Patient requesting a refill on her Vicodin due to flares of her arthritis pain.  Pain is currently 7 out of 10 in the hands, knees, and back. -- Prescribed tramadol 50 mg every 8 hours as needed.  Recommend she touch base with her primary care provider for continued management of arthritis pain.  #Macrocytosis-resolved.  --given the timing of this macrocytosis (right around when she started drinking 1 glass of wine 3-4 x per week), ETOH could be the cause of this macrocytosis. She has decreased to 2 glasses per week.  --less likely secondary to deficiency with folate or vitamin b12 as she has been supplementing these nutrients --potentially represent robust reticulocytosis, but ETOH seems more likely at this time.   --patient not currently on any medications known to cause macrocytosis.   No orders of the defined types were placed in this encounter.  All questions were answered. The patient knows to call the clinic with any problems, questions or concerns.  A total of more than 30 minutes were spent on this encounter and over half of that time was spent on counseling and coordination of care as outlined above.   Ulysees Barns, MD Department of  Hematology/Oncology [facility] Phone: [phone] Pager: [phone] Email: [email or web address]  10/12/2023 6:06 PM

## 2023-10-15 ENCOUNTER — Telehealth: Payer: Self-pay | Admitting: *Deleted

## 2023-10-15 ENCOUNTER — Inpatient Hospital Stay: Payer: 59 | Admitting: Hematology and Oncology

## 2023-10-15 ENCOUNTER — Inpatient Hospital Stay: Payer: 59

## 2023-10-15 ENCOUNTER — Telehealth: Payer: Self-pay | Admitting: Hematology and Oncology

## 2023-10-15 DIAGNOSIS — D500 Iron deficiency anemia secondary to blood loss (chronic): Secondary | ICD-10-CM

## 2023-10-15 NOTE — Telephone Encounter (Signed)
TCT patient as she did not show up for her appts today.  Pt states she did not know about her appts.  She states she did get her IV iron and feels better. Advised that we will get her rescheduled. Pt voiced understanding.  Scheduling message sent.

## 2023-10-16 ENCOUNTER — Encounter: Payer: Self-pay | Admitting: Hematology and Oncology

## 2023-10-20 NOTE — Progress Notes (Unsigned)
Patient Care Team: Raymon Mutton., FNP as PCP - General (Family Medicine) Ninetta Lights Lacretia Leigh, MD as PCP - Infectious Diseases (Infectious Diseases) Mealor, Roberts Gaudy, MD as PCP - Electrophysiology (Cardiology) Willis Modena, MD as Consulting Physician (Gastroenterology)   CHIEF COMPLAINT: Follow up IDA secondary to GIB   CURRENT THERAPY: RBC transfusion and IV iron PRN  INTERVAL HISTORY Nicole Mccormick returns for follow up, last seen by Dr. Leonides Schanz 08/28/23 and completed 2 doses of IV feraheme. She continues oral iron  ROS   Past Medical History:  Diagnosis Date   Abnormal Pap smear    Aortic stenosis    TTE 2021 with mean gradient   Arthritis    Asthma    AVM (arteriovenous malformation)    Bradycardia    Complication of anesthesia    "they have a hard time bringing me back" (11/08/2015)   Coronary artery disease    High grade RCA disease; diagnosed during acute GIB; on medical therapy   Dynamic left ventricular outflow obstruction    GERD (gastroesophageal reflux disease)    previously on aciphex, discontinued november 2012  because patient asymptomatic, and concern about interference with HIV meds.  May try pepcid in the future if symptoms return   Heart murmur    Hepatitis C antibody test positive    HIV infection (HCC)    diagnosed before 2008   Hyperlipidemia 09/12/2021   Hypertension    Hypertensive heart disease    Iron deficiency anemia    Ferritin = 2 in november 2012, started on iron supplemenation   Pacemaker    a. [employer] PPM 01/2018.   Seizures (HCC)    Symptomatic anemia 08/24/2020   Varicosities      Past Surgical History:  Procedure Laterality Date   ABDOMINAL HYSTERECTOMY     CARDIAC CATHETERIZATION N/A 11/08/2015   Procedure: Left Heart Cath and Coronary Angiography;  Surgeon: Corky Crafts, MD;  Location: [facility] INVASIVE CV LAB;  Service: Cardiovascular;  Laterality: N/A;   COLONOSCOPY  10/13/11   small adenoma, anal condyloma    COLONOSCOPY  10/13/2011   Procedure: COLONOSCOPY;  Surgeon: Iva Boop, MD;  Location: [facility] ENDOSCOPY;  Service: Endoscopy;  Laterality: N/A;   COLONOSCOPY N/A 09/14/2014   Procedure: COLONOSCOPY;  Surgeon: Willis Modena, MD;  Location: [facility] ENDOSCOPY;  Service: Endoscopy;  Laterality: N/A;   COLONOSCOPY WITH PROPOFOL N/A 06/05/2020   Procedure: COLONOSCOPY WITH PROPOFOL;  Surgeon: Willis Modena, MD;  Location: [facility] ENDOSCOPY;  Service: Endoscopy;  Laterality: N/A;   ENTEROSCOPY N/A 10/31/2020   Procedure: ENTEROSCOPY;  Surgeon: Kathi Der, MD;  Location: [facility] ENDOSCOPY;  Service: Gastroenterology;  Laterality: N/A;   ESOPHAGOGASTRODUODENOSCOPY  10/13/11   small hiatal hernia   ESOPHAGOGASTRODUODENOSCOPY  10/13/2011   Procedure: ESOPHAGOGASTRODUODENOSCOPY (EGD);  Surgeon: Iva Boop, MD;  Location: [facility] ENDOSCOPY;  Service: Endoscopy;  Laterality: N/A;   ESOPHAGOGASTRODUODENOSCOPY N/A 09/14/2014   Procedure: ESOPHAGOGASTRODUODENOSCOPY (EGD);  Surgeon: Willis Modena, MD;  Location: [facility] ENDOSCOPY;  Service: Endoscopy;  Laterality: N/A;   ESOPHAGOGASTRODUODENOSCOPY (EGD) WITH PROPOFOL N/A 06/05/2020   Procedure: ESOPHAGOGASTRODUODENOSCOPY (EGD) WITH PROPOFOL;  Surgeon: Willis Modena, MD;  Location: [facility] ENDOSCOPY;  Service: Endoscopy;  Laterality: N/A;   GIVENS CAPSULE STUDY N/A 09/14/2014   Procedure: GIVENS CAPSULE STUDY;  Surgeon: Willis Modena, MD;  Location: [facility] ENDOSCOPY;  Service: Endoscopy;  Laterality: N/A;   GIVENS CAPSULE STUDY N/A 08/26/2020   Procedure: GIVENS CAPSULE STUDY;  Surgeon: Graylin Shiver, MD;  Location:  [facility] ENDOSCOPY;  Service: Endoscopy;  Laterality: N/A;   HOT HEMOSTASIS N/A 10/31/2020   Procedure: HOT HEMOSTASIS (ARGON PLASMA COAGULATION/BICAP);  Surgeon: Kathi Der, MD;  Location: [facility] ENDOSCOPY;  Service: Gastroenterology;  Laterality: N/A;   PACEMAKER IMPLANT N/A 02/05/2018   Procedure: PACEMAKER IMPLANT;  Surgeon: Hillis Range, MD;  Location: [facility] INVASIVE CV LAB;   Service: Cardiovascular;  Laterality: N/A;   POLYPECTOMY  06/05/2020   Procedure: POLYPECTOMY;  Surgeon: Willis Modena, MD;  Location: [facility] ENDOSCOPY;  Service: Endoscopy;;     Outpatient Encounter Medications as of 10/21/2023  Medication Sig Note   acyclovir (ZOVIRAX) 400 MG tablet Take 1 tablet (400 mg total) by mouth daily as needed (for flare ups).    albuterol (PROAIR HFA) 108 (90 Base) MCG/ACT inhaler INHALE 2 PUFFS BY MOUTH EVERY 4 HOURS IF NEEDED FOR WHEEZING OR SHORTNESS OF BREATH (Patient taking differently: Inhale 2 puffs into the lungs every 4 (four) hours as needed for wheezing or shortness of breath.)    amLODipine (NORVASC) 10 MG tablet TAKE 1 TABLET BY MOUTH EVERY DAY 04/02/2023: Was told by pcp to stop BP meds on 03/31/23   Ascorbic Acid (VITAMIN C) 1000 MG tablet Take 1,000 mg by mouth daily.    atorvastatin (LIPITOR) 40 MG tablet take 1 tablet by mouth once daily AT 6 PM (Patient taking differently: Take 40 mg by mouth every evening.)    BIKTARVY 50-200-25 MG TABS tablet TAKE 1 TABLET BY MOUTH EVERY DAY (Patient taking differently: Take 1 tablet by mouth daily.)    cholecalciferol (VITAMIN D3) 25 MCG (1000 UNIT) tablet Take 1,000 Units by mouth daily.    cyanocobalamin (VITAMIN B12) 1000 MCG tablet TAKE 1 TABLET BY MOUTH EVERY DAY    dextromethorphan (DELSYM) 30 MG/5ML liquid Take 30 mg by mouth as needed for cough.     diclofenac (VOLTAREN) 75 MG EC tablet Take 1 tablet (75 mg total) by mouth 2 (two) times daily as needed. Do not take until finished with steroid taper    Ensure (ENSURE) Take 237 mLs by mouth 2 (two) times daily between meals.    escitalopram (LEXAPRO) 10 MG tablet Take 1 tablet (10 mg total) by mouth daily as needed (anxiety).    famotidine (PEPCID) 20 MG tablet Take 20 mg by mouth 2 (two) times daily.    ferrous sulfate 325 (65 FE) MG EC tablet Take 1 tablet (325 mg total) by mouth in the morning and at bedtime.    Flaxseed, Linseed, (FLAX SEED OIL) 1000 MG  CAPS Take 1,000 mg by mouth daily.    folic acid (FOLVITE) 1 MG tablet TAKE 1 TABLET BY MOUTH EVERY DAY    gabapentin (NEURONTIN) 300 MG capsule Take 300 mg by mouth 2 (two) times daily.    hydrochlorothiazide (HYDRODIURIL) 25 MG tablet Take 25 mg by mouth daily.     HYDROcodone-acetaminophen (NORCO/VICODIN) 5-325 MG tablet Take 1 tablet by mouth 2 (two) times daily as needed.    isosorbide mononitrate (IMDUR) 60 MG 24 hr tablet Take 1 tablet (60 mg total) by mouth daily.    lisinopril (ZESTRIL) 40 MG tablet Take 40 mg by mouth daily. 04/02/2023: Per PCP   loratadine (CLARITIN) 10 MG tablet Take 10 mg by mouth daily.    meclizine (ANTIVERT) 25 MG tablet Take 25 mg by mouth as needed for dizziness.     methocarbamol (ROBAXIN) 500 MG tablet Take 1 tablet (500 mg total) by mouth 2 (two) times daily as needed.  methocarbamol (ROBAXIN-750) 750 MG tablet Take 1 tablet (750 mg total) by mouth 2 (two) times daily as needed for muscle spasms.    metoprolol succinate (TOPROL-XL) 50 MG 24 hr tablet TAKE 1 TABLET BY MOUTH AT BEDTIME    Omega-3 Fatty Acids (FISH OIL) 1000 MG CPDR Take 1,000 mg by mouth daily.    ondansetron (ZOFRAN-ODT) 4 MG disintegrating tablet 1 tablet on the tongue and allow to dissolve    pantoprazole (PROTONIX) 40 MG tablet Take 1 tablet (40 mg total) by mouth daily.    predniSONE (STERAPRED UNI-PAK 21 TAB) 5 MG (21) TBPK tablet Take as directed    spironolactone (ALDACTONE) 25 MG tablet TAKE 1 TABLET BY MOUTH EVERY DAY (Patient taking differently: Take 25 mg by mouth daily.)    SYMBICORT 160-4.5 MCG/ACT inhaler Inhale 2 puffs into the lungs 2 (two) times daily.    traMADol (ULTRAM) 50 MG tablet Take 1 tablet (50 mg total) by mouth every 6 (six) hours as needed.    vitamin A 52841 UNIT capsule Take 10,000 Units by mouth daily.    Facility-Administered Encounter Medications as of 10/21/2023  Medication   acetaminophen (TYLENOL) tablet 650 mg   diphenhydrAMINE (BENADRYL) capsule 25  mg   ferumoxytol (FERAHEME) 510 mg in sodium chloride 0.9 % 100 mL IVPB     There were no vitals filed for this visit. There is no height or weight on file to calculate BMI.   PHYSICAL EXAM GENERAL:alert, no distress and comfortable SKIN: no rash  EYES: sclera clear NECK: without mass LYMPH:  no palpable cervical or supraclavicular lymphadenopathy  LUNGS: clear with normal breathing effort HEART: regular rate & rhythm, no lower extremity edema ABDOMEN: abdomen soft, non-tender and normal bowel sounds NEURO: alert & oriented x 3 with fluent speech, no focal motor/sensory deficits Breast exam:  PAC without erythema    CBC    Component Value Date/Time   WBC 4.1 09/29/2023 0858   WBC 4.9 02/15/2022 1007   RBC 3.25 (L) 09/29/2023 0858   HGB 10.9 (L) 09/29/2023 0858   HGB 7.3 (L) 03/18/2023 1100   HCT 34.1 (L) 09/29/2023 0858   HCT 26.7 (L) 03/18/2023 1100   PLT 171 09/29/2023 0858   PLT 263 03/18/2023 1100   MCV 104.9 (H) 09/29/2023 0858   MCV 77 (L) 03/18/2023 1100   MCH 33.5 09/29/2023 0858   MCHC 32.0 09/29/2023 0858   RDW 21.5 (H) 09/29/2023 0858   RDW 17.9 (H) 03/18/2023 1100   LYMPHSABS 1.2 09/29/2023 0858   LYMPHSABS 1.7 02/04/2018 1023   MONOABS 0.4 09/29/2023 0858   EOSABS 0.1 09/29/2023 0858   EOSABS 0.1 02/04/2018 1023   BASOSABS 0.0 09/29/2023 0858   BASOSABS 0.0 02/04/2018 1023     CMP     Component Value Date/Time   NA 138 09/16/2023 0906   NA 140 03/18/2023 1100   K 3.9 09/16/2023 0906   CL 102 09/16/2023 0906   CO2 30 09/16/2023 0906   GLUCOSE 104 (H) 09/16/2023 0906   BUN 25 (H) 09/16/2023 0906   BUN 28 (H) 03/18/2023 1100   CREATININE 0.97 09/16/2023 0906   CREATININE 0.81 02/15/2022 1007   CALCIUM 9.8 09/16/2023 0906   PROT 7.8 09/16/2023 0906   PROT 7.3 11/13/2022 1527   ALBUMIN 4.3 09/16/2023 0906   ALBUMIN 4.2 11/13/2022 1527   AST 17 09/16/2023 0906   ALT 12 09/16/2023 0906   ALKPHOS 75 09/16/2023 0906   BILITOT 0.6 09/16/2023 0906  GFRNONAA 59 (L) 09/16/2023 0906   GFRNONAA 87 03/08/2014 1558   GFRAA >60 08/26/2020 0307   GFRAA >89 03/08/2014 1558     ASSESSMENT & PLAN:  PLAN:  No orders of the defined types were placed in this encounter.     All questions were answered. The patient knows to call the clinic with any problems, questions or concerns. No barriers to learning were detected. I spent *** counseling the patient face to face. The total time spent in the appointment was *** and more than 50% was on counseling, review of test results, and coordination of care.   Nicole Glad, NP-C @DATE @

## 2023-10-21 ENCOUNTER — Ambulatory Visit (HOSPITAL_BASED_OUTPATIENT_CLINIC_OR_DEPARTMENT_OTHER): Payer: 59

## 2023-10-21 ENCOUNTER — Inpatient Hospital Stay (HOSPITAL_BASED_OUTPATIENT_CLINIC_OR_DEPARTMENT_OTHER): Payer: 59 | Admitting: Nurse Practitioner

## 2023-10-21 ENCOUNTER — Inpatient Hospital Stay: Payer: 59 | Attending: Hematology and Oncology

## 2023-10-21 ENCOUNTER — Encounter: Payer: Self-pay | Admitting: Nurse Practitioner

## 2023-10-21 VITALS — BP 100/72 | HR 76 | Temp 98.7°F | Resp 16 | Ht 62.0 in | Wt 130.4 lb

## 2023-10-21 DIAGNOSIS — D500 Iron deficiency anemia secondary to blood loss (chronic): Secondary | ICD-10-CM

## 2023-10-21 DIAGNOSIS — D7589 Other specified diseases of blood and blood-forming organs: Secondary | ICD-10-CM | POA: Diagnosis not present

## 2023-10-21 DIAGNOSIS — R109 Unspecified abdominal pain: Secondary | ICD-10-CM

## 2023-10-21 DIAGNOSIS — K219 Gastro-esophageal reflux disease without esophagitis: Secondary | ICD-10-CM | POA: Insufficient documentation

## 2023-10-21 DIAGNOSIS — K922 Gastrointestinal hemorrhage, unspecified: Secondary | ICD-10-CM | POA: Diagnosis present

## 2023-10-21 LAB — CMP (CANCER CENTER ONLY)
ALT: 8 U/L (ref 0–44)
AST: 14 U/L — ABNORMAL LOW (ref 15–41)
Albumin: 4.2 g/dL (ref 3.5–5.0)
Alkaline Phosphatase: 64 U/L (ref 38–126)
Anion gap: 4 — ABNORMAL LOW (ref 5–15)
BUN: 19 mg/dL (ref 8–23)
CO2: 32 mmol/L (ref 22–32)
Calcium: 9.5 mg/dL (ref 8.9–10.3)
Chloride: 103 mmol/L (ref 98–111)
Creatinine: 0.95 mg/dL (ref 0.44–1.00)
GFR, Estimated: >60 mL/min (ref >60)
Glucose, Bld: 106 mg/dL — ABNORMAL HIGH (ref 70–99)
Potassium: 4.5 mmol/L (ref 3.5–5.1)
Sodium: 139 mmol/L (ref 135–145)
Total Bilirubin: 0.3 mg/dL (ref <1.2)
Total Protein: 7.6 g/dL (ref 6.5–8.1)

## 2023-10-21 LAB — RETIC PANEL
Immature Retic Fract: 18.7 % — ABNORMAL HIGH (ref 2.3–15.9)
RBC.: 3.47 MIL/uL — ABNORMAL LOW (ref 3.87–5.11)
Retic Count, Absolute: 128.4 10*3/uL (ref 19.0–186.0)
Retic Ct Pct: 3.7 % — ABNORMAL HIGH (ref 0.4–3.1)
Reticulocyte Hemoglobin: 35.2 pg (ref >27.9)

## 2023-10-21 LAB — IRON AND IRON BINDING CAPACITY (CC-WL,HP ONLY)
Iron: 242 ug/dL — ABNORMAL HIGH (ref 28–170)
Saturation Ratios: 55 % — ABNORMAL HIGH (ref 10.4–31.8)
TIBC: 440 ug/dL (ref 250–450)
UIBC: 198 ug/dL (ref 148–442)

## 2023-10-21 LAB — CBC WITH DIFFERENTIAL (CANCER CENTER ONLY)
Abs Immature Granulocytes: 0.01 10*3/uL (ref 0.00–0.07)
Basophils Absolute: 0 10*3/uL (ref 0.0–0.1)
Basophils Relative: 1 %
Eosinophils Absolute: 0.1 10*3/uL (ref 0.0–0.5)
Eosinophils Relative: 2 %
HCT: 36.3 % (ref 36.0–46.0)
Hemoglobin: 12 g/dL (ref 12.0–15.0)
Immature Granulocytes: 0 %
Lymphocytes Relative: 32 %
Lymphs Abs: 1.2 10*3/uL (ref 0.7–4.0)
MCH: 35.6 pg — ABNORMAL HIGH (ref 26.0–34.0)
MCHC: 33.1 g/dL (ref 30.0–36.0)
MCV: 107.7 fL — ABNORMAL HIGH (ref 80.0–100.0)
Monocytes Absolute: 0.3 10*3/uL (ref 0.1–1.0)
Monocytes Relative: 9 %
Neutro Abs: 2 10*3/uL (ref 1.7–7.7)
Neutrophils Relative %: 56 %
Platelet Count: 187 10*3/uL (ref 150–400)
RBC: 3.37 MIL/uL — ABNORMAL LOW (ref 3.87–5.11)
RDW: 19.2 % — ABNORMAL HIGH (ref 11.5–15.5)
WBC Count: 3.7 10*3/uL — ABNORMAL LOW (ref 4.0–10.5)
nRBC: 0 % (ref 0.0–0.2)

## 2023-10-21 LAB — FERRITIN: Ferritin: 21 ng/mL (ref 11–307)

## 2023-10-23 ENCOUNTER — Ambulatory Visit (HOSPITAL_BASED_OUTPATIENT_CLINIC_OR_DEPARTMENT_OTHER): Admission: RE | Admit: 2023-10-23 | Payer: 59 | Source: Ambulatory Visit

## 2023-10-27 ENCOUNTER — Telehealth: Payer: Self-pay

## 2023-10-27 NOTE — Telephone Encounter (Signed)
pt cancelled 11/12 and 11/14 CT scans. I called and LM for her to call me back to see if she is going to reschedule. I also spoke with her daughter Nicole Mccormick and she will contact her mother to have her call to reschedule

## 2023-10-30 ENCOUNTER — Encounter: Payer: Self-pay | Admitting: Hematology and Oncology

## 2023-10-30 ENCOUNTER — Encounter: Payer: Self-pay | Admitting: Nurse Practitioner

## 2023-10-30 ENCOUNTER — Other Ambulatory Visit: Payer: Self-pay | Admitting: Nurse Practitioner

## 2023-10-30 ENCOUNTER — Telehealth: Payer: Self-pay

## 2023-10-30 NOTE — Telephone Encounter (Signed)
Nicole Mccormick, please refer to Denial reason and send new tx plan for Venofer or Feraheme.  Auth Submission: DENIED Site of care: Site of care: [facility] Payer: [employer] Medication & CPT/J Code(s) submitted: Monoferric (Ferrci derisomaltose) [phone] Route of submission (phone, fax, portal): portal  Authorization has been DENIED because she must try for 3 weeks and fail Venofer, ferrlecit and Infed. Feraheme has been approved and Venofer does not need an authorization. Please send new orders for which one you would like the patient to get.

## 2023-10-30 NOTE — Telephone Encounter (Addendum)
Called pt. per Np with message below. Pt verbalized understanding. Message was forward to scheduling to get a f/u appt.----- Message from Pollyann Samples sent at 10/30/2023  1:20 PM EST ----- Please let patient know recent labs show iron deficiency and that she is likely actively bleeding. Dr. Leonides Schanz recommends IV iron at [facility] with monoferric x1 in the next week (orders in), then lab and f/up with him 4-6 weeks after IV iron. Please help schedule.  Thanks, Clayborn Heron

## 2023-11-02 ENCOUNTER — Other Ambulatory Visit: Payer: Self-pay | Admitting: Nurse Practitioner

## 2023-11-13 ENCOUNTER — Other Ambulatory Visit: Payer: Self-pay

## 2023-11-13 ENCOUNTER — Telehealth: Payer: Self-pay

## 2023-11-13 NOTE — Telephone Encounter (Signed)
Nicole Mccormick, patient will be scheduled as soon as possible.  Auth Submission: NO AUTH NEEDED Site of care: Site of care: [facility] Payer: [employer] dual complete Medication & CPT/J Code(s) submitted: Feraheme (ferumoxytol) [id number] Route of submission (phone, fax, portal):  Phone # Fax # Auth type: Buy/Bill PB Units/visits requested: 510mg  x 2 doses Reference number:  Approval from: 11/13/23 to 12/09/23

## 2023-11-19 ENCOUNTER — Ambulatory Visit (INDEPENDENT_AMBULATORY_CARE_PROVIDER_SITE_OTHER): Payer: 59

## 2023-11-19 DIAGNOSIS — I495 Sick sinus syndrome: Secondary | ICD-10-CM

## 2023-11-24 LAB — CUP PACEART REMOTE DEVICE CHECK
Battery Remaining Longevity: 52 mo
Battery Remaining Percentage: 45 %
Battery Voltage: 2.98 V
Brady Statistic AP VP Percent: 2 %
Brady Statistic AP VS Percent: 97 %
Brady Statistic AS VP Percent: 1 %
Brady Statistic AS VS Percent: 1 %
Brady Statistic RA Percent Paced: 98 %
Brady Statistic RV Percent Paced: 2 %
Date Time Interrogation Session: 20241213113808
Implantable Lead Connection Status: 753985
Implantable Lead Connection Status: 753985
Implantable Lead Implant Date: 20190228
Implantable Lead Implant Date: 20190228
Implantable Lead Location: 753859
Implantable Lead Location: 753860
Implantable Pulse Generator Implant Date: 20190228
Lead Channel Impedance Value: 350 Ohm
Lead Channel Impedance Value: 480 Ohm
Lead Channel Pacing Threshold Amplitude: 0.375 V
Lead Channel Pacing Threshold Amplitude: 0.625 V
Lead Channel Pacing Threshold Pulse Width: 0.5 ms
Lead Channel Pacing Threshold Pulse Width: 0.5 ms
Lead Channel Sensing Intrinsic Amplitude: 4.5 mV
Lead Channel Sensing Intrinsic Amplitude: 9 mV
Lead Channel Setting Pacing Amplitude: 0.875
Lead Channel Setting Pacing Amplitude: 1.375
Lead Channel Setting Pacing Pulse Width: 0.5 ms
Lead Channel Setting Sensing Sensitivity: 2 mV
Pulse Gen Model: 2272
Pulse Gen Serial Number: 8996997

## 2023-12-09 MED ORDER — SODIUM CHLORIDE 0.9 % IV SOLN
510.0000 mg | Freq: Once | INTRAVENOUS | Status: DC
  Filled 2023-12-09: qty 17

## 2023-12-09 MED ORDER — DIPHENHYDRAMINE HCL 25 MG PO CAPS: 25.0000 mg | ORAL_CAPSULE | Freq: Once | ORAL | Status: DC

## 2023-12-09 MED ORDER — ACETAMINOPHEN 325 MG PO TABS
650.0000 mg | ORAL_TABLET | Freq: Once | ORAL | Status: DC
Start: 2023-12-09 — End: 2023-12-09

## 2023-12-16 ENCOUNTER — Ambulatory Visit: Payer: 59

## 2023-12-16 VITALS — BP 155/73 | HR 60 | Temp 97.8°F | Resp 20 | Ht 62.0 in | Wt 130.6 lb

## 2023-12-16 DIAGNOSIS — D500 Iron deficiency anemia secondary to blood loss (chronic): Secondary | ICD-10-CM | POA: Diagnosis not present

## 2023-12-16 DIAGNOSIS — D62 Acute posthemorrhagic anemia: Secondary | ICD-10-CM

## 2023-12-16 DIAGNOSIS — K922 Gastrointestinal hemorrhage, unspecified: Secondary | ICD-10-CM | POA: Diagnosis not present

## 2023-12-16 MED ORDER — DIPHENHYDRAMINE HCL 25 MG PO CAPS
25.0000 mg | ORAL_CAPSULE | Freq: Once | ORAL | Status: AC
  Administered 2023-12-16: 25 mg via ORAL
  Filled 2023-12-16: qty 1

## 2023-12-16 MED ORDER — ACETAMINOPHEN 325 MG PO TABS
650.0000 mg | ORAL_TABLET | Freq: Once | ORAL | Status: AC
  Administered 2023-12-16: 650 mg via ORAL
  Filled 2023-12-16: qty 2

## 2023-12-16 MED ORDER — SODIUM CHLORIDE 0.9 % IV SOLN
510.0000 mg | Freq: Once | INTRAVENOUS | Status: AC
  Administered 2023-12-16: 510 mg via INTRAVENOUS
  Filled 2023-12-16: qty 17

## 2023-12-16 NOTE — Progress Notes (Signed)
 Diagnosis: Iron Deficiency Anemia  Provider:  Praveen Mannam MD  Procedure: IV Infusion  IV Type: Peripheral, IV Location: L Forearm  Feraheme  (Ferumoxytol ), Dose: 510 mg  Infusion Start Time: 1141  Infusion Stop Time: 1156  Post Infusion IV Care: Patient declined observation and Peripheral IV Discontinued  Discharge: Condition: Good, Destination: Home . AVS Declined  Performed by:  Leita FORBES Miles, LPN

## 2023-12-23 ENCOUNTER — Ambulatory Visit: Payer: 59

## 2023-12-23 VITALS — BP 164/63 | HR 58 | Temp 97.7°F | Resp 16 | Ht 60.0 in | Wt 132.4 lb

## 2023-12-23 DIAGNOSIS — K922 Gastrointestinal hemorrhage, unspecified: Secondary | ICD-10-CM

## 2023-12-23 DIAGNOSIS — D500 Iron deficiency anemia secondary to blood loss (chronic): Secondary | ICD-10-CM

## 2023-12-23 DIAGNOSIS — D62 Acute posthemorrhagic anemia: Secondary | ICD-10-CM

## 2023-12-23 MED ORDER — ACETAMINOPHEN 325 MG PO TABS
650.0000 mg | ORAL_TABLET | Freq: Once | ORAL | Status: AC
Start: 2023-12-23 — End: 2023-12-23
  Administered 2023-12-23: 650 mg via ORAL
  Filled 2023-12-23: qty 2

## 2023-12-23 MED ORDER — DIPHENHYDRAMINE HCL 25 MG PO CAPS
25.0000 mg | ORAL_CAPSULE | Freq: Once | ORAL | Status: AC
Start: 2023-12-23 — End: 2023-12-23
  Administered 2023-12-23: 25 mg via ORAL
  Filled 2023-12-23: qty 1

## 2023-12-23 MED ORDER — SODIUM CHLORIDE 0.9 % IV SOLN
510.0000 mg | Freq: Once | INTRAVENOUS | Status: AC
  Administered 2023-12-23: 510 mg via INTRAVENOUS
  Filled 2023-12-23: qty 17

## 2023-12-23 NOTE — Progress Notes (Signed)
 Diagnosis: Iron Deficiency Anemia  Provider:  Praveen Mannam MD  Procedure: IV Infusion  IV Type: Peripheral, IV Location: L Forearm  Feraheme  (Ferumoxytol ), Dose: 510 mg  Infusion Start Time: 0942  Infusion Stop Time: 0959  Post Infusion IV Care: Patient declined observation and Peripheral IV Discontinued  Discharge: Condition: Good, Destination: Home . AVS Provided  Performed by:  Katey Barrie, RN

## 2023-12-23 NOTE — Patient Instructions (Signed)
 Ferumoxytol Injection What is this medication? FERUMOXYTOL (FER ue MOX i tol) treats low levels of iron in your body (iron deficiency anemia). Iron is a mineral that plays an important role in making red blood cells, which carry oxygen from your lungs to the rest of your body. This medicine may be used for other purposes; ask your health care provider or pharmacist if you have questions. COMMON BRAND NAME(S): Feraheme What should I tell my care team before I take this medication? They need to know if you have any of these conditions: Anemia not caused by low iron levels High levels of iron in the blood Magnetic resonance imaging (MRI) test scheduled An unusual or allergic reaction to iron, other medications, foods, dyes, or preservatives Pregnant or trying to get pregnant Breastfeeding How should I use this medication? This medication is injected into a vein. It is given by your care team in a hospital or clinic setting. Talk to your care team the use of this medication in children. Special care may be needed. Overdosage: If you think you have taken too much of this medicine contact a poison control center or emergency room at once. NOTE: This medicine is only for you. Do not share this medicine with others. What if I miss a dose? It is important not to miss your dose. Call your care team if you are unable to keep an appointment. What may interact with this medication? Other iron products This list may not describe all possible interactions. Give your health care provider a list of all the medicines, herbs, non-prescription drugs, or dietary supplements you use. Also tell them if you smoke, drink alcohol, or use illegal drugs. Some items may interact with your medicine. What should I watch for while using this medication? Visit your care team regularly. Tell your care team if your symptoms do not start to get better or if they get worse. You may need blood work done while you are taking this  medication. You may need to follow a special diet. Talk to your care team. Foods that contain iron include: whole grains/cereals, dried fruits, beans, or peas, leafy green vegetables, and organ meats (liver, kidney). What side effects may I notice from receiving this medication? Side effects that you should report to your care team as soon as possible: Allergic reactions--skin rash, itching, hives, swelling of the face, lips, tongue, or throat Low blood pressure--dizziness, feeling faint or lightheaded, blurry vision Shortness of breath Side effects that usually do not require medical attention (report to your care team if they continue or are bothersome): Flushing Headache Joint pain Muscle pain Nausea Pain, redness, or irritation at injection site This list may not describe all possible side effects. Call your doctor for medical advice about side effects. You may report side effects to FDA at 1-800-FDA-1088. Where should I keep my medication? This medication is given in a hospital or clinic. It will not be stored at home. NOTE: This sheet is a summary. It may not cover all possible information. If you have questions about this medicine, talk to your doctor, pharmacist, or health care provider.  2024 Elsevier/Gold Standard (2023-05-02 00:00:00)

## 2023-12-26 NOTE — Addendum Note (Signed)
Addended by: Elease Etienne A on: 12/26/2023 01:31 PM   Modules accepted: Orders

## 2023-12-26 NOTE — Progress Notes (Signed)
Remote pacemaker transmission.   

## 2024-01-05 ENCOUNTER — Encounter: Payer: Self-pay | Admitting: Hematology and Oncology

## 2024-01-10 ENCOUNTER — Encounter: Payer: Self-pay | Admitting: Hematology and Oncology

## 2024-01-12 ENCOUNTER — Other Ambulatory Visit: Payer: Self-pay | Admitting: Hematology and Oncology

## 2024-01-12 ENCOUNTER — Inpatient Hospital Stay: Payer: 59 | Attending: Hematology and Oncology

## 2024-01-12 ENCOUNTER — Inpatient Hospital Stay: Payer: 59 | Admitting: Hematology and Oncology

## 2024-01-12 ENCOUNTER — Other Ambulatory Visit: Payer: 59

## 2024-01-12 DIAGNOSIS — D500 Iron deficiency anemia secondary to blood loss (chronic): Secondary | ICD-10-CM | POA: Insufficient documentation

## 2024-01-12 DIAGNOSIS — K922 Gastrointestinal hemorrhage, unspecified: Secondary | ICD-10-CM | POA: Insufficient documentation

## 2024-01-12 NOTE — Progress Notes (Unsigned)
[facility] [phone]   [phone]  PROGRESS NOTE  Patient Care Team: Raymon Mutton., FNP as PCP - General (Family Medicine) Ninetta Lights Lacretia Leigh, MD as PCP - Infectious Diseases (Infectious Diseases) Mealor, Roberts Gaudy, MD as PCP - Electrophysiology (Cardiology) Willis Modena, MD as Consulting Physician (Gastroenterology)  Hematological/Oncological History # Iron Deficiency Anemia 2/2 to GI Bleed  10/29/2020: patient presented the ED with weakness and was found to have a Hgb of 4.1 11/01/2020: GI evaluation showed numerous bleeding/nonbleeding AVMS. Cauterization performed.  12/18/2020: Iron 32, TIBC 441, Sat 7%, Ferritin 31. Hgb 8.3, Plt 313, WBC 4.2, MCV 102.5.  01/03/2021: establish care with Dr. Leonides Schanz  2/4-2/09/2021: IV feraheme 510mg  x 2 doses. Received 1 unit of PRBC on 01/12/2021  08/01/2021: WBC 4.5, Hgb 12.1, MCV 104.9, Plt 223 02/06/2022: WBC 5.7, Hgb 13.0, MCV 104.8, Plt 196 03/20/2023: Hgb 6.8, WBC 3.1, MCV 75.9, ferritin 3, Iron sat 2%. Patient received 2 units of PRBC.   Interval History:  Nicole Mccormick 78 y.o. female with medical history significant for iron deficiency anemia 2/2 to GI bleed who presents for a follow up visit. The patient's last visit was on 07/29/2023.   On exam today Nicole Mccormick notes reports that she has been her breath for 1 week.  She notes that she has not had any overt signs of bleeding, bruising, or dark stools.  She notes that she is having some dizziness and lightheadedness as well.  She notes that because of this she does tend to hold onto things when walking through her house.  She notes that she has been eating well and has tried to eat about 2 sticks per week.  She is also been having increased ice cravings lately.  She notes that she is able to take iron pills without difficulty and is willing to restart those.  Otherwise she has had no recent illnesses such as runny nose, sore throat, or cough.Marland Kitchen  Her weight has  been stable and she otherwise denies any fevers, chills, sweats, nausea, vomiting or diarrhea.  A full 10 point ROS is listed below.  MEDICAL HISTORY:  Past Medical History:  Diagnosis Date   Abnormal Pap smear    Aortic stenosis    TTE 2021 with mean gradient   Arthritis    Asthma    AVM (arteriovenous malformation)    Bradycardia    Complication of anesthesia    "they have a hard time bringing me back" (11/08/2015)   Coronary artery disease    High grade RCA disease; diagnosed during acute GIB; on medical therapy   Dynamic left ventricular outflow obstruction    GERD (gastroesophageal reflux disease)    previously on aciphex, discontinued november 2012  because patient asymptomatic, and concern about interference with HIV meds.  May try pepcid in the future if symptoms return   Heart murmur    Hepatitis C antibody test positive    HIV infection (HCC)    diagnosed before 2008   Hyperlipidemia 09/12/2021   Hypertension    Hypertensive heart disease    Iron deficiency anemia    Ferritin = 2 in november 2012, started on iron supplemenation   Pacemaker    a. [employer] PPM 01/2018.   Seizures (HCC)    Symptomatic anemia 08/24/2020   Varicosities     SURGICAL HISTORY: Past Surgical History:  Procedure Laterality Date   ABDOMINAL HYSTERECTOMY     CARDIAC CATHETERIZATION N/A 11/08/2015   Procedure: Left  Heart Cath and Coronary Angiography;  Surgeon: Corky Crafts, MD;  Location: [facility] INVASIVE CV LAB;  Service: Cardiovascular;  Laterality: N/A;   COLONOSCOPY  10/13/11   small adenoma, anal condyloma   COLONOSCOPY  10/13/2011   Procedure: COLONOSCOPY;  Surgeon: Iva Boop, MD;  Location: [facility] ENDOSCOPY;  Service: Endoscopy;  Laterality: N/A;   COLONOSCOPY N/A 09/14/2014   Procedure: COLONOSCOPY;  Surgeon: Willis Modena, MD;  Location: [facility] ENDOSCOPY;  Service: Endoscopy;  Laterality: N/A;   COLONOSCOPY WITH PROPOFOL N/A 06/05/2020   Procedure: COLONOSCOPY WITH PROPOFOL;   Surgeon: Willis Modena, MD;  Location: [facility] ENDOSCOPY;  Service: Endoscopy;  Laterality: N/A;   ENTEROSCOPY N/A 10/31/2020   Procedure: ENTEROSCOPY;  Surgeon: Kathi Der, MD;  Location: [facility] ENDOSCOPY;  Service: Gastroenterology;  Laterality: N/A;   ESOPHAGOGASTRODUODENOSCOPY  10/13/11   small hiatal hernia   ESOPHAGOGASTRODUODENOSCOPY  10/13/2011   Procedure: ESOPHAGOGASTRODUODENOSCOPY (EGD);  Surgeon: Iva Boop, MD;  Location: [facility] ENDOSCOPY;  Service: Endoscopy;  Laterality: N/A;   ESOPHAGOGASTRODUODENOSCOPY N/A 09/14/2014   Procedure: ESOPHAGOGASTRODUODENOSCOPY (EGD);  Surgeon: Willis Modena, MD;  Location: [facility] ENDOSCOPY;  Service: Endoscopy;  Laterality: N/A;   ESOPHAGOGASTRODUODENOSCOPY (EGD) WITH PROPOFOL N/A 06/05/2020   Procedure: ESOPHAGOGASTRODUODENOSCOPY (EGD) WITH PROPOFOL;  Surgeon: Willis Modena, MD;  Location: [facility] ENDOSCOPY;  Service: Endoscopy;  Laterality: N/A;   GIVENS CAPSULE STUDY N/A 09/14/2014   Procedure: GIVENS CAPSULE STUDY;  Surgeon: Willis Modena, MD;  Location: [facility] ENDOSCOPY;  Service: Endoscopy;  Laterality: N/A;   GIVENS CAPSULE STUDY N/A 08/26/2020   Procedure: GIVENS CAPSULE STUDY;  Surgeon: Graylin Shiver, MD;  Location: [facility] ENDOSCOPY;  Service: Endoscopy;  Laterality: N/A;   HOT HEMOSTASIS N/A 10/31/2020   Procedure: HOT HEMOSTASIS (ARGON PLASMA COAGULATION/BICAP);  Surgeon: Kathi Der, MD;  Location: [facility] ENDOSCOPY;  Service: Gastroenterology;  Laterality: N/A;   PACEMAKER IMPLANT N/A 02/05/2018   Procedure: PACEMAKER IMPLANT;  Surgeon: Hillis Range, MD;  Location: [facility] INVASIVE CV LAB;  Service: Cardiovascular;  Laterality: N/A;   POLYPECTOMY  06/05/2020   Procedure: POLYPECTOMY;  Surgeon: Willis Modena, MD;  Location: [facility] ENDOSCOPY;  Service: Endoscopy;;    SOCIAL HISTORY: Social History   Socioeconomic History   Marital status: Married    Spouse name: Not on file   Number of children: 6   Years of education: Not on file   Highest education  level: Not on file  Occupational History   Occupation: retired  Tobacco Use   Smoking status: Every Day    Current packs/day: 1.00    Average packs/day: 1 pack/day for 60.0 years (60.0 ttl pk-yrs)    Types: Cigarettes   Smokeless tobacco: Never   Tobacco comments:    Sometimes less.  Vaping Use   Vaping status: Never Used  Substance and Sexual Activity   Alcohol use: Yes    Alcohol/week: 10.0 standard drinks of alcohol    Types: 10 Glasses of wine per week    Comment: wine sometimes.   Drug use: No   Sexual activity: Yes    Partners: Male    Birth control/protection: Surgical    Comment: pt. given condoms 08/23/20  Other Topics Concern   Not on file  Social History Narrative   Not on file   Social Drivers of Health   Financial Resource Strain: High Risk (03/26/2023)   Overall Financial Resource Strain (CARDIA)    Difficulty of Paying Living Expenses: Hard  Food Insecurity: No Food Insecurity (03/26/2023)   Hunger Vital Sign    Worried  About Running Out of Food in the Last Year: Never true    Ran Out of Food in the Last Year: Never true  Transportation Needs: Unmet Transportation Needs (03/26/2023)   PRAPARE - Transportation    Lack of Transportation (Medical): Yes    Lack of Transportation (Non-Medical): Yes  Physical Activity: Inactive (03/26/2023)   Exercise Vital Sign    Days of Exercise per Week: 1 day    Minutes of Exercise per Session: 0 min  Stress: No Stress Concern Present (03/26/2023)   [employer] of Occupational Health - Occupational Stress Questionnaire    Feeling of Stress : Only a little  Social Connections: Socially Integrated (03/26/2023)   Social Connection and Isolation Panel [NHANES]    Frequency of Communication with Friends and Family: More than three times a week    Frequency of Social Gatherings with Friends and Family: More than three times a week    Attends Religious Services: More than 4 times per year    Active Member of [employer] or  Organizations: Yes    Attends Banker Meetings: 1 to 4 times per year    Marital Status: Married  Catering manager Violence: Not At Risk (03/26/2023)   Humiliation, Afraid, Rape, and Kick questionnaire    Fear of Current or Ex-Partner: No    Emotionally Abused: No    Physically Abused: No    Sexually Abused: No    FAMILY HISTORY: Family History  Problem Relation Age of Onset   Diabetes Mother    Ovarian cancer Sister    Colon cancer Sister     ALLERGIES:  is allergic to penicillins, penicillin g, and avelox [moxifloxacin hcl in nacl].  MEDICATIONS:  Current Outpatient Medications  Medication Sig Dispense Refill   acyclovir (ZOVIRAX) 400 MG tablet Take 1 tablet (400 mg total) by mouth daily as needed (for flare ups). 30 tablet 3   albuterol (PROAIR HFA) 108 (90 Base) MCG/ACT inhaler INHALE 2 PUFFS BY MOUTH EVERY 4 HOURS IF NEEDED FOR WHEEZING OR SHORTNESS OF BREATH (Patient taking differently: Inhale 2 puffs into the lungs every 4 (four) hours as needed for wheezing or shortness of breath.) 8.5 g 0   amLODipine (NORVASC) 10 MG tablet TAKE 1 TABLET BY MOUTH EVERY DAY 90 tablet 0   Ascorbic Acid (VITAMIN C) 1000 MG tablet Take 1,000 mg by mouth daily.     atorvastatin (LIPITOR) 40 MG tablet take 1 tablet by mouth once daily AT 6 PM (Patient taking differently: Take 40 mg by mouth every evening.) 90 tablet 0   BIKTARVY 50-200-25 MG TABS tablet TAKE 1 TABLET BY MOUTH EVERY DAY (Patient taking differently: Take 1 tablet by mouth daily.) 90 tablet 3   cholecalciferol (VITAMIN D3) 25 MCG (1000 UNIT) tablet Take 1,000 Units by mouth daily.     cyanocobalamin (VITAMIN B12) 1000 MCG tablet TAKE 1 TABLET BY MOUTH EVERY DAY 90 tablet 1   dextromethorphan (DELSYM) 30 MG/5ML liquid Take 30 mg by mouth as needed for cough.      diclofenac (VOLTAREN) 75 MG EC tablet Take 1 tablet (75 mg total) by mouth 2 (two) times daily as needed. Do not take until finished with steroid taper 60 tablet  2   Ensure (ENSURE) Take 237 mLs by mouth 2 (two) times daily between meals. 5688 mL 5   escitalopram (LEXAPRO) 10 MG tablet Take 1 tablet (10 mg total) by mouth daily as needed (anxiety). 30 tablet 3   famotidine (PEPCID) 20 MG  tablet Take 20 mg by mouth 2 (two) times daily.     ferrous sulfate 325 (65 FE) MG EC tablet Take 1 tablet (325 mg total) by mouth in the morning and at bedtime. 180 tablet 0   Flaxseed, Linseed, (FLAX SEED OIL) 1000 MG CAPS Take 1,000 mg by mouth daily.     folic acid (FOLVITE) 1 MG tablet TAKE 1 TABLET BY MOUTH EVERY DAY 90 tablet 1   gabapentin (NEURONTIN) 300 MG capsule Take 300 mg by mouth 2 (two) times daily.     hydrochlorothiazide (HYDRODIURIL) 25 MG tablet Take 25 mg by mouth daily.      HYDROcodone-acetaminophen (NORCO/VICODIN) 5-325 MG tablet Take 1 tablet by mouth 2 (two) times daily as needed.     isosorbide mononitrate (IMDUR) 60 MG 24 hr tablet Take 1 tablet (60 mg total) by mouth daily. 90 tablet 1   lisinopril (ZESTRIL) 40 MG tablet Take 40 mg by mouth daily.     loratadine (CLARITIN) 10 MG tablet Take 10 mg by mouth daily.     meclizine (ANTIVERT) 25 MG tablet Take 25 mg by mouth as needed for dizziness.      methocarbamol (ROBAXIN) 500 MG tablet Take 1 tablet (500 mg total) by mouth 2 (two) times daily as needed. 20 tablet 1   methocarbamol (ROBAXIN-750) 750 MG tablet Take 1 tablet (750 mg total) by mouth 2 (two) times daily as needed for muscle spasms. 20 tablet 1   metoprolol succinate (TOPROL-XL) 50 MG 24 hr tablet TAKE 1 TABLET BY MOUTH AT BEDTIME 90 tablet 0   Omega-3 Fatty Acids (FISH OIL) 1000 MG CPDR Take 1,000 mg by mouth daily.     ondansetron (ZOFRAN-ODT) 4 MG disintegrating tablet 1 tablet on the tongue and allow to dissolve     pantoprazole (PROTONIX) 40 MG tablet Take 1 tablet (40 mg total) by mouth daily. 90 tablet 0   predniSONE (STERAPRED UNI-PAK 21 TAB) 5 MG (21) TBPK tablet Take as directed 21 tablet 0   spironolactone (ALDACTONE) 25  MG tablet TAKE 1 TABLET BY MOUTH EVERY DAY (Patient taking differently: Take 25 mg by mouth daily.) 30 tablet 0   SYMBICORT 160-4.5 MCG/ACT inhaler Inhale 2 puffs into the lungs 2 (two) times daily.     traMADol (ULTRAM) 50 MG tablet Take 1 tablet (50 mg total) by mouth every 6 (six) hours as needed. 60 tablet 0   vitamin A 47829 UNIT capsule Take 10,000 Units by mouth daily.     No current facility-administered medications for this visit.   Facility-Administered Medications Ordered in Other Visits  Medication Dose Route Frequency Provider Last Rate Last Admin   acetaminophen (TYLENOL) tablet 650 mg  650 mg Oral Once Desma Mcgregor, RPH       diphenhydrAMINE (BENADRYL) capsule 25 mg  25 mg Oral Once Desma Mcgregor, [facility]       ferumoxytol [facility]) 510 mg in sodium chloride 0.9 % 100 mL IVPB  510 mg Intravenous Once Desma Mcgregor, [facility]        REVIEW OF SYSTEMS:   Constitutional: ( - ) fevers, ( - )  chills , ( - ) night sweats Eyes: ( - ) blurriness of vision, ( - ) double vision, ( - ) watery eyes Ears, nose, mouth, throat, and face: ( - ) mucositis, ( - ) sore throat Respiratory: ( - ) cough, ( - ) dyspnea, ( - ) wheezes Cardiovascular: ( - ) palpitation, ( - ) chest discomfort, ( - )  lower extremity swelling Gastrointestinal:  ( - ) nausea, ( - ) heartburn, ( - ) change in bowel habits Skin: ( - ) abnormal skin rashes Lymphatics: ( - ) new lymphadenopathy, ( - ) easy bruising Neurological: ( - ) numbness, ( - ) tingling, ( - ) new weaknesses Behavioral/Psych: ( - ) mood change, ( - ) new changes  All other systems were reviewed with the patient and are negative.  PHYSICAL EXAMINATION:  There were no vitals filed for this visit.    There were no vitals filed for this visit.     GENERAL: well appearing elderly African American female. alert, no distress and comfortable SKIN: skin color, texture, turgor are normal, no rashes or significant lesions EYES: conjunctiva are pink and  non-injected, sclera clear LUNGS: clear to auscultation and percussion with normal breathing effort HEART: regular rate & rhythm and no murmurs and no lower extremity edema Musculoskeletal: no cyanosis of digits and no clubbing  PSYCH: alert & oriented x 3, fluent speech NEURO: no focal motor/sensory deficits  LABORATORY DATA:  I have reviewed the data as listed    Latest Ref Rng & Units 10/21/2023    8:32 AM 09/29/2023    8:58 AM 09/16/2023    9:06 AM  CBC  WBC 4.0 - 10.5 K/uL 3.7  4.1  4.9   Hemoglobin 12.0 - 15.0 g/dL 13.0  86.5  78.4   Hematocrit 36.0 - 46.0 % 36.3  34.1  36.0   Platelets 150 - 400 K/uL 187  171  209        Latest Ref Rng & Units 10/21/2023    8:32 AM 09/16/2023    9:06 AM 08/25/2023    8:58 AM  CMP  Glucose 70 - 99 mg/dL 696  295  284   BUN 8 - 23 mg/dL 19  25  21    Creatinine 0.44 - 1.00 mg/dL 1.32  4.40  1.02   Sodium 135 - 145 mmol/L 139  138  138   Potassium 3.5 - 5.1 mmol/L 4.5  3.9  3.8   Chloride 98 - 111 mmol/L 103  102  102   CO2 22 - 32 mmol/L 32  30  31   Calcium 8.9 - 10.3 mg/dL 9.5  9.8  9.4   Total Protein 6.5 - 8.1 g/dL 7.6  7.8  7.7   Total Bilirubin <1.2 mg/dL 0.3  0.6  0.4   Alkaline Phos 38 - 126 U/L 64  75  75   AST 15 - 41 U/L 14  17  16    ALT 0 - 44 U/L 8  12  9      RADIOGRAPHIC STUDIES: No results found.  ASSESSMENT & PLAN Nicole Mccormick 78 y.o. female with medical history significant for iron deficiency anemia 2/2 to GI bleed who presents for a follow up visit.   After review the labs, the records, discussion the patient the findings most consistent with an iron deficiency anemia responding to IV iron therapy.  Given the macrocytosis I would recommend that the patient d/c ETOH use while she continues vitamin B12 and folate therapy.  She also reports that she drinks approximately 1 glass of wine per night which may be partially to blame for her macrocytosis.  We will plan to have her repeat labs with clinic visit in 6 months The  patient voiced understanding of this plan moving forward.  # Iron Deficiency Anemia 2/2 to GI Bleed   -- findings were consistent  with persistent iron deficiency anemia despite adequate PO iron therapy --labs today show white blood cell *** --patient is s/p 510mg  IV feraheme q 7 days x 2 doses on 2/4-2/10/22. Most recently patient received 2 doses of feraheme on 4/25-04/10/2023.  --patient follows with Dr. Dulce Sellar at [facility].  --continue PO ferrous sulfate 325mg  daily. Take with a source of Vitamin C (orange juice best)  --Due to her current anemia we will proceed with another 2 doses of IV Feraheme. --RTC in 4 to 6 weeks after her last dose of IV iron.  # Arthritis Pain --Patient requesting a refill on her Vicodin due to flares of her arthritis pain.  Pain is currently 7 out of 10 in the hands, knees, and back. -- Prescribed tramadol 50 mg every 8 hours as needed.  Recommend she touch base with her primary care provider for continued management of arthritis pain.  #Macrocytosis-resolved.  --given the timing of this macrocytosis (right around when she started drinking 1 glass of wine 3-4 x per week), ETOH could be the cause of this macrocytosis. She has decreased to 2 glasses per week.  --less likely secondary to deficiency with folate or vitamin b12 as she has been supplementing these nutrients --potentially represent robust reticulocytosis, but ETOH seems more likely at this time.   --patient not currently on any medications known to cause macrocytosis.   No orders of the defined types were placed in this encounter.  All questions were answered. The patient knows to call the clinic with any problems, questions or concerns.  A total of more than 30 minutes were spent on this encounter and over half of that time was spent on counseling and coordination of care as outlined above.   Ulysees Barns, MD Department of Hematology/Oncology [facility] Phone:  [phone] Pager: [phone] Email: [email or web address]  01/12/2024 7:40 AM

## 2024-01-15 ENCOUNTER — Encounter: Payer: Self-pay | Admitting: Hematology and Oncology

## 2024-01-21 ENCOUNTER — Telehealth: Payer: Self-pay | Admitting: Hematology and Oncology

## 2024-01-23 ENCOUNTER — Inpatient Hospital Stay: Payer: 59 | Admitting: Hematology and Oncology

## 2024-01-23 ENCOUNTER — Inpatient Hospital Stay: Payer: 59

## 2024-01-23 NOTE — Progress Notes (Signed)
Rescheduled

## 2024-01-26 ENCOUNTER — Telehealth: Payer: Self-pay | Admitting: Hematology and Oncology

## 2024-01-28 ENCOUNTER — Encounter: Payer: Self-pay | Admitting: Hematology and Oncology

## 2024-01-30 ENCOUNTER — Inpatient Hospital Stay: Payer: 59 | Admitting: Hematology and Oncology

## 2024-01-30 ENCOUNTER — Inpatient Hospital Stay: Payer: 59

## 2024-01-30 VITALS — BP 109/66 | HR 62 | Temp 97.1°F | Resp 13 | Wt 134.0 lb

## 2024-01-30 DIAGNOSIS — D500 Iron deficiency anemia secondary to blood loss (chronic): Secondary | ICD-10-CM | POA: Diagnosis not present

## 2024-01-30 DIAGNOSIS — K922 Gastrointestinal hemorrhage, unspecified: Secondary | ICD-10-CM | POA: Diagnosis present

## 2024-01-30 LAB — CMP (CANCER CENTER ONLY)
ALT: 8 U/L (ref 0–44)
AST: 14 U/L — ABNORMAL LOW (ref 15–41)
Albumin: 4.1 g/dL (ref 3.5–5.0)
Alkaline Phosphatase: 64 U/L (ref 38–126)
Anion gap: 4 — ABNORMAL LOW (ref 5–15)
BUN: 20 mg/dL (ref 8–23)
CO2: 32 mmol/L (ref 22–32)
Calcium: 9.1 mg/dL (ref 8.9–10.3)
Chloride: 103 mmol/L (ref 98–111)
Creatinine: 1.05 mg/dL — ABNORMAL HIGH (ref 0.44–1.00)
GFR, Estimated: 54 mL/min — ABNORMAL LOW (ref >60)
Glucose, Bld: 99 mg/dL (ref 70–99)
Potassium: 4 mmol/L (ref 3.5–5.1)
Sodium: 139 mmol/L (ref 135–145)
Total Bilirubin: 0.3 mg/dL (ref 0.0–1.2)
Total Protein: 6.9 g/dL (ref 6.5–8.1)

## 2024-01-30 LAB — RETIC PANEL
Immature Retic Fract: 26.4 % — ABNORMAL HIGH (ref 2.3–15.9)
RBC.: 2.78 MIL/uL — ABNORMAL LOW (ref 3.87–5.11)
Retic Count, Absolute: 161.2 10*3/uL (ref 19.0–186.0)
Retic Ct Pct: 5.8 % — ABNORMAL HIGH (ref 0.4–3.1)
Reticulocyte Hemoglobin: 36.8 pg (ref >27.9)

## 2024-01-30 LAB — CBC WITH DIFFERENTIAL (CANCER CENTER ONLY)
Abs Immature Granulocytes: 0.01 10*3/uL (ref 0.00–0.07)
Basophils Absolute: 0 10*3/uL (ref 0.0–0.1)
Basophils Relative: 1 %
Eosinophils Absolute: 0.1 10*3/uL (ref 0.0–0.5)
Eosinophils Relative: 2 %
HCT: 32.1 % — ABNORMAL LOW (ref 36.0–46.0)
Hemoglobin: 10.3 g/dL — ABNORMAL LOW (ref 12.0–15.0)
Immature Granulocytes: 0 %
Lymphocytes Relative: 25 %
Lymphs Abs: 1 10*3/uL (ref 0.7–4.0)
MCH: 37.9 pg — ABNORMAL HIGH (ref 26.0–34.0)
MCHC: 32.1 g/dL (ref 30.0–36.0)
MCV: 118 fL — ABNORMAL HIGH (ref 80.0–100.0)
Monocytes Absolute: 0.3 10*3/uL (ref 0.1–1.0)
Monocytes Relative: 9 %
Neutro Abs: 2.5 10*3/uL (ref 1.7–7.7)
Neutrophils Relative %: 63 %
Platelet Count: 194 10*3/uL (ref 150–400)
RBC: 2.72 MIL/uL — ABNORMAL LOW (ref 3.87–5.11)
RDW: 17.2 % — ABNORMAL HIGH (ref 11.5–15.5)
WBC Count: 4 10*3/uL (ref 4.0–10.5)
nRBC: 0.5 % — ABNORMAL HIGH (ref 0.0–0.2)

## 2024-01-30 LAB — IRON AND IRON BINDING CAPACITY (CC-WL,HP ONLY)
Iron: 61 ug/dL (ref 28–170)
Saturation Ratios: 15 % (ref 10.4–31.8)
TIBC: 414 ug/dL (ref 250–450)
UIBC: 353 ug/dL (ref 148–442)

## 2024-01-30 LAB — FERRITIN: Ferritin: 56 ng/mL (ref 11–307)

## 2024-01-30 LAB — LACTATE DEHYDROGENASE: LDH: 185 U/L (ref 98–192)

## 2024-01-30 NOTE — Progress Notes (Signed)
[facility] [phone]   [phone]  PROGRESS NOTE  Patient Care Team: Raymon Mutton., FNP as PCP - General (Family Medicine) Ninetta Lights Lacretia Leigh, MD as PCP - Infectious Diseases (Infectious Diseases) Mealor, Roberts Gaudy, MD as PCP - Electrophysiology (Cardiology) Willis Modena, MD as Consulting Physician (Gastroenterology)  Hematological/Oncological History # Iron Deficiency Anemia 2/2 to GI Bleed  10/29/2020: patient presented the ED with weakness and was found to have a Hgb of 4.1 11/01/2020: GI evaluation showed numerous bleeding/nonbleeding AVMS. Cauterization performed.  12/18/2020: Iron 32, TIBC 441, Sat 7%, Ferritin 31. Hgb 8.3, Plt 313, WBC 4.2, MCV 102.5.  01/03/2021: establish care with Dr. Leonides Schanz  2/4-2/09/2021: IV feraheme 510mg  x 2 doses. Received 1 unit of PRBC on 01/12/2021  08/01/2021: WBC 4.5, Hgb 12.1, MCV 104.9, Plt 223 02/06/2022: WBC 5.7, Hgb 13.0, MCV 104.8, Plt 196 03/20/2023: Hgb 6.8, WBC 3.1, MCV 75.9, ferritin 3, Iron sat 2%. Patient received 2 units of PRBC.   Interval History:  Nicole Mccormick 78 y.o. female with medical history significant for iron deficiency anemia 2/2 to GI bleed who presents for a follow up visit. The patient's last visit was on 10/21/2023.   On exam today Nicole Mccormick notes she tolerated her IV iron therapy well back in January.  She reports she did receive a boost in energy.  She notes her energy today is only so-so.  She reports that she does have some shortness of breath on exertion.  She reports that she has not had any overt signs of bleeding, bruising, or dark stools.  She reports she is tolerating her iron Gummies easily.  She is not having any nausea, vomiting, or diarrhea.  She reports she does have some occasional lightheadedness and dizziness.  She does her best to try to eat red meat.  She does not eat much in the way of broccoli or spinach.  She does enjoy eating pork approximate 2-3 times per week.  She  notes she did have the flu about 1 week ago and her symptoms have subsequently resolved.  Her weight has been stable and she otherwise denies any fevers, chills, sweats, nausea, vomiting or diarrhea.  A full 10 point ROS is listed below.  MEDICAL HISTORY:  Past Medical History:  Diagnosis Date   Abnormal Pap smear    Aortic stenosis    TTE 2021 with mean gradient   Arthritis    Asthma    AVM (arteriovenous malformation)    Bradycardia    Complication of anesthesia    "they have a hard time bringing me back" (11/08/2015)   Coronary artery disease    High grade RCA disease; diagnosed during acute GIB; on medical therapy   Dynamic left ventricular outflow obstruction    GERD (gastroesophageal reflux disease)    previously on aciphex, discontinued november 2012  because patient asymptomatic, and concern about interference with HIV meds.  May try pepcid in the future if symptoms return   Heart murmur    Hepatitis C antibody test positive    HIV infection (HCC)    diagnosed before 2008   Hyperlipidemia 09/12/2021   Hypertension    Hypertensive heart disease    Iron deficiency anemia    Ferritin = 2 in november 2012, started on iron supplemenation   Pacemaker    a. St Jude PPM 01/2018.   Seizures (HCC)    Symptomatic anemia 08/24/2020   Varicosities     SURGICAL HISTORY: Past Surgical History:  Procedure Laterality Date   ABDOMINAL HYSTERECTOMY     CARDIAC CATHETERIZATION N/A 11/08/2015   Procedure: Left Heart Cath and Coronary Angiography;  Surgeon: Corky Crafts, MD;  Location: [facility] INVASIVE CV LAB;  Service: Cardiovascular;  Laterality: N/A;   COLONOSCOPY  10/13/11   small adenoma, anal condyloma   COLONOSCOPY  10/13/2011   Procedure: COLONOSCOPY;  Surgeon: Iva Boop, MD;  Location: [facility] ENDOSCOPY;  Service: Endoscopy;  Laterality: N/A;   COLONOSCOPY N/A 09/14/2014   Procedure: COLONOSCOPY;  Surgeon: Willis Modena, MD;  Location: [facility] ENDOSCOPY;  Service: Endoscopy;   Laterality: N/A;   COLONOSCOPY WITH PROPOFOL N/A 06/05/2020   Procedure: COLONOSCOPY WITH PROPOFOL;  Surgeon: Willis Modena, MD;  Location: [facility] ENDOSCOPY;  Service: Endoscopy;  Laterality: N/A;   ENTEROSCOPY N/A 10/31/2020   Procedure: ENTEROSCOPY;  Surgeon: Kathi Der, MD;  Location: [facility] ENDOSCOPY;  Service: Gastroenterology;  Laterality: N/A;   ESOPHAGOGASTRODUODENOSCOPY  10/13/11   small hiatal hernia   ESOPHAGOGASTRODUODENOSCOPY  10/13/2011   Procedure: ESOPHAGOGASTRODUODENOSCOPY (EGD);  Surgeon: Iva Boop, MD;  Location: [facility] ENDOSCOPY;  Service: Endoscopy;  Laterality: N/A;   ESOPHAGOGASTRODUODENOSCOPY N/A 09/14/2014   Procedure: ESOPHAGOGASTRODUODENOSCOPY (EGD);  Surgeon: Willis Modena, MD;  Location: [facility] ENDOSCOPY;  Service: Endoscopy;  Laterality: N/A;   ESOPHAGOGASTRODUODENOSCOPY (EGD) WITH PROPOFOL N/A 06/05/2020   Procedure: ESOPHAGOGASTRODUODENOSCOPY (EGD) WITH PROPOFOL;  Surgeon: Willis Modena, MD;  Location: [facility] ENDOSCOPY;  Service: Endoscopy;  Laterality: N/A;   GIVENS CAPSULE STUDY N/A 09/14/2014   Procedure: GIVENS CAPSULE STUDY;  Surgeon: Willis Modena, MD;  Location: [facility] ENDOSCOPY;  Service: Endoscopy;  Laterality: N/A;   GIVENS CAPSULE STUDY N/A 08/26/2020   Procedure: GIVENS CAPSULE STUDY;  Surgeon: Graylin Shiver, MD;  Location: [facility] ENDOSCOPY;  Service: Endoscopy;  Laterality: N/A;   HOT HEMOSTASIS N/A 10/31/2020   Procedure: HOT HEMOSTASIS (ARGON PLASMA COAGULATION/BICAP);  Surgeon: Kathi Der, MD;  Location: [facility] ENDOSCOPY;  Service: Gastroenterology;  Laterality: N/A;   PACEMAKER IMPLANT N/A 02/05/2018   Procedure: PACEMAKER IMPLANT;  Surgeon: Hillis Range, MD;  Location: [facility] INVASIVE CV LAB;  Service: Cardiovascular;  Laterality: N/A;   POLYPECTOMY  06/05/2020   Procedure: POLYPECTOMY;  Surgeon: Willis Modena, MD;  Location: [facility] ENDOSCOPY;  Service: Endoscopy;;    SOCIAL HISTORY: Social History   Socioeconomic History   Marital status: Married     Spouse name: Not on file   Number of children: 6   Years of education: Not on file   Highest education level: Not on file  Occupational History   Occupation: retired  Tobacco Use   Smoking status: Every Day    Current packs/day: 1.00    Average packs/day: 1 pack/day for 60.0 years (60.0 ttl pk-yrs)    Types: Cigarettes   Smokeless tobacco: Never   Tobacco comments:    Sometimes less.  Vaping Use   Vaping status: Never Used  Substance and Sexual Activity   Alcohol use: Yes    Alcohol/week: 10.0 standard drinks of alcohol    Types: 10 Glasses of wine per week    Comment: wine sometimes.   Drug use: No   Sexual activity: Yes    Partners: Male    Birth control/protection: Surgical    Comment: pt. given condoms 08/23/20  Other Topics Concern   Not on file  Social History Narrative   Not on file   Social Drivers of Health   Financial Resource Strain: High Risk (03/26/2023)   Overall Financial Resource Strain (CARDIA)    Difficulty of Paying  Living Expenses: Hard  Food Insecurity: No Food Insecurity (03/26/2023)   Hunger Vital Sign    Worried About Running Out of Food in the Last Year: Never true    Ran Out of Food in the Last Year: Never true  Transportation Needs: Unmet Transportation Needs (03/26/2023)   PRAPARE - Transportation    Lack of Transportation (Medical): Yes    Lack of Transportation (Non-Medical): Yes  Physical Activity: Inactive (03/26/2023)   Exercise Vital Sign    Days of Exercise per Week: 1 day    Minutes of Exercise per Session: 0 min  Stress: No Stress Concern Present (03/26/2023)   [employer] of Occupational Health - Occupational Stress Questionnaire    Feeling of Stress : Only a little  Social Connections: Socially Integrated (03/26/2023)   Social Connection and Isolation Panel [NHANES]    Frequency of Communication with Friends and Family: More than three times a week    Frequency of Social Gatherings with Friends and Family: More than three  times a week    Attends Religious Services: More than 4 times per year    Active Member of [employer] or Organizations: Yes    Attends Banker Meetings: 1 to 4 times per year    Marital Status: Married  Catering manager Violence: Not At Risk (03/26/2023)   Humiliation, Afraid, Rape, and Kick questionnaire    Fear of Current or Ex-Partner: No    Emotionally Abused: No    Physically Abused: No    Sexually Abused: No    FAMILY HISTORY: Family History  Problem Relation Age of Onset   Diabetes Mother    Ovarian cancer Sister    Colon cancer Sister     ALLERGIES:  is allergic to penicillins, penicillin g, and avelox [moxifloxacin hcl in nacl].  MEDICATIONS:  Current Outpatient Medications  Medication Sig Dispense Refill   acyclovir (ZOVIRAX) 400 MG tablet Take 1 tablet (400 mg total) by mouth daily as needed (for flare ups). 30 tablet 3   albuterol (PROAIR HFA) 108 (90 Base) MCG/ACT inhaler INHALE 2 PUFFS BY MOUTH EVERY 4 HOURS IF NEEDED FOR WHEEZING OR SHORTNESS OF BREATH (Patient taking differently: Inhale 2 puffs into the lungs every 4 (four) hours as needed for wheezing or shortness of breath.) 8.5 g 0   Ascorbic Acid (VITAMIN C) 1000 MG tablet Take 1,000 mg by mouth daily.     atorvastatin (LIPITOR) 40 MG tablet take 1 tablet by mouth once daily AT 6 PM (Patient taking differently: Take 40 mg by mouth every evening.) 90 tablet 0   BIKTARVY 50-200-25 MG TABS tablet TAKE 1 TABLET BY MOUTH EVERY DAY (Patient taking differently: Take 1 tablet by mouth daily.) 90 tablet 3   cholecalciferol (VITAMIN D3) 25 MCG (1000 UNIT) tablet Take 1,000 Units by mouth daily.     cyanocobalamin (VITAMIN B12) 1000 MCG tablet TAKE 1 TABLET BY MOUTH EVERY DAY 90 tablet 1   dextromethorphan (DELSYM) 30 MG/5ML liquid Take 30 mg by mouth as needed for cough.      diclofenac (VOLTAREN) 75 MG EC tablet Take 1 tablet (75 mg total) by mouth 2 (two) times daily as needed. Do not take until finished with  steroid taper 60 tablet 2   Ensure (ENSURE) Take 237 mLs by mouth 2 (two) times daily between meals. 5688 mL 5   escitalopram (LEXAPRO) 10 MG tablet Take 1 tablet (10 mg total) by mouth daily as needed (anxiety). 30 tablet 3   famotidine (PEPCID)  20 MG tablet Take 20 mg by mouth 2 (two) times daily.     ferrous sulfate 325 (65 FE) MG EC tablet Take 1 tablet (325 mg total) by mouth in the morning and at bedtime. 180 tablet 0   Flaxseed, Linseed, (FLAX SEED OIL) 1000 MG CAPS Take 1,000 mg by mouth daily.     folic acid (FOLVITE) 1 MG tablet TAKE 1 TABLET BY MOUTH EVERY DAY 90 tablet 1   gabapentin (NEURONTIN) 300 MG capsule Take 300 mg by mouth 2 (two) times daily.     hydrochlorothiazide (HYDRODIURIL) 25 MG tablet Take 25 mg by mouth daily.      HYDROcodone-acetaminophen (NORCO/VICODIN) 5-325 MG tablet Take 1 tablet by mouth 2 (two) times daily as needed.     isosorbide mononitrate (IMDUR) 60 MG 24 hr tablet Take 1 tablet (60 mg total) by mouth daily. 90 tablet 1   lisinopril (ZESTRIL) 40 MG tablet Take 40 mg by mouth daily.     loratadine (CLARITIN) 10 MG tablet Take 10 mg by mouth daily.     meclizine (ANTIVERT) 25 MG tablet Take 25 mg by mouth as needed for dizziness.      methocarbamol (ROBAXIN) 500 MG tablet Take 1 tablet (500 mg total) by mouth 2 (two) times daily as needed. 20 tablet 1   methocarbamol (ROBAXIN-750) 750 MG tablet Take 1 tablet (750 mg total) by mouth 2 (two) times daily as needed for muscle spasms. 20 tablet 1   metoprolol succinate (TOPROL-XL) 50 MG 24 hr tablet TAKE 1 TABLET BY MOUTH AT BEDTIME 90 tablet 0   Omega-3 Fatty Acids (FISH OIL) 1000 MG CPDR Take 1,000 mg by mouth daily.     ondansetron (ZOFRAN-ODT) 4 MG disintegrating tablet 1 tablet on the tongue and allow to dissolve     pantoprazole (PROTONIX) 40 MG tablet Take 1 tablet (40 mg total) by mouth daily. 90 tablet 0   predniSONE (STERAPRED UNI-PAK 21 TAB) 5 MG (21) TBPK tablet Take as directed 21 tablet 0    spironolactone (ALDACTONE) 25 MG tablet TAKE 1 TABLET BY MOUTH EVERY DAY (Patient taking differently: Take 25 mg by mouth daily.) 30 tablet 0   SYMBICORT 160-4.5 MCG/ACT inhaler Inhale 2 puffs into the lungs 2 (two) times daily.     traMADol (ULTRAM) 50 MG tablet Take 1 tablet (50 mg total) by mouth every 6 (six) hours as needed. 60 tablet 0   vitamin A 95284 UNIT capsule Take 10,000 Units by mouth daily.     No current facility-administered medications for this visit.   Facility-Administered Medications Ordered in Other Visits  Medication Dose Route Frequency Provider Last Rate Last Admin   acetaminophen (TYLENOL) tablet 650 mg  650 mg Oral Once Desma Mcgregor, RPH       diphenhydrAMINE (BENADRYL) capsule 25 mg  25 mg Oral Once Desma Mcgregor, [facility]       ferumoxytol [facility]) 510 mg in sodium chloride 0.9 % 100 mL IVPB  510 mg Intravenous Once Desma Mcgregor, [facility]        REVIEW OF SYSTEMS:   Constitutional: ( - ) fevers, ( - )  chills , ( - ) night sweats Eyes: ( - ) blurriness of vision, ( - ) double vision, ( - ) watery eyes Ears, nose, mouth, throat, and face: ( - ) mucositis, ( - ) sore throat Respiratory: ( - ) cough, ( - ) dyspnea, ( - ) wheezes Cardiovascular: ( - ) palpitation, ( - ) chest  discomfort, ( - ) lower extremity swelling Gastrointestinal:  ( - ) nausea, ( - ) heartburn, ( - ) change in bowel habits Skin: ( - ) abnormal skin rashes Lymphatics: ( - ) new lymphadenopathy, ( - ) easy bruising Neurological: ( - ) numbness, ( - ) tingling, ( - ) new weaknesses Behavioral/Psych: ( - ) mood change, ( - ) new changes  All other systems were reviewed with the patient and are negative.  PHYSICAL EXAMINATION:  Vitals:   01/30/24 1059  BP: 109/66  Pulse: 62  Resp: 13  Temp: (!) 97.1 F (36.2 C)  SpO2: 98%   Filed Weights   01/30/24 1059  Weight: 134 lb (60.8 kg)    GENERAL: well appearing elderly African American female. alert, no distress and comfortable SKIN: skin color,  texture, turgor are normal, no rashes or significant lesions EYES: conjunctiva are pink and non-injected, sclera clear LUNGS: clear to auscultation and percussion with normal breathing effort HEART: regular rate & rhythm and no murmurs and no lower extremity edema Musculoskeletal: no cyanosis of digits and no clubbing  PSYCH: alert & oriented x 3, fluent speech NEURO: no focal motor/sensory deficits  LABORATORY DATA:  I have reviewed the data as listed    Latest Ref Rng & Units 01/30/2024   10:27 AM 10/21/2023    8:32 AM 09/29/2023    8:58 AM  CBC  WBC 4.0 - 10.5 K/uL 4.0  3.7  4.1   Hemoglobin 12.0 - 15.0 g/dL 16.1  09.6  04.5   Hematocrit 36.0 - 46.0 % 32.1  36.3  34.1   Platelets 150 - 400 K/uL 194  187  171        Latest Ref Rng & Units 01/30/2024   10:27 AM 10/21/2023    8:32 AM 09/16/2023    9:06 AM  CMP  Glucose 70 - 99 mg/dL 99  409  811   BUN 8 - 23 mg/dL 20  19  25    Creatinine 0.44 - 1.00 mg/dL 9.14  7.82  9.56   Sodium 135 - 145 mmol/L 139  139  138   Potassium 3.5 - 5.1 mmol/L 4.0  4.5  3.9   Chloride 98 - 111 mmol/L 103  103  102   CO2 22 - 32 mmol/L 32  32  30   Calcium 8.9 - 10.3 mg/dL 9.1  9.5  9.8   Total Protein 6.5 - 8.1 g/dL 6.9  7.6  7.8   Total Bilirubin 0.0 - 1.2 mg/dL 0.3  0.3  0.6   Alkaline Phos 38 - 126 U/L 64  64  75   AST 15 - 41 U/L 14  14  17    ALT 0 - 44 U/L 8  8  12      RADIOGRAPHIC STUDIES: No results found.  ASSESSMENT & PLAN Nicole Mccormick 78 y.o. female with medical history significant for iron deficiency anemia 2/2 to GI bleed who presents for a follow up visit.   After review the labs, the records, discussion the patient the findings most consistent with an iron deficiency anemia responding to IV iron therapy.  Given the macrocytosis I would recommend that the patient d/c ETOH use while she continues vitamin B12 and folate therapy.  She also reports that she drinks approximately 1 glass of wine per night which may be partially to blame  for her macrocytosis.  We will plan to have her repeat labs with clinic visit in 6 months The patient  voiced understanding of this plan moving forward.  # Iron Deficiency Anemia 2/2 to GI Bleed   -- findings were consistent with persistent iron deficiency anemia despite adequate PO iron therapy --labs today show white blood cell 3.7, hemoglobin 9.3, MCV 100, and platelets of 236 --patient is s/p 510mg  IV feraheme q 7 days x 2 doses on 2/4-2/10/22. Most recently patient received 2 doses of feraheme on 4/25-04/10/2023 and again on 12/16/2023-12/23/2023.  --patient follows with Dr. Dulce Sellar at [facility].  --continue PO ferrous sulfate 325mg  daily. Take with a source of Vitamin C (orange juice best)  --RTC in 3 months or sooner if more IV iron is requried.   # Arthritis Pain --Patient requesting a refill on her Vicodin due to flares of her arthritis pain.  Pain is currently 7 out of 10 in the hands, knees, and back. -- Prescribed tramadol 50 mg every 8 hours as needed.  Recommend she touch base with her primary care provider for continued management of arthritis pain.  #Macrocytosis-resolved.  --given the timing of this macrocytosis (right around when she started drinking 1 glass of wine 3-4 x per week), ETOH could be the cause of this macrocytosis. She has decreased to 2 glasses per week.  --less likely secondary to deficiency with folate or vitamin b12 as she has been supplementing these nutrients --potentially represent robust reticulocytosis, but ETOH seems more likely at this time.   --patient not currently on any medications known to cause macrocytosis.   No orders of the defined types were placed in this encounter.  All questions were answered. The patient knows to call the clinic with any problems, questions or concerns.  A total of more than 30 minutes were spent on this encounter and over half of that time was spent on counseling and coordination of care as outlined above.   Ulysees Barns,  MD Department of Hematology/Oncology [facility] Phone: [phone] Pager: [phone] Email: [email or web address]  01/30/2024 5:07 PM

## 2024-02-26 ENCOUNTER — Other Ambulatory Visit: Payer: Self-pay | Admitting: Student

## 2024-03-03 ENCOUNTER — Encounter: Payer: Self-pay | Admitting: Hematology and Oncology

## 2024-03-10 ENCOUNTER — Telehealth: Payer: Self-pay | Admitting: *Deleted

## 2024-03-10 DIAGNOSIS — E43 Unspecified severe protein-calorie malnutrition: Secondary | ICD-10-CM

## 2024-03-10 NOTE — Telephone Encounter (Signed)
 Copied from CRM [id number]. Topic: Clinical - Prescription Issue >> Mar 10, 2024  2:44 PM Carrielelia G wrote: Patient asking for an order or prescription for Ensure (ENSURE) be sent to [facility]. Please advise.

## 2024-03-10 NOTE — Telephone Encounter (Signed)
 Pt stated send rx to [facility] on [facility].

## 2024-03-11 ENCOUNTER — Other Ambulatory Visit: Payer: Self-pay | Admitting: Infectious Diseases

## 2024-03-11 DIAGNOSIS — E43 Unspecified severe protein-calorie malnutrition: Secondary | ICD-10-CM

## 2024-03-11 MED ORDER — ENSURE PO LIQD: 237.0000 mL | Freq: Two times a day (BID) | ORAL | 3 refills | Status: DC

## 2024-03-11 NOTE — Telephone Encounter (Signed)
 I talked to [place] who stated for Dr Ninetta Lights to put in a Referral for DME, in comments state for Ensure. And she will fax the order to [facility].

## 2024-03-11 NOTE — Telephone Encounter (Signed)
 Nicole Chancy do you have a fax number to [facility]?

## 2024-03-11 NOTE — Telephone Encounter (Signed)
 I called the pt who stated the SW always handled this. I called [facility] - I had to leave a message to call the office. Thanks Megargel for your help.

## 2024-03-15 NOTE — Telephone Encounter (Signed)
 Received a call from [facility] with [facility]. I let her know we had fax an order for pt to get Ensure; stated she will find who pt's caseworker and let me know if they had received the order.

## 2024-03-22 NOTE — Telephone Encounter (Addendum)
 Source  Lauralee Poll (Non-Patient)   Subject     Topic  General - Other     Communication  Reason for CRM: Lauralee Poll with [facility] called to speak with RN Stevens Eland. I let her know lunch is from 12-1pm. She said she was calling to provide her with the fax number, [phone]. I asked her what patient this call is in regards to and she said she apologized but she couldn't remember. She was just calling to provide fax number. Call back number is [phone].                 Called / talked to [place]; informed this should have already been taken care of.

## 2024-03-24 ENCOUNTER — Encounter: Admitting: Infectious Diseases

## 2024-03-24 NOTE — Telephone Encounter (Signed)
 Appt has been resch to the following:  Name: Nicole Mccormick, Nicole Mccormick MRN: [id number]  Date: 04/06/2024 Status: Sch  Time: 1:30 PM Length: 15  Visit Type: [facility] INFECTIOUS DISEASE [2216] Copay: $0.00  Provider: Sandie Cross, MD       Copied from CRM [id number]. Topic: Appointments - Scheduling Inquiry for Clinic >> Mar 24, 2024 12:27 PM [name] wrote: Reason for CRM: Patient would like a call back at [phone] to reschedule her appointment that was today 03/24/24 at 1:30pm

## 2024-03-31 ENCOUNTER — Telehealth: Payer: Self-pay | Admitting: *Deleted

## 2024-03-31 NOTE — Telephone Encounter (Signed)
 Several attempts were made to contact patient about appointment being rescheduled with Dr. Alwin Baars.  No answer and no VM.  Rescheduled appointment put in mail.

## 2024-04-06 ENCOUNTER — Encounter: Admitting: Infectious Diseases

## 2024-04-12 ENCOUNTER — Other Ambulatory Visit: Payer: 59

## 2024-04-12 ENCOUNTER — Other Ambulatory Visit: Payer: Self-pay | Admitting: Physician Assistant

## 2024-04-12 ENCOUNTER — Ambulatory Visit: Payer: 59 | Admitting: Hematology and Oncology

## 2024-04-12 ENCOUNTER — Other Ambulatory Visit: Payer: Self-pay

## 2024-04-12 DIAGNOSIS — D7589 Other specified diseases of blood and blood-forming organs: Secondary | ICD-10-CM

## 2024-04-12 DIAGNOSIS — D500 Iron deficiency anemia secondary to blood loss (chronic): Secondary | ICD-10-CM

## 2024-04-13 ENCOUNTER — Inpatient Hospital Stay (HOSPITAL_BASED_OUTPATIENT_CLINIC_OR_DEPARTMENT_OTHER): Admitting: Physician Assistant

## 2024-04-13 ENCOUNTER — Inpatient Hospital Stay: Attending: Hematology and Oncology

## 2024-04-13 VITALS — BP 96/66 | HR 64 | Temp 97.6°F | Resp 13 | Wt 130.5 lb

## 2024-04-13 DIAGNOSIS — Z800 Family history of malignant neoplasm of digestive organs: Secondary | ICD-10-CM | POA: Diagnosis not present

## 2024-04-13 DIAGNOSIS — D7589 Other specified diseases of blood and blood-forming organs: Secondary | ICD-10-CM

## 2024-04-13 DIAGNOSIS — K922 Gastrointestinal hemorrhage, unspecified: Secondary | ICD-10-CM | POA: Insufficient documentation

## 2024-04-13 DIAGNOSIS — M1990 Unspecified osteoarthritis, unspecified site: Secondary | ICD-10-CM | POA: Insufficient documentation

## 2024-04-13 DIAGNOSIS — D500 Iron deficiency anemia secondary to blood loss (chronic): Secondary | ICD-10-CM | POA: Diagnosis not present

## 2024-04-13 DIAGNOSIS — Z8041 Family history of malignant neoplasm of ovary: Secondary | ICD-10-CM | POA: Diagnosis not present

## 2024-04-13 DIAGNOSIS — M2550 Pain in unspecified joint: Secondary | ICD-10-CM

## 2024-04-13 DIAGNOSIS — F17210 Nicotine dependence, cigarettes, uncomplicated: Secondary | ICD-10-CM | POA: Insufficient documentation

## 2024-04-13 LAB — CBC WITH DIFFERENTIAL (CANCER CENTER ONLY)
Abs Immature Granulocytes: 0.01 10*3/uL (ref 0.00–0.07)
Basophils Absolute: 0 10*3/uL (ref 0.0–0.1)
Basophils Relative: 1 %
Eosinophils Absolute: 0.1 10*3/uL (ref 0.0–0.5)
Eosinophils Relative: 2 %
HCT: 33.4 % — ABNORMAL LOW (ref 36.0–46.0)
Hemoglobin: 10.2 g/dL — ABNORMAL LOW (ref 12.0–15.0)
Immature Granulocytes: 0 %
Lymphocytes Relative: 28 %
Lymphs Abs: 1 10*3/uL (ref 0.7–4.0)
MCH: 32.3 pg (ref 26.0–34.0)
MCHC: 30.5 g/dL (ref 30.0–36.0)
MCV: 105.7 fL — ABNORMAL HIGH (ref 80.0–100.0)
Monocytes Absolute: 0.3 10*3/uL (ref 0.1–1.0)
Monocytes Relative: 9 %
Neutro Abs: 2.1 10*3/uL (ref 1.7–7.7)
Neutrophils Relative %: 60 %
Platelet Count: 232 10*3/uL (ref 150–400)
RBC: 3.16 MIL/uL — ABNORMAL LOW (ref 3.87–5.11)
RDW: 15.2 % (ref 11.5–15.5)
WBC Count: 3.5 10*3/uL — ABNORMAL LOW (ref 4.0–10.5)
nRBC: 0 % (ref 0.0–0.2)

## 2024-04-13 LAB — CMP (CANCER CENTER ONLY)
ALT: 8 U/L (ref 0–44)
AST: 16 U/L (ref 15–41)
Albumin: 4.2 g/dL (ref 3.5–5.0)
Alkaline Phosphatase: 65 U/L (ref 38–126)
Anion gap: 5 (ref 5–15)
BUN: 25 mg/dL — ABNORMAL HIGH (ref 8–23)
CO2: 30 mmol/L (ref 22–32)
Calcium: 9.3 mg/dL (ref 8.9–10.3)
Chloride: 103 mmol/L (ref 98–111)
Creatinine: 1 mg/dL (ref 0.44–1.00)
GFR, Estimated: 57 mL/min — ABNORMAL LOW (ref >60)
Glucose, Bld: 110 mg/dL — ABNORMAL HIGH (ref 70–99)
Potassium: 3.8 mmol/L (ref 3.5–5.1)
Sodium: 138 mmol/L (ref 135–145)
Total Bilirubin: 0.3 mg/dL (ref 0.0–1.2)
Total Protein: 7.5 g/dL (ref 6.5–8.1)

## 2024-04-13 LAB — FOLATE: Folate: 16.6 ng/mL (ref >5.9)

## 2024-04-13 LAB — FERRITIN: Ferritin: 46 ng/mL (ref 11–307)

## 2024-04-13 LAB — IRON AND IRON BINDING CAPACITY (CC-WL,HP ONLY)
Iron: 330 ug/dL — ABNORMAL HIGH (ref 28–170)
Saturation Ratios: 70 % — ABNORMAL HIGH (ref 10.4–31.8)
TIBC: 470 ug/dL — ABNORMAL HIGH (ref 250–450)
UIBC: 140 ug/dL — ABNORMAL LOW (ref 148–442)

## 2024-04-13 LAB — VITAMIN B12: Vitamin B-12: 474 pg/mL (ref 180–914)

## 2024-04-13 MED ORDER — TRAMADOL HCL 50 MG PO TABS: 50.0000 mg | ORAL_TABLET | Freq: Four times a day (QID) | ORAL | 0 refills | Status: AC | PRN

## 2024-04-13 NOTE — Progress Notes (Signed)
 [facility] [phone]   [phone]  PROGRESS NOTE  Patient Care Team: Shannan Dart., FNP as PCP - General (Family Medicine) Alwin Baars Aretha Kubas, MD as PCP - Infectious Diseases (Infectious Diseases) Mealor, Donnamae Gaba, MD as PCP - Electrophysiology (Cardiology) Evangeline Hilts, MD as Consulting Physician (Gastroenterology)  Hematological/Oncological History # Iron Deficiency Anemia 2/2 to GI Bleed  10/29/2020: patient presented the ED with weakness and was found to have a Hgb of 4.1 11/01/2020: GI evaluation showed numerous bleeding/nonbleeding AVMS. Cauterization performed.  12/18/2020: Iron 32, TIBC 441, Sat 7%, Ferritin 31. Hgb 8.3, Plt 313, WBC 4.2, MCV 102.5.  01/03/2021: establish care with Dr. Rosaline Coma  2/4-2/09/2021: IV feraheme  510mg  x 2 doses. Received 1 unit of PRBC on 01/12/2021  04/03/23-04/10/23: IV feraheme  510 mg x 2 doses 08/11/23-09/10/23: IV feraheme  510 mg x 2 doses 12/16/2023-12/16/2023: IV feraheme  510 mg x 2 doses  Interval History:  Nicole Mccormick 78 y.o. female with medical history significant for iron deficiency anemia 2/2 to GI bleed who presents for a follow up visit. The patient's last visit was on 01/30/2024  On exam today Nicole Mccormick reports that her energy levels are decreased some since the last visit. She continues to complete her ADLs on her own. She doe shave shortness of breath with minimal exertion due to COPD. She has lightheadedness without any syncopal episodes. She denies easy bruising or overt signs of bleeding. She has ongoing back pain and arthritic pain that is managed with tramadol . She reports that she recently moved to [place] and plans to establish with a new PCP in the area this Thursday. She denies fevers, chills, sweats, chest pain or headaches. She has no other complaints. Rest of the ROS is below.   MEDICAL HISTORY:  Past Medical History:  Diagnosis Date   Abnormal Pap smear    Aortic stenosis    TTE 2021  with mean gradient   Arthritis    Asthma    AVM (arteriovenous malformation)    Bradycardia    Complication of anesthesia    "they have a hard time bringing me back" (11/08/2015)   Coronary artery disease    High grade RCA disease; diagnosed during acute GIB; on medical therapy   Dynamic left ventricular outflow obstruction    GERD (gastroesophageal reflux disease)    previously on aciphex , discontinued november 2012  because patient asymptomatic, and concern about interference with HIV meds.  May try pepcid  in the future if symptoms return   Heart murmur    Hepatitis C antibody test positive    HIV infection (HCC)    diagnosed before 2008   Hyperlipidemia 09/12/2021   Hypertension    Hypertensive heart disease    Iron deficiency anemia    Ferritin = 2 in november 2012, started on iron supplemenation   Pacemaker    a. St Jude PPM 01/2018.   Seizures (HCC)    Symptomatic anemia 08/24/2020   Varicosities     SURGICAL HISTORY: Past Surgical History:  Procedure Laterality Date   ABDOMINAL HYSTERECTOMY     CARDIAC CATHETERIZATION N/A 11/08/2015   Procedure: Left Heart Cath and Coronary Angiography;  Surgeon: Lucendia Rusk, MD;  Location: [facility] INVASIVE CV LAB;  Service: Cardiovascular;  Laterality: N/A;   COLONOSCOPY  10/13/11   small adenoma, anal condyloma   COLONOSCOPY  10/13/2011   Procedure: COLONOSCOPY;  Surgeon: Kenney Peacemaker, MD;  Location: [facility] ENDOSCOPY;  Service: Endoscopy;  Laterality: N/A;  COLONOSCOPY N/A 09/14/2014   Procedure: COLONOSCOPY;  Surgeon: Evangeline Hilts, MD;  Location: [facility] ENDOSCOPY;  Service: Endoscopy;  Laterality: N/A;   COLONOSCOPY WITH PROPOFOL  N/A 06/05/2020   Procedure: COLONOSCOPY WITH PROPOFOL ;  Surgeon: Evangeline Hilts, MD;  Location: [facility] ENDOSCOPY;  Service: Endoscopy;  Laterality: N/A;   ENTEROSCOPY N/A 10/31/2020   Procedure: ENTEROSCOPY;  Surgeon: Felecia Hopper, MD;  Location: [facility] ENDOSCOPY;  Service: Gastroenterology;  Laterality:  N/A;   ESOPHAGOGASTRODUODENOSCOPY  10/13/11   small hiatal hernia   ESOPHAGOGASTRODUODENOSCOPY  10/13/2011   Procedure: ESOPHAGOGASTRODUODENOSCOPY (EGD);  Surgeon: Kenney Peacemaker, MD;  Location: [facility] ENDOSCOPY;  Service: Endoscopy;  Laterality: N/A;   ESOPHAGOGASTRODUODENOSCOPY N/A 09/14/2014   Procedure: ESOPHAGOGASTRODUODENOSCOPY (EGD);  Surgeon: Evangeline Hilts, MD;  Location: [facility] ENDOSCOPY;  Service: Endoscopy;  Laterality: N/A;   ESOPHAGOGASTRODUODENOSCOPY (EGD) WITH PROPOFOL  N/A 06/05/2020   Procedure: ESOPHAGOGASTRODUODENOSCOPY (EGD) WITH PROPOFOL ;  Surgeon: Evangeline Hilts, MD;  Location: [facility] ENDOSCOPY;  Service: Endoscopy;  Laterality: N/A;   GIVENS CAPSULE STUDY N/A 09/14/2014   Procedure: GIVENS CAPSULE STUDY;  Surgeon: Evangeline Hilts, MD;  Location: [facility] ENDOSCOPY;  Service: Endoscopy;  Laterality: N/A;   GIVENS CAPSULE STUDY N/A 08/26/2020   Procedure: GIVENS CAPSULE STUDY;  Surgeon: Celedonio Coil, MD;  Location: [facility] ENDOSCOPY;  Service: Endoscopy;  Laterality: N/A;   HOT HEMOSTASIS N/A 10/31/2020   Procedure: HOT HEMOSTASIS (ARGON PLASMA COAGULATION/BICAP);  Surgeon: Felecia Hopper, MD;  Location: [facility] ENDOSCOPY;  Service: Gastroenterology;  Laterality: N/A;   PACEMAKER IMPLANT N/A 02/05/2018   Procedure: PACEMAKER IMPLANT;  Surgeon: Jolly Needle, MD;  Location: [facility] INVASIVE CV LAB;  Service: Cardiovascular;  Laterality: N/A;   POLYPECTOMY  06/05/2020   Procedure: POLYPECTOMY;  Surgeon: Evangeline Hilts, MD;  Location: [facility] ENDOSCOPY;  Service: Endoscopy;;    SOCIAL HISTORY: Social History   Socioeconomic History   Marital status: Married    Spouse name: Not on file   Number of children: 6   Years of education: Not on file   Highest education level: Not on file  Occupational History   Occupation: retired  Tobacco Use   Smoking status: Every Day    Current packs/day: 1.00    Average packs/day: 1 pack/day for 60.0 years (60.0 ttl pk-yrs)    Types: Cigarettes   Smokeless tobacco: Never    Tobacco comments:    Sometimes less.  Vaping Use   Vaping status: Never Used  Substance and Sexual Activity   Alcohol  use: Yes    Alcohol /week: 10.0 standard drinks of alcohol     Types: 10 Glasses of wine per week    Comment: wine sometimes.   Drug use: No   Sexual activity: Yes    Partners: Male    Birth control/protection: Surgical    Comment: pt. given condoms 08/23/20  Other Topics Concern   Not on file  Social History Narrative   Not on file   Social Drivers of Health   Financial Resource Strain: High Risk (03/26/2023)   Overall Financial Resource Strain (CARDIA)    Difficulty of Paying Living Expenses: Hard  Food Insecurity: No Food Insecurity (03/26/2023)   Hunger Vital Sign    Worried About Running Out of Food in the Last Year: Never true    Ran Out of Food in the Last Year: Never true  Transportation Needs: Unmet Transportation Needs (03/26/2023)   PRAPARE - Transportation    Lack of Transportation (Medical): Yes    Lack of Transportation (Non-Medical): Yes  Physical Activity: Inactive (03/26/2023)   Exercise Vital  Sign    Days of Exercise per Week: 1 day    Minutes of Exercise per Session: 0 min  Stress: No Stress Concern Present (03/26/2023)   [employer] of Occupational Health - Occupational Stress Questionnaire    Feeling of Stress : Only a little  Social Connections: Socially Integrated (03/26/2023)   Social Connection and Isolation Panel [NHANES]    Frequency of Communication with Friends and Family: More than three times a week    Frequency of Social Gatherings with Friends and Family: More than three times a week    Attends Religious Services: More than 4 times per year    Active Member of [employer] or Organizations: Yes    Attends Banker Meetings: 1 to 4 times per year    Marital Status: Married  Catering manager Violence: Not At Risk (03/26/2023)   Humiliation, Afraid, Rape, and Kick questionnaire    Fear of Current or Ex-Partner: No     Emotionally Abused: No    Physically Abused: No    Sexually Abused: No    FAMILY HISTORY: Family History  Problem Relation Age of Onset   Diabetes Mother    Ovarian cancer Sister    Colon cancer Sister     ALLERGIES:  is allergic to penicillins, penicillin g, and avelox  [moxifloxacin  hcl in nacl].  MEDICATIONS:  Current Outpatient Medications  Medication Sig Dispense Refill   acyclovir  (ZOVIRAX ) 400 MG tablet Take 1 tablet (400 mg total) by mouth daily as needed (for flare ups). 30 tablet 3   albuterol  (PROAIR  HFA) 108 (90 Base) MCG/ACT inhaler INHALE 2 PUFFS BY MOUTH EVERY 4 HOURS IF NEEDED FOR WHEEZING OR SHORTNESS OF BREATH (Patient taking differently: Inhale 2 puffs into the lungs every 4 (four) hours as needed for wheezing or shortness of breath.) 8.5 g 0   Ascorbic Acid  (VITAMIN C) 1000 MG tablet Take 1,000 mg by mouth daily.     atorvastatin  (LIPITOR) 40 MG tablet take 1 tablet by mouth once daily AT 6 PM (Patient taking differently: Take 40 mg by mouth every evening.) 90 tablet 0   BIKTARVY  50-200-25 MG TABS tablet TAKE 1 TABLET BY MOUTH EVERY DAY (Patient taking differently: Take 1 tablet by mouth daily.) 90 tablet 3   cholecalciferol  (VITAMIN D3) 25 MCG (1000 UNIT) tablet Take 1,000 Units by mouth daily.     cyanocobalamin  (VITAMIN B12) 1000 MCG tablet TAKE 1 TABLET BY MOUTH EVERY DAY 90 tablet 1   dextromethorphan  (DELSYM ) 30 MG/5ML liquid Take 30 mg by mouth as needed for cough.      diclofenac  (VOLTAREN ) 75 MG EC tablet Take 1 tablet (75 mg total) by mouth 2 (two) times daily as needed. Do not take until finished with steroid taper 60 tablet 2   Ensure (ENSURE) Take 237 mLs by mouth 2 (two) times daily between meals. 240 mL 3   escitalopram  (LEXAPRO ) 10 MG tablet Take 1 tablet (10 mg total) by mouth daily as needed (anxiety). 30 tablet 3   famotidine  (PEPCID ) 20 MG tablet Take 20 mg by mouth 2 (two) times daily.     Flaxseed, Linseed, (FLAX SEED OIL) 1000 MG CAPS Take 1,000  mg by mouth daily.     folic acid  (FOLVITE ) 1 MG tablet TAKE 1 TABLET BY MOUTH EVERY DAY 90 tablet 1   gabapentin  (NEURONTIN ) 300 MG capsule Take 300 mg by mouth 2 (two) times daily.     hydrochlorothiazide  (HYDRODIURIL ) 25 MG tablet Take 25 mg by mouth  daily.      HYDROcodone -acetaminophen  (NORCO/VICODIN) 5-325 MG tablet Take 1 tablet by mouth 2 (two) times daily as needed.     isosorbide  mononitrate (IMDUR ) 60 MG 24 hr tablet Take 1 tablet (60 mg total) by mouth daily. 90 tablet 1   lisinopril  (ZESTRIL ) 40 MG tablet Take 40 mg by mouth daily.     loratadine  (CLARITIN ) 10 MG tablet Take 10 mg by mouth daily.     meclizine  (ANTIVERT ) 25 MG tablet Take 25 mg by mouth as needed for dizziness.      methocarbamol  (ROBAXIN ) 500 MG tablet Take 1 tablet (500 mg total) by mouth 2 (two) times daily as needed. 20 tablet 1   methocarbamol  (ROBAXIN -750) 750 MG tablet Take 1 tablet (750 mg total) by mouth 2 (two) times daily as needed for muscle spasms. 20 tablet 1   metoprolol  succinate (TOPROL -XL) 50 MG 24 hr tablet TAKE 1 TABLET BY MOUTH AT BEDTIME 90 tablet 0   Omega-3 Fatty Acids (FISH OIL) 1000 MG CPDR Take 1,000 mg by mouth daily.     ondansetron  (ZOFRAN -ODT) 4 MG disintegrating tablet 1 tablet on the tongue and allow to dissolve     pantoprazole  (PROTONIX ) 40 MG tablet Take 1 tablet (40 mg total) by mouth daily. 90 tablet 0   predniSONE  (STERAPRED UNI-PAK 21 TAB) 5 MG (21) TBPK tablet Take as directed 21 tablet 0   spironolactone  (ALDACTONE ) 25 MG tablet TAKE 1 TABLET BY MOUTH EVERY DAY (Patient taking differently: Take 25 mg by mouth daily.) 30 tablet 0   SYMBICORT  160-4.5 MCG/ACT inhaler Inhale 2 puffs into the lungs 2 (two) times daily.     traMADol  (ULTRAM ) 50 MG tablet Take 1 tablet (50 mg total) by mouth every 6 (six) hours as needed. 60 tablet 0   vitamin A  10000 UNIT capsule Take 10,000 Units by mouth daily.     ferrous sulfate  325 (65 FE) MG EC tablet Take 1 tablet (325 mg total) by mouth in  the morning and at bedtime. 180 tablet 0   No current facility-administered medications for this visit.   Facility-Administered Medications Ordered in Other Visits  Medication Dose Route Frequency Provider Last Rate Last Admin   acetaminophen  (TYLENOL ) tablet 650 mg  650 mg Oral Once Patel, Yatin, [facility]       diphenhydrAMINE  (BENADRYL ) capsule 25 mg  25 mg Oral Once Patel, Yatin, [facility]       ferumoxytol  (FERAHEME ) 510 mg in sodium chloride  0.9 % 100 mL IVPB  510 mg Intravenous Once Patel, Yatin, [facility]        REVIEW OF SYSTEMS:   Constitutional: ( - ) fevers, ( - )  chills , ( - ) night sweats Eyes: ( - ) blurriness of vision, ( - ) double vision, ( - ) watery eyes Ears, nose, mouth, throat, and face: ( - ) mucositis, ( - ) sore throat Respiratory: ( - ) cough, ( + ) dyspnea, ( - ) wheezes Cardiovascular: ( - ) palpitation, ( - ) chest discomfort, ( - ) lower extremity swelling Gastrointestinal:  ( - ) nausea, ( - ) heartburn, ( - ) change in bowel habits Skin: ( - ) abnormal skin rashes Lymphatics: ( - ) new lymphadenopathy, ( - ) easy bruising Neurological: ( - ) numbness, ( - ) tingling, ( - ) new weaknesses Behavioral/Psych: ( - ) mood change, ( - ) new changes  All other systems were reviewed with the patient and are negative.  PHYSICAL EXAMINATION:  Vitals:   04/13/24 1004  BP: 96/66  Pulse: 64  Resp: 13  Temp: 97.6 F (36.4 C)  SpO2: 93%   Filed Weights   04/13/24 1004  Weight: 130 lb 8 oz (59.2 kg)    GENERAL: well appearing elderly African American female. alert, no distress and comfortable SKIN: skin color, texture, turgor are normal, no rashes or significant lesions EYES: conjunctiva are pink and non-injected, sclera clear LUNGS: clear to auscultation and percussion with normal breathing effort HEART: regular rate & rhythm and no murmurs and no lower extremity edema Musculoskeletal: no cyanosis of digits and no clubbing  PSYCH: alert & oriented x 3, fluent  speech NEURO: no focal motor/sensory deficits  LABORATORY DATA:  I have reviewed the data as listed    Latest Ref Rng & Units 04/13/2024    9:35 AM 01/30/2024   10:27 AM 10/21/2023    8:32 AM  CBC  WBC 4.0 - 10.5 K/uL 3.5  4.0  3.7   Hemoglobin 12.0 - 15.0 g/dL 16.1  09.6  04.5   Hematocrit 36.0 - 46.0 % 33.4  32.1  36.3   Platelets 150 - 400 K/uL 232  194  187        Latest Ref Rng & Units 01/30/2024   10:27 AM 10/21/2023    8:32 AM 09/16/2023    9:06 AM  CMP  Glucose 70 - 99 mg/dL 99  409  811   BUN 8 - 23 mg/dL 20  19  25    Creatinine 0.44 - 1.00 mg/dL 9.14  7.82  9.56   Sodium 135 - 145 mmol/L 139  139  138   Potassium 3.5 - 5.1 mmol/L 4.0  4.5  3.9   Chloride 98 - 111 mmol/L 103  103  102   CO2 22 - 32 mmol/L 32  32  30   Calcium  8.9 - 10.3 mg/dL 9.1  9.5  9.8   Total Protein 6.5 - 8.1 g/dL 6.9  7.6  7.8   Total Bilirubin 0.0 - 1.2 mg/dL 0.3  0.3  0.6   Alkaline Phos 38 - 126 U/L 64  64  75   AST 15 - 41 U/L 14  14  17    ALT 0 - 44 U/L 8  8  12      RADIOGRAPHIC STUDIES: No results found.  ASSESSMENT & PLAN Nicole Mccormick is a 78 y.o. female with medical history significant for iron deficiency anemia 2/2 to GI bleed who presents for a follow up visit.   # Iron Deficiency Anemia 2/2 to GI Bleed   --last received IV feraheme  510 mg x 2 doses from 12/16/2023-12/23/2023.  --patient follows with Dr. Kimble Pennant at [facility] but plans to establish care with [facility] in [place] due to recent move.  --Labs today stable anemia with Hgb 10.2, MCV 105.7. Iron panel shows no deficiency with ferritin 46, saturation 70%, ferritin 330.  --continue PO ferrous sulfate  325mg  daily. Take with a source of Vitamin C. --No need for IV iron at this time. --RTC in 3 months with repeat labs. Since patient moved to [facility], we will transfer her care to hematology group at [facility].   # Arthritis Pain -- Most recently prescribed tramadol  50 mg every 6 hours PRN by Dr. Rosaline Coma. I will  give 7 day refill today as patient plans to establish care with new PCP later this week.  #Macrocytosis--improving --most likely secondary to alcohol  consumption. --no  evidence of vitamin B12 or folate deficiencies.  --patient not currently on any medications known to cause macrocytosis.   No orders of the defined types were placed in this encounter.  All questions were answered. The patient knows to call the clinic with any problems, questions or concerns.   I have spent a total of 30 minutes minutes of face-to-face and non-face-to-face time, preparing to see the patient, performing a medically appropriate examination, counseling and educating the patient, referring and communicating with other health care professionals, documenting clinical information in the electronic health record, independently interpreting results and communicating results to the patient, and care coordination.   Wyline Hearing PA-C Dept of Hematology and Oncology [facility] Phone: [phone]   04/13/2024 10:18 AM

## 2024-04-15 ENCOUNTER — Other Ambulatory Visit: Payer: Self-pay | Admitting: Infectious Diseases

## 2024-04-15 DIAGNOSIS — B20 Human immunodeficiency virus [HIV] disease: Secondary | ICD-10-CM

## 2024-04-15 NOTE — Addendum Note (Signed)
 Addended by: Manfred Seed on: 04/15/2024 01:55 PM   Modules accepted: Orders

## 2024-04-16 ENCOUNTER — Telehealth: Payer: Self-pay

## 2024-04-16 LAB — METHYLMALONIC ACID, SERUM: Methylmalonic Acid, Quantitative: 141 nmol/L (ref 0–378)

## 2024-04-16 NOTE — Telephone Encounter (Signed)
-----   Message from Darilyn Edin sent at 04/13/2024 10:03 PM EDT ----- Please notify patient that there is no evidence of iron, b12 or folate deficiency. No need for additional IV iron at this time. ----- Message ----- From: Dannis Dy, Lab In [place] Sent: 04/13/2024   9:49 AM EDT To: Darilyn Edin, PA-C

## 2024-04-16 NOTE — Telephone Encounter (Signed)
 Pt advised and voiced understanding.

## 2024-04-19 ENCOUNTER — Telehealth: Payer: Self-pay | Admitting: Family

## 2024-04-19 NOTE — Telephone Encounter (Signed)
 Per inbasket pt is requesting a TOC to our office as she has relocated to [facility] I have tried to reach out to pt and her daughter who is on her DPR but was unsuccesul was not able to lvm.

## 2024-04-21 ENCOUNTER — Encounter: Admitting: Infectious Diseases

## 2024-05-31 ENCOUNTER — Telehealth: Payer: Self-pay | Admitting: *Deleted

## 2024-05-31 NOTE — Telephone Encounter (Signed)
 Unable to contact patient. Left VM on daughter, Bing Ill [phone] that mom's appointment has been cancelled with Dr. Eben for 06/01/24.

## 2024-06-01 ENCOUNTER — Encounter: Admitting: Infectious Diseases

## 2024-06-02 ENCOUNTER — Encounter: Admitting: Infectious Diseases

## 2024-06-09 ENCOUNTER — Other Ambulatory Visit: Payer: Self-pay | Admitting: Infectious Diseases

## 2024-06-09 DIAGNOSIS — E43 Unspecified severe protein-calorie malnutrition: Secondary | ICD-10-CM

## 2024-06-09 MED ORDER — ENSURE PO LIQD: 237.0000 mL | Freq: Two times a day (BID) | ORAL | 3 refills | Status: AC

## 2024-06-09 NOTE — Telephone Encounter (Signed)
 See note - routing to PCP office

## 2024-06-09 NOTE — Telephone Encounter (Unsigned)
 Copied from CRM [id number]. Topic: Clinical - Medication Refill >> Jun 09, 2024 11:17 AM Vivian Z wrote: Medication: Ensure (ENSURE)  Has the patient contacted their pharmacy? Yes (Agent: If no, request that the patient contact the pharmacy for the refill. If patient does not wish to contact the pharmacy document the reason why and proceed with request.) (Agent: If yes, when and what did the pharmacy advise?)  This is the patient's preferred pharmacy:  [facility] on [address]. Patient stated that's where it is usually sent and the nurse will have the fax number.  Is this the correct pharmacy for this prescription? Yes If no, delete pharmacy and type the correct one.   Has the prescription been filled recently? No  Is the patient out of the medication? Yes  Has the patient been seen for an appointment in the last year OR does the patient have an upcoming appointment? Yes  Can we respond through MyChart? No  Agent: Please be advised that Rx refills may take up to 3 business days. We ask that you follow-up with your pharmacy.

## 2024-06-09 NOTE — Telephone Encounter (Signed)
 Contacted [facility]  Rx to be faxed to [phone] Atten Gail Edison  CMA reached out to pt and confirmed.   Will have MD sign rx and front office will fax

## 2024-06-09 NOTE — Telephone Encounter (Signed)
 Attempted to reach out to patient to verify pharmacy address: no answer: no voicemail set up therefore unable to leave message. Pharmacy [facility] listed was not able to be found.  Copied from CRM [id number]. Topic: Clinical - Medication Refill >> Jun 09, 2024 11:17 AM Vivian Z wrote: Medication: Ensure (ENSURE)   Has the patient contacted their pharmacy? Yes (Agent: If no, request that the patient contact the pharmacy for the refill. If patient does not wish to contact the pharmacy document the reason why and proceed with request.) (Agent: If yes, when and what did the pharmacy advise?)   This is the patient's preferred pharmacy:  [facility] on [address]. Patient stated that's where it is usually sent and the nurse will have the fax number.   Is this the correct pharmacy for this prescription? Yes If no, delete pharmacy and type the correct one.    Has the prescription been filled recently? No   Is the patient out of the medication? Yes   Has the patient been seen for an appointment in the last year OR does the patient have an upcoming appointment? Yes   Can we respond through MyChart? No   Agent: Please be advised that Rx refills may take up to 3 business days. We ask that you follow-up with your pharmacy.

## 2024-06-10 ENCOUNTER — Telehealth: Payer: Self-pay | Admitting: *Deleted

## 2024-06-10 NOTE — Telephone Encounter (Signed)
 Spoke with [facility], CMA who faxed over the order for the Ensure for the patient.  Hme received confirmation that the order was received.  Call to patient to inform her that the prescription has been sent. Patient stated that she has gotten everything she needed.  Copied from CRM [id number]. Topic: Clinical - Medication Question >> Jun 10, 2024 10:57 AM Mercer PEDLAR wrote: Reason for CRM: Patient is calling to check status of prescription for Ensure (ENSURE). I informed her that it was sent yesterday 06/09/24 but it does not say where it was sent to. Please contact patient to let her know where the prescription was sent.   Callback: [phone]

## 2024-06-14 NOTE — Telephone Encounter (Signed)
 Please refer to message below.   Copied from CRM [id number]. Topic: Medical Record Request - Provider/Facility Request >> Jun 14, 2024 12:01 PM Fredrica W wrote: Reason for CRM: Received a call from [facility]. Asking if patient is current patient for infectious disease. If so, they would like a copy of CD4 and viral loads emailed to: [email or web address]. Thank You

## 2024-06-16 NOTE — Telephone Encounter (Signed)
 Call to Avander Heyden at [facility].  Message was left on Intake VM as I was unable to contact [facility] that the Clinics are unable to e-mail CD4 Counts and Viral Loads on our patients due to its sensitivity.  Will need to have a signed consent from the patient as well.  Message was left for someone from the Agency to call the Clinics.

## 2024-06-21 ENCOUNTER — Telehealth: Payer: Self-pay | Admitting: Infectious Diseases

## 2024-06-21 NOTE — Telephone Encounter (Signed)
 Please refer to message below.  Attempted to contact patient to schedule future appointment with Dr. Eben. No answer and voicemail has not been setup.    Copied from CRM [id number]. Topic: Appointments - Scheduling Inquiry for Clinic >> Jun 17, 2024 10:21 AM Laurier BROCKS wrote: Reason for CRM: Patient would like a call back at [phone] to get scheduled to see Dr. Derinda

## 2024-06-25 ENCOUNTER — Encounter: Payer: Self-pay | Admitting: Hematology and Oncology

## 2024-07-12 ENCOUNTER — Other Ambulatory Visit: Payer: Self-pay | Admitting: Infectious Diseases

## 2024-07-12 DIAGNOSIS — Z113 Encounter for screening for infections with a predominantly sexual mode of transmission: Secondary | ICD-10-CM

## 2024-07-12 DIAGNOSIS — B20 Human immunodeficiency virus [HIV] disease: Secondary | ICD-10-CM

## 2024-07-12 DIAGNOSIS — Z79899 Other long term (current) drug therapy: Secondary | ICD-10-CM

## 2024-07-13 ENCOUNTER — Other Ambulatory Visit: Payer: Self-pay | Admitting: Family

## 2024-07-13 ENCOUNTER — Other Ambulatory Visit

## 2024-07-13 DIAGNOSIS — D649 Anemia, unspecified: Secondary | ICD-10-CM

## 2024-07-14 ENCOUNTER — Other Ambulatory Visit

## 2024-07-14 ENCOUNTER — Inpatient Hospital Stay: Admitting: Family

## 2024-07-14 ENCOUNTER — Inpatient Hospital Stay: Attending: Hematology and Oncology

## 2024-07-15 ENCOUNTER — Other Ambulatory Visit

## 2024-07-27 ENCOUNTER — Encounter: Admitting: Infectious Diseases

## 2024-07-28 ENCOUNTER — Other Ambulatory Visit (HOSPITAL_COMMUNITY)
Admission: RE | Admit: 2024-07-28 | Discharge: 2024-07-28 | Disposition: A | Discharge status: Home / Self Care | Source: Ambulatory Visit | Attending: Infectious Diseases | Admitting: Infectious Diseases

## 2024-07-28 ENCOUNTER — Ambulatory Visit: Admitting: Infectious Diseases

## 2024-07-28 VITALS — BP 124/80 | HR 60 | Temp 98.2°F | Ht 60.0 in | Wt 123.8 lb

## 2024-07-28 DIAGNOSIS — B20 Human immunodeficiency virus [HIV] disease: Secondary | ICD-10-CM

## 2024-07-28 DIAGNOSIS — M545 Low back pain, unspecified: Secondary | ICD-10-CM | POA: Diagnosis not present

## 2024-07-28 DIAGNOSIS — R296 Repeated falls: Secondary | ICD-10-CM | POA: Diagnosis not present

## 2024-07-28 DIAGNOSIS — R251 Tremor, unspecified: Secondary | ICD-10-CM

## 2024-07-28 DIAGNOSIS — Z23 Encounter for immunization: Secondary | ICD-10-CM

## 2024-07-28 DIAGNOSIS — I10 Essential (primary) hypertension: Secondary | ICD-10-CM | POA: Diagnosis not present

## 2024-07-28 DIAGNOSIS — D62 Acute posthemorrhagic anemia: Secondary | ICD-10-CM

## 2024-07-28 DIAGNOSIS — F419 Anxiety disorder, unspecified: Secondary | ICD-10-CM

## 2024-07-28 DIAGNOSIS — Z79899 Other long term (current) drug therapy: Secondary | ICD-10-CM | POA: Diagnosis not present

## 2024-07-28 DIAGNOSIS — Z113 Encounter for screening for infections with a predominantly sexual mode of transmission: Secondary | ICD-10-CM | POA: Insufficient documentation

## 2024-07-28 DIAGNOSIS — Z950 Presence of cardiac pacemaker: Secondary | ICD-10-CM

## 2024-07-28 DIAGNOSIS — I350 Nonrheumatic aortic (valve) stenosis: Secondary | ICD-10-CM

## 2024-07-28 DIAGNOSIS — G8929 Other chronic pain: Secondary | ICD-10-CM

## 2024-07-28 DIAGNOSIS — F330 Major depressive disorder, recurrent, mild: Secondary | ICD-10-CM

## 2024-07-28 DIAGNOSIS — E785 Hyperlipidemia, unspecified: Secondary | ICD-10-CM

## 2024-07-28 MED ORDER — ESCITALOPRAM OXALATE 10 MG PO TABS: 10.0000 mg | ORAL_TABLET | Freq: Every day | ORAL | 3 refills | Status: AC | PRN

## 2024-07-28 NOTE — Assessment & Plan Note (Signed)
 She appears to be doing well Will conitune her current art (biktarvy ) Labs today PCV 20 today.  Get flu and covid vax when available.  Rtc in 6 months

## 2024-07-28 NOTE — Assessment & Plan Note (Signed)
 Will have her eval by neuro.

## 2024-07-28 NOTE — Assessment & Plan Note (Signed)
 Will get her in with neuro.

## 2024-07-28 NOTE — Assessment & Plan Note (Signed)
 Continue lipitor Check LFTs today.

## 2024-07-28 NOTE — Assessment & Plan Note (Signed)
 She has f/u with GI and heme.  Will repeat her labs today.

## 2024-07-28 NOTE — Assessment & Plan Note (Signed)
 Attributes to stress of finding clinic Will repeat

## 2024-07-28 NOTE — Assessment & Plan Note (Signed)
 Will get her an TTE to f/u for her falls.

## 2024-07-28 NOTE — Assessment & Plan Note (Signed)
 Does not want surgery Prn analgesia.

## 2024-07-28 NOTE — Progress Notes (Unsigned)
 Subjective:    Patient ID: Nicole Mccormick, female  DOB: 12/21/1945, 78 y.o.        MRN: [id number]   HPI 78 yo F with hx of HTN and HIV+. She is also long term smoker.  She had episodes of bradycardia, and had pacer placed 02-05-18.  Was changed to biktarvy  06-2018 from tivicay -descovey. No problems with her ART.  Has still been having joint pains. Mostly in her lower back. 05-2021 1. Right convex scoliosis. 2. Spondylosis and facet hypertrophy at L4-5 and L5-S1. Still having lower back pain. Has been trying tiger balm. Is limiting her ability get things done.  Does not want surgery.   Now living in single story dwelling, in [place].    She was in hospital June 2021 with anemia- she had colonoscopy (4-5 polyps, hemmeroids) and EGD (avm).  She still has GI f/u as well as Heme f/u for iron infusions.    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    Had transfusion of 2 u prbc last week due to anemia (was 7.3 then down to 6.8). She is also getting iron infusion next week.  She has resumed po iron (gummies) vit c, calcium .  Was feeling weak. More energy now, and her color came back.  She is driving without difficulty.    Had pacer check 4-9. Told it will be good another 5 years.    Excited about turning 78. Feels well except can't get around as well as she would like.  She trips and falls occasionally. Will trip on anything. Gets weakness in her L leg (not sure if joint pain/arthritis).  Gets depressed sometimes due to this. Her kids are involved. Thinks about her mom, 2 sons (deceased) and remembers people who have been gone a long time which depresses her.  She has medication for anxiety.   She can feel her heart palpitating when her bp gets up.  Denies missed art.    Still smoking.  HIV 1 RNA Quant  Date Value  08/17/2021 Not Detected Copies/mL  08/23/2020 <20 DETECTED copies/mL  07/06/2018 <20 NOT DETECTED copies/mL   HIV-1 RNA Viral Load  (copies/mL)  Date Value  11/13/2022 <20   CD4 T Cell Abs (/uL)  Date Value  11/13/2022 422  08/23/2020 395 (L)  06/05/2020 389 (L)     Health Maintenance  Topic Date Due  . Zoster Vaccines- Shingrix (1 of 2) 06/26/1965  . DEXA SCAN  Never done  . Lung Cancer Screening  12/30/2019  . DTaP/Tdap/Td (2 - Td or Tdap) 02/27/2023  . COVID-19 Vaccine (4 - 2024-25 season) 08/10/2023  . Medicare Annual Wellness (AWV)  03/25/2024  . INFLUENZA VACCINE  07/09/2024  . Colonoscopy  06/06/2027  . Pneumococcal Vaccine: 50+ Years  Completed  . HPV VACCINES  Aged Out  . Meningococcal B Vaccine  Aged Out  . Hepatitis B Vaccines 19-59 Average Risk  Discontinued  . Hepatitis C Screening  Discontinued      Review of Systems  Constitutional:  Negative for chills, fever and weight loss.  Cardiovascular:  Negative for chest pain.  Gastrointestinal:  Positive for abdominal pain. Negative for blood in stool and constipation.  Genitourinary:  Negative for dysuria.  Neurological:  Positive for dizziness (with falls, needs help getting up.), tremors and headaches (relieved with pain pill. gaba does not help.).    Please see HPI. All other systems reviewed and negative.     Objective:  Physical Exam Vitals  reviewed.  Constitutional:      General: She is not in acute distress.    Appearance: Normal appearance. She is not ill-appearing or toxic-appearing.  HENT:     Mouth/Throat:     Mouth: Mucous membranes are moist.     Pharynx: No oropharyngeal exudate.  Eyes:     Extraocular Movements: Extraocular movements intact.     Pupils: Pupils are equal, round, and reactive to light.  Cardiovascular:     Rate and Rhythm: Normal rate and regular rhythm.     Heart sounds: Murmur heard.  Pulmonary:     Effort: Pulmonary effort is normal.     Breath sounds: Normal breath sounds.  Abdominal:     General: Bowel sounds are normal. There is no distension.     Palpations: Abdomen is soft.      Tenderness: There is no abdominal tenderness.  Musculoskeletal:        General: Normal range of motion.     Right lower leg: No edema.     Left lower leg: No edema.  Neurological:     General: No focal deficit present.     Mental Status: She is alert and oriented to person, place, and time.     Motor: No weakness.  Psychiatric:        Mood and Affect: Mood normal.           Assessment & Plan:

## 2024-07-28 NOTE — Assessment & Plan Note (Signed)
 Will resume her lexapro .  Rtc 3 months

## 2024-07-28 NOTE — Assessment & Plan Note (Signed)
 Phone check next month.

## 2024-07-29 LAB — T-HELPER CELLS (CD4) COUNT (NOT AT ARMC)
CD4 % Helper T Cell: 26 % — ABNORMAL LOW (ref 33–65)
CD4 T Cell Abs: 342 /uL — ABNORMAL LOW (ref 400–1790)

## 2024-07-29 LAB — URINE CYTOLOGY ANCILLARY ONLY
Chlamydia: NEGATIVE
Comment: NEGATIVE
Comment: NORMAL
Neisseria Gonorrhea: NEGATIVE

## 2024-07-30 ENCOUNTER — Encounter: Payer: Self-pay | Admitting: Hematology and Oncology

## 2024-07-30 LAB — CBC
Hematocrit: 42.9 % (ref 34.0–46.6)
Hemoglobin: 14.1 g/dL (ref 11.1–15.9)
MCH: 34.3 pg — ABNORMAL HIGH (ref 26.6–33.0)
MCHC: 32.9 g/dL (ref 31.5–35.7)
MCV: 104 fL — ABNORMAL HIGH (ref 79–97)
Platelets: 195 x10E3/uL (ref 150–450)
RBC: 4.11 x10E6/uL (ref 3.77–5.28)
RDW: 15.4 % (ref 11.7–15.4)
WBC: 4.6 x10E3/uL (ref 3.4–10.8)

## 2024-07-30 LAB — COMPREHENSIVE METABOLIC PANEL WITH GFR
ALT: 10 IU/L (ref 0–32)
AST: 14 IU/L (ref 0–40)
Albumin: 4.3 g/dL (ref 3.8–4.8)
Alkaline Phosphatase: 77 IU/L (ref 44–121)
BUN/Creatinine Ratio: 22 (ref 12–28)
BUN: 19 mg/dL (ref 8–27)
Bilirubin Total: 0.4 mg/dL (ref 0.0–1.2)
CO2: 26 mmol/L (ref 20–29)
Calcium: 9.5 mg/dL (ref 8.7–10.3)
Chloride: 98 mmol/L (ref 96–106)
Creatinine, Ser: 0.87 mg/dL (ref 0.57–1.00)
Globulin, Total: 3.1 g/dL (ref 1.5–4.5)
Glucose: 90 mg/dL (ref 70–99)
Potassium: 3.5 mmol/L (ref 3.5–5.2)
Sodium: 138 mmol/L (ref 134–144)
Total Protein: 7.4 g/dL (ref 6.0–8.5)
eGFR: 67 mL/min/1.73 (ref >59)

## 2024-07-30 LAB — LIPID PANEL
Chol/HDL Ratio: 2.4 ratio (ref 0.0–4.4)
Cholesterol, Total: 159 mg/dL (ref 100–199)
HDL: 66 mg/dL (ref >39)
LDL Chol Calc (NIH): 78 mg/dL (ref 0–99)
Triglycerides: 79 mg/dL (ref 0–149)
VLDL Cholesterol Cal: 15 mg/dL (ref 5–40)

## 2024-07-30 LAB — HIV-1 RNA QUANT-NO REFLEX-BLD: HIV-1 RNA Viral Load: <20 {copies}/mL

## 2024-07-30 LAB — RPR: RPR Ser Ql: NONREACTIVE

## 2024-08-02 ENCOUNTER — Ambulatory Visit (HOSPITAL_BASED_OUTPATIENT_CLINIC_OR_DEPARTMENT_OTHER): Attending: Infectious Diseases

## 2024-08-12 ENCOUNTER — Other Ambulatory Visit (HOSPITAL_COMMUNITY)

## 2024-08-16 ENCOUNTER — Inpatient Hospital Stay

## 2024-08-16 ENCOUNTER — Inpatient Hospital Stay: Admitting: Family

## 2024-08-17 ENCOUNTER — Other Ambulatory Visit: Payer: Self-pay | Admitting: Infectious Diseases

## 2024-08-17 DIAGNOSIS — R251 Tremor, unspecified: Secondary | ICD-10-CM

## 2024-08-17 DIAGNOSIS — Z79899 Other long term (current) drug therapy: Secondary | ICD-10-CM

## 2024-08-17 DIAGNOSIS — Z113 Encounter for screening for infections with a predominantly sexual mode of transmission: Secondary | ICD-10-CM

## 2024-08-17 DIAGNOSIS — I10 Essential (primary) hypertension: Secondary | ICD-10-CM

## 2024-08-17 DIAGNOSIS — I350 Nonrheumatic aortic (valve) stenosis: Secondary | ICD-10-CM

## 2024-08-17 DIAGNOSIS — D62 Acute posthemorrhagic anemia: Secondary | ICD-10-CM

## 2024-08-17 DIAGNOSIS — B20 Human immunodeficiency virus [HIV] disease: Secondary | ICD-10-CM

## 2024-08-17 DIAGNOSIS — E785 Hyperlipidemia, unspecified: Secondary | ICD-10-CM

## 2024-08-17 DIAGNOSIS — F330 Major depressive disorder, recurrent, mild: Secondary | ICD-10-CM

## 2024-08-17 DIAGNOSIS — Z23 Encounter for immunization: Secondary | ICD-10-CM

## 2024-08-17 DIAGNOSIS — F419 Anxiety disorder, unspecified: Secondary | ICD-10-CM

## 2024-08-17 DIAGNOSIS — Z950 Presence of cardiac pacemaker: Secondary | ICD-10-CM

## 2024-08-17 DIAGNOSIS — G8929 Other chronic pain: Secondary | ICD-10-CM

## 2024-08-17 DIAGNOSIS — R296 Repeated falls: Secondary | ICD-10-CM

## 2024-08-31 ENCOUNTER — Encounter: Payer: Self-pay | Admitting: *Deleted

## 2024-08-31 ENCOUNTER — Encounter: Payer: Self-pay | Admitting: Neurology

## 2024-09-01 ENCOUNTER — Inpatient Hospital Stay: Admitting: Family

## 2024-09-01 ENCOUNTER — Inpatient Hospital Stay: Attending: Hematology and Oncology

## 2024-09-02 ENCOUNTER — Ambulatory Visit (HOSPITAL_BASED_OUTPATIENT_CLINIC_OR_DEPARTMENT_OTHER): Attending: Infectious Diseases

## 2024-09-29 ENCOUNTER — Ambulatory Visit (HOSPITAL_BASED_OUTPATIENT_CLINIC_OR_DEPARTMENT_OTHER): Attending: Infectious Diseases

## 2024-10-05 ENCOUNTER — Telehealth: Payer: Self-pay | Admitting: *Deleted

## 2024-10-05 NOTE — Telephone Encounter (Signed)
 Will forward to Dr. Eben.              Copied from CRM [id number]. Topic: Clinical - Medical Advice >> Oct 05, 2024  2:28 PM Farrel B wrote: Reason for CRM: [id number], Ms. Dorothy or Ms. Arland is requesting a call back in reference to an echo dr. Eben order for the patient she is needing to know if the Echo is a bubble study or not. Please call to advise at the number listed above

## 2024-10-07 ENCOUNTER — Encounter: Payer: Self-pay | Admitting: Emergency Medicine

## 2024-10-07 NOTE — Telephone Encounter (Signed)
 Call to Echo message left for Nicole Mccormick or Nicole Mccormick that the Echo is not a Bubble Study.

## 2024-10-11 ENCOUNTER — Ambulatory Visit (HOSPITAL_COMMUNITY): Admission: RE | Admit: 2024-10-11 | Source: Ambulatory Visit

## 2024-10-18 ENCOUNTER — Telehealth: Payer: Self-pay | Admitting: *Deleted

## 2024-10-18 NOTE — Telephone Encounter (Signed)
 Gladys gave Chilon B the hard copy rx for ensure.

## 2024-10-18 NOTE — Telephone Encounter (Signed)
 Copied from CRM [id number]. Topic: Clinical - Prescription Issue >> Oct 18, 2024  1:29 PM Chiquita SQUIBB wrote: Reason for CRM: Patient is calling in stating that [facility] [address] needs a prescription for her insure from Dr. Eben. They are requesting it be faxed to the facility.

## 2024-10-21 ENCOUNTER — Encounter: Payer: Self-pay | Admitting: Neurology

## 2024-10-21 NOTE — Telephone Encounter (Signed)
 Dr Eben can you write a rx for Ensure; Chilon stated she needs a hard copy/ Thanks

## 2024-10-21 NOTE — Telephone Encounter (Signed)
Thanks Chilon! 

## 2024-11-09 ENCOUNTER — Telehealth: Payer: Self-pay | Admitting: *Deleted

## 2024-11-09 NOTE — Telephone Encounter (Signed)
 Call from [facility] to clarify Ensure rx. Stated pt has received ensure more than once a day (she was unable to read rx clearly). Informed her,Rx states Ensue 1 can TID (3 times a day)

## 2024-11-18 NOTE — Progress Notes (Deleted)
 Initial neurology clinic note  Reason for Evaluation: Consultation requested by Eben Reyes BROCKS, MD for an opinion regarding tremor and falls. My final recommendations will be communicated back to the requesting physician by way of shared medical record or letter to requesting physician via [place]  mail.  HPI: This is [name], a 78 y.o. ***-handed female with a medical history of HTN, HLD, CAD, sick sinus syndrome s/p PPM, aortic stenosis, iron deficiency anemia, HIV, OA, anxiety, current smoker*** who presents to neurology clinic with the chief complaint of ***. The patient is accompanied by ***.  *** Tremor and falls  Trips and falls occasionally Feels like her left leg is weak Joint and lower back pain Uses tiger balm Does not want surgery  Tremor was seen on exam by PCP  Seizures listed on medical history?  The patient has not*** had similar episodes of symptoms in the past. ***  Muscle bulk loss? *** Muscle pain? ***  Cramps/Twitching? *** Suggestion of myotonia/difficulty relaxing after contraction? ***  Fatigable weakness?*** Does strength improve after brief exercise?***  Able to brush hair/teeth without difficulty? *** Able to button shirts/use zips? *** Clumsiness/dropping grasped objects?*** Can you arise from squatted position easily? *** Able to get out of chair without using arms? *** Able to walk up steps easily? *** Use an assistive device to walk? *** Significant imbalance with walking? *** Falls?*** Any change in urine color, especially after exertion/physical activity? ***  The patient denies*** symptoms suggestive of oculobulbar weakness including diplopia, ptosis, dysphagia, poor saliva control, dysarthria/dysphonia, impaired mastication, facial weakness/droop.  There are no*** neuromuscular respiratory weakness symptoms, particularly orthopnea>dyspnea.   Pseudobulbar affect is absent***.  The patient does not*** report symptoms referable to  autonomic dysfunction including impaired sweating, heat or cold intolerance, excessive mucosal dryness, gastroparetic early satiety, postprandial abdominal bloating, constipation, bowel or bladder dyscontrol, erectile dysfunction*** or syncope/presyncope/orthostatic intolerance.  There are no*** complaints relating to other symptoms of small fiber modalities including paresthesia/pain.  The patient has not *** noticed any recent skin rashes nor does he*** report any constitutional symptoms like fever, night sweats, anorexia or unintentional weight loss.  EtOH use: ***  Restrictive diet? *** Family history of neuropathy/myopathy/NM disease?***  Previous labs, electrodiagnostics, and neuroimaging are summarized below, but pertinent findings include***  Any biopsy done? *** Current medications being tried for the patient's symptoms include ***  Prior medications that have been tried: ***   MEDICATIONS:  Outpatient Encounter Medications as of 12/01/2024  Medication Sig Note   acyclovir  (ZOVIRAX ) 400 MG tablet Take 1 tablet (400 mg total) by mouth daily as needed (for flare ups).    albuterol  (PROAIR  HFA) 108 (90 Base) MCG/ACT inhaler INHALE 2 PUFFS BY MOUTH EVERY 4 HOURS IF NEEDED FOR WHEEZING OR SHORTNESS OF BREATH (Patient taking differently: Inhale 2 puffs into the lungs every 4 (four) hours as needed for wheezing or shortness of breath.)    Ascorbic Acid  (VITAMIN C) 1000 MG tablet Take 1,000 mg by mouth daily.    atorvastatin  (LIPITOR) 40 MG tablet take 1 tablet by mouth once daily AT 6 PM (Patient taking differently: Take 40 mg by mouth every evening.)    BIKTARVY  50-200-25 MG TABS tablet TAKE 1 TABLET BY MOUTH EVERY DAY (Patient taking differently: Take 1 tablet by mouth daily.)    cholecalciferol  (VITAMIN D3) 25 MCG (1000 UNIT) tablet Take 1,000 Units by mouth daily.    cyanocobalamin  (VITAMIN B12) 1000 MCG tablet TAKE 1 TABLET BY MOUTH EVERY DAY  dextromethorphan  (DELSYM ) 30 MG/5ML  liquid Take 30 mg by mouth as needed for cough.     diclofenac  (VOLTAREN ) 75 MG EC tablet Take 1 tablet (75 mg total) by mouth 2 (two) times daily as needed. Do not take until finished with steroid taper    escitalopram  (LEXAPRO ) 10 MG tablet Take 1 tablet (10 mg total) by mouth daily as needed (anxiety).    famotidine  (PEPCID ) 20 MG tablet Take 20 mg by mouth 2 (two) times daily.    ferrous sulfate  325 (65 FE) MG EC tablet Take 1 tablet (325 mg total) by mouth in the morning and at bedtime. 01/30/2024: Takes 1 x a day   Flaxseed, Linseed, (FLAX SEED OIL) 1000 MG CAPS Take 1,000 mg by mouth daily.    folic acid  (FOLVITE ) 1 MG tablet TAKE 1 TABLET BY MOUTH EVERY DAY    gabapentin  (NEURONTIN ) 300 MG capsule Take 300 mg by mouth 2 (two) times daily.    hydrochlorothiazide  (HYDRODIURIL ) 25 MG tablet Take 25 mg by mouth daily.     isosorbide  mononitrate (IMDUR ) 60 MG 24 hr tablet Take 1 tablet (60 mg total) by mouth daily.    lisinopril  (ZESTRIL ) 40 MG tablet Take 40 mg by mouth daily. 04/02/2023: Per PCP   loratadine  (CLARITIN ) 10 MG tablet Take 10 mg by mouth daily.    meclizine  (ANTIVERT ) 25 MG tablet Take 25 mg by mouth as needed for dizziness.     methocarbamol  (ROBAXIN ) 500 MG tablet Take 1 tablet (500 mg total) by mouth 2 (two) times daily as needed.    methocarbamol  (ROBAXIN -750) 750 MG tablet Take 1 tablet (750 mg total) by mouth 2 (two) times daily as needed for muscle spasms.    metoprolol  succinate (TOPROL -XL) 50 MG 24 hr tablet TAKE 1 TABLET BY MOUTH AT BEDTIME    Omega-3 Fatty Acids (FISH OIL) 1000 MG CPDR Take 1,000 mg by mouth daily.    ondansetron  (ZOFRAN -ODT) 4 MG disintegrating tablet 1 tablet on the tongue and allow to dissolve    pantoprazole  (PROTONIX ) 40 MG tablet Take 1 tablet (40 mg total) by mouth daily.    predniSONE  (STERAPRED UNI-PAK 21 TAB) 5 MG (21) TBPK tablet Take as directed    spironolactone  (ALDACTONE ) 25 MG tablet TAKE 1 TABLET BY MOUTH EVERY DAY (Patient taking  differently: Take 25 mg by mouth daily.)    SYMBICORT  160-4.5 MCG/ACT inhaler Inhale 2 puffs into the lungs 2 (two) times daily.    vitamin A  10000 UNIT capsule Take 10,000 Units by mouth daily.    Facility-Administered Encounter Medications as of 12/01/2024  Medication   acetaminophen  (TYLENOL ) tablet 650 mg   diphenhydrAMINE  (BENADRYL ) capsule 25 mg   ferumoxytol  (FERAHEME ) 510 mg in sodium chloride  0.9 % 100 mL IVPB    PAST MEDICAL HISTORY: Past Medical History:  Diagnosis Date   Abnormal Pap smear    Aortic stenosis    TTE 2021 with mean gradient   Arthritis    Asthma    AVM (arteriovenous malformation)    Bradycardia    Complication of anesthesia    they have a hard time bringing me back (11/08/2015)   Coronary artery disease    High grade RCA disease; diagnosed during acute GIB; on medical therapy   Dynamic left ventricular outflow obstruction    GERD (gastroesophageal reflux disease)    previously on aciphex , discontinued november 2012  because patient asymptomatic, and concern about interference with HIV meds.  May try pepcid  in the  future if symptoms return   Heart murmur    Hepatitis C antibody test positive    HIV infection (HCC)    diagnosed before 2008   Hyperlipidemia 09/12/2021   Hypertension    Hypertensive heart disease    Iron deficiency anemia    Ferritin = 2 in november 2012, started on iron supplemenation   Pacemaker    a. St Jude PPM 01/2018.   Seizures (HCC)    Symptomatic anemia 08/24/2020   Varicosities     PAST SURGICAL HISTORY: Past Surgical History:  Procedure Laterality Date   ABDOMINAL HYSTERECTOMY     CARDIAC CATHETERIZATION N/A 11/08/2015   Procedure: Left Heart Cath and Coronary Angiography;  Surgeon: Candyce GORMAN Reek, MD;  Location: [facility] INVASIVE CV LAB;  Service: Cardiovascular;  Laterality: N/A;   COLONOSCOPY  10/13/11   small adenoma, anal condyloma   COLONOSCOPY  10/13/2011   Procedure: COLONOSCOPY;  Surgeon: Lupita FORBES Commander, MD;  Location: [facility] ENDOSCOPY;  Service: Endoscopy;  Laterality: N/A;   COLONOSCOPY N/A 09/14/2014   Procedure: COLONOSCOPY;  Surgeon: Elsie Cree, MD;  Location: [facility] ENDOSCOPY;  Service: Endoscopy;  Laterality: N/A;   COLONOSCOPY WITH PROPOFOL  N/A 06/05/2020   Procedure: COLONOSCOPY WITH PROPOFOL ;  Surgeon: Cree Elsie, MD;  Location: [facility] ENDOSCOPY;  Service: Endoscopy;  Laterality: N/A;   ENTEROSCOPY N/A 10/31/2020   Procedure: ENTEROSCOPY;  Surgeon: Elicia Claw, MD;  Location: [facility] ENDOSCOPY;  Service: Gastroenterology;  Laterality: N/A;   ESOPHAGOGASTRODUODENOSCOPY  10/13/11   small hiatal hernia   ESOPHAGOGASTRODUODENOSCOPY  10/13/2011   Procedure: ESOPHAGOGASTRODUODENOSCOPY (EGD);  Surgeon: Lupita FORBES Commander, MD;  Location: [facility] ENDOSCOPY;  Service: Endoscopy;  Laterality: N/A;   ESOPHAGOGASTRODUODENOSCOPY N/A 09/14/2014   Procedure: ESOPHAGOGASTRODUODENOSCOPY (EGD);  Surgeon: Elsie Cree, MD;  Location: [facility] ENDOSCOPY;  Service: Endoscopy;  Laterality: N/A;   ESOPHAGOGASTRODUODENOSCOPY (EGD) WITH PROPOFOL  N/A 06/05/2020   Procedure: ESOPHAGOGASTRODUODENOSCOPY (EGD) WITH PROPOFOL ;  Surgeon: Cree Elsie, MD;  Location: [facility] ENDOSCOPY;  Service: Endoscopy;  Laterality: N/A;   GIVENS CAPSULE STUDY N/A 09/14/2014   Procedure: GIVENS CAPSULE STUDY;  Surgeon: Elsie Cree, MD;  Location: [facility] ENDOSCOPY;  Service: Endoscopy;  Laterality: N/A;   GIVENS CAPSULE STUDY N/A 08/26/2020   Procedure: GIVENS CAPSULE STUDY;  Surgeon: Lennard Lesta FALCON, MD;  Location: [facility] ENDOSCOPY;  Service: Endoscopy;  Laterality: N/A;   HOT HEMOSTASIS N/A 10/31/2020   Procedure: HOT HEMOSTASIS (ARGON PLASMA COAGULATION/BICAP);  Surgeon: Elicia Claw, MD;  Location: [facility] ENDOSCOPY;  Service: Gastroenterology;  Laterality: N/A;   PACEMAKER IMPLANT N/A 02/05/2018   Procedure: PACEMAKER IMPLANT;  Surgeon: Kelsie Agent, MD;  Location: [facility] INVASIVE CV LAB;  Service: Cardiovascular;  Laterality: N/A;   POLYPECTOMY   06/05/2020   Procedure: POLYPECTOMY;  Surgeon: Cree Elsie, MD;  Location: [facility] ENDOSCOPY;  Service: Endoscopy;;    ALLERGIES: Allergies[1]  FAMILY HISTORY: Family History  Problem Relation Age of Onset   Diabetes Mother    Ovarian cancer Sister    Colon cancer Sister     SOCIAL HISTORY: Social History[2] Social History   Social History Narrative   Not on file     OBJECTIVE: PHYSICAL EXAM: There were no vitals taken for this visit.  General:*** General appearance: Awake and alert. No distress. Cooperative with exam.  Skin: No obvious rash or jaundice. HEENT: Atraumatic. Anicteric. Lungs: Non-labored breathing on room air  Heart: Regular Abdomen: Soft, non tender. Extremities: No edema. No obvious deformity.  Musculoskeletal: No obvious joint swelling. Psych: Affect appropriate.  Neurological: Mental Status: Alert. Speech  fluent. No pseudobulbar affect Cranial Nerves: CNII: No RAPD. Visual fields grossly intact. CNIII, IV, VI: PERRL. No nystagmus. EOMI. CN V: Facial sensation intact bilaterally to fine touch. Masseter clench strong. Jaw jerk***. CN VII: Facial muscles symmetric and strong. No ptosis at rest or after sustained upgaze***. CN VIII: Hearing grossly intact bilaterally. CN IX: No hypophonia. CN X: Palate elevates symmetrically. CN XI: Full strength shoulder shrug bilaterally. CN XII: Tongue protrusion full and midline. No atrophy or fasciculations. No significant dysarthria*** Motor: Tone is ***. *** fasciculations in *** extremities. *** atrophy. No grip or percussive myotonia.***  Individual muscle group testing (MRC grade out of 5):  Movement     Neck flexion ***    Neck extension ***     Right Left   Shoulder abduction *** ***   Shoulder adduction *** ***   Shoulder ext rotation *** ***   Shoulder int rotation *** ***   Elbow flexion *** ***   Elbow extension *** ***   Wrist extension *** ***   Wrist flexion *** ***   Finger  abduction - FDI *** ***   Finger abduction - ADM *** ***   Finger extension *** ***   Finger distal flexion - 2/3 *** ***   Finger distal flexion - 4/5 *** ***   Thumb flexion - FPL *** ***   Thumb abduction - APB *** ***    Hip flexion *** ***   Hip extension *** ***   Hip adduction *** ***   Hip abduction *** ***   Knee extension *** ***   Knee flexion *** ***   Dorsiflexion *** ***   Plantarflexion *** ***   Inversion *** ***   Eversion *** ***   Great toe extension *** ***   Great toe flexion *** ***     Reflexes:  Right Left   Bicep *** ***   Tricep *** ***   BrRad *** ***   Knee *** ***   Ankle *** ***    Pathological Reflexes: Babinski: *** response bilaterally*** Hoffman: *** Troemner: *** Pectoral: *** Palmomental: *** Facial: *** Midline tap: *** Sensation: Pinprick: *** Vibration: *** Temperature: *** Proprioception: *** Coordination: Intact finger-to- nose-finger bilaterally. Romberg negative.*** Gait: Able to rise from chair with arms crossed unassisted. Normal, narrow-based gait. Able to tandem walk. Able to walk on toes and heels.***  Lab and Test Review: Internal labs: 07/28/24: Lipid panel: tChol 159, LDL 78, TG 79 CMP unremarkable  04/13/24: Ferritin: 46 Folate wnl B12: 474 MMA wnl  External labs: 10/10/24: CBC w/ diff: Hb 7.7, MCV 95.6 BMP significant for glucose 129, Cr 1.06 Mg wnl  10/07/24: BNP elevated to 444 TSH wnl Folate wnl B12: 644  Imaging/Procedures: CT head wo contrast (03/08/2014): Skull and Sinuses:No significant abnormality.   Orbits: Right cataract resection.   Brain: No evidence of acute abnormality, such as acute infarction,  hemorrhage, hydrocephalus, or mass lesion/mass effect. There is  chronic small vessel disease with extensive bilateral cerebral white  matter low density, confluent around the frontal horns of the  lateral ventricles. Low attenuating structures in the bilateral  putamina are thus  likely lacunes (rather than dilated perivascular  spaces).   IMPRESSION:  1. No evidence of acute intracranial disease.  2. Chronic small vessel disease.   Lumbar spine xray (06/01/21): FINDINGS: Frontal and lateral views of the lumbar spine demonstrate 5 non-rib-bearing lumbar type vertebral bodies with right convex scoliosis centered at L2/L3. No acute fractures. Prominent spondylosis and facet  hypertrophy at L4-5 and to a lesser extent L5-S1. Diffuse atherosclerosis.   IMPRESSION: 1. Right convex scoliosis. 2. Spondylosis and facet hypertrophy at L4-5 and L5-S1. ***  ASSESSMENT: Nicole Mccormick is a 78 y.o. female who presents for evaluation of ***. *** has a relevant medical history of ***. *** neurological examination is pertinent for ***. Available diagnostic data is significant for ***. This constellation of symptoms and objective data would most likely localize to ***. ***  PLAN: -Blood work: *** ***  -Return to clinic ***  The impression above as well as the plan as outlined below were extensively discussed with the patient (in the company of ***) who voiced understanding. All questions were answered to their satisfaction.  The patient was counseled on pertinent fall precautions per the printed material provided today, and as noted under the Patient Instructions section below.***  When available, results of the above investigations and possible further recommendations will be communicated to the patient via telephone/MyChart. Patient to call office if not contacted after expected testing turnaround time.   Total time spent reviewing records, interview, history/exam, documentation, and coordination of care on day of encounter:  *** min   Thank you for allowing me to participate in patient's care.  If I can answer any additional questions, I would be pleased to do so.  Venetia Potters, MD   CC: Altidor, Jayson, NP [address] [place]  [address]  CC: Referring provider: Eben Reyes BROCKS, MD [address] [place], [address] [place],  [place] [address]     [1]  Allergies Allergen Reactions   Penicillins     REACTION: GOES INTO SHOCK & THEN PASSES OU Has patient had a PCN reaction causing immediate rash, facial/tongue/throat swelling, SOB or lightheadedness with hypotension: NO Has patient had a PCN reaction causing severe rash involving mucus membranes or skin necrosis: NO Has patient had a PCN reaction that required hospitalization NO Has patient had a PCN reaction occurring within the last 10 years: NO If all of the above answers are NO, then may proceed with Cephalosporin use.    Penicillin G Other (See Comments)   Avelox  [Moxifloxacin  Hcl In Nacl] Itching and Rash  [2]  Social History Tobacco Use   Smoking status: Every Day    Current packs/day: 1.00    Average packs/day: 1 pack/day for 60.0 years (60.0 ttl pk-yrs)    Types: Cigarettes   Smokeless tobacco: Never   Tobacco comments:    Sometimes less.  Vaping Use   Vaping status: Never Used  Substance Use Topics   Alcohol  use: Yes    Alcohol /week: 10.0 standard drinks of alcohol     Types: 10 Glasses of wine per week    Comment: wine sometimes.   Drug use: No

## 2024-12-01 ENCOUNTER — Ambulatory Visit: Admitting: Neurology

## 2024-12-06 NOTE — Progress Notes (Unsigned)
 "  Initial neurology clinic note  Reason for Evaluation: Consultation requested by Eben Reyes BROCKS, MD for an opinion regarding tremor and falls. My final recommendations will be communicated back to the requesting physician by way of shared medical record or letter to requesting physician via [place]  mail.  HPI: This is [name], a 79 y.o. ***-handed female with a medical history of HTN, HLD, CAD, sick sinus syndrome s/p PPM, aortic stenosis, iron deficiency anemia, HIV, OA, anxiety, current smoker*** who presents to neurology clinic with the chief complaint of ***. The patient is accompanied by ***.  *** Tremor and falls  Trips and falls occasionally Feels like her left leg is weak Joint and lower back pain Uses tiger balm Does not want surgery  Tremor was seen on exam by PCP  Seizures listed on medical history?  The patient has not*** had similar episodes of symptoms in the past. ***  Muscle bulk loss? *** Muscle pain? ***  Cramps/Twitching? *** Suggestion of myotonia/difficulty relaxing after contraction? ***  Fatigable weakness?*** Does strength improve after brief exercise?***  Able to brush hair/teeth without difficulty? *** Able to button shirts/use zips? *** Clumsiness/dropping grasped objects?*** Can you arise from squatted position easily? *** Able to get out of chair without using arms? *** Able to walk up steps easily? *** Use an assistive device to walk? *** Significant imbalance with walking? *** Falls?*** Any change in urine color, especially after exertion/physical activity? ***  The patient denies*** symptoms suggestive of oculobulbar weakness including diplopia, ptosis, dysphagia, poor saliva control, dysarthria/dysphonia, impaired mastication, facial weakness/droop.  There are no*** neuromuscular respiratory weakness symptoms, particularly orthopnea>dyspnea.   Pseudobulbar affect is absent***.  The patient does not*** report symptoms referable to  autonomic dysfunction including impaired sweating, heat or cold intolerance, excessive mucosal dryness, gastroparetic early satiety, postprandial abdominal bloating, constipation, bowel or bladder dyscontrol, erectile dysfunction*** or syncope/presyncope/orthostatic intolerance.  There are no*** complaints relating to other symptoms of small fiber modalities including paresthesia/pain.  The patient has not *** noticed any recent skin rashes nor does he*** report any constitutional symptoms like fever, night sweats, anorexia or unintentional weight loss.  EtOH use: ***  Restrictive diet? *** Family history of neuropathy/myopathy/NM disease?***  Previous labs, electrodiagnostics, and neuroimaging are summarized below, but pertinent findings include***  Any biopsy done? *** Current medications being tried for the patient's symptoms include ***  Prior medications that have been tried: ***   MEDICATIONS:  Outpatient Encounter Medications as of 12/16/2024  Medication Sig Note   acyclovir  (ZOVIRAX ) 400 MG tablet Take 1 tablet (400 mg total) by mouth daily as needed (for flare ups).    albuterol  (PROAIR  HFA) 108 (90 Base) MCG/ACT inhaler INHALE 2 PUFFS BY MOUTH EVERY 4 HOURS IF NEEDED FOR WHEEZING OR SHORTNESS OF BREATH (Patient taking differently: Inhale 2 puffs into the lungs every 4 (four) hours as needed for wheezing or shortness of breath.)    Ascorbic Acid  (VITAMIN C) 1000 MG tablet Take 1,000 mg by mouth daily.    atorvastatin  (LIPITOR) 40 MG tablet take 1 tablet by mouth once daily AT 6 PM (Patient taking differently: Take 40 mg by mouth every evening.)    BIKTARVY  50-200-25 MG TABS tablet TAKE 1 TABLET BY MOUTH EVERY DAY (Patient taking differently: Take 1 tablet by mouth daily.)    cholecalciferol  (VITAMIN D3) 25 MCG (1000 UNIT) tablet Take 1,000 Units by mouth daily.    cyanocobalamin  (VITAMIN B12) 1000 MCG tablet TAKE 1 TABLET BY MOUTH EVERY DAY  dextromethorphan  (DELSYM ) 30 MG/5ML  liquid Take 30 mg by mouth as needed for cough.     diclofenac  (VOLTAREN ) 75 MG EC tablet Take 1 tablet (75 mg total) by mouth 2 (two) times daily as needed. Do not take until finished with steroid taper    escitalopram  (LEXAPRO ) 10 MG tablet Take 1 tablet (10 mg total) by mouth daily as needed (anxiety).    famotidine  (PEPCID ) 20 MG tablet Take 20 mg by mouth 2 (two) times daily.    ferrous sulfate  325 (65 FE) MG EC tablet Take 1 tablet (325 mg total) by mouth in the morning and at bedtime. 01/30/2024: Takes 1 x a day   Flaxseed, Linseed, (FLAX SEED OIL) 1000 MG CAPS Take 1,000 mg by mouth daily.    folic acid  (FOLVITE ) 1 MG tablet TAKE 1 TABLET BY MOUTH EVERY DAY    gabapentin  (NEURONTIN ) 300 MG capsule Take 300 mg by mouth 2 (two) times daily.    hydrochlorothiazide  (HYDRODIURIL ) 25 MG tablet Take 25 mg by mouth daily.     isosorbide  mononitrate (IMDUR ) 60 MG 24 hr tablet Take 1 tablet (60 mg total) by mouth daily.    lisinopril  (ZESTRIL ) 40 MG tablet Take 40 mg by mouth daily. 04/02/2023: Per PCP   loratadine  (CLARITIN ) 10 MG tablet Take 10 mg by mouth daily.    meclizine  (ANTIVERT ) 25 MG tablet Take 25 mg by mouth as needed for dizziness.     methocarbamol  (ROBAXIN ) 500 MG tablet Take 1 tablet (500 mg total) by mouth 2 (two) times daily as needed.    methocarbamol  (ROBAXIN -750) 750 MG tablet Take 1 tablet (750 mg total) by mouth 2 (two) times daily as needed for muscle spasms.    metoprolol  succinate (TOPROL -XL) 50 MG 24 hr tablet TAKE 1 TABLET BY MOUTH AT BEDTIME    Omega-3 Fatty Acids (FISH OIL) 1000 MG CPDR Take 1,000 mg by mouth daily.    ondansetron  (ZOFRAN -ODT) 4 MG disintegrating tablet 1 tablet on the tongue and allow to dissolve    pantoprazole  (PROTONIX ) 40 MG tablet Take 1 tablet (40 mg total) by mouth daily.    predniSONE  (STERAPRED UNI-PAK 21 TAB) 5 MG (21) TBPK tablet Take as directed    spironolactone  (ALDACTONE ) 25 MG tablet TAKE 1 TABLET BY MOUTH EVERY DAY (Patient taking  differently: Take 25 mg by mouth daily.)    SYMBICORT 160-4.5 MCG/ACT inhaler Inhale 2 puffs into the lungs 2 (two) times daily.    vitamin A  10000 UNIT capsule Take 10,000 Units by mouth daily.    Facility-Administered Encounter Medications as of 12/16/2024  Medication   acetaminophen  (TYLENOL ) tablet 650 mg   diphenhydrAMINE  (BENADRYL ) capsule 25 mg   ferumoxytol  (FERAHEME ) 510 mg in sodium chloride  0.9 % 100 mL IVPB    PAST MEDICAL HISTORY: Past Medical History:  Diagnosis Date   Abnormal Pap smear    Aortic stenosis    TTE 2021 with mean gradient   Arthritis    Asthma    AVM (arteriovenous malformation)    Bradycardia    Complication of anesthesia    they have a hard time bringing me back (11/08/2015)   Coronary artery disease    High grade RCA disease; diagnosed during acute GIB; on medical therapy   Dynamic left ventricular outflow obstruction    GERD (gastroesophageal reflux disease)    previously on aciphex , discontinued november 2012  because patient asymptomatic, and concern about interference with HIV meds.  May try pepcid  in the  future if symptoms return   Heart murmur    Hepatitis C antibody test positive    HIV infection (HCC)    diagnosed before 2008   Hyperlipidemia 09/12/2021   Hypertension    Hypertensive heart disease    Iron deficiency anemia    Ferritin = 2 in november 2012, started on iron supplemenation   Pacemaker    a. [employer] Jude PPM 01/2018.   Seizures (HCC)    Symptomatic anemia 08/24/2020   Varicosities     PAST SURGICAL HISTORY: Past Surgical History:  Procedure Laterality Date   ABDOMINAL HYSTERECTOMY     CARDIAC CATHETERIZATION N/A 11/08/2015   Procedure: Left Heart Cath and Coronary Angiography;  Surgeon: Candyce GORMAN Reek, MD;  Location: [facility] INVASIVE CV LAB;  Service: Cardiovascular;  Laterality: N/A;   COLONOSCOPY  10/13/11   small adenoma, anal condyloma   COLONOSCOPY  10/13/2011   Procedure: COLONOSCOPY;  Surgeon: Lupita FORBES Commander, MD;  Location: [facility] ENDOSCOPY;  Service: Endoscopy;  Laterality: N/A;   COLONOSCOPY N/A 09/14/2014   Procedure: COLONOSCOPY;  Surgeon: Elsie Cree, MD;  Location: [facility] ENDOSCOPY;  Service: Endoscopy;  Laterality: N/A;   COLONOSCOPY WITH PROPOFOL  N/A 06/05/2020   Procedure: COLONOSCOPY WITH PROPOFOL ;  Surgeon: Cree Elsie, MD;  Location: [facility] ENDOSCOPY;  Service: Endoscopy;  Laterality: N/A;   ENTEROSCOPY N/A 10/31/2020   Procedure: ENTEROSCOPY;  Surgeon: Elicia Claw, MD;  Location: [facility] ENDOSCOPY;  Service: Gastroenterology;  Laterality: N/A;   ESOPHAGOGASTRODUODENOSCOPY  10/13/11   small hiatal hernia   ESOPHAGOGASTRODUODENOSCOPY  10/13/2011   Procedure: ESOPHAGOGASTRODUODENOSCOPY (EGD);  Surgeon: Lupita FORBES Commander, MD;  Location: [facility] ENDOSCOPY;  Service: Endoscopy;  Laterality: N/A;   ESOPHAGOGASTRODUODENOSCOPY N/A 09/14/2014   Procedure: ESOPHAGOGASTRODUODENOSCOPY (EGD);  Surgeon: Elsie Cree, MD;  Location: [facility] ENDOSCOPY;  Service: Endoscopy;  Laterality: N/A;   ESOPHAGOGASTRODUODENOSCOPY (EGD) WITH PROPOFOL  N/A 06/05/2020   Procedure: ESOPHAGOGASTRODUODENOSCOPY (EGD) WITH PROPOFOL ;  Surgeon: Cree Elsie, MD;  Location: [facility] ENDOSCOPY;  Service: Endoscopy;  Laterality: N/A;   GIVENS CAPSULE STUDY N/A 09/14/2014   Procedure: GIVENS CAPSULE STUDY;  Surgeon: Elsie Cree, MD;  Location: [facility] ENDOSCOPY;  Service: Endoscopy;  Laterality: N/A;   GIVENS CAPSULE STUDY N/A 08/26/2020   Procedure: GIVENS CAPSULE STUDY;  Surgeon: Lennard Lesta FALCON, MD;  Location: [facility] ENDOSCOPY;  Service: Endoscopy;  Laterality: N/A;   HOT HEMOSTASIS N/A 10/31/2020   Procedure: HOT HEMOSTASIS (ARGON PLASMA COAGULATION/BICAP);  Surgeon: Elicia Claw, MD;  Location: [facility] ENDOSCOPY;  Service: Gastroenterology;  Laterality: N/A;   PACEMAKER IMPLANT N/A 02/05/2018   Procedure: PACEMAKER IMPLANT;  Surgeon: Kelsie Agent, MD;  Location: [facility] INVASIVE CV LAB;  Service: Cardiovascular;  Laterality: N/A;   POLYPECTOMY   06/05/2020   Procedure: POLYPECTOMY;  Surgeon: Cree Elsie, MD;  Location: [facility] ENDOSCOPY;  Service: Endoscopy;;    ALLERGIES: Allergies[1]  FAMILY HISTORY: Family History  Problem Relation Age of Onset   Diabetes Mother    Ovarian cancer Sister    Colon cancer Sister     SOCIAL HISTORY: Social History[2] Social History   Social History Narrative   Not on file     OBJECTIVE: PHYSICAL EXAM: There were no vitals taken for this visit.  General:*** General appearance: Awake and alert. No distress. Cooperative with exam.  Skin: No obvious rash or jaundice. HEENT: Atraumatic. Anicteric. Lungs: Non-labored breathing on room air  Heart: Regular Abdomen: Soft, non tender. Extremities: No edema. No obvious deformity.  Musculoskeletal: No obvious joint swelling. Psych: Affect appropriate.  Neurological: Mental Status: Alert. Speech  fluent. No pseudobulbar affect Cranial Nerves: CNII: No RAPD. Visual fields grossly intact. CNIII, IV, VI: PERRL. No nystagmus. EOMI. CN V: Facial sensation intact bilaterally to fine touch. Masseter clench strong. Jaw jerk***. CN VII: Facial muscles symmetric and strong. No ptosis at rest or after sustained upgaze***. CN VIII: Hearing grossly intact bilaterally. CN IX: No hypophonia. CN X: Palate elevates symmetrically. CN XI: Full strength shoulder shrug bilaterally. CN XII: Tongue protrusion full and midline. No atrophy or fasciculations. No significant dysarthria*** Motor: Tone is ***. *** fasciculations in *** extremities. *** atrophy. No grip or percussive myotonia.***  Individual muscle group testing (MRC grade out of 5):  Movement     Neck flexion ***    Neck extension ***     Right Left   Shoulder abduction *** ***   Shoulder adduction *** ***   Shoulder ext rotation *** ***   Shoulder int rotation *** ***   Elbow flexion *** ***   Elbow extension *** ***   Wrist extension *** ***   Wrist flexion *** ***   Finger  abduction - FDI *** ***   Finger abduction - ADM *** ***   Finger extension *** ***   Finger distal flexion - 2/3 *** ***   Finger distal flexion - 4/5 *** ***   Thumb flexion - FPL *** ***   Thumb abduction - APB *** ***    Hip flexion *** ***   Hip extension *** ***   Hip adduction *** ***   Hip abduction *** ***   Knee extension *** ***   Knee flexion *** ***   Dorsiflexion *** ***   Plantarflexion *** ***   Inversion *** ***   Eversion *** ***   Great toe extension *** ***   Great toe flexion *** ***     Reflexes:  Right Left   Bicep *** ***   Tricep *** ***   BrRad *** ***   Knee *** ***   Ankle *** ***    Pathological Reflexes: Babinski: *** response bilaterally*** Hoffman: *** Troemner: *** Pectoral: *** Palmomental: *** Facial: *** Midline tap: *** Sensation: Pinprick: *** Vibration: *** Temperature: *** Proprioception: *** Coordination: Intact finger-to- nose-finger bilaterally. Romberg negative.*** Gait: Able to rise from chair with arms crossed unassisted. Normal, narrow-based gait. Able to tandem walk. Able to walk on toes and heels.***  Lab and Test Review: Internal labs: 07/28/24: Lipid panel: tChol 159, LDL 78, TG 79 CMP unremarkable  04/13/24: Ferritin: 46 Folate wnl B12: 474 MMA wnl  External labs: 10/10/24: CBC w/ diff: Hb 7.7, MCV 95.6 BMP significant for glucose 129, Cr 1.06 Mg wnl  10/07/24: BNP elevated to 444 TSH wnl Folate wnl B12: 644  Imaging/Procedures: CT head wo contrast (03/08/2014): Skull and Sinuses:No significant abnormality.   Orbits: Right cataract resection.   Brain: No evidence of acute abnormality, such as acute infarction,  hemorrhage, hydrocephalus, or mass lesion/mass effect. There is  chronic small vessel disease with extensive bilateral cerebral white  matter low density, confluent around the frontal horns of the  lateral ventricles. Low attenuating structures in the bilateral  putamina are thus  likely lacunes (rather than dilated perivascular  spaces).   IMPRESSION:  1. No evidence of acute intracranial disease.  2. Chronic small vessel disease.   Lumbar spine xray (06/01/21): FINDINGS: Frontal and lateral views of the lumbar spine demonstrate 5 non-rib-bearing lumbar type vertebral bodies with right convex scoliosis centered at L2/L3. No acute fractures. Prominent spondylosis and facet  hypertrophy at L4-5 and to a lesser extent L5-S1. Diffuse atherosclerosis.   IMPRESSION: 1. Right convex scoliosis. 2. Spondylosis and facet hypertrophy at L4-5 and L5-S1. ***  ASSESSMENT: Nicole Mccormick is a 78 y.o. female who presents for evaluation of ***. *** has a relevant medical history of ***. *** neurological examination is pertinent for ***. Available diagnostic data is significant for ***. This constellation of symptoms and objective data would most likely localize to ***. ***  PLAN: -Blood work: *** ***  -Return to clinic ***  The impression above as well as the plan as outlined below were extensively discussed with the patient (in the company of ***) who voiced understanding. All questions were answered to their satisfaction.  The patient was counseled on pertinent fall precautions per the printed material provided today, and as noted under the Patient Instructions section below.***  When available, results of the above investigations and possible further recommendations will be communicated to the patient via telephone/MyChart. Patient to call office if not contacted after expected testing turnaround time.   Total time spent reviewing records, interview, history/exam, documentation, and coordination of care on day of encounter:  *** min   Thank you for allowing me to participate in patient's care.  If I can answer any additional questions, I would be pleased to do so.  Venetia Potters, MD   CC: Altidor, Jayson, NP [address] [place]  [address]  CC: Referring provider: Eben Reyes BROCKS, MD [address] [place], [address] [place],  [place] [address]     [1]  Allergies Allergen Reactions   Penicillins     REACTION: GOES INTO SHOCK & THEN PASSES OU Has patient had a PCN reaction causing immediate rash, facial/tongue/throat swelling, SOB or lightheadedness with hypotension: NO Has patient had a PCN reaction causing severe rash involving mucus membranes or skin necrosis: NO Has patient had a PCN reaction that required hospitalization NO Has patient had a PCN reaction occurring within the last 10 years: NO If all of the above answers are NO, then may proceed with Cephalosporin use.    Penicillin G Other (See Comments)   Avelox  [Moxifloxacin  Hcl In Nacl] Itching and Rash  [2]  Social History Tobacco Use   Smoking status: Every Day    Current packs/day: 1.00    Average packs/day: 1 pack/day for 60.0 years (60.0 ttl pk-yrs)    Types: Cigarettes   Smokeless tobacco: Never   Tobacco comments:    Sometimes less.  Vaping Use   Vaping status: Never Used  Substance Use Topics   Alcohol use: Yes    Alcohol/week: 10.0 standard drinks of alcohol    Types: 10 Glasses of wine per week    Comment: wine sometimes.   Drug use: No   "

## 2024-12-09 ENCOUNTER — Encounter: Payer: Self-pay | Admitting: Hematology and Oncology

## 2024-12-14 NOTE — ED Triage Notes (Signed)
 Pt arrives to the ED from home cc of shortness of breath. Pt reports since being discharged from home she has been having chills, generalized fatigue, and poor oral intake. Pt also reports coughing up green mucus. EMS gave 2 duo nebs, 125mg  solumedrol, and 2g of mag enroute.

## 2024-12-14 NOTE — ED Notes (Signed)
 BiPAP removed per provider and placed on 4 L via [place] (baseline) with Sp02 @ 98%   Wilbert Sanders, RN 12/14/24 2155

## 2024-12-14 NOTE — H&P (Signed)
 " HOSPITAL MEDICINE - ADMISSION NOTE  Primary Care Physician Nicole Boas, NP  Primary Contact Extended Emergency Contact Information Primary Emergency Contact: Mccormick,Nicole Mobile Phone: [phone] Relation: Daughter   Chief Complaint Shortness of breath  History of Present Illness   Nicole Mccormick is a 79 y.o. female with history of HTN, HLD, nicotine  use disorder, peripheral neuropathy, HIV, CHF, COPD, iron deficiency anemia, AVM with GI blood loss anemia, as reviewed in EMR, recent hospital stay here and discharged on 12/10/2024 following admission for severe GI blood loss anemia requiring blood transfusion and iron infusions, presenting with 24 hours of shortness of breath and chills, poor oral intake and generalized fatigue, worsening cough productive of greenish sputum.  EMS was called and administered DuoNeb, mag sulfate, Solu-Medrol .  On presentation to the ED she was found to have low-grade fever 100.1, mildly tachypneic, mildly elevated blood pressure, diffuse wheezing and poor air movement, increasing oxygen  requirements and so placed on BiPAP.    VBG showed pH of 7.37 with a pCO2 of 56 bicarb of 32, BNP was 3802 up from about 2700 a week ago.  Influenza A positive.  CXR revealing cardiomegaly with generalized interstitial prominence suspicious for CHF.  She was administered azithromycin  and ceftriaxone, a dose of IV Lasix , DuoNebs, and Tylenol .  She has since been weaned off the BiPAP and saturating at 98 to 99% on 4 L nasal cannula.  Case was discussed with the patient's daughter at the bedside.   Review of Systems 10 point review of negative unless noted in HPI   PMFSH and Home Medications   Past Medical History  Medical History[1]  Surgical History Surgical History[2]   Family History Family History[3]  Social History Social History[4]   Allergies Penicillin  Medications Prescriptions Prior to Admission[5]   Objective   Temp:  [100.1 F  (37.8 C)] 100.1 F (37.8 C) Heart Rate:  [80-91] 91 Resp:  [21-34] 23 BP: (140-168)/(60-87) 143/60 O2 Flow Rate (L/min): 4 L/min  Physical Exam Sleeping calmly, NAD Nasal cannula in place, mild conjunctival pallor Normal rate, regular rhythm, 2/6 systolic murmur Normal work of breathing, no wheezing, mild generalized crackles Soft nondistended nontender abdomen, NBS No lower extremity edema Warm and moist skin  Results from last 7 days  Lab Units 12/14/24 1910 12/10/24 0216  WHITE BLOOD CELL COUNT 10*3/uL 6.06 6.06  HEMOGLOBIN g/dL 8.9* 9.0*  HEMATOCRIT % 29.1* 29.2*  PLATELET COUNT 10*3/uL 177 205    Results from last 7 days  Lab Units 12/14/24 1910 12/10/24 0216  SODIUM mmol/L 140 139  POTASSIUM mmol/L 3.8 3.8  CHLORIDE mmol/L 103 101  CO2 mmol/L 29 34*  BUN mg/dL 14 21  CREATININE mg/dL 9.38 9.05  GLUCOSE mg/dL 896* 94  CALCIUM  mg/dL 8.7 8.3*    Results from last 7 days  Lab Units 12/10/24 0216 12/09/24 0157  MAGNESIUM  mg/dL 1.7* 2.1    No results found for this visit on 12/14/24 (from the past week).   XR Chest 1 View Result Date: 12/14/2024 Cardiomegaly with generalized interstitial prominence suspicious for congestive heart failure. Electronically signed by: Nicole Grieve, MD on 12/14/2024 07:55:04 PM US [facility]   Assessment and Plan  This is an 79 year old lady with a PMH significant for HTN, HLD, nicotine  use disorder, peripheral neuropathy, HIV, CHF, COPD, iron deficiency anemia, AVM with GI blood loss anemia, as reviewed in EMR, recent hospital stay here and discharged on 12/10/2024 following admission for severe GI blood loss anemia requiring blood transfusion and iron infusions,  presenting with a few days of worsening shortness of breath and chills, poor oral intake and generalized fatigue, worsening cough productive of greenish sputum Assessment & Plan COPD with acute exacerbation    (CMD) Influenza A - Possibly exposed to influenza A during her last  admission for which she was discharged 4 days ago - Symptoms appear to have started shortly after discharge - Start oseltamavir - Continue breathing treatments - Continue antibiotics given increased sputum production Acute on chronic systolic congestive heart failure    (CMD) The patient has evidence of acute heart failure with shortness of breath and fatigue and clinical finding of an elevated BNP and radiographic imaging consistent with same. Will diurese patient, monitor vital signs, weight and intake and output closely.  Moderate aortic stenosis Elevated brain natriuretic peptide (BNP) level Moderate-severe AS Moderate-severe MR Pulmonary edema - Requires follow-up with cardiology outpatient, Dr. Blondie Acute respiratory failure with hypoxia and hypercapnia    (CMD) The patient has evidence of acute respiratory failure with the following physical findings of respiratory distress: labored breathing, accessory muscle usage, and tachypnea and is requiring non-invasive ventilatory support to maintain an oxygen  saturation of 90% or greater.  Plan continued close monitoring with adjustment of respiratory support and medications as needed while treating underlying condition. - Continue oxygen  supplementation to keep sats above 90% -Treatment of CHF and COPD exacerbations as above  #Hypertension - lisinopril  40 mg daily, metoprolol  succinate 50 mg daily, and HCTZ 25 mg daily. #Hyperlipidemia - Continue atorvastatin  40 mg daily. #HIV - Continue Viktarvy daily. #Tobacco dependence  - Nicotine  14 mg/24 patch daily. # Peripheral neuropathy, mostly in the left foot, switch gabapentin  to Lyrica. # AVMs with chronic IDA -continue iron supplementation  DVT Prophylaxis: Contraindicated - bleeding. SCDs only recent GI bleed requiring blood transfusion  CODE STATUS: Full Code. Information obtained from patient  Could Nicole Mccormick be considered for Hospital at Home in the next 24 hours?  yes   Anticipate > 2 midnight stay, patient will be admitted as inpatient for COPD with acute exacerbation    (CMD)Estimated date of discharge Dec 17, 2024    Nicole Graciela Keeler, MD        [1] No past medical history on file. [2] No past surgical history on file. [3] No family history on file. [4] Social History Socioeconomic History   Marital status: Married  Tobacco Use   Smoking status: Every Day    Types: Cigarettes   Smokeless tobacco: Never  Substance and Sexual Activity   Drug use: Never   Social Drivers of Health   Living Situation: Low Risk (10/07/2024)   Living Situation    What is your living situation today?: I have a steady place to live    Think about the place you live. Do you have problems with any of the following? Choose all that apply:: None/None on this list  Food Insecurity: Low Risk (10/07/2024)   Food vital sign    Within the past 12 months, you worried that your food would run out before you got money to buy more: Never true    Within the past 12 months, the food you bought just didn't last and you didn't have money to get more: Never true  Transportation Needs: No Transportation Needs (10/07/2024)   Transportation    In the past 12 months, has lack of reliable transportation kept you from medical appointments, meetings, work or from getting things needed for daily living? : No  Utilities: Low  Risk (10/07/2024)   Utilities    In the past 12 months has the electric, gas, oil, or water  company threatened to shut off services in your home? : No  Safety: Low Risk (10/07/2024)   Safety    How often does anyone, including family and friends, physically hurt you?: Never    How often does anyone, including family and friends, insult or talk down to you?: Never    How often does anyone, including family and friends, threaten you with harm?: Never    How often does anyone, including family and friends, scream or curse at you?: Never   Tobacco Use: High Risk (12/07/2024)   Patient History    Smoking Tobacco Use: Every Day    Smokeless Tobacco Use: Never  Depression: Not At Risk (10/08/2024)   PHQ-2    PHQ-2 Score: 0  Social Connections: Socially Integrated (03/26/2023)   Received from [facility]   Social Connection and Isolation Panel    In a typical week, how many times do you talk on the phone with family, friends, or neighbors?: More than three times a week    How often do you get together with friends or relatives?: More than three times a week    How often do you attend church or religious services?: More than 4 times per year    Do you belong to any clubs or organizations such as church groups, unions, fraternal or athletic groups, or school groups?: Yes    How often do you attend meetings of the clubs or organizations you belong to?: 1 to 4 times per year    Are you married, widowed, divorced, separated, never married, or living with a partner?: Married  Physicist, Medical Strain: High Risk (03/26/2023)   Received from [facility]   Overall Financial Resource Strain (CARDIA)    Difficulty of Paying Living Expenses: Hard  [5] (Not in a hospital admission) "

## 2024-12-15 ENCOUNTER — Ambulatory Visit

## 2024-12-15 ENCOUNTER — Encounter: Payer: Self-pay | Admitting: Hematology and Oncology

## 2024-12-15 NOTE — Progress Notes (Signed)
 "   Division of Pharmacy Services  Medication History Completion Note  Name/DOB/Age of Patient: Nicole Mccormick / 22-Apr-1946 / 79 y.o.  Location: [facility] ED  Type: Hospital admission & Modality: In-Person  Confirmed two patient identifiers: Yes  Confirmed patient is alert & oriented: No  Medication History Source (Med History Informants):  Family Member   Which family member responsible for patient's medication management/administration at home? (enter name/phone number): patient's daughter   Asked about any missing medications (such as pumps, injectable meds, TPN, OTC, etc): Yes  PTA Med List:  Prior to Admission Medications     Reviewed by Linnea JONETTA Essex, CPhT on 12/15/24 at 0155    Medication Sig Last Dose Informant Taking? Status  acetaminophen  (TYLENOL ) 325 mg tablet Take 2 tablets (650 mg total) by mouth every 6 (six) hours as needed for moderate pain (4-6) or mild pain (1-3). 12/14/2024 Evening Daughter Yes Active  acyclovir  (ZOVIRAX ) 400 mg tablet Take 400 mg by mouth daily. 12/14/2024 Morning Daughter Yes Active         Med Note HONOR, AMANDA   Tue Dec 07, 2024 11:17 AM)    11/25/2024 400 mg tab (disp 30, 30d supply)    albuterol  2.5 mg /3 mL (0.083 %) nebulizer solution Take 3 mL by nebulization every 4 (four) hours as needed. 12/14/2024 Evening Daughter Yes Active         Med Note (ROBINSON, TANEKA D   Wed Dec 15, 2024  1:51 AM) 12/08/2024 2.5 mg /3 mL (0.083 %) nebu (disp 360, 30d supply)  albuterol  HFA (PROVENTIL  HFA;VENTOLIN  HFA;PROAIR  HFA) 90 mcg/actuation inhaler Inhale 1 puff every 6 (six) hours as needed for shortness of breath or wheezing. for 30 days Unknown Daughter No Active         Med Note HONOR, AMANDA   Tue Dec 07, 2024 11:17 AM)  08/16/2024 90 mcg/actuation HFAA (disp 6.7, 50d supply)    amLODIPine  (NORVASC ) 10 mg tablet Take 10 mg by mouth daily. 12/14/2024 Morning Daughter Yes Active         Med Note HONOR, AMANDA   Tue Dec 07, 2024 11:17  AM)    11/25/2024 10 mg tab (disp 30, 30d supply)    ascorbic acid  (VITAMIN C) 1,000 mg tablet Take 1,000 mg by mouth daily. 12/14/2024 Morning Daughter Yes Active  atorvastatin  (LIPITOR) 40 mg tablet Take 40 mg by mouth at bedtime. 12/13/2024 Bedtime Daughter Yes Active         Med Note HONOR, AMANDA   Tue Dec 07, 2024 11:18 AM)    11/25/2024 40 mg tab (disp 30, 30d supply)    Biktarvy  50-200-25 mg tab per tablet Take 1 tablet by mouth daily. 12/14/2024 Morning Daughter Yes Active         Med Note HONOR, AMANDA   Tue Dec 07, 2024 11:18 AM)    11/25/2024 50-200-25 mg tab (disp 30, 30d supply)    budesonide -glycopyr-formoterol  (Breztri Aerosphere) 160-9-4.8 mcg/actuation inhaler Inhale 2 puffs 2 (two) times a day. 12/14/2024 Morning Daughter Yes Active         Med Note HONOR, AMANDA   Tue Dec 07, 2024 11:18 AM)    10/11/2024 160-9-4.8 mcg/actuation HFAA (disp 10.7, 30d supply)    cholecalciferol  (VITAMIN D3) 1,000 unit (25 mcg) tablet Take 1,000 Units by mouth daily. 12/14/2024 Morning Daughter Yes Active  cyclobenzaprine  (FLEXERIL ) 5 mg tablet Take 1 tablet (5 mg total) by mouth 3 (three) times a day as needed for muscle spasms. Unknown  Daughter No Active         Med Note HONOR, AMANDA   Tue Dec 07, 2024 11:18 AM)    10/11/2024 5 mg tab (disp 30, 10d supply)    dextromethorphan  (DELSYM ) 30 mg/5 mL su12 12 hour suspension Take 30 mg by mouth every 12 (twelve) hours as needed. Unknown Daughter No Active  escitalopram  (LEXAPRO ) 10 mg tablet Take 10 mg by mouth daily as needed. 12/14/2024 Morning Daughter Yes Active         Med Note (ROBINSON, TANEKA D   Wed Dec 15, 2024  1:53 AM)  12/14/2024 10 mg tab (disp 30, 30d supply)    flaxseed oiL 1,000 mg capsule Take 1,000 mg by mouth daily. 12/14/2024 Morning Daughter Yes Active  furosemide  (LASIX ) 20 mg tablet Take 1 tablet (20 mg total) by mouth daily. Take as needed: if you are 3lbs over your dry weight, take one tablet each  day until you are at your dry weight. If you feel more short of breath, call your PCP  Patient taking differently: Take 20 mg by mouth daily. Take as needed: if you are 3lbs over your dry weight, take one tablet each day until you are at your dry weight. If you feel more short of breath, call your PCP   12/14/2024 Morning Daughter Yes Active         Med Note [facility], AMANDA   Tue Dec 07, 2024 11:18 AM)    10/11/2024 20 mg tab (disp 90, 90d supply)    hydroCHLOROthiazide  (HYDRODIURIL ) 25 mg tablet Take 25 mg by mouth daily. 12/14/2024 Morning Daughter Yes Active         Med Note HONOR, AMANDA   Tue Dec 07, 2024 11:19 AM)  11/25/2024 25 mg tab (disp 30, 30d supply)    isosorbide  mononitrate (IMDUR ) 60 mg 24 hr tablet Take 60 mg by mouth daily. Unknown Daughter No Active         Med Note HONOR, AMANDA   Tue Dec 07, 2024 11:19 AM)  11/25/2024 60 mg Tb24 (disp 30, 30d supply)    lisinopriL  (PRINIVIL ) 40 mg tablet Take 40 mg by mouth daily. 12/14/2024 Morning Daughter Yes Active         Med Note HONOR, AMANDA   Tue Dec 07, 2024 11:19 AM)    11/25/2024 40 mg tab (disp 30, 30d supply)    loratadine  (CLARITIN ) 10 mg tablet Take 10 mg by mouth daily. Unknown Daughter No Active  magnesium  oxide 400 mg (241 mg magnesium ) tab Take 1 tablet (400 mg total) by mouth 2 (two) times a day. 12/14/2024 Morning Daughter Yes Active  nicotine  (NICODERM CQ ) 14 mg/24 hr patch Place 1 patch on the skin daily. Unknown Daughter No Active  oxyCODONE (ROXICODONE) 5 mg immediate release tablet Take 1 tablet (5 mg total) by mouth every 6 (six) hours as needed for severe pain (7-10). Unknown Daughter No Active         Med Note JACKOLYN LINNEA BIRCH   Wed Dec 15, 2024  1:49 AM) Lf 12/10/24, #5/2 ds  pantoprazole  (PROTONIX ) 40 mg EC tablet Take 40 mg by mouth before breakfast and before evening meal. 12/14/2024 Morning Daughter Yes Active         Med Note (ROBINSON, TANEKA D   Wed Dec 15, 2024  1:50 AM)     12/08/2024 40 mg TbEC (disp 60, 30d supply)    pregabalin (LYRICA) 25 mg capsule Take 1 capsule (25 mg total) by  mouth 3 (three) times a day. Unknown Daughter No Active         Med Note JACKOLYN BIHARI D   Wed Dec 15, 2024  1:50 AM) Lf 12/10/24, #270/90 ds  Symbicort 160-4.5 mcg/actuation inhaler Inhale 2 puffs 2 (two) times a day. Unknown Daughter No Active         Med Note JACKOLYN BIHARI BIRCH   Wed Dec 15, 2024  1:50 AM) 10/29/2024 160-4.5 mcg/actuation HFAA (disp 10.2, 30d supply)           Audit from Redirected Encounters   **Prior to Admission medications have not yet been reviewed for this encounter**     Selected Pharmacy:  [id number] - [place], [place] - [address] AT [facility] [address] - PHONE: [phone] - FAX: [phone]  Comments: See PTA med list notes  Medications verified with patient's daughter at bedside.  Patient is not taking metoprolol  succinate 50 mg and prednisone  10 mg, removed medications from list.  Daughter is unsure if patient started back taking isosorbide  mononitrate 60 mg or if medication is still on hold.  Patient has not started taking oxycodone 5 mg and pregabalin 25 mg.  Electronically signed by: Taneka D Robinson, CPhT 12/15/2024 1:54 AM   Medications reconciled by provider: No   Signature/Co-signature, if required: Bihari BIRCH Essex, CPhT   Date/Time: 12/15/2024 1:55 AM  "

## 2024-12-16 ENCOUNTER — Ambulatory Visit: Admitting: Neurology

## 2024-12-16 ENCOUNTER — Encounter: Payer: Self-pay | Admitting: Neurology

## 2025-01-05 ENCOUNTER — Ambulatory Visit

## 2025-01-05 NOTE — Therapy (Incomplete)
 "  OUTPATIENT PHYSICAL THERAPY WHEELCHAIR EVALUATION   Patient Name: Nicole Mccormick MRN: [id number] DOB:Apr 29, 1946, 79 y.o., female Today's Date: 01/05/2025  END OF SESSION:   Past Medical History:  Diagnosis Date   Abnormal Pap smear    Aortic stenosis    TTE 2021 with mean gradient   Arthritis    Asthma    AVM (arteriovenous malformation)    Bradycardia    Complication of anesthesia    they have a hard time bringing me back (11/08/2015)   Coronary artery disease    High grade RCA disease; diagnosed during acute GIB; on medical therapy   Dynamic left ventricular outflow obstruction    GERD (gastroesophageal reflux disease)    previously on aciphex , discontinued november 2012  because patient asymptomatic, and concern about interference with HIV meds.  May try pepcid  in the future if symptoms return   Heart murmur    Hepatitis C antibody test positive    HIV infection (HCC)    diagnosed before 2008   Hyperlipidemia 09/12/2021   Hypertension    Hypertensive heart disease    Iron deficiency anemia    Ferritin = 2 in november 2012, started on iron supplemenation   Pacemaker    a. St Jude PPM 01/2018.   Seizures (HCC)    Symptomatic anemia 08/24/2020   Varicosities    Past Surgical History:  Procedure Laterality Date   ABDOMINAL HYSTERECTOMY     CARDIAC CATHETERIZATION N/A 11/08/2015   Procedure: Left Heart Cath and Coronary Angiography;  Surgeon: Candyce GORMAN Reek, MD;  Location: [facility] INVASIVE CV LAB;  Service: Cardiovascular;  Laterality: N/A;   COLONOSCOPY  10/13/11   small adenoma, anal condyloma   COLONOSCOPY  10/13/2011   Procedure: COLONOSCOPY;  Surgeon: Lupita FORBES Commander, MD;  Location: [facility] ENDOSCOPY;  Service: Endoscopy;  Laterality: N/A;   COLONOSCOPY N/A 09/14/2014   Procedure: COLONOSCOPY;  Surgeon: Elsie Cree, MD;  Location: [facility] ENDOSCOPY;  Service: Endoscopy;  Laterality: N/A;   COLONOSCOPY WITH PROPOFOL  N/A 06/05/2020   Procedure: COLONOSCOPY WITH  PROPOFOL ;  Surgeon: Cree Elsie, MD;  Location: [facility] ENDOSCOPY;  Service: Endoscopy;  Laterality: N/A;   ENTEROSCOPY N/A 10/31/2020   Procedure: ENTEROSCOPY;  Surgeon: Elicia Claw, MD;  Location: [facility] ENDOSCOPY;  Service: Gastroenterology;  Laterality: N/A;   ESOPHAGOGASTRODUODENOSCOPY  10/13/11   small hiatal hernia   ESOPHAGOGASTRODUODENOSCOPY  10/13/2011   Procedure: ESOPHAGOGASTRODUODENOSCOPY (EGD);  Surgeon: Lupita FORBES Commander, MD;  Location: [facility] ENDOSCOPY;  Service: Endoscopy;  Laterality: N/A;   ESOPHAGOGASTRODUODENOSCOPY N/A 09/14/2014   Procedure: ESOPHAGOGASTRODUODENOSCOPY (EGD);  Surgeon: Elsie Cree, MD;  Location: [facility] ENDOSCOPY;  Service: Endoscopy;  Laterality: N/A;   ESOPHAGOGASTRODUODENOSCOPY (EGD) WITH PROPOFOL  N/A 06/05/2020   Procedure: ESOPHAGOGASTRODUODENOSCOPY (EGD) WITH PROPOFOL ;  Surgeon: Cree Elsie, MD;  Location: [facility] ENDOSCOPY;  Service: Endoscopy;  Laterality: N/A;   GIVENS CAPSULE STUDY N/A 09/14/2014   Procedure: GIVENS CAPSULE STUDY;  Surgeon: Elsie Cree, MD;  Location: [facility] ENDOSCOPY;  Service: Endoscopy;  Laterality: N/A;   GIVENS CAPSULE STUDY N/A 08/26/2020   Procedure: GIVENS CAPSULE STUDY;  Surgeon: Lennard Lesta FALCON, MD;  Location: [facility] ENDOSCOPY;  Service: Endoscopy;  Laterality: N/A;   HOT HEMOSTASIS N/A 10/31/2020   Procedure: HOT HEMOSTASIS (ARGON PLASMA COAGULATION/BICAP);  Surgeon: Elicia Claw, MD;  Location: [facility] ENDOSCOPY;  Service: Gastroenterology;  Laterality: N/A;   PACEMAKER IMPLANT N/A 02/05/2018   Procedure: PACEMAKER IMPLANT;  Surgeon: Kelsie Agent, MD;  Location: [facility] INVASIVE CV LAB;  Service: Cardiovascular;  Laterality: N/A;  POLYPECTOMY  06/05/2020   Procedure: POLYPECTOMY;  Surgeon: Burnette Fallow, MD;  Location: [facility] ENDOSCOPY;  Service: Endoscopy;;   Patient Active Problem List   Diagnosis Date Noted   Tremor observed on examination 07/28/2024   Falls 07/28/2024   Chronic blood loss anemia 03/26/2023   History of herpes genitalis  03/25/2023   Hyperlipidemia 09/12/2021   Pacemaker 09/12/2021   Aortic stenosis 08/17/2021   Knee pain, right 08/17/2021   Iron deficiency anemia due to chronic blood loss 01/03/2021   Unspecified severe protein-calorie malnutrition 11/14/2020   Acute blood loss anemia 10/30/2020   AVM (arteriovenous malformation) of stomach, acquired 08/26/2020   COPD (chronic obstructive pulmonary disease) (HCC) 08/24/2020   Arthritis 08/23/2020   Dyspnea    Shortness of breath    Severe anemia 06/03/2020   Osteoarthritis of wrist 04/28/2019   Osteoarthritis of glenohumeral joint 04/28/2019   Chronic arthralgias of knees and hips 07/06/2018   Sick sinus syndrome (HCC) 02/28/2016   Hypertensive cardiovascular disease 02/28/2016   Bilateral carotid bruits 02/12/2016   LVH (left ventricular hypertrophy) 01/15/2016   GI bleed 11/07/2015   Anxiety 07/22/2015   Seasonal allergies 07/22/2015   Asthma, chronic 07/22/2015   Health care maintenance 02/17/2015   Hepatitis C 08/07/2014   Back pain 10/28/2012   LGSIL Pap smear of vagina 01/16/2012   Anal condylomata 11/18/2011   CAD (coronary artery disease) 08/07/2011   Essential hypertension, benign 12/25/2010   Human immunodeficiency virus (HIV) disease (HCC) 01/23/2007   TOBACCO ABUSE 01/23/2007   CATARACT NEC 01/23/2007   Gastroesophageal reflux disease 01/23/2007    PCP: Roxene Savant, NP  REFERRING PROVIDER: Roxene Savant, NP  THERAPY DIAG:  No diagnosis found.  Rationale for Evaluation and Treatment Rehabilitation  SUBJECTIVE:                                                                                                                                                                                           SUBJECTIVE STATEMENT: Pt presents for wheelchair evaluation. Patient has been in and out of ED/hospital multiple times since October 2025 for SOB, fatigue, weakness, blood loss, and COPD exacerbation.   PRECAUTIONS:  Fall  RED FLAGS: None  WEIGHT BEARING RESTRICTIONS No    OCCUPATION: ***  PLOF:  {PLOF:24004}  PATIENT GOALS: ***         MEDICAL HISTORY:  Primary diagnosis onset: 12/28/2024 - date of referral     Medical Diagnosis with ICD-10 code: J96.11 (ICD-10-CM) - Chronic respiratory failure with hypoxia  Z99.81 (ICD-10-CM) - Dependence on supplemental oxygen   R26.81 (ICD-10-CM) - Unsteadiness on feet   [] Progressive disease  Relevant future  surgeries:     Height: 5' Weight: 123 lbs Explain recent changes or trends in weight:      History:  Past Medical History:  Diagnosis Date   Abnormal Pap smear    Aortic stenosis    TTE 2021 with mean gradient   Arthritis    Asthma    AVM (arteriovenous malformation)    Bradycardia    Complication of anesthesia    they have a hard time bringing me back (11/08/2015)   Coronary artery disease    High grade RCA disease; diagnosed during acute GIB; on medical therapy   Dynamic left ventricular outflow obstruction    GERD (gastroesophageal reflux disease)    previously on aciphex , discontinued november 2012  because patient asymptomatic, and concern about interference with HIV meds.  May try pepcid  in the future if symptoms return   Heart murmur    Hepatitis C antibody test positive    HIV infection (HCC)    diagnosed before 2008   Hyperlipidemia 09/12/2021   Hypertension    Hypertensive heart disease    Iron deficiency anemia    Ferritin = 2 in november 2012, started on iron supplemenation   Pacemaker    a. St Jude PPM 01/2018.   Seizures (HCC)    Symptomatic anemia 08/24/2020   Varicosities        Cardio Status:  Functional Limitations:   [] Intact  []  Impaired      Respiratory Status:  Functional Limitations:   [] Intact  [] Impaired   [] SOB [] COPD [] O2 Dependent ______LPM  [] Ventilator Dependent  Resp equip:                                                     Objective Measure(s):   Orthotics:   [] Amputee:                                                              [] Prosthesis:        HOME ENVIRONMENT:  [] House [] Condo/town home [] Apartment [] Asst living [] LTCF         [] Own  [] Rent   [] Lives alone [] Lives with others -                             Hours without assistance:   [] Home is accessible to patient                                 Storage of wheelchair:  [] In home   [] Other Comments:        COMMUNITY :  TRANSPORTATION:  [] Car [] Tefl Teacher [] Adapted w/c Lift []  Ambulance [] Other:                     [] Sits in wheelchair during transport   Where is w/c stored during transport?  [] T[employer]s  []  [employer]  r   [] Self-Driver       Drive while in  Biomedical Scientist [] yes [] no   Employment and/or school:  Specific requirements pertaining  to mobility        Other:  COMMUNICATION:  Verbal Communication  [] WFL [] receptive [] WFL [] expressive [] Understandable  [] Difficult to understand  [] non-communicative  Primary Language:______________ 2nd:_____________  Communication provided by:[] Patient [] Family [] Caregiver [] Translator   [] Uses an augmentative communication device     Manufacturer/Model :                                                                MOBILITY/BALANCE:  Sitting Balance  Standing Balance  Transfers  Ambulation   [] WFL      [] WFL  [] Independent  []  Independent   [] Uses UE for balance in sitting Comments:  [] Uses UE/device for stability Comments:  []  Min assist  []  Ambulates independently with       device:___________________      []  Mod assist  []  Able to ambulate ______ feet        safely/functionally/independently   []  Min assist  []  Min assist  []  Max assist  []  Non-functional ambulator         History/High risk of falls   []  Mod assist  []  Mod assist  []  Dependent  []  Unable to ambulate   []  Max  assist  []  Max assist  Transfer method:[] 1 person [] 2 person [] sliding board [] squat pivot [] stand pivot [] mechanical patient lift  [] other:   []  Unable  []   Unable    Fall History: # of falls in the past 6 months? *** # of near falls in the past 6 months? ***    CURRENT SEATING / MOBILITY:  Current Mobility Device: [] None [] Cane/Walker [] Manual [] Dependent [] Dependent w/ Tilt rScooter  [] Power (type of control):   Manufacturer:  Model:  Serial #:   Size:  Color:  Age:   Purchased by whom:   Current condition of mobility base:    Current seating system:                                                                       Age of seating system:    Describe posture in present seating system:    Is the current mobility meeting medical necessity?:  [] Yes [] No Describe:                                     Ability to complete Mobility-Related Activities of Daily Living (MRADL's) with Current Mobility Device:   Move room to room  [] Independent  [] Min [] Mod [] Max assist  [] Unable  Comments:   Meal prep  [] Independent  [] Min [] Mod [] Max assist  [] Unable    Feeding  [] Independent  [] Min [] Mod [] Max assist  [] Unable    Bathing  [] Independent  [] Min [] Mod [] Max assist  [] Unable    Grooming  [] Independent  [] Min [] Mod [] Max assist  [] Unable    UE dressing  [] Independent  [] Min [] Mod [] Max assist  [] Unable    LE dressing  [] Independent   [] Min [] Mod [] Max assist  [] Unable  Toileting  [] Independent  [] Min [] Mod [] Max assist  [] Unable    Bowel Mgt: []  Continent []  Incontinent []  Accidents []  Diapers []  Colostomy []  Bowel Program:  Bladder Mgt: []  Continent []  Incontinent []  Accidents []  Diapers []  Urinal []  Intermittent Cath []  Indwelling Cath []  Supra-pubic Cath     Current Mobility Equipment Trialed/ Ruled Out:    Does not meet mobility needs due to:    Mark all boxes that indicate inability to use the specific equipment listed     Meets needs for safe  independent functional  ambulation  / mobility    Risk of  Falling or History of Falls    Enviromental limitations      Cognition    Safety concerns with  physical ability    Decreased  / limitations endurance  & strength     Decreased / limitations  motor skills  & coordination    Pain    Pace /  Speed    Cardiac and/or  respiratory condition    Contra - indicated by diagnosis   Cane/Crutches  []   []   []   []   []   []   []   []   []   []   []    Walker / Rollator  []  NA   []   []   []   []   []   []   []   []   []   []   []     Manual Wheelchair [id number]:  []  NA  []   []   []   []   []   []   []   []   []   []   []    Manual W/C (K0005) with power assist  []  NA  []   []   []   []   []   []   []   []   []   []   []    Scooter  []  NA  []   []   []   []   []   []   []   []   []   []   []    Power Wheelchair: standard joystick  []  NA  []   []   []   []   []   []   []   []   []   []   []    Power Wheelchair: alternative controls  []  NA  []   []   []   []   []   []   []   []   []   []   []    Summary:  The least costly alternative for independent functional mobility was found to be:    []  Crutch/Cane  []  Walker []  Manual w/c  []  Manual w/c with power assist   []  Scooter   []  Power w/c std joystick   []  Power w/c alternative control        []  Requires dependent care mobility device   Cabin Crew for [employer] skills are adequate for safe mobility equipment operation  []   Yes []   No  Patient is willing and motivated to use recommended mobility equipment  []   Yes []   No       []  Patient is unable to safely operate mobility equipment independently and requires dependent care equipment Comments:           SENSATION and SKIN ISSUES:  Sensation []  Intact  []  Impaired []  Absent []  Hyposensate []  Hypersensate  []  Defensiveness  Location(s) of impairment:    Pressure Relief Method(s):  []  Lean side to side to offload (without risk of falling)  []   W/C push up (4+ times/hour for 15+ seconds) []  Stand up (without risk of falling)    []  Other: (Describe): Effective  pressure relief method(s) above can be performed consistently throughout the day: [] Yes  []  No If not, Why?:  Skin Integrity Risk:       []   Low risk           []  Moderate risk            []  High risk  If high risk, explain:   Skin Issues/Skin Integrity  Current skin Issues  []  Yes []  No []  Intact  []   Red area   []   Open area  []  Scar tissue  []  At risk from prolonged sitting  Where: History of Skin Issues  []  Yes []  No Where : When: Stage: Hx of skin flap surgeries  []  Yes []  No Where:  When:  Pain: []  Yes []  No   Pain Location(s):  Intensity scale: (0-10) : How does pain interfere with mobility and/or MRADLs? -         MAT EVALUATION:  Neuro-Muscular Status: (Tone, Reflexive, Responses, etc.)     []   Intact   []  Spasticity:  []  Hypotonicity  []  Fluctuating  []  Muscle Spasms  []  Poor Righting Reactions/Poor Equilibrium Reactions  []  Primal Reflex(s):    Comments:            COMMENTS:    POSTURE:     Comments:  Pelvis Anterior/Posterior:  []  Neutral   []  Posterior  []  Anterior  []  Fixed - No movement []  Tendency away from neutral []  Flexible []  Self-correction []  External correction Obliquity (viewed from front)  []  WFL []  R Obliquity []  L Obliquity  []  Fixed - No movement []  Tendency away from neutral []  Flexible []  Self-correction []  External correction Rotation  []  WFL []  R anterior []  L anterior  []  Fixed - No movement []  Tendency away from neutral []  Flexible []  Self-correction []  External correction Tonal Influence Pelvis:  []  Normal []  Flaccid []  Low tone []  Spasticity []  Dystonia []  Pelvis thrust []  Other:    Trunk Anterior/Posterior:  []  WFL []  Thoracic kyphosis []  Lumbar lordosis  []  Fixed - No movement []  Tendency away from neutral []  Flexible []  Self-correction []  External correction  []  WFL []  Convex to left  []  Convex to right []  S-curve   []  C-curve []  Multiple curves []  Tendency away from neutral []  Flexible []  Self-correction []  External correction Rotation of shoulders and upper trunk:  []  Neutral []  Left-anterior []  Right-  anterior []  Fixed- no movement []  Tendency away from neutral []  Flexible []  Self correction []  External correction Tonal influence Trunk:  []  Normal []  Flaccid []  Low tone []  Spasticity []  Dystonia []  Other:   Head & Neck  []  Functional []  Flexed    []  Extended []  Rotated right  []  Rotated left []  Laterally flexed right []  Laterally flexed left []  Cervical hyperextension   []  Good head control []  Adequate head control []  Limited head control []  Absent head control Describe tone/movement of head and neck:      Lower Extremity Measurements: LE ROM:  {AROM/PROM:27142} ROM Right 01/05/2025 Left 01/05/2025  Hip flexion    Hip extension    Hip abduction    Hip adduction    Knee flexion    Knee extension    Ankle dorsiflexion    Ankle plantarflexion     (Blank rows = not tested)  LE MMT:  MMT Right 01/05/2025 Left 01/05/2025  Hip flexion    Hip extension    Hip abduction    Hip adduction  Knee flexion    Knee extension    Ankle dorsiflexion    Ankle plantarflexion     (Blank rows = not tested)  Hip positions:  []  Neutral   []  Abducted   []  Adducted  []  Subluxed   []  Dislocated   []  Fixed   []  Tendency away from neutral []  Flexible []  Self-correction []  External correction   Hip Windswept:[]  Neutral  []  Right    []  Left  []  Subluxed   []  Dislocated   []  Fixed   []  Tendency away from neutral []  Flexible []  Self-correction []  External correction  LE Tone: []  Normal []  Low tone []  Spasticity []  Flaccid []  Dystonia []  Rocks/Extends at hip []  Thrust into knee extension []  Pushes legs downward into footrest  Foot positioning: ROM Concerns: Dorsiflexed: []  Right   []  Left Plantar flexed: []  Right    []  Left Inversion: []  Right    []  Left Eversion: []  Right    []  Left  LE Edema: []  1+ (Barely detectable impression when finger is pressed into skin) []  2+ (slight indentation. 15 seconds to rebound) []  3+ (deeper indentation. 30 seconds to  rebound) []  4+ (>30 seconds to rebound)  UE Measurements:  UPPER EXTREMITY ROM:   {AROM/PROM:27142} ROM Right 01/05/2025 Left 01/05/2025  Shoulder flexion    Shoulder abduction    Shoulder adduction    Elbow flexion    Elbow extension    Wrist flexion    Wrist extension    (Blank rows = not tested)  UPPER EXTREMITY MMT:  MMT Right 01/05/2025 Left 01/05/2025  Shoulder flexion    Shoulder abduction    Shoulder adduction    Elbow flexion    Elbow extension    Wrist flexion    Wrist extension    Pinch strength    Grip strength    (Blank rows = not tested)  Shoulder Posture:  Right Tendency towards Left  []   Functional []    []   Elevation []    []   Depression []    []   Protraction []    []   Retraction []    []   Internal rotation []    []   External rotation []    []   Subluxed []     UE Tone: []  Normal []  Flaccid []  Low tone []  Spasticity  []  Dystonia []  Other:   UE Edema: []  1+ (Barely detectable impression when finger is pressed into skin) []  2+ (slight indentation. 15 seconds to rebound) []  3+ (deeper indentation. 30 seconds to rebound) []  4+ (>30 seconds to rebound)  Wrist/Hand: Handedness: []  Right   []  Left   []  NA: Comments:  Right  Left  []   WNL []    []   Limitations []    []   Contractures []    []   Fisting []    []   Tremors []    []   Weak grasp []    []   Poor dexterity []    []   Hand movement non functional []    []   Paralysis []         MOBILITY BASE RECOMMENDATIONS and JUSTIFICATION:  MOBILITY BASE  JUSTIFICATION   Manufacturer:    Model:                              Color:  Seat Width:   Seat Depth    []  Manual mobility base (continue below)   []  Scooter/POV  []  Power mobility base   Number of hours per day spent in  above selected mobility base:   Typical daily mobility base use Schedule:    []  is not a safe, functional ambulator  []  limitation prevents from completing a MRADL(s) within a reasonable time frame    []  limitation places at  high risk of morbidity or mortality secondary to  the attempts to perform a    MRADL(s)  []  limitation prevents accomplishing a MRADL(s) entirely  []  provide independent mobility  []  equipment is a lifetime medical need  []  walker or cane inadequate  []  any type manual wheelchair      inadequate  []  scooter/POV inadequate      []  requires dependent mobility          MANUAL MOBILITY      []  Standard manual wheelchair  K0001      Arm:    []  both []  right  []  left      Foot:   []  both []  right   []  left  []  self-propels wheelchair  []  will use on regular basis  []  chair fits throughout home  []  willing and motivated to use  []  propels with assistance     []  dependent use   []  Standard hemi-manual wheelchair  K0002      Arm:    []  both []  right  []  left      Foot:   []  both []  right   []  left  []  lower seat height required to foot propel  []  short stature  []  self-propels wheelchair  []  will use on regular basis  []  chair fits throughout home  []  willing and motivated to use   []  propels with assistance  []  dependent use   []  Lightweight manual wheelchair  K0003      Arm:    []  both []  right  []  left      Foot:   []  both  []  right  []  left                   []  hemi height required  []  medical condition and weight of  wheelchair affect ability to self      propel standard manual wheelchair in the residence  []  can and does self-propel (marginal propulsion skills)  []  daily use _________hours  []  chair fits throughout home  []  willing and motivated to use  []  lower seat height required to foot propel  []  short stature   []  High strength lightweight manual  wheelchair (Breezy Ultra 4)  K0004     Arm:    []  both []  right  []  left     Foot:   []  both []  right   []  left                                                                  []  hemi height required []  medical condition and weight of wheelchair affect ability to self propel while engaging in frequent MRADL(s) that  cannot be performed in a standard or lightweight manual wheelchair  []  daily use _________hours  []  chair fits throughout home  []  willing and motivated to use  []  prevent repetitive use injuries   []  lower seat height required to foot propel  []  short stature    []   Ultra-lightweight manual wheelchair  K0005     Arm:    []  both []  right  []  left     Foot:   []  both []  right  []  left       []  hemi height required  []  heavy duty    Front seat to floor _____ inches      Rear seat to floor _____ inches      Back height _____ inches     Back angle ______ degrees      Front angle _____ degrees  []   full-time manual wheelchair user  []  Requires individualized fitting and optimal adjustments for multiple features that include adjustable axle configuration, fully adjustable center of gravity, wheel camber, seat and back angle, angle of seat slope, which cannot be accommodated by a K0001 through K0004 manual wheelchair  []  prevent repetitive use injuries  []  daily use_________hours   []  user has high activity patterns that frequently require  them  to go out into the community for the purpose of independently accomplishing high level MRADL activities. Examples of these might include a combination of; shopping, work, school, photographer, childcare, independently loading and unloading from a vehicle etc.  []  lower seat height required to foot propel  []  short stature  []  heavy duty -  weight over 250lbs   []  Current chair is a K0005   manufacture:___________________  model:_________________  serial#____________________  age:_________    []  First time X9994 user (complete trial)  K0004 time and # of strokes to propel 30 feet: ________seconds _________strokes  X9994 time and # of strokes to propel 30 feet: ________seconds _________strokes  What was the result of the trial between the K0004 and K0005 manual wheelchair? ___    What features of the K0005 w/c are needed as compared to the  K0004 base? Why?___    []  adjustable seat and back angle changes the angle of seat slope of the frame to attain a gravity assisted position for efficient propulsion and proper weight distribution along the frame     []  the front of the wheelchair will be configured higher than the back of the chair to allow gravity to assist the user with postural stability  []  the center of the wheel will be positioned for stability, safety and efficient propulsion  []  adjustable axle allows for vertical, horizontal, camber and overall width changes  throughout the wheels for adjustment of the client's exact needs and abilities.   []  adjustable axle increases the stability and function of the chair allowing for adjustment of the center of gravity.   []  accommodates the client's anatomical position in the chair maximizing independence in mobility and maneuverability in all environments.   []  create a minimal fixed tilt-in space to assist in positioning.   []  Describe users full-time manual wheelchair activity patterns:___    []  Power assist Comments:  []  prevent repetitive use injuries  []  repetitive strain injury present in    shoulder girdle    []  shoulder pain is (> or =) to 7/10     during manual propulsion       Current Pain _____/10  []  requires conservation of energy to participate in MRADL(s) runable to propel up ramps or curbs using manual wheelchair  []  been K0005 user greater than one year  []  user unwilling to use power      wheelchair (reason): []  less expensive option to power   wheelchair   []  rim activated power assist -  decreased strength   []  Heavy duty manual wheelchair       K0006     Arm:    []  both []  right  []  left     Foot:   []  both []  right  []  left     []  hemi height required    []  Dependent base  []  user exceeds 250lbs  []  non-functional ambulator    []  extreme spasticity  []  over active movement   []  broken frame/hx of repeated     repairs  []  able to self-propel in  residence       []  lower seat to floor height required  []  unable to self-propel in residence   []  Extra heavy duty manual wheelchair  K0007     Arm:    []  both []  right  []  left     Foot:   []  both []  right  []  left     []  hemi height required  []  Dependent base  []  user exceeds 300lbs  []  non-functional ambulator    []  able to self-propel in residence   []  lower seat to floor height required  []  unable to self-propel in residence     []  Manual wheelchair with tilt [phone]      (Manual Tilt-n-Space)  []  patient is dependent for transfers  []  patient requires frequent       positioning for pressure relief   []  patient requires frequent      positioning for poor/absent trunk control        []  Stroller Base  []  infant/child   []  unable to propel manual      wheelchair  []  allows for growth  []  non-functional ambulator  []  non-functional UE  []  independent mobility is not a goal at this time    MANUAL FRAME OPTIONS      Push handles  []  extended   []  angle adjustable   []  standard  []  caregiver access  []  caregiver assist    []  allows hooking to enable      increased ability to perform ADLs or maintain balance   []  Angle Adjustable Back  []  postural control  []  control of tone/spasticity  []  accommodation of range of motion  []  UE functional control  []  accommodation for seating system    Rear wheel placement  []  std/fixed  [] fully adjustableramputee   []  camber ________degree  []  removable rear wheel  []  non-removable rear wheel  Wheel size _______  Wheel style_______________________  []  improved UE access to wheels  []  increase propulsion ability  []  improved stability  []  changing angle in space for      improvement of postural stability  []  remove for transport    []  allow for seating system to fit on  base  []  amputee placement  []  1-arm drive access   r R  r L  []  enable propulsion of manual       wheelchair with one arm    []  amputee placement   Wheel rims/  Hand rims  []  Standard    []  Specialized-____ []  provide ability to propel manual   []  increase self-propulsion with hand wheelchair weakness/decreased grasp     []  Spoke protector/guard   []  prevent hands from getting caught in spokes   Tires:  []  pneumatic  []  flat free inserts  []  solid  Style:  []  decrease roll resistance              []   prevent frequent flats  []  increase shock absorbency  []  decrease maintenance   []  decrease pain from road shock    []  decrease spasms from road shock    Wheel Locks:    []  push []  pull []  scissor  []  lock wheels for transfers  []  lock wheels from rolling   Brake/wheel lock extension:  []  R  []  L  []  allow user to operate wheel locks due to decreased reach or strength   Caster housing:  Caster size:                      Style:                                          []  suspension fork  []  maneuverability   []  stability of wheelchair   []  durability  []  maintenance  []  angle adjustment for posture  []  allow for feet to come under        wheelchair base  []  allows change in seat to floor  height   []  increase shock absorbency  []  decrease pain from road shock  []  decrease spasms from road    shock   []  Side guards  []  prevent clothing getting caught in wheel or becoming soiled   [] provide hip and pelvic stability  []  eliminates contact between body and wheels  []  limit hand contact with wheels   []  Anti-tippers      []  prevent wheelchair from tipping    backward  []  assist caregiver with curbs     POWER MOBILITY      []  Scooter/POV    []  can safely operate   []  can safely transfer   []  has adequate trunk stability   []  cannot functionally propel  manual wheelchair    []  Power mobility base    []  non-ambulatory   []  cannot functionally propel manual wheelchair   []  cannot functionally and safely      operate scooter/POV  []  can safely operate power       wheelchair  []  home is accessible  []  willing to use power wheelchair      Tilt  []  Powered tilt on powered chair  []  Powered tilt on manual chair  []  Manual tilt on manual chair Comments:  []  change position for pressure      []  elief/cannot weight shift   []  change position against      gravitational force on head and      shoulders   []  decrease pain  []  blood pressure management   []  control autonomic dysreflexia  []  decrease respiratory distress  []  management of spasticity  []  management of low tone  []  facilitate postural control   []  rest periods   []  control edema  []  increase sitting tolerance   []  aid with transfers     Recline   []  Power recline on power chair  []  Manual recline on manual chair  Comments:    []  intermittent catheterization  []  manage spasticity  []  accommodate femur to back angle  []  change position for pressure relief/cannot weight shift rhigh risk of pressure sore development  []  tilt alone does not accomplish     effective pressure relief, maximum pressure relief achieved at -      _______ degrees tilt   _______ degrees recline   []   difficult to transfer to and from bed []  rest periods and sleeping in chair  []  repositioning for transfers  []  bring to full recline for ADL care  []  clothing/diaper changes in chair  []  gravity PEG tube feeding  []  head positioning  []  decrease pain  []  blood pressure management   []  control autonomic dysreflexia  []  decrease respiratory distress  []  user on ventilator     Elevator on mobility base  []  Power wheelchair  []  Scooter  []  increase Indep in transfers   []  increase Indep in ADLs    []  bathroom function and safety  []  kitchen/cooking function and safety  []  shopping  []  raise height for communication at standing level  []  raise height for eye contact which reduces cervical neck strain and pain  []  drive at raised height for safety and navigating crowds  []  Other:   []  Vertical position system  (anterior tilt)     (Drive locks-out)    []  Stand       (Drive  enabled)  []  independent weight bearing  []  decrease joint contractures  []  decrease/manage spasticity  []  decrease/manage spasms  []  pressure distribution away from   scapula, sacrum, coccyx, and ischial tuberosity  []  increase digestion and elimination   []  access to counters and cabinets  []  increase reach  []  increase interaction with others at eye level, reduces neck strain  []  increase performance of       MRADL(s)      Power elevating legrest    []  Center mount (Single) 85-170 degrees       []  Standard (Pair) 100-170 degrees  []  position legs at 90 degrees, not available with std power ELR  []  center mount tucks into chair to decrease turning radius in home, not available with std power ELR  []  provide change in position for LE  []  elevate legs during recline    []  maintain placement of feet on      footplate  []  decrease edema  []  improve circulation  []  actuator needed to elevate legrest  []  actuator needed to articulate legrest preventing knees from flexing  []  Increase ground clearance over      curbs  []   STD (pair) independently                     elevate legrest   POWER WHEELCHAIR CONTROLS      Controls/input device  []  Expandable  []  Non-expandable  []  Proportional  []  Right Hand []  Left Hand  []  Non-proportional/switches/head-array  []  Electrical/proximity         []   Mechanical      Manufacturer:___________________   Type:________________________ []  provides access for controlling wheelchair  []  programming for accurate control  []  progressive disease/changing condition  []  required for alternative drive      controls       []  lacks motor control to operate  proportional drive control  []  unable to understand proportional controls  []  limited movement/strength  []  extraneous movement / tremors / ataxic / spastic       []  Upgraded electronics controller/harness    []  Single power (tilt or recline)   []  Expandable    []  Non-expandable plus   []   Multi-power (tilt, recline, power legrest, power seat lift, vertical positioning system, stand)  []  allows input device to communicate with drive motors  []  harness provides necessary connections between the controller, input device, and seat functions     []   needed in order to operate power seat functions through joystick/ input device  []  required for alternative drive controls     []  Enhanced display  []  required to connect all alternative drive controls   []  required for upgraded joystick      (lite-throw, heavy duty, micro)  []  Allows user to see in which mode and drive the wheelchair is set; necessary for alternate controls       []  Upgraded tracking electronics  []  correct tracking when on uneven surfaces makes switch driving more efficient and less fatiguing  []  increase safety when driving  []  increase ability to traverse thresholds    []  Safety / reset / mode switches     Type:    []  Used to change modes and stop the wheelchair when driving     []  Mount for joystick / input device/switches  []  swing away for access or transfers   []  attaches joystick / input device / switches to wheelchair   []  provides for consistent access  []  midline for optimal placement    []  Attendant controlled joystick plus     mount  []  safety  []  long distance driving  []  operation of seat functions  []  compliance with transportation regulations    []  Battery  []  required to power (power assist / scooter/ power wc / other):   []  Power inverter (24V to 12V)  []  required for ventilator / respiratory equipment / other:     CHAIR OPTIONS MANUAL & POWER      Armrests   []  adjustable height []  removable  []  swing away []  fixed  []  flip back  []  reclining  []  full length pads []  desk []  tube arms []  gel pads  []  provide support with elbow at 90    []  remove/flip back/swing away for  transfers  []  provide support and positioning of upper body    []  allow to come closer to table top  []  remove for access  to tables  []  provide support for w/c tray  []  change of height/angles for variable activities   []  Elbow support / Elbow stop  []  keep elbow positioned on arm pad  []  keep arms from falling off arm pad  during tilt and/or recline   Upper Extremity Support  []  Arm trough  []   R  []   L  Style:  []  swivel mount []  fixed mount   []  posterior hand support  []   tray  []  full tray  []  joystick cut out  []   R  []   L  Style:  []  decrease gravitational pull on      shoulders  []  provide support to increase UE  function  []  provide hand support in natural    position  []  position flaccid UE  []  decrease subluxation    []  decrease edema       []  manage spasticity   []  provide midline positioning  []  provide work surface  []  placement for AAC/ Computer/ EADL       Hangers/ Legrests   []  ______ degree  []  Elevating []  articulating  []  swing away []  fixed []  lift off  []  heavy duty  []  adjustable knee angle  []  adjustable calf panel   []  longer extension tube              []  provide LE support  []  maintain placement of feet on      footplate   []  accommodate  lower leg length  []  accommodate to hamstring       tightness  []  enable transfers  []  provide change in position for LE's  []  elevate legs during recline    []  decrease edema  []  durability      Foot support   []  footplate []  R []  L []  flip up           []  Depth adjustable   []  angle adjustable  []  foot board/one piece    []  provide foot support  []  accommodate to ankle ROM  []  allow foot to go under wheelchair base  []  enable transfers     []  Shoe holders  []  position foot    []  decrease / manage spasticity  []  control position of LE  []  stability    []  safety     []  Ankle strap/heel      loops  []  support foot on foot support  []  decrease extraneous movement  []  provide input to heel   []  protect foot     []  Amputee adapter []  R  []  L     Style:                  Size:  []  Provide support for stump/residual extremity     []  Transportation tie-down  []  to provide crash tested tie-down brackets    []  Crutch/cane holder    []  O2 holder    []  IV hanger   []  Ventilator tray/mount    []  stabilize accessory on wheelchair       Component  Justification     []  Seat cushion      []  accommodate impaired sensation  []  decubitus ulcers present or history  []  unable to shift weight  []  increase pressure distribution  []  prevent pelvic extension  []  custom required off-the-shelf    seat cushion will not accommodate deformity  []  stabilize/promote pelvis alignment  []  stabilize/promote femur alignment  []  accommodate obliquity  []  accommodate multiple deformity  []  incontinent/accidents  []  low maintenance     []  seat mounts                 []  fixed []  removable  []  attach seat platform/cushion to wheelchair frame    []  Seat wedge    []  provide increased aggressiveness of seat shape to decrease sliding  down in the seat  []  accommodate ROM        []  Cover replacement   []  protect back or seat cushion  []  incontinent/accidents    []  Solid seat / insert    []  support cushion to prevent      hammocking  []  allows attachment of cushion to mobility base    []  Lateral pelvic/thigh/hip     support (Guides)     []  decrease abduction  []  accommodate pelvis  []  position upper legs  []  accommodate spasticity  []  removable for transfers     []  Lateral pelvic/thigh      supports mounts  []  fixed   []  swing-away   []  removable  []  mounts lateral pelvic/thigh supports     []  mounts lateral pelvic/thigh supports swing-away or removable for transfers    []  Medial thigh support (Pommel)  [] decrease adduction  [] accommodate ROM  []  remove for transfers   []  alignment      []  Medial thigh   []  fixed      support mounts      []  swing-away   []   removable  []  mounts medial thigh supports   []  Mounts medial supports swing- away or removable for transfers       Component  Justification   []  Back       []  provide  posterior trunk support []  facilitate tone  []  provide lumbar/sacral support []  accommodate deformity  []  support trunk in midline   []  custom required off-the-shelf back support will not accommodate deformity   []  provide lateral trunk support []  accommodate or decrease tone            []  Back mounts  []  fixed  []  removable  []  attach back rest/cushion to wheelchair frame   []  Lateral trunk      supports  []  R []  L  []  decrease lateral trunk leaning  []  accommodate asymmetry    []  contour for increased contact  []  safety    []  control of tone    []  Lateral trunk      supports mounts  []  fixed  []  swing-away   []  removable  []  mounts lateral trunk supports     []  Mounts lateral trunk supports swing-away or removable for transfers   []  Anterior chest      strap, vest     []  decrease forward movement of shoulder  []  decrease forward movement of trunk  []  safety/stability  []  added abdominal support  []  trunk alignment  []  assistance with shoulder control   []  decrease shoulder elevation    []  Headrest      []  provide posterior head support  []  provide posterior neck support  []  provide lateral head support  []  provide anterior head support  []  support during tilt and recline  []  improve feeding     []  improve respiration  []  placement of switches  []  safety    []  accommodate ROM   []  accommodate tone  []  improve visual orientation   []  Headrest           []  fixed []  removable []  flip down      Mounting hardware   []  swing-away laterals/switches  []  mount headrest   []  mounts headrest flip down or  removable for transfers  []  mount headrest swing-away laterals   []  mount switches     []  Neck Support    []  decrease neck rotation  []  decrease forward neck flexion   Pelvic Positioner    []  std hip belt          []  padded hip belt  []  dual pull hip belt  []  four point hip belt  []  stabilize tone  []  decrease falling out of chair  []  prevent excessive extension  []   special pull angle to control      rotation  []  pad for protection over boney   prominence  []  promote comfort    []  Essential needs        bag/pouch   []  medicines []  special food rorthotics []  clothing changes  []  diapers  []  catheter/hygiene []  ostomy supplies   The above equipment has a life- long use expectancy.  Growth and changes in medical and/or functional conditions would be the exceptions.   SUMMARY:  Why mobility device was selected; include why a lower level device is not appropriate: ***  ASSESSMENT:  CLINICAL IMPRESSION: Patient is a 79 y.o. female who was seen today for physical therapy evaluation and treatment for power mobility device. Patient has PMH that is significant for HTN, HLD, nicotine   use disorder, peripheral neuropathy, HIV, CHF, COPD, iron deficiency anemia, AVM with GI blood loss anemia.   OBJECTIVE IMPAIRMENTS {opptimpairments:25111}.   ACTIVITY LIMITATIONS {activitylimitations:27494}  PARTICIPATION LIMITATIONS: {participationrestrictions:25113}  PERSONAL FACTORS {Personal factors:25162} are also affecting patient's functional outcome.   REHAB POTENTIAL: {rehabpotential:25112}  CLINICAL DECISION MAKING: {clinical decision making:25114}  EVALUATION COMPLEXITY: {Evaluation complexity:25115}                                   GOALS: One time visit. No goals established.    PLAN: PT FREQUENCY: one time visit    Raj LOISE Blanch, PT 01/05/2025, 7:09 AM    I concur with the above findings and recommendations of the therapist:  Physician name printed:         Physician's signature:      Date:      "

## 2025-01-25 ENCOUNTER — Encounter: Admitting: Infectious Diseases

## 2025-02-01 ENCOUNTER — Encounter: Admitting: Infectious Diseases

## 2025-02-16 ENCOUNTER — Ambulatory Visit: Payer: Self-pay

## 2025-05-18 ENCOUNTER — Ambulatory Visit: Payer: Self-pay

## 2025-08-17 ENCOUNTER — Ambulatory Visit: Payer: Self-pay

## 2025-11-16 ENCOUNTER — Ambulatory Visit: Payer: Self-pay

## 2026-02-15 ENCOUNTER — Ambulatory Visit: Payer: Self-pay
